# Patient Record
Sex: Female | Born: 1960 | Race: Black or African American | Hispanic: No | Smoking: Current every day smoker
Health system: Southern US, Community
[De-identification: ages and names within clinical notes are randomized; demographics above are authoritative.]

## PROBLEM LIST (undated history)

## (undated) DIAGNOSIS — M75100 Unspecified rotator cuff tear or rupture of unspecified shoulder, not specified as traumatic: Secondary | ICD-10-CM

## (undated) DIAGNOSIS — I10 Essential (primary) hypertension: Secondary | ICD-10-CM

## (undated) DIAGNOSIS — J309 Allergic rhinitis, unspecified: Secondary | ICD-10-CM

## (undated) DIAGNOSIS — I639 Cerebral infarction, unspecified: Secondary | ICD-10-CM

## (undated) DIAGNOSIS — E785 Hyperlipidemia, unspecified: Secondary | ICD-10-CM

## (undated) DIAGNOSIS — M545 Low back pain, unspecified: Secondary | ICD-10-CM

## (undated) DIAGNOSIS — M7660 Achilles tendinitis, unspecified leg: Secondary | ICD-10-CM

## (undated) DIAGNOSIS — M722 Plantar fascial fibromatosis: Secondary | ICD-10-CM

## (undated) DIAGNOSIS — M7710 Lateral epicondylitis, unspecified elbow: Secondary | ICD-10-CM

## (undated) DIAGNOSIS — J45909 Unspecified asthma, uncomplicated: Secondary | ICD-10-CM

## (undated) DIAGNOSIS — N200 Calculus of kidney: Secondary | ICD-10-CM

## (undated) DIAGNOSIS — M419 Scoliosis, unspecified: Secondary | ICD-10-CM

## (undated) DIAGNOSIS — Z87442 Personal history of urinary calculi: Secondary | ICD-10-CM

## (undated) DIAGNOSIS — F439 Reaction to severe stress, unspecified: Secondary | ICD-10-CM

## (undated) DIAGNOSIS — L309 Dermatitis, unspecified: Secondary | ICD-10-CM

## (undated) DIAGNOSIS — M5430 Sciatica, unspecified side: Secondary | ICD-10-CM

## (undated) DIAGNOSIS — N189 Chronic kidney disease, unspecified: Secondary | ICD-10-CM

## (undated) DIAGNOSIS — G459 Transient cerebral ischemic attack, unspecified: Secondary | ICD-10-CM

## (undated) DIAGNOSIS — IMO0002 Reserved for concepts with insufficient information to code with codable children: Secondary | ICD-10-CM

## (undated) DIAGNOSIS — K219 Gastro-esophageal reflux disease without esophagitis: Secondary | ICD-10-CM

## (undated) DIAGNOSIS — F32A Depression, unspecified: Secondary | ICD-10-CM

## (undated) DIAGNOSIS — F419 Anxiety disorder, unspecified: Secondary | ICD-10-CM

## (undated) DIAGNOSIS — I209 Angina pectoris, unspecified: Secondary | ICD-10-CM

## (undated) DIAGNOSIS — T7840XA Allergy, unspecified, initial encounter: Secondary | ICD-10-CM

## (undated) HISTORY — DX: Reserved for concepts with insufficient information to code with codable children: IMO0002

## (undated) HISTORY — DX: Low back pain: M54.5

## (undated) HISTORY — DX: Transient cerebral ischemic attack, unspecified: G45.9

## (undated) HISTORY — DX: Anxiety disorder, unspecified: F41.9

## (undated) HISTORY — DX: Essential (primary) hypertension: I10

## (undated) HISTORY — DX: Dermatitis, unspecified: L30.9

## (undated) HISTORY — DX: Gastro-esophageal reflux disease without esophagitis: K21.9

## (undated) HISTORY — DX: Sciatica, unspecified side: M54.30

## (undated) HISTORY — DX: Chronic kidney disease, unspecified: N18.9

## (undated) HISTORY — DX: Low back pain, unspecified: M54.50

## (undated) HISTORY — DX: Allergic rhinitis, unspecified: J30.9

## (undated) HISTORY — PX: TUBAL LIGATION: SHX77

## (undated) HISTORY — DX: Unspecified rotator cuff tear or rupture of unspecified shoulder, not specified as traumatic: M75.100

## (undated) HISTORY — DX: Depression, unspecified: F32.A

## (undated) HISTORY — DX: Calculus of kidney: N20.0

## (undated) HISTORY — DX: Unspecified asthma, uncomplicated: J45.909

## (undated) HISTORY — DX: Lateral epicondylitis, unspecified elbow: M77.10

## (undated) HISTORY — DX: Achilles tendinitis, unspecified leg: M76.60

## (undated) HISTORY — DX: Allergy, unspecified, initial encounter: T78.40XA

## (undated) HISTORY — DX: Hyperlipidemia, unspecified: E78.5

## (undated) HISTORY — DX: Scoliosis, unspecified: M41.9

## (undated) HISTORY — DX: Reaction to severe stress, unspecified: F43.9

## (undated) HISTORY — PX: KIDNEY SURGERY: SHX687

## (undated) HISTORY — DX: Plantar fascial fibromatosis: M72.2

---

## 2000-02-15 ENCOUNTER — Emergency Department (HOSPITAL_COMMUNITY): Admission: EM | Admit: 2000-02-15 | Discharge: 2000-02-15 | Payer: Self-pay | Admitting: Emergency Medicine

## 2001-12-08 ENCOUNTER — Emergency Department (HOSPITAL_COMMUNITY): Admission: EM | Admit: 2001-12-08 | Discharge: 2001-12-08 | Payer: Self-pay | Admitting: Emergency Medicine

## 2003-05-14 ENCOUNTER — Emergency Department (HOSPITAL_COMMUNITY): Admission: AD | Admit: 2003-05-14 | Discharge: 2003-05-14 | Payer: Self-pay | Admitting: Family Medicine

## 2003-11-08 ENCOUNTER — Emergency Department (HOSPITAL_COMMUNITY): Admission: EM | Admit: 2003-11-08 | Discharge: 2003-11-08 | Payer: Self-pay | Admitting: Emergency Medicine

## 2004-06-05 ENCOUNTER — Emergency Department (HOSPITAL_COMMUNITY): Admission: EM | Admit: 2004-06-05 | Discharge: 2004-06-05 | Payer: Self-pay | Admitting: Emergency Medicine

## 2005-03-05 ENCOUNTER — Emergency Department (HOSPITAL_COMMUNITY): Admission: EM | Admit: 2005-03-05 | Discharge: 2005-03-05 | Payer: Self-pay | Admitting: Emergency Medicine

## 2005-05-13 ENCOUNTER — Emergency Department (HOSPITAL_COMMUNITY): Admission: EM | Admit: 2005-05-13 | Discharge: 2005-05-13 | Payer: Self-pay | Admitting: Family Medicine

## 2006-03-29 ENCOUNTER — Emergency Department (HOSPITAL_COMMUNITY): Admission: EM | Admit: 2006-03-29 | Discharge: 2006-03-29 | Payer: Self-pay | Admitting: Family Medicine

## 2006-04-03 ENCOUNTER — Emergency Department (HOSPITAL_COMMUNITY): Admission: EM | Admit: 2006-04-03 | Discharge: 2006-04-03 | Payer: Self-pay | Admitting: Family Medicine

## 2006-04-27 ENCOUNTER — Encounter: Admission: RE | Admit: 2006-04-27 | Discharge: 2006-04-27 | Payer: Self-pay | Admitting: General Practice

## 2006-05-16 ENCOUNTER — Ambulatory Visit: Payer: Self-pay | Admitting: Gastroenterology

## 2006-06-07 ENCOUNTER — Encounter (INDEPENDENT_AMBULATORY_CARE_PROVIDER_SITE_OTHER): Payer: Self-pay | Admitting: Specialist

## 2006-06-07 ENCOUNTER — Ambulatory Visit: Payer: Self-pay | Admitting: Gastroenterology

## 2006-06-07 LAB — HM COLONOSCOPY

## 2006-07-11 ENCOUNTER — Ambulatory Visit: Payer: Self-pay | Admitting: Gastroenterology

## 2007-05-06 ENCOUNTER — Emergency Department (HOSPITAL_COMMUNITY): Admission: EM | Admit: 2007-05-06 | Discharge: 2007-05-06 | Payer: Self-pay | Admitting: Emergency Medicine

## 2007-06-15 ENCOUNTER — Ambulatory Visit (HOSPITAL_COMMUNITY): Admission: RE | Admit: 2007-06-15 | Discharge: 2007-06-15 | Payer: Self-pay | Admitting: Chiropractic Medicine

## 2007-06-28 ENCOUNTER — Encounter: Admission: RE | Admit: 2007-06-28 | Discharge: 2007-06-28 | Payer: Self-pay | Admitting: Internal Medicine

## 2007-06-28 ENCOUNTER — Ambulatory Visit (HOSPITAL_COMMUNITY): Admission: RE | Admit: 2007-06-28 | Discharge: 2007-06-28 | Payer: Self-pay | Admitting: Chiropractic Medicine

## 2007-07-12 ENCOUNTER — Emergency Department (HOSPITAL_COMMUNITY): Admission: EM | Admit: 2007-07-12 | Discharge: 2007-07-12 | Payer: Self-pay | Admitting: Emergency Medicine

## 2007-07-12 DIAGNOSIS — K219 Gastro-esophageal reflux disease without esophagitis: Secondary | ICD-10-CM | POA: Insufficient documentation

## 2007-07-12 DIAGNOSIS — J309 Allergic rhinitis, unspecified: Secondary | ICD-10-CM | POA: Insufficient documentation

## 2007-07-12 DIAGNOSIS — G8929 Other chronic pain: Secondary | ICD-10-CM | POA: Insufficient documentation

## 2007-07-12 DIAGNOSIS — M545 Low back pain: Secondary | ICD-10-CM

## 2007-07-12 DIAGNOSIS — I10 Essential (primary) hypertension: Secondary | ICD-10-CM | POA: Insufficient documentation

## 2007-07-12 DIAGNOSIS — Z87442 Personal history of urinary calculi: Secondary | ICD-10-CM | POA: Insufficient documentation

## 2007-07-17 ENCOUNTER — Emergency Department (HOSPITAL_COMMUNITY): Admission: EM | Admit: 2007-07-17 | Discharge: 2007-07-17 | Payer: Self-pay | Admitting: Emergency Medicine

## 2007-09-19 ENCOUNTER — Encounter (INDEPENDENT_AMBULATORY_CARE_PROVIDER_SITE_OTHER): Payer: Self-pay | Admitting: *Deleted

## 2007-09-19 ENCOUNTER — Ambulatory Visit: Payer: Self-pay | Admitting: Internal Medicine

## 2007-09-19 DIAGNOSIS — M79609 Pain in unspecified limb: Secondary | ICD-10-CM | POA: Insufficient documentation

## 2007-09-19 LAB — CONVERTED CEMR LAB
ALT: 17 units/L (ref 0–35)
AST: 20 units/L (ref 0–37)
Albumin: 3.9 g/dL (ref 3.5–5.2)
Alkaline Phosphatase: 100 units/L (ref 39–117)
BUN: 15 mg/dL (ref 6–23)
Basophils Absolute: 0 10*3/uL (ref 0.0–0.1)
Basophils Relative: 0.1 % (ref 0.0–3.0)
Bilirubin Urine: NEGATIVE
Bilirubin, Direct: 0.1 mg/dL (ref 0.0–0.3)
CO2: 26 meq/L (ref 19–32)
Calcium: 9.5 mg/dL (ref 8.4–10.5)
Chloride: 109 meq/L (ref 96–112)
Cholesterol: 160 mg/dL (ref 0–200)
Creatinine, Ser: 0.7 mg/dL (ref 0.4–1.2)
Eosinophils Absolute: 0.1 10*3/uL (ref 0.0–0.7)
Eosinophils Relative: 1.8 % (ref 0.0–5.0)
Folate: 14.9 ng/mL
GFR calc Af Amer: 115 mL/min
GFR calc non Af Amer: 95 mL/min
Glucose, Bld: 95 mg/dL (ref 70–99)
HCT: 40.2 % (ref 36.0–46.0)
HDL: 34.4 mg/dL — ABNORMAL LOW (ref >39.0)
Hemoglobin, Urine: NEGATIVE
Hemoglobin: 13.6 g/dL (ref 12.0–15.0)
Ketones, ur: NEGATIVE mg/dL
LDL Cholesterol: 88 mg/dL (ref 0–99)
Leukocytes, UA: NEGATIVE
Lymphocytes Relative: 41.2 % (ref 12.0–46.0)
MCHC: 33.8 g/dL (ref 30.0–36.0)
MCV: 87.7 fL (ref 78.0–100.0)
Monocytes Absolute: 0.3 10*3/uL (ref 0.1–1.0)
Monocytes Relative: 4.3 % (ref 3.0–12.0)
Neutro Abs: 3.1 10*3/uL (ref 1.4–7.7)
Neutrophils Relative %: 52.6 % (ref 43.0–77.0)
Nitrite: NEGATIVE
Platelets: 139 10*3/uL — ABNORMAL LOW (ref 150–400)
Potassium: 3.4 meq/L — ABNORMAL LOW (ref 3.5–5.1)
RBC: 4.58 M/uL (ref 3.87–5.11)
RDW: 13.7 % (ref 11.5–14.6)
Sodium: 141 meq/L (ref 135–145)
Specific Gravity, Urine: 1.015 (ref 1.000–1.03)
TSH: 0.37 microintl units/mL (ref 0.35–5.50)
Total Bilirubin: 0.5 mg/dL (ref 0.3–1.2)
Total CHOL/HDL Ratio: 4.7
Total Protein, Urine: NEGATIVE mg/dL
Total Protein: 6.9 g/dL (ref 6.0–8.3)
Triglycerides: 189 mg/dL — ABNORMAL HIGH (ref 0–149)
Urine Glucose: NEGATIVE mg/dL
Urobilinogen, UA: 0.2 (ref 0.0–1.0)
VLDL: 38 mg/dL (ref 0–40)
Vitamin B-12: 376 pg/mL (ref 211–911)
WBC: 6 10*3/uL (ref 4.5–10.5)
pH: 6 (ref 5.0–8.0)

## 2007-09-20 LAB — CONVERTED CEMR LAB: Vit D, 1,25-Dihydroxy: 29 — ABNORMAL LOW (ref 30–89)

## 2007-09-25 ENCOUNTER — Encounter: Payer: Self-pay | Admitting: Internal Medicine

## 2008-08-18 ENCOUNTER — Ambulatory Visit: Payer: Self-pay | Admitting: Internal Medicine

## 2008-08-18 DIAGNOSIS — N951 Menopausal and female climacteric states: Secondary | ICD-10-CM | POA: Insufficient documentation

## 2008-08-18 DIAGNOSIS — M722 Plantar fascial fibromatosis: Secondary | ICD-10-CM | POA: Insufficient documentation

## 2008-08-18 DIAGNOSIS — M7710 Lateral epicondylitis, unspecified elbow: Secondary | ICD-10-CM | POA: Insufficient documentation

## 2008-08-26 ENCOUNTER — Telehealth (INDEPENDENT_AMBULATORY_CARE_PROVIDER_SITE_OTHER): Payer: Self-pay | Admitting: *Deleted

## 2008-09-01 ENCOUNTER — Ambulatory Visit: Payer: Self-pay | Admitting: Internal Medicine

## 2008-09-01 DIAGNOSIS — M75100 Unspecified rotator cuff tear or rupture of unspecified shoulder, not specified as traumatic: Secondary | ICD-10-CM | POA: Insufficient documentation

## 2008-09-01 DIAGNOSIS — M7660 Achilles tendinitis, unspecified leg: Secondary | ICD-10-CM | POA: Insufficient documentation

## 2008-09-01 DIAGNOSIS — IMO0002 Reserved for concepts with insufficient information to code with codable children: Secondary | ICD-10-CM | POA: Insufficient documentation

## 2008-09-10 ENCOUNTER — Ambulatory Visit: Payer: Self-pay | Admitting: Internal Medicine

## 2008-09-10 DIAGNOSIS — N760 Acute vaginitis: Secondary | ICD-10-CM | POA: Insufficient documentation

## 2008-09-10 DIAGNOSIS — M25476 Effusion, unspecified foot: Secondary | ICD-10-CM

## 2008-09-10 DIAGNOSIS — M25529 Pain in unspecified elbow: Secondary | ICD-10-CM | POA: Insufficient documentation

## 2008-09-10 DIAGNOSIS — M25473 Effusion, unspecified ankle: Secondary | ICD-10-CM | POA: Insufficient documentation

## 2008-09-10 LAB — CONVERTED CEMR LAB
ALT: 19 units/L (ref 0–35)
AST: 17 units/L (ref 0–37)
Albumin: 3.9 g/dL (ref 3.5–5.2)
Alkaline Phosphatase: 95 units/L (ref 39–117)
BUN: 16 mg/dL (ref 6–23)
Basophils Absolute: 0.1 10*3/uL (ref 0.0–0.1)
Basophils Relative: 1.1 % (ref 0.0–3.0)
Bilirubin Urine: NEGATIVE
Bilirubin, Direct: 0.1 mg/dL (ref 0.0–0.3)
CO2: 29 meq/L (ref 19–32)
Calcium: 9.2 mg/dL (ref 8.4–10.5)
Chloride: 107 meq/L (ref 96–112)
Cholesterol: 167 mg/dL (ref 0–200)
Creatinine, Ser: 0.7 mg/dL (ref 0.4–1.2)
Eosinophils Absolute: 0.2 10*3/uL (ref 0.0–0.7)
Eosinophils Relative: 2.8 % (ref 0.0–5.0)
GFR calc non Af Amer: 114.52 mL/min (ref >60)
Glucose, Bld: 74 mg/dL (ref 70–99)
HCT: 44.3 % (ref 36.0–46.0)
HDL: 46.9 mg/dL (ref >39.00)
Hemoglobin, Urine: NEGATIVE
Hemoglobin: 14.7 g/dL (ref 12.0–15.0)
Ketones, ur: NEGATIVE mg/dL
LDL Cholesterol: 93 mg/dL (ref 0–99)
Leukocytes, UA: NEGATIVE
Lymphocytes Relative: 45.8 % (ref 12.0–46.0)
Lymphs Abs: 2.9 10*3/uL (ref 0.7–4.0)
MCHC: 33.1 g/dL (ref 30.0–36.0)
MCV: 89.9 fL (ref 78.0–100.0)
Monocytes Absolute: 0.7 10*3/uL (ref 0.1–1.0)
Monocytes Relative: 11.2 % (ref 3.0–12.0)
Neutro Abs: 2.5 10*3/uL (ref 1.4–7.7)
Neutrophils Relative %: 39.1 % — ABNORMAL LOW (ref 43.0–77.0)
Nitrite: NEGATIVE
Platelets: 112 10*3/uL — ABNORMAL LOW (ref 150.0–400.0)
Potassium: 4.1 meq/L (ref 3.5–5.1)
RBC: 4.92 M/uL (ref 3.87–5.11)
RDW: 14.9 % — ABNORMAL HIGH (ref 11.5–14.6)
Rheumatoid fact SerPl-aCnc: <20 intl units/mL (ref 0.0–20.0)
Sed Rate: 4 mm/hr (ref 0–22)
Sodium: 141 meq/L (ref 135–145)
Specific Gravity, Urine: >=1.03 (ref 1.000–1.030)
TSH: 1.02 microintl units/mL (ref 0.35–5.50)
Total Bilirubin: 0.9 mg/dL (ref 0.3–1.2)
Total CHOL/HDL Ratio: 4
Total Protein, Urine: NEGATIVE mg/dL
Total Protein: 7 g/dL (ref 6.0–8.3)
Triglycerides: 134 mg/dL (ref 0.0–149.0)
Urine Glucose: NEGATIVE mg/dL
Urobilinogen, UA: 0.2 (ref 0.0–1.0)
VLDL: 26.8 mg/dL (ref 0.0–40.0)
WBC: 6.4 10*3/uL (ref 4.5–10.5)
pH: 5 (ref 5.0–8.0)

## 2008-09-16 LAB — CONVERTED CEMR LAB
Anti Nuclear Antibody(ANA): NEGATIVE
Vit D, 25-Hydroxy: 28 ng/mL — ABNORMAL LOW (ref 30–89)

## 2008-10-15 ENCOUNTER — Telehealth: Payer: Self-pay | Admitting: Internal Medicine

## 2008-10-19 ENCOUNTER — Ambulatory Visit: Payer: Self-pay | Admitting: Internal Medicine

## 2008-10-19 DIAGNOSIS — N390 Urinary tract infection, site not specified: Secondary | ICD-10-CM | POA: Insufficient documentation

## 2008-10-19 DIAGNOSIS — M25579 Pain in unspecified ankle and joints of unspecified foot: Secondary | ICD-10-CM | POA: Insufficient documentation

## 2008-10-24 ENCOUNTER — Emergency Department (HOSPITAL_COMMUNITY): Admission: EM | Admit: 2008-10-24 | Discharge: 2008-10-24 | Payer: Self-pay | Admitting: Emergency Medicine

## 2008-10-29 ENCOUNTER — Telehealth: Payer: Self-pay | Admitting: Internal Medicine

## 2008-11-10 ENCOUNTER — Telehealth: Payer: Self-pay | Admitting: Internal Medicine

## 2008-11-11 ENCOUNTER — Ambulatory Visit: Payer: Self-pay | Admitting: Internal Medicine

## 2008-11-17 ENCOUNTER — Encounter: Payer: Self-pay | Admitting: Internal Medicine

## 2008-11-27 ENCOUNTER — Emergency Department (HOSPITAL_COMMUNITY): Admission: EM | Admit: 2008-11-27 | Discharge: 2008-11-27 | Payer: Self-pay | Admitting: Emergency Medicine

## 2009-02-07 ENCOUNTER — Emergency Department (HOSPITAL_COMMUNITY): Admission: EM | Admit: 2009-02-07 | Discharge: 2009-02-07 | Payer: Self-pay | Admitting: Emergency Medicine

## 2009-10-31 ENCOUNTER — Emergency Department (HOSPITAL_COMMUNITY): Admission: EM | Admit: 2009-10-31 | Discharge: 2009-10-31 | Payer: Self-pay | Admitting: Family Medicine

## 2009-11-01 ENCOUNTER — Emergency Department (HOSPITAL_COMMUNITY): Admission: EM | Admit: 2009-11-01 | Discharge: 2009-11-01 | Payer: Self-pay | Admitting: Emergency Medicine

## 2009-11-02 ENCOUNTER — Emergency Department (HOSPITAL_COMMUNITY): Admission: EM | Admit: 2009-11-02 | Discharge: 2009-11-02 | Payer: Self-pay | Admitting: Emergency Medicine

## 2009-11-03 ENCOUNTER — Encounter: Payer: Self-pay | Admitting: Internal Medicine

## 2009-11-08 ENCOUNTER — Ambulatory Visit: Payer: Self-pay | Admitting: Internal Medicine

## 2009-11-08 ENCOUNTER — Encounter (INDEPENDENT_AMBULATORY_CARE_PROVIDER_SITE_OTHER): Payer: Self-pay | Admitting: *Deleted

## 2009-11-08 DIAGNOSIS — N200 Calculus of kidney: Secondary | ICD-10-CM | POA: Insufficient documentation

## 2009-11-08 DIAGNOSIS — R350 Frequency of micturition: Secondary | ICD-10-CM | POA: Insufficient documentation

## 2009-11-09 ENCOUNTER — Encounter: Payer: Self-pay | Admitting: Internal Medicine

## 2009-11-09 LAB — CONVERTED CEMR LAB
Bilirubin Urine: NEGATIVE
Ketones, ur: NEGATIVE mg/dL
Nitrite: NEGATIVE
Specific Gravity, Urine: 1.01 (ref 1.000–1.030)
Total Protein, Urine: NEGATIVE mg/dL
Urine Glucose: NEGATIVE mg/dL
Urobilinogen, UA: 0.2 (ref 0.0–1.0)
pH: 6.5 (ref 5.0–8.0)

## 2009-11-10 ENCOUNTER — Encounter (INDEPENDENT_AMBULATORY_CARE_PROVIDER_SITE_OTHER): Payer: Self-pay | Admitting: *Deleted

## 2009-11-16 ENCOUNTER — Telehealth: Payer: Self-pay | Admitting: Gastroenterology

## 2009-11-17 ENCOUNTER — Encounter: Payer: Self-pay | Admitting: Internal Medicine

## 2009-11-19 ENCOUNTER — Ambulatory Visit (HOSPITAL_BASED_OUTPATIENT_CLINIC_OR_DEPARTMENT_OTHER): Admission: RE | Admit: 2009-11-19 | Discharge: 2009-11-19 | Payer: Self-pay | Admitting: Urology

## 2009-11-21 ENCOUNTER — Emergency Department (HOSPITAL_COMMUNITY): Admission: EM | Admit: 2009-11-21 | Discharge: 2009-11-21 | Payer: Self-pay | Admitting: Emergency Medicine

## 2009-11-22 ENCOUNTER — Telehealth: Payer: Self-pay | Admitting: Internal Medicine

## 2009-11-23 ENCOUNTER — Encounter: Payer: Self-pay | Admitting: Gastroenterology

## 2009-11-23 ENCOUNTER — Telehealth: Payer: Self-pay | Admitting: Gastroenterology

## 2009-11-27 ENCOUNTER — Emergency Department (HOSPITAL_COMMUNITY): Admission: EM | Admit: 2009-11-27 | Discharge: 2009-11-27 | Payer: Self-pay | Admitting: Emergency Medicine

## 2010-01-05 ENCOUNTER — Ambulatory Visit: Payer: Self-pay | Admitting: Internal Medicine

## 2010-01-05 DIAGNOSIS — R300 Dysuria: Secondary | ICD-10-CM | POA: Insufficient documentation

## 2010-01-05 LAB — CONVERTED CEMR LAB
Bilirubin Urine: NEGATIVE
Glucose, Urine, Semiquant: NEGATIVE
Ketones, urine, test strip: NEGATIVE
Nitrite: NEGATIVE
Protein, U semiquant: NEGATIVE
Specific Gravity, Urine: 1.015
Urobilinogen, UA: 0.2
WBC Urine, dipstick: NEGATIVE
pH: 5

## 2010-03-29 NOTE — Progress Notes (Signed)
Summary: Triage   Phone Note Call from Patient Call back at Home Phone [phone]   Caller: Patient Call For: Dr. Russella Dar Summary of Call: Low back pain, constipation, nauseated x1 mth Initial call taken by: Karna Christmas,  November 16, 2009 9:16 AM  Follow-up for Phone Call        Patient  taking oxycodone q 4-6 hours due to kidney stones, she has follow up with her Urologist in the next few weeks to decide if she will need surgery due to the size of the 2 stones.  She is having constipation and lower abdominal discomfort since being on oxycodone.  She was started on Lactulose by Dr Jonny Ruiz for constipation and it gives her minimal relief.  She is advised that she can try Miralax 1-3 times a day as needed. She will call back once she is off oxycodone if her constiptation persists for a follow up appointment with Dr Russella Dar. Follow-up by: Darcey Nora RN, CGRN,  November 16, 2009 9:30 AM  Additional Follow-up for Phone Call Additional follow up Details #1::        Agree with above. Additional Follow-up by: Meryl Dare MD Clementeen Graham,  November 16, 2009 8:15 PM

## 2010-03-29 NOTE — Assessment & Plan Note (Signed)
Summary: 2 wk f/u $50/cd   Vital Signs:  Patient profile:   50 year old female Height:      62 inches Weight:      124.13 pounds BMI:     22.79 O2 Sat:      98 % Temp:     98.9 degrees F oral Pulse rate:   87 / minute BP sitting:   150 / 106  (right arm) Cuff size:   regular  Vitals Entered By: Windell Norfolk (September 01, 2008 11:19 AM) CC: 2 week f/u on foot   CC:  2 week f/u on foot.  History of Present Illness: here unfortulately as the meloxicam helped somewhat, but just not enough as she has persistent pain to the right heel, right achilles insertion site at the heel, and also today to the RUE , both at the shoulder and the elbow; again denies overuse, fever , trauma or significant swelling today  Problems Prior to Update: 1)  Pain in Joint, Upper Arm  (ICD-719.42) 2)  Joint Effusion, Ankle  (ICD-719.07) 3)  Preventive Health Care  (ICD-V70.0) 4)  Vaginitis  (ICD-616.10) 5)  Achilles Tendinitis  (ICD-726.71) 6)  Subacromial Bursitis, Right  (ICD-726.19) 7)  Hot Flashes  (ICD-627.2) 8)  Plantar Fasciitis, Right  (ICD-728.71) 9)  Lateral Epicondylitis, Right  (ICD-726.32) 10)  Preventive Health Care  (ICD-V70.0) 11)  Foot Pain, Bilateral  (ICD-729.5) 12)  Arm Pain, Right  (ICD-729.5) 13)  Colonic Polyps, Hx of  (ICD-V12.72) 14)  Family History Diabetes 1st Degree Relative  (ICD-V18.0) 15)  Family History Breast Cancer 1st Degree Relative <50  (ICD-V16.3) 16)  Low Back Pain  (ICD-724.2) 17)  Nephrolithiasis, Hx of  (ICD-V13.01) 18)  Hypertension  (ICD-401.9) 19)  Gerd  (ICD-530.81) 20)  Allergic Rhinitis  (ICD-477.9)  Medications Prior to Update: 1)  Lisinopril 20 Mg  Tabs (Lisinopril) .... Take 1 Tablet By Mouth Once A Day 2)  Meloxicam 15 Mg Tabs (Meloxicam) .Marland Kitchen.. 1 By Mouth Once Daily As Needed Pain 3)  Cetirizine Hcl 10 Mg Tabs (Cetirizine Hcl) .Marland Kitchen.. 1 By Mouth Once Daily As Needed 4)  Sertraline Hcl 50 Mg Tabs (Sertraline Hcl) .Marland Kitchen.. 1 By Mouth Once Daily  Current  Medications (verified): 1)  Lisinopril 20 Mg  Tabs (Lisinopril) .... Take 1 Tablet By Mouth Once A Day 2)  Cetirizine Hcl 10 Mg Tabs (Cetirizine Hcl) .Marland Kitchen.. 1 By Mouth Once Daily As Needed 3)  Sertraline Hcl 50 Mg Tabs (Sertraline Hcl) .Marland Kitchen.. 1 By Mouth Once Daily 4)  Prednisone 10 Mg Tabs (Prednisone) .... 3po Qd For 3days, Then 2po Qd For 3days, Then 1po Qd For 3days, Then Stop 5)  Tramadol Hcl 50 Mg Tabs (Tramadol Hcl) .Marland Kitchen.. 1 - 2 By Mouth Four Times Per Day As Needed  Allergies (verified): 1)  ! 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   Head:  normocephalic and atraumatic.   Eyes:  vision grossly intact and pupils reactive to light.   Ears:  R ear normal and L ear normal.  Nose:  no external deformity and no nasal discharge.   Mouth:  no gingival abnormalities and pharynx pink and moist.   Neck:  supple and no masses.   Lungs:  normal respiratory effort and normal breath sounds.   Heart:  normal rate and regular rhythm.   Msk:  has specific mod tenderness to the right subacromail bursa area with impingement sign on the right, mild to mod tenderness to the right lateral epicondylar area without sweling/erythema and has FROM; also tender mild to the right achilles insertion site without significant soft tissue or other sweling to the area, and also tender to the right plantar heel again without erythem, sweling or ulcer Extremities:  no edema, no ulcers  Neurologic:  cranial nerves II-XII intact  and strength normal in all extremities., o/w not done in detail   Impression & Recommendations:  Problem # 1:  SUBACROMIAL BURSITIS, RIGHT (ICD-726.19)  overall moderate as are the rest of the problems, but mobic 15 mg not helping; will sotp this, tx wtih prednisone burst and taper, ultram as needed , and refer ortho - [facility]  Orders: Orthopedic Surgeon Referral (Ortho Surgeon)  Problem # 2:  LATERAL EPICONDYLITIS, RIGHT (ICD-726.32) treat as above, f/u any worsening signs or symptoms  Orders: Orthopedic Surgeon Referral (Ortho Surgeon)  Problem # 3:  PLANTAR FASCIITIS, RIGHT (ICD-728.71)  The following medications were removed from the medication list:    Meloxicam 15 Mg Tabs (Meloxicam) .Marland Kitchen... 1 by mouth once daily as needed pain  Orders: Orthopedic Surgeon Referral (Ortho Surgeon) treat as above, f/u any worsening signs or symptoms, consider podiatry but should be ok with ortho  Problem # 4:  ACHILLES TENDINITIS (ICD-726.71) treat as above, f/u any worsening signs or symptoms , again d'/w pt this usually occurs in overuse problems and should consider avoiaing standing or walking for long periods for the next 2 to 4 wks Orders: Orthopedic Surgeon Referral (Ortho Surgeon)  Complete Medication List: 1)  Lisinopril 20 Mg Tabs (Lisinopril) .... Take 1 tablet by mouth once a day 2)  Cetirizine Hcl 10 Mg Tabs (Cetirizine hcl) .Marland Kitchen.. 1 by mouth once daily as needed 3)  Sertraline Hcl 50 Mg Tabs (Sertraline hcl) .Marland Kitchen.. 1 by mouth once daily 4)  Prednisone 10 Mg Tabs (Prednisone) .... 3po qd for 3days, then 2po qd for 3days, then 1po qd for 3days, then stop 5)  Tramadol Hcl 50 Mg Tabs (Tramadol hcl) .Marland Kitchen.. 1 - 2 by mouth four times per day as needed  Patient Instructions: 1)  stop the meloxicam 2)  Please take all new medications as prescribed - the ultram and the prednisone 3)  You will be contacted about the referral(s) to: Orthopedic 4)  Continue all medications that you may have been  taking previously  5)  Please schedule a follow-up appointment as needed. Prescriptions: LISINOPRIL 20 MG  TABS (LISINOPRIL) Take 1 tablet by mouth once a day  #90 x 3   Entered by:   Windell Norfolk   Authorized by:   Corwin Levins MD   Signed by:   Windell Norfolk on 09/01/2008   Method used:   Electronically to        [facility] (retail)       [address]       [place], [place]  [address]       Ph: [phone]       Fax: [phone]   RxID:  [id number] TRAMADOL HCL 50 MG TABS (TRAMADOL HCL) 1 - 2 by mouth four times per day as needed  #60 x 2   Entered and Authorized by:   Corwin Levins MD   Signed by:   Corwin Levins MD on 09/01/2008   Method used:   Print then Give to Patient   RxID:   [id number] PREDNISONE 10 MG TABS (PREDNISONE) 3po qd for 3days, then 2po qd for 3days, then 1po qd for 3days, then stop  #18 x 0   Entered and Authorized by:   Corwin Levins MD   Signed by:   Corwin Levins MD on 09/01/2008   Method used:   Print then Give to Patient   RxID:   [id number]

## 2010-03-29 NOTE — Assessment & Plan Note (Signed)
Summary: POST ER VISIT/ KIDNEY STONES? /NWS   Vital Signs:  Patient profile:   50 year old female Height:      62 inches Weight:      120.25 pounds BMI:     22.07 O2 Sat:      97 % on Room air Temp:     98.3 degrees F oral Pulse rate:   78 / minute BP sitting:   130 / 92  (left arm) Cuff size:   regular  Vitals Entered By: Zella Ball Ewing CMA Duncan Dull) (November 08, 2009 3:23 PM)  O2 Flow:  Room air CC: Post ER/RE   CC:  Post ER/RE.  History of Present Illness: here with family, c/o ongoing right flank pain and nausea that persist, constant with fair to good control with current meds  after recent ER evaluation with finding of right renal stones x 2 - one 7 mm and one 6 mm apparentlly unable to pass;  has been seen in f/u per urology with f/u sched for october and med tx given for now;  recent imaging without hydronephrosis.  Pt simply unable to work with the ongoing pain and nausea and family finances depend on her getting back to work.  Also with ongoing constipation adn abd discomfort but not worsening, but  ongoing  despite stool softner, without blood, vomiting, fever or increasing abd pain.  C/o urinary frequency ongoing no change as well, I note urine cx neg sept 5 per hospital echart.  Pt denies CP, worsening sob, doe, wheezing, orthopnea, pnd, worsening LE edema, palps, dizziness or syncope  Pt denies new neuro symptoms such as headache, facial or extremity weakness No  wt loss, night sweats,  or other constitutional symptoms except has some loss of appetitie.  Problems Prior to Update: 1)  Frequency, Urinary  (ICD-788.41) 2)  Plantar Fasciitis, Bilateral  (ICD-728.71) 3)  Neoplasm, Malignant, Colon, Family Hx, Sibling  (ICD-V16.0) 4)  Uti  (ICD-599.0) 5)  Ankle Pain, Bilateral  (ICD-719.47) 6)  Pain in Joint, Upper Arm  (ICD-719.42) 7)  Joint Effusion, Ankle  (ICD-719.07) 8)  Preventive Health Care  (ICD-V70.0) 9)  Vaginitis  (ICD-616.10) 10)  Achilles Tendinitis   (ICD-726.71) 11)  Subacromial Bursitis, Right  (ICD-726.19) 12)  Hot Flashes  (ICD-627.2) 13)  Plantar Fasciitis, Right  (ICD-728.71) 14)  Lateral Epicondylitis, Right  (ICD-726.32) 15)  Preventive Health Care  (ICD-V70.0) 16)  Foot Pain, Bilateral  (ICD-729.5) 17)  Arm Pain, Right  (ICD-729.5) 18)  Colonic Polyps, Hx of  (ICD-V12.72) 19)  Family History Diabetes 1st Degree Relative  (ICD-V18.0) 20)  Family History Breast Cancer 1st Degree Relative <50  (ICD-V16.3) 21)  Low Back Pain  (ICD-724.2) 22)  Nephrolithiasis, Hx of  (ICD-V13.01) 23)  Hypertension  (ICD-401.9) 24)  Gerd  (ICD-530.81) 25)  Allergic Rhinitis  (ICD-477.9)  Medications Prior to Update: 1)  Lisinopril 20 Mg  Tabs (Lisinopril) .... Take 1 Tablet By Mouth Once A Day 2)  Cetirizine Hcl 10 Mg Tabs (Cetirizine Hcl) .Marland Kitchen.. 1 By Mouth Once Daily As Needed 3)  Sertraline Hcl 50 Mg Tabs (Sertraline Hcl) .Marland Kitchen.. 1 By Mouth Once Daily 4)  Tramadol Hcl 50 Mg Tabs (Tramadol Hcl) .Marland Kitchen.. 1 - 2 By Mouth Q 6 Hrs As Needed 5)  Nicotrol 10 Mg Inha (Nicotine) .... Use Asd  Current Medications (verified): 1)  Lisinopril 20 Mg  Tabs (Lisinopril) .... Take 1 Tablet By Mouth Once A Day 2)  Cetirizine Hcl 10 Mg Tabs (Cetirizine Hcl) .Marland KitchenMarland KitchenMarland Kitchen  1 By Mouth Once Daily As Needed 3)  Sertraline Hcl 50 Mg Tabs (Sertraline Hcl) .Marland Kitchen.. 1 By Mouth Once Daily 4)  Tramadol Hcl 50 Mg Tabs (Tramadol Hcl) .Marland Kitchen.. 1 - 2 By Mouth Q 6 Hrs As Needed 5)  Nicotrol 10 Mg Inha (Nicotine) .... Use Asd 6)  Oxycodone Hcl 5 Mg Caps (Oxycodone Hcl) .Marland Kitchen.. 1-2 By Mouth Q 6 Hrs As Needed Pain 7)  Promethazine Hcl 25 Mg Tabs (Promethazine Hcl) .Marland Kitchen.. 1po Q 6 Hrs As Needed Nausea 8)  Lactulose  Soln (Lactulose) .... 30 - 60 Cc By Mouth Two Times A Day As Needed Constipation  Allergies (verified): 1)  ! 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   Head:  normocephalic and atraumatic.   Eyes:  vision grossly intact, pupils equal, and pupils round.   Ears:  R ear normal and L ear normal.   Nose:  no external deformity and no nasal discharge.   Mouth:  no gingival abnormalities and pharynx pink and moist.   Neck:  supple and no masses.   Lungs:  normal respiratory effort and normal breath sounds.   Heart:  normal rate and regular rhythm.   Abdomen:  soft, non-tender, normal bowel sounds, no distention, no masses, and no guarding.   Msk:  no joint tenderness and no joint swelling.  , has ? right flank tender Extremities:  no edema, no erythema    Impression & Recommendations:  Problem # 1:  NEPHROLITHIASIS (ICD-592.0) d/w pt - pain most liekly related to non-passing stones -  Continue all previous medications as before this visit, needs to f/u with urology and i would call to request earlier appt than planned  Problem # 2:  FREQUENCY, URINARY (ICD-788.41) recent cx neg;  but posible to have ? uti - to check urine studies Orders: T-Culture, Urine ([id number]) TLB-Udip w/ Micro (81001-URINE)  Problem # 3:  ABDOMINAL PAIN, GENERALIZED (ICD-789.07) prob stone related, need to r/o UTI , but also for as needed lactulose for constipation,  Problem # 4:  HYPERTENSION (ICD-401.9)  Her updated medication list for this problem includes:    Lisinopril 20 Mg Tabs (Lisinopril) .Marland Kitchen... Take 1 tablet by mouth once a day  BP today: 130/92 Prior BP: 128/96 (11/11/2008)  Labs Reviewed: K+: 4.1 (09/10/2008) Creat: : 0.7 (09/10/2008)   Chol: 167 (09/10/2008)   HDL:  46.90 (09/10/2008)   LDL: 93 (09/10/2008)   TG: 134.0 (09/10/2008)  Complete Medication List: 1)  Lisinopril 20 Mg Tabs (Lisinopril) .... Take 1 tablet by mouth once a day 2)  Cetirizine Hcl 10 Mg Tabs (Cetirizine hcl) .Marland Kitchen.. 1 by mouth once daily as needed 3)  Sertraline Hcl 50 Mg Tabs (Sertraline hcl) .Marland Kitchen.. 1 by mouth once daily 4)  Tramadol Hcl 50 Mg Tabs (Tramadol hcl) .Marland Kitchen.. 1 - 2 by mouth q 6 hrs as needed 5)  Nicotrol 10 Mg Inha (Nicotine) .... Use asd 6)  Oxycodone Hcl 5 Mg Caps (Oxycodone hcl) .Marland Kitchen.. 1-2 by mouth q 6  hrs as needed pain 7)  Promethazine Hcl 25 Mg Tabs (Promethazine hcl) .Marland Kitchen.. 1po q 6 hrs as needed nausea 8)  Lactulose Soln (Lactulose) .... 30 - 60 cc by mouth two times a day as needed constipation 9)  Ciprofloxacin Hcl 500 Mg Tabs (Ciprofloxacin hcl) .Marland Kitchen.. 1 by mouth two times a day  Patient Instructions: 1)  Please take all new medications as prescribed 2)  Continue all previous medications as before this visit  3)  you are given the work note 4)  Please go to the Lab in the basement for your blood and/or urine tests today  5)  please call for followup with urology 6)  Please schedule a follow-up appointment as needed. Prescriptions: LACTULOSE  SOLN (LACTULOSE) 30 - 60 cc by mouth two times a day as needed constipation  #1 bottle x 1   Entered and Authorized by:   Corwin Levins MD   Signed by:   Corwin Levins MD on 11/08/2009   Method used:   Print then Give to Patient   RxID:   [id number] LISINOPRIL 20 MG  TABS (LISINOPRIL) Take 1 tablet by mouth once a day  #90 x 3   Entered and Authorized by:   Corwin Levins MD   Signed by:   Corwin Levins MD on 11/08/2009   Method used:   Print then Give to Patient   RxID:   [id number] PROMETHAZINE HCL 25 MG TABS (PROMETHAZINE HCL) 1po q 6 hrs as needed nausea  #40 x 1   Entered and Authorized by:   Corwin Levins MD   Signed by:   Corwin Levins MD on 11/08/2009   Method used:   Print then Give to Patient   RxID:    [id number] OXYCODONE HCL 5 MG CAPS (OXYCODONE HCL) 1-2 by mouth q 6 hrs as needed pain  #60 x 0   Entered and Authorized by:   Corwin Levins MD   Signed by:   Corwin Levins MD on 11/08/2009   Method used:   Print then Give to Patient   RxID:   [id number]
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
## 2010-03-29 NOTE — Progress Notes (Signed)
Summary: FYI  Phone Note Call from Patient   Caller: Patient Summary of Call: pt called stating she is syill have problems with her legs and ankles. I have advise pt to make an appt, which she will for Friday with Dr. Jonny Ruiz Initial call taken by: Margaret Pyle, CMA,  November 10, 2008 11:05 AM  Follow-up for Phone Call        noted Follow-up by: Corwin Levins MD,  November 10, 2008 12:32 PM

## 2010-03-29 NOTE — Procedures (Signed)
Summary: EGD   EGD  Procedure date:  06/07/2006  Findings:      Location: [facility]   Patient Name: [name] Walker, [name] Walker MRN:  Procedure Procedures: Panendoscopy (EGD) CPT: 43235.  Personnel: Endoscopist: Venita Lick. Russella Dar, MD, Clementeen Graham.  Referred By: Olene Craven, MD.  Exam Location: Exam performed in Outpatient Clinic. Outpatient  Patient Consent: Procedure, Alternatives, Risks and Benefits discussed, consent obtained, from patient. Consent was obtained by the RN.  Indications Symptoms: Reflux symptoms  History  Current Medications: Patient is not currently taking Coumadin.  Pre-Exam Physical: Performed Jun 07, 2006  Cardio-pulmonary exam, HEENT exam, Abdominal exam, Mental status exam WNL.  Comments: Pt. history reviewed/updated, physical exam performed prior to initiation of sedation?Yes Exam Exam Info: Maximum depth of insertion Duodenum, intended Duodenum. Vocal cords not visualized. Gastric retroflexion performed. ASA Classification: II. Tolerance: excellent.  Sedation Meds: Patient assessed and found to be appropriate for moderate (conscious) sedation. Residual sedation present from prior procedure today. Cetacaine Spray 2 sprays given aerosolized. Versed 2 mg. given IV.  Monitoring: BP and pulse monitoring done. Oximetry used. Supplemental O2 given  Findings Normal: Proximal Esophagus to Mid Esophagus.  ESOPHAGEAL INFLAMMATION: Severity is moderate, erosions present.  [place] Classification: Grade A. ICD9: Esophagitis, Reflux: 530.11.  Normal: Cardia to Duodenal 2nd Portion.   Assessment  Diagnoses: 530.11: Esophagitis, Reflux.   Events  Unplanned Intervention: No unplanned interventions were required.  Unplanned Events: There were no complications. Plans Medication(s): PPI: Aciphex 20 mg QAM,   Patient Education: Patient given standard instructions for: Reflux.  Disposition: After procedure patient sent to recovery.  After recovery patient sent home.  Scheduling: Office Visit, to [employer]. Russella Dar, MD, [facility], around Jul 21, 2006.  Primary Care Gloyd Happ, to Olene Craven, MD, as planned    cc: Kern Reap, MD   This report was created from the original endoscopy report, which was reviewed and signed by the above listed endoscopist.

## 2010-03-29 NOTE — Progress Notes (Signed)
Summary: bowel movements  Phone Note Call from Patient   Caller: Patient Reason for Call: Talk to Nurse Summary of Call: pt is still unable to have a bowel movement, having a lot of pressure Initial call taken by: Migdalia Dk,  November 22, 2009 10:15 AM  Follow-up for Phone Call        Pt already advised mIralax by GI MD. Please advise further Margaret Pyle, CMA  November 22, 2009 11:10 AM   Additional Follow-up for Phone Call Additional follow up Details #1::        ok to try OTC glycerin suppository OR fleets enema for more immediate relief Additional Follow-up by: Corwin Levins MD,  November 22, 2009 1:15 PM    Additional Follow-up for Phone Call Additional follow up Details #2::    left message on machine for pt to return my call Margaret Pyle, CMA  November 22, 2009 1:25 PM   Pt advised. Follow-up by: Margaret Pyle, CMA,  November 23, 2009 8:18 AM

## 2010-03-29 NOTE — Progress Notes (Signed)
  Phone Note Call from Patient Call back at Home Phone [phone]   Caller: Patient Call For: Dr Jonny Ruiz Summary of Call: Pt had steriod shot for ankles and arms at [facility] emergency room. FYI. Initial call taken by: Verdell Face,  October 29, 2008 10:53 AM  Follow-up for Phone Call        noted Follow-up by: Corwin Levins MD,  October 29, 2008 12:44 PM

## 2010-03-29 NOTE — Letter (Signed)
Summary: [facility]  [facility]   Imported By: Sherian Rein 11/08/2009 14:54:03  _____________________________________________________________________  External Attachment:    Type:   Image     Comment:   External Document

## 2010-03-29 NOTE — Letter (Signed)
Summary: Work letter/[employer]  Work letter/[employer]   Imported By: Lester [place] 11/24/2009 08:53:14  _____________________________________________________________________  External Attachment:    Type:   Image     Comment:   External Document

## 2010-03-29 NOTE — Progress Notes (Signed)
Summary: Still backed up.   Phone Note Call from Patient Call back at Home Phone [phone]   Call For: Dr Russella Dar Summary of Call: Is still backed up-her stomach hurts. Initial call taken by: Leanor Kail [facility],  November 23, 2009 8:34 AM  Follow-up for Phone Call        Patient continutes to c/o constipation.  She is taking lactulose and Miralax both two times a day.  She now has liquid stool and is concerned because she has no formed stool.  Advised to titrate as needed.  Concerned that she has large polyps obstructing her colon and she would like to schedule a REV to discuss.  She recently had surgery to remove several large kidney stones and is still on narcotics several times a day.  Reviewed with the patient most likely cause of her constipation is narcotic use , but she has been scheduled for an office visit 12/29/09 10:15 to discuss further.  Last colon was 08 with 2 small polyps.  Recall date is 05/2016 in [facility].   Follow-up by: Darcey Nora RN, CGRN,  November 23, 2009 9:01 AM

## 2010-03-29 NOTE — Letter (Signed)
Summary: Out of Work  [facility]  [address] [place], [place] [address]   Phone: [phone]  Fax: [phone]    November 11, 2008   Employee:  Nancyjo S Soileau    To Whom It May Concern:   For Medical reasons, please excuse the above named employee from work for the following dates:  Start:   sept 15, 2010  End:   sept 26, 2010   -- to return to work sept 27, 2010 without restriction  If you need additional information, please feel free to contact our office.         Sincerely,    Corwin Levins MD

## 2010-03-29 NOTE — Letter (Signed)
Summary: New Patient letter  [facility]  [address] [place], [place] [address]   Phone: [phone]  Fax: [phone]       11/23/2009 MRN: [id number]  [facility] [address] [place], [place]  [address]  Dear [name] Walker,  Welcome to the Gastroenterology Division at [facility].    You are scheduled to see Dr.  Russella Dar on 12/29/09 at 10:15 a.m.  on the 3rd floor at [facility], [address]  We ask that you try to arrive at our office 15 minutes prior to your appointment time to allow for check-in.  We would like you to complete the enclosed self-administered evaluation form prior to your visit and bring it with you on the day of your appointment.  We will review it with you.  Also, please bring a complete list of all your medications or, if you prefer, bring the medication bottles and we will list them.  Please bring your insurance card so that we may make a copy of it.  If your insurance requires a referral to see a specialist, please bring your referral form from your primary care physician.  Co-payments are due at the time of your visit and may be paid by cash, check or credit card.     Your office visit will consist of a consult with your physician (includes a physical exam), any laboratory testing he/she may order, scheduling of any necessary diagnostic testing (e.g. x-ray, ultrasound, CT-scan), and scheduling of a procedure (e.g. Endoscopy, Colonoscopy) if required.  Please allow enough time on your schedule to allow for any/all of these possibilities.    If you cannot keep your appointment, please call [phone] to cancel or reschedule prior to your appointment date.  This allows [place] the opportunity to schedule an appointment for another patient in need of care.  If you do not cancel or reschedule by 5 p.m. the business day prior to your appointment date, you will be charged a $50.00 late cancellation/no-show fee.    Thank you for choosing  [facility] for your medical needs.  We appreciate the opportunity to care for you.  Please visit [place] at our website  to learn more about our practice.                     Sincerely,                                                             The Gastroenterology Division

## 2010-03-29 NOTE — Assessment & Plan Note (Signed)
Summary: BLADDER INFECTION/NWS   Vital Signs:  Patient profile:   50 year old female Height:      62 inches Weight:      121.25 pounds BMI:     22.26 O2 Sat:      98 % on Room air Temp:     97.7 degrees F oral Pulse rate:   83 / minute BP sitting:   140 / 92  (left arm) Cuff size:   regular  Vitals Entered By: Zella Ball Ewing CMA Duncan Dull) (January 05, 2010 9:26 AM)  O2 Flow:  Room air CC: Bladder Infection/RE   CC:  Bladder Infection/RE.  History of Present Illness: here for acute c/o ? UTI;  has hx of recnet right ureteral stone per Dr Peterson/urology s/p ureteroscopy with stone crushing -  may stil pass some fragments per Dr Vonita Moss (per pt);   Today with dysuria for one wk;  frequency , urgency but no hematuia;  has ongong lower back pain , across the back, seemed to start after the stone retrieval;  and no LE pain/weak/numb, giat change or bowel change except for constipation  due to narcotic she took around the time of the right stone being painful in sept 2011.  Tramadol did well and di not cause constopation.  No fever, chiills, n/v, or flank pain. No No fever, wt loss, night sweats, loss of appetite or other constitutional symptoms  Due to see Dr Jarold Motto early dec.  Pt denies CP, worsening sob, doe, wheezing, orthopnea, pnd, worsening LE edema, palps, dizziness or syncope Denies worsening depressive symptoms, suicidal ideation, or panic.  Does have lower back pain recurrent without bowel change, or LE pain/weak/numb or gait change.    Problems Prior to Update: 1)  Dysuria  (ICD-788.1) 2)  Abdominal Pain, Generalized  (ICD-789.07) 3)  Nephrolithiasis  (ICD-592.0) 4)  Frequency, Urinary  (ICD-788.41) 5)  Plantar Fasciitis, Bilateral  (ICD-728.71) 6)  Neoplasm, Malignant, Colon, Family Hx, Sibling  (ICD-V16.0) 7)  Uti  (ICD-599.0) 8)  Ankle Pain, Bilateral  (ICD-719.47) 9)  Pain in Joint, Upper Arm  (ICD-719.42) 10)  Joint Effusion, Ankle  (ICD-719.07) 11)  Preventive Health  Care  (ICD-V70.0) 12)  Vaginitis  (ICD-616.10) 13)  Achilles Tendinitis  (ICD-726.71) 14)  Subacromial Bursitis, Right  (ICD-726.19) 15)  Hot Flashes  (ICD-627.2) 16)  Plantar Fasciitis, Right  (ICD-728.71) 17)  Lateral Epicondylitis, Right  (ICD-726.32) 18)  Preventive Health Care  (ICD-V70.0) 19)  Foot Pain, Bilateral  (ICD-729.5) 20)  Arm Pain, Right  (ICD-729.5) 21)  Colonic Polyps, Hx of  (ICD-V12.72) 22)  Family History Diabetes 1st Degree Relative  (ICD-V18.0) 23)  Family History Breast Cancer 1st Degree Relative <50  (ICD-V16.3) 24)  Low Back Pain  (ICD-724.2) 25)  Nephrolithiasis, Hx of  (ICD-V13.01) 26)  Hypertension  (ICD-401.9) 27)  Gerd  (ICD-530.81) 28)  Allergic Rhinitis  (ICD-477.9)  Medications Prior to Update: 1)  Lisinopril 20 Mg  Tabs (Lisinopril) .... Take 1 Tablet By Mouth Once A Day 2)  Cetirizine Hcl 10 Mg Tabs (Cetirizine Hcl) .Marland Kitchen.. 1 By Mouth Once Daily As Needed 3)  Sertraline Hcl 50 Mg Tabs (Sertraline Hcl) .Marland Kitchen.. 1 By Mouth Once Daily 4)  Tramadol Hcl 50 Mg Tabs (Tramadol Hcl) .Marland Kitchen.. 1 - 2 By Mouth Q 6 Hrs As Needed 5)  Nicotrol 10 Mg Inha (Nicotine) .... Use Asd 6)  Oxycodone Hcl 5 Mg Caps (Oxycodone Hcl) .Marland Kitchen.. 1-2 By Mouth Q 6 Hrs As Needed Pain 7)  Promethazine  Hcl 25 Mg Tabs (Promethazine Hcl) .Marland Kitchen.. 1po Q 6 Hrs As Needed Nausea 8)  Lactulose  Soln (Lactulose) .... 30 - 60 Cc By Mouth Two Times A Day As Needed Constipation 9)  Ciprofloxacin Hcl 500 Mg Tabs (Ciprofloxacin Hcl) .Marland Kitchen.. 1 By Mouth Two Times A Day  Current Medications (verified): 1)  Lisinopril 20 Mg  Tabs (Lisinopril) .... Take 1 Tablet By Mouth Once A Day 2)  Cetirizine Hcl 10 Mg Tabs (Cetirizine Hcl) .Marland Kitchen.. 1 By Mouth Once Daily As Needed 3)  Sertraline Hcl 50 Mg Tabs (Sertraline Hcl) .Marland Kitchen.. 1 By Mouth Once Daily 4)  Tramadol Hcl 50 Mg Tabs (Tramadol Hcl) .Marland Kitchen.. 1 - 2 By Mouth Q 6 Hrs As Needed 5)  Nicotrol 10 Mg Inha (Nicotine) .... Use Asd 6)  Promethazine Hcl 25 Mg Tabs (Promethazine Hcl) .Marland Kitchen..  1po Q 6 Hrs As Needed Nausea 7)  Lactulose  Soln (Lactulose) .... 30 - 60 Cc By Mouth Two Times A Day As Needed Constipation 8)  Levofloxacin 500 Mg Tabs (Levofloxacin) .Marland Kitchen.. 1 By Mouth Once Daily  Allergies (verified): 1)  ! 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   Head:  normocephalic and atraumatic.   Eyes:  vision grossly intact, pupils equal, and pupils round.   Ears:  R ear normal and L ear normal.   Nose:  no external deformity and no nasal discharge.   Mouth:  no gingival abnormalities and pharynx pink and moist.   Neck:  supple and no masses.   Lungs:  normal respiratory effort and normal breath sounds.   Heart:  normal rate and regular rhythm.   Abdomen:  soft and normal bowel sounds.  with low mid abd tender without guarding or rebound Msk:  no flank tender, no spine tender or paravertebral tender Extremities:  no edema, no erythema  Neurologic:  strength normal in all extremities, sensation intact to light touch, and gait normal.     Impression & Recommendations:  Problem # 1:  DYSURIA (ICD-788.1)  Her updated medication list for this problem includes:    Levofloxacin 500 Mg Tabs (Levofloxacin) .Marland Kitchen... 1 by mouth once daily prob UTI - for treat as above, f/u any worsening signs or symptoms , check urine cx, consider urology f/u  Orders: T-Culture, Urine ([id number])  Problem # 2:  LOW BACK PAIN  (ICD-724.2)  The following medications were removed from the medication list:    Oxycodone Hcl 5 Mg Caps (Oxycodone hcl) .Marland Kitchen... 1-2 by mouth q 6 hrs as needed pain Her updated medication list for this problem includes:    Tramadol Hcl 50 Mg Tabs (Tramadol hcl) .Marland Kitchen... 1 - 2 by mouth q 6 hrs as needed suspect underlying lumbar DDD/DJD - for tramodol refill as needed use  Problem # 3:  NEPHROLITHIASIS (ICD-592.0) doubt cause of Udip with blood, to cont to monitor for ongoing s/s  Problem # 4:  HYPERTENSION (ICD-401.9)  Her updated medication list for this problem includes:    Lisinopril  20 Mg Tabs (Lisinopril) .Marland Kitchen... Take 1 tablet by mouth once a day  BP today: 140/92 Prior BP: 130/92 (11/08/2009)  Labs Reviewed: K+: 4.1 (09/10/2008) Creat: : 0.7 (09/10/2008)   Chol: 167 (09/10/2008)   HDL: 46.90 (09/10/2008)   LDL: 93 (09/10/2008)   TG: 134.0 (09/10/2008) stable overall by hx and exam, ok to continue meds/tx as is   Complete Medication List: 1)  Lisinopril 20 Mg Tabs (Lisinopril) .... Take 1 tablet by mouth once a day 2)  Cetirizine Hcl 10 Mg Tabs (Cetirizine hcl) .Marland Kitchen.. 1 by mouth once daily as needed 3)  Sertraline Hcl 50 Mg Tabs (Sertraline hcl) .Marland Kitchen.. 1 by mouth once daily 4)  Tramadol Hcl 50 Mg Tabs (Tramadol hcl) .Marland Kitchen.. 1 - 2 by mouth q 6 hrs as needed 5)  Nicotrol 10 Mg Inha (Nicotine) .... Use asd 6)  Promethazine Hcl 25 Mg Tabs (Promethazine hcl) .Marland Kitchen.. 1po q 6 hrs as needed nausea 7)  Lactulose Soln (Lactulose) .... 30 - 60 cc by mouth two times a day as needed constipation 8)  Levofloxacin 500 Mg Tabs (Levofloxacin) .Marland Kitchen.. 1 by mouth once daily  Other Orders: UA Dipstick W/ Micro (manual) ([address])  Patient Instructions: 1)  Please take all new medications as prescribed - the antibiotic sent to your pharmacy 2)  Continue all previous medications as before this visit , including the tramadol (also sent to your pharmacy) 3)  Your urine will be sent for the urine culture 4)  Please  schedule a follow-up appointment in July 2012 with CPX labs , or sooner if needed Prescriptions: TRAMADOL HCL 50 MG TABS (TRAMADOL HCL) 1 - 2 by mouth q 6 hrs as needed  #120 x 2   Entered and Authorized by:   Corwin Levins MD   Signed by:   Corwin Levins MD on 01/05/2010   Method used:   Electronically to        [facility] (retail)       [address]       [place], [place]  [address]       Ph: [phone]       Fax: [phone]   RxID:   [id number] LEVOFLOXACIN 500 MG TABS (LEVOFLOXACIN) 1 by mouth once daily  #10 x 0   Entered and Authorized by:   Corwin Levins MD   Signed by:   Corwin Levins MD on 01/05/2010   Method used:   Electronically to        [facility] (retail)       [address]       [place], [place]  [address]       Ph: [phone]       Fax: [phone]   RxID:   [id number] LEVOFLOXACIN 500 MG TABS (LEVOFLOXACIN) 1 by mouth once daily  #10 x 0   Entered and Authorized by:   Corwin Levins MD   Signed by:   Corwin Levins MD on 01/05/2010   Method used:   Print then Give to Patient   RxID:   [id number]    Orders Added: 1)  UA Dipstick W/ Micro (manual) [[address]] 2)  T-Culture, Urine [[id number]] 3)  Est. Patient Level IV [[address]]    Laboratory Results   Urine Tests    Routine Urinalysis   Color: lt. yellow Appearance: Clear Glucose: negative   (  Normal Range: Negative) Bilirubin: negative   (Normal Range: Negative) Ketone: negative   (Normal Range: Negative) Spec. Gravity: 1.015   (Normal Range: 1.003-1.035) Blood: trace-lysed   (Normal Range: Negative) pH: 5.0   (Normal Range: 5.0-8.0) Protein: negative   (Normal Range: Negative) Urobilinogen: 0.2   (Normal Range: 0-1) Nitrite: negative   (Normal Range: Negative) Leukocyte Esterace: negative   (Normal Range: Negative)

## 2010-03-29 NOTE — Letter (Signed)
Summary: Out of Work  [facility]  [address] [place], [place] [address]   Phone: [phone]  Fax: [phone]    November 10, 2009   Employee:  [name] Walker    To Whom It May Concern:   For Medical reasons, please excuse the above named employee from work for the following dates:  Start:   11/08/2009  End:   11/15/2009  Return to work  November 15, 2009  If you need additional information, please feel free to contact our office.         Sincerely,    Dr. Oliver Barre

## 2010-03-29 NOTE — Progress Notes (Signed)
  Phone Note Call from Patient Call back at Home Phone [phone]   Caller: Patient Call For: Corwin Levins MD Summary of Call: per [facility] call states can Dr Jonny Ruiz increase her dosage of the Meloxicam 15 Mg Tabs (Meloxicam) .Marland Kitchen.. 1 by mouth once daily as needed pain...feels as if its not working , hand still hurt and her arm , feet still swelling ...[facility] [address] [place], [place]  [address] Ph: [phone] Fax: [phone] Initial call taken by: Shelbie Proctor,  August 26, 2008 2:09 PM  Follow-up for Phone Call        sorry , the highest dose of meloxicam is already the 15 mg per day; she can also take with it tylenol arthritis - 2 tabs ever 8 hrs as needed for more pain control Follow-up by: Corwin Levins MD,  August 27, 2008 12:25 PM  Additional Follow-up for Phone Call Additional follow up Details #1::        called pt  to inform pt   made aware Additional Follow-up by: Shelbie Proctor,  August 27, 2008 1:51 PM

## 2010-03-29 NOTE — Procedures (Signed)
Summary: Colonoscopy   Colonoscopy  Procedure date:  06/07/2006  Findings:      Location:  [facility].   Patient Name: [name] Walker, [name] Walker MRN:  Procedure Procedures: Colonoscopy CPT: [phone].    with biopsy. CPT: Q5068410.    with polypectomy. CPT: A3573898.  Personnel: Endoscopist: Venita Lick. Russella Dar, MD, Clementeen Graham.  Referred By: Olene Craven, MD.  Exam Location: Exam performed in Outpatient Clinic. Outpatient  Patient Consent: Procedure, Alternatives, Risks and Benefits discussed, consent obtained, from patient. Consent was obtained by the RN.  Indications Symptoms: Constipation Abdominal pain / bloating.  History  Current Medications: Patient is not currently taking Coumadin.  Pre-Exam Physical: Performed Jun 07, 2006. Cardio-pulmonary exam, Rectal exam, HEENT exam , Abdominal exam, Mental status exam WNL.  Comments: Pt. history reviewed/updated, physical exam performed prior to initiation of sedation?Yes Exam Exam: Extent of exam reached: Cecum, extent intended: Cecum.  The cecum was identified by appendiceal orifice and IC valve. Time to Cecum: 00:03: 41. Time for Withdrawl: 00:10:34. Colon retroflexion performed. ASA Classification: II. Tolerance: excellent.  Monitoring: Pulse and BP monitoring, Oximetry used. Supplemental O2 given.  Colon Prep Used MoviPrep for colon prep. Prep results: good.  Sedation Meds: Patient assessed and found to be appropriate for moderate (conscious) sedation. Fentanyl 50 mcg. given IV. Versed 8 mg. given IV.  Findings NORMAL EXAM: Splenic Flexure.  DIVERTICULOSIS: Transverse Colon. Not bleeding. ICD9: Diverticulosis: 562.10. Comments: a few scattered diverticulae.  - DIVERTICULOSIS: Sigmoid Colon. Not bleeding. ICD9: Diverticulosis: 562.10. Comments: mild.  NORMAL EXAM: Cecum to Hepatic Flexure.  MULTIPLE POLYPS: Descending Colon to Sigmoid Colon. minimum size 3 mm, maximum size 4 mm. Procedure:  snare without cautery,  removed, Polyp retrieved, 2 polyps Polyps sent to pathology. ICD9: Colon Polyps: 211.3.  POLYP: Rectum, Maximum size: 3 mm. sessile polyp. Procedure:  biopsy without cautery, removed, retrieved, Polyp sent to pathology. ICD9: Colon Polyps: 211.3.   Assessment  Diagnoses: 562.10: Diverticulosis.  211.3: Colon Polyps.   Events  Unplanned Interventions: No intervention was required.  Unplanned Events: There were no complications. Plans  Post Exam Instructions: Post sedation instructions given.  Medication Plan: Await pathology.  Patient Education: Patient given standard instructions for: Polyps. Diverticulosis. Constipation. Disposition: After procedure patient sent remain in endo suite for second procedure.  Scheduling/Referral: Colonoscopy, to [facility]. Russella Dar, MD, [facility], if polyp(s) adenomatous, around Jun 07, 2011.    cc: Coralie Carpen, MD   This report was created from the original endoscopy report, which was reviewed and signed by the above listed endoscopist.

## 2010-03-29 NOTE — Letter (Signed)
Summary: Out of Work  [facility]  [address] [place], [place] [address]   Phone: [phone]  Fax: [phone]    November 08, 2009   Employee:  Anyla S Plant    To Whom It May Concern:   For Medical reasons, please excuse the above named employee from work for the following dates:  Start:   11/08/2009  End:   11/11/2009  Return to work Friday November 12, 2009  If you need additional information, please feel free to contact our office.         Sincerely,    Dr. Oliver Barre

## 2010-03-29 NOTE — Assessment & Plan Note (Signed)
Summary: right arm pain/$50/cd   Vital Signs:  Patient profile:   50 year old female Height:      62 inches Weight:      122.25 pounds BMI:     22.44 O2 Sat:      99 % Temp:     98.6 degrees F oral Pulse rate:   84 / minute BP sitting:   114 / 62  (right arm) Cuff size:   regular  Vitals Entered By: Windell Norfolk (August 18, 2008 4:18 PM) CC: right arm pain and both ankles swollen x 2 weeks   CC:  right arm pain and both ankles swollen x 2 weeks.  History of Present Illness: here with acute onset per pt of pain to the right elbow area, mild to mod, no visible swelling, erythema , fever or trauma; denies overuse, but has tenderness near the elbow that is worse with supination/pronation, worse to pick up objects more than a few lbs, and not assoc with neck pain or any distal RUE symptoms, such as weakness, numbness, pain or loss of grip; she is right handed; also c/o slightly longer period of time of more moderate 5/10 severity pain to the right plantar heel - again denies sweling, erythema, ulcers, fever or trauma, but limps somewhat to walk now for the past wk; denies excessive standing or jumping but do "a lot walking", no LBP or problems with with LE weakness, numbness or pain  Problems Prior to Update: 1)  Preventive Health Care  (ICD-V70.0) 2)  Foot Pain, Bilateral  (ICD-729.5) 3)  Arm Pain, Right  (ICD-729.5) 4)  Colonic Polyps, Hx of  (ICD-V12.72) 5)  Family History Diabetes 1st Degree Relative  (ICD-V18.0) 6)  Family History Breast Cancer 1st Degree Relative <50  (ICD-V16.3) 7)  Low Back Pain  (ICD-724.2) 8)  Nephrolithiasis, Hx of  (ICD-V13.01) 9)  Hypertension  (ICD-401.9) 10)  Gerd  (ICD-530.81) 11)  Allergic Rhinitis  (ICD-477.9)  Medications Prior to Update: 1)  Lisinopril 20 Mg  Tabs (Lisinopril) .... Take 1 Tablet By Mouth Once A Day 2)  Tramadol Hcl 50 Mg  Tabs (Tramadol Hcl) .... Take 1 Tablet By Mouth Once A Day  Current Medications (verified): 1)  Lisinopril  20 Mg  Tabs (Lisinopril) .... Take 1 Tablet By Mouth Once A Day 2)  Meloxicam 15 Mg Tabs (Meloxicam) .Marland Kitchen.. 1 By Mouth Once Daily As Needed Pain 3)  Cetirizine Hcl 10 Mg Tabs (Cetirizine Hcl) .Marland Kitchen.. 1 By Mouth Once Daily As Needed 4)  Sertraline Hcl 50 Mg Tabs (Sertraline Hcl) .Marland Kitchen.. 1 By Mouth Once Daily  Allergies (verified): 1)  ! 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   Head:  normocephalic and atraumatic.   Eyes:  vision grossly intact, pupils equal, and pupils round.   Ears:  right tm mild erythema  only, left OK, canals unremarkable  Nose:  no external deformity and no nasal discharge.   Mouth:  no gingival abnormalities and pharynx pink and moist.   Neck:  supple and no masses.   Lungs:  normal respiratory effort and normal breath sounds.   Heart:  normal rate and regular rhythm.   Msk:  mild tender to right lat epicondylar area without sweling or erythema; also tender right plantar heel without erythema, sweling or ulcer Extremities:  no edema, no ulcers  Neurologic:  strength normal in all extremities and sensation intact to light touch.     Impression & Recommendations:  Problem # 1:  LATERAL EPICONDYLITIS, RIGHT (ICD-726.32) tx with nsaid as needed, to return or f/u if worsens,  declines films or refer to ortho today, also encouraged to use right forearm band  Problem # 2:  PLANTAR FASCIITIS, RIGHT (ICD-728.71)  Her updated medication list for this problem includes:    Meloxicam 15 Mg Tabs (Meloxicam) .Marland Kitchen... 1 by mouth once daily as needed pain treat as above, f/u any worsening signs or symptoms, to avoid walking uphill, or prolonged standing; for heel cushions or shoe inserts; declines podiatry as needed   Problem # 3:  HOT FLASHES (ICD-627.2) suspect related to menopause - to try trial of zoloft once daily   Problem # 4:  ALLERGIC RHINITIS (ICD-477.9)  Her updated medication list for this problem includes:    Cetirizine Hcl 10 Mg Tabs (Cetirizine hcl) .Marland Kitchen... 1 by mouth once daily as needed treat as above, f/u any worsening signs or symptoms   Complete Medication List: 1)  Lisinopril 20 Mg Tabs (Lisinopril) .... Take 1 tablet by mouth once a day 2)  Meloxicam 15 Mg Tabs (Meloxicam) .Marland Kitchen.. 1 by mouth once daily as needed pain 3)  Cetirizine Hcl 10 Mg Tabs (Cetirizine hcl) .Marland Kitchen.. 1 by mouth once daily as needed 4)  Sertraline Hcl 50 Mg Tabs (Sertraline hcl) .Marland Kitchen.. 1 by mouth once daily  Patient Instructions: 1)  Please take all new medications as prescribed 2)  start the sertraline (generic for zoloft) at HALF pill for 5 days, then whole pill per day after that 3)  Continue all medications that you may have been taking previously  4)  Please schedule a follow-up appointment in 3 months with CPX labs 5)  please call if you would like to be referred to orthopedic or podiatry Prescriptions: SERTRALINE HCL 50 MG TABS (SERTRALINE HCL) 1 by mouth once daily  #30 x 11   Entered and Authorized by:   Corwin Levins MD   Signed by:   Corwin Levins MD on 08/18/2008   Method used:   Print then Give to Patient   RxID:   [id number] CETIRIZINE HCL 10 MG TABS (CETIRIZINE HCL) 1 by mouth once daily as needed  #30 x 11   Entered and Authorized by:   Corwin Levins MD   Signed by:    Corwin Levins MD on 08/18/2008   Method used:   Print then Give to Patient   RxID:   [id number] MELOXICAM 15 MG TABS (MELOXICAM) 1 by mouth once daily as needed pain  #30 x 11   Entered and Authorized by:   Corwin Levins MD   Signed by:   Corwin Levins MD on 08/18/2008   Method used:   Print then Give to Patient   RxID:   [id number]

## 2010-03-29 NOTE — Progress Notes (Signed)
Summary: RX request  Phone Note Call from Patient Call back at Home Phone [phone]   Caller: Patient Call For: Corwin Levins MD Summary of Call: Patient came into the office requesting a prescription for fluid pills. Initial call taken by: Irma Newness,  October 15, 2008 1:37 PM  Follow-up for Phone Call        based on the last exam she had swelling of the ankle only - this is not treated with fluid pills;  I would not tx with that at this time Follow-up by: Corwin Levins MD,  October 15, 2008 2:54 PM  Additional Follow-up for Phone Call Additional follow up Details #1::        Notified pt gave md advise. pt made appt to see md 10/19/08 to f/u edema in ankle Additional Follow-up by: Orlan Leavens,  October 15, 2008 3:32 PM

## 2010-03-29 NOTE — Assessment & Plan Note (Signed)
Summary: f/u ortho problems, and wellness   Vital Signs:  Patient profile:   50 year old female Height:      63 inches Weight:      124.38 pounds BMI:     22.11 O2 Sat:      98 % Temp:     97.6 degrees F oral Pulse rate:   75 / minute BP sitting:   126 / 82  (right arm) Cuff size:   regular  Vitals Entered By: Windell Norfolk (September 10, 2008 10:13 AM) CC: ankle pain and right elbow pain/wellness   CC:  ankle pain and right elbow pain/wellness.  History of Present Illness: here for above, hoping for blood tests this visit to r/o lupus et al as she had a friend with lupus; has been unable to schedule with the orthopedic as she owed $20 and has not been able to pay that so far; today the shoulder pain is improved, but still the right elbow pain and tenderness persists though better, the plantar fasciits and achilles tendonitis on the right persists witout significant change, and new this week she has significant bilat ankle joint pain and swelling right > left , worse to stand and walk for even 15 minutes; again no fever, trauma, or hx of gout  Problems Prior to Update: 1)  Pain in Joint, Upper Arm  (ICD-719.42) 2)  Joint Effusion, Ankle  (ICD-719.07) 3)  Preventive Health Care  (ICD-V70.0) 4)  Vaginitis  (ICD-616.10) 5)  Achilles Tendinitis  (ICD-726.71) 6)  Subacromial Bursitis, Right  (ICD-726.19) 7)  Hot Flashes  (ICD-627.2) 8)  Plantar Fasciitis, Right  (ICD-728.71) 9)  Lateral Epicondylitis, Right  (ICD-726.32) 10)  Preventive Health Care  (ICD-V70.0) 11)  Foot Pain, Bilateral  (ICD-729.5) 12)  Arm Pain, Right  (ICD-729.5) 13)  Colonic Polyps, Hx of  (ICD-V12.72) 14)  Family History Diabetes 1st Degree Relative  (ICD-V18.0) 15)  Family History Breast Cancer 1st Degree Relative <50  (ICD-V16.3) 16)  Low Back Pain  (ICD-724.2) 17)  Nephrolithiasis, Hx of  (ICD-V13.01) 18)  Hypertension  (ICD-401.9) 19)  Gerd  (ICD-530.81) 20)  Allergic Rhinitis  (ICD-477.9)  Medications  Prior to Update: 1)  Lisinopril 20 Mg  Tabs (Lisinopril) .... Take 1 Tablet By Mouth Once A Day 2)  Cetirizine Hcl 10 Mg Tabs (Cetirizine Hcl) .Marland Kitchen.. 1 By Mouth Once Daily As Needed 3)  Sertraline Hcl 50 Mg Tabs (Sertraline Hcl) .Marland Kitchen.. 1 By Mouth Once Daily 4)  Prednisone 10 Mg Tabs (Prednisone) .... 3po Qd For 3days, Then 2po Qd For 3days, Then 1po Qd For 3days, Then Stop 5)  Tramadol Hcl 50 Mg Tabs (Tramadol Hcl) .Marland Kitchen.. 1 - 2 By Mouth Four Times Per Day As Needed  Current Medications (verified): 1)  Lisinopril 20 Mg  Tabs (Lisinopril) .... Take 1 Tablet By Mouth Once A Day 2)  Cetirizine Hcl 10 Mg Tabs (Cetirizine Hcl) .Marland Kitchen.. 1 By Mouth Once Daily As Needed 3)  Sertraline Hcl 50 Mg Tabs (Sertraline Hcl) .Marland Kitchen.. 1 By Mouth Once Daily 4)  Prednisone 10 Mg Tabs (Prednisone) .... 3po Qd For 3days, Then 2po Qd For 3days, Then 1po Qd For 3days, Then Stop 5)  Tramadol Hcl 50 Mg Tabs (Tramadol Hcl) .Marland Kitchen.. 1 - 2 By Mouth Four Times Per Day As Needed  Allergies (verified): 1)  ! 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         all otherwise negative   Physical Exam  General:  alert and well-developed.   Head:  normocephalic and atraumatic.   Eyes:  vision grossly intact, pupils equal, and pupils round.   Ears:  R ear normal and L ear normal.   Nose:  no external deformity and no nasal discharge.   Mouth:  no gingival abnormalities and pharynx pink and moist.   Neck:  supple and no masses.   Lungs:  normal respiratory effort and normal breath sounds.   Heart:  normal rate, regular rhythm, and no murmur.   Abdomen:  soft, non-tender, and normal bowel sounds.   Msk:  no joint tenderness and no joint swelling.  except for bilat ankle joint right > left trace to 1+ but with FROM and minor tender at best; o/w ortho exam as per last OV - no signifcant change Extremities:  no edema, no ulcers  Neurologic:  cranial nerves II-XII intact and strength normal in all extremities.     Impression & Recommendations:  Problem # 1:  Preventive Health Care (ICD-V70.0)  Overall doing well, up to date, counseled on routine health concerns for screening and prevention, immunizations up to date or declined, labs ordered  Orders: T-Vitamin D (25-Hydroxy) (54098-11914) TLB-BMP (Basic Metabolic Panel-BMET) (80048-METABOL) TLB-CBC Platelet - w/Differential (85025-CBCD) TLB-Hepatic/Liver Function Pnl (80076-HEPATIC) TLB-Lipid Panel (80061-LIPID) TLB-TSH (Thyroid Stimulating Hormone) (84443-TSH) TLB-Udip ONLY (81003-UDIP)  Problem # 2:  ACHILLES TENDINITIS (ICD-726.71) treat as above, f/u any worsening signs or symptoms  - for ultram, ortho eval as before  Problem # 3:  PLANTAR FASCIITIS, RIGHT (ICD-728.71) treat as above, f/u any worsening signs or symptoms - for ortho eval, ultram as neded as beefore  Problem # 4:  JOINT EFFUSION, ANKLE  (ICD-719.07)  bilat , right more than left - to also check uric acid, sed reate, RF, ana - suspect DJD however  Orders: T-ANA (78295-62130) TLB-Sedimentation Rate (ESR) (85652-ESR) TLB-Rheumatoid Factor (RA) (86578-IO)  Problem # 5:  PAIN IN JOINT, UPPER ARM (ICD-719.42) elbow and shoulder as before; ultram as needed and f/u ortho as before  Complete Medication List: 1)  Lisinopril 20 Mg Tabs (Lisinopril) .... Take 1 tablet by mouth once a day 2)  Cetirizine Hcl 10 Mg Tabs (Cetirizine hcl) .Marland Kitchen.. 1 by mouth once daily as needed 3)  Sertraline Hcl 50 Mg Tabs (Sertraline hcl) .Marland Kitchen.. 1 by mouth once daily 4)  Prednisone 10 Mg Tabs (Prednisone) .... 3po qd for 3days, then 2po qd for 3days, then 1po qd for 3days, then stop 5)  Tramadol Hcl 50 Mg Tabs (Tramadol hcl) .Marland Kitchen.. 1 - 2 by mouth four times per day as needed  Patient Instructions: 1)  stop the prednisone 2)  continue the tramadol 3)  please call the doctor who ordered to nerve test on the right arm to find out the results, and have them fax it here as well 4)  please see orthopedic as we discussed 5)  Please go to the Lab in the basement for your blood tests today 6)  Please schedule a follow-up appointment in 1 year or sooner if needed

## 2010-03-29 NOTE — Assessment & Plan Note (Signed)
Summary: f/u on swelling ankle/lmb   Vital Signs:  Patient profile:   50 year old female Height:      62 inches Weight:      123 pounds BMI:     22.58 O2 Sat:      96 % Temp:     99.0 degrees F oral Pulse rate:   86 / minute BP sitting:   132 / 86  (left arm) Cuff size:   regular  Vitals Entered By: Windell Norfolk (October 19, 2008 1:18 PM)  Preventive Care Screening  Colonoscopy:    Next Due:  05/2011  CC: F/U ON SWOLLEN ANKLE   CC:  F/U ON SWOLLEN ANKLE.  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  also menitons tramadol makes her sleepy  Problems Prior to Update: 1)  Neoplasm, Malignant, Colon, Family Hx, Sibling  (ICD-V16.0) 2)  Uti  (ICD-599.0) 3)  Ankle Pain, Bilateral  (ICD-719.47) 4)  Pain in Joint, Upper Arm  (ICD-719.42) 5)  Joint Effusion, Ankle  (ICD-719.07) 6)  Preventive Health Care  (ICD-V70.0) 7)  Vaginitis  (ICD-616.10) 8)  Achilles Tendinitis  (ICD-726.71) 9)  Subacromial Bursitis, Right  (ICD-726.19) 10)  Hot Flashes  (ICD-627.2) 11)  Plantar Fasciitis, Right  (ICD-728.71) 12)  Lateral Epicondylitis, Right  (ICD-726.32) 13)  Preventive Health Care  (ICD-V70.0) 14)  Foot Pain, Bilateral   (ICD-729.5) 15)  Arm Pain, Right  (ICD-729.5) 16)  Colonic Polyps, Hx of  (ICD-V12.72) 17)  Family History Diabetes 1st Degree Relative  (ICD-V18.0) 18)  Family History Breast Cancer 1st Degree Relative <50  (ICD-V16.3) 19)  Low Back Pain  (ICD-724.2) 20)  Nephrolithiasis, Hx of  (ICD-V13.01) 21)  Hypertension  (ICD-401.9) 22)  Gerd  (ICD-530.81) 23)  Allergic Rhinitis  (ICD-477.9)  Medications Prior to Update: 1)  Lisinopril 20 Mg  Tabs (Lisinopril) .... Take 1 Tablet By Mouth Once A Day 2)  Cetirizine Hcl 10 Mg Tabs (Cetirizine Hcl) .Marland Kitchen.. 1 By Mouth Once Daily As Needed 3)  Sertraline Hcl 50 Mg Tabs (Sertraline Hcl) .Marland Kitchen.. 1 By Mouth Once Daily 4)  Prednisone 10 Mg Tabs (Prednisone) .... 3po Qd For 3days, Then 2po Qd For 3days, Then 1po Qd For 3days, Then Stop 5)  Tramadol Hcl 50 Mg Tabs (Tramadol Hcl) .Marland Kitchen.. 1 - 2 By Mouth Four Times Per Day As Needed  Current Medications (verified): 1)  Lisinopril 20 Mg  Tabs (Lisinopril) .... Take 1 Tablet By Mouth Once A Day 2)  Cetirizine Hcl 10 Mg Tabs (Cetirizine Hcl) .Marland Kitchen.. 1 By Mouth Once Daily As Needed 3)  Sertraline Hcl 50 Mg Tabs (Sertraline Hcl) .Marland Kitchen.. 1 By Mouth Once Daily 4)  Hydrocodone-Acetaminophen 5-325 Mg Tabs (Hydrocodone-Acetaminophen) .Marland Kitchen.. 1 By Mouth Q 6 Hrs As Needed Pain 5)  Ciprofloxacin Hcl 500 Mg Tabs (Ciprofloxacin Hcl) .Marland Kitchen.. 1po Two Times A Day  Allergies: 1)  ! 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 Family History of Arthritis Family History  Breast cancer  Family History Diabetes  Family History Hypertension Family History of Prostate CA  Family History of Stroke  sister colon cancer  Review of Systems       all otherwise negative - 12 system review done   Physical Exam  General:  alert and well-developed.   Head:  normocephalic and atraumatic.   Eyes:  vision grossly intact, pupils equal, and pupils round.   Ears:  R ear normal and L ear normal.   Nose:  no external deformity and no nasal discharge.   Mouth:  no gingival abnormalities and pharynx pink and moist.   Neck:  supple and no masses.   Lungs:  normal respiratory effort and normal breath sounds.   Heart:  normal rate and regular rhythm.   Abdomen:  soft, non-tender, and normal bowel sounds. except for mild suprapubic tender Msk:  mild right flank tender, also marked tender noted at the ankle anteriorly along the dorsal tendons assoc with mild worsening enthosopicy type pain and swelling to the feet and ankles ; does not seem to have enkle effusions today Extremities:  no edema, no erythema except for the above Neurologic:  cranial nerves II-XII intact and strength normal in lower extremities.     Impression & Recommendations:  Problem # 1:  ANKLE PAIN, BILATERAL (ICD-719.47)  I think most c/w tenosynovitis right > left, worse today, just as she needs to start back to work wed aug 25; will tx with hydrocodone but advised her this is short term only; to check films to r/o significant  DJD; will need to see ortho or podiatry if pain persists or worsens which she has not done as above so far due to cost  Orders: T-Ankle Comp Left Min 3 Views (73610TC) T-Ankle Comp Right (73610TC)  Problem # 2:  UTI (ICD-599.0)  most likely, cant r/o right pyelo as well with her exam - check urine studies, tx with cipro   Her updated medication list for this problem includes:    Ciprofloxacin Hcl 500 Mg Tabs (Ciprofloxacin hcl) .Marland Kitchen... 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 Take 1 tablet by mouth once a day 2)  Cetirizine Hcl 10 Mg Tabs (Cetirizine hcl) .Marland Kitchen.. 1 by mouth once daily as needed 3)  Sertraline Hcl 50 Mg Tabs (Sertraline hcl) .Marland Kitchen.. 1 by mouth once daily 4)  Hydrocodone-acetaminophen 5-325 Mg Tabs (Hydrocodone-acetaminophen) .Marland Kitchen.. 1 by mouth q 6 hrs as needed pain 5)  Ciprofloxacin Hcl 500 Mg Tabs (Ciprofloxacin hcl) .Marland KitchenMarland KitchenMarland Kitchen  1po two times a day  Patient Instructions: 1)  Please take all new medications as prescribed 2)  Continue all previous medications as before this visit  3)  Please go to the Lab in the basement for your blood and/or urine tests today  4)  Please schedule a follow-up appointment as needed. Prescriptions: CIPROFLOXACIN HCL 500 MG TABS (CIPROFLOXACIN HCL) 1po two times a day  #20 x 0   Entered and Authorized by:   Corwin Levins MD   Signed by:   Corwin Levins MD on 10/19/2008   Method used:   Print then Give to Patient   RxID:   [id number] HYDROCODONE-ACETAMINOPHEN 5-325 MG TABS (HYDROCODONE-ACETAMINOPHEN) 1 by mouth q 6 hrs as needed pain  #50 x 1   Entered and Authorized by:   Corwin Levins MD   Signed by:   Corwin Levins MD on 10/19/2008   Method used:   Print then Give to Patient   RxID:   [id number]

## 2010-03-29 NOTE — Letter (Signed)
Summary: [facility]  [facility]   Imported By: Lennie Odor 11/22/2009 11:11:59  _____________________________________________________________________  External Attachment:    Type:   Image     Comment:   External Document

## 2010-03-29 NOTE — Assessment & Plan Note (Signed)
Summary: CAN'T WALK/ FOOT PAIN/NWS  ER FU/ #   Vital Signs:  Patient profile:   50 year old female Height:      63 inches Weight:      127 pounds BMI:     22.58 O2 Sat:      98 % Temp:     98.3 degrees F oral Pulse rate:   85 / minute BP sitting:   128 / 96  (right arm) Cuff size:   regular  Vitals Entered By: Windell Norfolk (November 11, 2008 1:48 PM) CC: BOTH FEET PAIN X 1-2 MONTHS   CC:  BOTH FEET PAIN X 1-2 MONTHS.  History of Present Illness: here unfortuately with persistent bilat heel pain , despite tx here last visit per emr, and being seen in the ER and given cortisone shot thee per pt and prednisoe burst and taper off;  she has also been complaint with meds, and did try the shoe inserts for the tennis shoe type shoe for work, but still with same pain to the heels as she stands at work all day;  cant hydrocodone at work as it makes her sleepy;  finally could not go to work today;  it is noted though that the tendonitis type pain to the anterior ankle and dorsal foot has some improved in that it is no longer tender to palpate, but still painful to walk;  no other new complaints.  Pt denies CP, sob, doe, wheezing, orthopnea, pnd, worsening LE edema, palps, dizziness or syncope   Pt denies new neuro symptoms such as headache, facial or extremity weakness   Problems Prior to Update: 1)  Plantar Fasciitis, Bilateral  (ICD-728.71) 2)  Neoplasm, Malignant, Colon, Family Hx, Sibling  (ICD-V16.0) 3)  Uti  (ICD-599.0) 4)  Ankle Pain, Bilateral  (ICD-719.47) 5)  Pain in Joint, Upper Arm  (ICD-719.42) 6)  Joint Effusion, Ankle  (ICD-719.07) 7)  Preventive Health Care  (ICD-V70.0) 8)  Vaginitis  (ICD-616.10) 9)  Achilles Tendinitis  (ICD-726.71) 10)  Subacromial Bursitis, Right  (ICD-726.19) 11)  Hot Flashes  (ICD-627.2) 12)  Plantar Fasciitis, Right  (ICD-728.71) 13)  Lateral Epicondylitis, Right  (ICD-726.32) 14)  Preventive Health Care  (ICD-V70.0) 15)  Foot Pain, Bilateral   (ICD-729.5) 16)  Arm Pain, Right  (ICD-729.5) 17)  Colonic Polyps, Hx of  (ICD-V12.72) 18)  Family History Diabetes 1st Degree Relative  (ICD-V18.0) 19)  Family History Breast Cancer 1st Degree Relative <50  (ICD-V16.3) 20)  Low Back Pain  (ICD-724.2) 21)  Nephrolithiasis, Hx of  (ICD-V13.01) 22)  Hypertension  (ICD-401.9) 23)  Gerd  (ICD-530.81) 24)  Allergic Rhinitis  (ICD-477.9)  Medications Prior to Update: 1)  Lisinopril 20 Mg  Tabs (Lisinopril) .... Take 1 Tablet By Mouth Once A Day 2)  Cetirizine Hcl 10 Mg Tabs (Cetirizine Hcl) .Marland Kitchen.. 1 By Mouth Once Daily As Needed 3)  Sertraline Hcl 50 Mg Tabs (Sertraline Hcl) .Marland Kitchen.. 1 By Mouth Once Daily 4)  Hydrocodone-Acetaminophen 5-325 Mg Tabs (Hydrocodone-Acetaminophen) .Marland Kitchen.. 1 By Mouth Q 6 Hrs As Needed Pain 5)  Ciprofloxacin Hcl 500 Mg Tabs (Ciprofloxacin Hcl) .Marland Kitchen.. 1po Two Times A Day  Current Medications (verified): 1)  Lisinopril 20 Mg  Tabs (Lisinopril) .... Take 1 Tablet By Mouth Once A Day 2)  Cetirizine Hcl 10 Mg Tabs (Cetirizine Hcl) .Marland Kitchen.. 1 By Mouth Once Daily As Needed 3)  Sertraline Hcl 50 Mg Tabs (Sertraline Hcl) .Marland Kitchen.. 1 By Mouth Once Daily 4)  Tramadol Hcl 50 Mg Tabs (Tramadol  Hcl) .... 1 - 2 By Mouth Q 6 Hrs As Needed 5)  Nicotrol 10 Mg Inha (Nicotine) .... Use Asd  Allergies (verified): 1)  ! 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   Head:  normocephalic and atraumatic.   Eyes:  vision grossly intact, pupils equal, and pupils round.   Ears:  R ear normal and L ear normal.   Nose:  no external deformity and no nasal discharge.   Mouth:  no gingival abnormalities and pharynx pink and moist.   Neck:  supple and no masses.   Lungs:  normal respiratory effort and normal breath sounds.   Heart:  normal rate and regular rhythm.   Msk:  bialt heels marked tender without erythema, swelling or ulceration Extremities:  no edema, no erythema  Neurologic:  cranial nerves II-XII intact and strength normal in all extremities.     Impression & Recommendations:  Problem # 1:  PLANTAR FASCIITIS, BILATERAL (ICD-728.71)  persistent despite recent tx with heel injection and prednisone; change hydrocodone to tramadol as it makes her sleepy, refer podiatry  - may even need casting, gave work note as well  Orders: Podiatry Referral (Podiatry)  Problem # 2:  ALLERGIC RHINITIS (ICD-477.9)  Her updated medication list for this problem includes:    Cetirizine Hcl 10 Mg Tabs (Cetirizine hcl) .Marland Kitchen... 1 by mouth once daily as needed also to try the mucinex OTC  Problem # 3:  ANKLE PAIN, BILATERAL (ICD-719.47) tendonitis to ankle and dorsal foot improved - pt reassured, ok to follow  Problem # 4:  HYPERTENSION (ICD-401.9)  Her updated medication list for this problem includes:    Lisinopril 20 Mg Tabs (Lisinopril) .Marland Kitchen... Take 1 tablet by mouth once a day stable overall by hx and exam, ok to continue meds/tx as is   Complete Medication List: 1)  Lisinopril 20 Mg Tabs (Lisinopril) .... Take 1 tablet by mouth once a day 2)  Cetirizine Hcl 10 Mg Tabs (Cetirizine hcl) .Marland Kitchen.. 1 by mouth once daily as needed 3)  Sertraline Hcl 50 Mg Tabs (Sertraline hcl) .Marland Kitchen.. 1 by mouth once daily 4)  Tramadol Hcl 50 Mg Tabs (Tramadol hcl) .Marland Kitchen.. 1 - 2 by mouth q 6 hrs as needed 5)  Nicotrol 10 Mg Inha (Nicotine) .... Use asd  Patient  Instructions: 1)  stop the hydrocodone  2)  take the tramadol you have at home 3)  re-start the cetirizine (generic for zyrtec) for the allergies 4)  You can also use Mucinex OTC or it's generic for congestion  5)  you are given the prescription for quitting smoking 6)  you are given the note for off work 7)  Please schedule a follow-up appointment as needed. Prescriptions: NICOTROL 10 MG INHA (NICOTINE) use asd  #1 box x 2   Entered and Authorized by:   Corwin Levins MD   Signed by:   Corwin Levins MD on 11/11/2008  Method used:   Print then Give to Patient   RxID:   [id number] TRAMADOL HCL 50 MG TABS (TRAMADOL HCL) 1 - 2 by mouth q 6 hrs as needed  #120 x 1   Entered and Authorized by:   Corwin Levins MD   Signed by:   Corwin Levins MD on 11/11/2008   Method used:   Print then Give to Patient   RxID:   [id number] CETIRIZINE HCL 10 MG TABS (CETIRIZINE HCL) 1 by mouth once daily as needed  #30 x 11   Entered and Authorized by:   Corwin Levins MD   Signed by:   Corwin Levins MD on 11/11/2008   Method used:   Print then Give to Patient   RxID:   [id number]

## 2010-05-11 LAB — DIFFERENTIAL
Basophils Absolute: 0.1 10*3/uL (ref 0.0–0.1)
Basophils Relative: 2 % — ABNORMAL HIGH (ref 0–1)
Eosinophils Absolute: 0.1 10*3/uL (ref 0.0–0.7)
Eosinophils Relative: 2 % (ref 0–5)
Lymphocytes Relative: 44 % (ref 12–46)
Lymphs Abs: 2.5 10*3/uL (ref 0.7–4.0)
Monocytes Absolute: 0.5 10*3/uL (ref 0.1–1.0)
Monocytes Relative: 9 % (ref 3–12)
Neutro Abs: 2.4 10*3/uL (ref 1.7–7.7)
Neutrophils Relative %: 43 % (ref 43–77)

## 2010-05-11 LAB — CBC
HCT: 37.3 % (ref 36.0–46.0)
Hemoglobin: 12.4 g/dL (ref 12.0–15.0)
MCH: 29.4 pg (ref 26.0–34.0)
MCHC: 33.3 g/dL (ref 30.0–36.0)
MCV: 88.3 fL (ref 78.0–100.0)
Platelets: 143 10*3/uL — ABNORMAL LOW (ref 150–400)
RBC: 4.22 MIL/uL (ref 3.87–5.11)
RDW: 14.8 % (ref 11.5–15.5)
WBC: 5.6 10*3/uL (ref 4.0–10.5)

## 2010-05-11 LAB — POCT I-STAT, CHEM 8
BUN: 13 mg/dL (ref 6–23)
Calcium, Ion: 1.22 mmol/L (ref 1.12–1.32)
Chloride: 112 mEq/L (ref 96–112)
Creatinine, Ser: 0.5 mg/dL (ref 0.4–1.2)
Glucose, Bld: 113 mg/dL — ABNORMAL HIGH (ref 70–99)
HCT: 41 % (ref 36.0–46.0)
Hemoglobin: 13.9 g/dL (ref 12.0–15.0)
Potassium: 3.9 mEq/L (ref 3.5–5.1)
Sodium: 145 mEq/L (ref 135–145)
TCO2: 24 mmol/L (ref 0–100)

## 2010-05-11 LAB — URINALYSIS, ROUTINE W REFLEX MICROSCOPIC
Bilirubin Urine: NEGATIVE
Glucose, UA: NEGATIVE mg/dL
Ketones, ur: NEGATIVE mg/dL
Leukocytes, UA: NEGATIVE
Nitrite: NEGATIVE
Protein, ur: NEGATIVE mg/dL
Specific Gravity, Urine: 1.015 (ref 1.005–1.030)
Urobilinogen, UA: 0.2 mg/dL (ref 0.0–1.0)
pH: 6 (ref 5.0–8.0)

## 2010-05-11 LAB — URINE CULTURE
Colony Count: NO GROWTH
Culture  Setup Time: 201110012019
Culture: NO GROWTH

## 2010-05-11 LAB — PROTIME-INR
INR: 0.98 (ref 0.00–1.49)
Prothrombin Time: 13.2 seconds (ref 11.6–15.2)

## 2010-05-11 LAB — URINE MICROSCOPIC-ADD ON

## 2010-05-12 LAB — BASIC METABOLIC PANEL
BUN: 10 mg/dL (ref 6–23)
CO2: 22 mEq/L (ref 19–32)
Calcium: 9.6 mg/dL (ref 8.4–10.5)
Chloride: 107 mEq/L (ref 96–112)
Creatinine, Ser: 0.74 mg/dL (ref 0.4–1.2)
GFR calc Af Amer: >60 mL/min (ref >60)
GFR calc non Af Amer: >60 mL/min (ref >60)
Glucose, Bld: 111 mg/dL — ABNORMAL HIGH (ref 70–99)
Potassium: 3.4 mEq/L — ABNORMAL LOW (ref 3.5–5.1)
Sodium: 139 mEq/L (ref 135–145)

## 2010-05-12 LAB — POCT URINALYSIS DIPSTICK
Glucose, UA: NEGATIVE mg/dL
Nitrite: POSITIVE — AB
Protein, ur: 100 mg/dL — AB
Specific Gravity, Urine: 1.025 (ref 1.005–1.030)
Urobilinogen, UA: 1 mg/dL (ref 0.0–1.0)
pH: 5.5 (ref 5.0–8.0)

## 2010-05-12 LAB — POCT I-STAT, CHEM 8
BUN: 14 mg/dL (ref 6–23)
Calcium, Ion: 1.25 mmol/L (ref 1.12–1.32)
Chloride: 111 mEq/L (ref 96–112)
Creatinine, Ser: 0.6 mg/dL (ref 0.4–1.2)
Glucose, Bld: 89 mg/dL (ref 70–99)
HCT: 41 % (ref 36.0–46.0)
Hemoglobin: 13.9 g/dL (ref 12.0–15.0)
Potassium: 3.5 mEq/L (ref 3.5–5.1)
Sodium: 144 mEq/L (ref 135–145)
TCO2: 26 mmol/L (ref 0–100)

## 2010-05-12 LAB — CBC
HCT: 42.5 % (ref 36.0–46.0)
Hemoglobin: 14.3 g/dL (ref 12.0–15.0)
MCH: 29.8 pg (ref 26.0–34.0)
MCHC: 33.7 g/dL (ref 30.0–36.0)
MCV: 88.2 fL (ref 78.0–100.0)
Platelets: 116 10*3/uL — ABNORMAL LOW (ref 150–400)
RBC: 4.81 MIL/uL (ref 3.87–5.11)
RDW: 14.7 % (ref 11.5–15.5)
WBC: 9.1 10*3/uL (ref 4.0–10.5)

## 2010-05-12 LAB — URINALYSIS, ROUTINE W REFLEX MICROSCOPIC
Bilirubin Urine: NEGATIVE
Glucose, UA: NEGATIVE mg/dL
Glucose, UA: NEGATIVE mg/dL
Ketones, ur: 15 mg/dL — AB
Ketones, ur: NEGATIVE mg/dL
Nitrite: POSITIVE — AB
Nitrite: NEGATIVE
Protein, ur: >300 mg/dL — AB
Protein, ur: NEGATIVE mg/dL
Specific Gravity, Urine: 1.023 (ref 1.005–1.030)
Specific Gravity, Urine: 1.006 (ref 1.005–1.030)
Urobilinogen, UA: 1 mg/dL (ref 0.0–1.0)
Urobilinogen, UA: 0.2 mg/dL (ref 0.0–1.0)
pH: 6 (ref 5.0–8.0)
pH: 6.5 (ref 5.0–8.0)

## 2010-05-12 LAB — DIFFERENTIAL
Basophils Absolute: 0 10*3/uL (ref 0.0–0.1)
Basophils Relative: 0 % (ref 0–1)
Eosinophils Absolute: 0 10*3/uL (ref 0.0–0.7)
Eosinophils Relative: 0 % (ref 0–5)
Lymphocytes Relative: 11 % — ABNORMAL LOW (ref 12–46)
Lymphs Abs: 1 10*3/uL (ref 0.7–4.0)
Monocytes Absolute: 0.4 10*3/uL (ref 0.1–1.0)
Monocytes Relative: 4 % (ref 3–12)
Neutro Abs: 7.7 10*3/uL (ref 1.7–7.7)
Neutrophils Relative %: 85 % — ABNORMAL HIGH (ref 43–77)

## 2010-05-12 LAB — URINE CULTURE
Colony Count: >=100000
Colony Count: NO GROWTH
Culture  Setup Time: 201109260016
Culture  Setup Time: 201109051057
Culture  Setup Time: 201109050000
Culture: NO GROWTH

## 2010-05-12 LAB — POCT I-STAT 4, (NA,K, GLUC, HGB,HCT)
Glucose, Bld: 82 mg/dL (ref 70–99)
HCT: 44 % (ref 36.0–46.0)
Hemoglobin: 15 g/dL (ref 12.0–15.0)
Potassium: 3.6 mEq/L (ref 3.5–5.1)
Sodium: 145 mEq/L (ref 135–145)

## 2010-05-12 LAB — URINE MICROSCOPIC-ADD ON

## 2010-05-31 LAB — TROPONIN I: Troponin I: 0.02 ng/mL (ref 0.00–0.06)

## 2010-05-31 LAB — D-DIMER, QUANTITATIVE (NOT AT ARMC): D-Dimer, Quant: 0.41 ug/mL-FEU (ref 0.00–0.48)

## 2010-06-04 LAB — GLUCOSE, CAPILLARY: Glucose-Capillary: 89 mg/dL (ref 70–99)

## 2010-06-04 LAB — URINALYSIS, ROUTINE W REFLEX MICROSCOPIC
Bilirubin Urine: NEGATIVE
Glucose, UA: NEGATIVE mg/dL
Hgb urine dipstick: NEGATIVE
Ketones, ur: NEGATIVE mg/dL
Nitrite: NEGATIVE
Protein, ur: NEGATIVE mg/dL
Specific Gravity, Urine: 1.024 (ref 1.005–1.030)
Urobilinogen, UA: 0.2 mg/dL (ref 0.0–1.0)
pH: 5 (ref 5.0–8.0)

## 2010-07-12 NOTE — Assessment & Plan Note (Signed)
[facility]                         GASTROENTEROLOGY OFFICE NOTE   NAME:[name], [name]                     MRN:          [id number]  DATE:07/11/2006                            DOB:          1961-01-02    [name] [name] has had very good control of her reflux symptoms on  Aciphex.  Erosive esophagitis was noted on upper endoscopy.  Constipation has improved slightly on a high-fiber diet, but she still  has persistent problems with constipation and abdominal bloating.  The  colon polyp removed was hyperplastic.   CURRENT MEDICATIONS:  Listed on the chart, updated and reviewed.   MEDICATION ALLERGIES:  None known.   PHYSICAL EXAMINATION:  No acute distress.  Weight 128.2 pounds, blood pressure 96/60, pulse is 72 and regular.  She is not reexamined.   ASSESSMENT AND PLAN:  1. Gastroesophageal reflux disease with erosive esophagitis.  Maintain      standard antireflux measures and Aciphex 20 mg p.o. q.a.m.  Return      office visit in 1 year.  2. Constipation and abdominal bloating.  Continue a high-fiber diet      with adequate water intake.  Begin daily fiber supplements and add      a stool softener if this is not adequate.  In addition, she may use      milk of magnesia or MiraLax on a regular basis as needed.     Venita Lick. Russella Dar, MD, [facility]  Electronically Signed    MTS/MedQ  DD: 07/11/2006  DT: 07/11/2006  Job #: [id number]   cc:   Olene Craven, M.D.

## 2010-07-15 NOTE — Assessment & Plan Note (Signed)
[facility]                         GASTROENTEROLOGY OFFICE NOTE   NAME:[name] Walker, [name] Walker                     MRN:          [id number]  DATE:05/16/2006                            DOB:          [birth year]    REFERRING PHYSICIAN:  Olene Craven, M.D.   REASON FOR REFERRAL:  Left lower quadrant pain, constipation and an  abnormal CT scan of the pancreas.   HISTORY OF PRESENT ILLNESS:  [name] Walker is a 50 year old female,  referred through the courtesy of Dr. Barbee Shropshire.  She relates significant  problems with heartburn, indigestion and intermittent vomiting, related  to meals and spicy foods.  These problems have been present for about a  year.  She has had problems with left lower quadrant and left mid  abdominal pain that is recurrent and sharp.  It does not appear to  change with bowel function or meals.  She has had ongoing fairly severe  lower back pain that radiates to both hips.  She was started on  hydrocodone for management of her pain and, since starting hydrocodone,  she has noted problems with constipation.  She notes no change in stool  caliber, melena or hematochezia.  She underwent a CT scan of the abdomen  and pelvis at [facility] on March 19, 2006, which showed right  nephrolithiasis.  The pancreatic head region appeared to be generous,  however, there was limitation in the exam, due to overlying unopacified  duodenum.  Constipation was also noted.  An abdominal ultrasound was  performed on April 27, 2006, at [facility], which revealed  small right renal calculi, which did not appear to be obstructing, and  the pancreas head as well as the rest of the pancreas appeared to be  normal by ultrasound.   There is no family history of colon cancer, colon polyps or inflammatory  bowel disease.   PAST MEDICAL HISTORY:  Hypertension, low back pain, nephrolithiasis,  allergies, status post bilateral tubal ligation.   CURRENT MEDICATIONS:  Listed on the chart, have been reviewed.   SOCIAL HISTORY:  As per the handwritten form.   REVIEW OF SYSTEMS:  As per the handwritten form.   PHYSICAL EXAM:  No acute distress.  Height 5 feet 2 inches, weight 123.4  pounds.  Blood pressure is 86/58, pulse 76 and regular.  HEENT EXAM:  Anicteric sclerae.  Oropharynx clear.  CHEST:  Clear to auscultation bilaterally.  CARDIAC:  Regular rate and rhythm without murmurs appreciated.  ABDOMEN:  Soft, nondistended with mild left lower quadrant tenderness to  deep palpation, no rebound or guarding, no palpable organomegaly, masses  or hernias.  Normoactive bowel sounds.  BACK EXAM:  Reveals no spinal, paraspinal or CVA tenderness.  RECTAL EXAM:  Deferred to time of colonoscopy.  EXTREMITIES:  Without clubbing, cyanosis or edema.  NEUROLOGIC:  Alert and oriented times three.  Grossly nonfocal.   ASSESSMENT AND PLAN:  1. Back pain, associated with left lower quadrant pain.  Need to      exclude musculoskeletal etiologies, referred pain from her back or      left hip and  gynecologic disorders.  She is to return to Dr.      Barbee Shropshire for further evaluation. Need to exclude colorectal      disorders leading to LLQ pain, but I feel these are less likely.      Risks, benefits, and alternatives to colonoscopy and possible      biopsy and possible polypectomy discussed with the patient and she      consents to proceed.  This will be scheduled electively.  2. Constipation.  This appears to be due to pain medications.  She was      advised to use Milk of Magnesia or MiraLax twice a day as needed.      As above, further evaluation with colonoscopy to rule out      colorectal disorders. Minimize or discontinue Hydrocodone if      possible.  3. Abnormal pancreas on CT scan.  This is likely related to      unopacified duodenum, as the abdominal ultrasound of the pancreas      appeared normal.  No further evaluation needed at this  time.  4. GERD.  Begin standard antireflux measures and Prilosec 20 mg OTC      one p.o. q.a.m.  If these symptoms do not come under adequate      control, consider upper endoscopy for further evaluation.     [name] Walker. Russella Dar, MD, [facility]  Electronically Signed    MTS/MedQ  DD: 05/16/2006  DT: 05/16/2006  Job #: [id number]

## 2010-08-23 ENCOUNTER — Encounter: Payer: Self-pay | Admitting: Internal Medicine

## 2010-08-23 ENCOUNTER — Other Ambulatory Visit: Payer: Self-pay | Admitting: Internal Medicine

## 2010-08-23 ENCOUNTER — Other Ambulatory Visit: Payer: Self-pay

## 2010-08-23 ENCOUNTER — Other Ambulatory Visit (INDEPENDENT_AMBULATORY_CARE_PROVIDER_SITE_OTHER): Payer: BC Managed Care – PPO

## 2010-08-23 ENCOUNTER — Ambulatory Visit (INDEPENDENT_AMBULATORY_CARE_PROVIDER_SITE_OTHER): Payer: BC Managed Care – PPO | Admitting: Internal Medicine

## 2010-08-23 VITALS — BP 130/88 | HR 75 | Temp 97.3°F | Ht 62.0 in | Wt 140.0 lb

## 2010-08-23 DIAGNOSIS — Z0000 Encounter for general adult medical examination without abnormal findings: Secondary | ICD-10-CM

## 2010-08-23 DIAGNOSIS — M79609 Pain in unspecified limb: Secondary | ICD-10-CM

## 2010-08-23 DIAGNOSIS — M722 Plantar fascial fibromatosis: Secondary | ICD-10-CM

## 2010-08-23 DIAGNOSIS — M545 Low back pain, unspecified: Secondary | ICD-10-CM

## 2010-08-23 DIAGNOSIS — J309 Allergic rhinitis, unspecified: Secondary | ICD-10-CM

## 2010-08-23 DIAGNOSIS — M79604 Pain in right leg: Secondary | ICD-10-CM

## 2010-08-23 DIAGNOSIS — M79605 Pain in left leg: Secondary | ICD-10-CM | POA: Insufficient documentation

## 2010-08-23 DIAGNOSIS — Z0001 Encounter for general adult medical examination with abnormal findings: Secondary | ICD-10-CM | POA: Insufficient documentation

## 2010-08-23 LAB — BASIC METABOLIC PANEL
BUN: 16 mg/dL (ref 6–23)
CO2: 27 mEq/L (ref 19–32)
Calcium: 9.5 mg/dL (ref 8.4–10.5)
Chloride: 109 mEq/L (ref 96–112)
Creatinine, Ser: 0.5 mg/dL (ref 0.4–1.2)
GFR: 163.73 mL/min (ref >60.00)
Glucose, Bld: 81 mg/dL (ref 70–99)
Potassium: 4.2 mEq/L (ref 3.5–5.1)
Sodium: 143 mEq/L (ref 135–145)

## 2010-08-23 LAB — TSH: TSH: 0.56 u[IU]/mL (ref 0.35–5.50)

## 2010-08-23 LAB — CBC WITH DIFFERENTIAL/PLATELET
Basophils Absolute: 0.1 10*3/uL (ref 0.0–0.1)
Basophils Relative: 1.7 % (ref 0.0–3.0)
Eosinophils Absolute: 0.1 10*3/uL (ref 0.0–0.7)
Eosinophils Relative: 2 % (ref 0.0–5.0)
HCT: 43 % (ref 36.0–46.0)
Hemoglobin: 14.5 g/dL (ref 12.0–15.0)
Lymphocytes Relative: 45.9 % (ref 12.0–46.0)
Lymphs Abs: 3 10*3/uL (ref 0.7–4.0)
MCHC: 33.7 g/dL (ref 30.0–36.0)
MCV: 88.7 fl (ref 78.0–100.0)
Monocytes Absolute: 0.7 10*3/uL (ref 0.1–1.0)
Monocytes Relative: 11.3 % (ref 3.0–12.0)
Neutro Abs: 2.6 10*3/uL (ref 1.4–7.7)
Neutrophils Relative %: 39.1 % — ABNORMAL LOW (ref 43.0–77.0)
Platelets: 131 10*3/uL — ABNORMAL LOW (ref 150.0–400.0)
RBC: 4.85 Mil/uL (ref 3.87–5.11)
RDW: 14.7 % — ABNORMAL HIGH (ref 11.5–14.6)
WBC: 6.6 10*3/uL (ref 4.5–10.5)

## 2010-08-23 LAB — URINALYSIS, ROUTINE W REFLEX MICROSCOPIC
Bilirubin Urine: NEGATIVE
Ketones, ur: NEGATIVE
Leukocytes, UA: NEGATIVE
Nitrite: NEGATIVE
Specific Gravity, Urine: 1.025 (ref 1.000–1.030)
Total Protein, Urine: NEGATIVE
Urine Glucose: NEGATIVE
Urobilinogen, UA: 0.2 (ref 0.0–1.0)
pH: 5.5 (ref 5.0–8.0)

## 2010-08-23 LAB — HEPATIC FUNCTION PANEL
ALT: 27 U/L (ref 0–35)
AST: 23 U/L (ref 0–37)
Albumin: 4.4 g/dL (ref 3.5–5.2)
Alkaline Phosphatase: 111 U/L (ref 39–117)
Bilirubin, Direct: 0 mg/dL (ref 0.0–0.3)
Total Bilirubin: 0.3 mg/dL (ref 0.3–1.2)
Total Protein: 7.2 g/dL (ref 6.0–8.3)

## 2010-08-23 LAB — LIPID PANEL
Cholesterol: 169 mg/dL (ref 0–200)
HDL: 40.4 mg/dL (ref >39.00)
Total CHOL/HDL Ratio: 4
Triglycerides: 335 mg/dL — ABNORMAL HIGH (ref 0.0–149.0)
VLDL: 67 mg/dL — ABNORMAL HIGH (ref 0.0–40.0)

## 2010-08-23 MED ORDER — NAPROXEN 500 MG PO TABS: 500.0000 mg | ORAL_TABLET | Freq: Two times a day (BID) | ORAL | Status: DC

## 2010-08-23 MED ORDER — CYCLOBENZAPRINE HCL 5 MG PO TABS: 5.0000 mg | ORAL_TABLET | Freq: Three times a day (TID) | ORAL | Status: AC | PRN

## 2010-08-23 MED ORDER — RISPERIDONE 0.25 MG PO TABS: ORAL_TABLET | ORAL | Status: DC

## 2010-08-23 MED ORDER — CETIRIZINE HCL 10 MG PO TABS: 10.0000 mg | ORAL_TABLET | Freq: Every day | ORAL | Status: DC

## 2010-08-23 NOTE — Assessment & Plan Note (Signed)
C/w msk strain - for flexeril prn

## 2010-08-23 NOTE — Progress Notes (Signed)
Subjective:    Patient ID: [name] Walker, female    DOB: [birth year], 50 y.o.   MRN: [id number]  HPI Here for wellness and f/u;  Overall doing ok;  Pt denies CP, worsening SOB, DOE, wheezing, orthopnea, PND, worsening LE edema, palpitations, dizziness or syncope.  Pt denies neurological change such as new Headache, facial or extremity weakness.  Pt denies polydipsia, polyuria, or low sugar symptoms. Pt states overall good compliance with treatment and medications, good tolerability, and trying to follow lower cholesterol diet.  Pt denies worsening depressive symptoms, suicidal ideation or panic. No fever, wt loss, night sweats, loss of appetite, or other constitutional symptoms.  Pt states good ability with ADL's, low fall risk, home safety reviewed and adequate, no significant changes in hearing or vision, and occasionally active with exercise. Does have right foot plantar fasciitis chronic for over 1 yr, despite seeing and tx per podiatry.  Pt continues to have recurring mild right LBP without change in severity, bowel or bladder change, fever, wt loss,  worsening LE pain/numbness/weakness, gait change or falls, for several months.  Does have several wks ongoing nasal allergy symptoms with clear congestion, itch and sneeze, without fever, pain, ST, cough or wheezing. Also with bilat LE pain and cramps to bilat legs/calves during night, occas during the day, worse with walking during the day. Past Medical History  Diagnosis Date  . Abdominal pain, generalized 11/08/2009  . ACHILLES TENDINITIS 09/01/2008  . ALLERGIC RHINITIS 07/12/2007  . ANKLE PAIN, BILATERAL 10/19/2008  . COLONIC POLYPS, HX OF 09/19/2007  . GERD 07/12/2007  . HOT FLASHES 08/18/2008  . HYPERTENSION 07/12/2007  . JOINT EFFUSION, ANKLE 09/10/2008  . LATERAL EPICONDYLITIS, RIGHT 08/18/2008  . LOW BACK PAIN 07/12/2007  . NEPHROLITHIASIS, HX OF 07/12/2007  . Pain in joint, upper arm 09/10/2008  . Pain in Soft Tissues of Limb 09/19/2007  .  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 SUBACROMIAL BURSITIS, RIGHT 09/01/2008  . VAGINITIS 09/10/2008   Past Surgical History  Procedure Date  . Tubal ligation     reports that she has been smoking.  She does not have any smokeless tobacco history on file. She reports that she does not drink alcohol. Her drug history not on file. family history includes Arthritis in her other; Cancer in her other and sister; Diabetes in her other; Hypertension in her other; and Stroke in her other. Allergies  Allergen Reactions  . Tramadol     REACTION: sleepy and hyper   No current outpatient prescriptions on file prior to visit.   Review of Systems Review of Systems  Constitutional: Negative for diaphoresis, activity change, appetite change and unexpected weight change.  HENT: Negative for hearing loss, ear pain, facial swelling, mouth sores and neck stiffness.   Eyes: Negative for pain, redness and visual disturbance.  Respiratory: Negative for shortness of breath and wheezing.   Cardiovascular: Negative for chest pain and palpitations.  Gastrointestinal: Negative for diarrhea, blood in stool, abdominal distention and rectal pain.  Genitourinary: Negative for hematuria, flank pain and decreased urine volume.  Musculoskeletal: Negative for myalgias and joint swelling.  Skin: Negative for color change and wound.  Neurological: Negative for syncope and numbness.  Hematological: Negative for adenopathy.  Psychiatric/Behavioral: Negative for hallucinations, self-injury, decreased concentration and agitation.       Objective:   Physical Exam BP 130/88  Pulse 75  Temp(Src) 97.3 F (36.3 C) (Oral)  Ht 5\' 2"  (1.575 m)  Wt 140 lb (63.504 kg)  BMI 25.61  kg/m2  SpO2 98%  LMP 08/22/2010 Physical Exam  VS noted Constitutional: Pt is oriented to person, place, and time. Appears well-developed and well-nourished.  HENT:  Head: Normocephalic and atraumatic.  Right Ear: External ear normal.  Left  Ear: External ear normal.  Nose: Nose normal.  Mouth/Throat: Oropharynx is clear and moist.  Eyes: Conjunctivae and EOM are normal. Pupils are equal, round, and reactive to light.  Neck: Normal range of motion. Neck supple. No JVD present. No tracheal deviation present.  Cardiovascular: Normal rate, regular rhythm, normal heart sounds and intact distal pulses.   Pulmonary/Chest: Effort normal and breath sounds normal.  Abdominal: Soft. Bowel sounds are normal. There is no tenderness.  Musculoskeletal: Normal range of motion. Exhibits no edema. Has mild right lumbar paravertebral tender; tender right heel and instep Lymphadenopathy:  Has no cervical adenopathy.  Neurological: Pt is alert and oriented to person, place, and time. Pt has normal reflexes. No cranial nerve deficit. Motor/dtr's intact to LE's Skin: Skin is warm and dry. No rash noted. dorsalis pedis 1+ bilat Psychiatric:  . Behavior is normal. 1+ nervous        Assessment & Plan:

## 2010-08-23 NOTE — Assessment & Plan Note (Signed)
Chronic persistent - for ortho referral

## 2010-08-23 NOTE — Assessment & Plan Note (Addendum)
Cant r/o claudication in former smoker - for LE art dopplers, to check labs as well

## 2010-08-23 NOTE — Assessment & Plan Note (Signed)
For zyrtec refill,  to f/u any worsening symptoms or concerns  

## 2010-08-23 NOTE — Assessment & Plan Note (Addendum)
Overall doing well, age appropriate education and counseling updated, referrals for preventative services and immunizations addressed, dietary and smoking counseling addressed, most recent labs and ECG reviewed.  I have personally reviewed and have noted: 1) the patient's medical and social history 2) The pt's use of alcohol, tobacco, and illicit drugs 3) The patient's current medications and supplements 4) Functional ability including ADL's, fall risk, home safety risk, hearing and visual impairment 5) Diet and physical activities 6) Evidence for depression or mood disorder 7) The patient's height, weight, and BMI have been recorded in the chart Declines tetanus I have made referrals, and provided counseling and education based on review of the above

## 2010-08-23 NOTE — Patient Instructions (Addendum)
Take all new medications as prescribed - the anti-inflammatory, muscle relaxer, and zyrtec as needed Continue all other medications as before, except stop the tramadol You will be contacted regarding the referral for: Leg circulation test (arterial dopplers), and orthopedic (Dr Lestine Box) Please return in 1 year for your yearly visit, or sooner if needed, with Lab testing done 3-5 days before

## 2010-08-24 LAB — LDL CHOLESTEROL, DIRECT: Direct LDL: 86 mg/dL

## 2010-09-05 ENCOUNTER — Other Ambulatory Visit: Payer: Self-pay | Admitting: Internal Medicine

## 2010-09-05 ENCOUNTER — Ambulatory Visit (INDEPENDENT_AMBULATORY_CARE_PROVIDER_SITE_OTHER): Payer: BC Managed Care – PPO | Admitting: Cardiology

## 2010-09-05 DIAGNOSIS — I739 Peripheral vascular disease, unspecified: Secondary | ICD-10-CM

## 2010-09-05 DIAGNOSIS — M79604 Pain in right leg: Secondary | ICD-10-CM

## 2010-09-08 ENCOUNTER — Encounter: Payer: Self-pay | Admitting: Internal Medicine

## 2010-09-08 NOTE — Progress Notes (Signed)
Quick Note:  Voice message left on PhoneTree system - lab is negative, normal or otherwise stable, pt to continue same tx ______ 

## 2010-10-04 ENCOUNTER — Telehealth: Payer: Self-pay | Admitting: *Deleted

## 2010-10-04 NOTE — Telephone Encounter (Signed)
Patient requesting results of her recent tests. C/o continued achy legs.

## 2010-10-05 NOTE — Telephone Encounter (Signed)
Called patient; left message to call back.

## 2010-10-05 NOTE — Telephone Encounter (Signed)
Informed patient of results of Doppler on 09/05/2010. Informed also Dr. Jonny Ruiz Left message on the phone tree. Patient's legs are still hurting. Informed to schedule followup with Dr. Jonny Ruiz, patient will call back to do so.

## 2010-11-23 LAB — CBC
HCT: 43.1
Hemoglobin: 14.3
MCHC: 33.1
MCV: 86.8
Platelets: 131 — ABNORMAL LOW
RBC: 4.96
RDW: 14.3
WBC: 8.6

## 2010-11-23 LAB — DIFFERENTIAL
Basophils Absolute: 0.1
Basophils Relative: 1
Eosinophils Absolute: 0.1
Eosinophils Relative: 2
Lymphocytes Relative: 26
Lymphs Abs: 2.3
Monocytes Absolute: 0.5
Monocytes Relative: 6
Neutro Abs: 5.7
Neutrophils Relative %: 65

## 2010-11-23 LAB — URIC ACID: Uric Acid, Serum: 2.7

## 2011-02-11 ENCOUNTER — Other Ambulatory Visit: Payer: Self-pay | Admitting: Internal Medicine

## 2011-07-07 ENCOUNTER — Ambulatory Visit (INDEPENDENT_AMBULATORY_CARE_PROVIDER_SITE_OTHER): Payer: BC Managed Care – PPO | Admitting: Internal Medicine

## 2011-07-07 ENCOUNTER — Encounter: Payer: Self-pay | Admitting: Internal Medicine

## 2011-07-07 VITALS — BP 124/110 | HR 73 | Temp 97.7°F | Ht 62.0 in | Wt 143.0 lb

## 2011-07-07 DIAGNOSIS — J309 Allergic rhinitis, unspecified: Secondary | ICD-10-CM

## 2011-07-07 DIAGNOSIS — J069 Acute upper respiratory infection, unspecified: Secondary | ICD-10-CM

## 2011-07-07 MED ORDER — CETIRIZINE HCL 10 MG PO TABS: 10.0000 mg | ORAL_TABLET | Freq: Every day | ORAL | Status: DC

## 2011-07-07 MED ORDER — PROMETHAZINE-PHENYLEPHRINE 6.25-5 MG/5ML PO SYRP: 5.0000 mL | ORAL_SOLUTION | ORAL | Status: DC | PRN

## 2011-07-07 MED ORDER — FLUTICASONE PROPIONATE 50 MCG/ACT NA SUSP: 2.0000 | Freq: Every day | NASAL | Status: DC

## 2011-07-07 NOTE — Patient Instructions (Signed)
It was good to see you today. If you develop worsening symptoms or fever, call and we can reconsider antibiotics, but it does not appear necessary to use antibiotics at this time. Promethazine VC syrup for cough and congestion, Zyrtec everyday and Flonase nose spray every day - Your prescription(s) have been submitted to your pharmacy. Please take as directed and contact our office if you believe you are having problem(s) with the medication(s). Alternate between ibuprofen and tylenol for aches, pain and fever symptoms as discussed

## 2011-07-07 NOTE — Progress Notes (Signed)
  Subjective:    HPI  complains of head cold symptoms  Onset 2 days ago, wax/wane symptoms  associated with rhinorrhea, sneezing, congestion, sore throat, mild headache and myalgias mild sinus pressure and mild-mod chest congestion Not using any OTC meds Precipitated by season change and sick contacts  Past Medical History  Diagnosis Date  . ACHILLES TENDINITIS   . ALLERGIC RHINITIS   . GERD   . HYPERTENSION   . LATERAL EPICONDYLITIS, RIGHT   . LOW BACK PAIN   . Plantar fascial fibromatosis   . SUBACROMIAL BURSITIS, RIGHT     Review of Systems Constitutional: No fever, night sweats, no unexpected weight change Pulmonary: No pleurisy or hemoptysis Cardiovascular: No chest pain or palpitations     Objective:   Physical Exam BP 124/110  Pulse 73  Temp(Src) 97.7 F (36.5 C) (Temporal)  Ht 5\' 2"  (1.575 m)  Wt 143 lb (64.864 kg)  BMI 26.15 kg/m2  SpO2 99% GEN: nontoxic appearing but audible head/chest congestion HENT: NCAT, mild sinus tenderness bilaterally, nares with clear discharge, oropharynx mild erythema, no exudate Eyes: Vision grossly intact, no conjunctivitis Lungs: Clear to auscultation without rhonchi or wheeze, no increased work of breathing Cardiovascular: Regular rate and rhythm, no bilateral edema      Assessment & Plan:  Viral URI  allergic rhinitis  Cough, postnasal drip related to above   Explained lack of efficacy for antibiotics in viral disease Will reconsider empiric antibiotics if symptom duration greater than 7 days - pt to call Zyrtec, prometh VC and flonase - erx done Symptomatic care with Tylenol or Advil, hydration and rest -  salt gargle advised as needed

## 2012-03-01 ENCOUNTER — Other Ambulatory Visit (INDEPENDENT_AMBULATORY_CARE_PROVIDER_SITE_OTHER): Payer: BC Managed Care – PPO

## 2012-03-01 ENCOUNTER — Ambulatory Visit (INDEPENDENT_AMBULATORY_CARE_PROVIDER_SITE_OTHER): Payer: BC Managed Care – PPO | Admitting: Internal Medicine

## 2012-03-01 ENCOUNTER — Encounter: Payer: Self-pay | Admitting: Internal Medicine

## 2012-03-01 VITALS — BP 140/100 | HR 87 | Temp 97.8°F | Ht 62.0 in | Wt 148.5 lb

## 2012-03-01 DIAGNOSIS — I10 Essential (primary) hypertension: Secondary | ICD-10-CM

## 2012-03-01 DIAGNOSIS — R350 Frequency of micturition: Secondary | ICD-10-CM

## 2012-03-01 DIAGNOSIS — M545 Low back pain, unspecified: Secondary | ICD-10-CM

## 2012-03-01 DIAGNOSIS — Z0000 Encounter for general adult medical examination without abnormal findings: Secondary | ICD-10-CM

## 2012-03-01 DIAGNOSIS — N3281 Overactive bladder: Secondary | ICD-10-CM | POA: Insufficient documentation

## 2012-03-01 DIAGNOSIS — J019 Acute sinusitis, unspecified: Secondary | ICD-10-CM

## 2012-03-01 LAB — URINALYSIS, ROUTINE W REFLEX MICROSCOPIC
Bilirubin Urine: NEGATIVE
Ketones, ur: NEGATIVE
Leukocytes, UA: NEGATIVE
Nitrite: NEGATIVE
Specific Gravity, Urine: 1.025 (ref 1.000–1.030)
Total Protein, Urine: NEGATIVE
Urine Glucose: NEGATIVE
Urobilinogen, UA: 0.2 (ref 0.0–1.0)
pH: 6 (ref 5.0–8.0)

## 2012-03-01 MED ORDER — PANTOPRAZOLE SODIUM 40 MG PO TBEC: 40.0000 mg | DELAYED_RELEASE_TABLET | Freq: Every day | ORAL | Status: DC

## 2012-03-01 MED ORDER — LEVOFLOXACIN 250 MG PO TABS: 250.0000 mg | ORAL_TABLET | Freq: Every day | ORAL | Status: DC

## 2012-03-01 MED ORDER — HYDROCODONE-HOMATROPINE 5-1.5 MG/5ML PO SYRP: 5.0000 mL | ORAL_SOLUTION | Freq: Four times a day (QID) | ORAL | Status: DC | PRN

## 2012-03-01 MED ORDER — IBUPROFEN 800 MG PO TABS: 800.0000 mg | ORAL_TABLET | Freq: Three times a day (TID) | ORAL | Status: DC | PRN

## 2012-03-01 NOTE — Assessment & Plan Note (Addendum)
ECG reviewed as per emr, mild elev today but stable overall by hx and exam, most recent data reviewed with pt, and pt to continue medical treatment as before - declines med increase today BP Readings from Last 3 Encounters:  03/01/12 140/100  07/07/11 124/110  08/23/10 130/88

## 2012-03-01 NOTE — Patient Instructions (Addendum)
Take all new medications as prescribed - the antibiotic, and cough medicine, and ibuprofen as needed for pain, and the reflux medicine We will send the urine tests to be done today Continue all other medications as before Please have the pharmacy call with any other refills you may need. Please continue your efforts at being more active, low cholesterol diet, and weight control. Please remember to sign up for My Chart at your earliest convenience, as this will be important to you in the future with finding out test results. Please return in 3 mo with Lab testing done 3-5 days before

## 2012-03-02 ENCOUNTER — Encounter: Payer: Self-pay | Admitting: Internal Medicine

## 2012-03-02 DIAGNOSIS — J019 Acute sinusitis, unspecified: Secondary | ICD-10-CM | POA: Insufficient documentation

## 2012-03-02 NOTE — Assessment & Plan Note (Signed)
Exam benign, suspect underlying lumbar degen dz, for ibuprofen prn,  to f/u any worsening symptoms or concerns

## 2012-03-02 NOTE — Assessment & Plan Note (Signed)
Mild to mod, for antibx course,  to f/u any worsening symptoms or concerns 

## 2012-03-02 NOTE — Progress Notes (Signed)
Subjective:    Patient ID: [name] Walker, female    DOB: Jul 23, 1960, 52 y.o.   MRN: [id number]  HPI   Here with 3 days acute onset fever, facial pain, pressure, general weakness and malaise, and greenish d/c, with slight ST, but little to no cough and Pt denies chest pain, increased sob or doe, wheezing, orthopnea, PND, increased LE swelling, palpitations, dizziness or syncope.  Also - Pt continues to have recurring right > left LBP without change in severity, bowel or bladder change, fever, wt loss,  worsening LE pain/numbness/weakness, gait change or falls, but pain worse in last 3-4 days.  Also with mild urinary frequency of unclear significacne, without dysuria, fever, abd pain, n/v, chills, hematuria.  Has had mild worsening reflux, without dysphagia, abd pain, n/v, bowel change or blood. Past Medical History  Diagnosis Date  . ACHILLES TENDINITIS   . ALLERGIC RHINITIS   . GERD   . HYPERTENSION   . LATERAL EPICONDYLITIS, RIGHT   . LOW BACK PAIN   . Plantar fascial fibromatosis   . SUBACROMIAL BURSITIS, RIGHT    Past Surgical History  Procedure Date  . Tubal ligation     reports that she has been smoking.  She does not have any smokeless tobacco history on file. She reports that she does not drink alcohol. Her drug history not on file. family history includes Arthritis in her other; Cancer in her other and sister; Diabetes in her other; Hypertension in her other; and Stroke in her other. Allergies  Allergen Reactions  . Tramadol     REACTION: sleepy and hyper   Current Outpatient Prescriptions on File Prior to Visit  Medication Sig Dispense Refill  . cetirizine (ZYRTEC) 10 MG tablet Take 1 tablet (10 mg total) by mouth daily.  30 tablet  3  . fluticasone (FLONASE) 50 MCG/ACT nasal spray Place 2 sprays into the nose daily.  16 g  6  . lisinopril (PRINIVIL,ZESTRIL) 20 MG tablet TAKE ONE TABLET BY MOUTH EVERY DAY  90 tablet  3  . promethazine (PHENERGAN) 25 MG tablet Take 25 mg by  mouth every 6 (six) hours as needed. For nausea        . promethazine-phenylephrine (PROMETHAZINE-PHENYLEPHRINE) 6.25-5 MG/5ML SYRP Take 5 mLs by mouth every 4 (four) hours as needed (cough and congestion).  180 mL  0  . risperiDONE (RISPERDAL) 0.25 MG tablet 1 tablet by mouth every morning and 2 tablets by mouth every evening  90 tablet  5  . sertraline (ZOLOFT) 50 MG tablet Take 50 mg by mouth daily.        . naproxen (NAPROSYN) 500 MG tablet Take 1 tablet (500 mg total) by mouth 2 (two) times daily with a meal.  60 tablet  2  . pantoprazole (PROTONIX) 40 MG tablet Take 1 tablet (40 mg total) by mouth daily.  90 tablet  3   Review of Systems  Constitutional: Negative for diaphoresis and unexpected weight change.  HENT: Negative for tinnitus.   Eyes: Negative for photophobia and visual disturbance.  Respiratory: Negative for choking and stridor.   Gastrointestinal: Negative for vomiting and blood in stool.  Genitourinary: Negative for hematuria and decreased urine volume.  Musculoskeletal: Negative for gait problem.  Skin: Negative for color change and wound.  Neurological: Negative for tremors and numbness.  Psychiatric/Behavioral: Negative for decreased concentration. The patient is not hyperactive.       Objective:   Physical Exam BP 140/100  Pulse 87  Temp 97.8  F (36.6 C) (Oral)  Ht 5\' 2"  (1.575 m)  Wt 148 lb 8 oz (67.359 kg)  BMI 27.16 kg/m2  SpO2 93% Physical Exam  VS noted, mild ill Constitutional: Pt appears well-developed and well-nourished.  HENT: Head: Normocephalic.  Right Ear: External ear normal.  Left Ear: External ear normal.  Bilat tm's mild erythema.  Sinus tender.  Pharynx mild erythema Eyes: Conjunctivae and EOM are normal. Pupils are equal, round, and reactive to light.  Neck: Normal range of motion. Neck supple.  Cardiovascular: Normal rate and regular rhythm.   Pulmonary/Chest: Effort normal and breath sounds normal.  Abd:  Soft, NT, non-distended, +  BS Neurological: Pt is alert. Not confused , spine nontender, motor/gait intact Skin: Skin is warm. No erythema.  Psychiatric: Pt behavior is normal. Thought content normal.     Assessment & Plan:

## 2012-03-02 NOTE — Assessment & Plan Note (Signed)
Unclear etiology - for UA,  to f/u any worsening symptoms or concerns

## 2012-03-04 ENCOUNTER — Encounter: Payer: Self-pay | Admitting: Internal Medicine

## 2012-03-22 ENCOUNTER — Other Ambulatory Visit: Payer: Self-pay | Admitting: Internal Medicine

## 2012-06-29 ENCOUNTER — Ambulatory Visit (INDEPENDENT_AMBULATORY_CARE_PROVIDER_SITE_OTHER): Payer: BC Managed Care – PPO | Admitting: Family Medicine

## 2012-06-29 ENCOUNTER — Encounter: Payer: Self-pay | Admitting: Family Medicine

## 2012-06-29 VITALS — BP 140/98 | Temp 98.3°F | Wt 145.0 lb

## 2012-06-29 DIAGNOSIS — M79609 Pain in unspecified limb: Secondary | ICD-10-CM

## 2012-06-29 DIAGNOSIS — M79604 Pain in right leg: Secondary | ICD-10-CM | POA: Insufficient documentation

## 2012-06-29 DIAGNOSIS — M79672 Pain in left foot: Secondary | ICD-10-CM

## 2012-06-29 NOTE — Progress Notes (Signed)
  Subjective:    Patient ID: [name] Walker, female    DOB: 1960-10-11, 52 y.o.   MRN: [id number]  HPI Shekinah is a 52 year old female smoker one pack a day who has a history of underlying allergic rhinitis, hypertension, reflux esophagitis, depression, who comes in today for evaluation of a two-week history of bilateral leg pain. She states 2 weeks ago her legs begin to ache. No history of trauma. She's never had a problem like this before.   Review of Systems    review of systems otherwise negative except for smoking Objective:   Physical Exam Well-developed well-nourished female no acute distress examination lower extremities the legs appears normal pulses are 1+ and symmetrical. There is no palpable tenderness. In today she says when I palpate her calves it feels better!!!!!!!!!!!!!!       Assessment & Plan:  Bilateral lower extremity pain x2 weeks etiology unknown plan refer back to primary care physician for complete workup

## 2012-06-29 NOTE — Patient Instructions (Signed)
Take Motrin 600 mg twice daily with food  Stop smoking  Make an appointment to see your doctor next week for further evaluation of the bilateral leg pain

## 2012-06-30 ENCOUNTER — Other Ambulatory Visit: Payer: Self-pay | Admitting: Internal Medicine

## 2012-07-02 ENCOUNTER — Other Ambulatory Visit (INDEPENDENT_AMBULATORY_CARE_PROVIDER_SITE_OTHER): Payer: BC Managed Care – PPO

## 2012-07-02 ENCOUNTER — Ambulatory Visit (INDEPENDENT_AMBULATORY_CARE_PROVIDER_SITE_OTHER): Payer: BC Managed Care – PPO | Admitting: Internal Medicine

## 2012-07-02 ENCOUNTER — Encounter: Payer: Self-pay | Admitting: Internal Medicine

## 2012-07-02 VITALS — BP 122/88 | HR 74 | Temp 98.0°F | Ht 62.0 in | Wt 145.0 lb

## 2012-07-02 DIAGNOSIS — Z800 Family history of malignant neoplasm of digestive organs: Secondary | ICD-10-CM

## 2012-07-02 DIAGNOSIS — Z0000 Encounter for general adult medical examination without abnormal findings: Secondary | ICD-10-CM

## 2012-07-02 DIAGNOSIS — M79609 Pain in unspecified limb: Secondary | ICD-10-CM

## 2012-07-02 DIAGNOSIS — M79604 Pain in right leg: Secondary | ICD-10-CM

## 2012-07-02 LAB — LIPID PANEL
Cholesterol: 152 mg/dL (ref 0–200)
HDL: 31.9 mg/dL — ABNORMAL LOW (ref >39.00)
LDL Cholesterol: 85 mg/dL (ref 0–99)
Total CHOL/HDL Ratio: 5
Triglycerides: 176 mg/dL — ABNORMAL HIGH (ref 0.0–149.0)
VLDL: 35.2 mg/dL (ref 0.0–40.0)

## 2012-07-02 LAB — URINALYSIS, ROUTINE W REFLEX MICROSCOPIC
Bilirubin Urine: NEGATIVE
Ketones, ur: NEGATIVE
Leukocytes, UA: NEGATIVE
Nitrite: NEGATIVE
Specific Gravity, Urine: >=1.03 (ref 1.000–1.030)
Urine Glucose: NEGATIVE
Urobilinogen, UA: 1 (ref 0.0–1.0)
pH: 6 (ref 5.0–8.0)

## 2012-07-02 LAB — HEPATIC FUNCTION PANEL
ALT: 19 U/L (ref 0–35)
AST: 20 U/L (ref 0–37)
Albumin: 4 g/dL (ref 3.5–5.2)
Alkaline Phosphatase: 99 U/L (ref 39–117)
Bilirubin, Direct: 0.1 mg/dL (ref 0.0–0.3)
Total Bilirubin: 0.7 mg/dL (ref 0.3–1.2)
Total Protein: 7.2 g/dL (ref 6.0–8.3)

## 2012-07-02 LAB — CBC WITH DIFFERENTIAL/PLATELET
Basophils Absolute: 0 10*3/uL (ref 0.0–0.1)
Basophils Relative: 0.4 % (ref 0.0–3.0)
Eosinophils Absolute: 0.1 10*3/uL (ref 0.0–0.7)
Eosinophils Relative: 1.9 % (ref 0.0–5.0)
HCT: 45.6 % (ref 36.0–46.0)
Hemoglobin: 15.2 g/dL — ABNORMAL HIGH (ref 12.0–15.0)
Lymphocytes Relative: 51.4 % — ABNORMAL HIGH (ref 12.0–46.0)
Lymphs Abs: 3 10*3/uL (ref 0.7–4.0)
MCHC: 33.3 g/dL (ref 30.0–36.0)
MCV: 85.6 fl (ref 78.0–100.0)
Monocytes Absolute: 0.6 10*3/uL (ref 0.1–1.0)
Monocytes Relative: 10.5 % (ref 3.0–12.0)
Neutro Abs: 2.1 10*3/uL (ref 1.4–7.7)
Neutrophils Relative %: 35.8 % — ABNORMAL LOW (ref 43.0–77.0)
Platelets: 137 10*3/uL — ABNORMAL LOW (ref 150.0–400.0)
RBC: 5.32 Mil/uL — ABNORMAL HIGH (ref 3.87–5.11)
RDW: 14.4 % (ref 11.5–14.6)
WBC: 5.9 10*3/uL (ref 4.5–10.5)

## 2012-07-02 LAB — BASIC METABOLIC PANEL
BUN: 12 mg/dL (ref 6–23)
CO2: 27 mEq/L (ref 19–32)
Calcium: 9.2 mg/dL (ref 8.4–10.5)
Chloride: 109 mEq/L (ref 96–112)
Creatinine, Ser: 0.7 mg/dL (ref 0.4–1.2)
GFR: 110.95 mL/min (ref >60.00)
Glucose, Bld: 73 mg/dL (ref 70–99)
Potassium: 3.5 mEq/L (ref 3.5–5.1)
Sodium: 140 mEq/L (ref 135–145)

## 2012-07-02 LAB — TSH: TSH: 0.23 u[IU]/mL — ABNORMAL LOW (ref 0.35–5.50)

## 2012-07-02 MED ORDER — CETIRIZINE HCL 10 MG PO TABS: 10.0000 mg | ORAL_TABLET | Freq: Every day | ORAL | Status: DC

## 2012-07-02 MED ORDER — FLUTICASONE PROPIONATE 50 MCG/ACT NA SUSP: 2.0000 | Freq: Every day | NASAL | Status: DC

## 2012-07-02 MED ORDER — PANTOPRAZOLE SODIUM 40 MG PO TBEC: 40.0000 mg | DELAYED_RELEASE_TABLET | Freq: Every day | ORAL | Status: DC

## 2012-07-02 NOTE — Assessment & Plan Note (Signed)

## 2012-07-02 NOTE — Patient Instructions (Addendum)
OK to take the ibuprofen 600 mg as you have at home Please stop smoking You will be contacted regarding the referral for: Leg Circulation test (lower extremity arterial dopplers - this does not hurt) You will be contacted regarding the referral for: colonoscopy due to your Family History Please go to the LAB in the Basement (turn left off the elevator) for the tests to be done today You will be contacted by phone if any changes need to be made immediately.  Otherwise, you will receive a letter about your results with an explanation, but please check with MyChart first. Thank you for enrolling in MyChart. Please follow the instructions below to securely access your online medical record. MyChart allows you to send messages to your doctor, view your test results, renew your prescriptions, schedule appointments, and more. To Log into My Chart online, please go by [employer] or [employer] to [email or web address], or download the MyChart App from the [employer] of [employer] .  Your Username is: gina (pass [email or web address]) Please return in 6 months, or sooner if needed

## 2012-07-02 NOTE — Assessment & Plan Note (Signed)
Due for f/u colonscopy given FH - will help to arrange

## 2012-07-02 NOTE — Progress Notes (Signed)
Subjective:    Patient ID: [name] Walker, female    DOB: 1960-12-07, 52 y.o.   MRN: [id number]  HPI  Here for wellness and f/u;  Overall doing ok;  Pt denies CP, worsening SOB, DOE, wheezing, orthopnea, PND, worsening LE edema, palpitations, dizziness or syncope.  Pt denies neurological change such as new headache, facial or extremity weakness.  Pt denies polydipsia, polyuria, or low sugar symptoms. Pt states overall good compliance with treatment and medications, good tolerability, and has been trying to follow lower cholesterol diet.  Pt denies worsening depressive symptoms, suicidal ideation or panic. No fever, night sweats, wt loss, loss of appetite, or other constitutional symptoms.  Pt states good ability with ADL's, has low fall risk, home safety reviewed and adequate, no other significant changes in hearing or vision, and only occasionally active with exercise.   Here with 2 wks LE pain, hx of smoker, worse with ambulation, tender below knees as well without rash or swelling  Also menitons father and sister both with hx of colon ca Past Medical History  Diagnosis Date  . ACHILLES TENDINITIS   . ALLERGIC RHINITIS   . GERD   . HYPERTENSION   . LATERAL EPICONDYLITIS, RIGHT   . LOW BACK PAIN   . Plantar fascial fibromatosis   . SUBACROMIAL BURSITIS, RIGHT    Past Surgical History  Procedure Laterality Date  . Tubal ligation      reports that she has been smoking Cigarettes.  She has been smoking about 1.00 pack per day. She does not have any smokeless tobacco history on file. She reports that she does not drink alcohol. Her drug history is not on file. family history includes Arthritis in her other; Cancer in her other and sister; Diabetes in her other; Hypertension in her other; and Stroke in her other. Allergies  Allergen Reactions  . Tramadol     REACTION: sleepy and hyper   Current Outpatient Prescriptions on File Prior to Visit  Medication Sig Dispense Refill  . lisinopril  (PRINIVIL,ZESTRIL) 20 MG tablet TAKE ONE TABLET BY MOUTH EVERY DAY  90 tablet  2   No current facility-administered medications on file prior to visit.   Review of Systems Constitutional: Negative for diaphoresis, activity change, appetite change or unexpected weight change.  HENT: Negative for hearing loss, ear pain, facial swelling, mouth sores and neck stiffness.   Eyes: Negative for pain, redness and visual disturbance.  Respiratory: Negative for shortness of breath and wheezing.   Cardiovascular: Negative for chest pain and palpitations.  Gastrointestinal: Negative for diarrhea, blood in stool, abdominal distention or other pain Genitourinary: Negative for hematuria, flank pain or change in urine volume.  Musculoskeletal: Negative for myalgias and joint swelling.  Skin: Negative for color change and wound.  Neurological: Negative for syncope and numbness. other than noted Hematological: Negative for adenopathy.  Psychiatric/Behavioral: Negative for hallucinations, self-injury, decreased concentration and agitation.      Objective:   Physical Exam BP 122/88  Pulse 74  Temp(Src) 98 F (36.7 C) (Oral)  Ht 5\' 2"  (1.575 m)  Wt 145 lb (65.772 kg)  BMI 26.51 kg/m2  SpO2 99% VS noted,  Constitutional: Pt is oriented to person, place, and time. Appears well-developed and well-nourished.  Head: Normocephalic and atraumatic.  Right Ear: External ear normal.  Left Ear: External ear normal.  Nose: Nose normal.  Mouth/Throat: Oropharynx is clear and moist.  Eyes: Conjunctivae and EOM are normal. Pupils are equal, round, and reactive to light.  Neck: Normal range of motion. Neck supple. No JVD present. No tracheal deviation present.  Cardiovascular: Normal rate, regular rhythm, normal heart sounds and intact distal pulses.   Pulmonary/Chest: Effort normal and breath sounds normal.  Abdominal: Soft. Bowel sounds are normal. There is no tenderness. No HSM  Musculoskeletal: Normal range  of motion. Exhibits no edema.  Lymphadenopathy:  Has no cervical adenopathy.  Neurological: Pt is alert and oriented to person, place, and time. Pt has normal reflexes. No cranial nerve deficit.  Skin: Skin is warm and dry. No rash noted. Dorsalis pedis trace to 1+ bilat Psychiatric:  Has  normal mood and affect. Behavior is normal.     Assessment & Plan:

## 2012-07-02 NOTE — Assessment & Plan Note (Addendum)
?   msk vs vascular - for LE arterial dopplers urged to quit smoking, ibuprofen prn

## 2012-07-04 ENCOUNTER — Encounter (INDEPENDENT_AMBULATORY_CARE_PROVIDER_SITE_OTHER): Payer: BC Managed Care – PPO

## 2012-07-04 ENCOUNTER — Ambulatory Visit: Payer: BC Managed Care – PPO | Admitting: Internal Medicine

## 2012-07-04 DIAGNOSIS — M79604 Pain in right leg: Secondary | ICD-10-CM

## 2012-07-04 DIAGNOSIS — I70219 Atherosclerosis of native arteries of extremities with intermittent claudication, unspecified extremity: Secondary | ICD-10-CM

## 2012-07-08 ENCOUNTER — Encounter: Payer: Self-pay | Admitting: Internal Medicine

## 2012-07-08 ENCOUNTER — Telehealth: Payer: Self-pay

## 2012-07-08 DIAGNOSIS — R7989 Other specified abnormal findings of blood chemistry: Secondary | ICD-10-CM

## 2012-07-08 NOTE — Telephone Encounter (Signed)
Ok for labs to pt, all essentially normal except thyroid slightly off, meaning there might be mild overactive thyroid, but this could a lab error as well  OK to repeat TSH and add free T4 which would clear this up -- done per emr  OK to send lab report to pt

## 2012-07-08 NOTE — Telephone Encounter (Signed)
The patient would like a explanation of her recent lab results.  She was unable to get on mychart and would rather have results mailed to her in a letter

## 2012-07-09 ENCOUNTER — Other Ambulatory Visit (INDEPENDENT_AMBULATORY_CARE_PROVIDER_SITE_OTHER): Payer: BC Managed Care – PPO

## 2012-07-09 ENCOUNTER — Encounter (HOSPITAL_COMMUNITY): Payer: Self-pay | Admitting: Emergency Medicine

## 2012-07-09 ENCOUNTER — Emergency Department (HOSPITAL_COMMUNITY)
Admission: EM | Admit: 2012-07-09 | Discharge: 2012-07-10 | Disposition: A | Discharge status: Home / Self Care | Payer: BC Managed Care – PPO | Attending: Emergency Medicine | Admitting: Emergency Medicine

## 2012-07-09 DIAGNOSIS — R7989 Other specified abnormal findings of blood chemistry: Secondary | ICD-10-CM

## 2012-07-09 DIAGNOSIS — R1110 Vomiting, unspecified: Secondary | ICD-10-CM | POA: Insufficient documentation

## 2012-07-09 DIAGNOSIS — F410 Panic disorder [episodic paroxysmal anxiety] without agoraphobia: Secondary | ICD-10-CM | POA: Insufficient documentation

## 2012-07-09 DIAGNOSIS — I10 Essential (primary) hypertension: Secondary | ICD-10-CM | POA: Insufficient documentation

## 2012-07-09 DIAGNOSIS — K219 Gastro-esophageal reflux disease without esophagitis: Secondary | ICD-10-CM | POA: Insufficient documentation

## 2012-07-09 DIAGNOSIS — R0602 Shortness of breath: Secondary | ICD-10-CM | POA: Insufficient documentation

## 2012-07-09 DIAGNOSIS — R450 Nervousness: Secondary | ICD-10-CM | POA: Insufficient documentation

## 2012-07-09 DIAGNOSIS — Z79899 Other long term (current) drug therapy: Secondary | ICD-10-CM | POA: Insufficient documentation

## 2012-07-09 DIAGNOSIS — Z8739 Personal history of other diseases of the musculoskeletal system and connective tissue: Secondary | ICD-10-CM | POA: Insufficient documentation

## 2012-07-09 DIAGNOSIS — R6889 Other general symptoms and signs: Secondary | ICD-10-CM

## 2012-07-09 DIAGNOSIS — F17200 Nicotine dependence, unspecified, uncomplicated: Secondary | ICD-10-CM | POA: Insufficient documentation

## 2012-07-09 DIAGNOSIS — R079 Chest pain, unspecified: Secondary | ICD-10-CM | POA: Insufficient documentation

## 2012-07-09 LAB — TSH: TSH: 0.41 u[IU]/mL (ref 0.35–5.50)

## 2012-07-09 LAB — T4, FREE: Free T4: 0.89 ng/dL (ref 0.60–1.60)

## 2012-07-09 MED ORDER — ALPRAZOLAM 0.25 MG PO TABS: 0.2500 mg | ORAL_TABLET | Freq: Three times a day (TID) | ORAL | Status: DC | PRN

## 2012-07-09 NOTE — ED Provider Notes (Signed)
History  This chart was scribed for non-physician practitioner Junius Finner, PA-C working with Loren Racer, MD, by Candelaria Stagers, ED Scribe. This patient was seen in room TR09C/TR09C and the patient's care was started at 11:28 PM   CSN: [id number]  Arrival date & time 07/09/12  2031   First MD Initiated Contact with Patient 07/09/12 2251      Chief Complaint  Patient presents with  . Panic Attack     The history is provided by the patient. No language interpreter was used.   HPI Comments: [name] Walker is a 52 y.o. female who presents to the Emergency Department complaining of sudden onset of a panic attack that occurred about three hours ago lasting about an hour which included intermittent anxiety, chest pain, vomiting x1, and trouble breathing.  Pt attributes the anxiety to her father recently passing.  Pt reports sx have now subsided.  Denies chest pain, SOB, fever, cough, or abdominal pain at this time.  Denies cardiac hx or hx of asthma.  Past Medical History  Diagnosis Date  . ACHILLES TENDINITIS   . ALLERGIC RHINITIS   . GERD   . HYPERTENSION   . LATERAL EPICONDYLITIS, RIGHT   . LOW BACK PAIN   . Plantar fascial fibromatosis   . SUBACROMIAL BURSITIS, RIGHT     Past Surgical History  Procedure Laterality Date  . Tubal ligation      Family History  Problem Relation Age of Onset  . Cancer Sister     colon cancer  . Arthritis Other   . Diabetes Other   . Hypertension Other   . Cancer Other     breast cancer and prostate cancer  . Stroke Other     History  Substance Use Topics  . Smoking status: Current Every Day Smoker -- 1.00 packs/day    Types: Cigarettes  . Smokeless tobacco: Not on file  . Alcohol Use: No    OB History   Grav Para Term Preterm Abortions TAB SAB Ect Mult Living                  Review of Systems  Respiratory: Positive for shortness of breath.   Cardiovascular: Positive for chest pain.  Gastrointestinal: Positive for  vomiting.  Psychiatric/Behavioral: The patient is nervous/anxious.   All other systems reviewed and are negative.    Allergies  Other and Tramadol  Home Medications   Current Outpatient Rx  Name  Route  Sig  Dispense  Refill  . ibuprofen (ADVIL,MOTRIN) 600 MG tablet   Oral   Take 600 mg by mouth every 6 (six) hours as needed for pain.         Marland Kitchen lisinopril (PRINIVIL,ZESTRIL) 20 MG tablet      TAKE ONE TABLET BY MOUTH EVERY DAY   90 tablet   2   . pantoprazole (PROTONIX) 40 MG tablet   Oral   Take 1 tablet (40 mg total) by mouth daily.   90 tablet   3   . ALPRAZolam (XANAX) 0.25 MG tablet   Oral   Take 1 tablet (0.25 mg total) by mouth 3 (three) times daily as needed for sleep or anxiety (Take up to 3 times daily as needed for panic attacks).   10 tablet   0     BP 166/108  Pulse 89  Temp(Src) 98.6 F (37 C) (Oral)  Resp 16  SpO2 98%  LMP 08/22/2010  Physical Exam  Nursing note and vitals reviewed. Constitutional:  She is oriented to person, place, and time. She appears well-developed and well-nourished. No distress.  Pt sitting up in exam bed. NAD.   HENT:  Head: Normocephalic and atraumatic.  Eyes: Conjunctivae and EOM are normal.  Neck: Normal range of motion. Neck supple. No tracheal deviation present.  Cardiovascular: Normal rate and regular rhythm.   Pulmonary/Chest: Effort normal and breath sounds normal. No respiratory distress. She has no wheezes. She has no rales. She exhibits no tenderness.  Abdominal: Soft. Bowel sounds are normal. She exhibits no distension. There is no tenderness.  Musculoskeletal: Normal range of motion.  Neurological: She is alert and oriented to person, place, and time.  Skin: Skin is warm and dry.  Psychiatric: She has a normal mood and affect. Her behavior is normal.    ED Course  Procedures   DIAGNOSTIC STUDIES: Oxygen Saturation is 98% on room air, normal by my interpretation.    COORDINATION OF CARE:  11:30 PM  Discussed course of care with pt which includes xanax and follow up with PCP.  Pt understands and agrees.    Labs Reviewed - No data to display No results found.   1. Panic attack       MDM  Pt presented after panic attack that started earlier this evening believed to be triggered by death of pt's father.  Pt states symptoms have resolved completely.  Denies previous hx of panic attacks. Denies cardiac hx.  PE: nl.  Not concerned for ACS at this time.  Rx: Xanax 10 prn for panic attacks.  Advised pt importance of f/u with PCP Dr. Jonny Ruiz for continued management of anxiety attacks and management of HTN. Pt informed BP was elevated during ED visit.    I personally performed the services described in this documentation, which was scribed in my presence. The recorded information has been reviewed and is accurate.         Junius Finner, PA-C 07/09/12 2345

## 2012-07-09 NOTE — ED Notes (Signed)
Per family, pt had anxiety and then had chest pain radiating to neck with SOB lasting apprx 5 sec. Pt. Denies complaints currently. States dad passed away and has been under a lot of stress.

## 2012-07-09 NOTE — ED Notes (Signed)
PT. PRESENTS WITH DAUGHTER REPORTS DEATH OF PT.'S FATHER THIS EVENING , PRESENTS SHAKING WITH ANXIETY ATTACK " NERVOUS" , TEARFUL AT TRIAGE .

## 2012-07-09 NOTE — Telephone Encounter (Signed)
Patient informed and mailed labs to home

## 2012-07-11 ENCOUNTER — Telehealth: Payer: Self-pay | Admitting: Internal Medicine

## 2012-07-11 NOTE — ED Provider Notes (Signed)
Medical screening examination/treatment/procedure(s) were performed by non-physician practitioner and as supervising physician I was immediately available for consultation/collaboration.   Adarian Bur, MD 07/11/12 0456 

## 2012-07-11 NOTE — Telephone Encounter (Signed)
Patient Information:  Caller Name: Earnest  Phone: [phone]  Patient: [name] [name]  Gender: Female  DOB: [birth year]  Age: 52 Years  PCP: Oliver Barre (Adults only)  Pregnant: No  Office Follow Up:  Does the office need to follow up with this patient?: No  Instructions For The Office: N/A  RN Note:  Erasmo Score is Sat and pt will call back for appt. On Monday 5/19.  Symptoms  Reason For Call & Symptoms: Requesting lab results. Relayed results and notes in EMR. Left leg/foot pain that shoots up from foot to hip with ambulation.  Pt explains this does occur wih both LE but today 5/15 the LLE is affected.   Mild foot /ankle swelling with tingling sensation.  Symptoms present 3 + weeks.  Pt seen in ED Aug 02, 2022 for a panic attack.  Pt's father passed away 08-02-2022 and given Xanax to help pt through the weekend.   Reviewed Health History In EMR: Yes  Reviewed Medications In EMR: Yes  Reviewed Allergies In EMR: Yes  Reviewed Surgeries / Procedures: Yes  Date of Onset of Symptoms: 06/20/2012  Treatments Tried: Ibuprofen 600mg   Treatments Tried Worked: No OB / GYN:  LMP: Unknown  Guideline(s) Used:  Leg Pain  Foot Pain  Disposition Per Guideline:   See Today  Reason For Disposition Reached:   Moderate pain (e.g., interferes with normal activities, limping) and present > 3 days Foot Swollen  Advice Given:  Pain Medicines:  Ibuprofen (e.g., Motrin, Advil):  Take 400 mg (two 200 mg pills) by mouth every 6 hours.  Another choice is to take 600 mg (three 200 mg pills) by mouth every 8 hours.  The most you should take each day is 1,200 mg (six 200 mg pills), unless your doctor has told you to take more.  Call Back If:  Swelling, redness, or fever occur  Severe pain not relieved by pain medication  Pain lasts over 7 days  You become worse.  Patient Refused Recommendation:  Patient Will Follow Up With Office Later  RN note

## 2012-07-16 ENCOUNTER — Other Ambulatory Visit: Payer: Self-pay

## 2012-07-16 MED ORDER — ALPRAZOLAM 0.25 MG PO TABS: 0.2500 mg | ORAL_TABLET | Freq: Two times a day (BID) | ORAL | Status: DC | PRN

## 2012-07-16 NOTE — Telephone Encounter (Signed)
Done hardcopy to robin  

## 2012-07-16 NOTE — Telephone Encounter (Signed)
Faxed hardcopy to [facility] 

## 2012-07-17 ENCOUNTER — Encounter: Payer: Self-pay | Admitting: Internal Medicine

## 2012-07-17 ENCOUNTER — Ambulatory Visit (INDEPENDENT_AMBULATORY_CARE_PROVIDER_SITE_OTHER): Payer: BC Managed Care – PPO | Admitting: Internal Medicine

## 2012-07-17 VITALS — BP 148/98 | HR 84 | Temp 97.9°F | Ht 60.0 in | Wt 143.2 lb

## 2012-07-17 DIAGNOSIS — F4321 Adjustment disorder with depressed mood: Secondary | ICD-10-CM | POA: Insufficient documentation

## 2012-07-17 DIAGNOSIS — I10 Essential (primary) hypertension: Secondary | ICD-10-CM

## 2012-07-17 DIAGNOSIS — F17200 Nicotine dependence, unspecified, uncomplicated: Secondary | ICD-10-CM | POA: Insufficient documentation

## 2012-07-17 DIAGNOSIS — IMO0002 Reserved for concepts with insufficient information to code with codable children: Secondary | ICD-10-CM

## 2012-07-17 DIAGNOSIS — M5416 Radiculopathy, lumbar region: Secondary | ICD-10-CM | POA: Insufficient documentation

## 2012-07-17 MED ORDER — PREDNISONE 10 MG PO TABS: ORAL_TABLET | ORAL | Status: DC

## 2012-07-17 NOTE — Assessment & Plan Note (Signed)
Mild without neuro change, but no more than 1 mo, for MRI LS spine, predpack, considere ortho referral,  to f/u any worsening symptoms or concerns, gave work note

## 2012-07-17 NOTE — Patient Instructions (Signed)
Please take all new medication as prescribed - the prednisone You will be contacted regarding the referral for: MRI for the lower back You are given the work note today Please continue all other medications as before, including the xanax as needed Please call if you change your mind about trying Lexapro for low mood Please continue to monitor you Blood Pressure at home, as your goal is < 140/90 I think you can try the E-cigs, and possibly later we could consider the chantix when you are feeling better

## 2012-07-17 NOTE — Assessment & Plan Note (Signed)
Counseled to quit, plans to try e-cigs, I would hold off for now on chantix given her grief reaction, to consider in the future

## 2012-07-17 NOTE — Assessment & Plan Note (Signed)
Ok for xanax prn, delcines lexapro or counseling

## 2012-07-17 NOTE — Assessment & Plan Note (Signed)
elev today but overall stable by history and exam, recent data reviewed with pt, and pt to continue medical treatment as before,  to f/u any worsening symptoms or concerns, pt declines change in med today  BP Readings from Last 3 Encounters:  07/17/12 148/98  07/10/12 155/99  07/02/12 122/88

## 2012-07-17 NOTE — Progress Notes (Signed)
Subjective:    Patient ID: [name] Walker, female    DOB: 1960/08/26, 52 y.o.   MRN: [id number]  HPI  Here to f/u, father died 8 days ago, she was in the room, and had funeral a few days later, had cancer and was not unexpected but she had dizziness, almost fainted and had to be taken to ER per EMS, tx and released, no other specific illness, pt states had panic attack and still nervous since, almost had another at the grocery yesterday when saw the father day cards. She states  And grieving, ? Depression.  Gets nervous driving. Pt continues to have recurring 1 mo left LBP without change in severity,no bowel or bladder change, fever, wt loss, but has worsening LE pain/numbness/weakness, but no gait change or falls. Left handed. Ibuprofen not helping. Past Medical History  Diagnosis Date  . ACHILLES TENDINITIS   . ALLERGIC RHINITIS   . GERD   . HYPERTENSION   . LATERAL EPICONDYLITIS, RIGHT   . LOW BACK PAIN   . Plantar fascial fibromatosis   . SUBACROMIAL BURSITIS, RIGHT    Past Surgical History  Procedure Laterality Date  . Tubal ligation      reports that she has been smoking Cigarettes.  She has been smoking about 1.00 pack per day. She does not have any smokeless tobacco history on file. She reports that she does not drink alcohol. Her drug history is not on file. family history includes Arthritis in her other; Cancer in her other and sister; Diabetes in her other; Hypertension in her other; and Stroke in her other. Allergies  Allergen Reactions  . Other Palpitations    ALL MUSCLE RELAXERS-PER PATIENT  . Tramadol Other (See Comments)    Hyperactivity, can't sleep   Current Outpatient Prescriptions on File Prior to Visit  Medication Sig Dispense Refill  . ALPRAZolam (XANAX) 0.25 MG tablet Take 1 tablet (0.25 mg total) by mouth 2 (two) times daily as needed for anxiety (Take up to 3 times daily as needed for panic attacks).  60 tablet  2  . ibuprofen (ADVIL,MOTRIN) 600 MG tablet  Take 600 mg by mouth every 6 (six) hours as needed for pain.      Marland Kitchen lisinopril (PRINIVIL,ZESTRIL) 20 MG tablet TAKE ONE TABLET BY MOUTH EVERY DAY  90 tablet  2  . pantoprazole (PROTONIX) 40 MG tablet Take 1 tablet (40 mg total) by mouth daily.  90 tablet  3   No current facility-administered medications on file prior to visit.   Review of Systems  Constitutional: Negative for unexpected weight change, or unusual diaphoresis  HENT: Negative for tinnitus.   Eyes: Negative for photophobia and visual disturbance.  Respiratory: Negative for choking and stridor.   Gastrointestinal: Negative for vomiting and blood in stool.  Genitourinary: Negative for hematuria and decreased urine volume.  Musculoskeletal: Negative for acute joint swelling Skin: Negative for color change and wound.  Neurological: Negative for tremors and numbness other than noted  Psychiatric/Behavioral: Negative for decreased concentration or  hyperactivity.       Objective:   Physical Exam BP 148/98  Pulse 84  Temp(Src) 97.9 F (36.6 C) (Oral)  Ht 5' (1.524 m)  Wt 143 lb 4 oz (64.978 kg)  BMI 27.98 kg/m2  SpO2 98%  LMP 08/22/2010 N/A VS noted,  Constitutional: Pt appears well-developed and well-nourished.  HENT: Head: NCAT.  Right Ear: External ear normal.  Left Ear: External ear normal.  Eyes: Conjunctivae and EOM are normal. Pupils  are equal, round, and reactive to light.  Neck: Normal range of motion. Neck supple.  Cardiovascular: Normal rate and regular rhythm.   Pulmonary/Chest: Effort normal and breath sounds normal.  Abd:  Soft, NT, non-distended, + BS Neurological: Pt is alert. Not confused , motor/gait/sens/dtr intact Skin: Skin is warm. No erythema. no rash Psychiatric: Pt behavior is normal. Thought content normal.1-2+ nervous , mild depressed affect    Assessment & Plan:

## 2012-07-25 ENCOUNTER — Ambulatory Visit
Admission: RE | Admit: 2012-07-25 | Discharge: 2012-07-25 | Disposition: A | Discharge status: Home / Self Care | Payer: BC Managed Care – PPO | Source: Ambulatory Visit | Attending: Internal Medicine | Admitting: Internal Medicine

## 2012-07-25 DIAGNOSIS — M5416 Radiculopathy, lumbar region: Secondary | ICD-10-CM

## 2012-07-26 ENCOUNTER — Other Ambulatory Visit: Payer: Self-pay | Admitting: Internal Medicine

## 2012-07-26 DIAGNOSIS — M5416 Radiculopathy, lumbar region: Secondary | ICD-10-CM

## 2012-08-23 ENCOUNTER — Other Ambulatory Visit: Payer: Self-pay | Admitting: Internal Medicine

## 2012-08-26 NOTE — Telephone Encounter (Signed)
Done erx 

## 2012-09-03 ENCOUNTER — Other Ambulatory Visit: Payer: Self-pay | Admitting: Internal Medicine

## 2012-09-09 ENCOUNTER — Telehealth: Payer: Self-pay | Admitting: Internal Medicine

## 2012-09-09 MED ORDER — TRAMADOL HCL 50 MG PO TABS: 50.0000 mg | ORAL_TABLET | Freq: Four times a day (QID) | ORAL | Status: DC | PRN

## 2012-09-09 NOTE — Telephone Encounter (Signed)
Pt still has pain in lower back.  The Cortisone shot didn't work.  Ibuprofen isn't helping much.  Is there another pain medicine she can try?

## 2012-09-09 NOTE — Telephone Encounter (Signed)
Ok to try tramadol prn - done erx 

## 2012-09-10 NOTE — Telephone Encounter (Signed)
Patient informed. 

## 2012-10-07 ENCOUNTER — Other Ambulatory Visit: Payer: Self-pay | Admitting: Internal Medicine

## 2012-10-31 ENCOUNTER — Other Ambulatory Visit: Payer: Self-pay | Admitting: Internal Medicine

## 2013-01-02 ENCOUNTER — Other Ambulatory Visit: Payer: Self-pay

## 2013-02-16 ENCOUNTER — Other Ambulatory Visit: Payer: Self-pay | Admitting: Internal Medicine

## 2013-02-27 HISTORY — PX: BACK SURGERY: SHX140

## 2013-04-11 ENCOUNTER — Ambulatory Visit (INDEPENDENT_AMBULATORY_CARE_PROVIDER_SITE_OTHER): Payer: BC Managed Care – PPO | Admitting: Internal Medicine

## 2013-04-11 ENCOUNTER — Encounter: Payer: Self-pay | Admitting: Internal Medicine

## 2013-04-11 VITALS — BP 120/80 | HR 75 | Temp 97.7°F | Ht 62.0 in | Wt 159.0 lb

## 2013-04-11 DIAGNOSIS — F4321 Adjustment disorder with depressed mood: Secondary | ICD-10-CM

## 2013-04-11 DIAGNOSIS — I10 Essential (primary) hypertension: Secondary | ICD-10-CM

## 2013-04-11 DIAGNOSIS — J209 Acute bronchitis, unspecified: Secondary | ICD-10-CM

## 2013-04-11 MED ORDER — PANTOPRAZOLE SODIUM 40 MG PO TBEC: 40.0000 mg | DELAYED_RELEASE_TABLET | Freq: Every day | ORAL | Status: DC

## 2013-04-11 MED ORDER — HYDROCODONE-HOMATROPINE 5-1.5 MG/5ML PO SYRP: 5.0000 mL | ORAL_SOLUTION | Freq: Four times a day (QID) | ORAL | Status: DC | PRN

## 2013-04-11 MED ORDER — AZITHROMYCIN 250 MG PO TABS: ORAL_TABLET | ORAL | Status: DC

## 2013-04-11 NOTE — Assessment & Plan Note (Signed)
Improved, declines need for further tx at this time

## 2013-04-11 NOTE — Assessment & Plan Note (Signed)
Mild to mod, for antibx course,  to f/u any worsening symptoms or concerns 

## 2013-04-11 NOTE — Progress Notes (Signed)
Pre-visit discussion using our clinic review tool. No additional management support is needed unless otherwise documented below in the visit note.  

## 2013-04-11 NOTE — Assessment & Plan Note (Signed)
stable overall by history and exam, recent data reviewed with pt, and pt to continue medical treatment as before,  to f/u any worsening symptoms or concerns BP Readings from Last 3 Encounters:  04/11/13 120/80  07/17/12 148/98  07/10/12 155/99

## 2013-04-11 NOTE — Progress Notes (Signed)
Subjective:    Patient ID: [name] Walker, female    DOB: 22-Oct-1960, 53 y.o.   MRN: [id number]  HPI  Here with acute onset mild to mod 2-3 days ST, HA, general weakness and malaise, with prod cough greenish sputum, but Pt denies chest pain, increased sob or doe, wheezing, orthopnea, PND, increased LE swelling, palpitations, dizziness or syncope.. Also with cough keeping her up at night.  Pt denies new neurological symptoms such as new headache, or facial or extremity weakness or numbness   Pt denies polydipsia, polyuria. Denies worsening depressive symptoms, suicidal ideation, or panic; has ongoing anxiety, not increased recently.  Past Medical History  Diagnosis Date  . ACHILLES TENDINITIS   . ALLERGIC RHINITIS   . GERD   . HYPERTENSION   . LATERAL EPICONDYLITIS, RIGHT   . LOW BACK PAIN   . Plantar fascial fibromatosis   . SUBACROMIAL BURSITIS, RIGHT    Past Surgical History  Procedure Laterality Date  . Tubal ligation      reports that she has been smoking Cigarettes.  She has been smoking about 1.00 pack per day. She does not have any smokeless tobacco history on file. She reports that she does not drink alcohol. Her drug history is not on file. family history includes Arthritis in her other; Cancer in her other and sister; Diabetes in her other; Hypertension in her other; Stroke in her other. Allergies  Allergen Reactions  . Other Palpitations    ALL MUSCLE RELAXERS-PER PATIENT  . Tramadol Other (See Comments)    Hyperactivity, can't sleep   Current Outpatient Prescriptions on File Prior to Visit  Medication Sig Dispense Refill  . ALPRAZolam (XANAX) 0.25 MG tablet Take 1 tablet (0.25 mg total) by mouth 2 (two) times daily as needed for anxiety (Take up to 3 times daily as needed for panic attacks).  60 tablet  2  . ibuprofen (ADVIL,MOTRIN) 600 MG tablet Take 600 mg by mouth every 6 (six) hours as needed for pain.      Marland Kitchen ibuprofen (ADVIL,MOTRIN) 800 MG tablet TAKE ONE TABLET  BY MOUTH EVERY 8 HOURS AS NEEDED FOR PAIN  60 tablet  6  . lactulose (CHRONULAC) 10 GM/15ML solution TAKE 30-60 CC BY MOUTH TWICE DAILY AS NEEDED FOR CONSTIPATION  500 mL  1  . lisinopril (PRINIVIL,ZESTRIL) 20 MG tablet TAKE ONE TABLET BY MOUTH EVERY DAY  90 tablet  0  . traMADol (ULTRAM) 50 MG tablet Take 1 tablet (50 mg total) by mouth every 6 (six) hours as needed for pain.  60 tablet  1   No current facility-administered medications on file prior to visit.   Review of Systems  Constitutional: Negative for unexpected weight change, or unusual diaphoresis  HENT: Negative for tinnitus.   Eyes: Negative for photophobia and visual disturbance.  Respiratory: Negative for choking and stridor.   Gastrointestinal: Negative for vomiting and blood in stool.  Genitourinary: Negative for hematuria and decreased urine volume.  Musculoskeletal: Negative for acute joint swelling Skin: Negative for color change and wound.  Neurological: Negative for tremors and numbness other than noted  Psychiatric/Behavioral: Negative for decreased concentration or  hyperactivity.       Objective:   Physical Exam BP 120/80  Pulse 75  Temp(Src) 97.7 F (36.5 C) (Oral)  Ht 5\' 2"  (1.575 m)  Wt 159 lb (72.122 kg)  BMI 29.07 kg/m2  SpO2 97%  LMP 08/22/2010 VS noted, mild ill Constitutional: Pt appears well-developed and well-nourished.  HENT: Head:  NCAT.  Right Ear: External ear normal.  Left Ear: External ear normal.  Eyes: Conjunctivae and EOM are normal. Pupils are equal, round, and reactive to light.  Bilat tm's with mild erythema.  Max sinus areas mild tender.  Pharynx with mild erythema, no exudate Neck: Normal range of motion. Neck supple.  Cardiovascular: Normal rate and regular rhythm.   Pulmonary/Chest: Effort normal and breath sounds normal.  Neurological: Pt is alert. Not confused  Skin: Skin is warm. No erythema.  Psychiatric: Pt behavior is normal. Thought content normal. not depressed  affect     Assessment & Plan:

## 2013-04-11 NOTE — Patient Instructions (Addendum)
Please take all new medication as prescribed - the antibiotic, and the cough medicine  Please continue all other medications as before, and refills have been done if requested.  Please have the pharmacy call with any other refills you may need.  Please return about May 2015, or sooner if needed (the office will call)

## 2013-05-26 ENCOUNTER — Other Ambulatory Visit: Payer: Self-pay | Admitting: Internal Medicine

## 2013-06-27 ENCOUNTER — Other Ambulatory Visit: Payer: Self-pay | Admitting: Internal Medicine

## 2013-07-25 ENCOUNTER — Other Ambulatory Visit: Payer: Self-pay | Admitting: Internal Medicine

## 2013-08-26 ENCOUNTER — Other Ambulatory Visit: Payer: Self-pay | Admitting: Internal Medicine

## 2013-10-01 ENCOUNTER — Other Ambulatory Visit (INDEPENDENT_AMBULATORY_CARE_PROVIDER_SITE_OTHER): Payer: BC Managed Care – PPO

## 2013-10-01 ENCOUNTER — Ambulatory Visit (INDEPENDENT_AMBULATORY_CARE_PROVIDER_SITE_OTHER): Payer: BC Managed Care – PPO | Admitting: Internal Medicine

## 2013-10-01 ENCOUNTER — Encounter: Payer: Self-pay | Admitting: Internal Medicine

## 2013-10-01 VITALS — BP 133/94 | HR 84 | Temp 98.5°F | Ht 62.0 in | Wt 158.5 lb

## 2013-10-01 DIAGNOSIS — R232 Flushing: Secondary | ICD-10-CM

## 2013-10-01 DIAGNOSIS — I10 Essential (primary) hypertension: Secondary | ICD-10-CM

## 2013-10-01 DIAGNOSIS — N951 Menopausal and female climacteric states: Secondary | ICD-10-CM

## 2013-10-01 DIAGNOSIS — M545 Low back pain, unspecified: Secondary | ICD-10-CM

## 2013-10-01 DIAGNOSIS — Z0000 Encounter for general adult medical examination without abnormal findings: Secondary | ICD-10-CM

## 2013-10-01 LAB — LIPID PANEL
Cholesterol: 171 mg/dL (ref 0–200)
HDL: 31.4 mg/dL — ABNORMAL LOW (ref >39.00)
NonHDL: 139.6
Total CHOL/HDL Ratio: 5
Triglycerides: 418 mg/dL — ABNORMAL HIGH (ref 0.0–149.0)
VLDL: 83.6 mg/dL — ABNORMAL HIGH (ref 0.0–40.0)

## 2013-10-01 LAB — CBC WITH DIFFERENTIAL/PLATELET
Basophils Absolute: 0 10*3/uL (ref 0.0–0.1)
Basophils Relative: 0.5 % (ref 0.0–3.0)
Eosinophils Absolute: 0.1 10*3/uL (ref 0.0–0.7)
Eosinophils Relative: 2 % (ref 0.0–5.0)
HCT: 43.2 % (ref 36.0–46.0)
Hemoglobin: 14.2 g/dL (ref 12.0–15.0)
Lymphocytes Relative: 43 % (ref 12.0–46.0)
Lymphs Abs: 2.6 10*3/uL (ref 0.7–4.0)
MCHC: 32.8 g/dL (ref 30.0–36.0)
MCV: 86.9 fl (ref 78.0–100.0)
Monocytes Absolute: 0.5 10*3/uL (ref 0.1–1.0)
Monocytes Relative: 8.6 % (ref 3.0–12.0)
Neutro Abs: 2.7 10*3/uL (ref 1.4–7.7)
Neutrophils Relative %: 45.9 % (ref 43.0–77.0)
Platelets: 129 10*3/uL — ABNORMAL LOW (ref 150.0–400.0)
RBC: 4.97 Mil/uL (ref 3.87–5.11)
RDW: 14.7 % (ref 11.5–15.5)
WBC: 5.9 10*3/uL (ref 4.0–10.5)

## 2013-10-01 LAB — URINALYSIS, ROUTINE W REFLEX MICROSCOPIC
Hgb urine dipstick: NEGATIVE
Leukocytes, UA: NEGATIVE
Nitrite: NEGATIVE
Specific Gravity, Urine: 1.025 (ref 1.000–1.030)
Total Protein, Urine: NEGATIVE
Urine Glucose: NEGATIVE
Urobilinogen, UA: 1 (ref 0.0–1.0)
pH: 5.5 (ref 5.0–8.0)

## 2013-10-01 LAB — BASIC METABOLIC PANEL
BUN: 13 mg/dL (ref 6–23)
CO2: 24 mEq/L (ref 19–32)
Calcium: 9.1 mg/dL (ref 8.4–10.5)
Chloride: 109 mEq/L (ref 96–112)
Creatinine, Ser: 0.7 mg/dL (ref 0.4–1.2)
GFR: 106.94 mL/min (ref >60.00)
Glucose, Bld: 84 mg/dL (ref 70–99)
Potassium: 3.2 mEq/L — ABNORMAL LOW (ref 3.5–5.1)
Sodium: 140 mEq/L (ref 135–145)

## 2013-10-01 LAB — HEPATIC FUNCTION PANEL
ALT: 24 U/L (ref 0–35)
AST: 26 U/L (ref 0–37)
Albumin: 4 g/dL (ref 3.5–5.2)
Alkaline Phosphatase: 123 U/L — ABNORMAL HIGH (ref 39–117)
Bilirubin, Direct: 0 mg/dL (ref 0.0–0.3)
Total Bilirubin: 0.4 mg/dL (ref 0.2–1.2)
Total Protein: 7 g/dL (ref 6.0–8.3)

## 2013-10-01 LAB — TSH: TSH: 0.38 u[IU]/mL (ref 0.35–4.50)

## 2013-10-01 LAB — FOLLICLE STIMULATING HORMONE: FSH: 60.6 m[IU]/mL

## 2013-10-01 LAB — LDL CHOLESTEROL, DIRECT: Direct LDL: 104.3 mg/dL

## 2013-10-01 MED ORDER — MELOXICAM 15 MG PO TABS: 15.0000 mg | ORAL_TABLET | Freq: Every day | ORAL | Status: DC

## 2013-10-01 MED ORDER — SERTRALINE HCL 50 MG PO TABS: 50.0000 mg | ORAL_TABLET | Freq: Every day | ORAL | Status: DC

## 2013-10-01 MED ORDER — IBUPROFEN 800 MG PO TABS: 800.0000 mg | ORAL_TABLET | Freq: Three times a day (TID) | ORAL | Status: DC | PRN

## 2013-10-01 NOTE — Assessment & Plan Note (Signed)
Borderline elev today, for f/u BP at home and next visit

## 2013-10-01 NOTE — Assessment & Plan Note (Signed)

## 2013-10-01 NOTE — Assessment & Plan Note (Signed)
For zoloft 50 qd, check fsh

## 2013-10-01 NOTE — Patient Instructions (Addendum)
Please take all new medication as prescribed - the zoloft 50 mg for hot flashes, and the generic Mobic for lower back pain  Please continue to monitor your Blood Pressure on a regular basis, and the goal is to be less than 140/90  Please continue all other medications as before, and refills have been done if requested.  Please have the pharmacy call with any other refills you may need.  Please continue your efforts at being more active, low cholesterol diet, and weight control.  You are otherwise up to date with prevention measures today.  Please keep your appointments with your specialists as you may have planned  You will be contacted regarding the referral for: mammogram  Please go to the LAB in the Basement (turn left off the elevator) for the tests to be done today  You will be contacted by phone if any changes need to be made immediately.  Otherwise, you will receive a letter about your results with an explanation, but please check with MyChart first.  Please remember to sign up for MyChart if you have not done so, as this will be important to you in the future with finding out test results, communicating by private email, and scheduling acute appointments online when needed.  Please return in 6 months, or sooner if needed

## 2013-10-01 NOTE — Assessment & Plan Note (Signed)
Ok to d/c motrin, for mobic 15 qd prn

## 2013-10-01 NOTE — Progress Notes (Signed)
Pre visit review using our clinic review tool, if applicable. No additional management support is needed unless otherwise documented below in the visit note. 

## 2013-10-01 NOTE — Progress Notes (Signed)
Subjective:    Patient ID: [name] Walker, female    DOB: [birth year], 53 y.o.   MRN: [id number]  HPI  Here for wellness and f/u;  Overall doing ok;  Pt denies CP, worsening SOB, DOE, wheezing, orthopnea, PND, worsening LE edema, palpitations, dizziness or syncope.  Pt denies neurological change such as new headache, facial or extremity weakness.  Pt denies polydipsia, polyuria, or low sugar symptoms. Pt states overall good compliance with treatment and medications, good tolerability, and has been trying to follow lower cholesterol diet.  Pt denies worsening depressive symptoms, suicidal ideation or panic. No fever, night sweats, wt loss, loss of appetite, or other constitutional symptoms.  Pt states good ability with ADL's, has low fall risk, home safety reviewed and adequate, no other significant changes in hearing or vision, and only occasionally active with exercise.  Pt continues to have recurring LBP without change in severity, bowel or bladder change, fever, wt loss,  worsening LE pain/numbness/weakness, gait change or falls, s/p lumbar surgury 2014, still takes occas ibuprofen.  Wondering about menopause with increased hot flashes.  BP at home < 140/90 usually though hasnt taken recently Past Medical History  Diagnosis Date  . ACHILLES TENDINITIS   . ALLERGIC RHINITIS   . GERD   . HYPERTENSION   . LATERAL EPICONDYLITIS, RIGHT   . LOW BACK PAIN   . Plantar fascial fibromatosis   . SUBACROMIAL BURSITIS, RIGHT    Past Surgical History  Procedure Laterality Date  . Tubal ligation      reports that she has been smoking Cigarettes.  She has been smoking about 1.00 pack per day. She does not have any smokeless tobacco history on file. She reports that she does not drink alcohol. Her drug history is not on file. family history includes Arthritis in her other; Cancer in her other and sister; Diabetes in her other; Hypertension in her other; Stroke in her other. Allergies  Allergen  Reactions  . Other Palpitations    ALL MUSCLE RELAXERS-PER PATIENT  . Tramadol Other (See Comments)    Hyperactivity, can't sleep   Current Outpatient Prescriptions on File Prior to Visit  Medication Sig Dispense Refill  . ALPRAZolam (XANAX) 0.25 MG tablet Take 1 tablet (0.25 mg total) by mouth 2 (two) times daily as needed for anxiety (Take up to 3 times daily as needed for panic attacks).  60 tablet  2  . lactulose (CHRONULAC) 10 GM/15ML solution TAKE 30-60 CC BY MOUTH TWICE DAILY AS NEEDED FOR CONSTIPATION  500 mL  1  . lisinopril (PRINIVIL,ZESTRIL) 20 MG tablet TAKE ONE TABLET BY MOUTH ONCE DAILY  90 tablet  0  . pantoprazole (PROTONIX) 40 MG tablet Take 1 tablet (40 mg total) by mouth daily.  90 tablet  3  . traMADol (ULTRAM) 50 MG tablet Take 1 tablet (50 mg total) by mouth every 6 (six) hours as needed for pain.  60 tablet  1   No current facility-administered medications on file prior to visit.    Review of Systems Constitutional: Negative for increased diaphoresis, other activity, appetite or other siginficant weight change  HENT: Negative for worsening hearing loss, ear pain, facial swelling, mouth sores and neck stiffness.   Eyes: Negative for other worsening pain, redness or visual disturbance.  Respiratory: Negative for shortness of breath and wheezing.   Cardiovascular: Negative for chest pain and palpitations.  Gastrointestinal: Negative for diarrhea, blood in stool, abdominal distention or other pain Genitourinary: Negative for hematuria, flank pain  or change in urine volume.  Musculoskeletal: Negative for myalgias or other joint complaints.  Skin: Negative for color change and wound.  Neurological: Negative for syncope and numbness. other than noted Hematological: Negative for adenopathy. or other swelling Psychiatric/Behavioral: Negative for hallucinations, self-injury, decreased concentration or other worsening agitation.      Objective:   Physical Exam BP 133/94   Pulse 84  Temp(Src) 98.5 F (36.9 C) (Oral)  Ht 5\' 2"  (1.575 m)  Wt 158 lb 8 oz (71.895 kg)  BMI 28.98 kg/m2  SpO2 98%  LMP 08/22/2010 VS noted,  Constitutional: Pt is oriented to person, place, and time. Appears well-developed and well-nourished.  Head: Normocephalic and atraumatic.  Right Ear: External ear normal.  Left Ear: External ear normal.  Nose: Nose normal.  Mouth/Throat: Oropharynx is clear and moist.  Eyes: Conjunctivae and EOM are normal. Pupils are equal, round, and reactive to light.  Neck: Normal range of motion. Neck supple. No JVD present. No tracheal deviation present.  Cardiovascular: Normal rate, regular rhythm, normal heart sounds and intact distal pulses.   Pulmonary/Chest: Effort normal and breath sounds without rales or wheezing  Abdominal: Soft. Bowel sounds are normal. NT. No HSM  Musculoskeletal: Normal range of motion. Exhibits no edema.  Lymphadenopathy:  Has no cervical adenopathy.  Neurological: Pt is alert and oriented to person, place, and time. Pt has normal reflexes. No cranial nerve deficit. Motor grossly intact Skin: Skin is warm and dry. No rash noted.  Psychiatric:  Has normal mood and affect. Behavior is normal.     Assessment & Plan:   BP Readings from Last 3 Encounters:  10/01/13 133/94  04/11/13 120/80  07/17/12 148/98

## 2013-10-06 ENCOUNTER — Other Ambulatory Visit: Payer: Self-pay | Admitting: Internal Medicine

## 2013-10-06 DIAGNOSIS — Z1231 Encounter for screening mammogram for malignant neoplasm of breast: Secondary | ICD-10-CM

## 2013-10-07 ENCOUNTER — Ambulatory Visit
Admission: RE | Admit: 2013-10-07 | Discharge: 2013-10-07 | Disposition: A | Discharge status: Home / Self Care | Payer: BC Managed Care – PPO | Source: Ambulatory Visit | Attending: Internal Medicine | Admitting: Internal Medicine

## 2013-10-07 DIAGNOSIS — Z1231 Encounter for screening mammogram for malignant neoplasm of breast: Secondary | ICD-10-CM

## 2013-10-15 ENCOUNTER — Emergency Department (HOSPITAL_COMMUNITY)
Admission: EM | Admit: 2013-10-15 | Discharge: 2013-10-15 | Disposition: A | Discharge status: Home / Self Care | Payer: BC Managed Care – PPO | Attending: Emergency Medicine | Admitting: Emergency Medicine

## 2013-10-15 ENCOUNTER — Encounter (HOSPITAL_COMMUNITY): Payer: Self-pay | Admitting: Emergency Medicine

## 2013-10-15 ENCOUNTER — Telehealth: Payer: Self-pay | Admitting: Internal Medicine

## 2013-10-15 DIAGNOSIS — F17200 Nicotine dependence, unspecified, uncomplicated: Secondary | ICD-10-CM | POA: Diagnosis not present

## 2013-10-15 DIAGNOSIS — Z79899 Other long term (current) drug therapy: Secondary | ICD-10-CM | POA: Insufficient documentation

## 2013-10-15 DIAGNOSIS — T63461A Toxic effect of venom of wasps, accidental (unintentional), initial encounter: Secondary | ICD-10-CM | POA: Diagnosis not present

## 2013-10-15 DIAGNOSIS — I10 Essential (primary) hypertension: Secondary | ICD-10-CM | POA: Diagnosis not present

## 2013-10-15 DIAGNOSIS — N61 Mastitis without abscess: Secondary | ICD-10-CM

## 2013-10-15 DIAGNOSIS — R51 Headache: Secondary | ICD-10-CM | POA: Insufficient documentation

## 2013-10-15 DIAGNOSIS — I159 Secondary hypertension, unspecified: Secondary | ICD-10-CM

## 2013-10-15 DIAGNOSIS — T6391XA Toxic effect of contact with unspecified venomous animal, accidental (unintentional), initial encounter: Secondary | ICD-10-CM | POA: Diagnosis not present

## 2013-10-15 DIAGNOSIS — I158 Other secondary hypertension: Secondary | ICD-10-CM | POA: Insufficient documentation

## 2013-10-15 DIAGNOSIS — Y9289 Other specified places as the place of occurrence of the external cause: Secondary | ICD-10-CM | POA: Insufficient documentation

## 2013-10-15 DIAGNOSIS — K219 Gastro-esophageal reflux disease without esophagitis: Secondary | ICD-10-CM | POA: Insufficient documentation

## 2013-10-15 DIAGNOSIS — Z8739 Personal history of other diseases of the musculoskeletal system and connective tissue: Secondary | ICD-10-CM | POA: Diagnosis not present

## 2013-10-15 DIAGNOSIS — Y9389 Activity, other specified: Secondary | ICD-10-CM | POA: Insufficient documentation

## 2013-10-15 DIAGNOSIS — T63441A Toxic effect of venom of bees, accidental (unintentional), initial encounter: Secondary | ICD-10-CM

## 2013-10-15 MED ORDER — CEPHALEXIN 250 MG PO CAPS: 250.0000 mg | ORAL_CAPSULE | Freq: Four times a day (QID) | ORAL | Status: DC

## 2013-10-15 MED ORDER — DIPHENHYDRAMINE HCL 25 MG PO TABS: 25.0000 mg | ORAL_TABLET | Freq: Four times a day (QID) | ORAL | Status: DC | PRN

## 2013-10-15 MED ORDER — LISINOPRIL 20 MG PO TABS
20.0000 mg | ORAL_TABLET | Freq: Once | ORAL | Status: AC
  Administered 2013-10-15: 20 mg via ORAL
  Filled 2013-10-15: qty 1

## 2013-10-15 NOTE — Telephone Encounter (Signed)
Left voicemail on patients phone

## 2013-10-15 NOTE — Telephone Encounter (Signed)
Patient Information:  Caller Name: [name] Walker  Phone: [phone]  Patient: [name] Walker, [name] Walker  Gender: Female  DOB: [birth year]  Age: 53 Years  PCP: Cathlean Cower (Adults only)  Pregnant: No  Office Follow Up:  Does the office need to follow up with this patient?: Yes  Instructions For The Office: Right breast pain & swelling from bee sting  RN Note:  Patient is calling regarding bee sting to right breast just below the nipple.  Red & Swollen the size of a penny and painful. Denies drainage.   Patient is currently at home and 15 minutes away from office.  Symptoms  Reason For Call & Symptoms: bee sting right breast  Reviewed Health History In EMR: Yes  Reviewed Medications In EMR: Yes  Reviewed Allergies In EMR: Yes  Reviewed Surgeries / Procedures: Yes  Date of Onset of Symptoms: 10/11/2013 OB / GYN:  LMP: Unknown  Guideline(s) Used:  Bee Sting  Disposition Per Guideline:   See Today in Office  Reason For Disposition Reached:   Red or very tender (to touch) area, and started over 24 hours after the sting  Advice Given:  Call Back If:  You become worse.  Patient Will Follow Care Advice:  YES

## 2013-10-15 NOTE — Discharge Instructions (Signed)
Make sure to take your blood pressure medications daily. Take ibuprofen for pain. Take benadryl for itching. Keflex until all gone for possible infection. Follow up with primary care doctor for recheck in 2 days, return if worsening.   Bee, Wasp, or Hornet Sting Your caregiver has diagnosed you as having an insect sting. An insect sting appears as a red lump in the skin that sometimes has a tiny hole in the center, or it may have a stinger in the center of the wound. The most common stings are from wasps, hornets and bees. Individuals have different reactions to insect stings.  A normal reaction may cause pain, swelling, and redness around the sting site.  A localized allergic reaction may cause swelling and redness that extends beyond the sting site.  A large local reaction may continue to develop over the next 12 to 36 hours.  On occasion, the reactions can be severe (anaphylactic reaction). An anaphylactic reaction may cause wheezing; difficulty breathing; chest pain; fainting; raised, itchy, red patches on the skin; a sick feeling to your stomach (nausea); vomiting; cramping; or diarrhea. If you have had an anaphylactic reaction to an insect sting in the past, you are more likely to have one again. HOME CARE INSTRUCTIONS   With bee stings, a small sac of poison is left in the wound. Brushing across this with something such as a credit card, or anything similar, will help remove this and decrease the amount of the reaction. This same procedure will not help a wasp sting as they do not leave behind a stinger and poison sac.  Apply a cold compress for 10 to 20 minutes every hour for 1 to 2 days, depending on severity, to reduce swelling and itching.  To lessen pain, a paste made of water and baking soda may be rubbed on the bite or sting and left on for 5 minutes.  To relieve itching and swelling, you may use take medication or apply medicated creams or lotions as directed.  Only take  over-the-counter or prescription medicines for pain, discomfort, or fever as directed by your caregiver.  Wash the sting site daily with soap and water. Apply antibiotic ointment on the sting site as directed.  If you suffered a severe reaction:  If you did not require hospitalization, an adult will need to stay with you for 24 hours in case the symptoms return.  You may need to wear a medical bracelet or necklace stating the allergy.  You and your family need to learn when and how to use an anaphylaxis kit or epinephrine injection.  If you have had a severe reaction before, always carry your anaphylaxis kit with you. SEEK MEDICAL CARE IF:   None of the above helps within 2 to 3 days.  The area becomes red, warm, tender, and swollen beyond the area of the bite or sting.  You have an oral temperature above 102 F (38.9 C). SEEK IMMEDIATE MEDICAL CARE IF:  You have symptoms of an allergic reaction which are:  Wheezing.  Difficulty breathing.  Chest pain.  Lightheadedness or fainting.  Itchy, raised, red patches on the skin.  Nausea, vomiting, cramping or diarrhea. ANY OF THESE SYMPTOMS MAY REPRESENT A SERIOUS PROBLEM THAT IS AN EMERGENCY. Do not wait to see if the symptoms will go away. Get medical help right away. Call your local emergency services (911 in [place] DO NOT drive yourself to the hospital. MAKE SURE YOU:   Understand these instructions.  Will watch your  condition.  Will get help right away if you are not doing well or get worse. Document Released: 02/13/2005 Document Revised: 05/08/2011 Document Reviewed: 07/31/2009 [facility] Patient Information 2015 [place], [place]. This information is not intended to replace advice given to you by your health care provider. Make sure you discuss any questions you have with your health care provider.   Cellulitis Cellulitis is an infection of the skin and the tissue beneath it. The infected area is usually red and tender.  Cellulitis occurs most often in the arms and lower legs.  CAUSES  Cellulitis is caused by bacteria that enter the skin through cracks or cuts in the skin. The most common types of bacteria that cause cellulitis are staphylococci and streptococci. SIGNS AND SYMPTOMS   Redness and warmth.  Swelling.  Tenderness or pain.  Fever. DIAGNOSIS  Your health care provider can usually determine what is wrong based on a physical exam. Blood tests may also be done. TREATMENT  Treatment usually involves taking an antibiotic medicine. HOME CARE INSTRUCTIONS   Take your antibiotic medicine as directed by your health care provider. Finish the antibiotic even if you start to feel better.  Keep the infected arm or leg elevated to reduce swelling.  Apply a warm cloth to the affected area up to 4 times per day to relieve pain.  Take medicines only as directed by your health care provider.  Keep all follow-up visits as directed by your health care provider. SEEK MEDICAL CARE IF:   You notice red streaks coming from the infected area.  Your red area gets larger or turns dark in color.  Your bone or joint underneath the infected area becomes painful after the skin has healed.  Your infection returns in the same area or another area.  You notice a swollen bump in the infected area.  You develop new symptoms.  You have a fever. SEEK IMMEDIATE MEDICAL CARE IF:   You feel very sleepy.  You develop vomiting or diarrhea.  You have a general ill feeling (malaise) with muscle aches and pains. MAKE SURE YOU:   Understand these instructions.  Will watch your condition.  Will get help right away if you are not doing well or get worse. Document Released: 11/23/2004 Document Revised: 06/30/2013 Document Reviewed: 05/01/2011 [facility] Patient Information 2015 [place], [place]. This information is not intended to replace advice given to you by your health care provider. Make sure you discuss any  questions you have with your health care provider.

## 2013-10-15 NOTE — ED Provider Notes (Signed)
CSN: [id number]     Arrival date & time 10/15/13  1215 History  This chart was scribed for non-physician practitioner, Jeannett Senior, PA-C working with Carmin Muskrat, MD by Frederich Balding, ED scribe. This patient was seen in room WTR7/WTR7 and the patient's care was started at 12:41 PM.   Chief Complaint  Patient presents with  . Insect Bite   The history is provided by the patient. No language interpreter was used.   HPI Comments: [name] Walker is a 53 y.o. female who presents to the Emergency Department complaining of an insect bite to her right breast that occurred 4 days ago. She states something flew into her shirt and stung her. Pt is unsure if it was a bee but she is pretty sure it was. Reports continued pain, redness and itching around the area. She has used hydrocortisone cream with no relief. Pt has not taken her blood pressure medication today. She reports headache.   Past Medical History  Diagnosis Date  . ACHILLES TENDINITIS   . ALLERGIC RHINITIS   . GERD   . HYPERTENSION   . LATERAL EPICONDYLITIS, RIGHT   . LOW BACK PAIN   . Plantar fascial fibromatosis   . SUBACROMIAL BURSITIS, RIGHT    Past Surgical History  Procedure Laterality Date  . Tubal ligation     Family History  Problem Relation Age of Onset  . Cancer Sister     colon cancer  . Arthritis Other   . Diabetes Other   . Hypertension Other   . Cancer Other     breast cancer and prostate cancer  . Stroke Other    History  Substance Use Topics  . Smoking status: Current Every Day Smoker -- 1.00 packs/day    Types: Cigarettes  . Smokeless tobacco: Not on file  . Alcohol Use: No   OB History   Grav Para Term Preterm Abortions TAB SAB Ect Mult Living                 Review of Systems  Skin: Positive for color change and wound.  Neurological: Positive for headaches.  All other systems reviewed and are negative.  Allergies  Other and Tramadol  Home Medications   Prior to Admission  medications   Medication Sig Start Date End Date Taking? Authorizing Provider  ALPRAZolam (XANAX) 0.25 MG tablet Take 1 tablet (0.25 mg total) by mouth 2 (two) times daily as needed for anxiety (Take up to 3 times daily as needed for panic attacks). 07/16/12   Biagio Borg, MD  lactulose (CHRONULAC) 10 GM/15ML solution TAKE 30-60 CC BY MOUTH TWICE DAILY AS NEEDED FOR CONSTIPATION 08/23/12   Biagio Borg, MD  lisinopril (PRINIVIL,ZESTRIL) 20 MG tablet TAKE ONE TABLET BY MOUTH ONCE DAILY    Biagio Borg, MD  meloxicam (MOBIC) 15 MG tablet Take 1 tablet (15 mg total) by mouth daily. 10/01/13   Biagio Borg, MD  pantoprazole (PROTONIX) 40 MG tablet Take 1 tablet (40 mg total) by mouth daily. 04/11/13   Biagio Borg, MD  sertraline (ZOLOFT) 50 MG tablet Take 1 tablet (50 mg total) by mouth daily. 10/01/13   Biagio Borg, MD  traMADol (ULTRAM) 50 MG tablet Take 1 tablet (50 mg total) by mouth every 6 (six) hours as needed for pain. 09/09/12   Biagio Borg, MD   BP 184/111  Pulse 77  Temp(Src) 98.8 F (37.1 C) (Oral)  Resp 16  SpO2 100%  LMP  08/22/2010  Physical Exam  Nursing note and vitals reviewed. Constitutional: She is oriented to person, place, and time. She appears well-developed and well-nourished. No distress.  HENT:  Head: Normocephalic and atraumatic.  Eyes: Conjunctivae and EOM are normal.  Neck: Neck supple. No tracheal deviation present.  Cardiovascular: Normal rate.   Pulmonary/Chest: Effort normal. No respiratory distress.  Musculoskeletal: Normal range of motion.  Neurological: She is alert and oriented to person, place, and time.  Skin: Skin is warm and dry.  Small scabbing pustule to the medial right breast with mild surrounding erythema, warmth to the touch, tenderness to palpation. No drainage. No induration or fluctuance  Psychiatric: She has a normal mood and affect. Her behavior is normal.    ED Course  Procedures (including critical care time)  DIAGNOSTIC  STUDIES: Oxygen Saturation is 100% on RA, normal by my interpretation.    COORDINATION OF CARE: 12:43 PM-Discussed treatment plan which includes an antibiotic, benadryl and continuing hydrocortisone cream with pt at bedside and pt agreed to plan.   Labs Review Labs Reviewed - No data to display  Imaging Review No results found.   EKG Interpretation None      MDM   Final diagnoses:  Cellulitis of breast  Bee sting, accidental or unintentional, initial encounter  Secondary hypertension, unspecified    Pt with possible cellulitis from a insect sting, 4 days ago. Will start on keflex. Benadryl. Follow up with PCP. Pt is afebrile. She is hypertensive, did not take her meds this morning. Asymptomatic for htn. Dose of lisinopril given prior to discharge.   Filed Vitals:   10/15/13 1231 10/15/13 1305  BP: 184/111 182/109  Pulse: 77   Temp: 98.8 F (37.1 C)   TempSrc: Oral   Resp: 16   SpO2: 100%      I personally performed the services described in this documentation, which was scribed in my presence. The recorded information has been reviewed and is accurate.  Renold Genta, PA-C 10/15/13 2035

## 2013-10-15 NOTE — ED Notes (Signed)
Pt reports she has not had BP meds today. BP 184/111.

## 2013-10-15 NOTE — Telephone Encounter (Signed)
Kayenta for OV if pt desires, but could hold off if no worsening redness, pain, fever, swelling; can also try benadryl cream OTC

## 2013-10-15 NOTE — ED Notes (Signed)
Pt stung on Saturday on right breast by a bee. Pt says that "it is not getting better and it still hurts." Pt denies sob or throat swelling.

## 2013-10-18 NOTE — ED Provider Notes (Signed)
Medical screening examination/treatment/procedure(s) were performed by non-physician practitioner and as supervising physician I was immediately available for consultation/collaboration.   EKG Interpretation None        Houston Siren III, MD 10/18/13 2038

## 2013-11-29 ENCOUNTER — Other Ambulatory Visit: Payer: Self-pay | Admitting: Internal Medicine

## 2014-01-21 ENCOUNTER — Ambulatory Visit (INDEPENDENT_AMBULATORY_CARE_PROVIDER_SITE_OTHER): Payer: BC Managed Care – PPO | Admitting: Internal Medicine

## 2014-01-21 ENCOUNTER — Encounter: Payer: Self-pay | Admitting: Internal Medicine

## 2014-01-21 VITALS — BP 132/80 | HR 95 | Temp 98.4°F | Ht 62.0 in | Wt 153.2 lb

## 2014-01-21 DIAGNOSIS — N6314 Unspecified lump in the right breast, lower inner quadrant: Secondary | ICD-10-CM

## 2014-01-21 DIAGNOSIS — N63 Unspecified lump in breast: Secondary | ICD-10-CM

## 2014-01-21 DIAGNOSIS — I10 Essential (primary) hypertension: Secondary | ICD-10-CM

## 2014-01-21 NOTE — Progress Notes (Signed)
Subjective:    Patient ID: [name] Walker, female    DOB: 02/19/1961, 53 y.o.   MRN: [id number]  HPI  Here with 1 wk onset localized right breast tenderness without fever, skin change, red/swelling, drainage, trauma, or nipple d/c.  No prior hx. No recent mammogram.  Not ill feeling or toxic.  No hx of malignancy or prevous breast bx. Past Medical History  Diagnosis Date  . ACHILLES TENDINITIS   . ALLERGIC RHINITIS   . GERD   . HYPERTENSION   . LATERAL EPICONDYLITIS, RIGHT   . LOW BACK PAIN   . Plantar fascial fibromatosis   . SUBACROMIAL BURSITIS, RIGHT    Past Surgical History  Procedure Laterality Date  . Tubal ligation      reports that she has been smoking Cigarettes.  She has been smoking about 1.00 pack per day. She does not have any smokeless tobacco history on file. She reports that she does not drink alcohol. Her drug history is not on file. family history includes Arthritis in her other; Cancer in her other and sister; Diabetes in her other; Hypertension in her other; Stroke in her other. Allergies  Allergen Reactions  . Other Palpitations    ALL MUSCLE RELAXERS-PER PATIENT  . Tramadol Other (See Comments)    Hyperactivity, can't sleep   Current Outpatient Prescriptions on File Prior to Visit  Medication Sig Dispense Refill  . ibuprofen (ADVIL,MOTRIN) 800 MG tablet Take 800 mg by mouth every 8 (eight) hours as needed for mild pain or moderate pain.    Marland Kitchen lisinopril (PRINIVIL,ZESTRIL) 20 MG tablet Take 20 mg by mouth daily.    Marland Kitchen lisinopril (PRINIVIL,ZESTRIL) 20 MG tablet TAKE ONE TABLET BY MOUTH ONCE DAILY 90 tablet 1  . pantoprazole (PROTONIX) 40 MG tablet Take 1 tablet (40 mg total) by mouth daily. 90 tablet 3  . traMADol (ULTRAM) 50 MG tablet Take 1 tablet (50 mg total) by mouth every 6 (six) hours as needed for pain. 60 tablet 1  . diphenhydrAMINE (BENADRYL) 25 MG tablet Take 1 tablet (25 mg total) by mouth every 6 (six) hours as needed. (Patient not taking:  Reported on 01/21/2014) 30 tablet 0   No current facility-administered medications on file prior to visit.   Review of Systems  Constitutional: Negative for unusual diaphoresis or other sweats  HENT: Negative for ringing in ear Eyes: Negative for double vision or worsening visual disturbance.  Respiratory: Negative for choking and stridor.   Gastrointestinal: Negative for vomiting or other signifcant bowel change Genitourinary: Negative for hematuria or decreased urine volume.  Musculoskeletal: Negative for other MSK pain or swelling Skin: Negative for color change and worsening wound.  Neurological: Negative for tremors and numbness other than noted  Psychiatric/Behavioral: Negative for decreased concentration or agitation other than above       Objective:   Physical Exam BP 132/80 mmHg  Pulse 95  Temp(Src) 98.4 F (36.9 C) (Oral)  Ht 5\' 2"  (1.575 m)  Wt 153 lb 4 oz (69.514 kg)  BMI 28.02 kg/m2  SpO2 95%  LMP 08/22/2010 VS noted, not ill appearing Constitutional: Pt appears well-developed, well-nourished.  HENT: Head: NCAT.  Right Ear: External ear normal.  Left Ear: External ear normal.  Eyes: . Pupils are equal, round, and reactive to light. Conjunctivae and EOM are normal Neck: Normal range of motion. Neck supple.  Cardiovascular: Normal rate and regular rhythm.   Pulmonary/Chest: Effort normal and breath sounds normal.  Bilat breasts without abnormal except for  4oclock position small tender subq lump estimated pea sized, no overlying skin change, no nipple d/c Neurological: Pt is alert. Not confused , motor grossly intact Skin: Skin is warm. No rash Psychiatric: Pt behavior is normal. No agitation.     Assessment & Plan:

## 2014-01-21 NOTE — Assessment & Plan Note (Signed)
stable overall by history and exam, recent data reviewed with pt, and pt to continue medical treatment as before,  to f/u any worsening symptoms or concerns BP Readings from Last 3 Encounters:  01/21/14 132/80  10/15/13 182/109  10/01/13 133/94

## 2014-01-21 NOTE — Assessment & Plan Note (Signed)
Suspect prob painful cystic lesion, cant r/o malignancy completely, for diag mammogram/ultrasound,  to f/u any worsening symptoms or concerns

## 2014-01-21 NOTE — Patient Instructions (Signed)
Please continue all other medications as before, and refills have been done if requested.  Please have the pharmacy call with any other refills you may need.  Please keep your appointments with your specialists as you may have planned  You will be contacted regarding the referral for: Diagnostic Mammogram

## 2014-01-21 NOTE — Progress Notes (Signed)
Pre visit review using our clinic review tool, if applicable. No additional management support is needed unless otherwise documented below in the visit note. 

## 2014-02-18 ENCOUNTER — Ambulatory Visit
Admission: RE | Admit: 2014-02-18 | Discharge: 2014-02-18 | Disposition: A | Discharge status: Home / Self Care | Payer: BC Managed Care – PPO | Source: Ambulatory Visit | Attending: Internal Medicine | Admitting: Internal Medicine

## 2014-02-18 DIAGNOSIS — N6314 Unspecified lump in the right breast, lower inner quadrant: Secondary | ICD-10-CM

## 2014-02-25 ENCOUNTER — Telehealth: Payer: Self-pay | Admitting: Internal Medicine

## 2014-02-25 NOTE — Telephone Encounter (Signed)
Pt request result for the breast ultrasound that she did last week. Please advise.

## 2014-02-25 NOTE — Telephone Encounter (Signed)
Chart indicates dec 23 mamogram and u/s results were d/w pt per Dr Glennon Mac, and written information given as well  Mammogram and u/s were negative for suspicious findings, and to f/u at one year

## 2014-02-26 NOTE — Telephone Encounter (Signed)
Patient informed of results.  

## 2014-03-01 ENCOUNTER — Emergency Department (HOSPITAL_COMMUNITY): Payer: BC Managed Care – PPO

## 2014-03-01 ENCOUNTER — Emergency Department (HOSPITAL_COMMUNITY)
Admission: EM | Admit: 2014-03-01 | Discharge: 2014-03-01 | Disposition: A | Discharge status: Home / Self Care | Payer: BC Managed Care – PPO | Attending: Emergency Medicine | Admitting: Emergency Medicine

## 2014-03-01 ENCOUNTER — Encounter (HOSPITAL_COMMUNITY): Payer: Self-pay | Admitting: Emergency Medicine

## 2014-03-01 DIAGNOSIS — Z8709 Personal history of other diseases of the respiratory system: Secondary | ICD-10-CM | POA: Insufficient documentation

## 2014-03-01 DIAGNOSIS — R002 Palpitations: Secondary | ICD-10-CM | POA: Insufficient documentation

## 2014-03-01 DIAGNOSIS — Z8739 Personal history of other diseases of the musculoskeletal system and connective tissue: Secondary | ICD-10-CM | POA: Insufficient documentation

## 2014-03-01 DIAGNOSIS — K219 Gastro-esophageal reflux disease without esophagitis: Secondary | ICD-10-CM | POA: Insufficient documentation

## 2014-03-01 DIAGNOSIS — I10 Essential (primary) hypertension: Secondary | ICD-10-CM | POA: Diagnosis not present

## 2014-03-01 DIAGNOSIS — R079 Chest pain, unspecified: Secondary | ICD-10-CM | POA: Diagnosis not present

## 2014-03-01 DIAGNOSIS — Z720 Tobacco use: Secondary | ICD-10-CM | POA: Diagnosis not present

## 2014-03-01 DIAGNOSIS — Z79899 Other long term (current) drug therapy: Secondary | ICD-10-CM | POA: Diagnosis not present

## 2014-03-01 LAB — I-STAT TROPONIN, ED: Troponin i, poc: 0 ng/mL (ref 0.00–0.08)

## 2014-03-01 LAB — CBC
HCT: 44.5 % (ref 36.0–46.0)
Hemoglobin: 14.9 g/dL (ref 12.0–15.0)
MCH: 28.8 pg (ref 26.0–34.0)
MCHC: 33.5 g/dL (ref 30.0–36.0)
MCV: 85.9 fL (ref 78.0–100.0)
Platelets: 133 10*3/uL — ABNORMAL LOW (ref 150–400)
RBC: 5.18 MIL/uL — ABNORMAL HIGH (ref 3.87–5.11)
RDW: 14.3 % (ref 11.5–15.5)
WBC: 5.4 10*3/uL (ref 4.0–10.5)

## 2014-03-01 LAB — BASIC METABOLIC PANEL
Anion gap: 4 — ABNORMAL LOW (ref 5–15)
BUN: 14 mg/dL (ref 6–23)
CO2: 26 mmol/L (ref 19–32)
Calcium: 9.4 mg/dL (ref 8.4–10.5)
Chloride: 111 mEq/L (ref 96–112)
Creatinine, Ser: 0.5 mg/dL (ref 0.50–1.10)
GFR calc Af Amer: >90 mL/min (ref >90)
GFR calc non Af Amer: >90 mL/min (ref >90)
Glucose, Bld: 95 mg/dL (ref 70–99)
Potassium: 3.4 mmol/L — ABNORMAL LOW (ref 3.5–5.1)
Sodium: 141 mmol/L (ref 135–145)

## 2014-03-01 MED ORDER — LISINOPRIL 40 MG PO TABS: 40.0000 mg | ORAL_TABLET | Freq: Every day | ORAL | Status: DC

## 2014-03-01 MED ORDER — GI COCKTAIL ~~LOC~~
30.0000 mL | Freq: Once | ORAL | Status: AC
  Administered 2014-03-01: 30 mL via ORAL
  Filled 2014-03-01: qty 30

## 2014-03-01 MED ORDER — KETOROLAC TROMETHAMINE 15 MG/ML IJ SOLN
15.0000 mg | Freq: Once | INTRAMUSCULAR | Status: AC
  Administered 2014-03-01: 15 mg via INTRAVENOUS
  Filled 2014-03-01: qty 1

## 2014-03-01 MED ORDER — HYDROCHLOROTHIAZIDE 25 MG PO TABS: 25.0000 mg | ORAL_TABLET | Freq: Every day | ORAL | Status: DC

## 2014-03-01 MED ORDER — PANTOPRAZOLE SODIUM 40 MG PO TBEC: 40.0000 mg | DELAYED_RELEASE_TABLET | Freq: Every day | ORAL | Status: DC

## 2014-03-01 MED ORDER — MORPHINE SULFATE 4 MG/ML IJ SOLN
4.0000 mg | Freq: Once | INTRAMUSCULAR | Status: AC
  Administered 2014-03-01: 4 mg via INTRAVENOUS
  Filled 2014-03-01: qty 1

## 2014-03-01 MED ORDER — ONDANSETRON HCL 4 MG/2ML IJ SOLN
4.0000 mg | Freq: Once | INTRAMUSCULAR | Status: AC
  Administered 2014-03-01: 4 mg via INTRAVENOUS
  Filled 2014-03-01: qty 2

## 2014-03-01 NOTE — ED Notes (Signed)
Pt reports understanding of discharge instructions and denies further questions at this time. Pt escorted via wheelchair to discharge with all papers and Rx.

## 2014-03-01 NOTE — ED Provider Notes (Signed)
CSN: [id number]     Arrival date & time 03/01/14  1130 History   First MD Initiated Contact with Patient 03/01/14 1315     Chief Complaint  Patient presents with  . Palpitations     (Consider location/radiation/quality/duration/timing/severity/associated sxs/prior Treatment) HPI   53yF with CP and palpitations. Burning sensation substernally to base of throat. Constant since last night. No appreciable exacerbating or relieving factors. No SOB. No diaphoresis. No n/v. Occasional palpitations. Skipping beats/fluttering. Single beat up to several seconds at times. Denies dizziness, lightheadedness. No fever.   Past Medical History  Diagnosis Date  . ACHILLES TENDINITIS   . ALLERGIC RHINITIS   . GERD   . HYPERTENSION   . LATERAL EPICONDYLITIS, RIGHT   . LOW BACK PAIN   . Plantar fascial fibromatosis   . SUBACROMIAL BURSITIS, RIGHT    Past Surgical History  Procedure Laterality Date  . Tubal ligation    . Kidney surgery     Family History  Problem Relation Age of Onset  . Cancer Sister     colon cancer  . Arthritis Other   . Diabetes Other   . Hypertension Other   . Cancer Other     breast cancer and prostate cancer  . Stroke Other    History  Substance Use Topics  . Smoking status: Current Every Day Smoker -- 1.00 packs/day    Types: Cigarettes  . Smokeless tobacco: Not on file  . Alcohol Use: No   OB History    No data available     Review of Systems All systems reviewed and negative, other than as noted in HPI.    Allergies  Other  Home Medications   Prior to Admission medications   Medication Sig Start Date End Date Taking? Authorizing Provider  ibuprofen (ADVIL,MOTRIN) 800 MG tablet Take 800 mg by mouth every 8 (eight) hours as needed for mild pain or moderate pain.   Yes Historical Provider, MD  lisinopril (PRINIVIL,ZESTRIL) 20 MG tablet Take 20 mg by mouth daily.   Yes Historical Provider, MD  sodium chloride (OCEAN) 0.65 % SOLN nasal spray Place 1-2  sprays into both nostrils daily as needed for congestion.   Yes Historical Provider, MD  diphenhydrAMINE (BENADRYL) 25 MG tablet Take 1 tablet (25 mg total) by mouth every 6 (six) hours as needed. Patient not taking: Reported on 01/21/2014 10/15/13   Tatyana A Kirichenko, PA-C  lisinopril (PRINIVIL,ZESTRIL) 20 MG tablet TAKE ONE TABLET BY MOUTH ONCE DAILY Patient not taking: Reported on 03/01/2014 12/01/13   Biagio Borg, MD  pantoprazole (PROTONIX) 40 MG tablet Take 1 tablet (40 mg total) by mouth daily. Patient not taking: Reported on 03/01/2014 04/11/13   Biagio Borg, MD  traMADol (ULTRAM) 50 MG tablet Take 1 tablet (50 mg total) by mouth every 6 (six) hours as needed for pain. Patient not taking: Reported on 03/01/2014 09/09/12   Biagio Borg, MD   BP 227/111 mmHg  Pulse 79  Temp(Src) 98.3 F (36.8 C) (Oral)  Resp 18  Wt 150 lb (68.04 kg)  LMP 08/22/2010 Physical Exam  Constitutional: She appears well-developed and well-nourished. No distress.  HENT:  Head: Normocephalic and atraumatic.  Eyes: Conjunctivae are normal. Right eye exhibits no discharge. Left eye exhibits no discharge.  Neck: Neck supple.  Cardiovascular: Normal rate, regular rhythm and normal heart sounds.  Exam reveals no gallop and no friction rub.   No murmur heard. Pulmonary/Chest: Effort normal and breath sounds normal. No respiratory distress.  She exhibits no tenderness.  Abdominal: Soft. She exhibits no distension. There is no tenderness.  Musculoskeletal: She exhibits no edema or tenderness.  Lower extremities symmetric as compared to each other. No calf tenderness. Negative Homan's. No palpable cords.   Neurological: She is alert.  Skin: Skin is warm and dry.  Psychiatric: She has a normal mood and affect. Her behavior is normal. Thought content normal.  Nursing note and vitals reviewed.   ED Course  Procedures (including critical care time) Labs Review Labs Reviewed  BASIC METABOLIC PANEL - Abnormal; Notable  for the following:    Potassium 3.4 (*)    Anion gap 4 (*)    All other components within normal limits  CBC - Abnormal; Notable for the following:    RBC 5.18 (*)    Platelets 133 (*)    All other components within normal limits  Randolm Idol, ED    Imaging Review Dg Chest Port 1 View  03/01/2014   CLINICAL DATA:  Palpitations.  EXAM: PORTABLE CHEST - 1 VIEW  COMPARISON:  02/07/2009  FINDINGS: The patient has a right-sided aortic arch. 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  Pulmonary vascularity is normal and the lungs are clear. Thoracic scoliosis has increased. No effusions.  IMPRESSION: No acute abnormalities. Increasing thoracic scoliosis. Right aortic arch.   Electronically Signed   By: Rozetta Nunnery M.D.   On: 03/01/2014 12:09     EKG Interpretation   Date/Time:  Sunday March 01 2014 11:44:19 EST Ventricular Rate:  78 PR Interval:  122 QRS Duration: 77 QT Interval:  317 QTC Calculation: 361 R Axis:   44 Text Interpretation:  Sinus rhythm Non-specific ST-t changes since last  tracing no significant change Confirmed by Sunset Joshi  MD, Daneen Volcy ([id number]) on  03/01/2014 2:58:23 PM      MDM   Final diagnoses:  Chest pain, unspecified chest pain type  Palpitations    53yF with atypical CP and palpitations. Reassurring w/u. Description of CP seems most consistent with GI etiology. Doubt ACS, PE, Infectious, Dissection, etc.     Virgel Manifold, MD 03/05/14 1451

## 2014-03-01 NOTE — ED Notes (Signed)
Pt from home c/o heartburn and fluttering that started last pm. She Zantac with minimal relief. Denies shortness of breath

## 2014-03-01 NOTE — ED Notes (Signed)
MD Mad aware of  hypertension

## 2014-03-01 NOTE — Discharge Instructions (Signed)
Palpitations  A palpitation is the feeling that your heartbeat is irregular. It may feel like your heart is fluttering or skipping a beat. It may also feel like your heart is beating faster than normal. This is usually not a serious problem. In some cases, you may need more medical tests.  HOME CARE  · Avoid:  ¨ Caffeine in coffee, tea, soft drinks, diet pills, and energy drinks.  ¨ Chocolate.  ¨ Alcohol.  · Stop smoking if you smoke.  · Reduce your stress and anxiety. Try:  ¨ A method that measures bodily functions so you can learn to control them (biofeedback).  ¨ Yoga.  ¨ Meditation.  ¨ Physical activity such as swimming, jogging, or walking.  · Get plenty of rest and sleep.  GET HELP IF:  · Your fast or irregular heartbeat continues after 24 hours.  · Your palpitations occur more often.  GET HELP RIGHT AWAY IF:   · You have chest pain.  · You feel short of breath.  · You have a very bad headache.  · You feel dizzy or pass out (faint).  MAKE SURE YOU:   · Understand these instructions.  · Will watch your condition.  · Will get help right away if you are not doing well or get worse.  Document Released: 11/23/2007 Document Revised: 06/30/2013 Document Reviewed: 04/14/2011  [employer]® Patient Information ©2015 [employer]. This information is not intended to replace advice given to you by your health care provider. Make sure you discuss any questions you have with your health care provider.

## 2014-05-17 ENCOUNTER — Other Ambulatory Visit: Payer: Self-pay | Admitting: Internal Medicine

## 2014-05-18 ENCOUNTER — Other Ambulatory Visit: Payer: Self-pay | Admitting: *Deleted

## 2014-05-18 MED ORDER — PANTOPRAZOLE SODIUM 40 MG PO TBEC: 40.0000 mg | DELAYED_RELEASE_TABLET | Freq: Every day | ORAL | Status: DC

## 2014-05-26 ENCOUNTER — Other Ambulatory Visit: Payer: Self-pay | Admitting: *Deleted

## 2014-05-26 MED ORDER — LISINOPRIL 40 MG PO TABS: 40.0000 mg | ORAL_TABLET | Freq: Every day | ORAL | Status: DC

## 2014-10-02 ENCOUNTER — Other Ambulatory Visit: Payer: Self-pay | Admitting: Internal Medicine

## 2014-10-07 ENCOUNTER — Other Ambulatory Visit: Payer: Self-pay | Admitting: Internal Medicine

## 2014-11-30 ENCOUNTER — Other Ambulatory Visit: Payer: Self-pay

## 2014-11-30 DIAGNOSIS — Z1231 Encounter for screening mammogram for malignant neoplasm of breast: Secondary | ICD-10-CM

## 2014-12-04 ENCOUNTER — Ambulatory Visit
Admission: RE | Admit: 2014-12-04 | Discharge: 2014-12-04 | Disposition: A | Discharge status: Home / Self Care | Payer: BC Managed Care – PPO | Source: Ambulatory Visit

## 2014-12-04 DIAGNOSIS — Z1231 Encounter for screening mammogram for malignant neoplasm of breast: Secondary | ICD-10-CM

## 2014-12-05 ENCOUNTER — Encounter: Payer: Self-pay | Admitting: Family Medicine

## 2014-12-05 ENCOUNTER — Ambulatory Visit (INDEPENDENT_AMBULATORY_CARE_PROVIDER_SITE_OTHER): Payer: BC Managed Care – PPO | Admitting: Family Medicine

## 2014-12-05 VITALS — BP 170/120 | HR 90 | Temp 98.2°F | Ht 62.0 in | Wt 143.0 lb

## 2014-12-05 DIAGNOSIS — I10 Essential (primary) hypertension: Secondary | ICD-10-CM | POA: Diagnosis not present

## 2014-12-05 DIAGNOSIS — J209 Acute bronchitis, unspecified: Secondary | ICD-10-CM

## 2014-12-05 MED ORDER — AMLODIPINE BESYLATE 5 MG PO TABS: 5.0000 mg | ORAL_TABLET | Freq: Every day | ORAL | Status: DC

## 2014-12-05 MED ORDER — HYDROCODONE-HOMATROPINE 5-1.5 MG/5ML PO SYRP: 5.0000 mL | ORAL_SOLUTION | Freq: Three times a day (TID) | ORAL | Status: DC | PRN

## 2014-12-05 MED ORDER — AZITHROMYCIN 250 MG PO TABS: ORAL_TABLET | ORAL | Status: DC

## 2014-12-05 MED ORDER — ALBUTEROL SULFATE HFA 108 (90 BASE) MCG/ACT IN AERS: 2.0000 | INHALATION_SPRAY | Freq: Four times a day (QID) | RESPIRATORY_TRACT | Status: DC | PRN

## 2014-12-05 NOTE — Assessment & Plan Note (Signed)
Pt may have all rxn to lisinopril and bp is high off of it  Px amlodipine 5 mg daily -rev poss side eff/will update Urged to quit smoking  F/u with PCP in next 2 weeks

## 2014-12-05 NOTE — Assessment & Plan Note (Signed)
In smoker  Cover with zpak Albuterol prn - pt knows how to use inhaler Hycodan with caution for cough Rest/fluids  Disc symptomatic care - see instructions on AVS  Update if not starting to improve in a week or if worsening    Urged to quit smoking

## 2014-12-05 NOTE — Progress Notes (Signed)
Pre visit review using our clinic review tool, if applicable. No additional management support is needed unless otherwise documented below in the visit note. 

## 2014-12-05 NOTE — Progress Notes (Signed)
Subjective:    Patient ID: [name] Walker, female    DOB: Aug 08, 1960, 54 y.o.   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 Breast lump on right side at 4 o'clock position 01/21/2014  . Hot flashes 10/01/2013  . Grief reaction 07/17/2012  . Family history of colon cancer 07/02/2012  . Preventative health care 08/23/2010  . Plantar fasciitis 08/23/2010  . ANKLE PAIN, BILATERAL 10/19/2008  . VAGINITIS 09/10/2008  . JOINT EFFUSION, ANKLE 09/10/2008  . SUBACROMIAL BURSITIS, RIGHT 09/01/2008  . ACHILLES TENDINITIS 09/01/2008  . LATERAL EPICONDYLITIS, RIGHT 08/18/2008  . Essential hypertension 07/12/2007  . ALLERGIC RHINITIS 07/12/2007  . GERD 07/12/2007  . LOW BACK PAIN 07/12/2007  . NEPHROLITHIASIS, HX OF 07/12/2007   Past Medical History  Diagnosis Date  . ACHILLES TENDINITIS   . ALLERGIC RHINITIS   . GERD   . HYPERTENSION   . LATERAL EPICONDYLITIS, RIGHT   . LOW BACK PAIN   . Plantar fascial fibromatosis   . SUBACROMIAL BURSITIS, RIGHT    Past Surgical History  Procedure Laterality Date  . Tubal ligation    . Kidney surgery     Social History  Substance Use Topics  . Smoking status: Current Every Day Smoker -- 1.00 packs/day    Types: Cigarettes  . Smokeless tobacco: None  . Alcohol Use: No   Family History  Problem Relation Age of Onset  . Cancer Sister     colon cancer  . Arthritis Other   . Diabetes Other   . Hypertension Other   . Cancer Other     breast cancer and prostate cancer  . Stroke Other     Allergies  Allergen Reactions  . Other Palpitations    ALL MUSCLE RELAXERS-PER PATIENT   Current Outpatient Prescriptions on File Prior to Visit  Medication Sig Dispense Refill  . ibuprofen (ADVIL,MOTRIN) 800 MG tablet Take 800 mg by mouth every 8 (eight) hours as needed for mild pain or moderate pain.    Marland Kitchen lisinopril (PRINIVIL,ZESTRIL) 40 MG tablet Take 1 tablet (40 mg total) by mouth daily. 90 tablet 2  . sodium chloride (OCEAN) 0.65 % SOLN nasal spray Place 1-2 sprays into both nostrils daily as needed for congestion.    . diphenhydrAMINE (BENADRYL) 25 MG tablet Take 1 tablet (25 mg total) by mouth every 6 (six) hours as needed. (Patient not taking: Reported on 01/21/2014) 30 tablet 0  . hydrochlorothiazide (HYDRODIURIL) 25 MG tablet Take 1 tablet (25 mg total) by mouth daily. (Patient not taking: Reported on 12/05/2014) 30 tablet 0  . pantoprazole (PROTONIX) 40 MG tablet Take 1 tablet (40 mg total) by mouth daily. (Patient not taking: Reported on 12/05/2014) 90 tablet 2   No current facility-administered medications on file prior to visit.    Review of Systems    Review of Systems  Constitutional: Negative for fever, appetite change, and unexpected  weight change. pos for fatigue  Eyes: Negative for pain and visual disturbance.  ENT pos for cong and rhinorrhea/neg for sinus pain  Respiratory: Negative for  shortness of breath.   Cardiovascular: Negative for cp or palpitations   neg for pedal edema  Gastrointestinal: Negative for nausea, diarrhea and constipation.  Genitourinary: Negative for urgency and frequency.  Skin: Negative for pallor or rash   Neurological: Negative for weakness, light-headedness, numbness and headaches.  Hematological: Negative for adenopathy. Does not bruise/bleed easily.  Psychiatric/Behavioral: Negative for dysphoric mood. The patient is not nervous/anxious.      Objective:   Physical Exam  Constitutional: She appears well-developed and  well-nourished. No distress.  Well appearing  HENT:  Head: Normocephalic and atraumatic.  Right Ear: External ear normal.  Left Ear: External ear normal.  Mouth/Throat: Oropharynx is clear and moist.  Nares are injected and congested  No sinus tenderness Clear rhinorrhea and post nasal drip   Eyes: Conjunctivae and EOM are normal. Pupils are equal, round, and reactive to light. Right eye exhibits no discharge. Left eye exhibits no discharge.  Neck: Normal range of motion. Neck supple. No JVD present. Carotid bruit is not present.  Cardiovascular: Normal rate and normal heart sounds.   Pulmonary/Chest: Effort normal and breath sounds normal. No respiratory distress. She has no wheezes. She has no rales. She exhibits no tenderness.  bs are mildly distant Scant wheeze on forced exp   No rales   Musculoskeletal: She exhibits no edema.  Lymphadenopathy:    She has no cervical adenopathy.  Neurological: She is alert.  Skin: Skin is warm and dry. No rash noted.  Psychiatric: She has a normal mood and affect.          Assessment & Plan:   Problem List Items Addressed This Visit      Cardiovascular and Mediastinum   Essential hypertension - Primary    Pt may have all rxn to lisinopril and bp is high off of it  Px amlodipine 5 mg daily -rev poss side eff/will update Urged to quit smoking  F/u with PCP in next 2 weeks       Relevant Medications   amLODipine (NORVASC) 5 MG tablet     Respiratory   Acute bronchitis    In smoker  Cover with zpak Albuterol prn - pt knows how to use inhaler Hycodan with caution for cough Rest/fluids  Disc symptomatic care - see instructions on AVS  Update if not starting to improve in a week or if worsening    Urged to quit smoking

## 2014-12-05 NOTE — Patient Instructions (Signed)
Take care of yourself  Work on quitting smoking  Take the zpak for bronchitis as directed Use inhaler when needed for wheezing  Try hycodan for cough when not working or driving Drink lots of fluids Stop lisinopril  Start amlodipine 5 mg once daily for blood pressure - update [place] if any side effects or problems  Follow up with Dr Jenny Reichmann in the next few weeks   Update if not starting to improve in a week or if worsening

## 2014-12-08 ENCOUNTER — Other Ambulatory Visit: Payer: Self-pay | Admitting: Internal Medicine

## 2014-12-08 DIAGNOSIS — N63 Unspecified lump in unspecified breast: Secondary | ICD-10-CM

## 2014-12-08 DIAGNOSIS — N644 Mastodynia: Secondary | ICD-10-CM

## 2014-12-16 ENCOUNTER — Ambulatory Visit
Admission: RE | Admit: 2014-12-16 | Discharge: 2014-12-16 | Disposition: A | Discharge status: Home / Self Care | Payer: BC Managed Care – PPO | Source: Ambulatory Visit | Attending: Internal Medicine | Admitting: Internal Medicine

## 2014-12-16 DIAGNOSIS — N63 Unspecified lump in unspecified breast: Secondary | ICD-10-CM

## 2014-12-16 DIAGNOSIS — N644 Mastodynia: Secondary | ICD-10-CM

## 2014-12-25 ENCOUNTER — Telehealth: Payer: Self-pay | Admitting: Internal Medicine

## 2014-12-25 MED ORDER — ALPRAZOLAM 0.25 MG PO TABS
0.2500 mg | ORAL_TABLET | Freq: Two times a day (BID) | ORAL | Status: DC | PRN
Start: 2014-12-25 — End: 2015-10-01

## 2014-12-25 NOTE — Telephone Encounter (Signed)
Patient's brother has passed away and she is wondering if you can prescribe anything for her anxiety. She states you were able to do this when her father passed.  Please advise

## 2014-12-25 NOTE — Telephone Encounter (Signed)
Done hardcopy to [name] Walker  

## 2014-12-25 NOTE — Telephone Encounter (Signed)
Pt advised, Rx faxed to pharmacy 

## 2014-12-29 ENCOUNTER — Other Ambulatory Visit: Payer: Self-pay | Admitting: Family Medicine

## 2014-12-30 ENCOUNTER — Other Ambulatory Visit: Payer: Self-pay

## 2014-12-30 MED ORDER — AMLODIPINE BESYLATE 5 MG PO TABS: 5.0000 mg | ORAL_TABLET | Freq: Every day | ORAL | Status: DC

## 2014-12-31 ENCOUNTER — Other Ambulatory Visit: Payer: Self-pay | Admitting: Internal Medicine

## 2015-02-10 ENCOUNTER — Other Ambulatory Visit: Payer: Self-pay | Admitting: Internal Medicine

## 2015-02-18 ENCOUNTER — Ambulatory Visit: Payer: BC Managed Care – PPO | Admitting: Internal Medicine

## 2015-02-18 ENCOUNTER — Encounter (HOSPITAL_COMMUNITY): Payer: Self-pay

## 2015-02-18 ENCOUNTER — Emergency Department (HOSPITAL_COMMUNITY)
Admission: EM | Admit: 2015-02-18 | Discharge: 2015-02-18 | Disposition: A | Discharge status: Home / Self Care | Payer: BC Managed Care – PPO | Attending: Emergency Medicine | Admitting: Emergency Medicine

## 2015-02-18 ENCOUNTER — Emergency Department (HOSPITAL_COMMUNITY): Payer: BC Managed Care – PPO

## 2015-02-18 DIAGNOSIS — M545 Low back pain, unspecified: Secondary | ICD-10-CM

## 2015-02-18 DIAGNOSIS — I10 Essential (primary) hypertension: Secondary | ICD-10-CM | POA: Diagnosis not present

## 2015-02-18 DIAGNOSIS — R109 Unspecified abdominal pain: Secondary | ICD-10-CM

## 2015-02-18 DIAGNOSIS — Z79899 Other long term (current) drug therapy: Secondary | ICD-10-CM | POA: Diagnosis not present

## 2015-02-18 DIAGNOSIS — F17210 Nicotine dependence, cigarettes, uncomplicated: Secondary | ICD-10-CM | POA: Diagnosis not present

## 2015-02-18 DIAGNOSIS — K219 Gastro-esophageal reflux disease without esophagitis: Secondary | ICD-10-CM | POA: Diagnosis not present

## 2015-02-18 DIAGNOSIS — R350 Frequency of micturition: Secondary | ICD-10-CM | POA: Diagnosis present

## 2015-02-18 DIAGNOSIS — R10A0 Flank pain, unspecified side: Secondary | ICD-10-CM

## 2015-02-18 LAB — URINE MICROSCOPIC-ADD ON

## 2015-02-18 LAB — URINALYSIS, ROUTINE W REFLEX MICROSCOPIC
Bilirubin Urine: NEGATIVE
Glucose, UA: NEGATIVE mg/dL
Hgb urine dipstick: NEGATIVE
Ketones, ur: NEGATIVE mg/dL
Nitrite: NEGATIVE
Protein, ur: NEGATIVE mg/dL
Specific Gravity, Urine: 1.018 (ref 1.005–1.030)
pH: 6 (ref 5.0–8.0)

## 2015-02-18 MED ORDER — HYDROCODONE-ACETAMINOPHEN 5-325 MG PO TABS: 1.0000 | ORAL_TABLET | Freq: Four times a day (QID) | ORAL | Status: DC | PRN

## 2015-02-18 MED ORDER — HYDROCODONE-ACETAMINOPHEN 5-325 MG PO TABS
1.0000 | ORAL_TABLET | Freq: Once | ORAL | Status: AC
  Administered 2015-02-18: 1 via ORAL
  Filled 2015-02-18: qty 1

## 2015-02-18 MED ORDER — CYCLOBENZAPRINE HCL 10 MG PO TABS: 10.0000 mg | ORAL_TABLET | Freq: Two times a day (BID) | ORAL | Status: DC | PRN

## 2015-02-18 MED ORDER — NAPROXEN 500 MG PO TABS: 500.0000 mg | ORAL_TABLET | Freq: Two times a day (BID) | ORAL | Status: DC

## 2015-02-18 NOTE — ED Notes (Signed)
Pt verbalized understanding of d/c instructions, prescriptions, and follow-up care. No further questions/concerns, VSS, assisted to lobby in wheelchair.  

## 2015-02-18 NOTE — ED Notes (Signed)
Pt returned from CT °

## 2015-02-18 NOTE — Discharge Instructions (Signed)
Workup negative for urinary tract infection also no evidence of any ureteral stones. Most likely this is musculoskeletal back problem may be some early sciatica occurring intermittently on the right side. Take medications as directed. If symptoms persist for a week follow-up with your back doctor.

## 2015-02-18 NOTE — ED Notes (Addendum)
Pt c/o lower right back pain x 1 week. Pt reports increased urinary frequency as well. Pt hx back surgery and kidney stones and states this pain is similar to both.

## 2015-02-18 NOTE — ED Provider Notes (Signed)
CSN: [id number]     Arrival date & time 02/18/15  [id number] History   First MD Initiated Contact with Patient 02/18/15 1003     Chief Complaint  Patient presents with  . Urinary Frequency  . Back Pain     (Consider location/radiation/quality/duration/timing/severity/associated sxs/prior Treatment) Patient is a 54 y.o. female presenting with frequency and back pain. The history is provided by the patient.  Urinary Frequency Pertinent negatives include no chest pain, no abdominal pain, no headaches and no shortness of breath.  Back Pain Associated symptoms: dysuria   Associated symptoms: no abdominal pain, no chest pain, no fever, no headaches, no numbness and no weakness    patient with one-week complaint of right lower back pain that goes to the right flank and also radiates over to the left lower back. Associated with some intermittent pain shooting down the back of the right leg but none of that now. No numbness or weakness to the right foot. No incontinence. Patient feels that she may have a urinary tract infection or a kidney stone as this with the pain feels like. Patient's also had the back surgery in the past for herniated disc and is followed by neurosurgery. Patient states that the pain is 5 out of 10. Sharp ache in nature.  Past Medical History  Diagnosis Date  . ACHILLES TENDINITIS   . ALLERGIC RHINITIS   . GERD   . HYPERTENSION   . LATERAL EPICONDYLITIS, RIGHT   . LOW BACK PAIN   . Plantar fascial fibromatosis   . SUBACROMIAL BURSITIS, RIGHT    Past Surgical History  Procedure Laterality Date  . Tubal ligation    . Kidney surgery     Family History  Problem Relation Age of Onset  . Cancer Sister     colon cancer  . Arthritis Other   . Diabetes Other   . Hypertension Other   . Cancer Other     breast cancer and prostate cancer  . Stroke Other    Social History  Substance Use Topics  . Smoking status: Current Every Day Smoker -- 1.00 packs/day    Types:  Cigarettes  . Smokeless tobacco: None  . Alcohol Use: No   OB History    No data available     Review of Systems  Constitutional: Negative for fever.  HENT: Negative for congestion.   Eyes: Negative for redness.  Respiratory: Negative for shortness of breath.   Cardiovascular: Negative for chest pain.  Gastrointestinal: Negative for abdominal pain.  Genitourinary: Positive for dysuria, frequency and hematuria.  Musculoskeletal: Positive for back pain.  Skin: Negative for rash.  Neurological: Negative for weakness, numbness and headaches.  Hematological: Does not bruise/bleed easily.  Psychiatric/Behavioral: Negative for behavioral problems.      Allergies  Other and Lisinopril  Home Medications   Prior to Admission medications   Medication Sig Start Date End Date Taking? Authorizing Provider  albuterol (PROVENTIL HFA;VENTOLIN HFA) 108 (90 BASE) MCG/ACT inhaler Inhale 2 puffs into the lungs every 6 (six) hours as needed for wheezing or shortness of breath. 12/05/14  Yes Abner Greenspan, MD  ALPRAZolam Duanne Moron) 0.25 MG tablet Take 1 tablet (0.25 mg total) by mouth 2 (two) times daily as needed for anxiety. 12/25/14  Yes Biagio Borg, MD  amLODipine (NORVASC) 5 MG tablet Take 1 tablet (5 mg total) by mouth daily. 12/30/14  Yes Biagio Borg, MD  diphenhydrAMINE (BENADRYL) 25 MG tablet Take 1 tablet (25 mg total) by mouth  every 6 (six) hours as needed. 10/15/13  Yes Tatyana Kirichenko, PA-C  ibuprofen (ADVIL,MOTRIN) 800 MG tablet TAKE ONE TABLET BY MOUTH EVERY 8 HOURS AS NEEDED FOR PAIN 02/11/15  Yes Biagio Borg, MD  pantoprazole (PROTONIX) 40 MG tablet Take 1 tablet (40 mg total) by mouth daily. 05/18/14  Yes Biagio Borg, MD  sodium chloride (OCEAN) 0.65 % SOLN nasal spray Place 1-2 sprays into both nostrils daily as needed for congestion.   Yes Historical Provider, MD  cyclobenzaprine (FLEXERIL) 10 MG tablet Take 1 tablet (10 mg total) by mouth 2 (two) times daily as needed for muscle  spasms. 02/18/15   Fredia Sorrow, MD  hydrochlorothiazide (HYDRODIURIL) 25 MG tablet Take 1 tablet (25 mg total) by mouth daily. Patient not taking: Reported on 12/05/2014 03/01/14   Virgel Manifold, MD  HYDROcodone-acetaminophen (NORCO/VICODIN) 5-325 MG tablet Take 1-2 tablets by mouth every 6 (six) hours as needed for moderate pain. 02/18/15   Fredia Sorrow, MD  naproxen (NAPROSYN) 500 MG tablet Take 1 tablet (500 mg total) by mouth 2 (two) times daily. 02/18/15   Fredia Sorrow, MD   BP 144/105 mmHg  Pulse 70  Temp(Src) 98.1 F (36.7 C) (Oral)  Resp 18  SpO2 100%  LMP 08/22/2010 Physical Exam  Constitutional: She is oriented to person, place, and time. She appears well-developed and well-nourished. No distress.  HENT:  Head: Normocephalic and atraumatic.  Mouth/Throat: Oropharynx is clear and moist.  Eyes: Conjunctivae and EOM are normal. Pupils are equal, round, and reactive to light.  Neck: Normal range of motion. Neck supple.  Cardiovascular: Normal rate, regular rhythm and normal heart sounds.   No murmur heard. Pulmonary/Chest: Effort normal and breath sounds normal. No respiratory distress.  Abdominal: Soft. Bowel sounds are normal. There is no tenderness.  Musculoskeletal: Normal range of motion. She exhibits no edema or tenderness.  Neurological: She is alert and oriented to person, place, and time. No cranial nerve deficit. She exhibits normal muscle tone. Coordination normal.  Skin: Skin is warm. No rash noted.  Nursing note and vitals reviewed.   ED Course  Procedures (including critical care time) Labs Review Labs Reviewed  URINALYSIS, ROUTINE W REFLEX MICROSCOPIC (NOT AT [facility]) - Abnormal; Notable for the following:    Leukocytes, UA TRACE (*)    All other components within normal limits  URINE MICROSCOPIC-ADD ON - Abnormal; Notable for the following:    Squamous Epithelial / LPF 0-5 (*)    Bacteria, UA RARE (*)    All other components within normal limits     Imaging Review Ct Renal Stone Study  02/18/2015  CLINICAL DATA:  Flank pain EXAM: CT ABDOMEN AND PELVIS WITHOUT CONTRAST TECHNIQUE: Multidetector CT imaging of the abdomen and pelvis was performed following the standard protocol without IV contrast. COMPARISON:  11/27/2009 FINDINGS: The lung bases are unremarkable. Single emphysematous bulla is noted right base laterally. Sagittal images of the spine shows significant disc space flattening with vacuum disc phenomenon and mild anterior spurring at L5-S1 level. Unenhanced liver shows no biliary ductal dilatation. No calcified gallstones are noted within gallbladder. Atherosclerotic calcifications are noted abdominal aorta and iliac arteries. Atherosclerotic calcification at the origin of left renal artery. Unenhanced pancreas spleen and adrenal glands are unremarkable. Unenhanced kidneys shows no nephrolithiasis. There is cortical scarring in midpole of the right kidney. At least 2 nonobstructive calculi are noted in upper pole of the right kidney the largest measures 3.2 mm. Nonobstructive calculus midpole of the right kidney measures  3.6 mm. Nonobstructive calcified calculus in lower pole of the left kidney measures 2.9 mm. No hydronephrosis or hydroureter. Bilateral no calcified ureteral calculi. No aortic aneurysm. No small bowel obstruction.  No ascites or free air.  No adenopathy. There is no pericecal inflammation. The terminal ileum is unremarkable. Normal appendix is partially visualized in coronal image 60. Moderate stool noted in right colon and transverse colon. There is a redundant proximal transverse colon looping in right lower abdomen. Colonic diverticula are noted in descending colon. Few diverticula are noted proximal sigmoid colon. There is no evidence of acute diverticulitis. No distal colonic obstruction. Unenhanced uterus and ovaries are unremarkable. The urinary bladder is under distended. Bilateral distal ureter is unremarkable. Few  colonic diverticula are noted right colon and proximal transverse colon. IMPRESSION: 1. There is bilateral nonobstructive nephrolithiasis. No hydronephrosis or hydroureter. No calcified ureteral calculi are noted. 2. No pericecal inflammation.  Normal appendix. 3. Moderate stool noted in right colon and transverse colon. Redundant transverse colon. Colonic diverticulosis without evidence of acute diverticulitis. 4. No small bowel obstruction. 5. Degenerative changes lumbar spine at L5-S1 level. Electronically Signed   By: Lahoma Crocker M.D.   On: 02/18/2015 12:22   I have personally reviewed and evaluated these images and lab results as part of my medical decision-making.   EKG Interpretation None      MDM   Final diagnoses:  Flank pain  Right-sided low back pain without sciatica    Patient with one-week history of right-sided low back pain with some right flank pain. The back pain does radiate over to the left. Patient's had some intermittent pain shooting down the back of the right leg but none now. No numbness no weakness. Patient was concerned she may have a urinary tract infection or a kidney stone because this reminds her of that. Workup for both of those was negative. Feel it is mostly muscle skeletal back pain. Patient will be treated symptomatically. Patient's had the disc surgery in the past and does have a neurosurgeon to follow-up with.    Fredia Sorrow, MD 02/18/15 [phone]

## 2015-05-29 ENCOUNTER — Other Ambulatory Visit: Payer: Self-pay | Admitting: Internal Medicine

## 2015-06-30 ENCOUNTER — Other Ambulatory Visit: Payer: Self-pay | Admitting: Internal Medicine

## 2015-08-09 ENCOUNTER — Other Ambulatory Visit: Payer: Self-pay | Admitting: Internal Medicine

## 2015-09-24 ENCOUNTER — Telehealth: Payer: Self-pay | Admitting: Internal Medicine

## 2015-09-24 NOTE — Telephone Encounter (Signed)
Patient is out of amlodipine.  She has appt for CPE on 8/4.  Can we send refill of this med to [facility] on [address]

## 2015-09-27 MED ORDER — AMLODIPINE BESYLATE 5 MG PO TABS: 5.0000 mg | ORAL_TABLET | Freq: Every day | ORAL | 1 refills | Status: DC

## 2015-09-27 NOTE — Telephone Encounter (Signed)
Medication refill sent to pharmacy  

## 2015-10-01 ENCOUNTER — Other Ambulatory Visit (INDEPENDENT_AMBULATORY_CARE_PROVIDER_SITE_OTHER): Payer: BC Managed Care – PPO

## 2015-10-01 ENCOUNTER — Other Ambulatory Visit: Payer: Self-pay | Admitting: Internal Medicine

## 2015-10-01 ENCOUNTER — Encounter: Payer: Self-pay | Admitting: Internal Medicine

## 2015-10-01 ENCOUNTER — Ambulatory Visit (INDEPENDENT_AMBULATORY_CARE_PROVIDER_SITE_OTHER): Payer: BC Managed Care – PPO | Admitting: Internal Medicine

## 2015-10-01 VITALS — BP 146/88 | HR 84 | Temp 98.3°F | Ht 61.0 in | Wt 147.0 lb

## 2015-10-01 DIAGNOSIS — R7989 Other specified abnormal findings of blood chemistry: Secondary | ICD-10-CM

## 2015-10-01 DIAGNOSIS — Z1159 Encounter for screening for other viral diseases: Secondary | ICD-10-CM

## 2015-10-01 DIAGNOSIS — I10 Essential (primary) hypertension: Secondary | ICD-10-CM | POA: Diagnosis not present

## 2015-10-01 DIAGNOSIS — M545 Low back pain: Secondary | ICD-10-CM

## 2015-10-01 DIAGNOSIS — L309 Dermatitis, unspecified: Secondary | ICD-10-CM | POA: Diagnosis not present

## 2015-10-01 DIAGNOSIS — Z0001 Encounter for general adult medical examination with abnormal findings: Secondary | ICD-10-CM | POA: Diagnosis not present

## 2015-10-01 DIAGNOSIS — R609 Edema, unspecified: Secondary | ICD-10-CM | POA: Diagnosis not present

## 2015-10-01 DIAGNOSIS — R6889 Other general symptoms and signs: Secondary | ICD-10-CM | POA: Diagnosis not present

## 2015-10-01 DIAGNOSIS — M419 Scoliosis, unspecified: Secondary | ICD-10-CM

## 2015-10-01 DIAGNOSIS — J4520 Mild intermittent asthma, uncomplicated: Secondary | ICD-10-CM

## 2015-10-01 DIAGNOSIS — R600 Localized edema: Secondary | ICD-10-CM | POA: Insufficient documentation

## 2015-10-01 DIAGNOSIS — G8929 Other chronic pain: Secondary | ICD-10-CM

## 2015-10-01 DIAGNOSIS — J45909 Unspecified asthma, uncomplicated: Secondary | ICD-10-CM | POA: Insufficient documentation

## 2015-10-01 HISTORY — DX: Scoliosis, unspecified: M41.9

## 2015-10-01 LAB — CBC WITH DIFFERENTIAL/PLATELET
Basophils Absolute: 0 10*3/uL (ref 0.0–0.1)
Basophils Relative: 0.5 % (ref 0.0–3.0)
Eosinophils Absolute: 0.1 10*3/uL (ref 0.0–0.7)
Eosinophils Relative: 1.8 % (ref 0.0–5.0)
HCT: 42.7 % (ref 36.0–46.0)
Hemoglobin: 14.2 g/dL (ref 12.0–15.0)
Lymphocytes Relative: 44.3 % (ref 12.0–46.0)
Lymphs Abs: 3.5 10*3/uL (ref 0.7–4.0)
MCHC: 33.2 g/dL (ref 30.0–36.0)
MCV: 84.8 fl (ref 78.0–100.0)
Monocytes Absolute: 0.7 10*3/uL (ref 0.1–1.0)
Monocytes Relative: 8.5 % (ref 3.0–12.0)
Neutro Abs: 3.5 10*3/uL (ref 1.4–7.7)
Neutrophils Relative %: 44.9 % (ref 43.0–77.0)
Platelets: 163 10*3/uL (ref 150.0–400.0)
RBC: 5.04 Mil/uL (ref 3.87–5.11)
RDW: 14.6 % (ref 11.5–15.5)
WBC: 7.8 10*3/uL (ref 4.0–10.5)

## 2015-10-01 LAB — BASIC METABOLIC PANEL
BUN: 10 mg/dL (ref 6–23)
CO2: 27 mEq/L (ref 19–32)
Calcium: 9.6 mg/dL (ref 8.4–10.5)
Chloride: 106 mEq/L (ref 96–112)
Creatinine, Ser: 0.63 mg/dL (ref 0.40–1.20)
GFR: 125.83 mL/min (ref >60.00)
Glucose, Bld: 82 mg/dL (ref 70–99)
Potassium: 2.9 mEq/L — ABNORMAL LOW (ref 3.5–5.1)
Sodium: 142 mEq/L (ref 135–145)

## 2015-10-01 LAB — URINALYSIS, ROUTINE W REFLEX MICROSCOPIC
Bilirubin Urine: NEGATIVE
Ketones, ur: NEGATIVE
Leukocytes, UA: NEGATIVE
Nitrite: NEGATIVE
Specific Gravity, Urine: 1.01 (ref 1.000–1.030)
Total Protein, Urine: NEGATIVE
Urine Glucose: NEGATIVE
Urobilinogen, UA: 0.2 (ref 0.0–1.0)
pH: 6.5 (ref 5.0–8.0)

## 2015-10-01 LAB — LIPID PANEL
Cholesterol: 187 mg/dL (ref 0–200)
HDL: 39 mg/dL — ABNORMAL LOW (ref >39.00)
NonHDL: 148.49
Total CHOL/HDL Ratio: 5
Triglycerides: 370 mg/dL — ABNORMAL HIGH (ref 0.0–149.0)
VLDL: 74 mg/dL — ABNORMAL HIGH (ref 0.0–40.0)

## 2015-10-01 LAB — HEPATIC FUNCTION PANEL
ALT: 21 U/L (ref 0–35)
AST: 19 U/L (ref 0–37)
Albumin: 4.4 g/dL (ref 3.5–5.2)
Alkaline Phosphatase: 131 U/L — ABNORMAL HIGH (ref 39–117)
Bilirubin, Direct: 0 mg/dL (ref 0.0–0.3)
Total Bilirubin: 0.4 mg/dL (ref 0.2–1.2)
Total Protein: 7.5 g/dL (ref 6.0–8.3)

## 2015-10-01 LAB — TSH: TSH: 0.58 u[IU]/mL (ref 0.35–4.50)

## 2015-10-01 LAB — LDL CHOLESTEROL, DIRECT: Direct LDL: 97 mg/dL

## 2015-10-01 MED ORDER — ALBUTEROL SULFATE HFA 108 (90 BASE) MCG/ACT IN AERS: 2.0000 | INHALATION_SPRAY | Freq: Four times a day (QID) | RESPIRATORY_TRACT | 11 refills | Status: DC | PRN

## 2015-10-01 MED ORDER — AMLODIPINE BESYLATE 5 MG PO TABS: 5.0000 mg | ORAL_TABLET | Freq: Every day | ORAL | 3 refills | Status: DC

## 2015-10-01 MED ORDER — HYDROCHLOROTHIAZIDE 25 MG PO TABS: 25.0000 mg | ORAL_TABLET | Freq: Every day | ORAL | 3 refills | Status: DC

## 2015-10-01 MED ORDER — POTASSIUM CHLORIDE ER 10 MEQ PO TBCR: 10.0000 meq | EXTENDED_RELEASE_TABLET | Freq: Every day | ORAL | 3 refills | Status: DC

## 2015-10-01 MED ORDER — DESONIDE 0.05 % EX CREA
TOPICAL_CREAM | Freq: Two times a day (BID) | CUTANEOUS | 2 refills | Status: DC
Start: 2015-10-01 — End: 2017-11-06

## 2015-10-01 MED ORDER — PANTOPRAZOLE SODIUM 40 MG PO TBEC: 40.0000 mg | DELAYED_RELEASE_TABLET | Freq: Every day | ORAL | 3 refills | Status: DC

## 2015-10-01 MED ORDER — ALPRAZOLAM 0.25 MG PO TABS: 0.2500 mg | ORAL_TABLET | Freq: Two times a day (BID) | ORAL | 1 refills | Status: DC | PRN

## 2015-10-01 MED ORDER — NAPROXEN 500 MG PO TABS: 500.0000 mg | ORAL_TABLET | Freq: Two times a day (BID) | ORAL | 5 refills | Status: DC

## 2015-10-01 MED ORDER — CYCLOBENZAPRINE HCL 10 MG PO TABS: 10.0000 mg | ORAL_TABLET | Freq: Three times a day (TID) | ORAL | 1 refills | Status: DC | PRN

## 2015-10-01 MED ORDER — HYDROCODONE-ACETAMINOPHEN 5-325 MG PO TABS: ORAL_TABLET | ORAL | 0 refills | Status: DC

## 2015-10-01 NOTE — Progress Notes (Signed)
Pre visit review using our clinic review tool, if applicable. No additional management support is needed unless otherwise documented below in the visit note. 

## 2015-10-01 NOTE — Patient Instructions (Signed)
Please take all new medication as prescribed - the steroid cream  Please continue all other medications as before, and refills have been done if requested, including the xanax, vicodin, fluid pill, inhaler, and blood pressure pills  Please have the pharmacy call with any other refills you may need.  Please continue your efforts at being more active, low cholesterol diet, and weight control.  You are otherwise up to date with prevention measures today.  Please keep your appointments with your specialists as you may have planned  Please go to the LAB in the Basement (turn left off the elevator) for the tests to be done today  You will be contacted by phone if any changes need to be made immediately.  Otherwise, you will receive a letter about your results with an explanation, but please check with MyChart first.  Please remember to sign up for MyChart if you have not done so, as this will be important to you in the future with finding out test results, communicating by private email, and scheduling acute appointments online when needed.  Please return in 6 months, or sooner if needed

## 2015-10-01 NOTE — Progress Notes (Signed)
Subjective:    Patient ID: [name] Walker, female    DOB: 1960-04-13, 55 y.o.   MRN: [id number]  HPI  Here for wellness and f/u;  Overall doing ok;   Pt denies neurological change such as new headache, facial or extremity weakness.  Pt denies polydipsia, polyuria, or low sugar symptoms. Pt states overall good compliance with treatment and medications, good tolerability, and has been trying to follow appropriate diet.  Pt denies worsening depressive symptoms, suicidal ideation or panic. No fever, night sweats, wt loss, loss of appetite, or other constitutional symptoms.  Pt states good ability with ADL's, has low fall risk, home safety reviewed and adequate, no other significant changes in hearing or vision, and only occasionally active with exercise.  Pt continues to have recurring LBP without change in severity, bowel or bladder change, fever, wt loss,  worsening LE pain/numbness/weakness, gait change or falls, s/p MRi 2014 with subsequent surgury, still with significant pain, still hurts at 30 min standing, hard to even do housework.  Pt denies chest pain, increased sob or doe, wheezing, orthopnea, PND, increased LE swelling, palpitations, dizziness or syncope, except for mild pedal edema after out of hct recently.  Has ongoing eczema with flare recently to mult areas including the right axilla with marked itching just cant stop smoking.  Does have several wks ongoing nasal allergy symptoms with clearish congestion, itch and sneezing, without fever, pain, ST, cough, swelling or wheezing, but needs inhaler refill as well. Past Medical History:  Diagnosis Date  . ACHILLES TENDINITIS   . ALLERGIC RHINITIS   . GERD   . HYPERTENSION   . LATERAL EPICONDYLITIS, RIGHT   . LOW BACK PAIN   . Plantar fascial fibromatosis   . SUBACROMIAL BURSITIS, RIGHT   . Thoracic scoliosis 10/01/2015   Past Surgical History:  Procedure Laterality Date  . KIDNEY SURGERY    . TUBAL LIGATION      reports that she has  been smoking Cigarettes.  She has been smoking about 1.00 pack per day. She does not have any smokeless tobacco history on file. She reports that she does not drink alcohol. Her drug history is not on file. family history includes Arthritis in her other; Cancer in her other and sister; Diabetes in her other; Hypertension in her other; Stroke in her other. Allergies  Allergen Reactions  . Other Palpitations    ALL MUSCLE RELAXERS-PER PATIENT  . Lisinopril Other (See Comments)    ? Possible tongue swelling    Current Outpatient Prescriptions on File Prior to Visit  Medication Sig Dispense Refill  . sodium chloride (OCEAN) 0.65 % SOLN nasal spray Place 1-2 sprays into both nostrils daily as needed for congestion.     No current facility-administered medications on file prior to visit.    Review of Systems Constitutional: Negative for increased diaphoresis, or other activity, appetite or siginficant weight change other than noted HENT: Negative for worsening hearing loss, ear pain, facial swelling, mouth sores and neck stiffness.   Eyes: Negative for other worsening pain, redness or visual disturbance.  Respiratory: Negative for choking or stridor Cardiovascular: Negative for other chest pain and palpitations.  Gastrointestinal: Negative for worsening diarrhea, blood in stool, or abdominal distention Genitourinary: Negative for hematuria, flank pain or change in urine volume.  Musculoskeletal: Negative for myalgias or other joint complaints.  Skin: Negative for other color change and wound or drainage.  Neurological: Negative for syncope and numbness. other than noted Hematological: Negative for adenopathy. or  other swelling Psychiatric/Behavioral: Negative for hallucinations, SI, self-injury, decreased concentration or other worsening agitation.      Objective:   Physical Exam BP (!) 146/88 (BP Location: Right Arm, Patient Position: Sitting, Cuff Size: Normal)   Pulse 84   Temp 98.3 F  (36.8 C) (Oral)   Ht 5\' 1"  (1.549 m)   Wt 147 lb (66.7 kg)   LMP 08/22/2010   SpO2 96%   BMI 27.78 kg/m  VS noted,  Constitutional: Pt is oriented to person, place, and time. Appears well-developed and well-nourished, in no significant distress Head: Normocephalic and atraumatic  Eyes: Conjunctivae and EOM are normal. Pupils are equal, round, and reactive to light Right Ear: External ear normal.  Left Ear: External ear normal Nose: Nose normal.  Mouth/Throat: Oropharynx is clear and moist  Bilat tm's with mild erythema.  Max sinus areas non tender.  Pharynx with mild erythema, no exudate Neck: Normal range of motion. Neck supple. No JVD present. No tracheal deviation present or significant neck LA or mass Cardiovascular: Normal rate, regular rhythm, normal heart sounds and intact distal pulses.   Pulmonary/Chest: Effort normal and breath sounds without rales or wheezing  Abdominal: Soft. Bowel sounds are normal. NT. No HSM  Musculoskeletal: Normal range of motion. Exhibits with trace bilat pedal edema Lymphadenopathy: Has no cervical adenopathy.  Neurological: Pt is alert and oriented to person, place, and time. Pt has normal reflexes. No cranial nerve deficit. Motor grossly intact Skin: Skin is warm and dry. + eczemaous rash to right axilla noted, no new ulcers Psychiatric:  Has normal mood and affect. Behavior is normal.  Spine: tender midline lowest lumbar, without swelling, or rash Lab Results  Component Value Date   WBC 5.4 03/01/2014   HGB 14.9 03/01/2014   HCT 44.5 03/01/2014   PLT 133 (L) 03/01/2014   GLUCOSE 95 03/01/2014   CHOL 171 10/01/2013   TRIG (H) 10/01/2013    418.0 Triglyceride is over 400; calculations on Lipids are invalid.   HDL 31.40 (L) 10/01/2013   LDLDIRECT 104.3 10/01/2013   LDLCALC 85 07/02/2012   ALT 24 10/01/2013   AST 26 10/01/2013   NA 141 03/01/2014   K 3.4 (L) 03/01/2014   CL 111 03/01/2014   CREATININE 0.50 03/01/2014   BUN 14 03/01/2014    CO2 26 03/01/2014   TSH 0.38 10/01/2013   INR 0.98 11/27/2009  Most recent CT stone protocol IMPRESSION: 1. There is bilateral nonobstructive nephrolithiasis. No hydronephrosis or hydroureter. No calcified ureteral calculi are noted. 2. No pericecal inflammation.  Normal appendix. 3. Moderate stool noted in right colon and transverse colon. Redundant transverse colon. Colonic diverticulosis without evidence of acute diverticulitis. 4. No small bowel obstruction. 5. Degenerative changes lumbar spine at L5-S1 level.   Electronically Signed   By: Lahoma Crocker M.D.   On: 02/18/2015 12:22  Most recent MRi Jul 25, 2012 IMPRESSION: Left paracentral disc protrusion   L5-S1 with impingement of the left S1 nerve root.  In addition, there is narrowing of the left foramen at L5-S1.   Original Report Authenticated By: Carl Best, M.D.    Assessment & Plan:

## 2015-10-02 LAB — HEPATITIS C ANTIBODY: HCV Ab: NEGATIVE

## 2015-10-02 NOTE — Assessment & Plan Note (Addendum)
Mild uncontrolled, to add/restart hct asd,  to f/u any worsening symptoms or concerns  In addition to the time spent performing CPE, I spent an additional 25 minutes face to face,in which greater than 50% of this time was spent in counseling and coordination of care for patient's acute illness as documented.

## 2015-10-02 NOTE — Assessment & Plan Note (Signed)
Mild to mod, for desonide asd,  to f/u any worsening symptoms or concerns

## 2015-10-02 NOTE — Assessment & Plan Note (Signed)
Stable, for inhaler refill,  to f/u any worsening symptoms or concerns 

## 2015-10-02 NOTE — Assessment & Plan Note (Signed)
For naproxyn bid prn, also vicodin 1 qd prn only for breakthrough pain

## 2015-10-02 NOTE — Assessment & Plan Note (Signed)

## 2015-10-02 NOTE — Assessment & Plan Note (Signed)
To start HCT as above,  to f/u any worsening symptoms or concerns

## 2015-10-08 ENCOUNTER — Emergency Department (HOSPITAL_COMMUNITY)
Admission: EM | Admit: 2015-10-08 | Discharge: 2015-10-08 | Disposition: A | Discharge status: Home / Self Care | Payer: BC Managed Care – PPO | Attending: Emergency Medicine | Admitting: Emergency Medicine

## 2015-10-08 ENCOUNTER — Encounter (HOSPITAL_COMMUNITY): Payer: Self-pay | Admitting: Emergency Medicine

## 2015-10-08 ENCOUNTER — Telehealth: Payer: Self-pay | Admitting: Internal Medicine

## 2015-10-08 DIAGNOSIS — I10 Essential (primary) hypertension: Secondary | ICD-10-CM | POA: Insufficient documentation

## 2015-10-08 DIAGNOSIS — M7989 Other specified soft tissue disorders: Secondary | ICD-10-CM | POA: Insufficient documentation

## 2015-10-08 DIAGNOSIS — R0600 Dyspnea, unspecified: Secondary | ICD-10-CM | POA: Insufficient documentation

## 2015-10-08 DIAGNOSIS — F17210 Nicotine dependence, cigarettes, uncomplicated: Secondary | ICD-10-CM | POA: Insufficient documentation

## 2015-10-08 DIAGNOSIS — Z7951 Long term (current) use of inhaled steroids: Secondary | ICD-10-CM | POA: Diagnosis not present

## 2015-10-08 DIAGNOSIS — Z79899 Other long term (current) drug therapy: Secondary | ICD-10-CM | POA: Diagnosis not present

## 2015-10-08 DIAGNOSIS — R42 Dizziness and giddiness: Secondary | ICD-10-CM | POA: Insufficient documentation

## 2015-10-08 DIAGNOSIS — Z791 Long term (current) use of non-steroidal anti-inflammatories (NSAID): Secondary | ICD-10-CM | POA: Insufficient documentation

## 2015-10-08 DIAGNOSIS — J45909 Unspecified asthma, uncomplicated: Secondary | ICD-10-CM | POA: Diagnosis not present

## 2015-10-08 DIAGNOSIS — R197 Diarrhea, unspecified: Secondary | ICD-10-CM | POA: Insufficient documentation

## 2015-10-08 DIAGNOSIS — M549 Dorsalgia, unspecified: Secondary | ICD-10-CM | POA: Diagnosis not present

## 2015-10-08 DIAGNOSIS — Z79891 Long term (current) use of opiate analgesic: Secondary | ICD-10-CM | POA: Insufficient documentation

## 2015-10-08 DIAGNOSIS — T783XXA Angioneurotic edema, initial encounter: Secondary | ICD-10-CM | POA: Diagnosis not present

## 2015-10-08 DIAGNOSIS — R600 Localized edema: Secondary | ICD-10-CM | POA: Diagnosis present

## 2015-10-08 LAB — BASIC METABOLIC PANEL
Anion gap: 7 (ref 5–15)
BUN: 10 mg/dL (ref 6–20)
CO2: 24 mmol/L (ref 22–32)
Calcium: 9.2 mg/dL (ref 8.9–10.3)
Chloride: 110 mmol/L (ref 101–111)
Creatinine, Ser: 0.6 mg/dL (ref 0.44–1.00)
GFR calc Af Amer: >60 mL/min (ref >60)
GFR calc non Af Amer: >60 mL/min (ref >60)
Glucose, Bld: 112 mg/dL — ABNORMAL HIGH (ref 65–99)
Potassium: 3.7 mmol/L (ref 3.5–5.1)
Sodium: 141 mmol/L (ref 135–145)

## 2015-10-08 LAB — URINALYSIS, ROUTINE W REFLEX MICROSCOPIC
Bilirubin Urine: NEGATIVE
Glucose, UA: NEGATIVE mg/dL
Hgb urine dipstick: NEGATIVE
Ketones, ur: NEGATIVE mg/dL
Leukocytes, UA: NEGATIVE
Nitrite: NEGATIVE
Protein, ur: NEGATIVE mg/dL
Specific Gravity, Urine: 1.003 — ABNORMAL LOW (ref 1.005–1.030)
pH: 6.5 (ref 5.0–8.0)

## 2015-10-08 LAB — CBC
HCT: 41.8 % (ref 36.0–46.0)
Hemoglobin: 14 g/dL (ref 12.0–15.0)
MCH: 28.5 pg (ref 26.0–34.0)
MCHC: 33.5 g/dL (ref 30.0–36.0)
MCV: 85.1 fL (ref 78.0–100.0)
Platelets: 145 10*3/uL — ABNORMAL LOW (ref 150–400)
RBC: 4.91 MIL/uL (ref 3.87–5.11)
RDW: 14.4 % (ref 11.5–15.5)
WBC: 6 10*3/uL (ref 4.0–10.5)

## 2015-10-08 LAB — CBG MONITORING, ED: Glucose-Capillary: 130 mg/dL — ABNORMAL HIGH (ref 65–99)

## 2015-10-08 LAB — BRAIN NATRIURETIC PEPTIDE: B Natriuretic Peptide: 20.1 pg/mL (ref 0.0–100.0)

## 2015-10-08 LAB — MAGNESIUM: Magnesium: 2 mg/dL (ref 1.7–2.4)

## 2015-10-08 LAB — I-STAT TROPONIN, ED: Troponin i, poc: 0 ng/mL (ref 0.00–0.08)

## 2015-10-08 MED ORDER — IBUPROFEN 200 MG PO TABS
600.0000 mg | ORAL_TABLET | Freq: Once | ORAL | Status: AC
  Administered 2015-10-08: 600 mg via ORAL
  Filled 2015-10-08: qty 3

## 2015-10-08 MED ORDER — DIPHENHYDRAMINE HCL 50 MG/ML IJ SOLN
25.0000 mg | Freq: Once | INTRAMUSCULAR | Status: AC
  Administered 2015-10-08: 25 mg via INTRAVENOUS
  Filled 2015-10-08: qty 1

## 2015-10-08 MED ORDER — FAMOTIDINE IN NACL 20-0.9 MG/50ML-% IV SOLN
20.0000 mg | Freq: Once | INTRAVENOUS | Status: AC
  Administered 2015-10-08: 20 mg via INTRAVENOUS
  Filled 2015-10-08: qty 50

## 2015-10-08 MED ORDER — PREDNISONE 20 MG PO TABS
ORAL_TABLET | ORAL | 0 refills | Status: DC
Start: 2015-10-08 — End: 2015-11-11

## 2015-10-08 MED ORDER — METHYLPREDNISOLONE SODIUM SUCC 125 MG IJ SOLR
125.0000 mg | Freq: Once | INTRAMUSCULAR | Status: AC
  Administered 2015-10-08: 125 mg via INTRAVENOUS
  Filled 2015-10-08: qty 2

## 2015-10-08 NOTE — Telephone Encounter (Signed)
Patient Name: [name] Walker DOB: 22-Apr-1960 Initial Comment Caller states tongue has been swelling from meds for muscle spasms in back; having difficulty swallowing; dry mouth; Nurse Assessment Nurse: Vallery Sa, RN, Cathy Date/Time (Eastern Time): 10/08/2015 2:29:22 PM Confirm and document reason for call. If symptomatic, describe symptoms. You must click the next button to save text entered. ---Caller states she developed swelling of her tongue and dryness of mouth about two days ago. No severe breathing or swallowing difficulty. No wheezing. No fever. Alert and responsive. Has the patient traveled out of the country within the last 30 days? ---No Does the patient have any new or worsening symptoms? ---Yes Will a triage be completed? ---Yes Related visit to physician within the last 2 weeks? ---Yes Does the PT have any chronic conditions? (i.e. diabetes, asthma, etc.) ---Yes List chronic conditions. ---Started taking unknown medication about 6 days ago for back spasms, Back surgery about 2 years ago, Anxiety, high Blood Pressure, Asthma (no wheezing or breathing difficulty) Is this a behavioral health or substance abuse call? ---No Guidelines Guideline Title Affirmed Question Affirmed Notes Tongue Swelling All other adults with swollen tongue (Exception: tongue swelling is a recurrent problem AND NO swelling at present) Final Disposition User Go to ED Now Vallery Sa, RN, [employer] will call the office back to follow up on Rx for Inbuprofen 800mg . Referrals [facility] - ED Disagree/Comply: Comply

## 2015-10-08 NOTE — ED Triage Notes (Signed)
Pt complaint of tongue swelling for two days post start of K on Monday for K of 2.9. No airway obstruction noted. Pt ambulatory and speaking full sentences.

## 2015-10-08 NOTE — ED Notes (Signed)
Patient given water per verbal from EDP, [place].

## 2015-10-08 NOTE — ED Notes (Signed)
Jeneen Rinks MD aware of pt status and complaint.

## 2015-10-08 NOTE — ED Notes (Signed)
MD at bedside. 

## 2015-10-08 NOTE — ED Provider Notes (Signed)
[facility] DEPT Provider Note   CSN: [id number] Arrival date & time: 10/08/15  1547  First Provider Contact:  First MD Initiated Contact with Patient 10/08/15 1628        History   Chief Complaint Chief Complaint  Patient presents with  . Oral Swelling  . Other    Hypokalemia    HPI [name] Walker is a 55 y.o. female.  HPI Patient presents with tongue swelling that started 2 days ago. She states is worse on the left side than the right. There is been some improvement since yesterday. States she took Benadryl earlier. She's had some difficulty breathing but this is improved. Denies any other facial swelling, rash.No wheezing, nausea or vomiting. Patient has had some loose stools. She also complains of bilateral lower sugary swelling gets worse over the last few days. Was seen by primary physician on Monday and diagnosed with hypokalemia. She's been taking oral potassium replacement. She has chronic low back pain which is unchanged. No new weakness or numbness. Patient does describe some lightheadedness especially with standing. No syncope or recent head injury. Past Medical History:  Diagnosis Date  . ACHILLES TENDINITIS   . ALLERGIC RHINITIS   . GERD   . HYPERTENSION   . LATERAL EPICONDYLITIS, RIGHT   . LOW BACK PAIN   . Plantar fascial fibromatosis   . SUBACROMIAL BURSITIS, RIGHT   . Thoracic scoliosis 10/01/2015    Patient Active Problem List   Diagnosis Date Noted  . Thoracic scoliosis 10/01/2015  . Peripheral edema 10/01/2015  . Eczema 10/01/2015  . Asthma 10/01/2015  . Breast lump on right side at 4 o'clock position 01/21/2014  . Hot flashes 10/01/2013  . Grief reaction 07/17/2012  . Family history of colon cancer 07/02/2012  . Encounter for well adult exam with abnormal findings 08/23/2010  . Plantar fasciitis 08/23/2010  . ANKLE PAIN, BILATERAL 10/19/2008  . VAGINITIS 09/10/2008  . JOINT EFFUSION, ANKLE 09/10/2008  . SUBACROMIAL BURSITIS, RIGHT  09/01/2008  . ACHILLES TENDINITIS 09/01/2008  . LATERAL EPICONDYLITIS, RIGHT 08/18/2008  . Essential hypertension 07/12/2007  . ALLERGIC RHINITIS 07/12/2007  . GERD 07/12/2007  . Chronic low back pain 07/12/2007  . NEPHROLITHIASIS, HX OF 07/12/2007    Past Surgical History:  Procedure Laterality Date  . KIDNEY SURGERY    . TUBAL LIGATION      OB History    No data available       Home Medications    Prior to Admission medications   Medication Sig Start Date End Date Taking? Authorizing Provider  albuterol (PROVENTIL HFA;VENTOLIN HFA) 108 (90 Base) MCG/ACT inhaler Inhale 2 puffs into the lungs every 6 (six) hours as needed for wheezing or shortness of breath. 10/01/15  Yes Biagio Borg, MD  ALPRAZolam Duanne Moron) 0.25 MG tablet Take 1 tablet (0.25 mg total) by mouth 2 (two) times daily as needed for anxiety. 10/01/15  Yes Biagio Borg, MD  amLODipine (NORVASC) 5 MG tablet Take 1 tablet (5 mg total) by mouth daily. 10/01/15  Yes Biagio Borg, MD  cyclobenzaprine (FLEXERIL) 10 MG tablet Take 1 tablet (10 mg total) by mouth 3 (three) times daily as needed for muscle spasms. 10/01/15  Yes Biagio Borg, MD  desonide (DESOWEN) 0.05 % cream Apply topically 2 (two) times daily. 10/01/15  Yes Biagio Borg, MD  HYDROcodone-acetaminophen (NORCO/VICODIN) 5-325 MG tablet 1 tab by mouth daily as needed Patient taking differently: Take 1 tablet by mouth every 4 (four) hours as needed  for moderate pain. 1 tab by mouth daily as needed 10/01/15  Yes Biagio Borg, MD  ibuprofen (ADVIL,MOTRIN) 800 MG tablet Take 800 mg by mouth every 8 (eight) hours as needed for moderate pain.   Yes Historical Provider, MD  naproxen (NAPROSYN) 500 MG tablet Take 1 tablet (500 mg total) by mouth 2 (two) times daily. 10/01/15  Yes Biagio Borg, MD  pantoprazole (PROTONIX) 40 MG tablet Take 1 tablet (40 mg total) by mouth daily. 10/01/15  Yes Biagio Borg, MD  potassium chloride (KLOR-CON 10) 10 MEQ tablet Take 1 tablet (10 mEq total) by  mouth daily. 10/01/15  Yes Biagio Borg, MD  sodium chloride (OCEAN) 0.65 % SOLN nasal spray Place 1-2 sprays into both nostrils daily as needed for congestion.   Yes Historical Provider, MD  hydrochlorothiazide (HYDRODIURIL) 25 MG tablet Take 1 tablet (25 mg total) by mouth daily. Patient not taking: Reported on 10/08/2015 10/01/15   Biagio Borg, MD  predniSONE (DELTASONE) 20 MG tablet 3 tabs po day one, then 2 po daily x 4 days 10/08/15   Julianne Rice, MD    Family History Family History  Problem Relation Age of Onset  . Cancer Sister     colon cancer  . Arthritis Other   . Diabetes Other   . Hypertension Other   . Cancer Other     breast cancer and prostate cancer  . Stroke Other     Social History Social History  Substance Use Topics  . Smoking status: Current Every Day Smoker    Packs/day: 1.00    Types: Cigarettes  . Smokeless tobacco: Not on file  . Alcohol use No     Allergies   Other and Lisinopril   Review of Systems Review of Systems  Constitutional: Negative for chills and fever.  HENT: Positive for facial swelling.   Respiratory: Negative for chest tightness and shortness of breath.   Cardiovascular: Positive for leg swelling. Negative for chest pain and palpitations.  Gastrointestinal: Positive for diarrhea. Negative for abdominal pain, nausea and vomiting.  Genitourinary: Negative for dysuria, flank pain and frequency.  Musculoskeletal: Positive for back pain. Negative for joint swelling, myalgias and neck pain.  Skin: Negative for rash and wound.  Neurological: Positive for dizziness and light-headedness. Negative for syncope, weakness, numbness and headaches.  All other systems reviewed and are negative.    Physical Exam Updated Vital Signs BP 97/75 (BP Location: Left Arm)   Pulse 87   Temp 98.5 F (36.9 C) (Oral)   Resp 19   Ht 5' (1.524 m)   Wt 146 lb (66.2 kg)   LMP 08/22/2010   SpO2 100%   BMI 28.51 kg/m   Physical Exam    Constitutional: She is oriented to person, place, and time. She appears well-developed and well-nourished.  HENT:  Head: Normocephalic and atraumatic.  Mouth/Throat: Oropharynx is clear and moist.  Tongue swelling left greater than right. No swelling of the oral floor  Eyes: EOM are normal. Pupils are equal, round, and reactive to light.  Neck: Normal range of motion. Neck supple.  Cardiovascular: Normal rate and regular rhythm.   Speaks in clear voice. No respiratory distress.  Pulmonary/Chest: Effort normal and breath sounds normal. No stridor. No respiratory distress. She has no wheezes. She has no rales. She exhibits no tenderness.  Abdominal: Soft. Bowel sounds are normal. There is no tenderness. There is no rebound and no guarding.  Musculoskeletal: Normal range of motion. She  exhibits edema. She exhibits no tenderness.  1+ bilateral pitting edema to lower extremities  Neurological: She is alert and oriented to person, place, and time.  Moves all extremities without deficit. Sensation is fully intact.  Skin: Skin is warm and dry. No rash noted. No erythema.  Psychiatric: She has a normal mood and affect. Her behavior is normal.  Nursing note and vitals reviewed.    ED Treatments / Results  Labs (all labs ordered are listed, but only abnormal results are displayed) Labs Reviewed  BASIC METABOLIC PANEL - Abnormal; Notable for the following:       Result Value   Glucose, Bld 112 (*)    All other components within normal limits  CBC - Abnormal; Notable for the following:    Platelets 145 (*)    All other components within normal limits  URINALYSIS, ROUTINE W REFLEX MICROSCOPIC (NOT AT [facility]) - Abnormal; Notable for the following:    Specific Gravity, Urine 1.003 (*)    All other components within normal limits  CBG MONITORING, ED - Abnormal; Notable for the following:    Glucose-Capillary 130 (*)    All other components within normal limits  BRAIN NATRIURETIC PEPTIDE   MAGNESIUM  I-STAT TROPOININ, ED    EKG  EKG Interpretation  Date/Time:  Friday October 08 2015 16:04:06 EDT Ventricular Rate:  98 PR Interval:    QRS Duration: 63 QT Interval:  456 QTC Calculation: 583 R Axis:   28 Text Interpretation:  Sinus rhythm RSR' in V1 or V2, probably normal variant Borderline repolarization abnormality Prolonged QT interval Confirmed by Lita Mains  MD, Vallory Oetken ([id number]) on 10/08/2015 6:08:47 PM       Radiology No results found.  Procedures Procedures (including critical care time)  Medications Ordered in ED Medications  methylPREDNISolone sodium succinate (SOLU-MEDROL) 125 mg/2 mL injection 125 mg (125 mg Intravenous Given 10/08/15 1709)  famotidine (PEPCID) IVPB 20 mg premix (0 mg Intravenous Stopped 10/08/15 1932)  diphenhydrAMINE (BENADRYL) injection 25 mg (25 mg Intravenous Given 10/08/15 1706)     Initial Impression / Assessment and Plan / ED Course  I have reviewed the triage vital signs and the nursing notes.  Pertinent labs & imaging results that were available during my care of the patient were reviewed by me and considered in my medical decision making (see chart for details).  Clinical Course  Comment By Time  Patient states tongue swelling has improved slightly. We'll continue to monitor. Julianne Rice, MD 08/11 1925   Swelling to taste improved. Patient is now been observed for 5 hours in the emergency department. We'll discharge home with short course of steroids. Advised to follow-up with her primary physician. Return precautions been given.   Final Clinical Impressions(s) / ED Diagnoses   Final diagnoses:  Angioedema, initial encounter    New Prescriptions New Prescriptions   PREDNISONE (DELTASONE) 20 MG TABLET    3 tabs po day one, then 2 po daily x 4 days     Julianne Rice, MD 10/08/15 2108

## 2015-10-12 ENCOUNTER — Ambulatory Visit (INDEPENDENT_AMBULATORY_CARE_PROVIDER_SITE_OTHER): Payer: BC Managed Care – PPO | Admitting: Family

## 2015-10-12 ENCOUNTER — Encounter: Payer: Self-pay | Admitting: Family

## 2015-10-12 ENCOUNTER — Other Ambulatory Visit (INDEPENDENT_AMBULATORY_CARE_PROVIDER_SITE_OTHER): Payer: BC Managed Care – PPO

## 2015-10-12 DIAGNOSIS — K1379 Other lesions of oral mucosa: Secondary | ICD-10-CM | POA: Diagnosis not present

## 2015-10-12 DIAGNOSIS — R600 Localized edema: Secondary | ICD-10-CM

## 2015-10-12 DIAGNOSIS — T783XXA Angioneurotic edema, initial encounter: Secondary | ICD-10-CM

## 2015-10-12 DIAGNOSIS — E876 Hypokalemia: Secondary | ICD-10-CM

## 2015-10-12 DIAGNOSIS — I10 Essential (primary) hypertension: Secondary | ICD-10-CM | POA: Diagnosis not present

## 2015-10-12 LAB — BASIC METABOLIC PANEL
BUN: 14 mg/dL (ref 6–23)
CO2: 30 mEq/L (ref 19–32)
Calcium: 10 mg/dL (ref 8.4–10.5)
Chloride: 106 mEq/L (ref 96–112)
Creatinine, Ser: 0.66 mg/dL (ref 0.40–1.20)
GFR: 119.24 mL/min (ref >60.00)
Glucose, Bld: 151 mg/dL — ABNORMAL HIGH (ref 70–99)
Potassium: 3.9 mEq/L (ref 3.5–5.1)
Sodium: 142 mEq/L (ref 135–145)

## 2015-10-12 LAB — HEMOGLOBIN A1C: Hgb A1c MFr Bld: 5.6 % (ref 4.6–6.5)

## 2015-10-12 NOTE — Assessment & Plan Note (Signed)
Bilateral lower extremity edema appears mild with no pitting. Question possible origin of amlodipine although patient has been on it for approximately one year. Recommend conservative treatment with continued elevation, decrease sodium in diet, and compression socks as necessary. Consider discontinuation of amlodipine if symptoms continue. Continue current dosage of hydrochlorothiazide for blood pressure management and swelling.

## 2015-10-12 NOTE — Progress Notes (Signed)
Subjective:    Patient ID: [name] Walker, female    DOB: 03-May-1960, 55 y.o.   MRN: [id number]  Chief Complaint  Patient presents with  . Hospitalization Follow-up    needs sugar and potassium checked per hospital, still having some ankle swelling and she feels weak,     HPI:  [name] Walker is a 55 y.o. female who  has a past medical history of ACHILLES TENDINITIS; ALLERGIC RHINITIS; GERD; HYPERTENSION; LATERAL EPICONDYLITIS, RIGHT; LOW BACK PAIN; Plantar fascial fibromatosis; SUBACROMIAL BURSITIS, RIGHT; and Thoracic scoliosis (10/01/2015). and presents today for a follow up office visit.  This is a new problem.  Recently evaluated in the emergency department for tongue swelling that started approximately 2 days prior to arrival. She had taken Benadryl with some mild improvements. She also was noted to have bilateral lower extremity swelling had worsened over 2 days. She was diagnosed with hypokalemia in the primary care office prior to the ED. Physical exam with mild tongue swelling with the left side greater than the right with no respiratory distress and speaking in a clear voice. She was noted to have 1+ pitting edema to her lower extremities. Treated with methylprednisolone, famotidine, and diphenhydramine. Her symptoms were generally improved after 5 hours of observation in the ED. She was discharged in a short course of oral steroids with instructions to follow-up with primary care.  Since leaving the emergency department she continues to have some mild lower extremity edema and feelings of weakness. Symptoms of angioedema have appeared to be resolved. She notes small white lesions located in the lower right side of her mouth that started within the past few days. 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 Lower extremity edema continues to be worse at the end of the day and improved in the mornings secondary to elevation. No shortness of breath  or chest pain.   Allergies  Allergen Reactions  . Other Palpitations    ALL MUSCLE RELAXERS-PER PATIENT  . Lisinopril Other (See Comments)    ? Possible tongue swelling      Current Outpatient Prescriptions on File Prior to Visit  Medication Sig Dispense Refill  . albuterol (PROVENTIL HFA;VENTOLIN HFA) 108 (90 Base) MCG/ACT inhaler Inhale 2 puffs into the lungs every 6 (six) hours as needed for wheezing or shortness of breath. 1 Inhaler 11  . ALPRAZolam (XANAX) 0.25 MG tablet Take 1 tablet (0.25 mg total) by mouth 2 (two) times daily as needed for anxiety. 40 tablet 1  . amLODipine (NORVASC) 5 MG tablet Take 1 tablet (5 mg total) by mouth daily. 90 tablet 3  . cyclobenzaprine (FLEXERIL) 10 MG tablet Take 1 tablet (10 mg total) by mouth 3 (three) times daily as needed for muscle spasms. 30 tablet 1  . desonide (DESOWEN) 0.05 % cream Apply topically 2 (two) times daily. 30 g 2  . hydrochlorothiazide (HYDRODIURIL) 25 MG tablet Take 1 tablet (25 mg total) by mouth daily. 90 tablet 3  . HYDROcodone-acetaminophen (NORCO/VICODIN) 5-325 MG tablet 1 tab by mouth daily as needed (Patient taking differently: Take 1 tablet by mouth every 4 (four) hours as needed for moderate pain. 1 tab by mouth daily as needed) 30 tablet 0  . ibuprofen (ADVIL,MOTRIN) 800 MG tablet Take 800 mg by mouth every 8 (eight) hours as needed for moderate pain.    . naproxen (NAPROSYN) 500 MG tablet Take 1 tablet (500 mg total) by mouth 2 (two) times daily. 60 tablet  5  . pantoprazole (PROTONIX) 40 MG tablet Take 1 tablet (40 mg total) by mouth daily. 90 tablet 3  . potassium chloride (KLOR-CON 10) 10 MEQ tablet Take 1 tablet (10 mEq total) by mouth daily. 90 tablet 3  . predniSONE (DELTASONE) 20 MG tablet 3 tabs po day one, then 2 po daily x 4 days 11 tablet 0  . sodium chloride (OCEAN) 0.65 % SOLN nasal spray Place 1-2 sprays into both nostrils daily as needed for congestion.     No current facility-administered medications  on file prior to visit.      Past Surgical History:  Procedure Laterality Date  . KIDNEY SURGERY    . TUBAL LIGATION      Past Medical History:  Diagnosis Date  . ACHILLES TENDINITIS   . ALLERGIC RHINITIS   . GERD   . HYPERTENSION   . LATERAL EPICONDYLITIS, RIGHT   . LOW BACK PAIN   . Plantar fascial fibromatosis   . SUBACROMIAL BURSITIS, RIGHT   . Thoracic scoliosis 10/01/2015     Review of Systems  Constitutional: Negative for chills and fever.  HENT: Positive for mouth sores.   Respiratory: Negative for chest tightness and shortness of breath.   Cardiovascular: Positive for leg swelling. Negative for chest pain and palpitations.  Neurological: Positive for weakness. Negative for numbness.      Objective:    BP 118/88 (BP Location: Right Arm, Patient Position: Sitting, Cuff Size: Normal)   Pulse (!) 102   Temp 98.2 F (36.8 C) (Oral)   Ht 5' (1.524 m)   Wt 147 lb (66.7 kg)   LMP 08/22/2010   SpO2 96%   BMI 28.71 kg/m  Nursing note and vital signs reviewed.  Physical Exam  Constitutional: She is oriented to person, place, and time. She appears well-developed and well-nourished. No distress.  HENT:  Two small white lesions located in the lower right aspect of her cheek that appear like small stones. No evidence of thrush.   Cardiovascular: Normal rate, regular rhythm, normal heart sounds and intact distal pulses.   Mild lower extremity edema of 1+ and non-pitting.   Pulmonary/Chest: Effort normal and breath sounds normal. No respiratory distress. She has no wheezes. She has no rales. She exhibits no tenderness.  Neurological: She is alert and oriented to person, place, and time.  Skin: Skin is warm and dry.  Psychiatric: She has a normal mood and affect. Her behavior is normal. Judgment and thought content normal.       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 Continue current dosage of hydrochlorothiazide and amlodipine and recommend discussing with primary care provider and he requests to change medications.      Relevant Orders   Hemoglobin A1c (Completed)     Other   Angioedema    Symptoms of angioedema appears resolved with no further evidence of swelling of her lips or tongue. Unknown source of angioedema with concern for possible cyclobenzaprine. 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 No further treatment is necessary at this time and will continue to monitor.  Relevant Orders   Basic Metabolic Panel (BMET) (Completed)   Bilateral lower extremity edema    Bilateral lower extremity edema appears mild with no pitting. Question possible origin of amlodipine although patient has been on it for approximately one year. Recommend conservative treatment with continued elevation, decrease sodium in diet, and compression socks as necessary. Consider discontinuation of amlodipine if symptoms continue. Continue current dosage of hydrochlorothiazide for blood pressure management and swelling.       Relevant Orders   Hemoglobin A1c (Completed)   Mouth sores    2 small mouth sores noted on the lower right cheek. Treat conservatively with continued usage of over-the-counter mouthwash and consider hydrogen peroxide swishes. Follow-up if symptoms worsen or do not improve for possible Magic mouthwash solution.      Hypokalemia    Noted to have hypokalemia during most recent blood work which appears resolved in the hospital through her emergency room visit. Obtain basic metabolic panel to check potassium and kidney function.  Continue current dosage of potassium pending basic metabolic profile results. Cannot rule out hypokalemia secondary to hydrochlorothiazide. Continue to monitor.       Other Visit Diagnoses   None.      I am having Ms. Ante maintain her sodium chloride, albuterol, ALPRAZolam, amLODipine, cyclobenzaprine, HYDROcodone-acetaminophen, pantoprazole, desonide, naproxen, hydrochlorothiazide, potassium chloride, ibuprofen, and predniSONE.   Follow-up: Return in about 1 month (around 11/12/2015), or if symptoms worsen or fail to improve.  Mauricio Po, FNP

## 2015-10-12 NOTE — Assessment & Plan Note (Signed)
Symptoms of angioedema appears resolved with no further evidence of swelling of her lips or tongue. Unknown source of angioedema with concern for possible cyclobenzaprine. 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 No further treatment is necessary at this time and will continue to monitor.

## 2015-10-12 NOTE — Patient Instructions (Addendum)
Thank you for choosing [facility].  Summary/Instructions:  Hydrogen peroxide swishes as needed for mouth.  Continue to take your medication as prescribed.  We will check your kidney function or potassium.   Your prescription(s) have been submitted to your pharmacy or been printed and provided for you. Please take as directed and contact our office if you believe you are having problem(s) with the medication(s) or have any questions.  Please stop by the lab on the lower level of the building for your blood work. Your results will be released to [facility] (or called to you) after review, usually within 72 hours after test completion. If any changes need to be made, you will be notified at that same time.  1. The lab is open from 7:30am to 5:30 pm Monday-Friday  2. No appointment is necessary  3. Fasting (if needed) is 6-8 hours after food and drink; black coffee and water  are okay   If your symptoms worsen or fail to improve, please contact our office for further instruction, or in case of emergency go directly to the emergency room at the closest medical facility.

## 2015-10-12 NOTE — Assessment & Plan Note (Signed)
Noted to have hypokalemia during most recent blood work which appears resolved in the hospital through her emergency room visit. Obtain basic metabolic panel to check potassium and kidney function. Continue current dosage of potassium pending basic metabolic profile results. Cannot rule out hypokalemia secondary to hydrochlorothiazide. Continue to monitor.

## 2015-10-12 NOTE — Assessment & Plan Note (Signed)
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 Continue current dosage of hydrochlorothiazide and amlodipine and recommend discussing with primary care provider and he requests to change medications.

## 2015-10-12 NOTE — Assessment & Plan Note (Signed)
2 small mouth sores noted on the lower right cheek. Treat conservatively with continued usage of over-the-counter mouthwash and consider hydrogen peroxide swishes. Follow-up if symptoms worsen or do not improve for possible Magic mouthwash solution.

## 2015-10-13 ENCOUNTER — Telehealth: Payer: Self-pay | Admitting: Internal Medicine

## 2015-10-13 ENCOUNTER — Emergency Department (HOSPITAL_COMMUNITY): Payer: BC Managed Care – PPO

## 2015-10-13 ENCOUNTER — Emergency Department (HOSPITAL_COMMUNITY)
Admission: EM | Admit: 2015-10-13 | Discharge: 2015-10-13 | Disposition: A | Discharge status: Home / Self Care | Payer: BC Managed Care – PPO | Attending: Emergency Medicine | Admitting: Emergency Medicine

## 2015-10-13 ENCOUNTER — Encounter (HOSPITAL_COMMUNITY): Payer: Self-pay | Admitting: Emergency Medicine

## 2015-10-13 DIAGNOSIS — Z791 Long term (current) use of non-steroidal anti-inflammatories (NSAID): Secondary | ICD-10-CM | POA: Insufficient documentation

## 2015-10-13 DIAGNOSIS — J45909 Unspecified asthma, uncomplicated: Secondary | ICD-10-CM | POA: Insufficient documentation

## 2015-10-13 DIAGNOSIS — R319 Hematuria, unspecified: Secondary | ICD-10-CM | POA: Diagnosis not present

## 2015-10-13 DIAGNOSIS — F17210 Nicotine dependence, cigarettes, uncomplicated: Secondary | ICD-10-CM | POA: Diagnosis not present

## 2015-10-13 DIAGNOSIS — N200 Calculus of kidney: Secondary | ICD-10-CM | POA: Insufficient documentation

## 2015-10-13 DIAGNOSIS — N390 Urinary tract infection, site not specified: Secondary | ICD-10-CM | POA: Diagnosis not present

## 2015-10-13 DIAGNOSIS — I10 Essential (primary) hypertension: Secondary | ICD-10-CM | POA: Diagnosis not present

## 2015-10-13 DIAGNOSIS — R109 Unspecified abdominal pain: Secondary | ICD-10-CM

## 2015-10-13 DIAGNOSIS — Z79899 Other long term (current) drug therapy: Secondary | ICD-10-CM | POA: Insufficient documentation

## 2015-10-13 LAB — URINALYSIS, ROUTINE W REFLEX MICROSCOPIC
Bilirubin Urine: NEGATIVE
Glucose, UA: NEGATIVE mg/dL
Ketones, ur: NEGATIVE mg/dL
Leukocytes, UA: NEGATIVE
Nitrite: NEGATIVE
Protein, ur: NEGATIVE mg/dL
Specific Gravity, Urine: 1.015 (ref 1.005–1.030)
pH: 7.5 (ref 5.0–8.0)

## 2015-10-13 LAB — BASIC METABOLIC PANEL
Anion gap: 5 (ref 5–15)
BUN: 14 mg/dL (ref 6–20)
CO2: 28 mmol/L (ref 22–32)
Calcium: 9.4 mg/dL (ref 8.9–10.3)
Chloride: 106 mmol/L (ref 101–111)
Creatinine, Ser: 0.59 mg/dL (ref 0.44–1.00)
GFR calc Af Amer: >60 mL/min (ref >60)
GFR calc non Af Amer: >60 mL/min (ref >60)
Glucose, Bld: 159 mg/dL — ABNORMAL HIGH (ref 65–99)
Potassium: 3.3 mmol/L — ABNORMAL LOW (ref 3.5–5.1)
Sodium: 139 mmol/L (ref 135–145)

## 2015-10-13 LAB — URINE MICROSCOPIC-ADD ON

## 2015-10-13 LAB — CBC WITH DIFFERENTIAL/PLATELET
Basophils Absolute: 0 10*3/uL (ref 0.0–0.1)
Basophils Relative: 0 %
Eosinophils Absolute: 0.1 10*3/uL (ref 0.0–0.7)
Eosinophils Relative: 1 %
HCT: 42.4 % (ref 36.0–46.0)
Hemoglobin: 14.1 g/dL (ref 12.0–15.0)
Lymphocytes Relative: 31 %
Lymphs Abs: 2.7 10*3/uL (ref 0.7–4.0)
MCH: 28.1 pg (ref 26.0–34.0)
MCHC: 33.3 g/dL (ref 30.0–36.0)
MCV: 84.6 fL (ref 78.0–100.0)
Monocytes Absolute: 0.7 10*3/uL (ref 0.1–1.0)
Monocytes Relative: 8 %
Neutro Abs: 5.3 10*3/uL (ref 1.7–7.7)
Neutrophils Relative %: 60 %
Platelets: 162 10*3/uL (ref 150–400)
RBC: 5.01 MIL/uL (ref 3.87–5.11)
RDW: 14.2 % (ref 11.5–15.5)
WBC: 8.9 10*3/uL (ref 4.0–10.5)

## 2015-10-13 MED ORDER — KETOROLAC TROMETHAMINE 30 MG/ML IJ SOLN
30.0000 mg | Freq: Once | INTRAMUSCULAR | Status: AC
  Administered 2015-10-13: 30 mg via INTRAVENOUS
  Filled 2015-10-13: qty 1

## 2015-10-13 MED ORDER — OXYCODONE-ACETAMINOPHEN 5-325 MG PO TABS: 1.0000 | ORAL_TABLET | ORAL | 0 refills | Status: DC | PRN

## 2015-10-13 MED ORDER — CEPHALEXIN 500 MG PO CAPS: 500.0000 mg | ORAL_CAPSULE | Freq: Four times a day (QID) | ORAL | 0 refills | Status: DC

## 2015-10-13 MED ORDER — SODIUM CHLORIDE 0.9 % IV SOLN
INTRAVENOUS | Status: DC
  Administered 2015-10-13: 15:00:00 via INTRAVENOUS

## 2015-10-13 MED ORDER — FENTANYL CITRATE (PF) 100 MCG/2ML IJ SOLN
100.0000 ug | INTRAMUSCULAR | Status: DC | PRN
  Administered 2015-10-13: 100 ug via INTRAVENOUS
  Filled 2015-10-13: qty 2

## 2015-10-13 MED ORDER — ONDANSETRON HCL 4 MG/2ML IJ SOLN
4.0000 mg | Freq: Once | INTRAMUSCULAR | Status: AC
  Administered 2015-10-13: 4 mg via INTRAVENOUS
  Filled 2015-10-13: qty 2

## 2015-10-13 NOTE — ED Provider Notes (Signed)
[facility] DEPT Provider Note   CSN: [id number] Arrival date & time: 10/13/15  1311     History   Chief Complaint Chief Complaint  Patient presents with  . Nephrolithiasis    HPI [name] Walker is a 55 y.o. female.  She presents for evaluation of left flank pain, which started suddenly today. She saw her doctor yesterday for a problem of tongue swelling, including dry mouth. No trauma. She has noticed some dysuria and has some lower abdominal pain today. She denies hematuria or urinary frequency. Is been no fever, chills, cough, chest pain, weakness or dizziness. History of urolithiasis, not recent. There are no other known modifying factors.  HPI  Past Medical History:  Diagnosis Date  . ACHILLES TENDINITIS   . ALLERGIC RHINITIS   . GERD   . HYPERTENSION   . LATERAL EPICONDYLITIS, RIGHT   . LOW BACK PAIN   . Plantar fascial fibromatosis   . SUBACROMIAL BURSITIS, RIGHT   . Thoracic scoliosis 10/01/2015    Patient Active Problem List   Diagnosis Date Noted  . Angioedema 10/12/2015  . Bilateral lower extremity edema 10/12/2015  . Mouth sores 10/12/2015  . Hypokalemia 10/12/2015  . Thoracic scoliosis 10/01/2015  . Peripheral edema 10/01/2015  . Eczema 10/01/2015  . Asthma 10/01/2015  . Breast lump on right side at 4 o'clock position 01/21/2014  . Hot flashes 10/01/2013  . Grief reaction 07/17/2012  . Family history of colon cancer 07/02/2012  . Encounter for well adult exam with abnormal findings 08/23/2010  . Plantar fasciitis 08/23/2010  . ANKLE PAIN, BILATERAL 10/19/2008  . VAGINITIS 09/10/2008  . JOINT EFFUSION, ANKLE 09/10/2008  . SUBACROMIAL BURSITIS, RIGHT 09/01/2008  . ACHILLES TENDINITIS 09/01/2008  . LATERAL EPICONDYLITIS, RIGHT 08/18/2008  . Essential hypertension 07/12/2007  . ALLERGIC RHINITIS 07/12/2007  . GERD 07/12/2007  . Chronic low back pain 07/12/2007  . NEPHROLITHIASIS, HX OF 07/12/2007    Past Surgical History:  Procedure  Laterality Date  . KIDNEY SURGERY    . TUBAL LIGATION      OB History    No data available       Home Medications    Prior to Admission medications   Medication Sig Start Date End Date Taking? Authorizing Provider  albuterol (PROVENTIL HFA;VENTOLIN HFA) 108 (90 Base) MCG/ACT inhaler Inhale 2 puffs into the lungs every 6 (six) hours as needed for wheezing or shortness of breath. 10/01/15  Yes Biagio Borg, MD  ALPRAZolam Duanne Moron) 0.25 MG tablet Take 1 tablet (0.25 mg total) by mouth 2 (two) times daily as needed for anxiety. 10/01/15  Yes Biagio Borg, MD  amLODipine (NORVASC) 5 MG tablet Take 1 tablet (5 mg total) by mouth daily. 10/01/15  Yes Biagio Borg, MD  cyclobenzaprine (FLEXERIL) 10 MG tablet Take 1 tablet (10 mg total) by mouth 3 (three) times daily as needed for muscle spasms. 10/01/15  Yes Biagio Borg, MD  desonide (DESOWEN) 0.05 % cream Apply topically 2 (two) times daily. 10/01/15  Yes Biagio Borg, MD  hydrochlorothiazide (HYDRODIURIL) 25 MG tablet Take 1 tablet (25 mg total) by mouth daily. 10/01/15  Yes Biagio Borg, MD  HYDROcodone-acetaminophen (NORCO/VICODIN) 5-325 MG tablet 1 tab by mouth daily as needed Patient taking differently: Take 1 tablet by mouth every 4 (four) hours as needed for moderate pain. 1 tab by mouth daily as needed 10/01/15  Yes Biagio Borg, MD  ibuprofen (ADVIL,MOTRIN) 800 MG tablet Take 800 mg by mouth every  8 (eight) hours as needed for moderate pain.   Yes Historical Provider, MD  pantoprazole (PROTONIX) 40 MG tablet Take 1 tablet (40 mg total) by mouth daily. 10/01/15  Yes Biagio Borg, MD  potassium chloride (KLOR-CON 10) 10 MEQ tablet Take 1 tablet (10 mEq total) by mouth daily. 10/01/15  Yes Biagio Borg, MD  predniSONE (DELTASONE) 20 MG tablet 3 tabs po day one, then 2 po daily x 4 days 10/08/15  Yes Julianne Rice, MD  sodium chloride (OCEAN) 0.65 % SOLN nasal spray Place 1-2 sprays into both nostrils daily as needed for congestion.   Yes Historical  Provider, MD  cephALEXin (KEFLEX) 500 MG capsule Take 1 capsule (500 mg total) by mouth 4 (four) times daily. 10/13/15   Daleen Bo, MD  oxyCODONE-acetaminophen (PERCOCET) 5-325 MG tablet Take 1 tablet by mouth every 4 (four) hours as needed for severe pain. 10/13/15   Daleen Bo, MD    Family History Family History  Problem Relation Age of Onset  . Cancer Sister     colon cancer  . Arthritis Other   . Diabetes Other   . Hypertension Other   . Cancer Other     breast cancer and prostate cancer  . Stroke Other     Social History Social History  Substance Use Topics  . Smoking status: Current Every Day Smoker    Packs/day: 1.00    Types: Cigarettes  . Smokeless tobacco: Not on file  . Alcohol use No     Allergies   Other and Lisinopril   Review of Systems Review of Systems  All other systems reviewed and are negative.    Physical Exam Updated Vital Signs BP 134/97 (BP Location: Left Arm)   Pulse 82   Resp 16   LMP 08/22/2010   SpO2 100%   Physical Exam  Constitutional: She is oriented to person, place, and time. She appears well-developed and well-nourished.  HENT:  Head: Normocephalic and atraumatic.  Eyes: Conjunctivae and EOM are normal. Pupils are equal, round, and reactive to light.  Neck: Normal range of motion and phonation normal. Neck supple.  Cardiovascular: Normal rate and regular rhythm.   Pulmonary/Chest: Effort normal and breath sounds normal. She exhibits no tenderness.  Abdominal: Soft. She exhibits no distension and no mass. There is tenderness (Mild lower mid abdominal tenderness.). There is no rebound and no guarding.  Musculoskeletal: Normal range of motion.  Tender left lumbar, with somewhat diminished movement. Lower back secondary to pain.  Neurological: She is alert and oriented to person, place, and time. She exhibits normal muscle tone.  Skin: Skin is warm and dry.  Psychiatric: She has a normal mood and affect. Her behavior is  normal. Judgment and thought content normal.  Nursing note and vitals reviewed.    ED Treatments / Results  Labs (all labs ordered are listed, but only abnormal results are displayed) Labs Reviewed  URINALYSIS, ROUTINE W REFLEX MICROSCOPIC (NOT AT [facility]) - Abnormal; Notable for the following:       Result Value   APPearance TURBID (*)    Hgb urine dipstick MODERATE (*)    All other components within normal limits  BASIC METABOLIC PANEL - Abnormal; Notable for the following:    Potassium 3.3 (*)    Glucose, Bld 159 (*)    All other components within normal limits  URINE MICROSCOPIC-ADD ON - Abnormal; Notable for the following:    Squamous Epithelial / LPF 0-5 (*)  Bacteria, UA MANY (*)    All other components within normal limits  URINE CULTURE  CBC WITH DIFFERENTIAL/PLATELET    EKG  EKG Interpretation None       Radiology Ct Renal Stone Study  Result Date: 10/13/2015 CLINICAL DATA:  Left-sided flank pain for several hours, initial encounter EXAM: CT ABDOMEN AND PELVIS WITHOUT CONTRAST TECHNIQUE: Multidetector CT imaging of the abdomen and pelvis was performed following the standard protocol without IV contrast. COMPARISON:  04/20/2014 FINDINGS: Lower chest:  Mild emphysematous changes are again identified. Hepatobiliary: No mass visualized on this un-enhanced exam. Pancreas: No mass or inflammatory process identified on this un-enhanced exam. Spleen: Within normal limits in size. Adrenals/Urinary Tract: Adrenal glands are within normal limits bilaterally. Left kidney demonstrates a nonobstructive stone in the lower pole measuring 5 mm. The left ureter is within normal limits. The right kidney demonstrates scattered stones similar to that seen on the prior exam measuring approximately 4 mm in greatest dimension. The right ureter is within normal limits. The bladder is well distended. Stomach/Bowel: No evidence of obstruction, inflammatory process, or abnormal fluid collections.  The appendix is within normal limits. Minimal diverticular change is seen. Vascular/Lymphatic: No pathologically enlarged lymph nodes. No evidence of abdominal aortic aneurysm. Reproductive: No mass or other significant abnormality. Other: No free fluid is noted. Musculoskeletal:  No acute bony abnormality is noted. IMPRESSION: Nonobstructing bilateral renal stones. The lower pole left renal stone has grown in size from the prior exam Electronically Signed   By: Inez Catalina M.D.   On: 10/13/2015 15:24    Procedures Procedures (including critical care time)  Medications Ordered in ED Medications  fentaNYL (SUBLIMAZE) injection 100 mcg (100 mcg Intravenous Given 10/13/15 1438)  0.9 %  sodium chloride infusion ( Intravenous New Bag/Given 10/13/15 1438)  ondansetron (ZOFRAN) injection 4 mg (4 mg Intravenous Given 10/13/15 1438)  ketorolac (TORADOL) 30 MG/ML injection 30 mg (30 mg Intravenous Given 10/13/15 1620)     Initial Impression / Assessment and Plan / ED Course  I have reviewed the triage vital signs and the nursing notes.  Pertinent labs & imaging results that were available during my care of the patient were reviewed by me and considered in my medical decision making (see chart for details).  Clinical Course    Medications  fentaNYL (SUBLIMAZE) injection 100 mcg (100 mcg Intravenous Given 10/13/15 1438)  0.9 %  sodium chloride infusion ( Intravenous New Bag/Given 10/13/15 1438)  ondansetron (ZOFRAN) injection 4 mg (4 mg Intravenous Given 10/13/15 1438)  ketorolac (TORADOL) 30 MG/ML injection 30 mg (30 mg Intravenous Given 10/13/15 1620)    Patient Vitals for the past 24 hrs:  BP Pulse Resp SpO2  10/13/15 1620 134/97 82 16 100 %  10/13/15 1438 147/96 80 16 100 %  10/13/15 1331 (!) 145/115 - - -    4:14 PM Reevaluation with update and discussion. After initial assessment and treatment, an updated evaluation reveals She would like another dose of pain medicine prior to leaving.  Findings discussed with patient and family member, all questions answered. Ranya Fiddler L    Final Clinical Impressions(s) / ED Diagnoses   Final diagnoses:  Nephrolithiasis  Left flank pain  Urinary tract infection with hematuria, site unspecified    Nonspecific flank pain. Left kidney stones, without ureteral stones. Nonspecific blood in urine. Possible urinary tract infection, culture ordered. No evidence for acute allergic reaction, sepsis, metabolic instability or impending vascular collapse.  Nursing Notes Reviewed/ Care Coordinated Applicable Imaging Reviewed  Interpretation of Laboratory Data incorporated into ED treatment  The patient appears reasonably screened and/or stabilized for discharge and I doubt any other medical condition or other [facility] requiring further screening, evaluation, or treatment in the ED at this time prior to discharge.  Plan: Home Medications- continue; Home Treatments- rest; return here if the recommended treatment, does not improve the symptoms; Recommended follow up- PCP and Urology 1 week   New Prescriptions New Prescriptions   CEPHALEXIN (KEFLEX) 500 MG CAPSULE    Take 1 capsule (500 mg total) by mouth 4 (four) times daily.   OXYCODONE-ACETAMINOPHEN (PERCOCET) 5-325 MG TABLET    Take 1 tablet by mouth every 4 (four) hours as needed for severe pain.     Daleen Bo, MD 10/13/15 [phone]

## 2015-10-13 NOTE — ED Notes (Signed)
Pt ambulatory to bathroom independently w/ normal gait.

## 2015-10-13 NOTE — ED Triage Notes (Signed)
Pt c/o severe L side flank pain since this morning. Pt st she has been diagnosed with a kidney stone. Pt denies urinary symptoms. A&Ox4. Pt appears to be in pain, groaning and leaning onto her R side.

## 2015-10-13 NOTE — Discharge Instructions (Signed)
Use heat on the sore area 3 or 4 times a day.  Take ibuprofen 400 mg 3 times a day if needed for pain. Use the stronger pain medicine, if needed.  Start the antibiotic for urinary tract infection, as soon as possible.

## 2015-10-13 NOTE — ED Notes (Signed)
Patient transported to CT 

## 2015-10-13 NOTE — Telephone Encounter (Signed)
Patient Name: KILAH LAURO  DOB: 1960/06/29    Initial Comment Caller states her mother is feeling sick- abd pain- potassium low and dizzy   Nurse Assessment  Nurse: Mallie Mussel, RN, Alveta Heimlich Date/Time Eilene Ghazi Time): 10/13/2015 12:41:51 PM  Confirm and document reason for call. If symptomatic, describe symptoms. You must click the next button to save text entered. ---Caller states that her mother has left sided abdominal pain which goes around toward the back. This pain began today. She has been weak since last week. Was seen last week and she had angioedema. She did a follow up yesterday at [facility] and was told she to stop the BP med to see if that was the cause. She rates her mother's pain as severe based on her behavior. Denies pain/burning with urination. Denies fever.  Has the patient traveled out of the country within the last 30 days? ---No  Does the patient have any new or worsening symptoms? ---Yes  Will a triage be completed? ---Yes  Related visit to physician within the last 2 weeks? ---No  Does the PT have any chronic conditions? (i.e. diabetes, asthma, etc.) ---Yes  List chronic conditions. ---Asthma, HTN,  Is this a behavioral health or substance abuse call? ---No     Guidelines    Guideline Title Affirmed Question Affirmed Notes  Flank Pain [1] SEVERE pain (e.g., excruciating, scale 8-10) AND [2] present > 1 hour    Final Disposition User   Go to ED Now Mallie Mussel, RN, Alveta Heimlich    Comments  Previous record was made in NIGHT- acuity changed by CN to a 2   Referrals  [facility] - ED   Disagree/Comply: Comply

## 2015-10-14 LAB — URINE CULTURE: Culture: NO GROWTH

## 2015-11-02 ENCOUNTER — Telehealth: Payer: Self-pay | Admitting: Internal Medicine

## 2015-11-02 NOTE — Telephone Encounter (Signed)
Pt called in said that she seen urology for her kidney stone.  She feel like she has a uti or an infection now.  She wants to know if she needs to be seen or if there can be anything called in for her?

## 2015-11-02 NOTE — Telephone Encounter (Signed)
Called patient left message for call back.  

## 2015-11-02 NOTE — Telephone Encounter (Signed)
Please make rov as sometimes discomfort alone may not mean an infection

## 2015-11-05 ENCOUNTER — Telehealth: Payer: Self-pay | Admitting: *Deleted

## 2015-11-05 MED ORDER — IBUPROFEN 800 MG PO TABS: 800.0000 mg | ORAL_TABLET | Freq: Three times a day (TID) | ORAL | 0 refills | Status: DC | PRN

## 2015-11-05 NOTE — Telephone Encounter (Signed)
Rec'd call pt is wanting refill on her ibuprofen 800 mg. She is having some LBP due to the kidney stones that she is having. Verified pharmacy inform will send to [place]...Johny Chess

## 2015-11-11 ENCOUNTER — Ambulatory Visit (INDEPENDENT_AMBULATORY_CARE_PROVIDER_SITE_OTHER): Payer: BC Managed Care – PPO | Admitting: Internal Medicine

## 2015-11-11 ENCOUNTER — Other Ambulatory Visit: Payer: BC Managed Care – PPO

## 2015-11-11 ENCOUNTER — Encounter: Payer: Self-pay | Admitting: Internal Medicine

## 2015-11-11 VITALS — BP 118/76 | HR 100 | Temp 98.5°F | Resp 20 | Wt 155.0 lb

## 2015-11-11 DIAGNOSIS — R350 Frequency of micturition: Secondary | ICD-10-CM

## 2015-11-11 DIAGNOSIS — I10 Essential (primary) hypertension: Secondary | ICD-10-CM | POA: Diagnosis not present

## 2015-11-11 DIAGNOSIS — IMO0001 Reserved for inherently not codable concepts without codable children: Secondary | ICD-10-CM

## 2015-11-11 LAB — POCT URINALYSIS DIPSTICK
Bilirubin, UA: NEGATIVE
Blood, UA: NEGATIVE
Glucose, UA: NEGATIVE
Ketones, UA: NEGATIVE
Leukocytes, UA: NEGATIVE
Nitrite, UA: NEGATIVE
Protein, UA: NEGATIVE
Spec Grav, UA: 1.025
Urobilinogen, UA: NEGATIVE
pH, UA: 6

## 2015-11-11 MED ORDER — FESOTERODINE FUMARATE ER 4 MG PO TB24: 4.0000 mg | ORAL_TABLET | Freq: Every day | ORAL | 3 refills | Status: DC

## 2015-11-11 NOTE — Progress Notes (Signed)
Subjective:    Patient ID: [name] Walker, female    DOB: 14-Nov-1960, 55 y.o.   MRN: [id number]  HPI  Here to f/u , c/o recent uti, and with hx of kidney stones tx per urology, now with transient now improved Some dysuria last 5 days ago, but more concerned with urinary freq every 15 min for 2 mo. Denies urinary symptoms such as dysuria, urgency, flank pain, hematuria or n/v, fever, chills. Pt denies chest pain, increased sob or doe, wheezing, orthopnea, PND, increased LE swelling, palpitations, dizziness or syncope.  Pt denies new neurological symptoms such as new headache, or facial or extremity weakness or numbness   Past Medical History:  Diagnosis Date  . ACHILLES TENDINITIS   . ALLERGIC RHINITIS   . GERD   . HYPERTENSION   . LATERAL EPICONDYLITIS, RIGHT   . LOW BACK PAIN   . Plantar fascial fibromatosis   . SUBACROMIAL BURSITIS, RIGHT   . Thoracic scoliosis 10/01/2015   Past Surgical History:  Procedure Laterality Date  . KIDNEY SURGERY    . TUBAL LIGATION      reports that she has been smoking Cigarettes.  She has been smoking about 1.00 pack per day. She has never used smokeless tobacco. She reports that she does not drink alcohol. Her drug history is not on file. family history includes Arthritis in her other; Cancer in her other and sister; Diabetes in her other; Hypertension in her other; Stroke in her other. Allergies  Allergen Reactions  . Other Palpitations    ALL MUSCLE RELAXERS-PER PATIENT  . Lisinopril Other (See Comments)    ? Possible tongue swelling    Current Outpatient Prescriptions on File Prior to Visit  Medication Sig Dispense Refill  . albuterol (PROVENTIL HFA;VENTOLIN HFA) 108 (90 Base) MCG/ACT inhaler Inhale 2 puffs into the lungs every 6 (six) hours as needed for wheezing or shortness of breath. 1 Inhaler 11  . ALPRAZolam (XANAX) 0.25 MG tablet Take 1 tablet (0.25 mg total) by mouth 2 (two) times daily as needed for anxiety. 40 tablet 1  . amLODipine  (NORVASC) 5 MG tablet Take 1 tablet (5 mg total) by mouth daily. 90 tablet 3  . cyclobenzaprine (FLEXERIL) 10 MG tablet Take 1 tablet (10 mg total) by mouth 3 (three) times daily as needed for muscle spasms. 30 tablet 1  . desonide (DESOWEN) 0.05 % cream Apply topically 2 (two) times daily. 30 g 2  . hydrochlorothiazide (HYDRODIURIL) 25 MG tablet Take 1 tablet (25 mg total) by mouth daily. 90 tablet 3  . HYDROcodone-acetaminophen (NORCO/VICODIN) 5-325 MG tablet 1 tab by mouth daily as needed (Patient taking differently: Take 1 tablet by mouth every 4 (four) hours as needed for moderate pain. 1 tab by mouth daily as needed) 30 tablet 0  . ibuprofen (ADVIL,MOTRIN) 800 MG tablet Take 1 tablet (800 mg total) by mouth every 8 (eight) hours as needed for moderate pain. 30 tablet 0  . oxyCODONE-acetaminophen (PERCOCET) 5-325 MG tablet Take 1 tablet by mouth every 4 (four) hours as needed for severe pain. 20 tablet 0  . pantoprazole (PROTONIX) 40 MG tablet Take 1 tablet (40 mg total) by mouth daily. 90 tablet 3  . potassium chloride (KLOR-CON 10) 10 MEQ tablet Take 1 tablet (10 mEq total) by mouth daily. 90 tablet 3  . sodium chloride (OCEAN) 0.65 % SOLN nasal spray Place 1-2 sprays into both nostrils daily as needed for congestion.     No current facility-administered medications  on file prior to visit.    Review of Systems  All otherwise neg per pt     Objective:   Physical Exam BP 118/76   Pulse 100   Temp 98.5 F (36.9 C) (Oral)   Resp 20   Wt 155 lb (70.3 kg)   LMP 08/22/2010   SpO2 97%   BMI 30.27 kg/m  VS noted,  Constitutional: Pt appears in no apparent distress HENT: Head: NCAT.  Right Ear: External ear normal.  Left Ear: External ear normal.  Eyes: . Pupils are equal, round, and reactive to light. Conjunctivae and EOM are normal Neck: Normal range of motion. Neck supple.  Cardiovascular: Normal rate and regular rhythm.   Pulmonary/Chest: Effort normal and breath sounds without  rales or wheezing.  Abd:  Soft, NT, ND, + BS, no flank tender Neurological: Pt is alert. Not confused , motor grossly intact Skin: Skin is warm. No rash, no LE edema Psychiatric: Pt behavior is normal. No agitation.   POCT urinalysis dipstick  Order: [id number]  Status:  Final result Visible to patient:  No (Not Released) Dx:  Frequency    Ref Range & Units 16:53 4wk ago 34mo ago   Color, UA  yellow      Clarity, UA  cloudy      Glucose, UA  negative  NEGATIVE  NEGATIVE    Bilirubin, UA  negative      Ketones, UA  negative      Spec Grav, UA  1.025      Blood, UA  negative      pH, UA  6.0      Protein, UA  negative      Urobilinogen, UA  negative      Nitrite, UA  negative      Leukocytes, UA Negative Negative              Assessment & Plan:

## 2015-11-11 NOTE — Progress Notes (Signed)
Pre visit review using our clinic review tool, if applicable. No additional management support is needed unless otherwise documented below in the visit note. 

## 2015-11-11 NOTE — Patient Instructions (Signed)
Your A1c was Ok today  Please take all new medication as prescribed - the [name] Walker  Please have the pharmacy call with the name of a similar medication if the [name] Walker is not covered by your insurance  Please continue all other medications as before, and refills have been done if requested.  Please have the pharmacy call with any other refills you may need.  Please keep your appointments with your specialists as you may have planned

## 2015-11-13 ENCOUNTER — Encounter (HOSPITAL_COMMUNITY): Payer: Self-pay | Admitting: Emergency Medicine

## 2015-11-13 ENCOUNTER — Ambulatory Visit (HOSPITAL_COMMUNITY)
Admission: EM | Admit: 2015-11-13 | Discharge: 2015-11-13 | Disposition: A | Discharge status: Home / Self Care | Payer: BC Managed Care – PPO | Attending: Family Medicine | Admitting: Family Medicine

## 2015-11-13 DIAGNOSIS — N3281 Overactive bladder: Secondary | ICD-10-CM

## 2015-11-13 LAB — POCT URINALYSIS DIP (DEVICE)
Bilirubin Urine: NEGATIVE
Glucose, UA: NEGATIVE mg/dL
Ketones, ur: NEGATIVE mg/dL
Leukocytes, UA: NEGATIVE
Nitrite: NEGATIVE
Protein, ur: NEGATIVE mg/dL
Specific Gravity, Urine: <=1.005 (ref 1.005–1.030)
Urobilinogen, UA: 0.2 mg/dL (ref 0.0–1.0)
pH: 6.5 (ref 5.0–8.0)

## 2015-11-13 LAB — POCT I-STAT, CHEM 8
BUN: 12 mg/dL (ref 6–20)
Calcium, Ion: 1.25 mmol/L (ref 1.15–1.40)
Chloride: 104 mmol/L (ref 101–111)
Creatinine, Ser: 0.6 mg/dL (ref 0.44–1.00)
Glucose, Bld: 116 mg/dL — ABNORMAL HIGH (ref 65–99)
HCT: 45 % (ref 36.0–46.0)
Hemoglobin: 15.3 g/dL — ABNORMAL HIGH (ref 12.0–15.0)
Potassium: 3.6 mmol/L (ref 3.5–5.1)
Sodium: 143 mmol/L (ref 135–145)
TCO2: 28 mmol/L (ref 0–100)

## 2015-11-13 NOTE — ED Provider Notes (Signed)
CSN: [id number]     Arrival date & time 11/13/15  1237 History   None    Chief Complaint  Patient presents with  . Weakness  . Urinary Frequency   (Consider location/radiation/quality/duration/timing/severity/associated sxs/prior Treatment) 55 y.o. female presents with weakness, dizziness, and excessive thirst  X 1 month. Patient states that she was seen by her PCP and diagnosed with "prediabetes" patient states she as called in a prescription that her insurance would not approve and is requesting a generic prescription. Per patient and family prescription is unknown. Chart shows patient was seen on 9.14.17 for dysuria and increase frequency. No notation regarding medicine change/ refill.   Patient attempting to call [facility] to find out name of prescription.  Patient and family requesting a new prescription for aver active bladder.      Past Medical History:  Diagnosis Date  . ACHILLES TENDINITIS   . ALLERGIC RHINITIS   . GERD   . HYPERTENSION   . LATERAL EPICONDYLITIS, RIGHT   . LOW BACK PAIN   . Plantar fascial fibromatosis   . SUBACROMIAL BURSITIS, RIGHT   . Thoracic scoliosis 10/01/2015   Past Surgical History:  Procedure Laterality Date  . KIDNEY SURGERY    . TUBAL LIGATION     Family History  Problem Relation Age of Onset  . Cancer Sister     colon cancer  . Arthritis Other   . Diabetes Other   . Hypertension Other   . Cancer Other     breast cancer and prostate cancer  . Stroke Other    Social History  Substance Use Topics  . Smoking status: Current Every Day Smoker    Packs/day: 1.00    Types: Cigarettes  . Smokeless tobacco: Never Used  . Alcohol use No   OB History    No data available     Review of Systems  Allergies  Other and Lisinopril  Home Medications   Prior to Admission medications   Medication Sig Start Date End Date Taking? Authorizing Provider  ALPRAZolam (XANAX) 0.25 MG tablet Take 1 tablet (0.25 mg total) by mouth 2 (two) times  daily as needed for anxiety. 10/01/15  Yes Biagio Borg, MD  amLODipine (NORVASC) 5 MG tablet Take 1 tablet (5 mg total) by mouth daily. 10/01/15  Yes Biagio Borg, MD  albuterol (PROVENTIL HFA;VENTOLIN HFA) 108 (90 Base) MCG/ACT inhaler Inhale 2 puffs into the lungs every 6 (six) hours as needed for wheezing or shortness of breath. 10/01/15   Biagio Borg, MD  cyclobenzaprine (FLEXERIL) 10 MG tablet Take 1 tablet (10 mg total) by mouth 3 (three) times daily as needed for muscle spasms. 10/01/15   Biagio Borg, MD  desonide (DESOWEN) 0.05 % cream Apply topically 2 (two) times daily. 10/01/15   Biagio Borg, MD  fesoterodine (TOVIAZ) 4 MG TB24 tablet Take 1 tablet (4 mg total) by mouth daily. 11/11/15   Biagio Borg, MD  hydrochlorothiazide (HYDRODIURIL) 25 MG tablet Take 1 tablet (25 mg total) by mouth daily. 10/01/15   Biagio Borg, MD  HYDROcodone-acetaminophen (NORCO/VICODIN) 5-325 MG tablet 1 tab by mouth daily as needed Patient taking differently: Take 1 tablet by mouth every 4 (four) hours as needed for moderate pain. 1 tab by mouth daily as needed 10/01/15   Biagio Borg, MD  ibuprofen (ADVIL,MOTRIN) 800 MG tablet Take 1 tablet (800 mg total) by mouth every 8 (eight) hours as needed for moderate pain. 11/05/15   Jeneen Rinks  Quin Hoop, MD  oxyCODONE-acetaminophen (PERCOCET) 5-325 MG tablet Take 1 tablet by mouth every 4 (four) hours as needed for severe pain. 10/13/15   Daleen Bo, MD  pantoprazole (PROTONIX) 40 MG tablet Take 1 tablet (40 mg total) by mouth daily. 10/01/15   Biagio Borg, MD  potassium chloride (KLOR-CON 10) 10 MEQ tablet Take 1 tablet (10 mEq total) by mouth daily. 10/01/15   Biagio Borg, MD  sodium chloride (OCEAN) 0.65 % SOLN nasal spray Place 1-2 sprays into both nostrils daily as needed for congestion.    Historical Provider, MD   Meds Ordered and Administered this Visit  Medications - No data to display  BP 137/90 (BP Location: Left Arm)   Pulse 78   Temp 98.6 F (37 C)   Resp 16   LMP  08/22/2010   SpO2 100%  No data found.   Physical Exam  Urgent Care Course   Clinical Course    Procedures (including critical care time)  Labs Review Labs Reviewed  POCT URINALYSIS DIP (DEVICE) - Abnormal; Notable for the following:       Result Value   Hgb urine dipstick SMALL (*)    All other components within normal limits  POCT I-STAT, CHEM 8 - Abnormal; Notable for the following:    Glucose, Bld 116 (*)    Hemoglobin 15.3 (*)    All other components within normal limits    Imaging Review No results found.   Visual Acuity Review  Right Eye Distance:   Left Eye Distance:   Bilateral Distance:    Right Eye Near:   Left Eye Near:    Bilateral Near:       I state reviewed   MDM   1. Overactive bladder        Jacqualine Mau, NP 11/13/15 1535

## 2015-11-13 NOTE — ED Notes (Signed)
Pt d/c by Jennifer O, NP  

## 2015-11-13 NOTE — Discharge Instructions (Signed)
Call PCP for new overactive bladder medicine and to start on  prediabetic medicine

## 2015-11-13 NOTE — Assessment & Plan Note (Signed)
likely c/w OAB, Udip neg, afeb, exam benign, a1c normal, doubt stone related or recurrent uti, for toviaz asd, consider urology referral

## 2015-11-13 NOTE — Assessment & Plan Note (Signed)
stable overall by history and exam, recent data reviewed with pt, and pt to continue medical treatment as before,  to f/u any worsening symptoms or concerns BP Readings from Last 3 Encounters:  11/13/15 137/90  11/11/15 118/76  10/13/15 134/97

## 2015-11-13 NOTE — ED Triage Notes (Signed)
Pt reports weakness onset 1 month ++ associated w/dizziness, dry mouth, urinary freq and HA  Also reports she was seen at [facility] ED last month for kidney stones and pre-diabetes  A&O x4... NAD... Daughter at bedside.

## 2015-11-15 LAB — CULTURE, URINE COMPREHENSIVE

## 2015-11-16 ENCOUNTER — Other Ambulatory Visit: Payer: Self-pay | Admitting: Internal Medicine

## 2015-11-16 MED ORDER — CIPROFLOXACIN HCL 500 MG PO TABS: 500.0000 mg | ORAL_TABLET | Freq: Two times a day (BID) | ORAL | 0 refills | Status: DC

## 2015-11-17 ENCOUNTER — Encounter: Payer: Self-pay | Admitting: Internal Medicine

## 2015-11-17 ENCOUNTER — Ambulatory Visit (INDEPENDENT_AMBULATORY_CARE_PROVIDER_SITE_OTHER): Payer: BC Managed Care – PPO | Admitting: Internal Medicine

## 2015-11-17 ENCOUNTER — Other Ambulatory Visit (INDEPENDENT_AMBULATORY_CARE_PROVIDER_SITE_OTHER): Payer: BC Managed Care – PPO

## 2015-11-17 VITALS — BP 130/76 | HR 93 | Temp 98.0°F | Resp 20 | Wt 150.0 lb

## 2015-11-17 DIAGNOSIS — R739 Hyperglycemia, unspecified: Secondary | ICD-10-CM | POA: Diagnosis not present

## 2015-11-17 DIAGNOSIS — N3000 Acute cystitis without hematuria: Secondary | ICD-10-CM | POA: Diagnosis not present

## 2015-11-17 DIAGNOSIS — N3281 Overactive bladder: Secondary | ICD-10-CM

## 2015-11-17 DIAGNOSIS — R600 Localized edema: Secondary | ICD-10-CM | POA: Diagnosis not present

## 2015-11-17 DIAGNOSIS — Z0001 Encounter for general adult medical examination with abnormal findings: Secondary | ICD-10-CM

## 2015-11-17 LAB — HEMOGLOBIN A1C: Hgb A1c MFr Bld: 5.5 % (ref 4.6–6.5)

## 2015-11-17 MED ORDER — TOLTERODINE TARTRATE ER 4 MG PO CP24: 4.0000 mg | ORAL_CAPSULE | Freq: Every day | ORAL | 3 refills | Status: DC

## 2015-11-17 NOTE — Progress Notes (Signed)
Pre visit review using our clinic review tool, if applicable. No additional management support is needed unless otherwise documented below in the visit note. 

## 2015-11-17 NOTE — Patient Instructions (Addendum)
Please take all new medication as prescribed - the antibitoic, and the detrol LA for bladder  Please continue all other medications as before, and refills have been done if requested.  Please have the pharmacy call with any other refills you may need.  Please continue your efforts at being more active, low cholesterol diet, and weight control.  Please keep your appointments with your specialists as you may have planned  Please go to the LAB in the Basement (turn left off the elevator) for the tests to be done today  You will be contacted by phone if any changes need to be made immediately.  Otherwise, you will receive a letter about your results with an explanation, but please check with MyChart first.  Please remember to sign up for MyChart if you have not done so, as this will be important to you in the future with finding out test results, communicating by private email, and scheduling acute appointments online when needed.  Please return in 6 months, or sooner if needed, with Lab testing done 3-5 days before

## 2015-11-18 LAB — BASIC METABOLIC PANEL
BUN: 15 mg/dL (ref 6–23)
CO2: 23 mEq/L (ref 19–32)
Calcium: 9.3 mg/dL (ref 8.4–10.5)
Chloride: 111 mEq/L (ref 96–112)
Creatinine, Ser: 0.68 mg/dL (ref 0.40–1.20)
GFR: 115.16 mL/min (ref >60.00)
Glucose, Bld: 81 mg/dL (ref 70–99)
Potassium: 4.1 mEq/L (ref 3.5–5.1)
Sodium: 142 mEq/L (ref 135–145)

## 2015-11-20 NOTE — Assessment & Plan Note (Signed)
Improved,  to f/u any worsening symptoms or concerns 

## 2015-11-20 NOTE — Assessment & Plan Note (Signed)
Mild, for change toviaz to detrol asd,  to f/u any worsening symptoms or concerns

## 2015-11-20 NOTE — Progress Notes (Signed)
Subjective:    Patient ID: [name] Walker, female    DOB: August 28, 1960, 55 y.o.   MRN: [id number]  HPI  Here to f/u recent UTI and urinary freq otherwise for several months; has some freq and mild dysuria today, and denies urinary symptoms such as urgency, flank pain, hematuria or n/v, fever, chills. Pt denies chest pain, increased sob or doe, wheezing, orthopnea, PND, increased LE swelling, palpitations, dizziness or syncope.  Pt denies new neurological symptoms such as new headache, or facial or extremity weakness or numbness   Pt denies polydipsia, polyuria,.  Pt states overall good compliance with meds,  toviaz not covered by insurance Past Medical History:  Diagnosis Date  . ACHILLES TENDINITIS   . ALLERGIC RHINITIS   . GERD   . HYPERTENSION   . LATERAL EPICONDYLITIS, RIGHT   . LOW BACK PAIN   . Plantar fascial fibromatosis   . SUBACROMIAL BURSITIS, RIGHT   . Thoracic scoliosis 10/01/2015   Past Surgical History:  Procedure Laterality Date  . KIDNEY SURGERY    . TUBAL LIGATION      reports that she has been smoking Cigarettes.  She has been smoking about 1.00 pack per day. She has never used smokeless tobacco. She reports that she does not drink alcohol. Her drug history is not on file. family history includes Arthritis in her other; Cancer in her other and sister; Diabetes in her other; Hypertension in her other; Stroke in her other. Allergies  Allergen Reactions  . Other Palpitations    ALL MUSCLE RELAXERS-PER PATIENT  . Lisinopril Other (See Comments)    ? Possible tongue swelling    Current Outpatient Prescriptions on File Prior to Visit  Medication Sig Dispense Refill  . albuterol (PROVENTIL HFA;VENTOLIN HFA) 108 (90 Base) MCG/ACT inhaler Inhale 2 puffs into the lungs every 6 (six) hours as needed for wheezing or shortness of breath. 1 Inhaler 11  . ALPRAZolam (XANAX) 0.25 MG tablet Take 1 tablet (0.25 mg total) by mouth 2 (two) times daily as needed for anxiety. 40 tablet  1  . amLODipine (NORVASC) 5 MG tablet Take 1 tablet (5 mg total) by mouth daily. 90 tablet 3  . cyclobenzaprine (FLEXERIL) 10 MG tablet Take 1 tablet (10 mg total) by mouth 3 (three) times daily as needed for muscle spasms. 30 tablet 1  . desonide (DESOWEN) 0.05 % cream Apply topically 2 (two) times daily. 30 g 2  . fesoterodine (TOVIAZ) 4 MG TB24 tablet Take 1 tablet (4 mg total) by mouth daily. 90 tablet 3  . hydrochlorothiazide (HYDRODIURIL) 25 MG tablet Take 1 tablet (25 mg total) by mouth daily. 90 tablet 3  . HYDROcodone-acetaminophen (NORCO/VICODIN) 5-325 MG tablet 1 tab by mouth daily as needed (Patient taking differently: Take 1 tablet by mouth every 4 (four) hours as needed for moderate pain. 1 tab by mouth daily as needed) 30 tablet 0  . ibuprofen (ADVIL,MOTRIN) 800 MG tablet Take 1 tablet (800 mg total) by mouth every 8 (eight) hours as needed for moderate pain. 30 tablet 0  . oxyCODONE-acetaminophen (PERCOCET) 5-325 MG tablet Take 1 tablet by mouth every 4 (four) hours as needed for severe pain. 20 tablet 0  . pantoprazole (PROTONIX) 40 MG tablet Take 1 tablet (40 mg total) by mouth daily. 90 tablet 3  . potassium chloride (KLOR-CON 10) 10 MEQ tablet Take 1 tablet (10 mEq total) by mouth daily. 90 tablet 3  . sodium chloride (OCEAN) 0.65 % SOLN nasal spray Place  1-2 sprays into both nostrils daily as needed for congestion.     No current facility-administered medications on file prior to visit.     Review of Systems  Constitutional: Negative for unusual diaphoresis or night sweats HENT: Negative for ear swelling or discharge Eyes: Negative for worsening visual haziness  Respiratory: Negative for choking and stridor.   Gastrointestinal: Negative for distension or worsening eructation Genitourinary: Negative for retention or change in urine volume.  Musculoskeletal: Negative for other MSK pain or swelling Skin: Negative for color change and worsening wound Neurological: Negative  for tremors and numbness other than noted  Psychiatric/Behavioral: Negative for decreased concentration or agitation other than above       Objective:   Physical Exam BP 130/76   Pulse 93   Temp 98 F (36.7 C) (Oral)   Resp 20   Wt 150 lb (68 kg)   LMP 08/22/2010   SpO2 90%   BMI 29.29 kg/m  VS noted,  Constitutional: Pt appears in no apparent distress HENT: Head: NCAT.  Right Ear: External ear normal.  Left Ear: External ear normal.  Eyes: . Pupils are equal, round, and reactive to light. Conjunctivae and EOM are normal Neck: Normal range of motion. Neck supple.  Cardiovascular: Normal rate and regular rhythm.   Pulmonary/Chest: Effort normal and breath sounds without rales or wheezing.  Abd:  Soft, ND, + BS with mild low mid abd tender, no flank tender Neurological: Pt is alert. Not confused , motor grossly intact Skin: Skin is warm. No rash, no LE edema Psychiatric: Pt behavior is normal. No agitation.      Assessment & Plan:

## 2015-11-20 NOTE — Assessment & Plan Note (Signed)
stable overall by history and exam, recent data reviewed with pt, and pt to continue medical treatment as before,  to f/u any worsening symptoms or concerns Lab Results  Component Value Date   HGBA1C 5.5 11/17/2015

## 2015-11-20 NOTE — Assessment & Plan Note (Signed)
Mild to mod, for antibx course,  to f/u any worsening symptoms or concerns 

## 2015-12-10 ENCOUNTER — Other Ambulatory Visit: Payer: Self-pay | Admitting: Internal Medicine

## 2015-12-27 ENCOUNTER — Other Ambulatory Visit: Payer: Self-pay | Admitting: Internal Medicine

## 2016-01-08 ENCOUNTER — Emergency Department (HOSPITAL_COMMUNITY)
Admission: EM | Admit: 2016-01-08 | Discharge: 2016-01-08 | Disposition: A | Discharge status: Home / Self Care | Payer: BC Managed Care – PPO | Attending: Emergency Medicine | Admitting: Emergency Medicine

## 2016-01-08 ENCOUNTER — Encounter (HOSPITAL_COMMUNITY): Payer: Self-pay | Admitting: Emergency Medicine

## 2016-01-08 DIAGNOSIS — J45909 Unspecified asthma, uncomplicated: Secondary | ICD-10-CM | POA: Diagnosis not present

## 2016-01-08 DIAGNOSIS — J029 Acute pharyngitis, unspecified: Secondary | ICD-10-CM

## 2016-01-08 DIAGNOSIS — M436 Torticollis: Secondary | ICD-10-CM

## 2016-01-08 DIAGNOSIS — F17210 Nicotine dependence, cigarettes, uncomplicated: Secondary | ICD-10-CM | POA: Insufficient documentation

## 2016-01-08 DIAGNOSIS — I10 Essential (primary) hypertension: Secondary | ICD-10-CM | POA: Diagnosis not present

## 2016-01-08 DIAGNOSIS — Z79899 Other long term (current) drug therapy: Secondary | ICD-10-CM | POA: Diagnosis not present

## 2016-01-08 LAB — RAPID STREP SCREEN (MED CTR MEBANE ONLY): Streptococcus, Group A Screen (Direct): NEGATIVE

## 2016-01-08 MED ORDER — HYDROCODONE-ACETAMINOPHEN 5-325 MG PO TABS
1.0000 | ORAL_TABLET | Freq: Once | ORAL | Status: AC
  Administered 2016-01-08: 1 via ORAL
  Filled 2016-01-08: qty 1

## 2016-01-08 MED ORDER — METHOCARBAMOL 500 MG PO TABS: 500.0000 mg | ORAL_TABLET | Freq: Three times a day (TID) | ORAL | 0 refills | Status: DC | PRN

## 2016-01-08 MED ORDER — METHOCARBAMOL 500 MG PO TABS
1000.0000 mg | ORAL_TABLET | Freq: Once | ORAL | Status: AC
  Administered 2016-01-08: 1000 mg via ORAL
  Filled 2016-01-08: qty 2

## 2016-01-08 MED ORDER — NAPROXEN 500 MG PO TABS: 500.0000 mg | ORAL_TABLET | Freq: Two times a day (BID) | ORAL | 0 refills | Status: DC

## 2016-01-08 NOTE — Discharge Instructions (Signed)
Warm salt water gargles as needed.

## 2016-01-08 NOTE — ED Provider Notes (Signed)
[facility] DEPT Provider Note   CSN: [id number] Arrival date & time: 01/08/16  0830     History   Chief Complaint Chief Complaint  Patient presents with  . Sore Throat    HPI [name] Walker is a 55 y.o. female. She presents with a sore throat. She is here with her daughter who is a similar complaint. Patient's grandson had croup earlier this week. Patient denied fever. She complains of sore throat pain with swallowing. No shortness of breath. No sinus symptoms. Also states that she has some pain in the posterior aspect of her left neck and hurts to bend or twist her neck. She waking that way this morning. Sore throat for 2 days.  HPI  Past Medical History:  Diagnosis Date  . ACHILLES TENDINITIS   . ALLERGIC RHINITIS   . GERD   . HYPERTENSION   . LATERAL EPICONDYLITIS, RIGHT   . LOW BACK PAIN   . Plantar fascial fibromatosis   . SUBACROMIAL BURSITIS, RIGHT   . Thoracic scoliosis 10/01/2015    Patient Active Problem List   Diagnosis Date Noted  . Hyperglycemia 11/17/2015  . Angioedema 10/12/2015  . Bilateral lower extremity edema 10/12/2015  . Mouth sores 10/12/2015  . Hypokalemia 10/12/2015  . Thoracic scoliosis 10/01/2015  . Peripheral edema 10/01/2015  . Eczema 10/01/2015  . Asthma 10/01/2015  . Breast lump on right side at 4 o'clock position 01/21/2014  . Hot flashes 10/01/2013  . Grief reaction 07/17/2012  . Family history of colon cancer 07/02/2012  . OAB (overactive bladder) 03/01/2012  . Encounter for well adult exam with abnormal findings 08/23/2010  . Plantar fasciitis 08/23/2010  . UTI (urinary tract infection) 10/19/2008  . ANKLE PAIN, BILATERAL 10/19/2008  . VAGINITIS 09/10/2008  . JOINT EFFUSION, ANKLE 09/10/2008  . SUBACROMIAL BURSITIS, RIGHT 09/01/2008  . ACHILLES TENDINITIS 09/01/2008  . LATERAL EPICONDYLITIS, RIGHT 08/18/2008  . Essential hypertension 07/12/2007  . ALLERGIC RHINITIS 07/12/2007  . GERD 07/12/2007  . Chronic low back  pain 07/12/2007  . NEPHROLITHIASIS, HX OF 07/12/2007    Past Surgical History:  Procedure Laterality Date  . KIDNEY SURGERY    . TUBAL LIGATION      OB History    No data available       Home Medications    Prior to Admission medications   Medication Sig Start Date End Date Taking? Authorizing Provider  albuterol (PROVENTIL HFA;VENTOLIN HFA) 108 (90 Base) MCG/ACT inhaler Inhale 2 puffs into the lungs every 6 (six) hours as needed for wheezing or shortness of breath. 10/01/15   Biagio Borg, MD  ALPRAZolam Duanne Moron) 0.25 MG tablet Take 1 tablet (0.25 mg total) by mouth 2 (two) times daily as needed for anxiety. 10/01/15   Biagio Borg, MD  amLODipine (NORVASC) 5 MG tablet Take 1 tablet (5 mg total) by mouth daily. 10/01/15   Biagio Borg, MD  cyclobenzaprine (FLEXERIL) 10 MG tablet Take 1 tablet (10 mg total) by mouth 3 (three) times daily as needed for muscle spasms. 10/01/15   Biagio Borg, MD  desonide (DESOWEN) 0.05 % cream Apply topically 2 (two) times daily. 10/01/15   Biagio Borg, MD  fesoterodine (TOVIAZ) 4 MG TB24 tablet Take 1 tablet (4 mg total) by mouth daily. 11/11/15   Biagio Borg, MD  hydrochlorothiazide (HYDRODIURIL) 25 MG tablet Take 1 tablet (25 mg total) by mouth daily. 10/01/15   Biagio Borg, MD  HYDROcodone-acetaminophen (NORCO/VICODIN) 5-325 MG tablet 1 tab by  mouth daily as needed Patient taking differently: Take 1 tablet by mouth every 4 (four) hours as needed for moderate pain. 1 tab by mouth daily as needed 10/01/15   Biagio Borg, MD  ibuprofen (ADVIL,MOTRIN) 800 MG tablet TAKE ONE TABLET BY MOUTH EVERY 8 HOURS AS NEEDED FOR MODERATE PAIN 12/28/15   Biagio Borg, MD  methocarbamol (ROBAXIN) 500 MG tablet Take 1 tablet (500 mg total) by mouth 3 (three) times daily between meals as needed. 01/08/16   Tanna Furry, MD  naproxen (NAPROSYN) 500 MG tablet Take 1 tablet (500 mg total) by mouth 2 (two) times daily. 01/08/16   Tanna Furry, MD  oxyCODONE-acetaminophen (PERCOCET) 5-325  MG tablet Take 1 tablet by mouth every 4 (four) hours as needed for severe pain. 10/13/15   Daleen Bo, MD  pantoprazole (PROTONIX) 40 MG tablet Take 1 tablet (40 mg total) by mouth daily. 10/01/15   Biagio Borg, MD  potassium chloride (KLOR-CON 10) 10 MEQ tablet Take 1 tablet (10 mEq total) by mouth daily. 10/01/15   Biagio Borg, MD  sodium chloride (OCEAN) 0.65 % SOLN nasal spray Place 1-2 sprays into both nostrils daily as needed for congestion.    Historical Provider, MD  tolterodine (DETROL LA) 4 MG 24 hr capsule Take 1 capsule (4 mg total) by mouth daily. 11/17/15   Biagio Borg, MD    Family History Family History  Problem Relation Age of Onset  . Cancer Sister     colon cancer  . Arthritis Other   . Diabetes Other   . Hypertension Other   . Cancer Other     breast cancer and prostate cancer  . Stroke Other     Social History Social History  Substance Use Topics  . Smoking status: Current Every Day Smoker    Packs/day: 1.00    Types: Cigarettes  . Smokeless tobacco: Never Used  . Alcohol use No     Allergies   Other and Lisinopril   Review of Systems Review of Systems  Constitutional: Negative for appetite change, chills, diaphoresis, fatigue and fever.  HENT: Positive for sore throat. Negative for mouth sores and trouble swallowing.   Eyes: Negative for visual disturbance.  Respiratory: Negative for cough, chest tightness, shortness of breath and wheezing.   Cardiovascular: Negative for chest pain.  Gastrointestinal: Negative for abdominal distention, abdominal pain, diarrhea, nausea and vomiting.  Endocrine: Negative for polydipsia, polyphagia and polyuria.  Genitourinary: Negative for dysuria, frequency and hematuria.  Musculoskeletal: Positive for neck pain. Negative for gait problem.  Skin: Negative for color change, pallor and rash.  Neurological: Negative for dizziness, syncope, light-headedness and headaches.  Hematological: Does not bruise/bleed easily.    Psychiatric/Behavioral: Negative for behavioral problems and confusion.     Physical Exam Updated Vital Signs BP 142/92 (BP Location: Left Arm)   Pulse 79   Temp 98.1 F (36.7 C) (Oral)   Resp 16   LMP 08/22/2010   SpO2 100%   Physical Exam  Constitutional: She is oriented to person, place, and time. She appears well-developed and well-nourished. No distress.  HENT:  Head: Normocephalic.  Mouth/Throat: Oropharynx is clear and moist. No oropharyngeal exudate.  Eyes: Conjunctivae are normal. Pupils are equal, round, and reactive to light. No scleral icterus.  Neck: Normal range of motion. Neck supple. No thyromegaly present.    Tenderness and pain with range of motion to the left paraspinal muscular 2. No meningismus.  Cardiovascular: Normal rate and regular rhythm.  Exam reveals no gallop and no friction rub.   No murmur heard. Pulmonary/Chest: Effort normal and breath sounds normal. No respiratory distress. She has no wheezes. She has no rales.  Abdominal: Soft. Bowel sounds are normal. She exhibits no distension. There is no tenderness. There is no rebound.  Musculoskeletal: Normal range of motion.  Neurological: She is alert and oriented to person, place, and time.  Skin: Skin is warm and dry. No rash noted.  Psychiatric: She has a normal mood and affect. Her behavior is normal.     ED Treatments / Results  Labs (all labs ordered are listed, but only abnormal results are displayed) Labs Reviewed  RAPID STREP SCREEN (NOT AT [facility])  CULTURE, GROUP A STREP [facility])    EKG  EKG Interpretation None       Radiology No results found.  Procedures Procedures (including critical care time)  Medications Ordered in ED Medications  methocarbamol (ROBAXIN) tablet 1,000 mg (1,000 mg Oral Given 01/08/16 0916)  HYDROcodone-acetaminophen (NORCO/VICODIN) 5-325 MG per tablet 1 tablet (1 tablet Oral Given 01/08/16 0916)     Initial Impression / Assessment and Plan / ED  Course  I have reviewed the triage vital signs and the nursing notes.  Pertinent labs & imaging results that were available during my care of the patient were reviewed by me and considered in my medical decision making (see chart for details).  Clinical Course     Given Robaxin here. No strep throat. Color gargles as needed Tylenol as needed. Likely viral pharyngitis  Final Clinical Impressions(s) / ED Diagnoses   Final diagnoses:  Viral pharyngitis  Torticollis    New Prescriptions New Prescriptions   METHOCARBAMOL (ROBAXIN) 500 MG TABLET    Take 1 tablet (500 mg total) by mouth 3 (three) times daily between meals as needed.   NAPROXEN (NAPROSYN) 500 MG TABLET    Take 1 tablet (500 mg total) by mouth 2 (two) times daily.     Tanna Furry, MD 01/08/16 1052

## 2016-01-08 NOTE — ED Triage Notes (Signed)
Per patient, states sore throat since Wednesday-thinks it might be strep

## 2016-01-09 ENCOUNTER — Encounter (HOSPITAL_COMMUNITY): Payer: Self-pay | Admitting: Oncology

## 2016-01-09 ENCOUNTER — Emergency Department (HOSPITAL_COMMUNITY)
Admission: EM | Admit: 2016-01-09 | Discharge: 2016-01-09 | Disposition: A | Discharge status: Home / Self Care | Payer: BC Managed Care – PPO | Attending: Emergency Medicine | Admitting: Emergency Medicine

## 2016-01-09 DIAGNOSIS — J45909 Unspecified asthma, uncomplicated: Secondary | ICD-10-CM | POA: Diagnosis not present

## 2016-01-09 DIAGNOSIS — Z79899 Other long term (current) drug therapy: Secondary | ICD-10-CM | POA: Insufficient documentation

## 2016-01-09 DIAGNOSIS — F17210 Nicotine dependence, cigarettes, uncomplicated: Secondary | ICD-10-CM | POA: Insufficient documentation

## 2016-01-09 DIAGNOSIS — M542 Cervicalgia: Secondary | ICD-10-CM

## 2016-01-09 DIAGNOSIS — I10 Essential (primary) hypertension: Secondary | ICD-10-CM | POA: Insufficient documentation

## 2016-01-09 MED ORDER — KETOROLAC TROMETHAMINE 60 MG/2ML IM SOLN
60.0000 mg | Freq: Once | INTRAMUSCULAR | Status: AC
  Administered 2016-01-09: 60 mg via INTRAMUSCULAR
  Filled 2016-01-09: qty 2

## 2016-01-09 MED ORDER — METHOCARBAMOL 500 MG PO TABS
1000.0000 mg | ORAL_TABLET | Freq: Once | ORAL | Status: AC
  Administered 2016-01-09: 1000 mg via ORAL
  Filled 2016-01-09: qty 2

## 2016-01-09 NOTE — ED Provider Notes (Signed)
[facility] DEPT Provider Note   CSN: [id number] Arrival date & time: 01/09/16  [id number]     History   Chief Complaint Chief Complaint  Patient presents with  . Neck Pain    HPI [name] Walker is a 55 y.o. female.  HPI   [name] Walker is a 55 y.o. female, with a history of Chronic neck and back pain and HTN, presenting to the ED with acute on chronic neck pain for the past 5 days. Pt was seen yesterday in the ED, mainly for a sore throat, but also complaining of neck pain. Sore throat has resolved. Patient was given a robaxin and states this hasn't helped the neck pain. Pt has also tried ibuprofen with some relief. Pain is moderate, sharp/tight, radiating into the back of the head. Denies fever/chills, difficulty breathing/swallowing, N/V, neck stiffness, or any other complaints.     Past Medical History:  Diagnosis Date  . ACHILLES TENDINITIS   . ALLERGIC RHINITIS   . GERD   . HYPERTENSION   . LATERAL EPICONDYLITIS, RIGHT   . LOW BACK PAIN   . Plantar fascial fibromatosis   . SUBACROMIAL BURSITIS, RIGHT   . Thoracic scoliosis 10/01/2015    Patient Active Problem List   Diagnosis Date Noted  . Hyperglycemia 11/17/2015  . Angioedema 10/12/2015  . Bilateral lower extremity edema 10/12/2015  . Mouth sores 10/12/2015  . Hypokalemia 10/12/2015  . Thoracic scoliosis 10/01/2015  . Peripheral edema 10/01/2015  . Eczema 10/01/2015  . Asthma 10/01/2015  . Breast lump on right side at 4 o'clock position 01/21/2014  . Hot flashes 10/01/2013  . Grief reaction 07/17/2012  . Family history of colon cancer 07/02/2012  . OAB (overactive bladder) 03/01/2012  . Encounter for well adult exam with abnormal findings 08/23/2010  . Plantar fasciitis 08/23/2010  . UTI (urinary tract infection) 10/19/2008  . ANKLE PAIN, BILATERAL 10/19/2008  . VAGINITIS 09/10/2008  . JOINT EFFUSION, ANKLE 09/10/2008  . SUBACROMIAL BURSITIS, RIGHT 09/01/2008  . ACHILLES TENDINITIS 09/01/2008  .  LATERAL EPICONDYLITIS, RIGHT 08/18/2008  . Essential hypertension 07/12/2007  . ALLERGIC RHINITIS 07/12/2007  . GERD 07/12/2007  . Chronic low back pain 07/12/2007  . NEPHROLITHIASIS, HX OF 07/12/2007    Past Surgical History:  Procedure Laterality Date  . KIDNEY SURGERY    . TUBAL LIGATION      OB History    No data available       Home Medications    Prior to Admission medications   Medication Sig Start Date End Date Taking? Authorizing Provider  albuterol (PROVENTIL HFA;VENTOLIN HFA) 108 (90 Base) MCG/ACT inhaler Inhale 2 puffs into the lungs every 6 (six) hours as needed for wheezing or shortness of breath. 10/01/15   Biagio Borg, MD  ALPRAZolam Duanne Moron) 0.25 MG tablet Take 1 tablet (0.25 mg total) by mouth 2 (two) times daily as needed for anxiety. 10/01/15   Biagio Borg, MD  amLODipine (NORVASC) 5 MG tablet Take 1 tablet (5 mg total) by mouth daily. 10/01/15   Biagio Borg, MD  cyclobenzaprine (FLEXERIL) 10 MG tablet Take 1 tablet (10 mg total) by mouth 3 (three) times daily as needed for muscle spasms. 10/01/15   Biagio Borg, MD  desonide (DESOWEN) 0.05 % cream Apply topically 2 (two) times daily. 10/01/15   Biagio Borg, MD  fesoterodine (TOVIAZ) 4 MG TB24 tablet Take 1 tablet (4 mg total) by mouth daily. 11/11/15   Biagio Borg, MD  hydrochlorothiazide (HYDRODIURIL)  25 MG tablet Take 1 tablet (25 mg total) by mouth daily. 10/01/15   Biagio Borg, MD  HYDROcodone-acetaminophen (NORCO/VICODIN) 5-325 MG tablet 1 tab by mouth daily as needed Patient taking differently: Take 1 tablet by mouth every 4 (four) hours as needed for moderate pain. 1 tab by mouth daily as needed 10/01/15   Biagio Borg, MD  ibuprofen (ADVIL,MOTRIN) 800 MG tablet TAKE ONE TABLET BY MOUTH EVERY 8 HOURS AS NEEDED FOR MODERATE PAIN 12/28/15   Biagio Borg, MD  methocarbamol (ROBAXIN) 500 MG tablet Take 1 tablet (500 mg total) by mouth 3 (three) times daily between meals as needed. 01/08/16   Tanna Furry, MD  naproxen  (NAPROSYN) 500 MG tablet Take 1 tablet (500 mg total) by mouth 2 (two) times daily. 01/08/16   Tanna Furry, MD  oxyCODONE-acetaminophen (PERCOCET) 5-325 MG tablet Take 1 tablet by mouth every 4 (four) hours as needed for severe pain. 10/13/15   Daleen Bo, MD  pantoprazole (PROTONIX) 40 MG tablet Take 1 tablet (40 mg total) by mouth daily. 10/01/15   Biagio Borg, MD  potassium chloride (KLOR-CON 10) 10 MEQ tablet Take 1 tablet (10 mEq total) by mouth daily. 10/01/15   Biagio Borg, MD  sodium chloride (OCEAN) 0.65 % SOLN nasal spray Place 1-2 sprays into both nostrils daily as needed for congestion.    Historical Provider, MD  tolterodine (DETROL LA) 4 MG 24 hr capsule Take 1 capsule (4 mg total) by mouth daily. 11/17/15   Biagio Borg, MD    Family History Family History  Problem Relation Age of Onset  . Cancer Sister     colon cancer  . Arthritis Other   . Diabetes Other   . Hypertension Other   . Cancer Other     breast cancer and prostate cancer  . Stroke Other     Social History Social History  Substance Use Topics  . Smoking status: Current Every Day Smoker    Packs/day: 1.00    Types: Cigarettes  . Smokeless tobacco: Never Used  . Alcohol use No     Allergies   Other and Lisinopril   Review of Systems Review of Systems  Constitutional: Negative for chills and fever.  Respiratory: Negative for shortness of breath.   Gastrointestinal: Negative for nausea and vomiting.  Musculoskeletal: Positive for neck pain. Negative for neck stiffness.  Neurological: Negative for dizziness, weakness, light-headedness, numbness and headaches.  All other systems reviewed and are negative.    Physical Exam Updated Vital Signs BP 152/100 (BP Location: Right Arm)   Pulse 80   Temp 98 F (36.7 C) (Oral)   Resp 18   Ht 5\' 2"  (1.575 m)   Wt 72.6 kg   LMP 08/22/2010   SpO2 99%   BMI 29.26 kg/m   Physical Exam  Constitutional: She appears well-developed and well-nourished. No  distress.  HENT:  Head: Normocephalic and atraumatic.  Mouth/Throat: Oropharynx is clear and moist.  Eyes: Conjunctivae and EOM are normal. Pupils are equal, round, and reactive to light.  Neck: Normal range of motion. Neck supple.  Cardiovascular: Normal rate, regular rhythm, normal heart sounds and intact distal pulses.   Pulmonary/Chest: Effort normal and breath sounds normal. No respiratory distress.  Abdominal: Soft. There is no tenderness. There is no guarding.  Musculoskeletal: She exhibits no edema.  Tenderness to the bilateral cervical musculature flanking the spine, extending into the bilateral occipital region. Normal motor function intact in all extremities  and spine. No midline spinal tenderness.   Lymphadenopathy:    She has no cervical adenopathy.  Neurological: She is alert.  No sensory deficits. Strength 5/5 in all extremities. No gait disturbance. Coordination intact. Cranial nerves III-XII grossly intact. No facial droop.   Skin: Skin is warm and dry. Capillary refill takes less than 2 seconds. She is not diaphoretic.  Psychiatric: She has a normal mood and affect. Her behavior is normal.  Nursing note and vitals reviewed.    ED Treatments / Results  Labs (all labs ordered are listed, but only abnormal results are displayed) Labs Reviewed - No data to display  EKG  EKG Interpretation None       Radiology No results found.  Procedures Procedures (including critical care time)  Medications Ordered in ED Medications  ketorolac (TORADOL) injection 60 mg (60 mg Intramuscular Given 01/09/16 0811)  methocarbamol (ROBAXIN) tablet 1,000 mg (1,000 mg Oral Given 01/09/16 0811)     Initial Impression / Assessment and Plan / ED Course  I have reviewed the triage vital signs and the nursing notes.  Pertinent labs & imaging results that were available during my care of the patient were reviewed by me and considered in my medical decision making (see chart for  details).  Clinical Course     Patient presents with acute on chronic neck pain beginning a few days ago. Patient was treated for this yesterday. Believe she has not given the therapy time to work. Patient has no neuro or functional deficits. No signs of meningismus or infection. PCP follow-up. The patient was given instructions for home care as well as return precautions. Patient voices understanding of these instructions, accepts the plan, and is comfortable with discharge.   Vitals:   01/09/16 0702 01/09/16 0854  BP: 152/100 (!) 170/106  Pulse: 80 74  Resp: 18 16  Temp: 98 F (36.7 C)   TempSrc: Oral   SpO2: 99% 98%  Weight: 72.6 kg   Height: 5\' 2"  (1.575 m)       Final Clinical Impressions(s) / ED Diagnoses   Final diagnoses:  Neck pain    New Prescriptions Discharge Medication List as of 01/09/2016  8:41 AM       Lorayne Bender, PA-C 01/09/16 0914    Tanna Furry, MD 01/26/16 1040

## 2016-01-09 NOTE — Discharge Instructions (Signed)
Take it easy, but do not lay around too much as this may make the stiffness worse. Take 500 mg of naproxen every 12 hours or 800 mg of ibuprofen every 8 hours for the next 3 days. Take these medications with food to avoid upset stomach. Robaxin is a muscle relaxer and may help loosen stiff muscles. Do not take the Robaxin while driving or performing other dangerous activities. Be sure to perform the attached exercises starting with three times a week and working up to performing them daily. This is an essential part of preventing long term problems. Follow up with a primary care provider for any future management of these complaints. Please note that you are not being diagnosed with a cervical sprain, the attached documentation has only been included for the sake of the exercises.

## 2016-01-09 NOTE — ED Triage Notes (Signed)
Pt presents d/t neck pain that radiates into her head.  Pt denies injury.  States she was seen here for the same and given muscle relaxer that she has been taking w/o relief.  Rates pain 10/10.

## 2016-01-10 LAB — CULTURE, GROUP A STREP (THRC)

## 2016-01-11 ENCOUNTER — Ambulatory Visit (INDEPENDENT_AMBULATORY_CARE_PROVIDER_SITE_OTHER): Payer: BC Managed Care – PPO | Admitting: Family

## 2016-01-11 ENCOUNTER — Encounter: Payer: Self-pay | Admitting: Family

## 2016-01-11 DIAGNOSIS — M5030 Other cervical disc degeneration, unspecified cervical region: Secondary | ICD-10-CM | POA: Diagnosis not present

## 2016-01-11 MED ORDER — METHOCARBAMOL 750 MG PO TABS: 750.0000 mg | ORAL_TABLET | Freq: Three times a day (TID) | ORAL | 0 refills | Status: DC

## 2016-01-11 MED ORDER — ACETAMINOPHEN-CODEINE #3 300-30 MG PO TABS: 1.0000 | ORAL_TABLET | Freq: Three times a day (TID) | ORAL | 0 refills | Status: DC | PRN

## 2016-01-11 NOTE — Assessment & Plan Note (Signed)
Symptoms and exam consistent with acute on chronic muscle spasms associated with degenerative disc disease exacerbation. Increase Robaxin. Start Tylenol 3 as needed for pain. Initiate home exercise therapy with ice/moist heat. Continue over-the-counter medications as needed for symptom relief and supportive care. Follow-up if symptoms worsen or do not improve for additional imaging and therapy if indicated.

## 2016-01-11 NOTE — Patient Instructions (Addendum)
Thank you for choosing [facility].  SUMMARY AND INSTRUCTIONS:  Ice/moist heat 20 minutes every 2 hours as needed and following activity.  Stretches and exercises multiple times throughout the day.  Continue ibuprofen 800 mg 3 times daily as needed.  Tylenol 3 as needed for pain uncontrolled by ibuprofen.  If your symptoms worsen or do not improve please let us know and we'll schedule an MRI and physical therapy.   Medication:  Your prescription(s) have been submitted to your pharmacy or been printed and provided for you. Please take as directed and contact our office if you believe you are having problem(s) with the medication(s) or have any questions.   Follow up:  If your symptoms worsen or fail to improve, please contact our office for further instruction, or in case of emergency go directly to the emergency room at the closest medical facility.     Cervical Strain and Sprain Rehab Ask your health care provider which exercises are safe for you. Do exercises exactly as told by your health care provider and adjust them as directed. It is normal to feel mild stretching, pulling, tightness, or discomfort as you do these exercises, but you should stop right away if you feel sudden pain or your pain gets worse.Do not begin these exercises until told by your health care provider. Stretching and range of motion exercises These exercises warm up your muscles and joints and improve the movement and flexibility of your neck. These exercises also help to relieve pain, numbness, and tingling. Exercise A: Cervical side bend 1. Using good posture, sit on a stable chair or stand up. 2. Without moving your shoulders, slowly tilt your left / right ear to your shoulder until you feel a stretch in your neck muscles. You should be looking straight ahead. 3. Hold for __________ seconds. 4. Repeat with the other side of your neck. Repeat __________ times. Complete this exercise __________  times a day. Exercise B: Cervical rotation 1. Using good posture, sit on a stable chair or stand up. 2. Slowly turn your head to the side as if you are looking over your left / right shoulder.  Keep your eyes level with the ground.  Stop when you feel a stretch along the side and the back of your neck. 3. Hold for __________ seconds. 4. Repeat this by turning to your other side. Repeat __________ times. Complete this exercise __________ times a day. Exercise C: Thoracic extension and pectoral stretch 1. Roll a towel or a small blanket so it is about 4 inches (10 cm) in diameter. 2. Lie down on your back on a firm surface. 3. Put the towel lengthwise, under your spine in the middle of your back. It should not be not under your shoulder blades. The towel should line up with your spine from your middle back to your lower back. 4. Put your hands behind your head and let your elbows fall out to your sides. 5. Hold for __________ seconds. Repeat __________ times. Complete this exercise __________ times a day. Strengthening exercises These exercises build strength and endurance in your neck. Endurance is the ability to use your muscles for a long time, even after your muscles get tired. Exercise D: Upper cervical flexion, isometric 1. Lie on your back with a thin pillow behind your head and a small rolled-up towel under your neck. 2. Gently tuck your chin toward your chest and nod your head down to look toward your feet. Do not lift your head off  the pillow. 3. Hold for __________ seconds. 4. Release the tension slowly. Relax your neck muscles completely before you repeat this exercise. Repeat __________ times. Complete this exercise __________ times a day. Exercise E: Cervical extension, isometric 1. Stand about 6 inches (15 cm) away from a wall, with your back facing the wall. 2. Place a soft object, about 6-8 inches (15-20 cm) in diameter, between the back of your head and the wall. A soft  object could be a small pillow, a ball, or a folded towel. 3. Gently tilt your head back and press into the soft object. Keep your jaw and forehead relaxed. 4. Hold for __________ seconds. 5. Release the tension slowly. Relax your neck muscles completely before you repeat this exercise. Repeat __________ times. Complete this exercise __________ times a day. Posture and body mechanics   Body mechanics refers to the movements and positions of your body while you do your daily activities. Posture is part of body mechanics. Good posture and healthy body mechanics can help to relieve stress in your body's tissues and joints. Good posture means that your spine is in its natural S-curve position (your spine is neutral), your shoulders are pulled back slightly, and your head is not tipped forward. The following are general guidelines for applying improved posture and body mechanics to your everyday activities. Standing  When standing, keep your spine neutral and keep your feet about hip-width apart. Keep a slight bend in your knees. Your ears, shoulders, and hips should line up.  When you do a task in which you stand in one place for a long time, place one foot up on a stable object that is 2-4 inches (5-10 cm) high, such as a footstool. This helps keep your spine neutral. Sitting  When sitting, keep your spine neutral and your keep feet flat on the floor. Use a footrest, if necessary, and keep your thighs parallel to the floor. Avoid rounding your shoulders, and avoid tilting your head forward.  When working at a desk or a computer, keep your desk at a height where your hands are slightly lower than your elbows. Slide your chair under your desk so you are close enough to maintain good posture.  When working at a computer, place your monitor at a height where you are looking straight ahead and you do not have to tilt your head forward or downward to look at the screen. Resting When lying down and  resting, avoid positions that are most painful for you. Try to support your neck in a neutral position. You can use a contour pillow or a small rolled-up towel. Your pillow should support your neck but not push on it. This information is not intended to replace advice given to you by your health care provider. Make sure you discuss any questions you have with your health care provider. Document Released: 02/13/2005 Document Revised: 10/21/2015 Document Reviewed: 01/20/2015 Elsevier Interactive Patient Education  2017 [employer].

## 2016-01-11 NOTE — Progress Notes (Signed)
Subjective:    Patient ID: [name] Walker, female    DOB: 1960/04/10, 55 y.o.   MRN: [id number]  Chief Complaint  Patient presents with  . Hospitalization Follow-up    was told in ER on sunday that she has muscle spasms in her neck, still hurts so bad she can not go to work, the pain in shooting into the back of her head and causing ear pain    HPI:  [name] Walker is a 55 y.o. female who  has a past medical history of ACHILLES TENDINITIS; ALLERGIC RHINITIS; GERD; HYPERTENSION; LATERAL EPICONDYLITIS, RIGHT; LOW BACK PAIN; Plantar fascial fibromatosis; SUBACROMIAL BURSITIS, RIGHT; and Thoracic scoliosis (10/01/2015). and presents today for an acute office follow up.   Radiographs in 2009 showed degenerative disc disease most notably at C4-C5 and C5-6. Seen in the ED 2 days ago with the chief compliant of neck pain and diagnosed with acute on chronic neck pain and treated with a Toradol injection and started on Robaxin. Reports taking the Robaxin as prescribed and denies adverse side effects. Notes the medications have not helped very much. The toradol did provide some relief but was short lived. Has also tried ice and a heating pad. Continues to experience the associated symptom of pain located located in middle and left side of her neck and described as sharp. No radiculopathy or weakness. Denies any new trauma or injury. There is motion restriction include lateral bending. 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   Allergies  Allergen Reactions  . Other Palpitations    ALL MUSCLE RELAXERS-PER PATIENT  . Lisinopril Other (See Comments)    ? Possible tongue swelling       Outpatient Medications Prior to Visit  Medication Sig Dispense Refill  . albuterol (PROVENTIL HFA;VENTOLIN HFA) 108 (90 Base) MCG/ACT inhaler Inhale 2 puffs into the lungs every 6 (six) hours as needed for wheezing or shortness of breath. 1 Inhaler 11  . ALPRAZolam  (XANAX) 0.25 MG tablet Take 1 tablet (0.25 mg total) by mouth 2 (two) times daily as needed for anxiety. 40 tablet 1  . amLODipine (NORVASC) 5 MG tablet Take 1 tablet (5 mg total) by mouth daily. 90 tablet 3  . desonide (DESOWEN) 0.05 % cream Apply topically 2 (two) times daily. 30 g 2  . fesoterodine (TOVIAZ) 4 MG TB24 tablet Take 1 tablet (4 mg total) by mouth daily. 90 tablet 3  . hydrochlorothiazide (HYDRODIURIL) 25 MG tablet Take 1 tablet (25 mg total) by mouth daily. 90 tablet 3  . ibuprofen (ADVIL,MOTRIN) 800 MG tablet TAKE ONE TABLET BY MOUTH EVERY 8 HOURS AS NEEDED FOR MODERATE PAIN 30 tablet 0  . pantoprazole (PROTONIX) 40 MG tablet Take 1 tablet (40 mg total) by mouth daily. 90 tablet 3  . potassium chloride (KLOR-CON 10) 10 MEQ tablet Take 1 tablet (10 mEq total) by mouth daily. 90 tablet 3  . sodium chloride (OCEAN) 0.65 % SOLN nasal spray Place 1-2 sprays into both nostrils daily as needed for congestion.    Marland Kitchen tolterodine (DETROL LA) 4 MG 24 hr capsule Take 1 capsule (4 mg total) by mouth daily. 90 capsule 3  . cyclobenzaprine (FLEXERIL) 10 MG tablet Take 1 tablet (10 mg total) by mouth 3 (three) times daily as needed for muscle spasms. 30 tablet 1  . methocarbamol (ROBAXIN) 500 MG tablet Take 1 tablet (500 mg total) by mouth 3 (three) times  daily between meals as needed. 20 tablet 0  . naproxen (NAPROSYN) 500 MG tablet Take 1 tablet (500 mg total) by mouth 2 (two) times daily. 30 tablet 0  . HYDROcodone-acetaminophen (NORCO/VICODIN) 5-325 MG tablet 1 tab by mouth daily as needed (Patient taking differently: Take 1 tablet by mouth every 4 (four) hours as needed for moderate pain. 1 tab by mouth daily as needed) 30 tablet 0  . oxyCODONE-acetaminophen (PERCOCET) 5-325 MG tablet Take 1 tablet by mouth every 4 (four) hours as needed for severe pain. 20 tablet 0   No facility-administered medications prior to visit.     Review of Systems  Constitutional: Negative for chills and fever.    Musculoskeletal: Positive for neck pain and neck stiffness.  Neurological: Negative for weakness and numbness.      Objective:    BP (!) 142/94 (BP Location: Left Arm, Patient Position: Sitting, Cuff Size: Normal)   Pulse 84   Temp 97.7 F (36.5 C) (Oral)   Resp 16   Ht 5\' 2"  (1.575 m)   Wt 152 lb (68.9 kg)   LMP 08/22/2010   SpO2 94%   BMI 27.80 kg/m  Nursing note and vital signs reviewed.  Physical Exam  Constitutional: She is oriented to person, place, and time. She appears well-developed and well-nourished. No distress.  Neck:  No obvious deformity, discoloration, or edema. There is tenderness and muscle spasm of the bilateral upper trapezius muscle groups. Range of motion is limited in lateral bending and rotation. All other motions are normal. Distal pulses and sensation are intact and appropriate. Negative cervical compression and negative Spurling's.  Cardiovascular: Normal rate, regular rhythm, normal heart sounds and intact distal pulses.   Pulmonary/Chest: Effort normal and breath sounds normal.  Neurological: She is alert and oriented to person, place, and time.  Skin: Skin is warm and dry.  Psychiatric: She has a normal mood and affect. Her behavior is normal. Judgment and thought content normal.       Assessment & Plan:   Problem List Items Addressed This Visit      Musculoskeletal and Integument   Degenerative disc disease, cervical    Symptoms and exam consistent with acute on chronic muscle spasms associated with degenerative disc disease exacerbation. Increase Robaxin. Start Tylenol 3 as needed for pain. Initiate home exercise therapy with ice/moist heat. Continue over-the-counter medications as needed for symptom relief and supportive care. Follow-up if symptoms worsen or do not improve for additional imaging and therapy if indicated.       Relevant Medications   methocarbamol (ROBAXIN) 750 MG tablet   acetaminophen-codeine (TYLENOL #3) 300-30 MG tablet        I have discontinued [name]'s cyclobenzaprine, HYDROcodone-acetaminophen, oxyCODONE-acetaminophen, methocarbamol, and naproxen. I am also having her start on methocarbamol and acetaminophen-codeine. Additionally, I am having her maintain her sodium chloride, albuterol, ALPRAZolam, amLODipine, pantoprazole, desonide, hydrochlorothiazide, potassium chloride, fesoterodine, tolterodine, and ibuprofen.   Meds ordered this encounter  Medications  . methocarbamol (ROBAXIN) 750 MG tablet    Sig: Take 1 tablet (750 mg total) by mouth 3 (three) times daily.    Dispense:  30 tablet    Refill:  0    Order Specific Question:   Supervising Provider    Answer:   Pricilla Holm A [id number]  . acetaminophen-codeine (TYLENOL #3) 300-30 MG tablet    Sig: Take 1 tablet by mouth every 8 (eight) hours as needed for moderate pain.    Dispense:  60 tablet  Refill:  0    Order Specific Question:   Supervising Provider    Answer:   Pricilla Holm A [id number]     Follow-up: Return if symptoms worsen or fail to improve.  Mauricio Po, FNP

## 2016-01-14 ENCOUNTER — Telehealth: Payer: Self-pay | Admitting: Internal Medicine

## 2016-01-14 DIAGNOSIS — M5030 Other cervical disc degeneration, unspecified cervical region: Secondary | ICD-10-CM

## 2016-01-14 NOTE — Telephone Encounter (Signed)
Patient states that she would like to proceed with MRI as discussed at her previous visit,.

## 2016-01-16 NOTE — Telephone Encounter (Signed)
MRI order placed and she be hearing back soon.

## 2016-01-21 ENCOUNTER — Telehealth: Payer: Self-pay | Admitting: Internal Medicine

## 2016-01-21 ENCOUNTER — Other Ambulatory Visit: Payer: Self-pay | Admitting: Family

## 2016-01-21 DIAGNOSIS — M5030 Other cervical disc degeneration, unspecified cervical region: Secondary | ICD-10-CM

## 2016-01-21 MED ORDER — ACETAMINOPHEN-CODEINE #3 300-30 MG PO TABS: 1.0000 | ORAL_TABLET | Freq: Three times a day (TID) | ORAL | 0 refills | Status: DC | PRN

## 2016-01-21 MED ORDER — METHOCARBAMOL 750 MG PO TABS: 750.0000 mg | ORAL_TABLET | Freq: Three times a day (TID) | ORAL | 0 refills | Status: DC

## 2016-01-21 NOTE — Telephone Encounter (Signed)
Pt called about needing a refill for methocarbamol (ROBAXIN) 750 MG tablet and acetaminophen-codeine (TYLENOL #3) 300-30 MG tablet. I did advise to pt that Dr Jenny Reichmann might not refill those rx due to another doctor witting them. Please advise? Thank you!

## 2016-01-21 NOTE — Telephone Encounter (Signed)
Left detailed message for pt. rx faxed to [facility] on [address]

## 2016-01-21 NOTE — Telephone Encounter (Signed)
Done hardcopy to stafanie - tylenol #3  Robaxin done erx  Please let pt know due to the nature of this medication, she should have OV for further refills

## 2016-01-26 ENCOUNTER — Other Ambulatory Visit: Payer: Self-pay | Admitting: Internal Medicine

## 2016-01-26 ENCOUNTER — Telehealth: Payer: Self-pay | Admitting: Internal Medicine

## 2016-01-26 NOTE — Telephone Encounter (Signed)
Pt's MRI Cervical Spine was not pre authorized. The reason they gave was   "Advanced imaging may be appropriate if cervical spine x-ray has been performed and does not explain the cause of the patients symptoms."  Please advise

## 2016-01-26 NOTE — Telephone Encounter (Signed)
I assume so because I did answer that she has already been diagnosed with dengenerative disk disease

## 2016-01-26 NOTE — Telephone Encounter (Signed)
As I did not order this, I will forward to [facility], thanks

## 2016-01-26 NOTE — Telephone Encounter (Signed)
She has had an x-ray in 2009 showing dengenerative disk disease. Do I need to repeat again?

## 2016-01-27 ENCOUNTER — Ambulatory Visit (INDEPENDENT_AMBULATORY_CARE_PROVIDER_SITE_OTHER): Payer: BC Managed Care – PPO | Admitting: Internal Medicine

## 2016-01-27 ENCOUNTER — Encounter: Payer: Self-pay | Admitting: Internal Medicine

## 2016-01-27 VITALS — BP 140/80 | HR 91 | Resp 20 | Wt 152.0 lb

## 2016-01-27 DIAGNOSIS — J069 Acute upper respiratory infection, unspecified: Secondary | ICD-10-CM

## 2016-01-27 DIAGNOSIS — I10 Essential (primary) hypertension: Secondary | ICD-10-CM | POA: Diagnosis not present

## 2016-01-27 DIAGNOSIS — M5412 Radiculopathy, cervical region: Secondary | ICD-10-CM | POA: Diagnosis not present

## 2016-01-27 MED ORDER — LEVOFLOXACIN 250 MG PO TABS
250.0000 mg | ORAL_TABLET | Freq: Every day | ORAL | 0 refills | Status: AC
Start: 2016-01-27 — End: 2016-02-06

## 2016-01-27 NOTE — Patient Instructions (Signed)
.  Please take all new medication as prescribed - the antibiotic  Please continue all other medications as before, and refills have been done if requested.  Please have the pharmacy call with any other refills you may need.  Please keep your appointments with your specialists as you may have planned  You will be contacted regarding the referral for: MRI for the neck  You are given the work note as well  We may need to consider a Neurourgury referral, depending on the MRI results

## 2016-01-27 NOTE — Progress Notes (Signed)
Subjective:    Patient ID: [name] Walker, female    DOB: Jan 18, 1961, 55 y.o.   MRN: [id number]  HPI    Here with 2-3 days acute onset fever, facial pain, pressure, headache, general weakness and malaise, and greenish d/c, with mild ST and cough, but pt denies chest pain, wheezing, increased sob or doe, orthopnea, PND, increased LE swelling, palpitations, dizziness or syncope. Also with 1 mo severe Pain left neck from shoulder up to back of head, shooting type pain,   Worse to lie down, better to sit up, assoc with LUE pain and mild weakness and tingling. Pt denies bowel or bladder change, fever, wt loss,  worsening or LE pain/numbness/weakness, gait change or falls.  No other new hisotry changes.   Pt denies polydipsia, polyuria, Past Medical History:  Diagnosis Date  . ACHILLES TENDINITIS   . ALLERGIC RHINITIS   . GERD   . HYPERTENSION   . LATERAL EPICONDYLITIS, RIGHT   . LOW BACK PAIN   . Plantar fascial fibromatosis   . SUBACROMIAL BURSITIS, RIGHT   . Thoracic scoliosis 10/01/2015   Past Surgical History:  Procedure Laterality Date  . KIDNEY SURGERY    . TUBAL LIGATION      reports that she has been smoking Cigarettes.  She has been smoking about 1.00 pack per day. She has never used smokeless tobacco. She reports that she does not drink alcohol. Her drug history is not on file. family history includes Arthritis in her other; Cancer in her other and sister; Diabetes in her other; Hypertension in her other; Stroke in her other. Allergies  Allergen Reactions  . Other Palpitations    ALL MUSCLE RELAXERS-PER PATIENT  . Lisinopril Other (See Comments)    ? Possible tongue swelling    Current Outpatient Prescriptions on File Prior to Visit  Medication Sig Dispense Refill  . acetaminophen-codeine (TYLENOL #3) 300-30 MG tablet TAKE ONE TABLET BY MOUTH EVERY 8 HOURS AS NEEDED FOR  MODERATE  PAIN 60 tablet 0  . albuterol (PROVENTIL HFA;VENTOLIN HFA) 108 (90 Base) MCG/ACT inhaler Inhale  2 puffs into the lungs every 6 (six) hours as needed for wheezing or shortness of breath. 1 Inhaler 11  . ALPRAZolam (XANAX) 0.25 MG tablet Take 1 tablet (0.25 mg total) by mouth 2 (two) times daily as needed for anxiety. 40 tablet 1  . amLODipine (NORVASC) 5 MG tablet Take 1 tablet (5 mg total) by mouth daily. 90 tablet 3  . desonide (DESOWEN) 0.05 % cream Apply topically 2 (two) times daily. 30 g 2  . fesoterodine (TOVIAZ) 4 MG TB24 tablet Take 1 tablet (4 mg total) by mouth daily. 90 tablet 3  . hydrochlorothiazide (HYDRODIURIL) 25 MG tablet Take 1 tablet (25 mg total) by mouth daily. 90 tablet 3  . ibuprofen (ADVIL,MOTRIN) 800 MG tablet TAKE ONE TABLET BY MOUTH EVERY 8 HOURS AS NEEDED FOR MODERATE PAIN 30 tablet 0  . methocarbamol (ROBAXIN) 750 MG tablet TAKE ONE TABLET BY MOUTH THREE TIMES DAILY 30 tablet 0  . pantoprazole (PROTONIX) 40 MG tablet Take 1 tablet (40 mg total) by mouth daily. 90 tablet 3  . potassium chloride (KLOR-CON 10) 10 MEQ tablet Take 1 tablet (10 mEq total) by mouth daily. 90 tablet 3  . sodium chloride (OCEAN) 0.65 % SOLN nasal spray Place 1-2 sprays into both nostrils daily as needed for congestion.    Marland Kitchen tolterodine (DETROL LA) 4 MG 24 hr capsule Take 1 capsule (4 mg total) by  mouth daily. 90 capsule 3   No current facility-administered medications on file prior to visit.    Review of Systems  Constitutional: Negative for unusual diaphoresis or night sweats HENT: Negative for ear swelling or discharge Eyes: Negative for worsening visual haziness  Respiratory: Negative for choking and stridor.   Gastrointestinal: Negative for distension or worsening eructation Genitourinary: Negative for retention or change in urine volume.  Musculoskeletal: Negative for other MSK pain or swelling Skin: Negative for color change and worsening wound Neurological: Negative for tremors and numbness other than noted  Psychiatric/Behavioral: Negative for decreased concentration or  agitation other than above   All other system neg per pt    Objective:   Physical Exam BP 140/80   Pulse 91   Resp 20   Wt 152 lb (68.9 kg)   LMP 08/22/2010   SpO2 97%   BMI 27.80 kg/m  VS noted, mild ill Constitutional: Pt appears in no apparent distress HENT: Head: NCAT.  Right Ear: External ear normal.  Left Ear: External ear normal.  Eyes: . Pupils are equal, round, and reactive to light. Conjunctivae and EOM are normal Bilat tm's with mild erythema.  Max sinus areas mild tender.  Pharynx with mild erythema, no exudate Neck: Normal range of motion. Neck supple.  Cardiovascular: Normal rate and regular rhythm.   Pulmonary/Chest: Effort normal and breath sounds without rales or wheezing.  Neurological: Pt is alert. Not confused , motor with 4+/5 LUE weakness o/w motor intact, dtr symettric, gait not tested Skin: Skin is warm. No rash, no LE edema Psychiatric: Pt behavior is normal. No agitation.  No other new exam findings    Assessment & Plan:

## 2016-01-27 NOTE — Progress Notes (Signed)
Pre visit review using our clinic review tool, if applicable. No additional management support is needed unless otherwise documented below in the visit note. 

## 2016-01-28 ENCOUNTER — Telehealth: Payer: Self-pay | Admitting: Internal Medicine

## 2016-01-28 NOTE — Telephone Encounter (Signed)
Never mind. Pt seen Dr. Jenny Reichmann yesterday and he put a different order in and it was approved

## 2016-01-28 NOTE — Telephone Encounter (Signed)
This has been done.

## 2016-01-28 NOTE — Telephone Encounter (Signed)
Patient states that Dr. Jenny Reichmann wrote her a letter to be out of work through the 7th.  Patient states her employer is asking for him to add to that letter that she will be able to return to work with no restrictions.  Please call when ready.  Patient will pick up.

## 2016-01-28 NOTE — Progress Notes (Signed)
Letter done

## 2016-01-29 DIAGNOSIS — M5412 Radiculopathy, cervical region: Secondary | ICD-10-CM | POA: Insufficient documentation

## 2016-01-29 DIAGNOSIS — U071 COVID-19: Secondary | ICD-10-CM | POA: Insufficient documentation

## 2016-01-29 DIAGNOSIS — J069 Acute upper respiratory infection, unspecified: Secondary | ICD-10-CM | POA: Insufficient documentation

## 2016-01-29 NOTE — Assessment & Plan Note (Signed)
New onset, with neuro change, for MRI c spine, off work at least one wk fo now, consider NS pending results, declines referral until results

## 2016-01-29 NOTE — Assessment & Plan Note (Signed)
stable overall by history and exam, recent data reviewed with pt, and pt to continue medical treatment as before,  to f/u any worsening symptoms or concerns BP Readings from Last 3 Encounters:  01/27/16 140/80  01/11/16 (!) 142/94  01/09/16 (!) 170/106

## 2016-02-01 ENCOUNTER — Telehealth: Payer: Self-pay | Admitting: Internal Medicine

## 2016-02-01 NOTE — Telephone Encounter (Signed)
Patient states that she can not get scheduled for an MRI until next week.  Patient is requesting Dr. Jenny Reichmann to write her out of work through this time being able to return on the 18th with no restrictions.  Patient states she is taking her last antibiotic today.  States neck is still in pain.  Please follow up in regard.

## 2016-02-01 NOTE — Telephone Encounter (Signed)
Ok for note  Unfortunately, further antibx are not likely to help neck if the first did not help.  If no fever, ok to use tylenol prn, but if fever, I would consider an OV to re-assess

## 2016-02-02 NOTE — Progress Notes (Signed)
Letter done

## 2016-02-04 ENCOUNTER — Telehealth: Payer: Self-pay

## 2016-02-04 NOTE — Telephone Encounter (Signed)
Pt states that Muscle relaxer is causing her tongue to swell "again". She says she took 25 mg Benadryl and swelling improved. No SOB, wheezing or other swelling.  Pt advised to D/C medication, please advise on alternative. Thanks!  Paperwork signed, faxed, original placed in cabinet for pt pick up. Pt advised of same

## 2016-02-04 NOTE — Telephone Encounter (Signed)
FMLA paperwork received and complete for 01/28/2016 - 02/14/2016. Placed on MD's desk for signature

## 2016-02-04 NOTE — Telephone Encounter (Signed)
I placed robaxin on the allergy list  OK to cont benadryl as above, follow up any worsening s/s

## 2016-02-04 NOTE — Telephone Encounter (Signed)
Pt advised in detail and will follow up if sxs worsen or do not improve

## 2016-02-04 NOTE — Addendum Note (Signed)
Addended by: Biagio Borg on: 02/04/2016 01:01 PM   Modules accepted: Orders

## 2016-02-07 ENCOUNTER — Emergency Department (HOSPITAL_COMMUNITY): Payer: BC Managed Care – PPO

## 2016-02-07 ENCOUNTER — Telehealth: Payer: Self-pay | Admitting: Internal Medicine

## 2016-02-07 ENCOUNTER — Emergency Department (HOSPITAL_COMMUNITY)
Admission: EM | Admit: 2016-02-07 | Discharge: 2016-02-07 | Disposition: A | Discharge status: Home / Self Care | Payer: BC Managed Care – PPO | Attending: Emergency Medicine | Admitting: Emergency Medicine

## 2016-02-07 ENCOUNTER — Encounter (HOSPITAL_COMMUNITY): Payer: Self-pay | Admitting: Emergency Medicine

## 2016-02-07 DIAGNOSIS — R0789 Other chest pain: Secondary | ICD-10-CM | POA: Diagnosis not present

## 2016-02-07 DIAGNOSIS — F17210 Nicotine dependence, cigarettes, uncomplicated: Secondary | ICD-10-CM | POA: Insufficient documentation

## 2016-02-07 DIAGNOSIS — R079 Chest pain, unspecified: Secondary | ICD-10-CM | POA: Diagnosis present

## 2016-02-07 DIAGNOSIS — I10 Essential (primary) hypertension: Secondary | ICD-10-CM | POA: Diagnosis not present

## 2016-02-07 DIAGNOSIS — J45909 Unspecified asthma, uncomplicated: Secondary | ICD-10-CM | POA: Insufficient documentation

## 2016-02-07 DIAGNOSIS — M62838 Other muscle spasm: Secondary | ICD-10-CM | POA: Diagnosis not present

## 2016-02-07 LAB — BASIC METABOLIC PANEL
Anion gap: 8 (ref 5–15)
BUN: 14 mg/dL (ref 6–20)
CO2: 24 mmol/L (ref 22–32)
Calcium: 9.5 mg/dL (ref 8.9–10.3)
Chloride: 110 mmol/L (ref 101–111)
Creatinine, Ser: 0.74 mg/dL (ref 0.44–1.00)
GFR calc Af Amer: >60 mL/min (ref >60)
GFR calc non Af Amer: >60 mL/min (ref >60)
Glucose, Bld: 128 mg/dL — ABNORMAL HIGH (ref 65–99)
Potassium: 3.8 mmol/L (ref 3.5–5.1)
Sodium: 142 mmol/L (ref 135–145)

## 2016-02-07 LAB — I-STAT TROPONIN, ED
Troponin i, poc: 0 ng/mL (ref 0.00–0.08)
Troponin i, poc: 0 ng/mL (ref 0.00–0.08)

## 2016-02-07 LAB — D-DIMER, QUANTITATIVE (NOT AT ARMC): D-Dimer, Quant: 0.32 ug/mL-FEU (ref 0.00–0.50)

## 2016-02-07 LAB — CBC
HCT: 43.4 % (ref 36.0–46.0)
Hemoglobin: 14.2 g/dL (ref 12.0–15.0)
MCH: 28.3 pg (ref 26.0–34.0)
MCHC: 32.7 g/dL (ref 30.0–36.0)
MCV: 86.5 fL (ref 78.0–100.0)
Platelets: 145 10*3/uL — ABNORMAL LOW (ref 150–400)
RBC: 5.02 MIL/uL (ref 3.87–5.11)
RDW: 14 % (ref 11.5–15.5)
WBC: 6.1 10*3/uL (ref 4.0–10.5)

## 2016-02-07 MED ORDER — DIAZEPAM 5 MG PO TABS: 5.0000 mg | ORAL_TABLET | Freq: Three times a day (TID) | ORAL | 0 refills | Status: DC | PRN

## 2016-02-07 MED ORDER — IBUPROFEN 600 MG PO TABS: 600.0000 mg | ORAL_TABLET | Freq: Four times a day (QID) | ORAL | 0 refills | Status: DC | PRN

## 2016-02-07 MED ORDER — HYDROMORPHONE HCL 1 MG/ML IJ SOLN
0.5000 mg | Freq: Once | INTRAMUSCULAR | Status: AC
  Administered 2016-02-07: 0.5 mg via INTRAVENOUS
  Filled 2016-02-07: qty 1

## 2016-02-07 MED ORDER — HYDROCODONE-ACETAMINOPHEN 5-325 MG PO TABS: 1.0000 | ORAL_TABLET | Freq: Four times a day (QID) | ORAL | 0 refills | Status: DC | PRN

## 2016-02-07 MED ORDER — KETOROLAC TROMETHAMINE 30 MG/ML IJ SOLN
30.0000 mg | Freq: Once | INTRAMUSCULAR | Status: AC
  Administered 2016-02-07: 30 mg via INTRAVENOUS
  Filled 2016-02-07: qty 1

## 2016-02-07 MED ORDER — DIAZEPAM 5 MG PO TABS
5.0000 mg | ORAL_TABLET | Freq: Once | ORAL | Status: AC
  Administered 2016-02-07: 5 mg via ORAL
  Filled 2016-02-07: qty 1

## 2016-02-07 MED ORDER — IPRATROPIUM-ALBUTEROL 0.5-2.5 (3) MG/3ML IN SOLN
3.0000 mL | Freq: Once | RESPIRATORY_TRACT | Status: AC
  Administered 2016-02-07: 3 mL via RESPIRATORY_TRACT
  Filled 2016-02-07: qty 3

## 2016-02-07 NOTE — ED Triage Notes (Signed)
Pt reports right chest "heaviness" no radiation, also reports shortness of breath , sts took breathing treatnent at home with some relief. Hx acid reflux. Pt sts feels something stuck in throat. Hx HTN. Alert and oriented x4.

## 2016-02-07 NOTE — ED Notes (Signed)
PT GOING TO XR. DELAY IN BLOOD DRAW.

## 2016-02-07 NOTE — Discharge Instructions (Signed)
Take norco for severe pain. Valium for spasms. Follow up with a family doctor as needed. Proceed with MRI as scheduled.

## 2016-02-07 NOTE — Telephone Encounter (Signed)
[place] [facility] - Client [place] [facility]  Patient Name: [name] Walker  DOB: [birth year]    Initial Comment Caller has been having a little bit of chest pain.    Nurse Assessment  Nurse: Wayne Sever, RN, Tillie Rung Date/Time Eilene Ghazi Time): 02/07/2016 12:23:28 PM  Confirm and document reason for call. If symptomatic, describe symptoms. ---Caller states she had pain in her left arm last week. She states her chest feels slightly heavy this morning. She states she used her inhaler and then she felt better.  Does the patient have any new or worsening symptoms? ---Yes  Will a triage be completed? ---Yes  Related visit to physician within the last 2 weeks? ---No  Does the PT have any chronic conditions? (i.e. diabetes, asthma, etc.) ---Yes  List chronic conditions. ---Allergies, Neck Issues  Is this a behavioral health or substance abuse call? ---No     Guidelines    Guideline Title Affirmed Question Affirmed Notes  Chest Pain [1] Chest pain lasts > 5 minutes AND [2] age > 37    Final Disposition User   Call EMS 911 Now Wayne Sever, RN, Tillie Rung    Referrals  [facility] - ED   Disagree/Comply: Comply

## 2016-02-07 NOTE — ED Provider Notes (Signed)
[facility] DEPT Provider Note   CSN: [id number] Arrival date & time: 02/07/16  1257     History   Chief Complaint Chief Complaint  Patient presents with  . Chest Pain  . Shortness of Breath    HPI SIBLE GATZ is a 55 y.o. female.  HPI ELODIE MCWILLIAM is a 55 y.o. female with hx of HTN, neck pain, presents to ED complaining of right sided ear pain, neck pain, chest pain. Patient states she had left-sided symptoms for several months, has seen her doctor, currently being treated with Tylenol No. 3, and has MRI of her neck scheduled in 2 days. Patient states starting today she started having severe pain on the right side. States pain radiates all the way from the right side anterior chest into the right neck and right ear. She reports some throat, denies any nasal congestion. States 2 weeks ago was treated for throat and ear infection with antibiotics which she finished with no improvement of her symptoms are that time. She is having no relief of pain with Tylenol No. 3 at home. Her husband states that he is tired of seeing his wife and so much pain and wants her admitted for pain management and further workup.  Past Medical History:  Diagnosis Date  . ACHILLES TENDINITIS   . ALLERGIC RHINITIS   . GERD   . HYPERTENSION   . LATERAL EPICONDYLITIS, RIGHT   . LOW BACK PAIN   . Plantar fascial fibromatosis   . SUBACROMIAL BURSITIS, RIGHT   . Thoracic scoliosis 10/01/2015    Patient Active Problem List   Diagnosis Date Noted  . Cervical radiculopathy, acute 01/29/2016  . Acute upper respiratory infection 01/29/2016  . Degenerative disc disease, cervical 01/11/2016  . Hyperglycemia 11/17/2015  . Angioedema 10/12/2015  . Bilateral lower extremity edema 10/12/2015  . Mouth sores 10/12/2015  . Hypokalemia 10/12/2015  . Thoracic scoliosis 10/01/2015  . Peripheral edema 10/01/2015  . Eczema 10/01/2015  . Asthma 10/01/2015  . Breast lump on right side at 4 o'clock position  01/21/2014  . Hot flashes 10/01/2013  . Grief reaction 07/17/2012  . Family history of colon cancer 07/02/2012  . OAB (overactive bladder) 03/01/2012  . Encounter for well adult exam with abnormal findings 08/23/2010  . Plantar fasciitis 08/23/2010  . UTI (urinary tract infection) 10/19/2008  . ANKLE PAIN, BILATERAL 10/19/2008  . VAGINITIS 09/10/2008  . JOINT EFFUSION, ANKLE 09/10/2008  . SUBACROMIAL BURSITIS, RIGHT 09/01/2008  . ACHILLES TENDINITIS 09/01/2008  . LATERAL EPICONDYLITIS, RIGHT 08/18/2008  . Essential hypertension 07/12/2007  . ALLERGIC RHINITIS 07/12/2007  . GERD 07/12/2007  . Chronic low back pain 07/12/2007  . NEPHROLITHIASIS, HX OF 07/12/2007    Past Surgical History:  Procedure Laterality Date  . KIDNEY SURGERY    . TUBAL LIGATION      OB History    No data available       Home Medications    Prior to Admission medications   Medication Sig Start Date End Date Taking? Authorizing Provider  acetaminophen-codeine (TYLENOL #3) 300-30 MG tablet TAKE ONE TABLET BY MOUTH EVERY 8 HOURS AS NEEDED FOR  MODERATE  PAIN 01/25/16  Yes Golden Circle, FNP  albuterol (PROVENTIL HFA;VENTOLIN HFA) 108 (90 Base) MCG/ACT inhaler Inhale 2 puffs into the lungs every 6 (six) hours as needed for wheezing or shortness of breath. 10/01/15  Yes Biagio Borg, MD  ALPRAZolam Duanne Moron) 0.25 MG tablet Take 1 tablet (0.25 mg total) by mouth 2 (two)  times daily as needed for anxiety. 10/01/15  Yes Biagio Borg, MD  amLODipine (NORVASC) 5 MG tablet Take 1 tablet (5 mg total) by mouth daily. 10/01/15  Yes Biagio Borg, MD  pantoprazole (PROTONIX) 40 MG tablet Take 1 tablet (40 mg total) by mouth daily. 10/01/15  Yes Biagio Borg, MD  desonide (DESOWEN) 0.05 % cream Apply topically 2 (two) times daily. Patient not taking: Reported on 02/07/2016 10/01/15   Biagio Borg, MD  fesoterodine (TOVIAZ) 4 MG TB24 tablet Take 1 tablet (4 mg total) by mouth daily. Patient not taking: Reported on 02/07/2016  11/11/15   Biagio Borg, MD  hydrochlorothiazide (HYDRODIURIL) 25 MG tablet Take 1 tablet (25 mg total) by mouth daily. Patient not taking: Reported on 02/07/2016 10/01/15   Biagio Borg, MD  ibuprofen (ADVIL,MOTRIN) 800 MG tablet TAKE ONE TABLET BY MOUTH EVERY 8 HOURS AS NEEDED FOR MODERATE PAIN Patient not taking: Reported on 02/07/2016 01/27/16   Biagio Borg, MD  potassium chloride (KLOR-CON 10) 10 MEQ tablet Take 1 tablet (10 mEq total) by mouth daily. Patient not taking: Reported on 02/07/2016 10/01/15   Biagio Borg, MD  sodium chloride (OCEAN) 0.65 % SOLN nasal spray Place 1-2 sprays into both nostrils daily as needed for congestion.    Historical Provider, MD  tolterodine (DETROL LA) 4 MG 24 hr capsule Take 1 capsule (4 mg total) by mouth daily. Patient not taking: Reported on 02/07/2016 11/17/15   Biagio Borg, MD    Family History Family History  Problem Relation Age of Onset  . Cancer Sister     colon cancer  . Arthritis Other   . Diabetes Other   . Hypertension Other   . Cancer Other     breast cancer and prostate cancer  . Stroke Other     Social History Social History  Substance Use Topics  . Smoking status: Current Every Day Smoker    Packs/day: 1.00    Types: Cigarettes  . Smokeless tobacco: Never Used  . Alcohol use No     Allergies   Other; Lisinopril; and Robaxin [methocarbamol]   Review of Systems Review of Systems  Constitutional: Negative for chills and fever.  HENT: Positive for ear pain and sore throat. Negative for congestion, facial swelling and sinus pain.   Respiratory: Negative for cough, chest tightness and shortness of breath.   Cardiovascular: Positive for chest pain. Negative for palpitations and leg swelling.  Gastrointestinal: Negative for abdominal pain, diarrhea, nausea and vomiting.  Genitourinary: Negative for dysuria, flank pain, pelvic pain, vaginal bleeding, vaginal discharge and vaginal pain.  Musculoskeletal: Positive for  arthralgias, back pain, myalgias and neck pain. Negative for neck stiffness.  Skin: Negative for rash.  Neurological: Positive for headaches. Negative for dizziness and weakness.  All other systems reviewed and are negative.    Physical Exam Updated Vital Signs BP (!) 155/118   Pulse 89   Temp 98.1 F (36.7 C) (Oral)   Resp 12   LMP 08/22/2010   SpO2 96%   Physical Exam  Constitutional: She is oriented to person, place, and time. She appears well-developed and well-nourished. No distress.  HENT:  Head: Normocephalic.  Right Ear: External ear normal.  Left Ear: External ear normal.  Nose: Nose normal.  Mouth/Throat: Oropharynx is clear and moist.  Ear canals and TMs are normal bilaterally  Eyes: Conjunctivae are normal.  Neck: Neck supple.  Bilaterally tender trapezius from the base of the neck  to the shoulders, tenderness over bilateral sternocleidomastoid muscle. Bilateral tenderness over parascapular muscles. No bruits or pulsatile masses.  Cardiovascular: Normal rate, regular rhythm and normal heart sounds.   Pulmonary/Chest: Effort normal and breath sounds normal. No respiratory distress. She has no wheezes. She has no rales. She exhibits tenderness.  Right upper chest wall tenderness  Abdominal: Soft. Bowel sounds are normal. She exhibits no distension. There is no tenderness. There is no rebound.  Musculoskeletal: She exhibits no edema.  Neurological: She is alert and oriented to person, place, and time. No cranial nerve deficit. Coordination normal.  Distal radial and dorsal pedal pulses are intact and equal bilaterally  Skin: Skin is warm and dry.  Psychiatric: She has a normal mood and affect. Her behavior is normal.  Nursing note and vitals reviewed.    ED Treatments / Results  Labs (all labs ordered are listed, but only abnormal results are displayed) Labs Reviewed  BASIC METABOLIC PANEL - Abnormal; Notable for the following:       Result Value   Glucose,  Bld 128 (*)    All other components within normal limits  CBC - Abnormal; Notable for the following:    Platelets 145 (*)    All other components within normal limits  D-DIMER, QUANTITATIVE (NOT AT [facility])  Randolm Idol, ED    EKG  EKG Interpretation  Date/Time:  Monday February 07 2016 13:08:27 EST Ventricular Rate:  86 PR Interval:    QRS Duration: 68 QT Interval:  331 QTC Calculation: 396 R Axis:   53 Text Interpretation:  Sinus rhythm RSR' in V1 or V2, probably normal variant Nonspecific repol abnormality, diffuse leads nonspecific ST/T abnormalitiessimilar to Aug 2017 Confirmed by Regenia Skeeter MD, [place] [phone]) on 02/07/2016 4:04:41 PM       Radiology Dg Chest 2 View  Result Date: 02/07/2016 CLINICAL DATA:  Left-sided chest heaviness and shortness of breath began today. Patient has history of hypertension, current smoker. EXAM: CHEST  2 VIEW COMPARISON:  Chest x-ray of March 01, 2014 FINDINGS: The lungs are adequately inflated and clear. The heart and pulmonary vascularity are normal. There is marked reverse S-shaped thoracic scoliosis which is stable. There is no pleural effusion or pneumothorax. The gas pattern in the upper abdomen is normal. IMPRESSION: No acute cardiopulmonary disease. Significant reverse S-shaped thoracic scoliosis. Electronically Signed   By: David  Martinique M.D.   On: 02/07/2016 13:21    Procedures Procedures (including critical care time)  Medications Ordered in ED Medications  diazepam (VALIUM) tablet 5 mg (5 mg Oral Given 02/07/16 1808)  HYDROmorphone (DILAUDID) injection 0.5 mg (0.5 mg Intravenous Given 02/07/16 1809)  ketorolac (TORADOL) 30 MG/ML injection 30 mg (30 mg Intravenous Given 02/07/16 1809)  ipratropium-albuterol (DUONEB) 0.5-2.5 (3) MG/3ML nebulizer solution 3 mL (3 mLs Nebulization Given 02/07/16 1746)     Initial Impression / Assessment and Plan / ED Course  I have reviewed the triage vital signs and the nursing  notes.  Pertinent labs & imaging results that were available during my care of the patient were reviewed by me and considered in my medical decision making (see chart for details).  Clinical Course    Patient in emergency department complaining of pain to the right chest radiating to the right neck and right ear. Ear exam, throat unremarkable. She is afebrile. Normal vital signs. Chest x-ray negative other than scoliosis. Question muscular spasms versus radicular pain. Will treat pain in the ED and get a d-dimer given pleuritic symptoms.  Patient  feels better after Valium, Dilaudid, and appears to be more comfortable. Her labs including d-dimer negative. Discussed with Dr. Karsten Ro who has seen patient as well, will get a delta troponin.  Delta troponin is negative, patient continues to feel much better. She is comfortable going home. We'll give her stronger pain medication and muscle relaxant until she is able to follow-up. She has MRI scheduled in 2 days. Stable for discharge home with return precautions.  Vitals:   02/07/16 1747 02/07/16 1755 02/07/16 1950 02/07/16 2044  BP:  (!) 155/118 135/93 (!) 138/103  Pulse:  89 91 96  Resp:  12 18 23   Temp:      TempSrc:      SpO2: 93% 96% 95% 97%     Final Clinical Impressions(s) / ED Diagnoses   Final diagnoses:  Muscle spasms of neck  Chest wall pain    New Prescriptions Discharge Medication List as of 02/07/2016  8:31 PM    START taking these medications   Details  diazepam (VALIUM) 5 MG tablet Take 1 tablet (5 mg total) by mouth every 8 (eight) hours as needed for muscle spasms., Starting Mon 02/07/2016, Print    HYDROcodone-acetaminophen (NORCO) 5-325 MG tablet Take 1 tablet by mouth every 6 (six) hours as needed for moderate pain., Starting Mon 02/07/2016, Print         [employer], PA-C 02/08/16 [id number]    Sherwood Gambler, MD 02/08/16 [phone]

## 2016-02-09 ENCOUNTER — Ambulatory Visit
Admission: RE | Admit: 2016-02-09 | Discharge: 2016-02-09 | Disposition: A | Discharge status: Home / Self Care | Payer: BC Managed Care – PPO | Source: Ambulatory Visit | Attending: Internal Medicine | Admitting: Internal Medicine

## 2016-02-09 DIAGNOSIS — M5412 Radiculopathy, cervical region: Secondary | ICD-10-CM

## 2016-02-10 ENCOUNTER — Other Ambulatory Visit: Payer: Self-pay | Admitting: Internal Medicine

## 2016-02-10 DIAGNOSIS — M5412 Radiculopathy, cervical region: Secondary | ICD-10-CM

## 2016-02-11 ENCOUNTER — Telehealth: Payer: Self-pay

## 2016-02-11 NOTE — Telephone Encounter (Signed)
Please advise patient is requesting information on what she needs to do about work

## 2016-02-11 NOTE — Telephone Encounter (Signed)
When is neurosurgury appt?  Does she have one yet?  Otherwise the answer is no different from the first 2 answers

## 2016-02-11 NOTE — Telephone Encounter (Signed)
Same answer , ok for note as she requested below

## 2016-02-11 NOTE — Telephone Encounter (Signed)
Patient brought in piece of FMLA that was not filled out. I will fill out paperwork and call patient once it is ready to be picked up. Patient stated that she wanted another note to be out of work longer. Spoke to Dr. Jenny Reichmann he advised that the note that was written for patient to be out until the 18th was sufficient at this time and if neurosurgery felt that she needed to be out longer they could write her a note. Advised patient of Dr. Judi Cong answer and she stated that she would make a return OV to discuss work note.

## 2016-02-15 ENCOUNTER — Encounter: Payer: Self-pay | Admitting: Internal Medicine

## 2016-02-15 ENCOUNTER — Ambulatory Visit (INDEPENDENT_AMBULATORY_CARE_PROVIDER_SITE_OTHER): Payer: BC Managed Care – PPO | Admitting: Internal Medicine

## 2016-02-15 VITALS — BP 138/80 | HR 88 | Resp 20 | Wt 148.0 lb

## 2016-02-15 DIAGNOSIS — M5412 Radiculopathy, cervical region: Secondary | ICD-10-CM

## 2016-02-15 DIAGNOSIS — R739 Hyperglycemia, unspecified: Secondary | ICD-10-CM | POA: Diagnosis not present

## 2016-02-15 DIAGNOSIS — I10 Essential (primary) hypertension: Secondary | ICD-10-CM

## 2016-02-15 MED ORDER — DIAZEPAM 5 MG PO TABS: ORAL_TABLET | ORAL | 1 refills | Status: DC

## 2016-02-15 MED ORDER — HYDROCODONE-ACETAMINOPHEN 5-325 MG PO TABS: 1.0000 | ORAL_TABLET | Freq: Four times a day (QID) | ORAL | 0 refills | Status: DC | PRN

## 2016-02-15 NOTE — Progress Notes (Signed)
Subjective:    Patient ID: [name] Walker, female    DOB: 1960/11/02, 55 y.o.   MRN: [id number]  HPI  Here to f/u with persistent neck pain without significant improvement, No fever, ST, cough, HA but with post neck pain right > left with some radiation towards the right shoulder and right upper chest, but Pt continues to have recurring pain without bowel or bladder change, fever, wt loss,  worsening extremity pain/numbness/weakness, gait change or falls.Hydrocodone works well, but asks for muscle relaxer as well  Was  seen at ED Feb 08, 2016: "Patient with right sided neck/chest, ear pain. No obvious cause. Agree with keeping MRI as already scheduled as outpatient but no acute neuro symptoms."  Subsequently: MRI CERVICAL SPINE WITHOUT CONTRAST 02/09/2016 IMPRESSION: 1. At C2-3 there is a broad left paracentral disc protrusion abutting the left ventral paracentral cervical spinal cord. 2. At C4-5 there is a broad based disc bulge. Bilateral uncovertebral degenerative changes. Moderate bilateral foraminal stenosis, right worse than left. 3. At C5-6 there is a broad-based disc bulge. Bilateral uncovertebral degenerative changes. Mild left foraminal narrowing. Moderate right foraminal narrowing.  Last worked Nov 18.   Pt denies polydipsia, polyuria    Pt trying to quit smoking with accessing the help quit line as well Past Medical History:  Diagnosis Date  . ACHILLES TENDINITIS   . ALLERGIC RHINITIS   . GERD   . HYPERTENSION   . LATERAL EPICONDYLITIS, RIGHT   . LOW BACK PAIN   . Plantar fascial fibromatosis   . SUBACROMIAL BURSITIS, RIGHT   . Thoracic scoliosis 10/01/2015   Past Surgical History:  Procedure Laterality Date  . KIDNEY SURGERY    . TUBAL LIGATION      reports that she has been smoking Cigarettes.  She has been smoking about 1.00 pack per day. She has never used smokeless tobacco. She reports that she does not drink alcohol. Her drug history is not on file. family  history includes Arthritis in her other; Cancer in her other and sister; Diabetes in her other; Hypertension in her other; Stroke in her other. Allergies  Allergen Reactions  . Other Palpitations    ALL MUSCLE RELAXERS-PER PATIENT  . Lisinopril Other (See Comments)    ? Possible tongue swelling   . Robaxin [Methocarbamol] Other (See Comments)    Tongue swelling   Current Outpatient Prescriptions on File Prior to Visit  Medication Sig Dispense Refill  . acetaminophen-codeine (TYLENOL #3) 300-30 MG tablet TAKE ONE TABLET BY MOUTH EVERY 8 HOURS AS NEEDED FOR  MODERATE  PAIN 60 tablet 0  . albuterol (PROVENTIL HFA;VENTOLIN HFA) 108 (90 Base) MCG/ACT inhaler Inhale 2 puffs into the lungs every 6 (six) hours as needed for wheezing or shortness of breath. 1 Inhaler 11  . amLODipine (NORVASC) 5 MG tablet Take 1 tablet (5 mg total) by mouth daily. 90 tablet 3  . desonide (DESOWEN) 0.05 % cream Apply topically 2 (two) times daily. 30 g 2  . fesoterodine (TOVIAZ) 4 MG TB24 tablet Take 1 tablet (4 mg total) by mouth daily. 90 tablet 3  . hydrochlorothiazide (HYDRODIURIL) 25 MG tablet Take 1 tablet (25 mg total) by mouth daily. 90 tablet 3  . ibuprofen (ADVIL,MOTRIN) 600 MG tablet Take 1 tablet (600 mg total) by mouth every 6 (six) hours as needed. 30 tablet 0  . pantoprazole (PROTONIX) 40 MG tablet Take 1 tablet (40 mg total) by mouth daily. 90 tablet 3  . potassium chloride (KLOR-CON 10)  10 MEQ tablet Take 1 tablet (10 mEq total) by mouth daily. 90 tablet 3  . sodium chloride (OCEAN) 0.65 % SOLN nasal spray Place 1-2 sprays into both nostrils daily as needed for congestion.    Marland Kitchen tolterodine (DETROL LA) 4 MG 24 hr capsule Take 1 capsule (4 mg total) by mouth daily. 90 capsule 3   No current facility-administered medications on file prior to visit.     Review of Systems  Constitutional: Negative for unusual diaphoresis or night sweats HENT: Negative for ear swelling or discharge Eyes: Negative for  worsening visual haziness  Respiratory: Negative for choking and stridor.   Gastrointestinal: Negative for distension or worsening eructation Genitourinary: Negative for retention or change in urine volume.  Musculoskeletal: Negative for other MSK pain or swelling Skin: Negative for color change and worsening wound Neurological: Negative for tremors and numbness other than noted  Psychiatric/Behavioral: Negative for decreased concentration or agitation other than above   All other system neg per pt    Objective:   Physical Exam BP 138/80   Pulse 88   Resp 20   Wt 148 lb (67.1 kg)   LMP 08/22/2010   SpO2 96%   BMI 27.07 kg/m  VS noted,  Constitutional: Pt appears in no apparent distress HENT: Head: NCAT.  Right Ear: External ear normal.  Left Ear: External ear normal.  Eyes: . Pupils are equal, round, and reactive to light. Conjunctivae and EOM are normal Neck: Normal range of motion. Neck supple. spine nontender Cardiovascular: Normal rate and regular rhythm.   Pulmonary/Chest: Effort normal and breath sounds without rales or wheezing.  Neurological: Pt is alert. Not confused , motor 5/5 intact, cn 2-12 intact Skin: Skin is warm. No rash, no LE edema Psychiatric: Pt behavior is normal. No agitation.  No other new exam findings    Assessment & Plan:

## 2016-02-15 NOTE — Progress Notes (Signed)
Letter done

## 2016-02-15 NOTE — Patient Instructions (Signed)
Please continue all other medications as before, and refills have been done if requested  - the hydrocodone and diazepam  Please have the pharmacy call with any other refills you may need.  Please continue your efforts at being more active, low cholesterol diet, and weight control..  Please keep your appointments with your specialists as you may have planned  You will be contacted regarding the referral for: Neurosurgury  You are given the work note today

## 2016-02-15 NOTE — Progress Notes (Signed)
Pre visit review using our clinic review tool, if applicable. No additional management support is needed unless otherwise documented below in the visit note. 

## 2016-02-20 NOTE — Assessment & Plan Note (Signed)
stable overall by history and exam, recent data reviewed with pt, and pt to continue medical treatment as before,  to f/u any worsening symptoms or concerns' Lab Results  Component Value Date   HGBA1C 5.5 11/17/2015

## 2016-02-20 NOTE — Assessment & Plan Note (Signed)
With abnormal MRI, cont pain control, f/u surgury as planned,  to f/u any worsening symptoms or concerns

## 2016-02-20 NOTE — Assessment & Plan Note (Signed)
stable overall by history and exam, recent data reviewed with pt, and pt to continue medical treatment as before,  to f/u any worsening symptoms or concerns BP Readings from Last 3 Encounters:  02/15/16 138/80  02/07/16 (!) 138/103  01/27/16 140/80

## 2016-02-24 ENCOUNTER — Telehealth: Payer: Self-pay | Admitting: Internal Medicine

## 2016-02-24 NOTE — Telephone Encounter (Signed)
Pt states she is having trouble with her bowel movements. She has been trying stool softeners but still hasn't gone in 3 days.  She states you gave her a prescription a couple years ago that was liquid and tasted like vanilla.   Maybe lactulose ([place]) 10 ???  She doesn't remember the name.  Her pharmacy is [facility] on [address].

## 2016-02-25 MED ORDER — LACTULOSE 10 GM/15ML PO SOLN: 30.0000 g | Freq: Two times a day (BID) | ORAL | 3 refills | Status: DC | PRN

## 2016-02-25 NOTE — Telephone Encounter (Signed)
[place] for lactulose - done erx

## 2016-03-01 ENCOUNTER — Telehealth: Payer: Self-pay | Admitting: Internal Medicine

## 2016-03-01 NOTE — Progress Notes (Signed)
Letter done

## 2016-03-01 NOTE — Telephone Encounter (Signed)
Yes, that would be ok  Ok for corinne for new note please

## 2016-03-01 NOTE — Telephone Encounter (Signed)
Letter was done. Faxed to patient work Librarian, academic.

## 2016-03-01 NOTE — Telephone Encounter (Signed)
Pt called in and wanted to know if Dr Jenny Reichmann could write a note for her to be out til after the 9th.  She does not see the surgeon til the 9th.  Work is concerned for her to come back until she see the Psychologist, sport and exercise

## 2016-03-06 ENCOUNTER — Other Ambulatory Visit: Payer: Self-pay | Admitting: Family

## 2016-03-06 DIAGNOSIS — M5030 Other cervical disc degeneration, unspecified cervical region: Secondary | ICD-10-CM

## 2016-03-06 NOTE — Telephone Encounter (Signed)
Routing to greg, please advise, thanks 

## 2016-03-06 NOTE — Telephone Encounter (Signed)
Faxed

## 2016-03-22 ENCOUNTER — Other Ambulatory Visit: Payer: Self-pay | Admitting: Family

## 2016-03-22 DIAGNOSIS — M5030 Other cervical disc degeneration, unspecified cervical region: Secondary | ICD-10-CM

## 2016-03-22 NOTE — Telephone Encounter (Signed)
faxed

## 2016-03-22 NOTE — Telephone Encounter (Signed)
Done hardcopy to Corinne  

## 2016-03-27 ENCOUNTER — Other Ambulatory Visit: Payer: Self-pay | Admitting: Neurosurgery

## 2016-04-04 ENCOUNTER — Encounter (HOSPITAL_COMMUNITY): Payer: Self-pay

## 2016-04-04 ENCOUNTER — Encounter (HOSPITAL_COMMUNITY)
Admission: RE | Admit: 2016-04-04 | Discharge: 2016-04-04 | Disposition: A | Discharge status: Home / Self Care | Payer: BC Managed Care – PPO | Source: Ambulatory Visit | Attending: Neurosurgery | Admitting: Neurosurgery

## 2016-04-04 DIAGNOSIS — M4184 Other forms of scoliosis, thoracic region: Secondary | ICD-10-CM | POA: Diagnosis not present

## 2016-04-04 DIAGNOSIS — I209 Angina pectoris, unspecified: Secondary | ICD-10-CM

## 2016-04-04 DIAGNOSIS — M2578 Osteophyte, vertebrae: Secondary | ICD-10-CM | POA: Diagnosis not present

## 2016-04-04 DIAGNOSIS — M5021 Other cervical disc displacement,  high cervical region: Secondary | ICD-10-CM | POA: Diagnosis not present

## 2016-04-04 DIAGNOSIS — Z888 Allergy status to other drugs, medicaments and biological substances status: Secondary | ICD-10-CM | POA: Diagnosis not present

## 2016-04-04 DIAGNOSIS — K219 Gastro-esophageal reflux disease without esophagitis: Secondary | ICD-10-CM | POA: Diagnosis not present

## 2016-04-04 DIAGNOSIS — I10 Essential (primary) hypertension: Secondary | ICD-10-CM | POA: Diagnosis not present

## 2016-04-04 DIAGNOSIS — F17210 Nicotine dependence, cigarettes, uncomplicated: Secondary | ICD-10-CM | POA: Diagnosis not present

## 2016-04-04 DIAGNOSIS — I739 Peripheral vascular disease, unspecified: Secondary | ICD-10-CM | POA: Diagnosis not present

## 2016-04-04 HISTORY — DX: Angina pectoris, unspecified: I20.9

## 2016-04-04 LAB — CBC
HCT: 42.2 % (ref 36.0–46.0)
Hemoglobin: 14.1 g/dL (ref 12.0–15.0)
MCH: 28.3 pg (ref 26.0–34.0)
MCHC: 33.4 g/dL (ref 30.0–36.0)
MCV: 84.6 fL (ref 78.0–100.0)
Platelets: 131 10*3/uL — ABNORMAL LOW (ref 150–400)
RBC: 4.99 MIL/uL (ref 3.87–5.11)
RDW: 14.6 % (ref 11.5–15.5)
WBC: 6 10*3/uL (ref 4.0–10.5)

## 2016-04-04 LAB — BASIC METABOLIC PANEL
Anion gap: 6 (ref 5–15)
BUN: 11 mg/dL (ref 6–20)
CO2: 24 mmol/L (ref 22–32)
Calcium: 9.6 mg/dL (ref 8.9–10.3)
Chloride: 112 mmol/L — ABNORMAL HIGH (ref 101–111)
Creatinine, Ser: 0.61 mg/dL (ref 0.44–1.00)
GFR calc Af Amer: >60 mL/min (ref >60)
GFR calc non Af Amer: >60 mL/min (ref >60)
Glucose, Bld: 94 mg/dL (ref 65–99)
Potassium: 3.8 mmol/L (ref 3.5–5.1)
Sodium: 142 mmol/L (ref 135–145)

## 2016-04-04 LAB — SURGICAL PCR SCREEN
MRSA, PCR: NEGATIVE
Staphylococcus aureus: NEGATIVE

## 2016-04-04 LAB — TYPE AND SCREEN
ABO/RH(D): O POS
Antibody Screen: NEGATIVE

## 2016-04-04 LAB — ABO/RH: ABO/RH(D): O POS

## 2016-04-04 MED ORDER — CHLORHEXIDINE GLUCONATE CLOTH 2 % EX PADS: 6.0000 | MEDICATED_PAD | Freq: Once | CUTANEOUS | Status: DC

## 2016-04-04 NOTE — Progress Notes (Signed)
Anesthesia PAT Evaluation: Patient is a 56 year old female scheduled for C2-3 ACDF on 04/07/2016 by Dr. Christella Noa.  History includes smoking (4 cig/day), hypertension, GERD, thoracic scoliosis, back surgery, nephrolithiasis s/p surgery 11/19/09. She denied known history of CAD/MI/CHF/arrhythmia, or DM. She has a family history of "irregular heart beat" in her mother, but denied known CAD/MI/PCI/CABG history.   PCP is Dr. Cathlean Cower, last visit 02/15/16 for follow-up from 02/07/16 ED visit for neck pain with radiation towards bilateral shoulders and upper chest. D-dimer and troponin were negative. Pain improved after Valium and Dialudid and felt possible from muscle spasm or radicular pain. She was discharged with a muscle relaxant with plans for c-spine MRI out-patient (as her PCP had already ordered for associated symptoms).  C-spine MRI on 02/09/16 showed C2-3 disc protrusion abutting the left ventral paracentral cervical spinal cord, and she was referred for surgical intervention. Neck and chest symptoms have overall improved since taking scheduled muscle relaxant. She is also having to take Tylenol #3 PRN for aching in both hands and legs. She is also having weakness in her arms and difficulty grasping objects at this point. Her last (and only) episode of chest pain since 02/07/16 ED visit was yesterday morning. She had just woken up. Pain was most pronounced in her neck but radiated to both shoulders and then to her chest (bilaterally). She described pain as throbbing or spasm-like. She took Zanaflex and symptoms resolved within 30 minutes. She denied any exertional chest pains, although activity is somewhat limited. She was able to walk down the hospital hallways without CV symptoms. She denied SOB, edema, syncope, nausea, diaphoresis.   Meds include Tylenol No. 3, albuterol, amlodipine, Norco, Protonix, Zanaflex.  BP 128/69   Pulse 73   Temp 36.9 C   Resp 20   Ht 5\' 2"  (1.575 m)   Wt 146 lb 8 oz  (66.5 kg)   LMP 08/22/2010   SpO2 98%   BMI 26.80 kg/m   Exam shows a pleasant black female in NAD. Heart RRR, no murmur noted. Lungs clear. No carotid bruits noted. No pretibial edema noted.   EKG 02/07/16: SR, RSR prime in V1 or V2, probably normal variant, non-specific repolarization, diffuse leads. Inferolateral ST/T wave abnormality slightly more pronounced when compared to 10-09-2015 tracing (done during ED visit for nephrolithiasis).  CXR 02/07/16: IMPRESSION: No acute cardiopulmonary disease. Significant reverse S-shaped thoracic scoliosis.  MRI c-spine 02/09/16: IMPRESSION: 1. At C2-3 there is a broad left paracentral disc protrusion abutting the left ventral paracentral cervical spinal cord. 2. At C4-5 there is a broad based disc bulge. Bilateral uncovertebral degenerative changes. Moderate bilateral foraminal stenosis, right worse than left. 3. At C5-6 there is a broad-based disc bulge. Bilateral uncovertebral degenerative changes. Mild left foraminal narrowing. Moderate right foraminal narrowing.  Preoperative labs noted.    Patient's chest episodes have been associated with radiating from her neck and bilateral shoulders. She describes radiculopathy symptoms in both arms. She was evaluated in the ED for one episode and was evaluated by her PCP shortly after that and was referred to Neurosurgery for her C2-3 disc protrusion. Zanaflex is helping. Patient without exertional symptoms. Patient without CP at PAT. Discussed with anesthesiologist Dr. Tobias Alexander. If no significant changes then it is anticipated that she can proceed as planned. Discussed with patient that   change/progressivesymptoms would require further medical evaluation.   George Hugh [facility] Short Stay Center/Anesthesiology Phone [phone] 04/04/2016 2:24 PM

## 2016-04-04 NOTE — Progress Notes (Signed)
Medical Md: Cathlean Cower, MD  Cardiologist: patient denies  EKG: 01/2016  ECHO: pt denies  Stress test: pt denies   Cardiac Cath: pt denies  Chest x-ray: 01/2016

## 2016-04-04 NOTE — Pre-Procedure Instructions (Signed)
[name] Walker  04/04/2016      [facility] [address] [place] [address] Phone: [phone] Fax: [phone]  [place] [place] - [id number] [place] [address] Phone: [phone] Fax: [phone]    Your procedure is scheduled on Fri, Feb 9 @ 1:40 PM  Report to [facility] at 11:40 AM  Call this number if you have problems the morning of surgery:  [phone]   Remember:  Do not eat food or drink liquids after midnight.  Take these medicines the morning of surgery with A SIP OF WATER Pain Pill(if needed),Albuterol<Bring Your Inhaler With You>,Amlodipine(Norvasc),Pantoprazole(Protonix), and Ocean Nasal Spray(if needed)             No Goody's,BC's,Aleve,Advil,Motrin,Ibuprofen,Aspirin,Fish Oil,or any Herbal Medications.    Do not wear jewelry, make-up or nail polish.  Do not wear lotions, powders, perfumes, or deoderant.  Do not shave 48 hours prior to surgery.   Do not bring valuables to the hospital.  [facility] is not responsible for any belongings or valuables.  Contacts, dentures or bridgework may not be worn into surgery.  Leave your suitcase in the car.  After surgery it may be brought to your room.  For patients admitted to the hospital, discharge time will be determined by your treatment team.  Patients discharged the day of surgery will not be allowed to drive home.    [facility]  Before surgery, you can play an important role.  Because skin is not sterile, your skin needs to be as free of germs as possible.  You can reduce the number of germs on you skin by washing with CHG (chlorahexidine gluconate) soap before surgery.  CHG is an antiseptic cleaner which kills germs and bonds with the skin to continue killing germs even after washing.  Please DO NOT use  if you have an allergy to CHG or antibacterial soaps.  If your skin becomes reddened/irritated stop using the CHG and inform your nurse when you arrive at Short Stay.  Do not shave (including legs and underarms) for at least 48 hours prior to the first CHG shower.  You may shave your face.  Please follow these instructions carefully:   1.  Shower with CHG Soap the night before surgery and the                                morning of Surgery.  2.  If you choose to wash your hair, wash your hair first as usual with your       normal shampoo.  3.  After you shampoo, rinse your hair and body thoroughly to remove the                      Shampoo.  4.  Use CHG as you would any other liquid soap.  You can apply chg directly       to the skin and wash gently with scrungie or a clean washcloth.  5.  Apply the CHG Soap to your body ONLY FROM THE NECK DOWN.        Do not use on open wounds or open sores.  Avoid contact with your eyes,       ears, mouth and genitals (  private parts).  Wash genitals (private parts)       with your normal soap.  6.  Wash thoroughly, paying special attention to the area where your surgery        will be performed.  7.  Thoroughly rinse your body with warm water from the neck down.  8.  DO NOT shower/wash with your normal soap after using and rinsing off       the CHG Soap.  9.  Pat yourself dry with a clean towel.            10.  Wear clean pajamas.            11.  Place clean sheets on your bed the night of your first shower and do not        sleep with pets.  Day of Surgery  Do not apply any lotions/deoderants the morning of surgery.  Please wear clean clothes to the hospital/surgery center.   Please read over the following fact sheets that you were given. Pain Booklet, Coughing and Deep Breathing, MRSA Information and Surgical Site Infection Prevention

## 2016-04-07 ENCOUNTER — Ambulatory Visit (HOSPITAL_COMMUNITY): Payer: BC Managed Care – PPO

## 2016-04-07 ENCOUNTER — Encounter: Payer: Self-pay | Admitting: Gastroenterology

## 2016-04-07 ENCOUNTER — Encounter (HOSPITAL_COMMUNITY): Payer: Self-pay | Admitting: *Deleted

## 2016-04-07 ENCOUNTER — Encounter (HOSPITAL_COMMUNITY)
Admission: RE | Disposition: A | Discharge status: Home / Self Care | Payer: Self-pay | Source: Ambulatory Visit | Attending: Neurosurgery

## 2016-04-07 ENCOUNTER — Ambulatory Visit (HOSPITAL_COMMUNITY): Payer: BC Managed Care – PPO | Admitting: Vascular Surgery

## 2016-04-07 ENCOUNTER — Ambulatory Visit (HOSPITAL_COMMUNITY)
Admission: RE | Admit: 2016-04-07 | Discharge: 2016-04-08 | Disposition: A | Discharge status: Home / Self Care | Payer: BC Managed Care – PPO | Source: Ambulatory Visit | Attending: Neurosurgery | Admitting: Neurosurgery

## 2016-04-07 DIAGNOSIS — M5021 Other cervical disc displacement,  high cervical region: Secondary | ICD-10-CM | POA: Insufficient documentation

## 2016-04-07 DIAGNOSIS — K219 Gastro-esophageal reflux disease without esophagitis: Secondary | ICD-10-CM | POA: Insufficient documentation

## 2016-04-07 DIAGNOSIS — Z888 Allergy status to other drugs, medicaments and biological substances status: Secondary | ICD-10-CM | POA: Insufficient documentation

## 2016-04-07 DIAGNOSIS — I10 Essential (primary) hypertension: Secondary | ICD-10-CM | POA: Insufficient documentation

## 2016-04-07 DIAGNOSIS — I739 Peripheral vascular disease, unspecified: Secondary | ICD-10-CM | POA: Insufficient documentation

## 2016-04-07 DIAGNOSIS — M4184 Other forms of scoliosis, thoracic region: Secondary | ICD-10-CM | POA: Insufficient documentation

## 2016-04-07 DIAGNOSIS — M5020 Other cervical disc displacement, unspecified cervical region: Secondary | ICD-10-CM | POA: Diagnosis present

## 2016-04-07 DIAGNOSIS — M5030 Other cervical disc degeneration, unspecified cervical region: Secondary | ICD-10-CM

## 2016-04-07 DIAGNOSIS — F17210 Nicotine dependence, cigarettes, uncomplicated: Secondary | ICD-10-CM | POA: Insufficient documentation

## 2016-04-07 DIAGNOSIS — Z419 Encounter for procedure for purposes other than remedying health state, unspecified: Secondary | ICD-10-CM

## 2016-04-07 DIAGNOSIS — M2578 Osteophyte, vertebrae: Secondary | ICD-10-CM | POA: Insufficient documentation

## 2016-04-07 HISTORY — PX: ANTERIOR CERVICAL DECOMP/DISCECTOMY FUSION: SHX1161

## 2016-04-07 SURGERY — ANTERIOR CERVICAL DECOMPRESSION/DISCECTOMY FUSION 1 LEVEL
Anesthesia: General

## 2016-04-07 MED ORDER — FENTANYL CITRATE (PF) 100 MCG/2ML IJ SOLN
INTRAMUSCULAR | Status: AC
  Filled 2016-04-07: qty 4

## 2016-04-07 MED ORDER — AMLODIPINE BESYLATE 5 MG PO TABS
5.0000 mg | ORAL_TABLET | Freq: Every day | ORAL | Status: DC
  Filled 2016-04-07: qty 1

## 2016-04-07 MED ORDER — LIDOCAINE HCL (CARDIAC) 20 MG/ML IV SOLN
INTRAVENOUS | Status: DC | PRN
  Administered 2016-04-07: 60 mg via INTRAVENOUS

## 2016-04-07 MED ORDER — THROMBIN 5000 UNITS EX SOLR
CUTANEOUS | Status: AC
  Filled 2016-04-07: qty 10000

## 2016-04-07 MED ORDER — MENTHOL 3 MG MT LOZG
1.0000 | LOZENGE | OROMUCOSAL | Status: DC | PRN
  Filled 2016-04-07 (×2): qty 9

## 2016-04-07 MED ORDER — SALINE SPRAY 0.65 % NA SOLN
2.0000 | Freq: Every day | NASAL | Status: DC | PRN
  Filled 2016-04-07: qty 44

## 2016-04-07 MED ORDER — LIDOCAINE 2% (20 MG/ML) 5 ML SYRINGE
INTRAMUSCULAR | Status: AC
  Filled 2016-04-07: qty 5

## 2016-04-07 MED ORDER — MIDAZOLAM HCL 2 MG/2ML IJ SOLN
INTRAMUSCULAR | Status: AC
  Filled 2016-04-07: qty 2

## 2016-04-07 MED ORDER — ROCURONIUM BROMIDE 50 MG/5ML IV SOSY
PREFILLED_SYRINGE | INTRAVENOUS | Status: AC
  Filled 2016-04-07: qty 5

## 2016-04-07 MED ORDER — OXYCODONE-ACETAMINOPHEN 5-325 MG PO TABS
1.0000 | ORAL_TABLET | ORAL | Status: DC | PRN
  Administered 2016-04-07 – 2016-04-08 (×2): 2 via ORAL
  Filled 2016-04-07 (×2): qty 2

## 2016-04-07 MED ORDER — HYDROMORPHONE HCL 1 MG/ML IJ SOLN
0.2500 mg | INTRAMUSCULAR | Status: DC | PRN
  Administered 2016-04-07 (×3): 0.5 mg via INTRAVENOUS

## 2016-04-07 MED ORDER — MIDAZOLAM HCL 5 MG/5ML IJ SOLN
INTRAMUSCULAR | Status: DC | PRN
  Administered 2016-04-07: 2 mg via INTRAVENOUS

## 2016-04-07 MED ORDER — OXYCODONE HCL 5 MG PO TABS
ORAL_TABLET | ORAL | Status: AC
  Filled 2016-04-07: qty 1

## 2016-04-07 MED ORDER — OXYCODONE HCL 5 MG/5ML PO SOLN: 5.0000 mg | Freq: Once | ORAL | Status: AC | PRN

## 2016-04-07 MED ORDER — ONDANSETRON HCL 4 MG/2ML IJ SOLN
INTRAMUSCULAR | Status: AC
  Filled 2016-04-07: qty 2

## 2016-04-07 MED ORDER — ONDANSETRON HCL 4 MG/2ML IJ SOLN: 4.0000 mg | INTRAMUSCULAR | Status: DC | PRN

## 2016-04-07 MED ORDER — HEMOSTATIC AGENTS (NO CHARGE) OPTIME
TOPICAL | Status: DC | PRN
  Administered 2016-04-07: 1 via TOPICAL

## 2016-04-07 MED ORDER — FESOTERODINE FUMARATE ER 4 MG PO TB24: 4.0000 mg | ORAL_TABLET | Freq: Every day | ORAL | Status: DC

## 2016-04-07 MED ORDER — FESOTERODINE FUMARATE ER 8 MG PO TB24
8.0000 mg | ORAL_TABLET | Freq: Every day | ORAL | Status: DC
  Filled 2016-04-07: qty 1

## 2016-04-07 MED ORDER — SODIUM CHLORIDE 0.9% FLUSH: 3.0000 mL | INTRAVENOUS | Status: DC | PRN

## 2016-04-07 MED ORDER — HYDROMORPHONE HCL 1 MG/ML IJ SOLN
INTRAMUSCULAR | Status: AC
  Filled 2016-04-07: qty 0.5

## 2016-04-07 MED ORDER — EPHEDRINE SULFATE 50 MG/ML IJ SOLN
INTRAMUSCULAR | Status: DC | PRN
  Administered 2016-04-07 (×2): 10 mg via INTRAVENOUS

## 2016-04-07 MED ORDER — TIZANIDINE HCL 4 MG PO TABS
4.0000 mg | ORAL_TABLET | Freq: Three times a day (TID) | ORAL | Status: DC
  Administered 2016-04-07 (×2): 4 mg via ORAL
  Filled 2016-04-07 (×2): qty 1

## 2016-04-07 MED ORDER — HYDROCHLOROTHIAZIDE 25 MG PO TABS: 25.0000 mg | ORAL_TABLET | Freq: Every day | ORAL | Status: DC

## 2016-04-07 MED ORDER — FENTANYL CITRATE (PF) 100 MCG/2ML IJ SOLN
INTRAMUSCULAR | Status: DC | PRN
  Administered 2016-04-07 (×4): 50 ug via INTRAVENOUS

## 2016-04-07 MED ORDER — HYDROCODONE-ACETAMINOPHEN 5-325 MG PO TABS
1.0000 | ORAL_TABLET | Freq: Four times a day (QID) | ORAL | Status: DC | PRN
  Filled 2016-04-07: qty 1

## 2016-04-07 MED ORDER — PHENOL 1.4 % MT LIQD
1.0000 | OROMUCOSAL | Status: DC | PRN
  Filled 2016-04-07: qty 177

## 2016-04-07 MED ORDER — LACTULOSE 10 GM/15ML PO SOLN
30.0000 g | Freq: Two times a day (BID) | ORAL | Status: DC | PRN
  Filled 2016-04-07: qty 45

## 2016-04-07 MED ORDER — LIDOCAINE-EPINEPHRINE 0.5 %-1:200000 IJ SOLN
INTRAMUSCULAR | Status: DC | PRN
  Administered 2016-04-07: 3 mL

## 2016-04-07 MED ORDER — OXYCODONE HCL 5 MG PO TABS
5.0000 mg | ORAL_TABLET | Freq: Once | ORAL | Status: AC | PRN
  Administered 2016-04-07: 5 mg via ORAL

## 2016-04-07 MED ORDER — THROMBIN 5000 UNITS EX SOLR
CUTANEOUS | Status: DC | PRN
  Administered 2016-04-07 (×2): 5000 [IU] via TOPICAL

## 2016-04-07 MED ORDER — ACETAMINOPHEN 325 MG PO TABS: 650.0000 mg | ORAL_TABLET | ORAL | Status: DC | PRN

## 2016-04-07 MED ORDER — ACETAMINOPHEN 650 MG RE SUPP: 650.0000 mg | RECTAL | Status: DC | PRN

## 2016-04-07 MED ORDER — DEXAMETHASONE SODIUM PHOSPHATE 10 MG/ML IJ SOLN
INTRAMUSCULAR | Status: AC
  Filled 2016-04-07: qty 1

## 2016-04-07 MED ORDER — CEFAZOLIN SODIUM-DEXTROSE 2-4 GM/100ML-% IV SOLN
2.0000 g | INTRAVENOUS | Status: AC
  Administered 2016-04-07: 2 g via INTRAVENOUS
  Filled 2016-04-07: qty 100

## 2016-04-07 MED ORDER — ZOLPIDEM TARTRATE 5 MG PO TABS: 5.0000 mg | ORAL_TABLET | Freq: Every evening | ORAL | Status: DC | PRN

## 2016-04-07 MED ORDER — PROPOFOL 10 MG/ML IV BOLUS
INTRAVENOUS | Status: AC
  Filled 2016-04-07: qty 20

## 2016-04-07 MED ORDER — ROCURONIUM BROMIDE 100 MG/10ML IV SOLN
INTRAVENOUS | Status: DC | PRN
  Administered 2016-04-07: 50 mg via INTRAVENOUS

## 2016-04-07 MED ORDER — DEXAMETHASONE SODIUM PHOSPHATE 10 MG/ML IJ SOLN
INTRAMUSCULAR | Status: DC | PRN
  Administered 2016-04-07: 10 mg via INTRAVENOUS

## 2016-04-07 MED ORDER — PROMETHAZINE HCL 25 MG/ML IJ SOLN: 6.2500 mg | INTRAMUSCULAR | Status: DC | PRN

## 2016-04-07 MED ORDER — PHENYLEPHRINE HCL 10 MG/ML IJ SOLN
INTRAMUSCULAR | Status: DC | PRN
  Administered 2016-04-07 (×2): 120 ug via INTRAVENOUS
  Administered 2016-04-07: 80 ug via INTRAVENOUS

## 2016-04-07 MED ORDER — POTASSIUM CHLORIDE IN NACL 20-0.9 MEQ/L-% IV SOLN: INTRAVENOUS | Status: DC

## 2016-04-07 MED ORDER — POTASSIUM CHLORIDE ER 10 MEQ PO TBCR
10.0000 meq | EXTENDED_RELEASE_TABLET | Freq: Every day | ORAL | Status: DC
  Administered 2016-04-07: 10 meq via ORAL
  Filled 2016-04-07 (×3): qty 1

## 2016-04-07 MED ORDER — LIDOCAINE-EPINEPHRINE (PF) 2 %-1:200000 IJ SOLN
INTRAMUSCULAR | Status: AC
  Filled 2016-04-07: qty 20

## 2016-04-07 MED ORDER — ONDANSETRON HCL 4 MG/2ML IJ SOLN
INTRAMUSCULAR | Status: DC | PRN
  Administered 2016-04-07: 4 mg via INTRAVENOUS

## 2016-04-07 MED ORDER — PANTOPRAZOLE SODIUM 40 MG PO TBEC
40.0000 mg | DELAYED_RELEASE_TABLET | Freq: Two times a day (BID) | ORAL | Status: DC
  Administered 2016-04-08: 40 mg via ORAL
  Filled 2016-04-07 (×2): qty 1

## 2016-04-07 MED ORDER — PHENYLEPHRINE 40 MCG/ML (10ML) SYRINGE FOR IV PUSH (FOR BLOOD PRESSURE SUPPORT)
PREFILLED_SYRINGE | INTRAVENOUS | Status: AC
  Filled 2016-04-07: qty 10

## 2016-04-07 MED ORDER — ACETAMINOPHEN-CODEINE #3 300-30 MG PO TABS: 1.0000 | ORAL_TABLET | Freq: Four times a day (QID) | ORAL | 0 refills | Status: DC | PRN

## 2016-04-07 MED ORDER — SODIUM CHLORIDE 0.9 % IV SOLN: 250.0000 mL | INTRAVENOUS | Status: DC

## 2016-04-07 MED ORDER — LACTATED RINGERS IV SOLN
INTRAVENOUS | Status: DC
  Administered 2016-04-07 (×2): via INTRAVENOUS

## 2016-04-07 MED ORDER — EPHEDRINE 5 MG/ML INJ
INTRAVENOUS | Status: AC
  Filled 2016-04-07: qty 10

## 2016-04-07 MED ORDER — ALBUTEROL SULFATE (2.5 MG/3ML) 0.083% IN NEBU: 3.0000 mL | INHALATION_SOLUTION | Freq: Four times a day (QID) | RESPIRATORY_TRACT | Status: DC | PRN

## 2016-04-07 MED ORDER — SODIUM CHLORIDE 0.9% FLUSH
3.0000 mL | Freq: Two times a day (BID) | INTRAVENOUS | Status: DC
  Administered 2016-04-07: 3 mL via INTRAVENOUS

## 2016-04-07 MED ORDER — 0.9 % SODIUM CHLORIDE (POUR BTL) OPTIME
TOPICAL | Status: DC | PRN
  Administered 2016-04-07: 1000 mL

## 2016-04-07 MED ORDER — PHENYLEPHRINE HCL 10 MG/ML IJ SOLN
INTRAVENOUS | Status: DC | PRN
  Administered 2016-04-07: 30 ug/min via INTRAVENOUS

## 2016-04-07 MED ORDER — MORPHINE SULFATE (PF) 4 MG/ML IV SOLN
1.0000 mg | INTRAVENOUS | Status: DC | PRN
  Administered 2016-04-07 (×2): 4 mg via INTRAVENOUS
  Filled 2016-04-07 (×3): qty 1

## 2016-04-07 MED ORDER — PROPOFOL 10 MG/ML IV BOLUS
INTRAVENOUS | Status: DC | PRN
  Administered 2016-04-07: 140 mg via INTRAVENOUS

## 2016-04-07 MED ORDER — SUGAMMADEX SODIUM 200 MG/2ML IV SOLN
INTRAVENOUS | Status: DC | PRN
  Administered 2016-04-07: 140 mg via INTRAVENOUS

## 2016-04-07 MED ORDER — SUGAMMADEX SODIUM 200 MG/2ML IV SOLN
INTRAVENOUS | Status: AC
  Filled 2016-04-07: qty 2

## 2016-04-07 SURGICAL SUPPLY — 71 items
ADH SKN CLS APL DERMABOND .7 (GAUZE/BANDAGES/DRESSINGS) ×1
BLADE CLIPPER SURG (BLADE) IMPLANT
BNDG GAUZE ELAST 4 BULKY (GAUZE/BANDAGES/DRESSINGS) IMPLANT
BUR DRUM 4.0 (BURR) ×1 IMPLANT
BUR MATCHSTICK NEURO 3.0 LAGG (BURR) ×2 IMPLANT
CANISTER SUCT 3000ML PPV (MISCELLANEOUS) ×2 IMPLANT
CARTRIDGE OIL MAESTRO DRILL (MISCELLANEOUS) ×1 IMPLANT
DECANTER SPIKE VIAL GLASS SM (MISCELLANEOUS) ×2 IMPLANT
DERMABOND ADVANCED (GAUZE/BANDAGES/DRESSINGS) ×1
DERMABOND ADVANCED .7 DNX12 (GAUZE/BANDAGES/DRESSINGS) ×1 IMPLANT
DIFFUSER DRILL AIR PNEUMATIC (MISCELLANEOUS) ×2 IMPLANT
DRAPE HALF SHEET 40X57 (DRAPES) ×1 IMPLANT
DRAPE LAPAROTOMY 100X72 PEDS (DRAPES) ×2 IMPLANT
DRAPE MICROSCOPE LEICA (MISCELLANEOUS) ×2 IMPLANT
DRAPE POUCH INSTRU U-SHP 10X18 (DRAPES) ×2 IMPLANT
DURAPREP 6ML APPLICATOR 50/CS (WOUND CARE) ×2 IMPLANT
ELECT COATED BLADE 2.86 ST (ELECTRODE) ×2 IMPLANT
ELECT REM PT RETURN 9FT ADLT (ELECTROSURGICAL) ×2
ELECTRODE REM PT RTRN 9FT ADLT (ELECTROSURGICAL) ×1 IMPLANT
GAUZE SPONGE 4X4 16PLY XRAY LF (GAUZE/BANDAGES/DRESSINGS) IMPLANT
GLOVE BIO SURGEON STRL SZ 6.5 (GLOVE) IMPLANT
GLOVE BIO SURGEON STRL SZ7 (GLOVE) IMPLANT
GLOVE BIO SURGEON STRL SZ7.5 (GLOVE) IMPLANT
GLOVE BIO SURGEON STRL SZ8 (GLOVE) IMPLANT
GLOVE BIO SURGEON STRL SZ8.5 (GLOVE) IMPLANT
GLOVE BIOGEL M 8.0 STRL (GLOVE) IMPLANT
GLOVE ECLIPSE 6.5 STRL STRAW (GLOVE) ×2 IMPLANT
GLOVE ECLIPSE 7.0 STRL STRAW (GLOVE) IMPLANT
GLOVE ECLIPSE 7.5 STRL STRAW (GLOVE) IMPLANT
GLOVE ECLIPSE 8.0 STRL XLNG CF (GLOVE) IMPLANT
GLOVE ECLIPSE 8.5 STRL (GLOVE) IMPLANT
GLOVE EXAM NITRILE LRG STRL (GLOVE) IMPLANT
GLOVE EXAM NITRILE XL STR (GLOVE) IMPLANT
GLOVE EXAM NITRILE XS STR PU (GLOVE) IMPLANT
GLOVE INDICATOR 6.5 STRL GRN (GLOVE) IMPLANT
GLOVE INDICATOR 7.0 STRL GRN (GLOVE) IMPLANT
GLOVE INDICATOR 7.5 STRL GRN (GLOVE) ×1 IMPLANT
GLOVE INDICATOR 8.0 STRL GRN (GLOVE) IMPLANT
GLOVE INDICATOR 8.5 STRL (GLOVE) IMPLANT
GLOVE OPTIFIT SS 8.0 STRL (GLOVE) IMPLANT
GLOVE SS N UNI LF 7.5 STRL (GLOVE) ×1 IMPLANT
GLOVE SURG SS PI 6.5 STRL IVOR (GLOVE) ×2 IMPLANT
GOWN STRL REUS W/ TWL LRG LVL3 (GOWN DISPOSABLE) ×2 IMPLANT
GOWN STRL REUS W/ TWL XL LVL3 (GOWN DISPOSABLE) IMPLANT
GOWN STRL REUS W/TWL 2XL LVL3 (GOWN DISPOSABLE) IMPLANT
GOWN STRL REUS W/TWL LRG LVL3 (GOWN DISPOSABLE) ×4
GOWN STRL REUS W/TWL XL LVL3 (GOWN DISPOSABLE)
KIT BASIN OR (CUSTOM PROCEDURE TRAY) ×2 IMPLANT
KIT ROOM TURNOVER OR (KITS) ×2 IMPLANT
NDL HYPO 25X1 1.5 SAFETY (NEEDLE) ×1 IMPLANT
NDL SPNL 22GX3.5 QUINCKE BK (NEEDLE) ×1 IMPLANT
NEEDLE HYPO 25X1 1.5 SAFETY (NEEDLE) ×2 IMPLANT
NEEDLE SPNL 22GX3.5 QUINCKE BK (NEEDLE) ×2 IMPLANT
NS IRRIG 1000ML POUR BTL (IV SOLUTION) ×2 IMPLANT
OIL CARTRIDGE MAESTRO DRILL (MISCELLANEOUS) ×2
PACK LAMINECTOMY NEURO (CUSTOM PROCEDURE TRAY) ×2 IMPLANT
PAD ARMBOARD 7.5X6 YLW CONV (MISCELLANEOUS) ×6 IMPLANT
PIN DISTRACTION 14MM (PIN) ×4 IMPLANT
PLATE 20MM (Plate) ×1 IMPLANT
RUBBERBAND STERILE (MISCELLANEOUS) ×4 IMPLANT
SCREW 4.0X13 (Screw) IMPLANT
SCREW 4.0X13MM (Screw) ×4 IMPLANT
SPACER PARALLELL CC-ACF 5MM (Bone Implant) ×1 IMPLANT
SPONGE INTESTINAL PEANUT (DISPOSABLE) ×2 IMPLANT
SPONGE SURGIFOAM ABS GEL SZ50 (HEMOSTASIS) ×2 IMPLANT
SUT VIC AB 0 CT1 27 (SUTURE) ×2
SUT VIC AB 0 CT1 27XBRD ANTBC (SUTURE) ×1 IMPLANT
SUT VIC AB 3-0 SH 8-18 (SUTURE) ×2 IMPLANT
TOWEL OR 17X24 6PK STRL BLUE (TOWEL DISPOSABLE) ×2 IMPLANT
TOWEL OR 17X26 10 PK STRL BLUE (TOWEL DISPOSABLE) ×2 IMPLANT
WATER STERILE IRR 1000ML POUR (IV SOLUTION) ×2 IMPLANT

## 2016-04-07 NOTE — Anesthesia Procedure Notes (Signed)
Procedure Name: Intubation Date/Time: 04/07/2016 12:37 PM Performed by: Lance Coon Pre-anesthesia Checklist: Patient identified, Emergency Drugs available, Suction available, Patient being monitored and Timeout performed Patient Re-evaluated:Patient Re-evaluated prior to inductionOxygen Delivery Method: Circle system utilized Preoxygenation: Pre-oxygenation with 100% oxygen Intubation Type: IV induction Ventilation: Mask ventilation without difficulty Laryngoscope Size: Miller and 3 Grade View: Grade II Tube type: Oral Tube size: 7.5 mm Number of attempts: 1 Airway Equipment and Method: Bougie stylet Placement Confirmation: ETT inserted through vocal cords under direct vision,  positive ETCO2 and breath sounds checked- equal and bilateral Secured at: 21 cm Tube secured with: Tape Dental Injury: Teeth and Oropharynx as per pre-operative assessment

## 2016-04-07 NOTE — Progress Notes (Signed)
Orthopedic Tech Progress Note Patient Details:  [name] Walker [birth year] [id number]  Ortho Devices Type of Ortho Device: Soft collar Ortho Device/Splint Location: neck Ortho Device/Splint Interventions: Ordered, Application   Braulio Bosch 04/07/2016, 7:29 PM

## 2016-04-07 NOTE — Transfer of Care (Signed)
Immediate Anesthesia Transfer of Care Note  Patient: [name] Walker  Procedure(s) Performed: Procedure(s): Cervical two-three Anterior cervical decompression/discectomy/fusion (N/A)  Patient Location: PACU  Anesthesia Type:General  Level of Consciousness: awake and patient cooperative  Airway & Oxygen Therapy: Patient Spontanous Breathing  Post-op Assessment: Report given to RN and Post -op Vital signs reviewed and stable  Post vital signs: Reviewed and stable  Last Vitals:  Vitals:   04/07/16 0939 04/07/16 1445  BP: 140/78 (P) 127/86  Pulse: (!) 59   Resp: 20 (P) 20  Temp: 37.1 C (P) 36.1 C    Last Pain:  Vitals:   04/07/16 0939  TempSrc: Oral  PainSc:          Complications: No apparent anesthesia complications

## 2016-04-07 NOTE — Op Note (Signed)
04/07/2016  2:47 PM  PATIENT:  [name] Walker  56 y.o. female with neck and shoulder pain, along with radiating pain into the posterior aspect of her head. 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 Galella was taken to the operating room, intubated, and placed under general anesthesia without difficulty. She was positioned supine with her head in slight extension on a horseshoe headrest. The neck was prepped and draped in a sterile manner. I infiltrated 4 cc's 1/2%lidocaine/1:200,000 strength epinephrine into the planned incision starting from the midline to the medial border of the left sternocleidomastoid muscle. I opened the incision with a 10 blade and dissected sharply through soft tissue to the platysma. I dissected in the plane superior to the platysma both rostrally and caudally. I then opened the platysma in a horizontal fashion with Metzenbaum scissors, and dissected in the inferior plane rostrally and caudally. With both blunt and sharp technique I created an avascular corridor to the cervical spine. I placed a spinal needle(s) in the disc space at 2/3 . I then reflected the longus colli from C2 to C3 and placed self retaining retractors. I opened the disc space(s) at 2/3 with a 15 blade. I removed disc with curettes,  Kerrison punches, and the drill. Using the drill I removed osteophytes and prepared for the decompression.  I decompressed the spinal canal and the C3 root(s) with the drill, Kerrison punches, and the curettes. I used the microscope to aid in microdissection. I removed the posterior longitudinal ligament to fully expose and decompress the thecal sac. I exposed the roots laterally taking down the 2/3 uncovertebral joints. With the decompression complete we moved on to the arthrodesis. We used the drill to level the surfaces of C2, and 3. I removed soft tissue to prepare the disc space and the bony surfaces. I measured the space and placed a 15mm structural allograft into the disc space.  We then placed the anterior instrumentation. I placed 2 screws in each vertebral body through the plate. I locked the screws into place. Intraoperative xray showed the graft, plate, and screws to be in good position. I irrigated the wound, achieved hemostasis, and closed the wound in layers. I approximated the platysma, and the subcuticular plane with vicryl sutures. I used Dermabond for a sterile dressing.   PLAN OF CARE: Admit for overnight observation  PATIENT DISPOSITION:  PACU - hemodynamically stable.   Delay start of Pharmacological VTE agent (>24hrs) due to surgical blood loss or risk of bleeding:  yes

## 2016-04-07 NOTE — Discharge Summary (Signed)
Physician Discharge Summary  Patient ID: JOALY LOSITO MRN: [id number] DOB/AGE: Aug 23, 1960 56 y.o.  Admit date: 04/07/2016 Discharge date: 04/07/2016  Admission Diagnoses:HNP C2/3  Discharge Diagnoses: same Active Problems:   HNP (herniated nucleus pulposus), cervical   Discharged Condition: good  Hospital Course: Mrs. [name] Walker was admitted and taken to the operating room for an uncomplicated ACDF at [id number] for left neck, shoulder, and head pain. Post op her voice is strong, she is tolerating a regular diet, and ambulating well. Her wound is clean,dry, and without signs of infection.  Treatments: surgery: Anterior Cervical decompression C2/3 Arthrodesis C2/3 with 68mm structural allograft Anterior instrumentation(Nuvasive Helix r) C2/3     Discharge Exam: Blood pressure 107/78, pulse 73, temperature 97.4 F (36.3 C), resp. rate (!) 21, height 5\' 2"  (1.575 m), weight 66.2 kg (146 lb), last menstrual period 08/22/2010, SpO2 91 %. General appearance: alert, cooperative, appears stated age and mild distress Neurologic: Alert and oriented X 3, normal strength and tone. Normal symmetric reflexes. 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 Possible tongue swelling    Other Palpitations   ALL MUSCLE RELAXERS-PER PATIENT   Robaxin [methocarbamol] Other (See Comments)   Tongue swelling      Medication List    STOP taking these medications   diazepam 5 MG tablet Commonly known as:  VALIUM     TAKE these medications   acetaminophen-codeine 300-30 MG tablet Commonly known as:  TYLENOL #3 Take 1 tablet by mouth every 6 (six) hours as needed. for pain What changed:  See the new instructions.   albuterol 108 (90 Base) MCG/ACT inhaler Commonly known as:  PROVENTIL  HFA;VENTOLIN HFA Inhale 2 puffs into the lungs every 6 (six) hours as needed for wheezing or shortness of breath.   amLODipine 5 MG tablet Commonly known as:  NORVASC Take 1 tablet (5 mg total) by mouth daily.   desonide 0.05 % cream Commonly known as:  DESOWEN Apply topically 2 (two) times daily. What changed:  how much to take   fesoterodine 4 MG Tb24 tablet Commonly known as:  TOVIAZ Take 1 tablet (4 mg total) by mouth daily.   hydrochlorothiazide 25 MG tablet Commonly known as:  HYDRODIURIL Take 1 tablet (25 mg total) by mouth daily.   HYDROcodone-acetaminophen 5-325 MG tablet Commonly known as:  NORCO Take 1 tablet by mouth every 6 (six) hours as needed for moderate pain.   ibuprofen 600 MG tablet Commonly known as:  ADVIL,MOTRIN Take 1 tablet (600 mg total) by mouth every 6 (six) hours as needed.   lactulose 10 GM/15ML solution Commonly known as:  CHRONULAC Take 45 mLs (30 g total) by mouth 2 (two) times daily as needed for mild constipation.   pantoprazole 40 MG tablet Commonly known as:  PROTONIX Take 1 tablet (40 mg total) by mouth daily. What changed:  when to take this   potassium chloride 10 MEQ tablet Commonly known as:  KLOR-CON 10 Take 1 tablet (10 mEq total) by mouth daily.   sodium chloride 0.65 % Soln nasal spray Commonly known as:  OCEAN Place 2 sprays into both nostrils daily as needed for congestion.   tiZANidine 4 MG capsule Commonly known as:  ZANAFLEX Take 4 mg by mouth 3 (three) times daily.  tolterodine 4 MG 24 hr capsule Commonly known as:  DETROL LA Take 1 capsule (4 mg total) by mouth daily.      Follow-up Information    Tamina Cyphers L, MD Follow up in 3 week(s).   Specialty:  Neurosurgery Why:  please call the office to make an appointment Contact information: [address] [place] [address] [phone]           Signed: Winfield Cunas 04/07/2016, 4:32 PM

## 2016-04-07 NOTE — H&P (Signed)
LMP 08/22/2010  [name] Walker comes in today for evaluation of pain that she has in the back of her neck and behind the ear, mainly on the left side. She speaks about some numbness in the chest. She speaks about spasms in her shoulders. This has been going on for about the last two weeks. She did go to the emergency room as a result of this pain which has been quite severe. She was given Valium and Norco, and said that that helped, but quite honestly, not in a sufficient fashion. She thought she was having a myocardial infarction because of the pain into the left shoulder. An MRI was performed showing a herniated disc at C2-3, and she was sent to me for further evaluation. She says the pain is worse in the neck than it is in the upper extremity. Her neck feels weak. She has had on and off symptoms since the 1990s. She is a patient of mine whom I took to the operating room in 2014 for a lumbar disc herniation.  She does describe weakness in her arms, numbness and tingling in her neck. She has had some bowel and bladder difficulties. She has had loss of consciousness. She is anorexic. REVIEW OF SYSTEMS: Her review of systems is positive for infections, hearing loss, sinus problems, sore throat, shortness of breath, chest pain, swelling in feet, leg pain, nausea, abdominal pain, leg pain at rest, back pain, arm pain, arm weakness, neck pain, kidney stones, anxiety, food allergies, inhalant allergies. FAMILY HISTORY: Mother and father are both deceased at 85 and 35 years of age. Cancer, diabetes, and hypertension is present in the family history. ALLERGIES: WHEN SHE TOOK CYCLOBENZAPRINE SHE HAD SWELLING IN HER MOUTH AND HER TONGUE. She currently is taking Diazepam, Norco, and Amlodipine. PHYSICAL EXAMINATION: Vital signs, height 5 feet, weight 148 pounds, blood pressure is 122/85, pulse 81, respiratory rate is 12, pain is 10/10, temperature is 97.5 degrees Fahrenheit.  On exam she has some mild weakness in the  left triceps, but otherwise normal strength in the upper extremities at 5/5, and when I rechecked the triceps and she gave a better effort, it is normal strength. Pupils are equal, round, and reactive to light. Full extraocular movements, full visual fields. Hearing is intact to voice. Uvula elevates in the midline. Shoulder shrug is normal. Tongue protrudes in the midline. Reflex is brisk, 3+ in the upper extremities, 2+ in the lower extremities, I did not get a cross abductor, nor a suprapatellar jerk. No reflex at the left ankle. Gait is normal. Romberg test is normal. Speech is clear and is fluent. IMAGING STUDIES: MRI shows a fairly large herniated disc eccentric to the left at C2-3. I think this is the reason for most of her pain, why it comes behind the ear, and why she has this feeling of pain in the neck which is so severe versus the upper extremities. The spondylitic change which is severe and present at C4-5 and C5-6 has been present for the last eight years. She does not have an acute disc rupture there. Spinal cord signal was normal. IMPRESSION/PLAN: I believe that [name] Walker has an HNP at C2-3, and I believe that this is the reason for the pain that she has in the left side of her neck and back of her head. She would like to try physical therapy, so we will proceed with that. We both hope that this will cause some long term improvement, but I did inform her to  call me after two weeks if she sees none. I will see her in the office again in one month unless she makes a phone call earlier.

## 2016-04-07 NOTE — Anesthesia Preprocedure Evaluation (Signed)
Anesthesia Evaluation  Patient identified by MRN, date of birth, ID band Patient awake    Reviewed: Allergy & Precautions, NPO status , Patient's Chart, lab work & pertinent test results  Airway Mallampati: II  TM Distance: >3 FB Neck ROM: Full    Dental  (+) Chipped, Poor Dentition, Dental Advisory Given   Pulmonary Current Smoker,    Pulmonary exam normal breath sounds clear to auscultation       Cardiovascular hypertension, + Peripheral Vascular Disease  Normal cardiovascular exam Rhythm:Regular Rate:Normal     Neuro/Psych negative neurological ROS  negative psych ROS   GI/Hepatic negative GI ROS, Neg liver ROS,   Endo/Other  negative endocrine ROS  Renal/GU negative Renal ROS  negative genitourinary   Musculoskeletal negative musculoskeletal ROS (+)   Abdominal   Peds negative pediatric ROS (+)  Hematology negative hematology ROS (+)   Anesthesia Other Findings   Reproductive/Obstetrics negative OB ROS                             Anesthesia Physical Anesthesia Plan  ASA: II  Anesthesia Plan: General   Post-op Pain Management:    Induction: Intravenous  Airway Management Planned: Oral ETT  Additional Equipment:   Intra-op Plan:   Post-operative Plan: Extubation in OR  Informed Consent: I have reviewed the patients History and Physical, chart, labs and discussed the procedure including the risks, benefits and alternatives for the proposed anesthesia with the patient or authorized representative who has indicated his/her understanding and acceptance.   Dental advisory given  Plan Discussed with: CRNA and Surgeon  Anesthesia Plan Comments:         Anesthesia Quick Evaluation

## 2016-04-07 NOTE — Discharge Instructions (Signed)

## 2016-04-07 NOTE — Anesthesia Postprocedure Evaluation (Addendum)
Anesthesia Post Note  Patient: [name] Walker  Procedure(s) Performed: Procedure(s) (LRB): Cervical two-three Anterior cervical decompression/discectomy/fusion (N/A)  Patient location during evaluation: PACU Anesthesia Type: General Level of consciousness: awake and alert Pain management: pain level controlled Vital Signs Assessment: post-procedure vital signs reviewed and stable Respiratory status: spontaneous breathing, nonlabored ventilation, respiratory function stable and patient connected to nasal cannula oxygen Cardiovascular status: blood pressure returned to baseline and stable Postop Assessment: no signs of nausea or vomiting Anesthetic complications: no       Last Vitals:  Vitals:   04/07/16 1515 04/07/16 1530  BP: 113/79 108/86  Pulse: 85 88  Resp: 18 (!) 21  Temp:      Last Pain:  Vitals:   04/07/16 1528  TempSrc:   PainSc: 7                  Hien Perreira S

## 2016-04-08 DIAGNOSIS — M5021 Other cervical disc displacement,  high cervical region: Secondary | ICD-10-CM | POA: Diagnosis not present

## 2016-04-08 MED ORDER — MORPHINE SULFATE (PF) 4 MG/ML IV SOLN
4.0000 mg | Freq: Once | INTRAVENOUS | Status: AC
  Administered 2016-04-08: 4 mg via INTRAMUSCULAR

## 2016-04-08 NOTE — Progress Notes (Signed)
Pt and daughter given D/C instructions with Rx, verbal understanding was provided. Pt's incision is clean and dry with no sign of infection. Pt's IV was removed prior to D/C. Pt D/C'd home via wheelchair @ 1005 per MD order. Pt is stable at D/C and has no other needs at this time. Holli Humbles, RN

## 2016-04-11 ENCOUNTER — Encounter (HOSPITAL_COMMUNITY): Payer: Self-pay | Admitting: Neurosurgery

## 2016-04-17 ENCOUNTER — Telehealth: Payer: Self-pay | Admitting: Emergency Medicine

## 2016-04-17 NOTE — Telephone Encounter (Signed)
Clarification needed on Amlodipine, Pt state she is to take 2 tablets daily. RX is for 1 tab per day. Please advise.

## 2016-04-17 NOTE — Telephone Encounter (Signed)
Unfortunately, on review of chart I see she has only been on One per day (amlodipine 5 mg) since 2016; should be ok to continue same prescription as she has, and perhaps she is confused with another prescription

## 2016-04-18 NOTE — Telephone Encounter (Signed)
Spoke with Pharmacy and Pt to inform of MDs response.

## 2016-04-28 ENCOUNTER — Ambulatory Visit (INDEPENDENT_AMBULATORY_CARE_PROVIDER_SITE_OTHER): Payer: BC Managed Care – PPO | Admitting: Internal Medicine

## 2016-04-28 ENCOUNTER — Other Ambulatory Visit (INDEPENDENT_AMBULATORY_CARE_PROVIDER_SITE_OTHER): Payer: BC Managed Care – PPO

## 2016-04-28 VITALS — BP 132/72 | HR 92 | Temp 98.1°F | Ht 60.0 in | Wt 148.0 lb

## 2016-04-28 DIAGNOSIS — Z87891 Personal history of nicotine dependence: Secondary | ICD-10-CM

## 2016-04-28 DIAGNOSIS — Z0001 Encounter for general adult medical examination with abnormal findings: Secondary | ICD-10-CM

## 2016-04-28 DIAGNOSIS — J069 Acute upper respiratory infection, unspecified: Secondary | ICD-10-CM

## 2016-04-28 DIAGNOSIS — M79641 Pain in right hand: Secondary | ICD-10-CM | POA: Diagnosis not present

## 2016-04-28 DIAGNOSIS — I10 Essential (primary) hypertension: Secondary | ICD-10-CM | POA: Diagnosis not present

## 2016-04-28 DIAGNOSIS — R7989 Other specified abnormal findings of blood chemistry: Secondary | ICD-10-CM

## 2016-04-28 DIAGNOSIS — M5030 Other cervical disc degeneration, unspecified cervical region: Secondary | ICD-10-CM

## 2016-04-28 DIAGNOSIS — R739 Hyperglycemia, unspecified: Secondary | ICD-10-CM

## 2016-04-28 DIAGNOSIS — M79642 Pain in left hand: Secondary | ICD-10-CM

## 2016-04-28 LAB — CBC WITH DIFFERENTIAL/PLATELET
Basophils Absolute: 0 10*3/uL (ref 0.0–0.1)
Basophils Relative: 0.9 % (ref 0.0–3.0)
Eosinophils Absolute: 0.1 10*3/uL (ref 0.0–0.7)
Eosinophils Relative: 1.9 % (ref 0.0–5.0)
HCT: 41.7 % (ref 36.0–46.0)
Hemoglobin: 13.6 g/dL (ref 12.0–15.0)
Lymphocytes Relative: 53.8 % — ABNORMAL HIGH (ref 12.0–46.0)
Lymphs Abs: 2.8 10*3/uL (ref 0.7–4.0)
MCHC: 32.6 g/dL (ref 30.0–36.0)
MCV: 87.2 fl (ref 78.0–100.0)
Monocytes Absolute: 0.6 10*3/uL (ref 0.1–1.0)
Monocytes Relative: 12.2 % — ABNORMAL HIGH (ref 3.0–12.0)
Neutro Abs: 1.6 10*3/uL (ref 1.4–7.7)
Neutrophils Relative %: 31.2 % — ABNORMAL LOW (ref 43.0–77.0)
Platelets: 143 10*3/uL — ABNORMAL LOW (ref 150.0–400.0)
RBC: 4.78 Mil/uL (ref 3.87–5.11)
RDW: 14.8 % (ref 11.5–15.5)
WBC: 5.2 10*3/uL (ref 4.0–10.5)

## 2016-04-28 LAB — BASIC METABOLIC PANEL
BUN: 14 mg/dL (ref 6–23)
CO2: 27 mEq/L (ref 19–32)
Calcium: 9.4 mg/dL (ref 8.4–10.5)
Chloride: 108 mEq/L (ref 96–112)
Creatinine, Ser: 0.57 mg/dL (ref 0.40–1.20)
GFR: 140.94 mL/min (ref >60.00)
Glucose, Bld: 83 mg/dL (ref 70–99)
Potassium: 3.6 mEq/L (ref 3.5–5.1)
Sodium: 142 mEq/L (ref 135–145)

## 2016-04-28 LAB — SEDIMENTATION RATE: Sed Rate: 11 mm/hr (ref 0–30)

## 2016-04-28 LAB — C-REACTIVE PROTEIN: CRP: 0.2 mg/dL — ABNORMAL LOW (ref 0.5–20.0)

## 2016-04-28 LAB — HEPATIC FUNCTION PANEL
ALT: 24 U/L (ref 0–35)
AST: 16 U/L (ref 0–37)
Albumin: 4.4 g/dL (ref 3.5–5.2)
Alkaline Phosphatase: 122 U/L — ABNORMAL HIGH (ref 39–117)
Bilirubin, Direct: 0.1 mg/dL (ref 0.0–0.3)
Total Bilirubin: 0.4 mg/dL (ref 0.2–1.2)
Total Protein: 7.2 g/dL (ref 6.0–8.3)

## 2016-04-28 LAB — LIPID PANEL
Cholesterol: 193 mg/dL (ref 0–200)
HDL: 39.2 mg/dL (ref >39.00)
Total CHOL/HDL Ratio: 5
Triglycerides: 437 mg/dL — ABNORMAL HIGH (ref 0.0–149.0)

## 2016-04-28 LAB — LDL CHOLESTEROL, DIRECT: Direct LDL: 89 mg/dL

## 2016-04-28 LAB — TSH: TSH: 0.98 u[IU]/mL (ref 0.35–4.50)

## 2016-04-28 LAB — HEMOGLOBIN A1C: Hgb A1c MFr Bld: 5.3 % (ref 4.6–6.5)

## 2016-04-28 MED ORDER — AZITHROMYCIN 250 MG PO TABS: ORAL_TABLET | ORAL | 1 refills | Status: DC

## 2016-04-28 MED ORDER — ACETAMINOPHEN-CODEINE #3 300-30 MG PO TABS: 1.0000 | ORAL_TABLET | Freq: Four times a day (QID) | ORAL | 0 refills | Status: DC | PRN

## 2016-04-28 MED ORDER — DICLOFENAC SODIUM 1 % TD GEL: 4.0000 g | Freq: Four times a day (QID) | TRANSDERMAL | 11 refills | Status: DC | PRN

## 2016-04-28 NOTE — Progress Notes (Signed)
Subjective:    Patient ID: [name] Walker, female    DOB: 06/24/1960, 56 y.o.   MRN: [id number]  HPI   Here with 2-3 days acute onset fever, facial pain, pressure, headache, general weakness and malaise, and greenish d/c, with mild ST and cough, but pt denies chest pain, wheezing, increased sob or doe, orthopnea, PND, increased LE swelling, palpitations, dizziness or syncope.  Pt denies new neurological symptoms such as new headache, or facial or extremity weakness or numbness   Pt denies polydipsia, polyuria, Also here to f/u with c/o bilat hand pain, plain films c/w arthritis per pt per dr Christella Noa. Asks for rheum lab eval, and refill tylenol #3 limited rx, no hx of diversion or abuse.  She is quite proud that she quit smoking cold [place] after told her neck would not heal well with this.  Neck overall much improved, tolerated the tylenol #3. Past Medical History:  Diagnosis Date  . ACHILLES TENDINITIS   . ALLERGIC RHINITIS   . Anginal pain ([place]) 04/04/2016   Pt currently feels like she is having muscle spasms and aching in her chest  . GERD   . HYPERTENSION   . LATERAL EPICONDYLITIS, RIGHT   . LOW BACK PAIN   . Plantar fascial fibromatosis   . SUBACROMIAL BURSITIS, RIGHT   . Thoracic scoliosis 10/01/2015   Past Surgical History:  Procedure Laterality Date  . ANTERIOR CERVICAL DECOMP/DISCECTOMY FUSION N/A 04/07/2016   Procedure: Cervical two-three Anterior cervical decompression/discectomy/fusion;  Surgeon: Ashok Pall, MD;  Location: [facility];  Service: Neurosurgery;  Laterality: N/A;  . BACK SURGERY  2015   lower back  . KIDNEY SURGERY    . TUBAL LIGATION      reports that she has been smoking Cigarettes.  She has a 10.00 pack-year smoking history. She has never used smokeless tobacco. She reports that she does not drink alcohol or use drugs. family history includes Arthritis in her other; Cancer in her other and sister; Diabetes in her other; Hypertension in her other; Stroke in her  other. Allergies  Allergen Reactions  . Flexeril [Cyclobenzaprine] Swelling    Tongue swells  . Lisinopril Other (See Comments)    ? Possible tongue swelling   . Other Palpitations    ALL MUSCLE RELAXERS-PER PATIENT  . Robaxin [Methocarbamol] Other (See Comments)    Tongue swelling   Current Outpatient Prescriptions on File Prior to Visit  Medication Sig Dispense Refill  . albuterol (PROVENTIL HFA;VENTOLIN HFA) 108 (90 Base) MCG/ACT inhaler Inhale 2 puffs into the lungs every 6 (six) hours as needed for wheezing or shortness of breath. 1 Inhaler 11  . amLODipine (NORVASC) 5 MG tablet Take 1 tablet (5 mg total) by mouth daily. 90 tablet 3  . desonide (DESOWEN) 0.05 % cream Apply topically 2 (two) times daily. (Patient taking differently: Apply 1 application topically 2 (two) times daily. ) 30 g 2  . fesoterodine (TOVIAZ) 4 MG TB24 tablet Take 1 tablet (4 mg total) by mouth daily. 90 tablet 3  . hydrochlorothiazide (HYDRODIURIL) 25 MG tablet Take 1 tablet (25 mg total) by mouth daily. 90 tablet 3  . HYDROcodone-acetaminophen (NORCO) 5-325 MG tablet Take 1 tablet by mouth every 6 (six) hours as needed for moderate pain. 60 tablet 0  . ibuprofen (ADVIL,MOTRIN) 600 MG tablet Take 1 tablet (600 mg total) by mouth every 6 (six) hours as needed. 30 tablet 0  . lactulose (CHRONULAC) 10 GM/15ML solution Take 45 mLs (30 g total) by  mouth 2 (two) times daily as needed for mild constipation. 240 mL 3  . pantoprazole (PROTONIX) 40 MG tablet Take 1 tablet (40 mg total) by mouth daily. (Patient taking differently: Take 40 mg by mouth 2 (two) times daily before a meal. ) 90 tablet 3  . potassium chloride (KLOR-CON 10) 10 MEQ tablet Take 1 tablet (10 mEq total) by mouth daily. 90 tablet 3  . sodium chloride (OCEAN) 0.65 % SOLN nasal spray Place 2 sprays into both nostrils daily as needed for congestion.     Marland Kitchen tiZANidine (ZANAFLEX) 4 MG capsule Take 4 mg by mouth 3 (three) times daily.    Marland Kitchen tolterodine  (DETROL LA) 4 MG 24 hr capsule Take 1 capsule (4 mg total) by mouth daily. 90 capsule 3   No current facility-administered medications on file prior to visit.    Review of Systems  Constitutional: Negative for unusual diaphoresis or night sweats HENT: Negative for ear swelling or discharge Eyes: Negative for worsening visual haziness  Respiratory: Negative for choking and stridor.   Gastrointestinal: Negative for distension or worsening eructation Genitourinary: Negative for retention or change in urine volume.  Musculoskeletal: Negative for other MSK pain or swelling Skin: Negative for color change and worsening wound Neurological: Negative for tremors and numbness other than noted  Psychiatric/Behavioral: Negative for decreased concentration or agitation other than above   No other new exam findings    Objective:   Physical Exam BP 132/72   Pulse 92   Temp 98.1 F (36.7 C)   Ht 5' (1.524 m)   Wt 148 lb (67.1 kg)   LMP 08/22/2010   SpO2 98%   BMI 28.90 kg/m  VS noted, mild ill, wearing c-collar Constitutional: Pt appears in no apparent distress HENT: Head: NCAT.  Right Ear: External ear normal.  Left Ear: External ear normal.  Bilat tm's with mild erythema.  Max sinus areas mild tender.  Pharynx with mild erythema, no exudate Eyes: . Pupils are equal, round, and reactive to light. Conjunctivae and EOM are normal Neck: Normal range of motion. Neck supple.  Cardiovascular: Normal rate and regular rhythm.   Pulmonary/Chest: Effort normal and breath sounds without rales or wheezing.  Joint exam without specific effusions, does have prob OA changes in the hands Neurological: Pt is alert. Not confused , motor grossly intact Skin: Skin is warm. No rash, no LE edema Psychiatric: Pt behavior is normal. No agitation.  No other new exam findings    Assessment & Plan:

## 2016-04-28 NOTE — Patient Instructions (Addendum)
Great job with quitting smoking!  Please take all new medication as prescribed - the antibiotic, and the voltaren gel for pain  Please continue all other medications as before, and refills have been done if requested - the tylenol #3 (but this would have to be the last I can do)  Please have the pharmacy call with any other refills you may need.  Please keep your appointments with your specialists as you may have planned  Please go to the LAB in the Basement (turn left off the elevator) for the tests to be done today  You will be contacted by phone if any changes need to be made immediately.  Otherwise, you will receive a letter about your results with an explanation, but please check with MyChart first.  Please remember to sign up for MyChart if you have not done so, as this will be important to you in the future with finding out test results, communicating by private email, and scheduling acute appointments online when needed.  Please return in 6 months, or sooner if needed, with Lab testing done 3-5 days before

## 2016-04-29 DIAGNOSIS — M79642 Pain in left hand: Secondary | ICD-10-CM

## 2016-04-29 DIAGNOSIS — Z720 Tobacco use: Secondary | ICD-10-CM | POA: Insufficient documentation

## 2016-04-29 DIAGNOSIS — M79641 Pain in right hand: Secondary | ICD-10-CM | POA: Insufficient documentation

## 2016-04-29 DIAGNOSIS — F17200 Nicotine dependence, unspecified, uncomplicated: Secondary | ICD-10-CM | POA: Insufficient documentation

## 2016-04-29 DIAGNOSIS — Z87891 Personal history of nicotine dependence: Secondary | ICD-10-CM | POA: Insufficient documentation

## 2016-04-29 NOTE — Assessment & Plan Note (Signed)
Ok for refill tylenol #3, but pt advised this would not be able to be done further

## 2016-04-29 NOTE — Assessment & Plan Note (Signed)
stable overall by history and exam, recent data reviewed with pt, and pt to continue medical treatment as before,  to f/u any worsening symptoms or concerns BP Readings from Last 3 Encounters:  04/28/16 132/72  04/08/16 111/75  04/04/16 128/69

## 2016-04-29 NOTE — Assessment & Plan Note (Signed)
Mild to mod, for antibx course,  to f/u any worsening symptoms or concerns 

## 2016-04-29 NOTE — Assessment & Plan Note (Signed)
Most likely c/w DJD, for volt gel, ok for rheum labs but less likely to be RA

## 2016-04-29 NOTE — Assessment & Plan Note (Signed)
Encouraged pt to continue to stay quit

## 2016-04-29 NOTE — Assessment & Plan Note (Signed)
stable overall by history and exam, recent data reviewed with pt, and pt to continue medical treatment as before,  to f/u any worsening symptoms or concerns Lab Results  Component Value Date   HGBA1C 5.3 04/28/2016

## 2016-05-01 LAB — RHEUMATOID FACTOR: Rhuematoid fact SerPl-aCnc: <14 IU/mL (ref <14)

## 2016-05-01 LAB — ANA: Anti Nuclear Antibody(ANA): NEGATIVE

## 2016-05-04 ENCOUNTER — Other Ambulatory Visit: Payer: Self-pay | Admitting: Internal Medicine

## 2016-05-05 ENCOUNTER — Telehealth: Payer: Self-pay | Admitting: Internal Medicine

## 2016-05-05 NOTE — Telephone Encounter (Signed)
Routing to dr Jenny Reichmann, please advise, I will call patient back

## 2016-05-05 NOTE — Telephone Encounter (Signed)
Blood tests were neg for Rheumatoid, so most likely the arthritis would be related to osteoarthritis .  It could also be gout, but I dont think so at this time. thanks

## 2016-05-05 NOTE — Telephone Encounter (Signed)
Patient called in stating that Dr. Jenny Reichmann was suppose to get back with her in regard to what kind of arthritis she had in her hand.  Please follow up in regard.

## 2016-05-08 NOTE — Telephone Encounter (Signed)
Routing to dr Jenny Reichmann, please advise the treatment for osteoarthritis you would recommend patient doing---thanks

## 2016-05-09 ENCOUNTER — Telehealth: Payer: Self-pay | Admitting: *Deleted

## 2016-05-09 NOTE — Telephone Encounter (Signed)
Rec'd fax pt need PA on Diclofenac 1%. Completely on PA on cover-my-meds (Key: [id number]). Waiting on approval.../lmb

## 2016-05-09 NOTE — Telephone Encounter (Signed)
We discussed at last visit, can address again at next. thanks

## 2016-05-10 NOTE — Telephone Encounter (Signed)
Rec'd PA back med has been approved for dates 05/09/16 through 05/10/2019. Notified [facility] spoke w/tech gave approval status...[name] Walker

## 2016-05-15 ENCOUNTER — Other Ambulatory Visit: Payer: Self-pay | Admitting: Internal Medicine

## 2016-05-28 HISTORY — PX: LITHOTRIPSY: SUR834

## 2016-06-12 ENCOUNTER — Other Ambulatory Visit: Payer: Self-pay | Admitting: Internal Medicine

## 2016-06-13 NOTE — Telephone Encounter (Signed)
Done erx 

## 2016-06-20 ENCOUNTER — Ambulatory Visit (INDEPENDENT_AMBULATORY_CARE_PROVIDER_SITE_OTHER): Payer: BC Managed Care – PPO | Admitting: Nurse Practitioner

## 2016-06-20 ENCOUNTER — Encounter: Payer: Self-pay | Admitting: Nurse Practitioner

## 2016-06-20 VITALS — BP 132/86 | HR 67 | Temp 97.7°F | Ht 60.0 in | Wt 144.0 lb

## 2016-06-20 DIAGNOSIS — N3001 Acute cystitis with hematuria: Secondary | ICD-10-CM

## 2016-06-20 LAB — POCT URINALYSIS DIPSTICK
Bilirubin, UA: NEGATIVE
Glucose, UA: NEGATIVE
Ketones, UA: NEGATIVE
Leukocytes, UA: NEGATIVE
Nitrite, UA: NEGATIVE
Protein, UA: 15
Spec Grav, UA: 1.02 (ref 1.010–1.025)
Urobilinogen, UA: 0.2 E.U./dL
pH, UA: 6 (ref 5.0–8.0)

## 2016-06-20 MED ORDER — CIPROFLOXACIN HCL 500 MG PO TABS: 500.0000 mg | ORAL_TABLET | Freq: Two times a day (BID) | ORAL | 0 refills | Status: DC

## 2016-06-20 MED ORDER — PHENAZOPYRIDINE HCL 100 MG PO TABS: 100.0000 mg | ORAL_TABLET | Freq: Three times a day (TID) | ORAL | 0 refills | Status: DC | PRN

## 2016-06-20 NOTE — Progress Notes (Signed)
Pre visit review using our clinic review tool, if applicable. No additional management support is needed unless otherwise documented below in the visit note. 

## 2016-06-20 NOTE — Progress Notes (Signed)
Subjective:  Patient ID: Caroleen Hamman, female    DOB: 02/04/1961  Age: 56 y.o. MRN: [id number]  CC: Hematuria (blood in urine 1 day,no pain,frequent urinate. allergy?)   Hematuria  This is a new problem. The current episode started yesterday. The problem is unchanged. She describes the hematuria as gross hematuria. The hematuria occurs throughout @his @ entire urinary stream.  She reports no clotting in her urine stream. The pain is mild. She describes her urine color as tea colored. Irritative symptoms include frequency and urgency. Irritative symptoms do not include nocturia. Obstructive symptoms do not include dribbling, incomplete emptying, an intermittent stream, a slower stream, straining or a weak stream. Associated symptoms include dysuria and flank pain. Pertinent negatives include no abdominal pain, chills, facial swelling, fever, genital pain, hesitancy, inability to urinate, nausea or vomiting. She is not sexually active. Her past medical history is significant for hypertension, kidney stones and tobacco use. There is no history of BPH, GU trauma, recent infection, sickle cell disease or STDs.    Outpatient Medications Prior to Visit  Medication Sig Dispense Refill  . acetaminophen-codeine (TYLENOL #3) 300-30 MG tablet Take 1 tablet by mouth every 6 (six) hours as needed. for pain 30 tablet 0  . albuterol (PROVENTIL HFA;VENTOLIN HFA) 108 (90 Base) MCG/ACT inhaler Inhale 2 puffs into the lungs every 6 (six) hours as needed for wheezing or shortness of breath. 1 Inhaler 11  . amLODipine (NORVASC) 5 MG tablet Take 1 tablet (5 mg total) by mouth daily. 90 tablet 3  . desonide (DESOWEN) 0.05 % cream Apply topically 2 (two) times daily. (Patient taking differently: Apply 1 application topically 2 (two) times daily. ) 30 g 2  . diclofenac sodium (VOLTAREN) 1 % GEL Apply 4 g topically 4 (four) times daily as needed. 400 g 11  . fesoterodine (TOVIAZ) 4 MG TB24 tablet Take 1 tablet (4 mg  total) by mouth daily. 90 tablet 3  . hydrochlorothiazide (HYDRODIURIL) 25 MG tablet Take 1 tablet (25 mg total) by mouth daily. 90 tablet 3  . ibuprofen (ADVIL,MOTRIN) 600 MG tablet Take 1 tablet (600 mg total) by mouth every 6 (six) hours as needed. 30 tablet 0  . ibuprofen (ADVIL,MOTRIN) 800 MG tablet TAKE 1 TABLET BY MOUTH EVERY 8 HOURS AS NEEDED FOR MODERATE PAIN 30 tablet 0  . lactulose (CHRONULAC) 10 GM/15ML solution Take 45 mLs (30 g total) by mouth 2 (two) times daily as needed for mild constipation. 240 mL 3  . pantoprazole (PROTONIX) 40 MG tablet Take 1 tablet (40 mg total) by mouth daily. (Patient taking differently: Take 40 mg by mouth 2 (two) times daily before a meal. ) 90 tablet 3  . potassium chloride (KLOR-CON 10) 10 MEQ tablet Take 1 tablet (10 mEq total) by mouth daily. 90 tablet 3  . sodium chloride (OCEAN) 0.65 % SOLN nasal spray Place 2 sprays into both nostrils daily as needed for congestion.     Marland Kitchen tiZANidine (ZANAFLEX) 4 MG capsule Take 4 mg by mouth 3 (three) times daily.    Marland Kitchen tolterodine (DETROL LA) 4 MG 24 hr capsule Take 1 capsule (4 mg total) by mouth daily. 90 capsule 3  . azithromycin (ZITHROMAX Z-PAK) 250 MG tablet 2 tab by mouth day 1, then 1 per day (Patient not taking: Reported on 06/20/2016) 6 tablet 1  . HYDROcodone-acetaminophen (NORCO) 5-325 MG tablet Take 1 tablet by mouth every 6 (six) hours as needed for moderate pain. (Patient not taking: Reported on 06/20/2016)  60 tablet 0   No facility-administered medications prior to visit.     ROS See HPI  Objective:  BP 132/86   Pulse 67   Temp 97.7 F (36.5 C)   Ht 5' (1.524 m)   Wt 144 lb (65.3 kg)   LMP 08/22/2010   SpO2 99%   BMI 28.12 kg/m   BP Readings from Last 3 Encounters:  06/20/16 132/86  04/28/16 132/72  04/08/16 111/75    Wt Readings from Last 3 Encounters:  06/20/16 144 lb (65.3 kg)  04/28/16 148 lb (67.1 kg)  04/07/16 146 lb (66.2 kg)    Physical Exam  Constitutional: She is  oriented to person, place, and time.  Cardiovascular: Normal rate.   Pulmonary/Chest: Effort normal.  Abdominal: Soft. Bowel sounds are normal. She exhibits no distension. There is tenderness.  Left suprapubic tenderness  Musculoskeletal: She exhibits no edema.  Neurological: She is alert and oriented to person, place, and time.  Skin: Skin is warm and dry.  Vitals reviewed.   Lab Results  Component Value Date   WBC 5.2 04/28/2016   HGB 13.6 04/28/2016   HCT 41.7 04/28/2016   PLT 143.0 (L) 04/28/2016   GLUCOSE 83 04/28/2016   CHOL 193 04/28/2016   TRIG (H) 04/28/2016    437.0 Triglyceride is over 400; calculations on Lipids are invalid.   HDL 39.20 04/28/2016   LDLDIRECT 89.0 04/28/2016   LDLCALC 85 07/02/2012   ALT 24 04/28/2016   AST 16 04/28/2016   NA 142 04/28/2016   K 3.6 04/28/2016   CL 108 04/28/2016   CREATININE 0.57 04/28/2016   BUN 14 04/28/2016   CO2 27 04/28/2016   TSH 0.98 04/28/2016   INR 0.98 11/27/2009   HGBA1C 5.3 04/28/2016    No results found.  Assessment & Plan:   Talecia was seen today for hematuria.  Diagnoses and all orders for this visit:  Acute cystitis with hematuria -     POCT urinalysis dipstick -     ciprofloxacin (CIPRO) 500 MG tablet; Take 1 tablet (500 mg total) by mouth 2 (two) times daily. -     phenazopyridine (PYRIDIUM) 100 MG tablet; Take 1 tablet (100 mg total) by mouth 3 (three) times daily as needed for pain.   I am having Ms. Trimm start on ciprofloxacin and phenazopyridine. I am also having her maintain her sodium chloride, albuterol, amLODipine, pantoprazole, desonide, hydrochlorothiazide, potassium chloride, fesoterodine, tolterodine, ibuprofen, HYDROcodone-acetaminophen, lactulose, tiZANidine, acetaminophen-codeine, azithromycin, diclofenac sodium, and ibuprofen.  Meds ordered this encounter  Medications  . ciprofloxacin (CIPRO) 500 MG tablet    Sig: Take 1 tablet (500 mg total) by mouth 2 (two) times daily.     Dispense:  14 tablet    Refill:  0    Order Specific Question:   Supervising Provider    Answer:   Cassandria Anger [[id number]]  . phenazopyridine (PYRIDIUM) 100 MG tablet    Sig: Take 1 tablet (100 mg total) by mouth 3 (three) times daily as needed for pain.    Dispense:  10 tablet    Refill:  0    Order Specific Question:   Supervising Provider    Answer:   Cassandria Anger [[id number]]    Follow-up: Return if symptoms worsen or fail to improve.  Wilfred Lacy, NP

## 2016-06-20 NOTE — Patient Instructions (Signed)
Encourage adequate oral hydration.  Call office if no improvement in 3days.  Separate zanaflex and cipro by at least 4-6hrs to avoid adverse effects from drug interaction.   Acute Urinary Retention, Female Urinary retention means you are unable to pee completely or at all (empty your bladder). Follow these instructions at home:  Drink enough fluids to keep your pee (urine) clear or pale yellow.  If you are sent home with a tube that drains the bladder (catheter), there will be a drainage bag attached to it. There are two types of bags. One is big that you can wear at night without having to empty it. One is smaller and needs to be emptied more often.  Keep the drainage bag emptied.  Keep the drainage bag lower than the tube.  Only take medicine as told by your doctor. Contact a doctor if:  You have a low-grade fever.  You have spasms or you are leaking pee when you have spasms. Get help right away if:  You have chills or a fever.  Your catheter stops draining pee.  Your catheter falls out.  You have increased bleeding that does not stop after you have rested and increased the amount of fluids you had been drinking. This information is not intended to replace advice given to you by your health care provider. Make sure you discuss any questions you have with your health care provider. Document Released: 08/02/2007 Document Revised: 07/22/2015 Document Reviewed: 07/25/2012 Elsevier Interactive Patient Education  2017 [employer].

## 2016-07-12 ENCOUNTER — Telehealth: Payer: Self-pay | Admitting: *Deleted

## 2016-07-12 DIAGNOSIS — R319 Hematuria, unspecified: Secondary | ICD-10-CM

## 2016-07-12 NOTE — Telephone Encounter (Signed)
Unless there is worsening pain or fever, we should hold on further antibiotic pending urine studies  Please obtain urine studies, and if negative and still having blood in urine, you would need to be referred to urology

## 2016-07-12 NOTE — Telephone Encounter (Signed)
Pt left msg on triage stating she is still having some blood in her urine, and would like to get another antibiotic...[name] Walker

## 2016-07-13 ENCOUNTER — Other Ambulatory Visit: Payer: Self-pay | Admitting: Internal Medicine

## 2016-07-13 ENCOUNTER — Other Ambulatory Visit (INDEPENDENT_AMBULATORY_CARE_PROVIDER_SITE_OTHER): Payer: BC Managed Care – PPO

## 2016-07-13 DIAGNOSIS — R319 Hematuria, unspecified: Secondary | ICD-10-CM | POA: Diagnosis not present

## 2016-07-13 LAB — URINALYSIS, ROUTINE W REFLEX MICROSCOPIC
Ketones, ur: NEGATIVE
Nitrite: POSITIVE — AB
Specific Gravity, Urine: 1.02 (ref 1.000–1.030)
Total Protein, Urine: 30 — AB
Urine Glucose: NEGATIVE
Urobilinogen, UA: 0.2 (ref 0.0–1.0)
pH: 6 (ref 5.0–8.0)

## 2016-07-13 MED ORDER — CEPHALEXIN 500 MG PO CAPS: 500.0000 mg | ORAL_CAPSULE | Freq: Three times a day (TID) | ORAL | 0 refills | Status: AC

## 2016-07-13 NOTE — Telephone Encounter (Signed)
Called pt no answer LMOM w/MD response../lmb 

## 2016-07-15 ENCOUNTER — Other Ambulatory Visit: Payer: Self-pay | Admitting: Internal Medicine

## 2016-07-15 LAB — URINE CULTURE

## 2016-07-15 MED ORDER — NITROFURANTOIN MONOHYD MACRO 100 MG PO CAPS: 100.0000 mg | ORAL_CAPSULE | Freq: Two times a day (BID) | ORAL | 0 refills | Status: DC

## 2016-07-31 NOTE — Addendum Note (Signed)
Addendum  created 07/31/16 1059 by Delcia Spitzley, MD   Sign clinical note    

## 2016-08-15 ENCOUNTER — Other Ambulatory Visit: Payer: Self-pay | Admitting: Internal Medicine

## 2016-09-18 ENCOUNTER — Other Ambulatory Visit: Payer: Self-pay | Admitting: Internal Medicine

## 2016-09-19 ENCOUNTER — Ambulatory Visit (INDEPENDENT_AMBULATORY_CARE_PROVIDER_SITE_OTHER): Payer: BC Managed Care – PPO | Admitting: Nurse Practitioner

## 2016-09-19 ENCOUNTER — Encounter: Payer: Self-pay | Admitting: Nurse Practitioner

## 2016-09-19 ENCOUNTER — Other Ambulatory Visit: Payer: BC Managed Care – PPO

## 2016-09-19 ENCOUNTER — Ambulatory Visit (INDEPENDENT_AMBULATORY_CARE_PROVIDER_SITE_OTHER)
Admission: RE | Admit: 2016-09-19 | Discharge: 2016-09-19 | Disposition: A | Discharge status: Home / Self Care | Payer: BC Managed Care – PPO | Source: Ambulatory Visit | Attending: Nurse Practitioner | Admitting: Nurse Practitioner

## 2016-09-19 VITALS — BP 126/84 | HR 64 | Temp 97.8°F | Ht 60.0 in | Wt 147.0 lb

## 2016-09-19 DIAGNOSIS — N390 Urinary tract infection, site not specified: Secondary | ICD-10-CM

## 2016-09-19 DIAGNOSIS — R310 Gross hematuria: Secondary | ICD-10-CM

## 2016-09-19 DIAGNOSIS — N200 Calculus of kidney: Secondary | ICD-10-CM

## 2016-09-19 LAB — POCT URINALYSIS DIPSTICK
Bilirubin, UA: NEGATIVE
Glucose, UA: NEGATIVE
Ketones, UA: NEGATIVE
Nitrite, UA: POSITIVE
Protein, UA: NEGATIVE
Spec Grav, UA: 1.025 (ref 1.010–1.025)
Urobilinogen, UA: 0.2 E.U./dL
pH, UA: 6 (ref 5.0–8.0)

## 2016-09-19 MED ORDER — PHENAZOPYRIDINE HCL 100 MG PO TABS: 100.0000 mg | ORAL_TABLET | Freq: Three times a day (TID) | ORAL | 0 refills | Status: DC | PRN

## 2016-09-19 MED ORDER — NITROFURANTOIN MONOHYD MACRO 100 MG PO CAPS: 100.0000 mg | ORAL_CAPSULE | Freq: Two times a day (BID) | ORAL | 0 refills | Status: AC

## 2016-09-19 MED ORDER — SACCHAROMYCES BOULARDII 250 MG PO CAPS: 250.0000 mg | ORAL_CAPSULE | Freq: Two times a day (BID) | ORAL | 0 refills | Status: DC

## 2016-09-19 NOTE — Patient Instructions (Addendum)
Go to basement for x-ray.  Increase water intake to 8-10 8oz glasses of water.  Maintain proper perineal hygiene (cleaning from front to back).  You will be contacted to schedule appt with urology

## 2016-09-19 NOTE — Progress Notes (Signed)
Subjective:  Patient ID: [name] Walker, female    DOB: 04/21/1960  Age: 56 y.o. MRN: [id number]  CC: Pain (left side pain going toward the back,dark urine--going on for 3 days. )   Urinary Tract Infection   This is a recurrent problem. The current episode started 1 to 4 weeks ago. The problem occurs every urination. The problem has been waxing and waning. The quality of the pain is described as aching and burning. The pain is moderate. There has been no fever. She is sexually active. There is no history of pyelonephritis. Associated symptoms include flank pain, frequency, hematuria and urgency. Pertinent negatives include no chills, discharge, hesitancy, nausea, possible pregnancy, sweats or vomiting. She has tried nothing for the symptoms. Her past medical history is significant for kidney stones, recurrent UTIs and urinary stasis.    Outpatient Medications Prior to Visit  Medication Sig Dispense Refill  . albuterol (PROVENTIL HFA;VENTOLIN HFA) 108 (90 Base) MCG/ACT inhaler Inhale 2 puffs into the lungs every 6 (six) hours as needed for wheezing or shortness of breath. 1 Inhaler 11  . amLODipine (NORVASC) 5 MG tablet Take 1 tablet (5 mg total) by mouth daily. 90 tablet 3  . desonide (DESOWEN) 0.05 % cream Apply topically 2 (two) times daily. (Patient taking differently: Apply 1 application topically 2 (two) times daily. ) 30 g 2  . diclofenac sodium (VOLTAREN) 1 % GEL Apply 4 g topically 4 (four) times daily as needed. 400 g 11  . fesoterodine (TOVIAZ) 4 MG TB24 tablet Take 1 tablet (4 mg total) by mouth daily. 90 tablet 3  . hydrochlorothiazide (HYDRODIURIL) 25 MG tablet Take 1 tablet (25 mg total) by mouth daily. 90 tablet 3  . HYDROcodone-acetaminophen (NORCO) 5-325 MG tablet Take 1 tablet by mouth every 6 (six) hours as needed for moderate pain. 60 tablet 0  . ibuprofen (ADVIL,MOTRIN) 600 MG tablet Take 1 tablet (600 mg total) by mouth every 6 (six) hours as needed. 30 tablet 0  .  ibuprofen (ADVIL,MOTRIN) 800 MG tablet TAKE 1 TABLET BY MOUTH EVERY 8 HOURS AS NEEDED FOR MODERATE PAIN 30 tablet 0  . lactulose (CHRONULAC) 10 GM/15ML solution Take 45 mLs (30 g total) by mouth 2 (two) times daily as needed for mild constipation. 240 mL 3  . pantoprazole (PROTONIX) 40 MG tablet Take 1 tablet (40 mg total) by mouth daily. (Patient taking differently: Take 40 mg by mouth 2 (two) times daily before a meal. ) 90 tablet 3  . potassium chloride (KLOR-CON 10) 10 MEQ tablet Take 1 tablet (10 mEq total) by mouth daily. 90 tablet 3  . sodium chloride (OCEAN) 0.65 % SOLN nasal spray Place 2 sprays into both nostrils daily as needed for congestion.     Marland Kitchen tiZANidine (ZANAFLEX) 4 MG capsule Take 4 mg by mouth 3 (three) times daily.    Marland Kitchen tolterodine (DETROL LA) 4 MG 24 hr capsule Take 1 capsule (4 mg total) by mouth daily. 90 capsule 3  . azithromycin (ZITHROMAX Z-PAK) 250 MG tablet 2 tab by mouth day 1, then 1 per day 6 tablet 1  . ciprofloxacin (CIPRO) 500 MG tablet Take 1 tablet (500 mg total) by mouth 2 (two) times daily. 14 tablet 0  . nitrofurantoin, macrocrystal-monohydrate, (MACROBID) 100 MG capsule Take 1 capsule (100 mg total) by mouth 2 (two) times daily. 20 capsule 0  . phenazopyridine (PYRIDIUM) 100 MG tablet Take 1 tablet (100 mg total) by mouth 3 (three) times daily as needed  for pain. 10 tablet 0  . acetaminophen-codeine (TYLENOL #3) 300-30 MG tablet Take 1 tablet by mouth every 6 (six) hours as needed. for pain (Patient not taking: Reported on 09/19/2016) 30 tablet 0   No facility-administered medications prior to visit.     ROS See HPI  Objective:  BP 126/84   Pulse 64   Temp 97.8 F (36.6 C)   Ht 5' (1.524 m)   Wt 147 lb (66.7 kg)   LMP 08/22/2010   SpO2 99%   BMI 28.71 kg/m   BP Readings from Last 3 Encounters:  09/19/16 126/84  06/20/16 132/86  04/28/16 132/72    Wt Readings from Last 3 Encounters:  09/19/16 147 lb (66.7 kg)  06/20/16 144 lb (65.3 kg)    04/28/16 148 lb (67.1 kg)    Physical Exam  Constitutional: She is oriented to person, place, and time. No distress.  Cardiovascular: Normal rate.   Pulmonary/Chest: Effort normal.  Abdominal: Soft. She exhibits no distension. There is tenderness.  Left CVA tenderness. Suprapubic ABD pain  Neurological: She is alert and oriented to person, place, and time.  Skin: Skin is warm and dry.  Vitals reviewed.   Lab Results  Component Value Date   WBC 5.2 04/28/2016   HGB 13.6 04/28/2016   HCT 41.7 04/28/2016   PLT 143.0 (L) 04/28/2016   GLUCOSE 83 04/28/2016   CHOL 193 04/28/2016   TRIG (H) 04/28/2016    437.0 Triglyceride is over 400; calculations on Lipids are invalid.   HDL 39.20 04/28/2016   LDLDIRECT 89.0 04/28/2016   LDLCALC 85 07/02/2012   ALT 24 04/28/2016   AST 16 04/28/2016   NA 142 04/28/2016   K 3.6 04/28/2016   CL 108 04/28/2016   CREATININE 0.57 04/28/2016   BUN 14 04/28/2016   CO2 27 04/28/2016   TSH 0.98 04/28/2016   INR 0.98 11/27/2009   HGBA1C 5.3 04/28/2016    No results found.  Assessment & Plan:   Hawa was seen today for pain.  Diagnoses and all orders for this visit:  Recurrent UTI -     nitrofurantoin, macrocrystal-monohydrate, (MACROBID) 100 MG capsule; Take 1 capsule (100 mg total) by mouth 2 (two) times daily. -     phenazopyridine (PYRIDIUM) 100 MG tablet; Take 1 tablet (100 mg total) by mouth 3 (three) times daily as needed for pain (with food). -     Cancel: Ambulatory referral to Urology -     Urine Culture -     saccharomyces boulardii (FLORASTOR) 250 MG capsule; Take 1 capsule (250 mg total) by mouth 2 (two) times daily. -     DG Abd 1 View; Future -     Ambulatory referral to Urology  Gross hematuria -     POCT urinalysis dipstick -     Cancel: Ambulatory referral to Urology -     DG Abd 1 View; Future -     Ambulatory referral to Urology  Left nephrolithiasis -     Ambulatory referral to Urology   I have discontinued  Ms. Staebell's acetaminophen-codeine, azithromycin, ciprofloxacin, phenazopyridine, and nitrofurantoin (macrocrystal-monohydrate). I am also having her start on nitrofurantoin (macrocrystal-monohydrate), phenazopyridine, and saccharomyces boulardii. Additionally, I am having her maintain her sodium chloride, albuterol, amLODipine, pantoprazole, desonide, hydrochlorothiazide, potassium chloride, fesoterodine, tolterodine, ibuprofen, HYDROcodone-acetaminophen, lactulose, tiZANidine, diclofenac sodium, ibuprofen, and cyclobenzaprine.  Meds ordered this encounter  Medications  . cyclobenzaprine (FLEXERIL) 10 MG tablet  . nitrofurantoin, macrocrystal-monohydrate, (MACROBID) 100 MG capsule  Sig: Take 1 capsule (100 mg total) by mouth 2 (two) times daily.    Dispense:  20 capsule    Refill:  0    Order Specific Question:   Supervising Provider    Answer:   Cassandria Anger [[id number]]  . phenazopyridine (PYRIDIUM) 100 MG tablet    Sig: Take 1 tablet (100 mg total) by mouth 3 (three) times daily as needed for pain (with food).    Dispense:  10 tablet    Refill:  0    Order Specific Question:   Supervising Provider    Answer:   Cassandria Anger [[id number]]  . saccharomyces boulardii (FLORASTOR) 250 MG capsule    Sig: Take 1 capsule (250 mg total) by mouth 2 (two) times daily.    Dispense:  14 capsule    Refill:  0    Order Specific Question:   Supervising Provider    Answer:   Cassandria Anger [[id number]]    Follow-up: Return if symptoms worsen or fail to improve.  Wilfred Lacy, NP

## 2016-09-21 ENCOUNTER — Other Ambulatory Visit: Payer: Self-pay | Admitting: Urology

## 2016-09-21 LAB — URINE CULTURE: Organism ID, Bacteria: NO GROWTH

## 2016-09-27 ENCOUNTER — Encounter (HOSPITAL_COMMUNITY): Payer: Self-pay | Admitting: *Deleted

## 2016-09-27 NOTE — H&P (Signed)
Office Visit Report     09/21/2016   --------------------------------------------------------------------------------   [name] Walker  MRN: [id number]  PRIMARY CARE:  Cathlean Cower, MD  DOB: 1961-01-16, 56 year old Female  REFERRING:    SSN: **[id number]  PROVIDER:  Carolan Clines, M.D.    TREATING:  Jimmey Ralph    LOCATION:  [facility] [phone]   --------------------------------------------------------------------------------   CC: I have ureteral stone.  HPI: [name] Walker is a 56 year-old female established patient who is here for ureteral stone.  The problem is on the left side. She first stated noticing pain on approximately 09/16/2016. This is not her first kidney stone. She is currently having groin pain. She denies having flank pain, back pain, nausea, vomiting, fever, and chills. Pain is occuring on the left side. She has not caught a stone in her urine strainer since her symptoms began.   She has had ureteral stent and ureteroscopy for treatment of her stones in the past.     CC: I have kidney stones.  HPI: This is not her first kidney stone. She has had 4 stones prior to getting this one. She is currently having groin pain. She denies having flank pain, back pain, nausea, vomiting, fever, and chills. She has not caught a stone in her urine strainer since her symptoms began.     ALLERGIES: Lisinopril TABS - Swelling, Hives OxyCODONE HCl TABS - Nausea    MEDICATIONS: Alprazolam 0.25 mg tablet  Amlodipine Besylate 10 mg tablet  Ciprofloxacin     GU PSH: Cysto Uretero Lithotripsy - 2011 Cystoscopy Insert Stent - 2011      PSH Notes: Kidney Surgery - 1985   NON-GU PSH: No Non-GU PSH    GU PMH: Renal calculus - 10/20/2015 Hydronephrosis Unspec, Hydronephrosis, right - 2014 Ureteral calculus, Calculus of distal right ureter - 2014, Calculus of right ureter, - 2014      PMH Notes:  1898-02-27 00:00:00 - Note: Normal Routine History And  Physical Adult  2006-04-27 13:51:33 - Note: Arthritis   NON-GU PMH: Personal history of other diseases of the digestive system, History of esophageal reflux - 2014    FAMILY HISTORY: Breast Cancer - Sister Carcinoma Of The Stomach - Father Diabetes - Mother, Sister Family Health Status Number - Runs In Family nephrolithiasis - Runs in Family Prostate Cancer - Father Urologic Disorder - Father   SOCIAL HISTORY: Marital Status: Unknown Preferred Language: English; Ethnicity: Not Hispanic Or Latino; Race: Black or African American Current Smoking Status: Patient does not smoke anymore. Has not smoked since 09/28/2014. Smoked for 20 years. Smoked 1/2 pack per day.  Has never drank.  Does not drink caffeine. Patient's occupation Contractor Asst.     Notes: 2 sons, 1 daughter   REVIEW OF SYSTEMS:    GU Review Female:   Patient reports frequent urination. Patient denies hard to postpone urination, burning /pain with urination, get up at night to urinate, leakage of urine, stream starts and stops, trouble starting your stream, have to strain to urinate, and being pregnant.  Gastrointestinal (Upper):   Patient denies nausea, vomiting, and indigestion/ heartburn.  Gastrointestinal (Lower):   Patient reports diarrhea. Patient denies constipation.  Constitutional:   Patient denies fever, night sweats, weight loss, and fatigue.  Skin:   Patient denies skin rash/ lesion and itching.  Eyes:   Patient reports blurred vision. Patient denies double vision.  Ears/ Nose/ Throat:   Patient denies sore throat and sinus problems.  Hematologic/Lymphatic:   Patient denies swollen glands and easy bruising.  Cardiovascular:   Patient reports leg swelling. Patient denies chest pains.  Respiratory:   Patient denies cough and shortness of breath.  Endocrine:   Patient denies excessive thirst.  Musculoskeletal:   Patient reports back pain. Patient denies joint pain.  Neurological:   Patient denies  headaches and dizziness.  Psychologic:   Patient denies anxiety and depression.   VITAL SIGNS:      09/21/2016 10:46 AM  Weight 147 lb / 66.68 kg  Height 70 in / 177.8 cm  BP 126/80 mmHg  Pulse 64 /min  Temperature 97.8 F / 36.5 C  BMI 21.1 kg/m   MULTI-SYSTEM PHYSICAL EXAMINATION:    Constitutional: Appears older than their stated age. Poor dentition. Well-nourished. No physical deformities.   Respiratory: No labored breathing, no use of accessory muscles.   Cardiovascular: Normal temperature, normal extremity pulses, no swelling, no varicosities.   Skin: No paleness, no jaundice, no cyanosis. No lesion, no ulcer, no rash.   Neurologic / Psychiatric: Oriented to time, oriented to place, oriented to person. No depression, no anxiety, no agitation.   Gastrointestinal: No mass, no tenderness, no rigidity, non obese abdomen.      PAST DATA REVIEWED:  Source Of History:  Patient  Lab Test Review:   Stone Analysis  Records Review:   Previous Doctor Records, Previous Patient Records  Urine Test Review:   Urinalysis  X-Ray Review: C.T. Abdomen/Pelvis: Reviewed Report.    Notes:                     2011 stone analysis calcium oxalate   PROCEDURES:         KUB - 74018  A single view of the abdomen is obtained.      No progression of left mid ureteral stone. Still noted at L3.          Urinalysis w/Scope - 81001 Dipstick Dipstick Cont'd Micro  Specimen: Voided Bilirubin: Neg WBC/hpf: 6 - 10/hpf  Color: Orange Ketones: Neg RBC/hpf: 10 - 20/hpf  Appearance: Clear Blood: Trace Bacteria: Few (10-25/hpf)  Specific Gravity: 1.025 Protein: 1+ Cystals: NS (Not Seen)  pH: 6.0 Urobilinogen: 1.0 Casts: NS (Not Seen)  Glucose: Neg Nitrites: Positive Trichomonas: Not Present    Leukocyte Esterase: Trace Mucous: Not Present      Epithelial Cells: 0 - 5/hpf      Yeast: NS (Not Seen)      Sperm: Not Present    ASSESSMENT:      ICD-10 Details  1 GU:   Renal and ureteral calculus - N20.2  Left, Worsening, Chronic - No progression of left mid ureteral stone. Will start pt on Tamsulosin 0.4 mg 1 po daily. Recommend proceeding with elective ESWL. Given strainer. Instructed if stone captured bring to office. Tramadol 50 mg 1 po Q6 hrs prn. Culture urine. Currently on Cipro BID.    PLAN:            Medications New Meds: Tamsulosin Hcl 0.4 mg capsule, ext release 24 hr 1 capsule PO Daily   #30  0 Refill(s)  Tramadol Hcl 50 mg tablet 1 tablet PO Q 6 H PRN   #20  0 Refill(s)    Stop Meds: Hydrochlorothiazide  Discontinue: 09/21/2016  - Reason: The medication cycle was completed.            Orders Labs Urine Culture  X-Rays: KUB  Schedule         Document Letter(s):  Created for Patient: Clinical Summary         Notes:   I went over the procedure of extracorporeal shockwave lithotripsy with the patient in quite detail. She understands the risk of poor fragmentation resulting in further ureteral intervention. She also understands that possibility of needing additional procedures to clear stones. Finally, we discussed the risks of hematoma. The patient is willing to proceed.    ** Signed by Jimmey Ralph on 09/21/16 at 11:33 AM (EDT)**     The information contained in this medical record document is considered private and confidential patient information. This information can only be used for the medical diagnosis and/or medical services that are being provided by the patient's selected caregivers. This information can only be distributed outside of the patient's care if the patient agrees and signs waivers of authorization for this information to be sent to an outside source or route.  Addendum: I reviewed the chart, labs, KUBs and CT. Her urine CX was negative. She is likely passing the stone that was seen in the [facility].

## 2016-09-28 ENCOUNTER — Ambulatory Visit (HOSPITAL_COMMUNITY): Payer: BC Managed Care – PPO

## 2016-09-28 ENCOUNTER — Encounter (HOSPITAL_COMMUNITY)
Admission: RE | Disposition: A | Discharge status: Home / Self Care | Payer: Self-pay | Source: Ambulatory Visit | Attending: Urology

## 2016-09-28 ENCOUNTER — Ambulatory Visit (HOSPITAL_COMMUNITY)
Admission: RE | Admit: 2016-09-28 | Discharge: 2016-09-28 | Disposition: A | Discharge status: Home / Self Care | Payer: BC Managed Care – PPO | Source: Ambulatory Visit | Attending: Urology | Admitting: Urology

## 2016-09-28 ENCOUNTER — Encounter (HOSPITAL_COMMUNITY): Payer: Self-pay | Admitting: *Deleted

## 2016-09-28 DIAGNOSIS — N201 Calculus of ureter: Secondary | ICD-10-CM

## 2016-09-28 DIAGNOSIS — N202 Calculus of kidney with calculus of ureter: Secondary | ICD-10-CM | POA: Diagnosis present

## 2016-09-28 DIAGNOSIS — Z87891 Personal history of nicotine dependence: Secondary | ICD-10-CM | POA: Diagnosis not present

## 2016-09-28 HISTORY — PX: EXTRACORPOREAL SHOCK WAVE LITHOTRIPSY: SHX1557

## 2016-09-28 HISTORY — DX: Personal history of urinary calculi: Z87.442

## 2016-09-28 SURGERY — LITHOTRIPSY, ESWL
Anesthesia: LOCAL | Laterality: Left

## 2016-09-28 MED ORDER — DIPHENHYDRAMINE HCL 25 MG PO CAPS
25.0000 mg | ORAL_CAPSULE | ORAL | Status: AC
  Administered 2016-09-28: 25 mg via ORAL
  Filled 2016-09-28: qty 1

## 2016-09-28 MED ORDER — HYDROMORPHONE HCL-NACL 0.5-0.9 MG/ML-% IV SOSY
0.5000 mg | PREFILLED_SYRINGE | Freq: Once | INTRAVENOUS | Status: DC
  Administered 2016-09-28: 0.5 mg via INTRAVENOUS

## 2016-09-28 MED ORDER — IBUPROFEN 800 MG PO TABS: 800.0000 mg | ORAL_TABLET | Freq: Three times a day (TID) | ORAL | 0 refills | Status: DC | PRN

## 2016-09-28 MED ORDER — SODIUM CHLORIDE 0.9 % IV SOLN: 0.5000 mg/h | INTRAVENOUS | Status: DC

## 2016-09-28 MED ORDER — ONDANSETRON HCL 4 MG/2ML IJ SOLN
4.0000 mg | Freq: Once | INTRAMUSCULAR | Status: AC
  Administered 2016-09-28: 4 mg via INTRAVENOUS
  Filled 2016-09-28: qty 2

## 2016-09-28 MED ORDER — HYDROMORPHONE HCL 1 MG/ML IJ SOLN
0.5000 mg | Freq: Once | INTRAMUSCULAR | Status: DC
  Filled 2016-09-28: qty 1
  Filled 2016-09-28: qty 0.5

## 2016-09-28 MED ORDER — HYDROMORPHONE HCL 1 MG/ML IJ SOLN
0.5000 mg | Freq: Once | INTRAMUSCULAR | Status: DC
  Filled 2016-09-28 (×2): qty 1

## 2016-09-28 MED ORDER — HYDROCODONE-ACETAMINOPHEN 5-325 MG PO TABS: 1.0000 | ORAL_TABLET | ORAL | 0 refills | Status: DC | PRN

## 2016-09-28 MED ORDER — ONDANSETRON HCL 4 MG PO TABS: 4.0000 mg | ORAL_TABLET | Freq: Three times a day (TID) | ORAL | 0 refills | Status: DC | PRN

## 2016-09-28 MED ORDER — SODIUM CHLORIDE 0.9 % IV SOLN
INTRAVENOUS | Status: DC
  Administered 2016-09-28 (×2): via INTRAVENOUS

## 2016-09-28 MED ORDER — CIPROFLOXACIN HCL 500 MG PO TABS
500.0000 mg | ORAL_TABLET | ORAL | Status: AC
  Administered 2016-09-28: 500 mg via ORAL
  Filled 2016-09-28: qty 1

## 2016-09-28 MED ORDER — DIAZEPAM 5 MG PO TABS
10.0000 mg | ORAL_TABLET | ORAL | Status: AC
  Administered 2016-09-28: 10 mg via ORAL
  Filled 2016-09-28: qty 2

## 2016-09-28 MED ORDER — LACTATED RINGERS IV SOLN
INTRAVENOUS | Status: DC
  Administered 2016-09-28: 12:00:00 via INTRAVENOUS

## 2016-09-28 MED ORDER — TAMSULOSIN HCL 0.4 MG PO CAPS
0.4000 mg | ORAL_CAPSULE | Freq: Every day | ORAL | 1 refills | Status: DC
Start: 2016-09-28 — End: 2017-11-06

## 2016-09-28 MED ORDER — HYDROMORPHONE HCL 1 MG/ML IJ SOLN
0.5000 mg | Freq: Once | INTRAMUSCULAR | Status: AC
  Administered 2016-09-28: 0.5 mg via INTRAVENOUS

## 2016-09-28 MED ORDER — HYDROCODONE-ACETAMINOPHEN 5-325 MG PO TABS
2.0000 | ORAL_TABLET | Freq: Once | ORAL | Status: AC
  Administered 2016-09-28: 2 via ORAL
  Filled 2016-09-28: qty 2

## 2016-09-28 NOTE — Progress Notes (Signed)
Pt appears stable; discussed with pt importance of taking BP medication as prescribed when returns home. Pt verbalized understanding. Re interated with pt importance of taking pain on next scheduled time; drink plenty of fluids-1 glass atleast hourly; and contact MD office if increase in pain level, changes in urination, fever or overall status changes. Pt and her family verbalized understanding.

## 2016-09-28 NOTE — Op Note (Signed)
Left proximal ureteral stone: 7-8 mm  Left ESWL   Findings; Pt tolerated well. Stone fragmented well.

## 2016-09-28 NOTE — Progress Notes (Signed)
Pt having difficulty with pain tolerance; paged Dr Junious Silk to make aware of situation.

## 2016-09-28 NOTE — Progress Notes (Signed)
Spoke with Dr Junious Silk in regards to pts pain level. Orders noted.

## 2016-09-28 NOTE — Interval H&P Note (Signed)
History and Physical Interval Note:  09/28/2016 10:23 AM  [name] Walker  has presented today for surgery, with the diagnosis of LEFT MID URETERAL STONE  The various methods of treatment have been discussed with the patient and family. After consideration of risks, benefits and other options for treatment, the patient has consented to  Procedure(s): LEFT EXTRACORPOREAL SHOCK WAVE LITHOTRIPSY (ESWL) (Left) as a surgical intervention .  The patient's history has been reviewed, patient examined, no change in status, stable for surgery. Pt was seen and examined with her daughter in 3-floor pre-op room prior to her KUB. She hasn't passed a stone. Mild dysuria. No fever. Urine cx was negative. No stone passage. I discussed with the patient and her daughter the nature, potential benefits, risks and alternatives to left eswl, including side effects of the proposed treatment, the likelihood of the patient achieving the goals of the procedure, and any potential problems that might occur during the procedure or recuperation. We also discussed there's a small possibility she does not have a ureteral stone as she's had no CT. However, it appears a prior LLP stone has passed. Patient elects to proceed with left ESWL. I have reviewed the patient's chart, imaging and labs.  Questions were answered to the patient's and daughter's satisfaction.     Bentlie Catanzaro

## 2016-09-28 NOTE — Discharge Instructions (Signed)
Moderate Conscious Sedation, Adult, Care After °These instructions provide you with information about caring for yourself after your procedure. Your health care provider may also give you more specific instructions. Your treatment has been planned according to current medical practices, but problems sometimes occur. Call your health care provider if you have any problems or questions after your procedure. °What can I expect after the procedure? °After your procedure, it is common: °· To feel sleepy for several hours. °· To feel clumsy and have poor balance for several hours. °· To have poor judgment for several hours. °· To vomit if you eat too soon. ° °Follow these instructions at home: °For at least 24 hours after the procedure: ° °· Do not: °? Participate in activities where you could fall or become injured. °? Drive. °? Use heavy machinery. °? Drink alcohol. °? Take sleeping pills or medicines that cause drowsiness. °? Make important decisions or sign legal documents. °? Take care of children on your own. °· Rest. °Eating and drinking °· Follow the diet recommended by your health care provider. °· If you vomit: °? Drink water, juice, or soup when you can drink without vomiting. °? Make sure you have little or no nausea before eating solid foods. °General instructions °· Have a responsible adult stay with you until you are awake and alert. °· Take over-the-counter and prescription medicines only as told by your health care provider. °· If you smoke, do not smoke without supervision. °· Keep all follow-up visits as told by your health care provider. This is important. °Contact a health care provider if: °· You keep feeling nauseous or you keep vomiting. °· You feel light-headed. °· You develop a rash. °· You have a fever. °Get help right away if: °· You have trouble breathing. °This information is not intended to replace advice given to you by your health care provider. Make sure you discuss any questions you have  with your health care provider. °Document Released: 12/04/2012 Document Revised: 07/19/2015 Document Reviewed: 06/05/2015 °Elsevier Interactive Patient Education © 2018 [employer] °Lithotripsy, Care After °This sheet gives you information about how to care for yourself after your procedure. Your health care provider may also give you more specific instructions. If you have problems or questions, contact your health care provider. °What can I expect after the procedure? °After the procedure, it is common to have: °· Some blood in your urine. This should only last for a few days. °· Soreness in your back, sides, or upper abdomen for a few days. °· Blotches or bruises on your back where the pressure wave entered the skin. °· Pain, discomfort, or nausea when pieces (fragments) of the kidney stone move through the tube that carries urine from the kidney to the bladder (ureter). Stone fragments may pass soon after the procedure, but they may continue to pass for up to 4-8 weeks. °? If you have severe pain or nausea, contact your health care provider. This may be caused by a large stone that was not broken up, and this may mean that you need more treatment. °· Some pain or discomfort during urination. °· Some pain or discomfort in the lower abdomen or (in men) at the base of the penis. ° °Follow these instructions at home: °Medicines °· Take over-the-counter and prescription medicines only as told by your health care provider. °· If you were prescribed an antibiotic medicine, take it as told by your health care provider. Do not stop taking the antibiotic even if you   start to feel better.  Do not drive for 24 hours if you were given a medicine to help you relax (sedative).  Do not drive or use heavy machinery while taking prescription pain medicine. Eating and drinking  Drink enough water and fluids to keep your urine clear or pale yellow. This helps any remaining pieces of the stone to pass. It can also help  prevent new stones from forming.  Eat plenty of fresh fruits and vegetables.  Follow instructions from your health care provider about eating and drinking restrictions. You may be instructed: ? To reduce how much salt (sodium) you eat or drink. Check ingredients and nutrition facts on packaged foods and beverages. ? To reduce how much meat you eat.  Eat the recommended amount of calcium for your age and gender. Ask your health care provider how much calcium you should have. General instructions  Get plenty of rest.  Most people can resume normal activities 1-2 days after the procedure. Ask your health care provider what activities are safe for you.  If directed, strain all urine through the strainer that was provided by your health care provider. ? Keep all fragments for your health care provider to see. Any stones that are found may be sent to a medical lab for examination. The stone may be as small as a grain of salt.  Keep all follow-up visits as told by your health care provider. This is important. Contact a health care provider if:  You have pain that is severe or does not get better with medicine.  You have nausea that is severe or does not go away.  You have blood in your urine longer than your health care provider told you to expect.  You have more blood in your urine.  You have pain during urination that does not go away.  You urinate more frequently than usual and this does not go away.  You develop a rash or any other possible signs of an allergic reaction. Get help right away if:  You have severe pain in your back, sides, or upper abdomen.  You have severe pain while urinating.  Your urine is very dark red.  You have blood in your stool (feces).  You cannot pass any urine at all.  You feel a strong urge to urinate after emptying your bladder.  You have a fever or chills.  You develop shortness of breath, difficulty breathing, or chest pain.  You have  severe nausea that leads to persistent vomiting.  You faint. Summary  After this procedure, it is common to have some pain, discomfort, or nausea when pieces (fragments) of the kidney stone move through the tube that carries urine from the kidney to the bladder (ureter). If this pain or nausea is severe, however, you should contact your health care provider.  Most people can resume normal activities 1-2 days after the procedure. Ask your health care provider what activities are safe for you.  Drink enough water and fluids to keep your urine clear or pale yellow. This helps any remaining pieces of the stone to pass, and it can help prevent new stones from forming.  If directed, strain your urine and keep all fragments for your health care provider to see. Fragments or stones may be as small as a grain of salt.  Get help right away if you have severe pain in your back, sides, or upper abdomen or have severe pain while urinating. This information is not intended to replace advice  01/05/2016 Document Reviewed: 01/05/2016 °Elsevier Interactive Patient Education © 2017 Elsevier Inc. ° °

## 2016-09-28 NOTE — Progress Notes (Signed)
Spoke with Dr Junious Silk in regards to pts pain situation. Noted that if pt appears stable ok to discharge pt. Discussed pts VS's and that pt has not taken amlodipine in several days.

## 2016-10-17 ENCOUNTER — Other Ambulatory Visit: Payer: Self-pay | Admitting: Internal Medicine

## 2016-11-01 ENCOUNTER — Encounter: Payer: BC Managed Care – PPO | Admitting: Internal Medicine

## 2016-12-27 ENCOUNTER — Other Ambulatory Visit: Payer: Self-pay | Admitting: Internal Medicine

## 2017-03-11 ENCOUNTER — Other Ambulatory Visit: Payer: Self-pay | Admitting: Internal Medicine

## 2017-06-10 ENCOUNTER — Encounter (HOSPITAL_COMMUNITY): Payer: Self-pay | Admitting: Emergency Medicine

## 2017-06-10 ENCOUNTER — Emergency Department (HOSPITAL_COMMUNITY)
Admission: EM | Admit: 2017-06-10 | Discharge: 2017-06-10 | Disposition: A | Discharge status: Home / Self Care | Payer: BC Managed Care – PPO | Attending: Physician Assistant | Admitting: Physician Assistant

## 2017-06-10 DIAGNOSIS — J069 Acute upper respiratory infection, unspecified: Secondary | ICD-10-CM

## 2017-06-10 DIAGNOSIS — Z87891 Personal history of nicotine dependence: Secondary | ICD-10-CM | POA: Diagnosis not present

## 2017-06-10 DIAGNOSIS — S39012A Strain of muscle, fascia and tendon of lower back, initial encounter: Secondary | ICD-10-CM

## 2017-06-10 DIAGNOSIS — J45909 Unspecified asthma, uncomplicated: Secondary | ICD-10-CM | POA: Diagnosis not present

## 2017-06-10 DIAGNOSIS — Y998 Other external cause status: Secondary | ICD-10-CM | POA: Insufficient documentation

## 2017-06-10 DIAGNOSIS — Y929 Unspecified place or not applicable: Secondary | ICD-10-CM | POA: Insufficient documentation

## 2017-06-10 DIAGNOSIS — X58XXXA Exposure to other specified factors, initial encounter: Secondary | ICD-10-CM | POA: Diagnosis not present

## 2017-06-10 DIAGNOSIS — I10 Essential (primary) hypertension: Secondary | ICD-10-CM | POA: Diagnosis not present

## 2017-06-10 DIAGNOSIS — Y9389 Activity, other specified: Secondary | ICD-10-CM | POA: Diagnosis not present

## 2017-06-10 DIAGNOSIS — Z79899 Other long term (current) drug therapy: Secondary | ICD-10-CM | POA: Diagnosis not present

## 2017-06-10 DIAGNOSIS — S3992XA Unspecified injury of lower back, initial encounter: Secondary | ICD-10-CM | POA: Diagnosis present

## 2017-06-10 LAB — URINALYSIS, ROUTINE W REFLEX MICROSCOPIC
Bacteria, UA: NONE SEEN
Bilirubin Urine: NEGATIVE
Glucose, UA: NEGATIVE mg/dL
Hgb urine dipstick: NEGATIVE
Ketones, ur: NEGATIVE mg/dL
Nitrite: NEGATIVE
Protein, ur: NEGATIVE mg/dL
Specific Gravity, Urine: 1.019 (ref 1.005–1.030)
Squamous Epithelial / LPF: NONE SEEN
pH: 5 (ref 5.0–8.0)

## 2017-06-10 MED ORDER — FLUTICASONE PROPIONATE 50 MCG/ACT NA SUSP: 1.0000 | Freq: Every day | NASAL | 2 refills | Status: DC

## 2017-06-10 MED ORDER — NAPROXEN 500 MG PO TABS: 500.0000 mg | ORAL_TABLET | Freq: Two times a day (BID) | ORAL | 0 refills | Status: DC

## 2017-06-10 MED ORDER — BENZONATATE 100 MG PO CAPS: 100.0000 mg | ORAL_CAPSULE | Freq: Three times a day (TID) | ORAL | 0 refills | Status: DC

## 2017-06-10 MED ORDER — LIDOCAINE 5 % EX PTCH: 1.0000 | MEDICATED_PATCH | CUTANEOUS | 0 refills | Status: DC

## 2017-06-10 NOTE — ED Triage Notes (Signed)
Pt c/o urinary frequency and lower back pains for couple days. Denies any blood or pain when urinating.

## 2017-06-10 NOTE — ED Provider Notes (Signed)
[facility] DEPT Provider Note   CSN: [id number] Arrival date & time: 06/10/17  1046     History   Chief Complaint Chief Complaint  Patient presents with  . Urinary Frequency  . Back Pain    HPI [name] Walker is a 57 y.o. female with a past medical history of hypertension, low back pain, nephrolithiasis, who presents to ED for evaluation of 3-day history of lower back pain, urinary frequency, dysuria.  She does state that sometimes the back pain radiates down her left leg.  Denies any injuries or falls, hematuria, fevers, changes in bowel movements, nausea, vomiting. Her next complaint is nasal congestion and bilateral ear pain for the past week.  She has not tried any medications prior to arrival to help with symptoms.  Denies any shortness of breath or chest pain, hemoptysis.  HPI  Past Medical History:  Diagnosis Date  . ACHILLES TENDINITIS   . ALLERGIC RHINITIS   . Anginal pain ([place]) 04/04/2016   Pt currently feels like she is having muscle spasms and aching in her chest  . GERD   . History of kidney stones   . HYPERTENSION   . LATERAL EPICONDYLITIS, RIGHT   . LOW BACK PAIN   . Plantar fascial fibromatosis   . SUBACROMIAL BURSITIS, RIGHT   . Thoracic scoliosis 10/01/2015    Patient Active Problem List   Diagnosis Date Noted  . Former smoker 04/29/2016  . Bilateral hand pain 04/29/2016  . HNP (herniated nucleus pulposus), cervical 04/07/2016  . Cervical radiculopathy, acute 01/29/2016  . Acute upper respiratory infection 01/29/2016  . Degenerative disc disease, cervical 01/11/2016  . Hyperglycemia 11/17/2015  . Angioedema 10/12/2015  . Bilateral lower extremity edema 10/12/2015  . Mouth sores 10/12/2015  . Hypokalemia 10/12/2015  . Thoracic scoliosis 10/01/2015  . Peripheral edema 10/01/2015  . Eczema 10/01/2015  . Asthma 10/01/2015  . Breast lump on right side at 4 o'clock position 01/21/2014  . Hot flashes 10/01/2013  .  Grief reaction 07/17/2012  . Family history of colon cancer 07/02/2012  . OAB (overactive bladder) 03/01/2012  . Encounter for well adult exam with abnormal findings 08/23/2010  . Plantar fasciitis 08/23/2010  . UTI (urinary tract infection) 10/19/2008  . ANKLE PAIN, BILATERAL 10/19/2008  . VAGINITIS 09/10/2008  . JOINT EFFUSION, ANKLE 09/10/2008  . SUBACROMIAL BURSITIS, RIGHT 09/01/2008  . ACHILLES TENDINITIS 09/01/2008  . LATERAL EPICONDYLITIS, RIGHT 08/18/2008  . Essential hypertension 07/12/2007  . ALLERGIC RHINITIS 07/12/2007  . GERD 07/12/2007  . Chronic low back pain 07/12/2007  . NEPHROLITHIASIS, HX OF 07/12/2007    Past Surgical History:  Procedure Laterality Date  . ANTERIOR CERVICAL DECOMP/DISCECTOMY FUSION N/A 04/07/2016   Procedure: Cervical two-three Anterior cervical decompression/discectomy/fusion;  Surgeon: Ashok Pall, MD;  Location: [facility];  Service: Neurosurgery;  Laterality: N/A;  . BACK SURGERY  2015   lower back  . EXTRACORPOREAL SHOCK WAVE LITHOTRIPSY Left 09/28/2016   Procedure: LEFT EXTRACORPOREAL SHOCK WAVE LITHOTRIPSY (ESWL);  Surgeon: Festus Aloe, MD;  Location: [facility];  Service: Urology;  Laterality: Left;  . KIDNEY SURGERY    . LITHOTRIPSY  05/2016  . TUBAL LIGATION       OB History   None      Home Medications    Prior to Admission medications   Medication Sig Start Date End Date Taking? Authorizing Provider  albuterol (PROVENTIL HFA;VENTOLIN HFA) 108 (90 Base) MCG/ACT inhaler Inhale 2 puffs into the lungs every 6 (six) hours as needed  for wheezing or shortness of breath. 10/01/15   Biagio Borg, MD  amLODipine (NORVASC) 5 MG tablet TAKE 1 TABLET BY MOUTH ONCE DAILY 03/12/17   Biagio Borg, MD  benzonatate (TESSALON) 100 MG capsule Take 1 capsule (100 mg total) by mouth every 8 (eight) hours. 06/10/17   Whit Bruni, PA-C  desonide (DESOWEN) 0.05 % cream Apply topically 2 (two) times daily. Patient taking differently: Apply 1 application  topically 2 (two) times daily.  10/01/15   Biagio Borg, MD  diclofenac sodium (VOLTAREN) 1 % GEL Apply 4 g topically 4 (four) times daily as needed. 04/28/16   Biagio Borg, MD  fesoterodine (TOVIAZ) 4 MG TB24 tablet Take 1 tablet (4 mg total) by mouth daily. 11/11/15   Biagio Borg, MD  fluticasone (FLONASE) 50 MCG/ACT nasal spray Place 1 spray into both nostrils daily. 06/10/17   Annalicia Renfrew, PA-C  hydrochlorothiazide (HYDRODIURIL) 25 MG tablet Take 1 tablet (25 mg total) by mouth daily. 10/01/15   Biagio Borg, MD  HYDROcodone-acetaminophen (NORCO) 5-325 MG tablet Take 1 tablet by mouth every 4 (four) hours as needed for moderate pain. 09/28/16   Festus Aloe, MD  ibuprofen (ADVIL,MOTRIN) 800 MG tablet Take 1 tablet (800 mg total) by mouth every 8 (eight) hours as needed. 09/30/16   Festus Aloe, MD  ibuprofen (ADVIL,MOTRIN) 800 MG tablet TAKE 1 TABLET BY MOUTH EVERY 8 HOURS AS NEEDED FOR MODERATE PAIN 12/28/16   Biagio Borg, MD  lactulose (CHRONULAC) 10 GM/15ML solution Take 45 mLs (30 g total) by mouth 2 (two) times daily as needed for mild constipation. 02/25/16   Biagio Borg, MD  lidocaine (LIDODERM) 5 % Place 1 patch onto the skin daily. Remove & Discard patch within 12 hours or as directed by MD 06/10/17   Delia Heady, PA-C  naproxen (NAPROSYN) 500 MG tablet Take 1 tablet (500 mg total) by mouth 2 (two) times daily. 06/10/17   Ethne Jeon, PA-C  ondansetron (ZOFRAN) 4 MG tablet Take 1 tablet (4 mg total) by mouth every 8 (eight) hours as needed for nausea or vomiting. 09/28/16   Festus Aloe, MD  pantoprazole (PROTONIX) 40 MG tablet Take 1 tablet (40 mg total) by mouth daily. Patient taking differently: Take 40 mg by mouth 2 (two) times daily before a meal.  10/01/15   Biagio Borg, MD  phenazopyridine (PYRIDIUM) 100 MG tablet Take 1 tablet (100 mg total) by mouth 3 (three) times daily as needed for pain (with food). 09/19/16   Nche, Charlene Brooke, NP  potassium chloride (KLOR-CON 10)  10 MEQ tablet Take 1 tablet (10 mEq total) by mouth daily. 10/01/15   Biagio Borg, MD  saccharomyces boulardii (FLORASTOR) 250 MG capsule Take 1 capsule (250 mg total) by mouth 2 (two) times daily. 09/19/16   Nche, Charlene Brooke, NP  sodium chloride (OCEAN) 0.65 % SOLN nasal spray Place 2 sprays into both nostrils daily as needed for congestion.     [provider]  tamsulosin (FLOMAX) 0.4 MG CAPS capsule Take 1 capsule (0.4 mg total) by mouth daily after supper. 09/28/16   Festus Aloe, MD  tiZANidine (ZANAFLEX) 4 MG capsule Take 4 mg by mouth 3 (three) times daily. 03/07/16   [provider]  tolterodine (DETROL LA) 4 MG 24 hr capsule Take 1 capsule (4 mg total) by mouth daily. 11/17/15   Biagio Borg, MD    Family History Family History  Problem Relation Age of Onset  .  Cancer Sister        colon cancer  . Arthritis Other   . Diabetes Other   . Hypertension Other   . Cancer Other        breast cancer and prostate cancer  . Stroke Other     Social History Social History   Tobacco Use  . Smoking status: Former Smoker    Packs/day: 0.50    Years: 20.00    Pack years: 10.00    Types: Cigarettes    Last attempt to quit: 02/28/2016    Years since quitting: 1.2  . Smokeless tobacco: Never Used  Substance Use Topics  . Alcohol use: No  . Drug use: No     Allergies   Flexeril [cyclobenzaprine]; Lisinopril; Other; and Robaxin [methocarbamol]   Review of Systems Review of Systems  Constitutional: Negative for appetite change, chills and fever.  HENT: Positive for congestion and ear pain. Negative for rhinorrhea, sneezing and sore throat.   Eyes: Negative for photophobia and visual disturbance.  Respiratory: Negative for cough, chest tightness, shortness of breath and wheezing.   Cardiovascular: Negative for chest pain and palpitations.  Gastrointestinal: Negative for abdominal pain, blood in stool, constipation, diarrhea, nausea and vomiting.  Genitourinary:  Positive for dysuria and frequency. Negative for hematuria and urgency.  Musculoskeletal: Positive for back pain. Negative for myalgias.  Skin: Negative for rash.  Neurological: Negative for dizziness, weakness and light-headedness.     Physical Exam Updated Vital Signs BP (!) 149/98   Pulse 64   Temp 97.9 F (36.6 C) (Oral)   Resp 16   Ht 5' (1.524 m)   Wt 63.5 kg (140 lb)   LMP 08/22/2010   SpO2 100%   BMI 27.34 kg/m   Physical Exam  Constitutional: She appears well-developed and well-nourished. No distress.  Nontoxic appearing and in no acute distress.  HENT:  Head: Normocephalic and atraumatic.  Right Ear: A middle ear effusion is present.  Left Ear: A middle ear effusion is present.  Nose: Nose normal.  Mouth/Throat: Uvula is midline and oropharynx is clear and moist. No tonsillar exudate.  Eyes: Conjunctivae and EOM are normal. Left eye exhibits no discharge. No scleral icterus.  Neck: Normal range of motion. Neck supple.  Cardiovascular: Normal rate, regular rhythm, normal heart sounds and intact distal pulses. Exam reveals no gallop and no friction rub.  No murmur heard. Pulmonary/Chest: Effort normal and breath sounds normal. No respiratory distress.  Abdominal: Soft. Bowel sounds are normal. She exhibits no distension. There is no tenderness. There is no guarding.  Musculoskeletal: Normal range of motion. She exhibits tenderness. She exhibits no edema.       Back:  No midline spinal tenderness present in lumbar, thoracic or cervical spine. No step-off palpated. No visible bruising, edema or temperature change noted. No objective signs of numbness present. No saddle anesthesia. 2+ DP pulses bilaterally. Sensation intact to light touch. Strength 5/5 in bilateral lower extremities.  Neurological: She is alert. She exhibits normal muscle tone. Coordination normal.  Skin: Skin is warm and dry. No rash noted.  Psychiatric: She has a normal mood and affect.  Nursing note  and vitals reviewed.    ED Treatments / Results  Labs (all labs ordered are listed, but only abnormal results are displayed) Labs Reviewed  URINALYSIS, ROUTINE W REFLEX MICROSCOPIC - Abnormal; Notable for the following components:      Result Value   Leukocytes, UA TRACE (*)    All other components within  normal limits  URINE CULTURE    EKG None  Radiology No results found.  Procedures Procedures (including critical care time)  Medications Ordered in ED Medications - No data to display   Initial Impression / Assessment and Plan / ED Course  I have reviewed the triage vital signs and the nursing notes.  Pertinent labs & imaging results that were available during my care of the patient were reviewed by me and considered in my medical decision making (see chart for details).     Patient presents to ED for evaluation of multiple complaints.  First, she states that she has had urinary frequency, dysuria and lower back pain for the past couple of days.  States that sometimes the back pain will radiate down her left leg.  Denies any injuries or falls, numbness in legs, loss of bowel or bladder function, history of IV drug use or fevers.  She has no tenderness to palpation of the bilateral paraspinal musculature with no midline tenderness to palpation noted.  She has no deficits on her neurological examination of the lower extremities.  Urinalysis with no evidence of UTI.  Will be sent for culture.  We will treat empirically for back strain as patient has a history of back pain.  Will treat with muscle relaxer and lidocaine patch.  Low suspicion for cauda equina, dissection or other surgical/infectious cause of symptoms. Her next complaint is nasal congestion, bilateral ear pain and cough.  No evidence of AOM, otitis externa on examination.  She denies any chest pain or shortness of breath.  Will treat with antitussives, Flonase or what appears to be viral URI.  Advised to return for any  severe worsening symptoms.  Portions of this note were generated with [employer]. Dictation errors may occur despite best attempts at proofreading.   Final Clinical Impressions(s) / ED Diagnoses   Final diagnoses:  Strain of lumbar region, initial encounter  Viral URI    ED Discharge Orders        Ordered    naproxen (NAPROSYN) 500 MG tablet  2 times daily     06/10/17 1317    lidocaine (LIDODERM) 5 %  Every 24 hours     06/10/17 1317    benzonatate (TESSALON) 100 MG capsule  Every 8 hours     06/10/17 1317    fluticasone (FLONASE) 50 MCG/ACT nasal spray  Daily     06/10/17 1317       Delia Heady, PA-C 06/10/17 1326    [place], Fredia Sorrow, MD 06/11/17 1558

## 2017-06-12 LAB — URINE CULTURE: Culture: NO GROWTH

## 2017-07-30 ENCOUNTER — Other Ambulatory Visit: Payer: Self-pay | Admitting: Internal Medicine

## 2017-09-06 ENCOUNTER — Encounter: Payer: Self-pay | Admitting: Internal Medicine

## 2017-09-06 ENCOUNTER — Ambulatory Visit: Payer: BC Managed Care – PPO | Admitting: Internal Medicine

## 2017-09-06 ENCOUNTER — Other Ambulatory Visit (INDEPENDENT_AMBULATORY_CARE_PROVIDER_SITE_OTHER): Payer: BC Managed Care – PPO

## 2017-09-06 VITALS — BP 160/98 | HR 73 | Ht 60.0 in | Wt 147.0 lb

## 2017-09-06 DIAGNOSIS — J309 Allergic rhinitis, unspecified: Secondary | ICD-10-CM

## 2017-09-06 DIAGNOSIS — Z800 Family history of malignant neoplasm of digestive organs: Secondary | ICD-10-CM | POA: Diagnosis not present

## 2017-09-06 DIAGNOSIS — Z114 Encounter for screening for human immunodeficiency virus [HIV]: Secondary | ICD-10-CM | POA: Diagnosis not present

## 2017-09-06 DIAGNOSIS — R739 Hyperglycemia, unspecified: Secondary | ICD-10-CM | POA: Diagnosis not present

## 2017-09-06 DIAGNOSIS — Z0000 Encounter for general adult medical examination without abnormal findings: Secondary | ICD-10-CM

## 2017-09-06 DIAGNOSIS — I10 Essential (primary) hypertension: Secondary | ICD-10-CM | POA: Diagnosis not present

## 2017-09-06 LAB — CBC WITH DIFFERENTIAL/PLATELET
Basophils Absolute: 0.1 10*3/uL (ref 0.0–0.1)
Basophils Relative: 0.9 % (ref 0.0–3.0)
Eosinophils Absolute: 0.1 10*3/uL (ref 0.0–0.7)
Eosinophils Relative: 1.7 % (ref 0.0–5.0)
HCT: 46.4 % — ABNORMAL HIGH (ref 36.0–46.0)
Hemoglobin: 15.2 g/dL — ABNORMAL HIGH (ref 12.0–15.0)
Lymphocytes Relative: 36.2 % (ref 12.0–46.0)
Lymphs Abs: 2.2 10*3/uL (ref 0.7–4.0)
MCHC: 32.8 g/dL (ref 30.0–36.0)
MCV: 87.8 fl (ref 78.0–100.0)
Monocytes Absolute: 0.7 10*3/uL (ref 0.1–1.0)
Monocytes Relative: 11.1 % (ref 3.0–12.0)
Neutro Abs: 3 10*3/uL (ref 1.4–7.7)
Neutrophils Relative %: 50.1 % (ref 43.0–77.0)
Platelets: 135 10*3/uL — ABNORMAL LOW (ref 150.0–400.0)
RBC: 5.29 Mil/uL — ABNORMAL HIGH (ref 3.87–5.11)
RDW: 14.8 % (ref 11.5–15.5)
WBC: 6 10*3/uL (ref 4.0–10.5)

## 2017-09-06 LAB — URINALYSIS, ROUTINE W REFLEX MICROSCOPIC
Bilirubin Urine: NEGATIVE
Hgb urine dipstick: NEGATIVE
Leukocytes, UA: NEGATIVE
Nitrite: NEGATIVE
Specific Gravity, Urine: 1.02 (ref 1.000–1.030)
Total Protein, Urine: NEGATIVE
Urine Glucose: NEGATIVE
Urobilinogen, UA: 0.2 (ref 0.0–1.0)
pH: 6 (ref 5.0–8.0)

## 2017-09-06 LAB — LIPID PANEL
Cholesterol: 189 mg/dL (ref 0–200)
HDL: 41.4 mg/dL (ref >39.00)
NonHDL: 147.63
Total CHOL/HDL Ratio: 5
Triglycerides: 316 mg/dL — ABNORMAL HIGH (ref 0.0–149.0)
VLDL: 63.2 mg/dL — ABNORMAL HIGH (ref 0.0–40.0)

## 2017-09-06 LAB — HEPATIC FUNCTION PANEL
ALT: 16 U/L (ref 0–35)
AST: 15 U/L (ref 0–37)
Albumin: 4.3 g/dL (ref 3.5–5.2)
Alkaline Phosphatase: 129 U/L — ABNORMAL HIGH (ref 39–117)
Bilirubin, Direct: 0.1 mg/dL (ref 0.0–0.3)
Total Bilirubin: 0.6 mg/dL (ref 0.2–1.2)
Total Protein: 7.4 g/dL (ref 6.0–8.3)

## 2017-09-06 LAB — BASIC METABOLIC PANEL
BUN: 11 mg/dL (ref 6–23)
CO2: 29 mEq/L (ref 19–32)
Calcium: 9.7 mg/dL (ref 8.4–10.5)
Chloride: 108 mEq/L (ref 96–112)
Creatinine, Ser: 0.81 mg/dL (ref 0.40–1.20)
GFR: 93.5 mL/min (ref >60.00)
Glucose, Bld: 82 mg/dL (ref 70–99)
Potassium: 3.4 mEq/L — ABNORMAL LOW (ref 3.5–5.1)
Sodium: 145 mEq/L (ref 135–145)

## 2017-09-06 LAB — LDL CHOLESTEROL, DIRECT: Direct LDL: 117 mg/dL

## 2017-09-06 LAB — TSH: TSH: 0.42 u[IU]/mL (ref 0.35–4.50)

## 2017-09-06 LAB — HEMOGLOBIN A1C: Hgb A1c MFr Bld: 5.3 % (ref 4.6–6.5)

## 2017-09-06 MED ORDER — AMLODIPINE BESYLATE 5 MG PO TABS: 5.0000 mg | ORAL_TABLET | Freq: Every day | ORAL | 3 refills | Status: DC

## 2017-09-06 MED ORDER — CETIRIZINE HCL 10 MG PO TABS: 10.0000 mg | ORAL_TABLET | Freq: Every day | ORAL | 11 refills | Status: DC

## 2017-09-06 MED ORDER — TRIAMCINOLONE ACETONIDE 55 MCG/ACT NA AERO: 2.0000 | INHALATION_SPRAY | Freq: Every day | NASAL | 12 refills | Status: DC

## 2017-09-06 MED ORDER — IBUPROFEN 800 MG PO TABS: ORAL_TABLET | ORAL | 5 refills | Status: DC

## 2017-09-06 MED ORDER — HYDROCHLOROTHIAZIDE 25 MG PO TABS: 25.0000 mg | ORAL_TABLET | Freq: Every day | ORAL | 3 refills | Status: DC

## 2017-09-06 NOTE — Assessment & Plan Note (Signed)
Due for colonoscopy 

## 2017-09-06 NOTE — Patient Instructions (Addendum)
You will be contacted regarding the referral for: colonoscopy and GYN for pap smear and mammogram  Please take all new medication as prescribed - the zyrtec and nasacort for allergies  Please continue all other medications as before, including restarting your blood pressure medications  Please continue to monitor your Blood Pressure at home, with the goal being to be less thjan 130/80  Please have the pharmacy call with any other refills you may need.  Please continue your efforts at being more active, low cholesterol diet, and weight control.  You are otherwise up to date with prevention measures today.  Please keep your appointments with your specialists as you may have planned  Please go to the LAB in the Basement (turn left off the elevator) for the tests to be done today  You will be contacted by phone if any changes need to be made immediately.  Otherwise, you will receive a letter about your results with an explanation, but please check with MyChart first.  Please remember to sign up for MyChart if you have not done so, as this will be important to you in the future with finding out test results, communicating by private email, and scheduling acute appointments online when needed.  Please return in 6 months, or sooner if needed

## 2017-09-06 NOTE — Assessment & Plan Note (Signed)
For f/u a1c 

## 2017-09-06 NOTE — Assessment & Plan Note (Signed)
[place] for zyrtec and nasacort asd,  to f/u any worsening symptoms or concerns

## 2017-09-06 NOTE — Assessment & Plan Note (Signed)
Uncontrolled, to restart med, cont monitor bp at home and next visit

## 2017-09-06 NOTE — Progress Notes (Signed)
Subjective:    Patient ID: [name] Walker, female    DOB: 1960-10-16, 57 y.o.   MRN: [id number]  HPI  Here for wellness and f/u;  Overall doing ok;  Pt denies Chest pain, worsening SOB, DOE, wheezing, orthopnea, PND, worsening LE edema, palpitations, dizziness or syncope.  Pt denies neurological change such as new headache, facial or extremity weakness.  Pt denies polydipsia, polyuria, or low sugar symptoms. Pt states overall good compliance with treatment and medications, good tolerability, and has been trying to follow appropriate diet.  Pt denies worsening depressive symptoms, suicidal ideation or panic. No fever, night sweats, wt loss, loss of appetite, or other constitutional symptoms.  Pt states good ability with ADL's, has low fall risk, home safety reviewed and adequate, no other significant changes in hearing or vision, and only occasionally active with exercise. Out of amlod and hct for 1 wk, with some incresaed leg swelling. BP < 140/90 at home when taking meds.  Does have several wks ongoing nasal allergy symptoms with clearish congestion, itch and sneezing, without fever, pain, ST, cough, swelling or wheezing. Past Medical History:  Diagnosis Date  . ACHILLES TENDINITIS   . ALLERGIC RHINITIS   . Anginal pain ([place]) 04/04/2016   Pt currently feels like she is having muscle spasms and aching in her chest  . GERD   . History of kidney stones   . HYPERTENSION   . LATERAL EPICONDYLITIS, RIGHT   . LOW BACK PAIN   . Plantar fascial fibromatosis   . SUBACROMIAL BURSITIS, RIGHT   . Thoracic scoliosis 10/01/2015   Past Surgical History:  Procedure Laterality Date  . ANTERIOR CERVICAL DECOMP/DISCECTOMY FUSION N/A 04/07/2016   Procedure: Cervical two-three Anterior cervical decompression/discectomy/fusion;  Surgeon: Ashok Pall, MD;  Location: [facility];  Service: Neurosurgery;  Laterality: N/A;  . BACK SURGERY  2015   lower back  . EXTRACORPOREAL SHOCK WAVE LITHOTRIPSY Left 09/28/2016   Procedure: LEFT EXTRACORPOREAL SHOCK WAVE LITHOTRIPSY (ESWL);  Surgeon: Festus Aloe, MD;  Location: [facility];  Service: Urology;  Laterality: Left;  . KIDNEY SURGERY    . LITHOTRIPSY  05/2016  . TUBAL LIGATION      reports that she quit smoking about 18 months ago. Her smoking use included cigarettes. She has a 10.00 pack-year smoking history. She has never used smokeless tobacco. She reports that she does not drink alcohol or use drugs. family history includes Arthritis in her other; Cancer in her other and sister; Diabetes in her other; Hypertension in her other; Stroke in her other. Allergies  Allergen Reactions  . Flexeril [Cyclobenzaprine] Swelling    Tongue swells  . Lisinopril Other (See Comments)    ? Possible tongue swelling   . Other Palpitations    ALL MUSCLE RELAXERS-PER PATIENT  . Robaxin [Methocarbamol] Other (See Comments)    Tongue swelling   Current Outpatient Medications on File Prior to Visit  Medication Sig Dispense Refill  . albuterol (PROVENTIL HFA;VENTOLIN HFA) 108 (90 Base) MCG/ACT inhaler Inhale 2 puffs into the lungs every 6 (six) hours as needed for wheezing or shortness of breath. 1 Inhaler 11  . desonide (DESOWEN) 0.05 % cream Apply topically 2 (two) times daily. (Patient taking differently: Apply 1 application topically 2 (two) times daily. ) 30 g 2  . diclofenac sodium (VOLTAREN) 1 % GEL Apply 4 g topically 4 (four) times daily as needed. 400 g 11  . fesoterodine (TOVIAZ) 4 MG TB24 tablet Take 1 tablet (4 mg total) by mouth  daily. 90 tablet 3  . fluticasone (FLONASE) 50 MCG/ACT nasal spray Place 1 spray into both nostrils daily. 16 g 2  . HYDROcodone-acetaminophen (NORCO) 5-325 MG tablet Take 1 tablet by mouth every 4 (four) hours as needed for moderate pain. 12 tablet 0  . lactulose (CHRONULAC) 10 GM/15ML solution Take 45 mLs (30 g total) by mouth 2 (two) times daily as needed for mild constipation. 240 mL 3  . lidocaine (LIDODERM) 5 % Place 1 patch onto  the skin daily. Remove & Discard patch within 12 hours or as directed by MD 30 patch 0  . naproxen (NAPROSYN) 500 MG tablet Take 1 tablet (500 mg total) by mouth 2 (two) times daily. 30 tablet 0  . ondansetron (ZOFRAN) 4 MG tablet Take 1 tablet (4 mg total) by mouth every 8 (eight) hours as needed for nausea or vomiting. 12 tablet 0  . pantoprazole (PROTONIX) 40 MG tablet Take 1 tablet (40 mg total) by mouth daily. (Patient taking differently: Take 40 mg by mouth 2 (two) times daily before a meal. ) 90 tablet 3  . phenazopyridine (PYRIDIUM) 100 MG tablet Take 1 tablet (100 mg total) by mouth 3 (three) times daily as needed for pain (with food). 10 tablet 0  . potassium chloride (KLOR-CON 10) 10 MEQ tablet Take 1 tablet (10 mEq total) by mouth daily. 90 tablet 3  . saccharomyces boulardii (FLORASTOR) 250 MG capsule Take 1 capsule (250 mg total) by mouth 2 (two) times daily. 14 capsule 0  . sodium chloride (OCEAN) 0.65 % SOLN nasal spray Place 2 sprays into both nostrils daily as needed for congestion.     . tamsulosin (FLOMAX) 0.4 MG CAPS capsule Take 1 capsule (0.4 mg total) by mouth daily after supper. 14 capsule 1  . tiZANidine (ZANAFLEX) 4 MG capsule Take 4 mg by mouth 3 (three) times daily.    Marland Kitchen tolterodine (DETROL LA) 4 MG 24 hr capsule Take 1 capsule (4 mg total) by mouth daily. 90 capsule 3   No current facility-administered medications on file prior to visit.    Review of Systems Constitutional: Negative for other unusual diaphoresis, sweats, appetite or weight changes HENT: Negative for other worsening hearing loss, ear pain, facial swelling, mouth sores or neck stiffness.   Eyes: Negative for other worsening pain, redness or other visual disturbance.  Respiratory: Negative for other stridor or swelling Cardiovascular: Negative for other palpitations or other chest pain  Gastrointestinal: Negative for worsening diarrhea or loose stools, blood in stool, distention or other  pain Genitourinary: Negative for hematuria, flank pain or other change in urine volume.  Musculoskeletal: Negative for myalgias or other joint swelling.  Skin: Negative for other color change, or other wound or worsening drainage.  Neurological: Negative for other syncope or numbness. Hematological: Negative for other adenopathy or swelling Psychiatric/Behavioral: Negative for hallucinations, other worsening agitation, SI, self-injury, or new decreased concentration All other system neg per pt    Objective:   Physical Exam BP (!) 160/98 (BP Location: Left Arm, Patient Position: Sitting, Cuff Size: Normal)   Pulse 73   Ht 5' (1.524 m)   Wt 147 lb (66.7 kg)   LMP 08/22/2010   SpO2 98%   BMI 28.71 kg/m  VS noted, not ill appearing Constitutional: Pt is oriented to person, place, and time. Appears well-developed and well-nourished, in no significant distress and comfortable Head: Normocephalic and atraumatic  Eyes: Conjunctivae and EOM are normal. Pupils are equal, round, and reactive to light Right  Ear: External ear normal without discharge Left Ear: External ear normal without discharge Nose: Nose without discharge or deformity Bilat tm's with mild erythema.  Max sinus areas non tender.  Pharynx with mild erythema, no exudate Mouth/Throat: Oropharynx is without other ulcerations and moist  Neck: Normal range of motion. Neck supple. No JVD present. No tracheal deviation present or significant neck LA or mass Cardiovascular: Normal rate, regular rhythm, normal heart sounds and intact distal pulses.   Pulmonary/Chest: WOB normal and breath sounds without rales or wheezing  Abdominal: Soft. Bowel sounds are normal. NT. No HSM  Musculoskeletal: Normal range of motion. Exhibits trace bilat leg edema Lymphadenopathy: Has no other cervical adenopathy.  Neurological: Pt is alert and oriented to person, place, and time. Pt has normal reflexes. No cranial nerve deficit. Motor grossly intact,  Gait intact Skin: Skin is warm and dry. No rash noted or new ulcerations Psychiatric:  Has normal mood and affect. Behavior is normal without agitation No other exam findings Lab Results  Component Value Date   WBC 5.2 04/28/2016   HGB 13.6 04/28/2016   HCT 41.7 04/28/2016   PLT 143.0 (L) 04/28/2016   GLUCOSE 83 04/28/2016   CHOL 193 04/28/2016   TRIG (H) 04/28/2016    437.0 Triglyceride is over 400; calculations on Lipids are invalid.   HDL 39.20 04/28/2016   LDLDIRECT 89.0 04/28/2016   LDLCALC 85 07/02/2012   ALT 24 04/28/2016   AST 16 04/28/2016   NA 142 04/28/2016   K 3.6 04/28/2016   CL 108 04/28/2016   CREATININE 0.57 04/28/2016   BUN 14 04/28/2016   CO2 27 04/28/2016   TSH 0.98 04/28/2016   INR 0.98 11/27/2009   HGBA1C 5.3 04/28/2016       Assessment & Plan:

## 2017-09-06 NOTE — Assessment & Plan Note (Signed)

## 2017-09-07 LAB — HIV ANTIBODY (ROUTINE TESTING W REFLEX): HIV 1&2 Ab, 4th Generation: NONREACTIVE

## 2017-09-24 ENCOUNTER — Encounter: Payer: Self-pay | Admitting: Gastroenterology

## 2017-10-02 ENCOUNTER — Encounter: Payer: Self-pay | Admitting: Gynecology

## 2017-10-02 ENCOUNTER — Ambulatory Visit: Payer: BC Managed Care – PPO | Admitting: Gynecology

## 2017-10-02 VITALS — BP 122/80 | Ht 62.0 in | Wt 151.0 lb

## 2017-10-02 DIAGNOSIS — N952 Postmenopausal atrophic vaginitis: Secondary | ICD-10-CM | POA: Diagnosis not present

## 2017-10-02 DIAGNOSIS — Z01419 Encounter for gynecological examination (general) (routine) without abnormal findings: Secondary | ICD-10-CM | POA: Diagnosis not present

## 2017-10-02 DIAGNOSIS — Z1151 Encounter for screening for human papillomavirus (HPV): Secondary | ICD-10-CM | POA: Diagnosis not present

## 2017-10-02 NOTE — Patient Instructions (Signed)
Call to Schedule your mammogram  [facility]. [address] Phone: [phone]     Mammogram A mammogram is an X-ray test to find changes in a woman's breast. You should get a mammogram if:  You are 58 years of age or older  You have risk factors.   Your doctor recommends that you have one.  BEFORE THE TEST  Do not schedule the test the week before your period, especially if your breasts are sore during this time.  On the day of your mammogram:  Wash your breasts and armpits well. After washing, do not put on any deodorant or talcum powder on until after your test.   Eat and drink as you usually do.   Take your medicines as usual.   If you are diabetic and take insulin, make sure you:   Eat before coming for your test.   Take your insulin as usual.   If you cannot keep your appointment, call before the appointment to cancel. Schedule another appointment.  TEST  You will need to undress from the waist up. You will put on a hospital gown.   Your breast will be put on the mammogram machine, and it will press firmly on your breast with a piece of plastic called a compression paddle. This will make your breast flatter so that the machine can X-ray all parts of your breast.   Both breasts will be X-rayed. Each breast will be X-rayed from above and from the side. An X-ray might need to be taken again if the picture is not good enough.   The mammogram will last about 15 to 30 minutes.  AFTER THE TEST Finding out the results of your test Ask when your test results will be ready. Make sure you get your test results.  Document Released: 05/12/2008 Document Revised: 02/02/2011 Document Reviewed: 05/12/2008 [facility] Patient Information 2012 [place].

## 2017-10-02 NOTE — Progress Notes (Signed)
    [name] Walker [birth year] [id number]        57 y.o.  G3P3 new patient for annual gynecologic exam.  Doing well without gynecologic complaints.  Past medical history,surgical history, problem list, medications, allergies, family history and social history were all reviewed and documented as reviewed in the EPIC chart.  ROS:  Performed with pertinent positives and negatives included in the history, assessment and plan.   Additional significant findings : None   Exam: Caryn Bee assistant Vitals:   10/02/17 1413  BP: 122/80  Weight: 151 lb (68.5 kg)  Height: 5\' 2"  (1.575 m)   Body mass index is 27.62 kg/m.  General appearance:  Normal affect, orientation and appearance. Skin: Grossly normal HEENT: Without gross lesions.  No cervical or supraclavicular adenopathy. Thyroid normal.  Lungs:  Clear without wheezing, rales or rhonchi Cardiac: RR, without RMG Abdominal:  Soft, nontender, without masses, guarding, rebound, organomegaly or hernia Breasts:  Examined lying and sitting without masses, retractions, discharge or axillary adenopathy. Pelvic:  Ext, BUS, Vagina: Normal with mild atrophic changes  Cervix: Normal with mild atrophic changes.  Pap smear/HPV  Uterus: Axial to anteverted, normal size, shape and contour, midline and mobile nontender   Adnexa: Without masses or tenderness    Anus and perineum: Normal   Rectovaginal: Normal sphincter tone without palpated masses or tenderness.    Assessment/Plan:  57 y.o. G3P3 female for annual gynecologic exam.   1. Postmenopausal/atrophic genital changes.  No significant hot flushes, night sweats, vaginal dryness or any vaginal bleeding. 2. Mammography 2016.  Reminded patient she is overdue and she agrees to call and schedule.  Breast exam normal today. 3. Colonoscopy 2008.  Patient has scheduled this month. 4. Pap smear a number of years ago.  Pap smear/HPV today.  No history of significant abnormal Pap smears. 5. DEXA  never.  Will plan further into the menopause.  Increased calcium vitamin D reviewed. 6. Health maintenance.  No routine lab work done as patient does this elsewhere.  Follow-up 1 year, sooner as needed.   Anastasio Auerbach MD, 2:42 PM 10/02/2017

## 2017-10-02 NOTE — Addendum Note (Signed)
Addended by: Nelva Nay on: 10/02/2017 03:50 PM   Modules accepted: Orders

## 2017-10-03 LAB — PAP IG AND HPV HIGH-RISK: HPV DNA High Risk: NOT DETECTED

## 2017-10-04 ENCOUNTER — Other Ambulatory Visit: Payer: Self-pay | Admitting: Gynecology

## 2017-10-04 MED ORDER — METRONIDAZOLE 500 MG PO TABS: 500.0000 mg | ORAL_TABLET | Freq: Two times a day (BID) | ORAL | 0 refills | Status: DC

## 2017-10-28 ENCOUNTER — Other Ambulatory Visit: Payer: Self-pay | Admitting: Internal Medicine

## 2017-11-06 ENCOUNTER — Ambulatory Visit (AMBULATORY_SURGERY_CENTER): Payer: Self-pay

## 2017-11-06 VITALS — Ht 62.0 in | Wt 153.0 lb

## 2017-11-06 DIAGNOSIS — Z1211 Encounter for screening for malignant neoplasm of colon: Secondary | ICD-10-CM

## 2017-11-06 MED ORDER — NA SULFATE-K SULFATE-MG SULF 17.5-3.13-1.6 GM/177ML PO SOLN
1.0000 | Freq: Once | ORAL | 0 refills | Status: AC
Start: 2017-11-06 — End: 2017-11-06

## 2017-11-06 NOTE — Progress Notes (Signed)
Denies allergies to eggs or soy products. Denies complication of anesthesia or sedation. Denies use of weight loss medication. Denies use of O2.   Emmi instructions declined.  

## 2017-11-07 ENCOUNTER — Encounter: Payer: Self-pay | Admitting: Gastroenterology

## 2017-11-20 ENCOUNTER — Ambulatory Visit (AMBULATORY_SURGERY_CENTER): Payer: BC Managed Care – PPO | Admitting: Gastroenterology

## 2017-11-20 ENCOUNTER — Encounter: Payer: Self-pay | Admitting: Gastroenterology

## 2017-11-20 VITALS — BP 140/92 | HR 77 | Temp 98.4°F | Resp 20 | Ht 62.0 in | Wt 153.0 lb

## 2017-11-20 DIAGNOSIS — D124 Benign neoplasm of descending colon: Secondary | ICD-10-CM

## 2017-11-20 DIAGNOSIS — K635 Polyp of colon: Secondary | ICD-10-CM | POA: Diagnosis not present

## 2017-11-20 DIAGNOSIS — D123 Benign neoplasm of transverse colon: Secondary | ICD-10-CM

## 2017-11-20 DIAGNOSIS — D125 Benign neoplasm of sigmoid colon: Secondary | ICD-10-CM | POA: Diagnosis not present

## 2017-11-20 DIAGNOSIS — Z1211 Encounter for screening for malignant neoplasm of colon: Secondary | ICD-10-CM | POA: Diagnosis not present

## 2017-11-20 DIAGNOSIS — K219 Gastro-esophageal reflux disease without esophagitis: Secondary | ICD-10-CM

## 2017-11-20 DIAGNOSIS — D122 Benign neoplasm of ascending colon: Secondary | ICD-10-CM

## 2017-11-20 MED ORDER — SODIUM CHLORIDE 0.9 % IV SOLN: 500.0000 mL | Freq: Once | INTRAVENOUS | Status: DC

## 2017-11-20 MED ORDER — PANTOPRAZOLE SODIUM 40 MG PO TBEC: 40.0000 mg | DELAYED_RELEASE_TABLET | Freq: Two times a day (BID) | ORAL | 11 refills | Status: DC

## 2017-11-20 NOTE — Progress Notes (Signed)
Pt's states no medical or surgical changes since previsit or office visit. 

## 2017-11-20 NOTE — Progress Notes (Signed)
Called to room to assist during endoscopic procedure.  Patient ID and intended procedure confirmed with present staff. Received instructions for my participation in the procedure from the performing physician.  

## 2017-11-20 NOTE — Patient Instructions (Signed)
NEW PRESCRIPTION SENT TO [facility] ON [address]. THIS MEDICATION IS TO BE TAKEN 2 X DAILY 30 MINS PRIOR TO MORNING AND EVENING MEAL. HANDOUTS GIVEN FOR GERD AND POLYPS.    YOU HAD AN ENDOSCOPIC PROCEDURE TODAY AT [facility]:   Refer to the procedure report that was given to you for any specific questions about what was found during the examination.  If the procedure report does not answer your questions, please call your gastroenterologist to clarify.  If you requested that your care partner not be given the details of your procedure findings, then the procedure report has been included in a sealed envelope for you to review at your convenience later.  YOU SHOULD EXPECT: Some feelings of bloating in the abdomen. Passage of more gas than usual.  Walking can help get rid of the air that was put into your GI tract during the procedure and reduce the bloating. If you had a lower endoscopy (such as a colonoscopy or flexible sigmoidoscopy) you may notice spotting of blood in your stool or on the toilet paper. If you underwent a bowel prep for your procedure, you may not have a normal bowel movement for a few days.  Please Note:  You might notice some irritation and congestion in your nose or some drainage.  This is from the oxygen used during your procedure.  There is no need for concern and it should clear up in a day or so.  SYMPTOMS TO REPORT IMMEDIATELY:   Following lower endoscopy (colonoscopy or flexible sigmoidoscopy):  Excessive amounts of blood in the stool  Significant tenderness or worsening of abdominal pains  Swelling of the abdomen that is new, acute  Fever of 100F or higher   For urgent or emergent issues, a gastroenterologist can be reached at any hour by calling [phone].   DIET:  We do recommend a small meal at first, but then you may proceed to your regular diet.  Drink plenty of fluids but you should avoid alcoholic beverages for 24 hours.  ACTIVITY:  You  should plan to take it easy for the rest of today and you should NOT DRIVE or use heavy machinery until tomorrow (because of the sedation medicines used during the test).    FOLLOW UP: Our staff will call the number listed on your records the next business day following your procedure to check on you and address any questions or concerns that you may have regarding the information given to you following your procedure. If we do not reach you, we will leave a message.  However, if you are feeling well and you are not experiencing any problems, there is no need to return our call.  We will assume that you have returned to your regular daily activities without incident.  If any biopsies were taken you will be contacted by phone or by letter within the next 1-3 weeks.  Please call us at [phone] if you have not heard about the biopsies in 3 weeks.    SIGNATURES/CONFIDENTIALITY: You and/or your care partner have signed paperwork which will be entered into your electronic medical record.  These signatures attest to the fact that that the information above on your After Visit Summary has been reviewed and is understood.  Full responsibility of the confidentiality of this discharge information lies with you and/or your care-partner.

## 2017-11-20 NOTE — Op Note (Signed)
[facility] Patient Name: [name] Walker Procedure Date: 11/20/2017 3:45 PM MRN: [id number] Endoscopist: Ladene Artist , MD Age: 57 Referring MD:  Date of Birth: 07-Nov-1960 Gender: Female Account #: [id number] Procedure:                Colonoscopy Indications:              Screening for colorectal malignant neoplasm Medicines:                Monitored Anesthesia Care Procedure:                Pre-Anesthesia Assessment:                           - Prior to the procedure, a History and Physical                            was performed, and patient medications and                            allergies were reviewed. The patient's tolerance of                            previous anesthesia was also reviewed. The risks                            and benefits of the procedure and the sedation                            options and risks were discussed with the patient.                            All questions were answered, and informed consent                            was obtained. Prior Anticoagulants: The patient has                            taken no previous anticoagulant or antiplatelet                            agents. ASA Grade Assessment: II - A patient with                            mild systemic disease. After reviewing the risks                            and benefits, the patient was deemed in                            satisfactory condition to undergo the procedure.                           After obtaining informed consent, the colonoscope  was passed under direct vision. Throughout the                            procedure, the patient's blood pressure, pulse, and                            oxygen saturations were monitored continuously. The                            Colonoscope was introduced through the anus and                            advanced to the the cecum, identified by                            appendiceal orifice and  ileocecal valve. The                            ileocecal valve, appendiceal orifice, and rectum                            were photographed. The quality of the bowel                            preparation was good. The colonoscopy was performed                            without difficulty. The patient tolerated the                            procedure well. Scope In: 3:57:37 PM Scope Out: 4:16:16 PM Scope Withdrawal Time: 0 hours 16 minutes 24 seconds  Total Procedure Duration: 0 hours 18 minutes 39 seconds  Findings:                 The perianal and digital rectal examinations were                            normal.                           Six sessile polyps were found in the sigmoid colon                            (4), descending colon (1) and transverse colon (1).                            The polyps were 7 to 8 mm in size. These polyps                            were removed with a cold snare. Resection and                            retrieval were complete.  Two sessile polyps were found in the descending                            colon and ascending colon. The polyps were 4 to 5                            mm in size. These polyps were removed with a cold                            biopsy forceps. Resection and retrieval were                            complete.                           Multiple medium-mouthed diverticula were found in                            the sigmoid colon, descending colon and transverse                            colon.                           The exam was otherwise without abnormality on                            direct and retroflexion views. Complications:            No immediate complications. Estimated blood loss:                            None. Estimated Blood Loss:     Estimated blood loss: none. Impression:               - Six 7 to 8 mm polyps in the sigmoid colon, in the                            descending  colon and in the transverse colon,                            removed with a cold snare. Resected and retrieved.                           - Two 4 to 5 mm polyps in the descending colon and                            in the ascending colon, removed with a cold biopsy                            forceps. Resected and retrieved.                           - Diverticulosis in the sigmoid colon, in the  descending colon and in the transverse colon.                           - The examination was otherwise normal on direct                            and retroflexion views. Recommendation:           - Repeat colonoscopy date to be determined after                            pending pathology results are reviewed for                            surveillance.                           - Patient has a contact number available for                            emergencies. The signs and symptoms of potential                            delayed complications were discussed with the                            patient. Return to normal activities tomorrow.                            Written discharge instructions were provided to the                            patient.                           - High fiber diet.                           - Continue present medications.                           - Await pathology results.                           - Protonix (pantoprazole) 40 mg PO BID, 1 year of                            refills.                           - Antireflux measures                           - Return to GI office in 6 weeks. Ladene Artist, MD 11/20/2017 4:22:01 PM This report has been signed electronically.

## 2017-11-20 NOTE — Progress Notes (Signed)
To PACU, VSS. Report to [email or web address] 

## 2017-11-21 ENCOUNTER — Telehealth: Payer: Self-pay

## 2017-11-21 NOTE — Telephone Encounter (Signed)
  Follow up Call-  Call Remonia Otte number 11/20/2017  Post procedure Call Andreka Stucki phone  # [phone]  Permission to leave phone message Yes  Some recent data might be hidden     Patient questions:  Do you have a fever, pain , or abdominal swelling? No. Pain Score  0 *  Have you tolerated food without any problems? Yes.    Have you been able to return to your normal activities? Yes.    Do you have any questions about your discharge instructions: Diet   No. Medications  No. Follow up visit  No.  Do you have questions or concerns about your Care? No.  Actions: * If pain score is 4 or above: No action needed, pain <4.

## 2017-12-07 ENCOUNTER — Encounter: Payer: Self-pay | Admitting: Gastroenterology

## 2017-12-20 ENCOUNTER — Ambulatory Visit (HOSPITAL_COMMUNITY)
Admission: EM | Admit: 2017-12-20 | Discharge: 2017-12-20 | Disposition: A | Discharge status: Home / Self Care | Payer: BC Managed Care – PPO | Attending: Family Medicine | Admitting: Family Medicine

## 2017-12-20 ENCOUNTER — Encounter (HOSPITAL_COMMUNITY): Payer: Self-pay | Admitting: Emergency Medicine

## 2017-12-20 DIAGNOSIS — R110 Nausea: Secondary | ICD-10-CM | POA: Diagnosis not present

## 2017-12-20 DIAGNOSIS — K219 Gastro-esophageal reflux disease without esophagitis: Secondary | ICD-10-CM | POA: Diagnosis not present

## 2017-12-20 DIAGNOSIS — Z87891 Personal history of nicotine dependence: Secondary | ICD-10-CM | POA: Insufficient documentation

## 2017-12-20 DIAGNOSIS — N189 Chronic kidney disease, unspecified: Secondary | ICD-10-CM | POA: Diagnosis not present

## 2017-12-20 DIAGNOSIS — Z87442 Personal history of urinary calculi: Secondary | ICD-10-CM | POA: Insufficient documentation

## 2017-12-20 DIAGNOSIS — R319 Hematuria, unspecified: Secondary | ICD-10-CM | POA: Diagnosis present

## 2017-12-20 DIAGNOSIS — Z791 Long term (current) use of non-steroidal anti-inflammatories (NSAID): Secondary | ICD-10-CM | POA: Insufficient documentation

## 2017-12-20 DIAGNOSIS — Z79899 Other long term (current) drug therapy: Secondary | ICD-10-CM | POA: Insufficient documentation

## 2017-12-20 DIAGNOSIS — I129 Hypertensive chronic kidney disease with stage 1 through stage 4 chronic kidney disease, or unspecified chronic kidney disease: Secondary | ICD-10-CM | POA: Insufficient documentation

## 2017-12-20 DIAGNOSIS — M545 Low back pain, unspecified: Secondary | ICD-10-CM

## 2017-12-20 DIAGNOSIS — N3281 Overactive bladder: Secondary | ICD-10-CM | POA: Insufficient documentation

## 2017-12-20 DIAGNOSIS — R310 Gross hematuria: Secondary | ICD-10-CM | POA: Insufficient documentation

## 2017-12-20 DIAGNOSIS — F419 Anxiety disorder, unspecified: Secondary | ICD-10-CM | POA: Diagnosis not present

## 2017-12-20 DIAGNOSIS — Z888 Allergy status to other drugs, medicaments and biological substances status: Secondary | ICD-10-CM | POA: Insufficient documentation

## 2017-12-20 LAB — URINALYSIS, ROUTINE W REFLEX MICROSCOPIC

## 2017-12-20 LAB — URINALYSIS, MICROSCOPIC (REFLEX): RBC / HPF: >50 RBC/hpf (ref 0–5)

## 2017-12-20 MED ORDER — TAMSULOSIN HCL 0.4 MG PO CAPS: 0.4000 mg | ORAL_CAPSULE | Freq: Every day | ORAL | 0 refills | Status: DC

## 2017-12-20 MED ORDER — NITROFURANTOIN MONOHYD MACRO 100 MG PO CAPS: 100.0000 mg | ORAL_CAPSULE | Freq: Two times a day (BID) | ORAL | 0 refills | Status: DC

## 2017-12-20 NOTE — ED Provider Notes (Signed)
[place]    CSN: [id number] Arrival date & time: 12/20/17  1624     History   Chief Complaint Chief Complaint  Patient presents with  . Back Pain    HPI [name] Walker is a 57 y.o. female history of kidney stones, CKD, anxiety, asthma, presenting today for evaluation of low back pain and hematuria.  Patient states that earlier today she started to develop right-sided lower back pain and a sharp pain in her right lower side.  When she went to the restroom at work she noticed she had a significant amount of blood in her urine prompting her visit today.  She is concerned about UTI.  She has noticed increase in frequency, but denies dysuria.  She has a history of both UTIs and kidney stones.  Her last kidney stone was approximately 1 year ago and states that she required lithotripsy as it was the size of a golf ball.  States that she typically gets a kidney stone every 2 years.  The pain has come and gone.  Denies fever, vomiting.  Has had some mild nausea.  Denies suprapubic pressure.  HPI  Past Medical History:  Diagnosis Date  . ACHILLES TENDINITIS   . ALLERGIC RHINITIS   . Allergy   . Anginal pain ([place]) 04/04/2016   Pt currently feels like she is having muscle spasms and aching in her chest  . Anxiety   . Asthma   . Chronic kidney disease   . GERD   . History of kidney stones   . HYPERTENSION   . LATERAL EPICONDYLITIS, RIGHT   . LOW BACK PAIN   . Plantar fascial fibromatosis   . SUBACROMIAL BURSITIS, RIGHT   . Thoracic scoliosis 10/01/2015    Patient Active Problem List   Diagnosis Date Noted  . Former smoker 04/29/2016  . Bilateral hand pain 04/29/2016  . HNP (herniated nucleus pulposus), cervical 04/07/2016  . Cervical radiculopathy, acute 01/29/2016  . Acute upper respiratory infection 01/29/2016  . Degenerative disc disease, cervical 01/11/2016  . Hyperglycemia 11/17/2015  . Angioedema 10/12/2015  . Bilateral lower extremity edema 10/12/2015  .  Mouth sores 10/12/2015  . Hypokalemia 10/12/2015  . Thoracic scoliosis 10/01/2015  . Peripheral edema 10/01/2015  . Eczema 10/01/2015  . Asthma 10/01/2015  . Breast lump on right side at 4 o'clock position 01/21/2014  . Hot flashes 10/01/2013  . Grief reaction 07/17/2012  . Family history of colon cancer 07/02/2012  . OAB (overactive bladder) 03/01/2012  . Preventative health care 08/23/2010  . Plantar fasciitis 08/23/2010  . UTI (urinary tract infection) 10/19/2008  . ANKLE PAIN, BILATERAL 10/19/2008  . VAGINITIS 09/10/2008  . JOINT EFFUSION, ANKLE 09/10/2008  . SUBACROMIAL BURSITIS, RIGHT 09/01/2008  . ACHILLES TENDINITIS 09/01/2008  . LATERAL EPICONDYLITIS, RIGHT 08/18/2008  . Essential hypertension 07/12/2007  . Allergic rhinitis 07/12/2007  . GERD 07/12/2007  . Chronic low back pain 07/12/2007  . NEPHROLITHIASIS, HX OF 07/12/2007    Past Surgical History:  Procedure Laterality Date  . ANTERIOR CERVICAL DECOMP/DISCECTOMY FUSION N/A 04/07/2016   Procedure: Cervical two-three Anterior cervical decompression/discectomy/fusion;  Surgeon: Ashok Pall, MD;  Location: [facility];  Service: Neurosurgery;  Laterality: N/A;  . BACK SURGERY  2015   lower back  . EXTRACORPOREAL SHOCK WAVE LITHOTRIPSY Left 09/28/2016   Procedure: LEFT EXTRACORPOREAL SHOCK WAVE LITHOTRIPSY (ESWL);  Surgeon: Festus Aloe, MD;  Location: [facility];  Service: Urology;  Laterality: Left;  . KIDNEY SURGERY    . LITHOTRIPSY  05/2016  . TUBAL LIGATION      OB History    Gravida  3   Para  3   Term      Preterm      AB      Living  3     SAB      TAB      Ectopic      Multiple      Live Births               Home Medications    Prior to Admission medications   Medication Sig Start Date End Date Taking? Authorizing Provider  albuterol (PROVENTIL HFA;VENTOLIN HFA) 108 (90 Base) MCG/ACT inhaler Inhale 2 puffs into the lungs every 6 (six) hours as needed for wheezing or shortness of  breath. 10/01/15   Biagio Borg, MD  amLODipine (NORVASC) 5 MG tablet Take 1 tablet (5 mg total) by mouth daily. 09/06/17   Biagio Borg, MD  cetirizine (ZYRTEC) 10 MG tablet Take 1 tablet (10 mg total) by mouth daily. 09/06/17 09/06/18  Biagio Borg, MD  fluticasone (FLONASE) 50 MCG/ACT nasal spray Place 1 spray into both nostrils daily. 06/10/17   Khatri, Hina, PA-C  hydrochlorothiazide (HYDRODIURIL) 25 MG tablet Take 1 tablet (25 mg total) by mouth daily. 09/06/17   Biagio Borg, MD  ibuprofen (ADVIL,MOTRIN) 800 MG tablet TAKE 1 TABLET BY MOUTH EVERY 8 HOURS AS NEEDED FOR MODERATE PAIN Patient not taking: Reported on 11/20/2017 09/06/17   Biagio Borg, MD  nitrofurantoin, macrocrystal-monohydrate, (MACROBID) 100 MG capsule Take 1 capsule (100 mg total) by mouth 2 (two) times daily for 5 days. 12/20/17 12/25/17  Echo Propp C, PA-C  pantoprazole (PROTONIX) 40 MG tablet Take 1 tablet (40 mg total) by mouth 2 (two) times daily. 11/20/17   Ladene Artist, MD  sodium chloride (OCEAN) 0.65 % SOLN nasal spray Place 2 sprays into both nostrils daily as needed for congestion.     [provider]  tamsulosin (FLOMAX) 0.4 MG CAPS capsule Take 1 capsule (0.4 mg total) by mouth daily. 12/20/17   Fredi Geiler C, PA-C  triamcinolone (NASACORT) 55 MCG/ACT AERO nasal inhaler Place 2 sprays into the nose daily. Patient not taking: Reported on 11/20/2017 09/06/17   Biagio Borg, MD    Family History Family History  Problem Relation Age of Onset  . Cancer Sister        colon cancer  . Colon cancer Sister   . Diabetes Mother   . Cancer Father        Prostate  . Colon cancer Father   . Breast cancer Maternal Aunt        50's  . Esophageal cancer Neg Hx   . Stomach cancer Neg Hx     Social History Social History   Tobacco Use  . Smoking status: Former Smoker    Packs/day: 0.50    Years: 20.00    Pack years: 10.00    Types: Cigarettes    Last attempt to quit: 02/28/2016    Years since  quitting: 1.8  . Smokeless tobacco: Never Used  Substance Use Topics  . Alcohol use: No  . Drug use: No     Allergies   Flexeril [cyclobenzaprine]; Lisinopril; Other; and Robaxin [methocarbamol]   Review of Systems Review of Systems  Constitutional: Negative for fever.  Respiratory: Negative for shortness of breath.   Cardiovascular: Negative for chest pain.  Gastrointestinal: Negative for abdominal pain,  diarrhea, nausea and vomiting.  Genitourinary: Positive for flank pain, frequency and hematuria. Negative for dysuria, genital sores, menstrual problem, vaginal bleeding, vaginal discharge and vaginal pain.  Musculoskeletal: Positive for back pain.  Skin: Negative for rash.  Neurological: Negative for dizziness, light-headedness and headaches.     Physical Exam Triage Vital Signs ED Triage Vitals  Enc Vitals Group     BP 12/20/17 1637 116/87     Pulse Rate 12/20/17 1637 81     Resp 12/20/17 1637 18     Temp 12/20/17 1637 98.2 F (36.8 C)     Temp Source 12/20/17 1637 Oral     SpO2 12/20/17 1637 100 %     Weight --      Height --      Head Circumference --      Peak Flow --      Pain Score 12/20/17 1639 10     Pain Loc --      Pain Edu? --      Excl. in [place]? --    No data found.  Updated Vital Signs BP 116/87 (BP Location: Left Arm)   Pulse 81   Temp 98.2 F (36.8 C) (Oral)   Resp 18   LMP 08/22/2010   SpO2 100%   Visual Acuity Right Eye Distance:   Left Eye Distance:   Bilateral Distance:    Right Eye Near:   Left Eye Near:    Bilateral Near:     Physical Exam  Constitutional: She appears well-developed and well-nourished. No distress.  No acute distress, sitting comfortably on exam table  HENT:  Head: Normocephalic and atraumatic.  Eyes: Conjunctivae are normal.  Neck: Neck supple.  Cardiovascular: Normal rate and regular rhythm.  No murmur heard. Pulmonary/Chest: Effort normal and breath sounds normal. No respiratory distress.  Abdominal:  Soft. There is no tenderness.  Old well-healed scar through central abdomen midline extending above the umbilicus, nontender to palpation throughout all 4 quadrants and epigastrium of abdomen, nontender to suprapubic area No CVA tenderness  Musculoskeletal: She exhibits no edema.  Nontender to palpation of cervical, thoracic and lumbar spine midline, nontender to palpation of lateral and paraspinal lumbar and thoracic musculature  Neurological: She is alert.  Skin: Skin is warm and dry.  Psychiatric: She has a normal mood and affect.  Nursing note and vitals reviewed.    UC Treatments / Results  Labs (all labs ordered are listed, but only abnormal results are displayed) Labs Reviewed  URINE CULTURE  URINALYSIS, ROUTINE W REFLEX MICROSCOPIC   Point-of-care UA unable to run due to color of urine, urine is extremely dark with significant amount of blood, manual UA does appear alteration in leuks and nitrites, unclear if this is a true positive versus false positive from color alteration.   EKG None  Radiology No results found.  Procedures Procedures (including critical care time)  Medications Ordered in UC Medications - No data to display  Initial Impression / Assessment and Plan / UC Course  I have reviewed the triage vital signs and the nursing notes.  Pertinent labs & imaging results that were available during my care of the patient were reviewed by me and considered in my medical decision making (see chart for details).    Will send urine for urine microscopy as well as culture given unable to perform point-of-care UA in clinic.  Patient has history of both UTI and kidney stones.  Will begin patient on Macrobid twice daily for 5 days  as well as Flomax.  Will call patient with results of urine microscopy/culture to further delineate symptoms.  Advised if culture comes back negative will have patient follow-up with urology or in emergency room if symptoms worsening or  persisting.  Tylenol and anti-inflammatories for pain.Discussed strict return precautions. Patient verbalized understanding and is agreeable with plan.   Final Clinical Impressions(s) / UC Diagnoses   Final diagnoses:  Acute right-sided low back pain without sciatica  Gross hematuria     Discharge Instructions     Your urine was too dark for [place] to check for infection today Given your symptoms I am going to go ahead and treat you for both kidney stone and UTI Please begin Macrobid twice daily for the next 5 days Please begin taking Flomax daily as well as taking anti-inflammatories to help with back pain, please use Tylenol, [id number] mg every 6 hours, ibuprofen 600-800 every 8 hours with food  We are sending her urine off to check for infection, we will call you to inform you of the results If symptoms still persisting despite treatment for UTI, please follow-up with urology or emergency room if symptoms not improving, not passing a stone, developing fever   ED Prescriptions    Medication Sig Dispense Auth. Provider   nitrofurantoin, macrocrystal-monohydrate, (MACROBID) 100 MG capsule Take 1 capsule (100 mg total) by mouth 2 (two) times daily for 5 days. 10 capsule Wileen Duncanson C, PA-C   tamsulosin (FLOMAX) 0.4 MG CAPS capsule Take 1 capsule (0.4 mg total) by mouth daily. 10 capsule Chesnee Floren C, PA-C     Controlled Substance Prescriptions [place] Controlled Substance Registry consulted? Not Applicable   Janith Lima, [place] 12/20/17 1718

## 2017-12-20 NOTE — ED Triage Notes (Signed)
Pt concerned for UTI, c/o back pain.

## 2017-12-20 NOTE — Discharge Instructions (Signed)
Your urine was too dark for [place] to check for infection today Given your symptoms I am going to go ahead and treat you for both kidney stone and UTI Please begin Macrobid twice daily for the next 5 days Please begin taking Flomax daily as well as taking anti-inflammatories to help with back pain, please use Tylenol, [phone] mg every 6 hours, ibuprofen 600-800 every 8 hours with food  We are sending her urine off to check for infection, we will call you to inform you of the results If symptoms still persisting despite treatment for UTI, please follow-up with urology or emergency room if symptoms not improving, not passing a stone, developing fever

## 2017-12-21 LAB — URINE CULTURE

## 2017-12-24 ENCOUNTER — Other Ambulatory Visit: Payer: BC Managed Care – PPO

## 2017-12-24 ENCOUNTER — Ambulatory Visit: Payer: BC Managed Care – PPO | Admitting: Family

## 2017-12-24 ENCOUNTER — Encounter: Payer: Self-pay | Admitting: Family

## 2017-12-24 VITALS — BP 108/80 | HR 80 | Temp 97.7°F | Ht 62.0 in | Wt 153.1 lb

## 2017-12-24 DIAGNOSIS — R300 Dysuria: Secondary | ICD-10-CM

## 2017-12-24 DIAGNOSIS — R319 Hematuria, unspecified: Secondary | ICD-10-CM

## 2017-12-24 LAB — POC URINALSYSI DIPSTICK (AUTOMATED)
Bilirubin, UA: NEGATIVE
Glucose, UA: NEGATIVE
Ketones, UA: NEGATIVE
Leukocytes, UA: NEGATIVE
Nitrite, UA: NEGATIVE
Protein, UA: NEGATIVE
Spec Grav, UA: <=1.005 — AB (ref 1.010–1.025)
Urobilinogen, UA: 0.2 E.U./dL
pH, UA: 6.5 (ref 5.0–8.0)

## 2017-12-24 MED ORDER — CIPROFLOXACIN HCL 250 MG PO TABS: 250.0000 mg | ORAL_TABLET | Freq: Two times a day (BID) | ORAL | 0 refills | Status: AC

## 2017-12-24 NOTE — Progress Notes (Signed)
[name] Walker is a 57 y.o. female with the following history as recorded in Epic[employer]:  Patient Active Problem List   Diagnosis Date Noted  . Former smoker 04/29/2016  . Bilateral hand pain 04/29/2016  . HNP (herniated nucleus pulposus), cervical 04/07/2016  . Cervical radiculopathy, acute 01/29/2016  . Acute upper respiratory infection 01/29/2016  . Degenerative disc disease, cervical 01/11/2016  . Hyperglycemia 11/17/2015  . Angioedema 10/12/2015  . Bilateral lower extremity edema 10/12/2015  . Mouth sores 10/12/2015  . Hypokalemia 10/12/2015  . Thoracic scoliosis 10/01/2015  . Peripheral edema 10/01/2015  . Eczema 10/01/2015  . Asthma 10/01/2015  . Breast lump on right side at 4 o'clock position 01/21/2014  . Hot flashes 10/01/2013  . Grief reaction 07/17/2012  . Family history of colon cancer 07/02/2012  . OAB (overactive bladder) 03/01/2012  . Preventative health care 08/23/2010  . Plantar fasciitis 08/23/2010  . UTI (urinary tract infection) 10/19/2008  . ANKLE PAIN, BILATERAL 10/19/2008  . VAGINITIS 09/10/2008  . JOINT EFFUSION, ANKLE 09/10/2008  . SUBACROMIAL BURSITIS, RIGHT 09/01/2008  . ACHILLES TENDINITIS 09/01/2008  . LATERAL EPICONDYLITIS, RIGHT 08/18/2008  . Essential hypertension 07/12/2007  . Allergic rhinitis 07/12/2007  . GERD 07/12/2007  . Chronic low back pain 07/12/2007  . NEPHROLITHIASIS, HX OF 07/12/2007    Current Outpatient Medications  Medication Sig Dispense Refill  . albuterol (PROVENTIL HFA;VENTOLIN HFA) 108 (90 Base) MCG/ACT inhaler Inhale 2 puffs into the lungs every 6 (six) hours as needed for wheezing or shortness of breath. 1 Inhaler 11  . amLODipine (NORVASC) 5 MG tablet Take 1 tablet (5 mg total) by mouth daily. 90 tablet 3  . cetirizine (ZYRTEC) 10 MG tablet Take 1 tablet (10 mg total) by mouth daily. 30 tablet 11  . fluticasone (FLONASE) 50 MCG/ACT nasal spray Place 1 spray into both nostrils daily. 16 g 2  . hydrochlorothiazide  (HYDRODIURIL) 25 MG tablet Take 1 tablet (25 mg total) by mouth daily. 90 tablet 3  . pantoprazole (PROTONIX) 40 MG tablet Take 1 tablet (40 mg total) by mouth 2 (two) times daily. 60 tablet 11  . sodium chloride (OCEAN) 0.65 % SOLN nasal spray Place 2 sprays into both nostrils daily as needed for congestion.     . ciprofloxacin (CIPRO) 250 MG tablet Take 1 tablet (250 mg total) by mouth 2 (two) times daily for 5 days. 10 tablet 0   No current facility-administered medications for this visit.     Allergies: Flexeril [cyclobenzaprine]; Lisinopril; Other; and Robaxin [methocarbamol]  Past Medical History:  Diagnosis Date  . ACHILLES TENDINITIS   . ALLERGIC RHINITIS   . Allergy   . Anginal pain ([place]) 04/04/2016   Pt currently feels like she is having muscle spasms and aching in her chest  . Anxiety   . Asthma   . Chronic kidney disease   . GERD   . History of kidney stones   . HYPERTENSION   . LATERAL EPICONDYLITIS, RIGHT   . LOW BACK PAIN   . Plantar fascial fibromatosis   . SUBACROMIAL BURSITIS, RIGHT   . Thoracic scoliosis 10/01/2015    Past Surgical History:  Procedure Laterality Date  . ANTERIOR CERVICAL DECOMP/DISCECTOMY FUSION N/A 04/07/2016   Procedure: Cervical two-three Anterior cervical decompression/discectomy/fusion;  Surgeon: Ashok Pall, MD;  Location: [facility];  Service: Neurosurgery;  Laterality: N/A;  . BACK SURGERY  2015   lower back  . EXTRACORPOREAL SHOCK WAVE LITHOTRIPSY Left 09/28/2016   Procedure: LEFT EXTRACORPOREAL SHOCK WAVE LITHOTRIPSY (  ESWL);  Surgeon: Festus Aloe, MD;  Location[facility];  Service: Urology;  Laterality: Left;  . KIDNEY SURGERY    . LITHOTRIPSY  05/2016  . TUBAL LIGATION      Family History  Problem Relation Age of Onset  . Cancer Sister        colon cancer  . Colon cancer Sister   . Diabetes Mother   . Cancer Father        Prostate  . Colon cancer Father   . Breast cancer Maternal Aunt        50's  . Esophageal cancer Neg Hx    . Stomach cancer Neg Hx     Social History   Tobacco Use  . Smoking status: Former Smoker    Packs/day: 0.50    Years: 20.00    Pack years: 10.00    Types: Cigarettes    Last attempt to quit: 02/28/2016    Years since quitting: 1.8  . Smokeless tobacco: Never Used  Substance Use Topics  . Alcohol use: No    Subjective:  Patient went to the ER on 10/24 with concerns for UTI/ blood in the urine; notes that did not have any type of imaging done at the ER; had to stop the antibiotic with nausea/ feeling sick to her stomach; no fever, no vomiting; has not seen blood in her urine yesterday; has tolerated Macrobid in the past with no difficulty; new medication added this time was Flomax- does not think she has taken before.     Objective:  Vitals:   12/24/17 1119  BP: 108/80  Pulse: 80  Temp: 97.7 F (36.5 C)  TempSrc: Oral  SpO2: 99%  Weight: 153 lb 1.3 oz (69.4 kg)  Height: 5\' 2"  (1.575 m)    General: Well developed, well nourished, in no acute distress  Skin : Warm and dry.  Head: Normocephalic and atraumatic  Lungs: Respirations unlabored; clear to auscultation bilaterally without wheeze, rales, rhonchi  CVS exam: normal rate and regular rhythm.  Abdomen: Soft; nontender; nondistended; normoactive bowel sounds; no masses or hepatosplenomegaly  Neurologic: Alert and oriented; speech intact; face symmetrical; moves all extremities well; CNII-XII intact without focal deficit  Assessment:  1. Dysuria   2. Hematuria, unspecified type     Plan:  ? Persisting UTI; hold Flomax; update U/A and urine culture; Rx for Cipro 250 mg bid x 5 days; may need to consider renal stone CT; follow-up to be determined.   No follow-ups on file.  Orders Placed This Encounter  Procedures  . Urine Culture    Standing Status:   Future    Standing Expiration Date:   12/24/2018  . POCT Urinalysis Dipstick (Automated)    Requested Prescriptions   Signed Prescriptions Disp Refills  .  ciprofloxacin (CIPRO) 250 MG tablet 10 tablet 0    Sig: Take 1 tablet (250 mg total) by mouth 2 (two) times daily for 5 days.

## 2017-12-25 LAB — URINE CULTURE
MICRO NUMBER:: 91293332
Result:: NO GROWTH
SPECIMEN QUALITY:: ADEQUATE

## 2017-12-26 ENCOUNTER — Other Ambulatory Visit: Payer: Self-pay | Admitting: Family

## 2017-12-26 DIAGNOSIS — R319 Hematuria, unspecified: Secondary | ICD-10-CM

## 2018-03-27 ENCOUNTER — Ambulatory Visit: Payer: Self-pay | Admitting: *Deleted

## 2018-03-27 NOTE — Telephone Encounter (Signed)
Pt c/o sharp pain on the right lower abd. It comes and goes. Started on Friday.  No bloating, nauseated yesterday. Denies fever.  Pain is throbbing at times. Has been taking ibuprofen for the pain. Advised to try Extra Strength Tylenol, until seen in the office.  On Friday she said she noticed a tiny drop of blood in her stool and has not had any more since. No constipation or diarrhea. Appointment scheduled per pt request for after 4 pm. Advised to go to ED for increase in pain, nausea, vomiting, fever. Pt voiced understanding. Routing to flow at [facility].  Reason for Disposition . [1] MODERATE pain (e.g., interferes with normal activities) AND [2] pain comes and goes (cramps) AND [3] present > 24 hours  (Exception: pain with Vomiting or Diarrhea - see that Guideline)  Answer Assessment - Initial Assessment Questions 1. LOCATION: "Where does it hurt?"      Right sided pain 2. RADIATION: "Does the pain shoot anywhere else?" (e.g., chest, back)     no 3. ONSET: "When did the pain begin?" (e.g., minutes, hours or days ago)      Friday 4. SUDDEN: "Gradual or sudden onset?"     gradual 5. PATTERN "Does the pain come and go, or is it constant?"    - If constant: "Is it getting better, staying the same, or worsening?"      (Note: Constant means the pain never goes away completely; most serious pain is constant and it progresses)     - If intermittent: "How long does it last?" "Do you have pain now?"     (Note: Intermittent means the pain goes away completely between bouts)     constant 6. SEVERITY: "How bad is the pain?"  (e.g., Scale 1-10; mild, moderate, or severe)   - MILD (1-3): doesn't interfere with normal activities, abdomen soft and not tender to touch    - MODERATE (4-7): interferes with normal activities or awakens from sleep, tender to touch    - SEVERE (8-10): excruciating pain, doubled over, unable to do any normal activities      Pain # 8 7. RECURRENT SYMPTOM: "Have  you ever had this type of abdominal pain before?" If so, ask: "When was the last time?" and "What happened that time?"      no 8. CAUSE: "What do you think is causing the abdominal pain?"     no 9. RELIEVING/AGGRAVATING FACTORS: "What makes it better or worse?" (e.g., movement, antacids, bowel movement)     Movement makes it worst 10. OTHER SYMPTOMS: "Has there been any vomiting, diarrhea, constipation, or urine problems?"       Nauseated yesterday 11. PREGNANCY: "Is there any chance you are pregnant?" "When was your last menstrual period?"       No periods  Protocols used: ABDOMINAL PAIN - [facility]

## 2018-03-27 NOTE — Telephone Encounter (Signed)
Noted  

## 2018-03-28 ENCOUNTER — Other Ambulatory Visit (INDEPENDENT_AMBULATORY_CARE_PROVIDER_SITE_OTHER): Payer: BC Managed Care – PPO

## 2018-03-28 ENCOUNTER — Ambulatory Visit: Payer: BC Managed Care – PPO | Admitting: Family Medicine

## 2018-03-28 ENCOUNTER — Encounter: Payer: Self-pay | Admitting: Family Medicine

## 2018-03-28 VITALS — BP 142/90 | HR 99 | Temp 97.7°F | Ht 62.0 in | Wt 158.6 lb

## 2018-03-28 DIAGNOSIS — R1031 Right lower quadrant pain: Secondary | ICD-10-CM | POA: Diagnosis not present

## 2018-03-28 DIAGNOSIS — R350 Frequency of micturition: Secondary | ICD-10-CM | POA: Diagnosis not present

## 2018-03-28 DIAGNOSIS — Z87442 Personal history of urinary calculi: Secondary | ICD-10-CM

## 2018-03-28 LAB — POC URINALSYSI DIPSTICK (AUTOMATED)
Bilirubin, UA: NEGATIVE
Blood, UA: POSITIVE
Glucose, UA: NEGATIVE
Ketones, UA: NEGATIVE
Leukocytes, UA: NEGATIVE
Nitrite, UA: NEGATIVE
Protein, UA: NEGATIVE
Spec Grav, UA: 1.015 (ref 1.010–1.025)
Urobilinogen, UA: 0.2 E.U./dL
pH, UA: 7 (ref 5.0–8.0)

## 2018-03-28 LAB — CBC WITH DIFFERENTIAL/PLATELET
Basophils Absolute: 0.1 10*3/uL (ref 0.0–0.1)
Basophils Relative: 1.5 % (ref 0.0–3.0)
Eosinophils Absolute: 0.1 10*3/uL (ref 0.0–0.7)
Eosinophils Relative: 2.1 % (ref 0.0–5.0)
HCT: 43.2 % (ref 36.0–46.0)
Hemoglobin: 14.3 g/dL (ref 12.0–15.0)
Lymphocytes Relative: 45.5 % (ref 12.0–46.0)
Lymphs Abs: 2.5 10*3/uL (ref 0.7–4.0)
MCHC: 33 g/dL (ref 30.0–36.0)
MCV: 87.4 fl (ref 78.0–100.0)
Monocytes Absolute: 0.5 10*3/uL (ref 0.1–1.0)
Monocytes Relative: 9.6 % (ref 3.0–12.0)
Neutro Abs: 2.2 10*3/uL (ref 1.4–7.7)
Neutrophils Relative %: 41.3 % — ABNORMAL LOW (ref 43.0–77.0)
Platelets: 152 10*3/uL (ref 150.0–400.0)
RBC: 4.94 Mil/uL (ref 3.87–5.11)
RDW: 14.6 % (ref 11.5–15.5)
WBC: 5.4 10*3/uL (ref 4.0–10.5)

## 2018-03-28 LAB — COMPREHENSIVE METABOLIC PANEL
ALT: 27 U/L (ref 0–35)
AST: 19 U/L (ref 0–37)
Albumin: 4.2 g/dL (ref 3.5–5.2)
Alkaline Phosphatase: 130 U/L — ABNORMAL HIGH (ref 39–117)
BUN: 12 mg/dL (ref 6–23)
CO2: 28 mEq/L (ref 19–32)
Calcium: 9.6 mg/dL (ref 8.4–10.5)
Chloride: 108 mEq/L (ref 96–112)
Creatinine, Ser: 0.62 mg/dL (ref 0.40–1.20)
GFR: 119.53 mL/min (ref >60.00)
Glucose, Bld: 100 mg/dL — ABNORMAL HIGH (ref 70–99)
Potassium: 3.3 mEq/L — ABNORMAL LOW (ref 3.5–5.1)
Sodium: 142 mEq/L (ref 135–145)
Total Bilirubin: 0.3 mg/dL (ref 0.2–1.2)
Total Protein: 6.9 g/dL (ref 6.0–8.3)

## 2018-03-28 NOTE — Progress Notes (Signed)
Subjective:    Patient ID: [name] Walker, female    DOB: [birth year], 59 y.o.   MRN: [id number]  HPI  [name] Walker is a 58 year old female who presents today with right sided abdominal pain that has been present x 6 days. Abdominal Pain: Intermittent Location: Lower right side and hurts in the morning  Onset/Timing: 6 days ago and occurs intermittently Duration: Pain can last up to an hour but does not alwasy and then improves Severity/Quality: Pain can range between 6 and 8 when it occurs. Worse/Better by: Increased walking may aggravate pain, acetaminophen provides benefit Associated Symptoms: Drop of blood in urine 6 days ago but no blood in urine since that time. Urinary frequency is also present and she states that this has been present x 1 month. Associated mild nausea without vomiting occurred but is not present today Denies pain with urination, flank pain, fever, chills, and sweats Treatment with Ibuprofen and Acetaminophen have provided   History of kidney stones and lithotripsy present. She is followed by urology and stated that last kidney stone was in 01/2018.   Gyn care is UTD and last Pap was normal. Denies vaginal discharge today  Loss of appetite: No Vomiting: No Diarrhea: No Rectal bleeding: No Fever: No Weight loss: No Bloating: No Dysuria: No Blood in urine: Drop of blood that is no longer present Pregnancy: No, menopausal  Review of Systems  Constitutional: Negative for appetite change and chills.  Respiratory: Negative for cough, shortness of breath and wheezing.   Cardiovascular: Negative for chest pain and palpitations.  Gastrointestinal: Positive for nausea. Negative for vomiting.  Genitourinary: Positive for frequency and hematuria. Negative for vaginal bleeding, vaginal discharge and vaginal pain.  Skin: Negative for rash.  Neurological: Negative for dizziness, weakness, light-headedness and headaches.   Past Medical History:  Diagnosis Date  .  ACHILLES TENDINITIS   . ALLERGIC RHINITIS   . Allergy   . Anginal pain ([place]) 04/04/2016   Pt currently feels like she is having muscle spasms and aching in her chest  . Anxiety   . Asthma   . Chronic kidney disease   . GERD   . History of kidney stones   . HYPERTENSION   . LATERAL EPICONDYLITIS, RIGHT   . LOW BACK PAIN   . Plantar fascial fibromatosis   . SUBACROMIAL BURSITIS, RIGHT   . Thoracic scoliosis 10/01/2015     Social History   Socioeconomic History  . Marital status: Legally Separated    Spouse name: Not on file  . Number of children: 3  . Years of education: Not on file  . Highest education level: Not on file  Occupational History  . Occupation: Ship broker  school bus driver  Social Needs  . Financial resource strain: Not on file  . Food insecurity:    Worry: Not on file    Inability: Not on file  . Transportation needs:    Medical: Not on file    Non-medical: Not on file  Tobacco Use  . Smoking status: Former Smoker    Packs/day: 0.50    Years: 20.00    Pack years: 10.00    Types: Cigarettes    Last attempt to quit: 02/28/2016    Years since quitting: 2.0  . Smokeless tobacco: Never Used  Substance and Sexual Activity  . Alcohol use: No  . Drug use: No  . Sexual activity: Not Currently    Birth control/protection: Post-menopausal, Surgical    Comment: 1st  intercourse 58 yo-Fewer than 5 partners-BTL  Lifestyle  . Physical activity:    Days per week: Not on file    Minutes per session: Not on file  . Stress: Not on file  Relationships  . Social connections:    Talks on phone: Not on file    Gets together: Not on file    Attends religious service: Not on file    Active member of club or organization: Not on file    Attends meetings of clubs or organizations: Not on file    Relationship status: Not on file  . Intimate partner violence:    Fear of current or ex partner: Not on file    Emotionally abused: Not on file    Physically abused: Not on  file    Forced sexual activity: Not on file  Other Topics Concern  . Not on file  Social History Narrative  . Not on file    Past Surgical History:  Procedure Laterality Date  . ANTERIOR CERVICAL DECOMP/DISCECTOMY FUSION N/A 04/07/2016   Procedure: Cervical two-three Anterior cervical decompression/discectomy/fusion;  Surgeon: Ashok Pall, MD;  Location: [facility];  Service: Neurosurgery;  Laterality: N/A;  . BACK SURGERY  2015   lower back  . EXTRACORPOREAL SHOCK WAVE LITHOTRIPSY Left 09/28/2016   Procedure: LEFT EXTRACORPOREAL SHOCK WAVE LITHOTRIPSY (ESWL);  Surgeon: Festus Aloe, MD;  Location: [facility];  Service: Urology;  Laterality: Left;  . KIDNEY SURGERY    . LITHOTRIPSY  05/2016  . TUBAL LIGATION      Family History  Problem Relation Age of Onset  . Cancer Sister        colon cancer  . Colon cancer Sister   . Diabetes Mother   . Cancer Father        Prostate  . Colon cancer Father   . Breast cancer Maternal Aunt        50's  . Esophageal cancer Neg Hx   . Stomach cancer Neg Hx     Allergies  Allergen Reactions  . Flexeril [Cyclobenzaprine] Swelling    Tongue swells  . Lisinopril Other (See Comments)    ? Possible tongue swelling   . Other Palpitations    ALL MUSCLE RELAXERS-PER PATIENT  . Robaxin [Methocarbamol] Other (See Comments)    Tongue swelling    Current Outpatient Medications on File Prior to Visit  Medication Sig Dispense Refill  . albuterol (PROVENTIL HFA;VENTOLIN HFA) 108 (90 Base) MCG/ACT inhaler Inhale 2 puffs into the lungs every 6 (six) hours as needed for wheezing or shortness of breath. 1 Inhaler 11  . amLODipine (NORVASC) 5 MG tablet Take 1 tablet (5 mg total) by mouth daily. 90 tablet 3  . cetirizine (ZYRTEC) 10 MG tablet Take 1 tablet (10 mg total) by mouth daily. 30 tablet 11  . fluticasone (FLONASE) 50 MCG/ACT nasal spray Place 1 spray into both nostrils daily. 16 g 2  . hydrochlorothiazide (HYDRODIURIL) 25 MG tablet Take 1 tablet (25  mg total) by mouth daily. 90 tablet 3  . ibuprofen (ADVIL,MOTRIN) 800 MG tablet TAKE 1 TABLET BY MOUTH EVERY 8 HOURS AS NEEDED FOR MODERATE PAIN    . pantoprazole (PROTONIX) 40 MG tablet Take 1 tablet (40 mg total) by mouth 2 (two) times daily. 60 tablet 11  . sodium chloride (OCEAN) 0.65 % SOLN nasal spray Place 2 sprays into both nostrils daily as needed for congestion.     Marland Kitchen HYDROcodone-acetaminophen (NORCO/VICODIN) 5-325 MG tablet  No current facility-administered medications on file prior to visit.     BP (!) 142/90 (BP Location: Left Arm, Patient Position: Sitting, Cuff Size: Normal)   Pulse (!) 103   Temp 97.7 F (36.5 C) (Oral)   Ht 5\' 2"  (1.575 m)   Wt 158 lb 9.6 oz (71.9 kg)   LMP 08/22/2010   SpO2 98%   BMI 29.01 kg/m   Retake of HR:  99      Objective:   Physical Exam Constitutional:      Appearance: She is well-developed.  HENT:     Mouth/Throat:     Mouth: Mucous membranes are moist.     Pharynx: Oropharynx is clear.  Eyes:     General: No scleral icterus.    Pupils: Pupils are equal, round, and reactive to light.  Cardiovascular:     Rate and Rhythm: Normal rate and regular rhythm.     Heart sounds: Normal heart sounds.  Pulmonary:     Effort: Pulmonary effort is normal.     Breath sounds: Normal breath sounds.  Abdominal:     General: Bowel sounds are normal. There is no distension.     Palpations: Abdomen is soft.     Tenderness: There is no abdominal tenderness. There is no right CVA tenderness, left CVA tenderness, guarding or rebound. Negative signs include Murphy's sign and McBurney's sign.  Skin:    General: Skin is warm and dry.  Neurological:     Mental Status: She is alert.  Psychiatric:        Mood and Affect: Mood normal.        Behavior: Behavior normal.       Assessment & Plan:  1. Right lower quadrant abdominal pain Pain has improved and exam is reassuring today. No periumbilical pain or pain that started as periumbilical and is  now in right lower quadrant. Acetaminophen has provided benefit. Etiology unclear and she has experienced this previously. Low suspicion for acute abdominal findings as no tenderness appreciated today. Will check lab work and further treatment will be determined. Advised remaining hydrated and she will continue acetaminophen if needed for discomfort. Close return precautions advised. Further advised her to seek care immediately if pain worsens or she develops new symptoms.  - CBC with Differential/Platelet; Future - Comprehensive metabolic panel; Future  2. Urinary frequency UA negative for Leukocytes and nitrites today. + Blood in urine which has been noted previously. Advised her to follow up with urology to further evaluate due to her history of kidney stones and prior blood noted in her urine. We also discussed if  symptoms such as flank pain, blood in urine, and pain develop may likely need to have renal CT. She will call and schedule an appointment. - Urine Culture; Future - POCT Urinalysis Dipstick (Automated)  3. History of kidney stones Follow up with urology. She voiced understanding and agreed with plan.  Further recommendations will be determined after review of lab work.  Delano Metz, FNP-C

## 2018-03-28 NOTE — Patient Instructions (Signed)
Please go to the lab before you leave today.  You can continue with acetaminophen (Tylenol) for discomfort.   If symptoms worsen, please seek evaluation.  Follow up with your urology provider.   Urinary Frequency, Adult Urinary frequency means urinating more often than usual. You may urinate every 1-2 hours even though you drink a normal amount of fluid and do not have a bladder infection or condition. Although you urinate more often than normal, the total amount of urine produced in a day is normal. With urinary frequency, you may have an urgent need to urinate often. The stress and anxiety of needing to find a bathroom quickly can make this urge worse. This condition may go away on its own or you may need treatment at home. Home treatment may include bladder training, exercises, taking medicines, or making changes to your diet. Follow these instructions at home: Bladder health   Keep a bladder diary if told by your health care provider. Keep track of: ? What you eat and drink. ? How often you urinate. ? How much you urinate.  Follow a bladder training program if told by your health care provider. This may include: ? Learning to delay going to the bathroom. ? Double urinating (voiding). This helps if you are not completely emptying your bladder. ? Scheduled voiding.  Do Kegel exercises as told by your health care provider. Kegel exercises strengthen the muscles that help control urination, which may help the condition. Eating and drinking  If told by your health care provider, make diet changes, such as: ? Avoiding caffeine. ? Drinking fewer fluids, especially alcohol. ? Not drinking in the evening. ? Avoiding foods or drinks that may irritate the bladder. These include coffee, tea, soda, artificial sweeteners, citrus, tomato-based foods, and chocolate. ? Eating foods that help prevent or ease constipation. Constipation can make this condition worse. Your health care provider may  recommend that you:  Drink enough fluid to keep your urine pale yellow.  Take over-the-counter or prescription medicines.  Eat foods that are high in fiber, such as beans, whole grains, and fresh fruits and vegetables.  Limit foods that are high in fat and processed sugars, such as fried or sweet foods. General instructions  Take over-the-counter and prescription medicines only as told by your health care provider.  Keep all follow-up visits as told by your health care provider. This is important. Contact a health care provider if:  You start urinating more often.  You feel pain or irritation when you urinate.  You notice blood in your urine.  Your urine looks cloudy.  You develop a fever.  You begin vomiting. Get help right away if:  You are unable to urinate. Summary  Urinary frequency means urinating more often than usual. With urinary frequency, you may urinate every 1-2 hours even though you drink a normal amount of fluid and do not have a bladder infection or other bladder condition.  Your health care provider may recommend that you keep a bladder diary, follow a bladder training program, or make dietary changes.  If told by your health care provider, do Kegel exercises to strengthen the muscles that help control urination.  Take over-the-counter and prescription medicines only as told by your health care provider.  Contact a health care provider if your symptoms do not improve or get worse. This information is not intended to replace advice given to you by your health care provider. Make sure you discuss any questions you have with your health care  provider. Document Released: 12/10/2008 Document Revised: 08/23/2017 Document Reviewed: 08/23/2017 Elsevier Interactive Patient Education  2019 [employer].  Abdominal Pain, Adult  Many things can cause belly (abdominal) pain. Most times, belly pain is not dangerous. Many cases of belly pain can be watched and  treated at home. Sometimes belly pain is serious, though. Your doctor will try to find the cause of your belly pain. Follow these instructions at home:  Take over-the-counter and prescription medicines only as told by your doctor. Do not take medicines that help you poop (laxatives) unless told to by your doctor.  Drink enough fluid to keep your pee (urine) clear or pale yellow.  Watch your belly pain for any changes.  Keep all follow-up visits as told by your doctor. This is important. Contact a doctor if:  Your belly pain changes or gets worse.  You are not hungry, or you lose weight without trying.  You are having trouble pooping (constipated) or have watery poop (diarrhea) for more than 2-3 days.  You have pain when you pee or poop.  Your belly pain wakes you up at night.  Your pain gets worse with meals, after eating, or with certain foods.  You are throwing up and cannot keep anything down.  You have a fever. Get help right away if:  Your pain does not go away as soon as your doctor says it should.  You cannot stop throwing up.  Your pain is only in areas of your belly, such as the right side or the left lower part of the belly.  You have bloody or black poop, or poop that looks like tar.  You have very bad pain, cramping, or bloating in your belly.  You have signs of not having enough fluid or water in your body (dehydration), such as: ? Dark pee, very little pee, or no pee. ? Cracked lips. ? Dry mouth. ? Sunken eyes. ? Sleepiness. ? Weakness. This information is not intended to replace advice given to you by your health care provider. Make sure you discuss any questions you have with your health care provider. Document Released: 08/02/2007 Document Revised: 09/03/2015 Document Reviewed: 07/28/2015 Elsevier Interactive Patient Education  2019 [employer].

## 2018-03-29 LAB — URINE CULTURE
MICRO NUMBER:: 128088
Result:: NO GROWTH
SPECIMEN QUALITY:: ADEQUATE

## 2018-03-31 ENCOUNTER — Other Ambulatory Visit: Payer: Self-pay | Admitting: Family Medicine

## 2018-04-02 ENCOUNTER — Telehealth: Payer: Self-pay | Admitting: Internal Medicine

## 2018-04-02 NOTE — Telephone Encounter (Signed)
Pt returned call for lab results. Message given to her with verbal understanding. She would like to try the potassium supplement. [facility] [address].  She saw a urologist yesterday and has another appointment scheduled for 2 weeks. She will call back and schedule an appointment to discuss her medical conditions.  She stated that urology confirmed that she has a kidney stone.

## 2018-04-02 NOTE — Telephone Encounter (Signed)
To forward to provider involved

## 2018-04-03 ENCOUNTER — Ambulatory Visit: Payer: BC Managed Care – PPO | Admitting: Internal Medicine

## 2018-04-03 ENCOUNTER — Other Ambulatory Visit (INDEPENDENT_AMBULATORY_CARE_PROVIDER_SITE_OTHER): Payer: BC Managed Care – PPO

## 2018-04-03 ENCOUNTER — Other Ambulatory Visit: Payer: Self-pay | Admitting: Family Medicine

## 2018-04-03 ENCOUNTER — Encounter: Payer: Self-pay | Admitting: Internal Medicine

## 2018-04-03 VITALS — BP 126/84 | HR 89 | Temp 98.2°F | Ht 62.0 in | Wt 159.0 lb

## 2018-04-03 DIAGNOSIS — N200 Calculus of kidney: Secondary | ICD-10-CM | POA: Diagnosis not present

## 2018-04-03 DIAGNOSIS — I10 Essential (primary) hypertension: Secondary | ICD-10-CM

## 2018-04-03 DIAGNOSIS — J45909 Unspecified asthma, uncomplicated: Secondary | ICD-10-CM

## 2018-04-03 DIAGNOSIS — E876 Hypokalemia: Secondary | ICD-10-CM

## 2018-04-03 DIAGNOSIS — R232 Flushing: Secondary | ICD-10-CM

## 2018-04-03 DIAGNOSIS — Z0000 Encounter for general adult medical examination without abnormal findings: Secondary | ICD-10-CM

## 2018-04-03 LAB — BASIC METABOLIC PANEL
BUN: 13 mg/dL (ref 6–23)
CO2: 23 mEq/L (ref 19–32)
Calcium: 9.6 mg/dL (ref 8.4–10.5)
Chloride: 109 mEq/L (ref 96–112)
Creatinine, Ser: 0.61 mg/dL (ref 0.40–1.20)
GFR: 121.78 mL/min (ref >60.00)
Glucose, Bld: 81 mg/dL (ref 70–99)
Potassium: 3.7 mEq/L (ref 3.5–5.1)
Sodium: 141 mEq/L (ref 135–145)

## 2018-04-03 MED ORDER — POTASSIUM CHLORIDE ER 10 MEQ PO TBCR: 10.0000 meq | EXTENDED_RELEASE_TABLET | Freq: Every day | ORAL | 0 refills | Status: DC

## 2018-04-03 NOTE — Assessment & Plan Note (Signed)
stable overall by history and exam, recent data reviewed with pt, and pt to continue medical treatment as before,  to f/u any worsening symptoms or concerns, f/u urology as planned 

## 2018-04-03 NOTE — Progress Notes (Signed)
Subjective:    Patient ID: [name] Walker, female    DOB: Mar 04, 1960, 58 y.o.   MRN: [id number]  HPI  Here after recent eval and tx here then urology with right renal stone 4 mm by CT;  Saw urology with retained renal stone 2 days ago, not yet passed.  Has some intermittent dysuria and right side pain, but Denies urinary symptoms such as frequency, urgency, flank pain, hematuria or n/v, fever, chills, and urine culture negative.  Did have mild low K with labs, due for f/u.  Has urology f/u at 2 wks.  Pt denies chest pain, increased sob or doe, wheezing, orthopnea, PND, increased LE swelling, palpitations, dizziness or syncope.  No other new complaints Past Medical History:  Diagnosis Date  . ACHILLES TENDINITIS   . ALLERGIC RHINITIS   . Allergy   . Anginal pain ([place]) 04/04/2016   Pt currently feels like she is having muscle spasms and aching in her chest  . Anxiety   . Asthma   . Chronic kidney disease   . GERD   . History of kidney stones   . HYPERTENSION   . LATERAL EPICONDYLITIS, RIGHT   . LOW BACK PAIN   . Plantar fascial fibromatosis   . SUBACROMIAL BURSITIS, RIGHT   . Thoracic scoliosis 10/01/2015   Past Surgical History:  Procedure Laterality Date  . ANTERIOR CERVICAL DECOMP/DISCECTOMY FUSION N/A 04/07/2016   Procedure: Cervical two-three Anterior cervical decompression/discectomy/fusion;  Surgeon: Ashok Pall, MD;  Location: [facility];  Service: Neurosurgery;  Laterality: N/A;  . BACK SURGERY  2015   lower back  . EXTRACORPOREAL SHOCK WAVE LITHOTRIPSY Left 09/28/2016   Procedure: LEFT EXTRACORPOREAL SHOCK WAVE LITHOTRIPSY (ESWL);  Surgeon: Festus Aloe, MD;  Location: [facility];  Service: Urology;  Laterality: Left;  . KIDNEY SURGERY    . LITHOTRIPSY  05/2016  . TUBAL LIGATION      reports that she quit smoking about 2 years ago. Her smoking use included cigarettes. She has a 10.00 pack-year smoking history. She has never used smokeless tobacco. She reports that she does not  drink alcohol or use drugs. family history includes Breast cancer in her maternal aunt; Cancer in her father and sister; Colon cancer in her father and sister; Diabetes in her mother. Allergies  Allergen Reactions  . Flexeril [Cyclobenzaprine] Swelling    Tongue swells  . Lisinopril Other (See Comments)    ? Possible tongue swelling   . Other Palpitations    ALL MUSCLE RELAXERS-PER PATIENT  . Robaxin [Methocarbamol] Other (See Comments)    Tongue swelling   Current Outpatient Medications on File Prior to Visit  Medication Sig Dispense Refill  . albuterol (PROVENTIL HFA;VENTOLIN HFA) 108 (90 Base) MCG/ACT inhaler Inhale 2 puffs into the lungs every 6 (six) hours as needed for wheezing or shortness of breath. 1 Inhaler 11  . amLODipine (NORVASC) 5 MG tablet Take 1 tablet (5 mg total) by mouth daily. 90 tablet 3  . cetirizine (ZYRTEC) 10 MG tablet Take 1 tablet (10 mg total) by mouth daily. 30 tablet 11  . fluticasone (FLONASE) 50 MCG/ACT nasal spray Place 1 spray into both nostrils daily. 16 g 2  . hydrochlorothiazide (HYDRODIURIL) 25 MG tablet Take 1 tablet (25 mg total) by mouth daily. 90 tablet 3  . HYDROcodone-acetaminophen (NORCO/VICODIN) 5-325 MG tablet     . ibuprofen (ADVIL,MOTRIN) 800 MG tablet TAKE 1 TABLET BY MOUTH EVERY 8 HOURS AS NEEDED FOR MODERATE PAIN    . pantoprazole (  PROTONIX) 40 MG tablet Take 1 tablet (40 mg total) by mouth 2 (two) times daily. 60 tablet 11  . sodium chloride (OCEAN) 0.65 % SOLN nasal spray Place 2 sprays into both nostrils daily as needed for congestion.      No current facility-administered medications on file prior to visit.    Review of Systems  Constitutional: Negative for other unusual diaphoresis or sweats HENT: Negative for ear discharge or swelling Eyes: Negative for other worsening visual disturbances Respiratory: Negative for stridor or other swelling  Gastrointestinal: Negative for worsening distension or other blood Genitourinary:  Negative for retention or other urinary change Musculoskeletal: Negative for other MSK pain or swelling Skin: Negative for color change or other new lesions Neurological: Negative for worsening tremors and other numbness  Psychiatric/Behavioral: Negative for worsening agitation or other fatigue All other system neg per pt    Objective:   Physical Exam BP 126/84   Pulse 89   Temp 98.2 F (36.8 C) (Oral)   Ht 5\' 2"  (1.575 m)   Wt 159 lb (72.1 kg)   LMP 08/22/2010   SpO2 98%   BMI 29.08 kg/m  VS noted,  Constitutional: Pt appears in NAD HENT: Head: NCAT.  Right Ear: External ear normal.  Left Ear: External ear normal.  Eyes: . Pupils are equal, round, and reactive to light. Conjunctivae and EOM are normal Nose: without d/c or deformity Neck: Neck supple. Gross normal ROM Cardiovascular: Normal rate and regular rhythm.   Pulmonary/Chest: Effort normal and breath sounds without rales or wheezing.  Abd:  Soft, NT, ND, + BS, no organomegaly Neurological: Pt is alert. At baseline orientation, motor grossly intact Skin: Skin is warm. No rashes, other new lesions, no LE edema Psychiatric: Pt behavior is normal without agitation  No other exam findings Lab Results  Component Value Date   WBC 5.4 03/28/2018   HGB 14.3 03/28/2018   HCT 43.2 03/28/2018   PLT 152.0 03/28/2018   GLUCOSE 81 04/03/2018   CHOL 189 09/06/2017   TRIG 316.0 (H) 09/06/2017   HDL 41.40 09/06/2017   LDLDIRECT 117.0 09/06/2017   LDLCALC 85 07/02/2012   ALT 27 03/28/2018   AST 19 03/28/2018   NA 141 04/03/2018   K 3.7 04/03/2018   CL 109 04/03/2018   CREATININE 0.61 04/03/2018   BUN 13 04/03/2018   CO2 23 04/03/2018   TSH 0.42 09/06/2017   INR 0.98 11/27/2009   HGBA1C 5.3 09/06/2017       Assessment & Plan:

## 2018-04-03 NOTE — Patient Instructions (Addendum)
Please continue all other medications as before, and refills have been done if requested.  Please have the pharmacy call with any other refills you may need.  Please continue your efforts at being more active, low cholesterol diet, and weight control  Please keep your appointments with your specialists as you may have planned  Please go to the LAB in the Basement (turn left off the elevator) for the tests to be done today  You will be contacted by phone if any changes need to be made immediately.  Otherwise, you will receive a letter about your results with an explanation, but please check with MyChart first.  Please remember to sign up for MyChart if you have not done so, as this will be important to you in the future with finding out test results, communicating by private email, and scheduling acute appointments online when needed.  Please return in 6 months, or sooner if needed, with Lab testing done 3-5 days before  You are given the work note today

## 2018-04-03 NOTE — Assessment & Plan Note (Signed)
stable overall by history and exam, recent data reviewed with pt, and pt to continue medical treatment as before,  to f/u any worsening symptoms or concerns  

## 2018-04-03 NOTE — Progress Notes (Signed)
Advised patient regarding food intake that can increase potassium level. She will schedule a follow up with her PCP for chronic medical conditions. She has scheduled follow up with urology.

## 2018-04-06 ENCOUNTER — Encounter (HOSPITAL_COMMUNITY): Payer: Self-pay

## 2018-04-06 ENCOUNTER — Emergency Department (HOSPITAL_COMMUNITY): Payer: BC Managed Care – PPO

## 2018-04-06 ENCOUNTER — Other Ambulatory Visit: Payer: Self-pay

## 2018-04-06 ENCOUNTER — Emergency Department (HOSPITAL_COMMUNITY)
Admission: EM | Admit: 2018-04-06 | Discharge: 2018-04-06 | Disposition: A | Discharge status: Home / Self Care | Payer: BC Managed Care – PPO | Attending: Emergency Medicine | Admitting: Emergency Medicine

## 2018-04-06 DIAGNOSIS — I129 Hypertensive chronic kidney disease with stage 1 through stage 4 chronic kidney disease, or unspecified chronic kidney disease: Secondary | ICD-10-CM | POA: Diagnosis not present

## 2018-04-06 DIAGNOSIS — N189 Chronic kidney disease, unspecified: Secondary | ICD-10-CM | POA: Diagnosis not present

## 2018-04-06 DIAGNOSIS — J45909 Unspecified asthma, uncomplicated: Secondary | ICD-10-CM | POA: Diagnosis not present

## 2018-04-06 DIAGNOSIS — Z87891 Personal history of nicotine dependence: Secondary | ICD-10-CM | POA: Diagnosis not present

## 2018-04-06 DIAGNOSIS — R1031 Right lower quadrant pain: Secondary | ICD-10-CM | POA: Diagnosis not present

## 2018-04-06 DIAGNOSIS — F419 Anxiety disorder, unspecified: Secondary | ICD-10-CM | POA: Insufficient documentation

## 2018-04-06 DIAGNOSIS — Z79899 Other long term (current) drug therapy: Secondary | ICD-10-CM | POA: Insufficient documentation

## 2018-04-06 DIAGNOSIS — N201 Calculus of ureter: Secondary | ICD-10-CM

## 2018-04-06 LAB — URINALYSIS, ROUTINE W REFLEX MICROSCOPIC
Bilirubin Urine: NEGATIVE
Glucose, UA: NEGATIVE mg/dL
Ketones, ur: NEGATIVE mg/dL
Nitrite: NEGATIVE
Protein, ur: NEGATIVE mg/dL
Specific Gravity, Urine: 1.01 (ref 1.005–1.030)
pH: 5 (ref 5.0–8.0)

## 2018-04-06 LAB — COMPREHENSIVE METABOLIC PANEL
ALT: 37 U/L (ref 0–44)
AST: 32 U/L (ref 15–41)
Albumin: 4.4 g/dL (ref 3.5–5.0)
Alkaline Phosphatase: 104 U/L (ref 38–126)
Anion gap: 8 (ref 5–15)
BUN: 13 mg/dL (ref 6–20)
CO2: 23 mmol/L (ref 22–32)
Calcium: 9.7 mg/dL (ref 8.9–10.3)
Chloride: 110 mmol/L (ref 98–111)
Creatinine, Ser: 0.65 mg/dL (ref 0.44–1.00)
GFR calc Af Amer: >60 mL/min (ref >60)
GFR calc non Af Amer: >60 mL/min (ref >60)
Glucose, Bld: 80 mg/dL (ref 70–99)
Potassium: 4.1 mmol/L (ref 3.5–5.1)
Sodium: 141 mmol/L (ref 135–145)
Total Bilirubin: 0.7 mg/dL (ref 0.3–1.2)
Total Protein: 7.5 g/dL (ref 6.5–8.1)

## 2018-04-06 LAB — CBC
HCT: 45.3 % (ref 36.0–46.0)
Hemoglobin: 14.4 g/dL (ref 12.0–15.0)
MCH: 29 pg (ref 26.0–34.0)
MCHC: 31.8 g/dL (ref 30.0–36.0)
MCV: 91.3 fL (ref 80.0–100.0)
Platelets: 171 10*3/uL (ref 150–400)
RBC: 4.96 MIL/uL (ref 3.87–5.11)
RDW: 14.8 % (ref 11.5–15.5)
WBC: 6.1 10*3/uL (ref 4.0–10.5)
nRBC: 0 % (ref 0.0–0.2)

## 2018-04-06 LAB — LIPASE, BLOOD: Lipase: 34 U/L (ref 11–51)

## 2018-04-06 MED ORDER — HYDROCODONE-ACETAMINOPHEN 5-325 MG PO TABS: 1.0000 | ORAL_TABLET | Freq: Four times a day (QID) | ORAL | 0 refills | Status: DC | PRN

## 2018-04-06 MED ORDER — MORPHINE SULFATE (PF) 4 MG/ML IV SOLN: 4.0000 mg | Freq: Once | INTRAVENOUS | Status: DC

## 2018-04-06 MED ORDER — MORPHINE SULFATE (PF) 2 MG/ML IV SOLN
2.0000 mg | Freq: Once | INTRAVENOUS | Status: AC
  Administered 2018-04-06: 2 mg via INTRAVENOUS
  Filled 2018-04-06: qty 1

## 2018-04-06 MED ORDER — MORPHINE SULFATE (PF) 4 MG/ML IV SOLN
4.0000 mg | Freq: Once | INTRAVENOUS | Status: AC
  Administered 2018-04-06: 4 mg via INTRAVENOUS
  Filled 2018-04-06: qty 1

## 2018-04-06 MED ORDER — IOPAMIDOL (ISOVUE-300) INJECTION 61%
INTRAVENOUS | Status: AC
  Filled 2018-04-06: qty 100

## 2018-04-06 MED ORDER — SODIUM CHLORIDE 0.9 % IV SOLN
1.0000 g | Freq: Once | INTRAVENOUS | Status: AC
  Administered 2018-04-06: 1 g via INTRAVENOUS
  Filled 2018-04-06: qty 10

## 2018-04-06 MED ORDER — ONDANSETRON HCL 4 MG/2ML IJ SOLN
4.0000 mg | Freq: Once | INTRAMUSCULAR | Status: AC
  Administered 2018-04-06: 4 mg via INTRAVENOUS
  Filled 2018-04-06: qty 2

## 2018-04-06 MED ORDER — SODIUM CHLORIDE (PF) 0.9 % IJ SOLN
INTRAMUSCULAR | Status: AC
  Filled 2018-04-06: qty 50

## 2018-04-06 MED ORDER — ONDANSETRON 4 MG PO TBDP: 4.0000 mg | ORAL_TABLET | Freq: Three times a day (TID) | ORAL | 0 refills | Status: DC | PRN

## 2018-04-06 MED ORDER — IOPAMIDOL (ISOVUE-300) INJECTION 61%
100.0000 mL | Freq: Once | INTRAVENOUS | Status: AC | PRN
  Administered 2018-04-06: 100 mL via INTRAVENOUS

## 2018-04-06 MED ORDER — SODIUM CHLORIDE 0.9% FLUSH: 3.0000 mL | Freq: Once | INTRAVENOUS | Status: DC

## 2018-04-06 MED ORDER — SODIUM CHLORIDE 0.9 % IV BOLUS
1000.0000 mL | Freq: Once | INTRAVENOUS | Status: AC
  Administered 2018-04-06: 1000 mL via INTRAVENOUS

## 2018-04-06 NOTE — ED Notes (Addendum)
Very difficult to assess pt, states she is hurting as she rubs her abd into her back on rt side which travels to her thigh. On left side abd hurts and goes into her hip. She has told Janett Billow NT she feels she has appendicitis. She does not c/o of pain as I press on these area then suddenly decides it does hurt on the rt lower quad. More discomfort with pressure than release. States she had a normal BM today.

## 2018-04-06 NOTE — Discharge Instructions (Signed)
Hold all Ibuprofen/Aleve or Advil until after the surgery on Monday. You will be schedule to have your stone on the right side treated on Monday morning.  It will be at 7:30am and therefore you'll have to be at the hospital by 5:30am. Do not eat anything after midnight or you will not be able to have your surgery.

## 2018-04-06 NOTE — ED Notes (Signed)
Spoke to lab. Lab confirmed they will add urine culture. MD aware.

## 2018-04-06 NOTE — ED Notes (Signed)
Pt ambulated to the restroom with no assist. Steady gait. No issues observed.

## 2018-04-06 NOTE — ED Provider Notes (Signed)
[facility] DEPT Provider Note   CSN: [id number] Arrival date & time: 04/06/18  1158     History   Chief Complaint Chief Complaint  Patient presents with  . Abdominal Pain  . Leg Pain    HPI [name] Walker is a 58 y.o. female.  HPI Patient reports she had abdominal pain for approximately 8 days.  This is been right lower quadrant.  She reports that she has had some nausea but no vomiting or diarrhea.  She does report burning and urgency with urination.  Patient reports she was seen by her urologist and diagnosed with kidney stone by ultrasound done in the office.  Patient reports he was put on tamsulosin and Percocet and told to drink a lot of fluids.  She reports she has been doing that but her symptoms are worsening.  She reports it now hurts when she moves or walks.  She continues to have urinary urgency and burning.  Patient denies sexual activity.  She reports last sexual activity about 2 years ago.  No concern for possible exposure. Past Medical History:  Diagnosis Date  . ACHILLES TENDINITIS   . ALLERGIC RHINITIS   . Allergy   . Anginal pain ([place]) 04/04/2016   Pt currently feels like she is having muscle spasms and aching in her chest  . Anxiety   . Asthma   . Chronic kidney disease   . GERD   . History of kidney stones   . HYPERTENSION   . LATERAL EPICONDYLITIS, RIGHT   . LOW BACK PAIN   . Plantar fascial fibromatosis   . SUBACROMIAL BURSITIS, RIGHT   . Thoracic scoliosis 10/01/2015    Patient Active Problem List   Diagnosis Date Noted  . Right renal stone 04/03/2018  . Former smoker 04/29/2016  . Bilateral hand pain 04/29/2016  . HNP (herniated nucleus pulposus), cervical 04/07/2016  . Cervical radiculopathy, acute 01/29/2016  . Acute upper respiratory infection 01/29/2016  . Degenerative disc disease, cervical 01/11/2016  . Hyperglycemia 11/17/2015  . Angioedema 10/12/2015  . Bilateral lower extremity edema 10/12/2015  .  Mouth sores 10/12/2015  . Hypokalemia 10/12/2015  . Thoracic scoliosis 10/01/2015  . Peripheral edema 10/01/2015  . Eczema 10/01/2015  . Asthma 10/01/2015  . Breast lump on right side at 4 o'clock position 01/21/2014  . Hot flashes 10/01/2013  . Grief reaction 07/17/2012  . Family history of colon cancer 07/02/2012  . OAB (overactive bladder) 03/01/2012  . Preventative health care 08/23/2010  . Plantar fasciitis 08/23/2010  . UTI (urinary tract infection) 10/19/2008  . ANKLE PAIN, BILATERAL 10/19/2008  . VAGINITIS 09/10/2008  . JOINT EFFUSION, ANKLE 09/10/2008  . SUBACROMIAL BURSITIS, RIGHT 09/01/2008  . ACHILLES TENDINITIS 09/01/2008  . LATERAL EPICONDYLITIS, RIGHT 08/18/2008  . Essential hypertension 07/12/2007  . Allergic rhinitis 07/12/2007  . GERD 07/12/2007  . Chronic low back pain 07/12/2007  . NEPHROLITHIASIS, HX OF 07/12/2007    Past Surgical History:  Procedure Laterality Date  . ANTERIOR CERVICAL DECOMP/DISCECTOMY FUSION N/A 04/07/2016   Procedure: Cervical two-three Anterior cervical decompression/discectomy/fusion;  Surgeon: Ashok Pall, MD;  Location: [facility];  Service: Neurosurgery;  Laterality: N/A;  . BACK SURGERY  2015   lower back  . EXTRACORPOREAL SHOCK WAVE LITHOTRIPSY Left 09/28/2016   Procedure: LEFT EXTRACORPOREAL SHOCK WAVE LITHOTRIPSY (ESWL);  Surgeon: Festus Aloe, MD;  Location: [facility];  Service: Urology;  Laterality: Left;  . KIDNEY SURGERY    . LITHOTRIPSY  05/2016  . TUBAL LIGATION  OB History    Gravida  3   Para  3   Term      Preterm      AB      Living  3     SAB      TAB      Ectopic      Multiple      Live Births               Home Medications    Prior to Admission medications   Medication Sig Start Date End Date Taking? Authorizing Provider  albuterol (PROVENTIL HFA;VENTOLIN HFA) 108 (90 Base) MCG/ACT inhaler Inhale 2 puffs into the lungs every 6 (six) hours as needed for wheezing or shortness of  breath. 10/01/15  Yes Biagio Borg, MD  amLODipine (NORVASC) 5 MG tablet Take 1 tablet (5 mg total) by mouth daily. 09/06/17  Yes Biagio Borg, MD  hydrochlorothiazide (HYDRODIURIL) 25 MG tablet Take 1 tablet (25 mg total) by mouth daily. 09/06/17  Yes Biagio Borg, MD  tamsulosin (FLOMAX) 0.4 MG CAPS capsule Take 0.4 mg by mouth daily. 04/01/18  Yes [provider]  cetirizine (ZYRTEC) 10 MG tablet Take 1 tablet (10 mg total) by mouth daily. Patient not taking: Reported on 04/06/2018 09/06/17 09/06/18  Biagio Borg, MD  fluticasone [facility]) 50 MCG/ACT nasal spray Place 1 spray into both nostrils daily. 06/10/17   Delia Heady, PA-C  HYDROcodone-acetaminophen (NORCO/VICODIN) 5-325 MG tablet  02/26/18   [provider]  ibuprofen (ADVIL,MOTRIN) 800 MG tablet TAKE 1 TABLET BY MOUTH EVERY 8 HOURS AS NEEDED FOR MODERATE PAIN 03/14/18   [provider]  pantoprazole (PROTONIX) 40 MG tablet Take 1 tablet (40 mg total) by mouth 2 (two) times daily. 11/20/17   Ladene Artist, MD  potassium chloride (K-DUR) 10 MEQ tablet Take 1 tablet (10 mEq total) by mouth daily. 04/03/18   Delano Metz, FNP  sodium chloride (OCEAN) 0.65 % SOLN nasal spray Place 2 sprays into both nostrils daily as needed for congestion.     [provider]    Family History Family History  Problem Relation Age of Onset  . Cancer Sister        colon cancer  . Colon cancer Sister   . Diabetes Mother   . Cancer Father        Prostate  . Colon cancer Father   . Breast cancer Maternal Aunt        50's  . Esophageal cancer Neg Hx   . Stomach cancer Neg Hx     Social History Social History   Tobacco Use  . Smoking status: Former Smoker    Packs/day: 0.50    Years: 20.00    Pack years: 10.00    Types: Cigarettes    Last attempt to quit: 02/28/2016    Years since quitting: 2.1  . Smokeless tobacco: Never Used  Substance Use Topics  . Alcohol use: No  . Drug use: No     Allergies     Flexeril [cyclobenzaprine]; Lisinopril; Other; and Robaxin [methocarbamol]   Review of Systems Review of Systems 10 Systems reviewed and are negative for acute change except as noted in the HPI.   Physical Exam Updated Vital Signs BP (!) 153/110 (BP Location: Right Arm)   Pulse 82   Temp 98 F (36.7 C) (Oral)   Resp 18   LMP 08/22/2010   SpO2 100%   Physical Exam Constitutional:  Comments: Patient is alert and clinically well in appearance.  Nontoxic no respiratory distress.  HENT:     Head: Normocephalic and atraumatic.  Eyes:     Extraocular Movements: Extraocular movements intact.  Cardiovascular:     Rate and Rhythm: Normal rate and regular rhythm.  Pulmonary:     Effort: Pulmonary effort is normal.     Breath sounds: Normal breath sounds.  Abdominal:     Comments: Abdomen soft.  Moderate to severe pain to palpation in the right lower quadrant.  No guarding.  Positive right CVA tenderness.  Musculoskeletal: Normal range of motion.        General: No swelling or tenderness.     Right lower leg: No edema.     Left lower leg: No edema.     Comments: Patient is ambulatory to the bathroom and back.  Gait is slightly antalgic due to the lower abdominal pain but no difficulty with gait or stability.  Skin:    General: Skin is warm and dry.  Neurological:     General: No focal deficit present.     Mental Status: She is oriented to person, place, and time.     Coordination: Coordination normal.  Psychiatric:        Mood and Affect: Mood normal.      ED Treatments / Results  Labs (all labs ordered are listed, but only abnormal results are displayed) Labs Reviewed  URINALYSIS, ROUTINE W REFLEX MICROSCOPIC - Abnormal; Notable for the following components:      Result Value   Color, Urine STRAW (*)    Hgb urine dipstick MODERATE (*)    Leukocytes, UA TRACE (*)    Bacteria, UA RARE (*)    All other components within normal limits  URINE CULTURE  LIPASE, BLOOD   COMPREHENSIVE METABOLIC PANEL  CBC    EKG None  Radiology No results found.  Procedures Procedures (including critical care time)  Medications Ordered in ED Medications  sodium chloride flush (NS) 0.9 % injection 3 mL (3 mLs Intravenous Not Given 04/06/18 1417)  morphine 4 MG/ML injection 4 mg (has no administration in time range)  sodium chloride 0.9 % bolus 1,000 mL (has no administration in time range)  cefTRIAXone (ROCEPHIN) 1 g in sodium chloride 0.9 % 100 mL IVPB (has no administration in time range)  iopamidol (ISOVUE-300) 61 % injection (has no administration in time range)  sodium chloride (PF) 0.9 % injection (has no administration in time range)  iopamidol (ISOVUE-300) 61 % injection 100 mL (100 mLs Intravenous Contrast Given 04/06/18 1623)     Initial Impression / Assessment and Plan / ED Course  I have reviewed the triage vital signs and the nursing notes.  Pertinent labs & imaging results that were available during my care of the patient were reviewed by me and considered in my medical decision making (see chart for details).    Patient has significant right lower quadrant pain is been present for approximately a week.  It has been worsening.  Patient has been started on tamsulosin and Percocet without relief.  She continues to have dysuria and urgency.  Urinalysis does have white cells and red blood cells present.  At this time with clinical symptoms that are worsening, will initiate antibiotics.  Patient given an IV dose of Rocephin.  Urine culture obtained.  Will proceed with CT scan for further clarification of kidney stone versus appendicitis versus other possible etiology of abdominal pain.  Dr. Rogene Houston to review  results for final disposition.  Final Clinical Impressions(s) / ED Diagnoses   Final diagnoses:  Right lower quadrant abdominal pain    ED Discharge Orders    None       Charlesetta Shanks, MD 04/06/18 [phone]

## 2018-04-06 NOTE — H&P (View-Only) (Signed)
I have been asked to see the patient by Dr. Charlesetta Shanks, for evaluation and management of right proximal ureteral stone.  History of present illness: 58 year old female with a history of kidney stones who has been having right-sided abdominal pain for the past week or week and a half.  She was seen in our clinic 5 days ago after being seen in the emergency department and her symptoms had improved.  Her pain was tolerable.  A KUB demonstrated a distal ureteral stone.  She was continued on medical expulsion therapy.  However, over the past 24 hours her pain is worsened and she had a significant nausea and vomiting.  She then presented to the emergency department where she had a CT scan which demonstrated a 6 x 10 mm proximal ureteral stone.  Fortunately, her urine analysis and other parameters demonstrate no evidence of infection.  She was treated with pain medication in the emergency department her pain became more reasonably managed and tolerable.  Review of systems: A 12 point comprehensive review of systems was obtained and is negative unless otherwise stated in the history of present illness.  Patient Active Problem List   Diagnosis Date Noted  . Right renal stone 04/03/2018  . Former smoker 04/29/2016  . Bilateral hand pain 04/29/2016  . HNP (herniated nucleus pulposus), cervical 04/07/2016  . Cervical radiculopathy, acute 01/29/2016  . Acute upper respiratory infection 01/29/2016  . Degenerative disc disease, cervical 01/11/2016  . Hyperglycemia 11/17/2015  . Angioedema 10/12/2015  . Bilateral lower extremity edema 10/12/2015  . Mouth sores 10/12/2015  . Hypokalemia 10/12/2015  . Thoracic scoliosis 10/01/2015  . Peripheral edema 10/01/2015  . Eczema 10/01/2015  . Asthma 10/01/2015  . Breast lump on right side at 4 o'clock position 01/21/2014  . Hot flashes 10/01/2013  . Grief reaction 07/17/2012  . Family history of colon cancer 07/02/2012  . OAB (overactive bladder) 03/01/2012   . Preventative health care 08/23/2010  . Plantar fasciitis 08/23/2010  . UTI (urinary tract infection) 10/19/2008  . ANKLE PAIN, BILATERAL 10/19/2008  . VAGINITIS 09/10/2008  . JOINT EFFUSION, ANKLE 09/10/2008  . SUBACROMIAL BURSITIS, RIGHT 09/01/2008  . ACHILLES TENDINITIS 09/01/2008  . LATERAL EPICONDYLITIS, RIGHT 08/18/2008  . Essential hypertension 07/12/2007  . Allergic rhinitis 07/12/2007  . GERD 07/12/2007  . Chronic low back pain 07/12/2007  . NEPHROLITHIASIS, HX OF 07/12/2007    No current facility-administered medications on file prior to encounter.    Current Outpatient Medications on File Prior to Encounter  Medication Sig Dispense Refill  . acetaminophen (TYLENOL) 500 MG tablet Take 1,000 mg by mouth every 6 (six) hours as needed for mild pain.    Marland Kitchen albuterol (PROVENTIL HFA;VENTOLIN HFA) 108 (90 Base) MCG/ACT inhaler Inhale 2 puffs into the lungs every 6 (six) hours as needed for wheezing or shortness of breath. 1 Inhaler 11  . amLODipine (NORVASC) 5 MG tablet Take 1 tablet (5 mg total) by mouth daily. 90 tablet 3  . fluticasone (FLONASE) 50 MCG/ACT nasal spray Place 1 spray into both nostrils daily. 16 g 2  . hydrochlorothiazide (HYDRODIURIL) 25 MG tablet Take 1 tablet (25 mg total) by mouth daily. 90 tablet 3  . ibuprofen (ADVIL,MOTRIN) 800 MG tablet Take 800 mg by mouth every 8 (eight) hours as needed for moderate pain.     . pantoprazole (PROTONIX) 40 MG tablet Take 1 tablet (40 mg total) by mouth 2 (two) times daily. 60 tablet 11  . potassium chloride (K-DUR) 10 MEQ tablet Take 1  tablet (10 mEq total) by mouth daily. 5 tablet 0  . tamsulosin (FLOMAX) 0.4 MG CAPS capsule Take 0.4 mg by mouth daily.    . cetirizine (ZYRTEC) 10 MG tablet Take 1 tablet (10 mg total) by mouth daily. (Patient not taking: Reported on 04/06/2018) 30 tablet 11    Past Medical History:  Diagnosis Date  . ACHILLES TENDINITIS   . ALLERGIC RHINITIS   . Allergy   . Anginal pain ([place])  04/04/2016   Pt currently feels like she is having muscle spasms and aching in her chest  . Anxiety   . Asthma   . Chronic kidney disease   . GERD   . History of kidney stones   . HYPERTENSION   . LATERAL EPICONDYLITIS, RIGHT   . LOW BACK PAIN   . Plantar fascial fibromatosis   . SUBACROMIAL BURSITIS, RIGHT   . Thoracic scoliosis 10/01/2015    Past Surgical History:  Procedure Laterality Date  . ANTERIOR CERVICAL DECOMP/DISCECTOMY FUSION N/A 04/07/2016   Procedure: Cervical two-three Anterior cervical decompression/discectomy/fusion;  Surgeon: Ashok Pall, MD;  Location: [facility];  Service: Neurosurgery;  Laterality: N/A;  . BACK SURGERY  2015   lower back  . EXTRACORPOREAL SHOCK WAVE LITHOTRIPSY Left 09/28/2016   Procedure: LEFT EXTRACORPOREAL SHOCK WAVE LITHOTRIPSY (ESWL);  Surgeon: Festus Aloe, MD;  Location: [facility];  Service: Urology;  Laterality: Left;  . KIDNEY SURGERY    . LITHOTRIPSY  05/2016  . TUBAL LIGATION      Social History   Tobacco Use  . Smoking status: Former Smoker    Packs/day: 0.50    Years: 20.00    Pack years: 10.00    Types: Cigarettes    Last attempt to quit: 02/28/2016    Years since quitting: 2.1  . Smokeless tobacco: Never Used  Substance Use Topics  . Alcohol use: No  . Drug use: No    Family History  Problem Relation Age of Onset  . Cancer Sister        colon cancer  . Colon cancer Sister   . Diabetes Mother   . Cancer Father        Prostate  . Colon cancer Father   . Breast cancer Maternal Aunt        50's  . Esophageal cancer Neg Hx   . Stomach cancer Neg Hx     PE: Vitals:   04/06/18 1211 04/06/18 1351 04/06/18 1605 04/06/18 1640  BP: (!) 141/97 131/72 (!) 153/110 122/74  Pulse: 88 79 82 (!) 21  Resp: 18 18 18  (!) 22  Temp: 98 F (36.7 C)   98.5 F (36.9 C)  TempSrc: Oral   Oral  SpO2: 99% 100% 100% 100%   Patient appears to be in no acute distress  patient is alert and oriented x3 Atraumatic normocephalic head No  cervical or supraclavicular lymphadenopathy appreciated No increased work of breathing, no audible wheezes/rhonchi Regular sinus rhythm/rate Abdomen is soft, nontender, nondistended, severe right sided flank pain to palpation. Lower extremities are symmetric without appreciable edema Grossly neurologically intact No identifiable skin lesions  Recent Labs    04/06/18 1419  WBC 6.1  HGB 14.4  HCT 45.3   Recent Labs    04/06/18 1419  NA 141  K 4.1  CL 110  CO2 23  GLUCOSE 80  BUN 13  CREATININE 0.65  CALCIUM 9.7   No results for input(s): LABPT, INR in the last 72 hours. No results for input(s): LABURIN  in the last 72 hours. Results for orders placed or performed in visit on 03/28/18  Urine Culture     Status: None   Collection Time: 03/28/18  4:42 PM  Result Value Ref Range Status   MICRO NUMBER: [id number]  Final   SPECIMEN QUALITY: Adequate  Final   Sample Source URINE  Final   STATUS: FINAL  Final   Result: No Growth  Final    Imaging: I have independently reviewed the CT scan which demonstrates a large proximal ureteral stone which is obstructing with severe proximal ureteral dilation and peri-nephric stranding.   Imp:  Large proximal right ureteral stone with dilation.  Patient is comfortable here in ED.  No evidence of infection.    Recommendations: I discussed the treatment options for the patient including placing a ureteral stent urgently from the ED tonight or discharging the patient home today and getting her setup for ESWL.  The patient has had stents before and is anxious to avoid this.  She has elected to proceed with ESWL.  I discussed the procedure with her and the risk as well as the benefits.  She understands that she may not be stone free immediately after the procedure.  She will be discharged home today with adequate pain medication and scheduled for ESWL on Monday at 7:30am.  She was advised to remain NPO past midnight past Sunday, avoid  Ibuprofen/Aleve/Motrin.   Ardis Hughs

## 2018-04-06 NOTE — ED Notes (Signed)
Blood attempt by Janett Billow NT, pt on phone no expressing any discomfort.

## 2018-04-06 NOTE — ED Notes (Signed)
Bed: WA17 Expected date:  Expected time:  Means of arrival:  Comments: No Monitor or bed

## 2018-04-06 NOTE — ED Notes (Signed)
Dr Louis Meckel in to talk with pt regarding Kidney stone.

## 2018-04-06 NOTE — ED Triage Notes (Signed)
Pt states since Monday she has had right lower abdominal pain radiating into thigh.  Also c/o left lower abdominal to hip pain.  MD told her Wednesday and told her that her potassium was low.  Pt took potassium since then.  Also c/o nausea.  No fever.  No vomiting.  No injury.

## 2018-04-06 NOTE — ED Provider Notes (Addendum)
CT results discussed with on-call urology Dr. Louis Meckel.  He will see the patient after he completes his urological procedure here at [facility].  He is thinking in terms of probably lithotripsy on Monday.  Patient made aware of the plan.  We will keep her comfortable in the meantime.  He wants to avoid Toradol.  CT shows a right proximal ureter stone measuring about 10 mm in size with significant hydronephrosis.  Their assessment is is probably not a significant urinary tract infection ongoing.   Fredia Sorrow, MD 04/06/18 1756  Seen by Dr. Louis Meckel from urology.  He is going to set her up for lithotripsy on Monday.  He brought some instructions and were treated with hydrocodone to help with the pain in the meantime.  Also will give her some antinausea medicine.  Patient will return for any new or worse symptoms.  Patient knows to avoid Motrin Naprosyn Advil Aleve.    Fredia Sorrow, MD 04/06/18 [phone]

## 2018-04-06 NOTE — Consult Note (Signed)
I have been asked to see the patient by Dr. Charlesetta Shanks, for evaluation and management of right proximal ureteral stone.  History of present illness: 58 year old female with a history of kidney stones who has been having right-sided abdominal pain for the past week or week and a half.  She was seen in our clinic 5 days ago after being seen in the emergency department and her symptoms had improved.  Her pain was tolerable.  A KUB demonstrated a distal ureteral stone.  She was continued on medical expulsion therapy.  However, over the past 24 hours her pain is worsened and she had a significant nausea and vomiting.  She then presented to the emergency department where she had a CT scan which demonstrated a 6 x 10 mm proximal ureteral stone.  Fortunately, her urine analysis and other parameters demonstrate no evidence of infection.  She was treated with pain medication in the emergency department her pain became more reasonably managed and tolerable.  Review of systems: A 12 point comprehensive review of systems was obtained and is negative unless otherwise stated in the history of present illness.  Patient Active Problem List   Diagnosis Date Noted  . Right renal stone 04/03/2018  . Former smoker 04/29/2016  . Bilateral hand pain 04/29/2016  . HNP (herniated nucleus pulposus), cervical 04/07/2016  . Cervical radiculopathy, acute 01/29/2016  . Acute upper respiratory infection 01/29/2016  . Degenerative disc disease, cervical 01/11/2016  . Hyperglycemia 11/17/2015  . Angioedema 10/12/2015  . Bilateral lower extremity edema 10/12/2015  . Mouth sores 10/12/2015  . Hypokalemia 10/12/2015  . Thoracic scoliosis 10/01/2015  . Peripheral edema 10/01/2015  . Eczema 10/01/2015  . Asthma 10/01/2015  . Breast lump on right side at 4 o'clock position 01/21/2014  . Hot flashes 10/01/2013  . Grief reaction 07/17/2012  . Family history of colon cancer 07/02/2012  . OAB (overactive bladder) 03/01/2012   . Preventative health care 08/23/2010  . Plantar fasciitis 08/23/2010  . UTI (urinary tract infection) 10/19/2008  . ANKLE PAIN, BILATERAL 10/19/2008  . VAGINITIS 09/10/2008  . JOINT EFFUSION, ANKLE 09/10/2008  . SUBACROMIAL BURSITIS, RIGHT 09/01/2008  . ACHILLES TENDINITIS 09/01/2008  . LATERAL EPICONDYLITIS, RIGHT 08/18/2008  . Essential hypertension 07/12/2007  . Allergic rhinitis 07/12/2007  . GERD 07/12/2007  . Chronic low back pain 07/12/2007  . NEPHROLITHIASIS, HX OF 07/12/2007    No current facility-administered medications on file prior to encounter.    Current Outpatient Medications on File Prior to Encounter  Medication Sig Dispense Refill  . acetaminophen (TYLENOL) 500 MG tablet Take 1,000 mg by mouth every 6 (six) hours as needed for mild pain.    Marland Kitchen albuterol (PROVENTIL HFA;VENTOLIN HFA) 108 (90 Base) MCG/ACT inhaler Inhale 2 puffs into the lungs every 6 (six) hours as needed for wheezing or shortness of breath. 1 Inhaler 11  . amLODipine (NORVASC) 5 MG tablet Take 1 tablet (5 mg total) by mouth daily. 90 tablet 3  . fluticasone (FLONASE) 50 MCG/ACT nasal spray Place 1 spray into both nostrils daily. 16 g 2  . hydrochlorothiazide (HYDRODIURIL) 25 MG tablet Take 1 tablet (25 mg total) by mouth daily. 90 tablet 3  . ibuprofen (ADVIL,MOTRIN) 800 MG tablet Take 800 mg by mouth every 8 (eight) hours as needed for moderate pain.     . pantoprazole (PROTONIX) 40 MG tablet Take 1 tablet (40 mg total) by mouth 2 (two) times daily. 60 tablet 11  . potassium chloride (K-DUR) 10 MEQ tablet Take 1  tablet (10 mEq total) by mouth daily. 5 tablet 0  . tamsulosin (FLOMAX) 0.4 MG CAPS capsule Take 0.4 mg by mouth daily.    . cetirizine (ZYRTEC) 10 MG tablet Take 1 tablet (10 mg total) by mouth daily. (Patient not taking: Reported on 04/06/2018) 30 tablet 11    Past Medical History:  Diagnosis Date  . ACHILLES TENDINITIS   . ALLERGIC RHINITIS   . Allergy   . Anginal pain ([place])  04/04/2016   Pt currently feels like she is having muscle spasms and aching in her chest  . Anxiety   . Asthma   . Chronic kidney disease   . GERD   . History of kidney stones   . HYPERTENSION   . LATERAL EPICONDYLITIS, RIGHT   . LOW BACK PAIN   . Plantar fascial fibromatosis   . SUBACROMIAL BURSITIS, RIGHT   . Thoracic scoliosis 10/01/2015    Past Surgical History:  Procedure Laterality Date  . ANTERIOR CERVICAL DECOMP/DISCECTOMY FUSION N/A 04/07/2016   Procedure: Cervical two-three Anterior cervical decompression/discectomy/fusion;  Surgeon: Ashok Pall, MD;  Location: [facility];  Service: Neurosurgery;  Laterality: N/A;  . BACK SURGERY  2015   lower back  . EXTRACORPOREAL SHOCK WAVE LITHOTRIPSY Left 09/28/2016   Procedure: LEFT EXTRACORPOREAL SHOCK WAVE LITHOTRIPSY (ESWL);  Surgeon: Festus Aloe, MD;  Location: [facility];  Service: Urology;  Laterality: Left;  . KIDNEY SURGERY    . LITHOTRIPSY  05/2016  . TUBAL LIGATION      Social History   Tobacco Use  . Smoking status: Former Smoker    Packs/day: 0.50    Years: 20.00    Pack years: 10.00    Types: Cigarettes    Last attempt to quit: 02/28/2016    Years since quitting: 2.1  . Smokeless tobacco: Never Used  Substance Use Topics  . Alcohol use: No  . Drug use: No    Family History  Problem Relation Age of Onset  . Cancer Sister        colon cancer  . Colon cancer Sister   . Diabetes Mother   . Cancer Father        Prostate  . Colon cancer Father   . Breast cancer Maternal Aunt        50's  . Esophageal cancer Neg Hx   . Stomach cancer Neg Hx     PE: Vitals:   04/06/18 1211 04/06/18 1351 04/06/18 1605 04/06/18 1640  BP: (!) 141/97 131/72 (!) 153/110 122/74  Pulse: 88 79 82 (!) 21  Resp: 18 18 18  (!) 22  Temp: 98 F (36.7 C)   98.5 F (36.9 C)  TempSrc: Oral   Oral  SpO2: 99% 100% 100% 100%   Patient appears to be in no acute distress  patient is alert and oriented x3 Atraumatic normocephalic head No  cervical or supraclavicular lymphadenopathy appreciated No increased work of breathing, no audible wheezes/rhonchi Regular sinus rhythm/rate Abdomen is soft, nontender, nondistended, severe right sided flank pain to palpation. Lower extremities are symmetric without appreciable edema Grossly neurologically intact No identifiable skin lesions  Recent Labs    04/06/18 1419  WBC 6.1  HGB 14.4  HCT 45.3   Recent Labs    04/06/18 1419  NA 141  K 4.1  CL 110  CO2 23  GLUCOSE 80  BUN 13  CREATININE 0.65  CALCIUM 9.7   No results for input(s): LABPT, INR in the last 72 hours. No results for input(s): LABURIN  in the last 72 hours. Results for orders placed or performed in visit on 03/28/18  Urine Culture     Status: None   Collection Time: 03/28/18  4:42 PM  Result Value Ref Range Status   MICRO NUMBER: [id number]  Final   SPECIMEN QUALITY: Adequate  Final   Sample Source URINE  Final   STATUS: FINAL  Final   Result: No Growth  Final    Imaging: I have independently reviewed the CT scan which demonstrates a large proximal ureteral stone which is obstructing with severe proximal ureteral dilation and peri-nephric stranding.   Imp:  Large proximal right ureteral stone with dilation.  Patient is comfortable here in ED.  No evidence of infection.    Recommendations: I discussed the treatment options for the patient including placing a ureteral stent urgently from the ED tonight or discharging the patient home today and getting her setup for ESWL.  The patient has had stents before and is anxious to avoid this.  She has elected to proceed with ESWL.  I discussed the procedure with her and the risk as well as the benefits.  She understands that she may not be stone free immediately after the procedure.  She will be discharged home today with adequate pain medication and scheduled for ESWL on Monday at 7:30am.  She was advised to remain NPO past midnight past Sunday, avoid  Ibuprofen/Aleve/Motrin.   Ardis Hughs

## 2018-04-08 ENCOUNTER — Other Ambulatory Visit: Payer: Self-pay

## 2018-04-08 ENCOUNTER — Encounter (HOSPITAL_COMMUNITY): Payer: Self-pay | Admitting: *Deleted

## 2018-04-08 ENCOUNTER — Ambulatory Visit (HOSPITAL_COMMUNITY)
Admission: RE | Admit: 2018-04-08 | Discharge: 2018-04-08 | Disposition: A | Discharge status: Home / Self Care | Payer: BC Managed Care – PPO | Source: Other Acute Inpatient Hospital | Attending: Urology | Admitting: Urology

## 2018-04-08 ENCOUNTER — Ambulatory Visit (HOSPITAL_COMMUNITY): Payer: BC Managed Care – PPO

## 2018-04-08 ENCOUNTER — Encounter (HOSPITAL_COMMUNITY)
Admission: RE | Disposition: A | Discharge status: Home / Self Care | Payer: Self-pay | Source: Other Acute Inpatient Hospital | Attending: Urology

## 2018-04-08 DIAGNOSIS — M4134 Thoracogenic scoliosis, thoracic region: Secondary | ICD-10-CM | POA: Insufficient documentation

## 2018-04-08 DIAGNOSIS — Z981 Arthrodesis status: Secondary | ICD-10-CM | POA: Insufficient documentation

## 2018-04-08 DIAGNOSIS — E876 Hypokalemia: Secondary | ICD-10-CM | POA: Diagnosis not present

## 2018-04-08 DIAGNOSIS — L309 Dermatitis, unspecified: Secondary | ICD-10-CM | POA: Insufficient documentation

## 2018-04-08 DIAGNOSIS — Z7951 Long term (current) use of inhaled steroids: Secondary | ICD-10-CM | POA: Diagnosis not present

## 2018-04-08 DIAGNOSIS — Z87891 Personal history of nicotine dependence: Secondary | ICD-10-CM | POA: Diagnosis not present

## 2018-04-08 DIAGNOSIS — Z803 Family history of malignant neoplasm of breast: Secondary | ICD-10-CM | POA: Insufficient documentation

## 2018-04-08 DIAGNOSIS — G8929 Other chronic pain: Secondary | ICD-10-CM | POA: Diagnosis not present

## 2018-04-08 DIAGNOSIS — M5020 Other cervical disc displacement, unspecified cervical region: Secondary | ICD-10-CM | POA: Insufficient documentation

## 2018-04-08 DIAGNOSIS — N201 Calculus of ureter: Secondary | ICD-10-CM | POA: Diagnosis not present

## 2018-04-08 DIAGNOSIS — I10 Essential (primary) hypertension: Secondary | ICD-10-CM | POA: Insufficient documentation

## 2018-04-08 DIAGNOSIS — Z87442 Personal history of urinary calculi: Secondary | ICD-10-CM | POA: Insufficient documentation

## 2018-04-08 DIAGNOSIS — Z791 Long term (current) use of non-steroidal anti-inflammatories (NSAID): Secondary | ICD-10-CM | POA: Diagnosis not present

## 2018-04-08 DIAGNOSIS — Z800 Family history of malignant neoplasm of digestive organs: Secondary | ICD-10-CM | POA: Insufficient documentation

## 2018-04-08 DIAGNOSIS — Z833 Family history of diabetes mellitus: Secondary | ICD-10-CM | POA: Diagnosis not present

## 2018-04-08 DIAGNOSIS — Z8042 Family history of malignant neoplasm of prostate: Secondary | ICD-10-CM | POA: Diagnosis not present

## 2018-04-08 DIAGNOSIS — K219 Gastro-esophageal reflux disease without esophagitis: Secondary | ICD-10-CM | POA: Diagnosis not present

## 2018-04-08 DIAGNOSIS — N200 Calculus of kidney: Secondary | ICD-10-CM

## 2018-04-08 DIAGNOSIS — R739 Hyperglycemia, unspecified: Secondary | ICD-10-CM | POA: Diagnosis not present

## 2018-04-08 DIAGNOSIS — N3281 Overactive bladder: Secondary | ICD-10-CM | POA: Insufficient documentation

## 2018-04-08 DIAGNOSIS — Z79899 Other long term (current) drug therapy: Secondary | ICD-10-CM | POA: Diagnosis not present

## 2018-04-08 HISTORY — PX: EXTRACORPOREAL SHOCK WAVE LITHOTRIPSY: SHX1557

## 2018-04-08 LAB — URINE CULTURE

## 2018-04-08 SURGERY — LITHOTRIPSY, ESWL
Anesthesia: Moderate Sedation | Laterality: Right

## 2018-04-08 MED ORDER — KETOROLAC TROMETHAMINE 15 MG/ML IJ SOLN
15.0000 mg | Freq: Once | INTRAMUSCULAR | Status: AC
  Administered 2018-04-08: 15 mg via INTRAVENOUS

## 2018-04-08 MED ORDER — KETOROLAC TROMETHAMINE 15 MG/ML IJ SOLN
INTRAMUSCULAR | Status: AC
  Filled 2018-04-08: qty 1

## 2018-04-08 MED ORDER — FENTANYL CITRATE (PF) 100 MCG/2ML IJ SOLN: 25.0000 ug | Freq: Once | INTRAMUSCULAR | Status: DC

## 2018-04-08 MED ORDER — OXYCODONE HCL 5 MG PO TABS
10.0000 mg | ORAL_TABLET | Freq: Once | ORAL | Status: AC
  Administered 2018-04-08: 10 mg via ORAL

## 2018-04-08 MED ORDER — OXYCODONE HCL 5 MG PO TABS
ORAL_TABLET | ORAL | Status: AC
  Filled 2018-04-08: qty 2

## 2018-04-08 MED ORDER — OXYCODONE HCL 5 MG/5ML PO SOLN: 5.0000 mg | Freq: Once | ORAL | Status: AC

## 2018-04-08 MED ORDER — SODIUM CHLORIDE 0.9 % IV SOLN
INTRAVENOUS | Status: DC
  Administered 2018-04-08: 08:00:00 via INTRAVENOUS

## 2018-04-08 MED ORDER — DIPHENHYDRAMINE HCL 25 MG PO CAPS
25.0000 mg | ORAL_CAPSULE | Freq: Once | ORAL | Status: AC
  Administered 2018-04-08: 25 mg via ORAL
  Filled 2018-04-08: qty 1

## 2018-04-08 MED ORDER — FENTANYL CITRATE (PF) 100 MCG/2ML IJ SOLN
INTRAMUSCULAR | Status: AC
  Administered 2018-04-08: 25 ug
  Filled 2018-04-08: qty 2

## 2018-04-08 MED ORDER — DIAZEPAM 5 MG PO TABS
ORAL_TABLET | ORAL | Status: AC
  Filled 2018-04-08: qty 2

## 2018-04-08 MED ORDER — DIAZEPAM 2 MG PO TABS
10.0000 mg | ORAL_TABLET | Freq: Once | ORAL | Status: AC
  Administered 2018-04-08: 10 mg via ORAL

## 2018-04-08 MED ORDER — CIPROFLOXACIN HCL 500 MG PO TABS
500.0000 mg | ORAL_TABLET | Freq: Once | ORAL | Status: AC
  Administered 2018-04-08: 500 mg via ORAL
  Filled 2018-04-08: qty 1

## 2018-04-08 NOTE — Progress Notes (Signed)
Dr, Louis Meckel came to see the patient in her recliner in Phase 2 of PACU after her lithotripsy procedure.  Patient c/o sever flank pain after the procedure.  New orders noted and patient stayed on the pulse oximeter without any trouble.  Wrote the patient a work note for [employer], where she works for the next 2 days, 3 days including today.  Patient plans to take her blood pressure medications when she returns home.  She will followup with Dr Louis Meckel in 2 weeks, but sooner if she has any trouble.  Husband is aware of discharge instructions.  If she needs another work note, she will receive this from Dr [employer] office.

## 2018-04-08 NOTE — Op Note (Signed)
See [facility] OP note scanned into chart. Also because of the size, density, location and other factors that cannot be anticipated I feel this will likely be a staged procedure. This fact supersedes any indication in the scanned [facility] operative note to the contrary.  

## 2018-04-08 NOTE — Discharge Instructions (Signed)
See [facility] discharge instructions in chart.  

## 2018-04-08 NOTE — Interval H&P Note (Signed)
History and Physical Interval Note:  04/08/2018 7:38 AM  [name] Walker  has presented today for surgery, with the diagnosis of kidney stones  The various methods of treatment have been discussed with the patient and family. After consideration of risks, benefits and other options for treatment, the patient has consented to  Procedure(s): EXTRACORPOREAL SHOCK WAVE LITHOTRIPSY (ESWL) (Right) as a surgical intervention .  The patient's history has been reviewed, patient examined, no change in status, stable for surgery.  I have reviewed the patient's chart and labs.  Questions were answered to the patient's satisfaction.     Ardis Hughs

## 2018-04-09 ENCOUNTER — Encounter (HOSPITAL_COMMUNITY): Payer: Self-pay | Admitting: Urology

## 2018-04-22 ENCOUNTER — Ambulatory Visit: Payer: Self-pay | Admitting: *Deleted

## 2018-04-22 NOTE — Telephone Encounter (Signed)
Pt calling with complaint of pain in legs and arms since last week after seeing urologist for kidney stones. Pt states that the pain is like like a tooth ache like nagging and rates pain at 10. Pt states she has been taking Tylenol 500 mg and eases the pain off a little bit. Pt states she has appt to see urologist tomorrow at 12:30 pm. Pt thought that pain was due to low potassium but notified pt that last potassium level was within normal range. Pt scheduled for appt tomorrow with Dr. Jenny Reichmann and advised to return call to office with worsening symptoms. Understanding verbalized.  Reason for Disposition . [1] MODERATE pain (e.g., interferes with normal activities, limping) AND [2] present > 3 days  Answer Assessment - Initial Assessment Questions 1. ONSET: "When did the pain start?"      Last week after being seen by urologist for kidney stones 2. LOCATION: "Where is the pain located?"      Both legs 3. PAIN: "How bad is the pain?"    (Scale 1-10; or mild, moderate, severe)   -  MILD (1-3): doesn't interfere with normal activities    -  MODERATE (4-7): interferes with normal activities (e.g., work or school) or awakens from sleep, limping    -  SEVERE (8-10): excruciating pain, unable to do any normal activities, unable to walk     10 4. WORK OR EXERCISE: "Has there been any recent work or exercise that involved this part of the body?"      No 5. CAUSE: "What do you think is causing the leg pain?"     Unsure thought it was due to low potassium 6. OTHER SYMPTOMS: "Do you have any other symptoms?" (e.g., chest pain, back pain, breathing difficulty, swelling, rash, fever, numbness, weakness)     Back pain but that is from surgery always had back pain 7. PREGNANCY: "Is there any chance you are pregnant?" "When was your last menstrual period?"     No menstrual cycles in years  Answer Assessment - Initial Assessment Questions 1. ONSET: "When did the pain start?"     Last week 2. LOCATION: "Where is  the pain located?"     Bilateral arms 3. PAIN: "How bad is the pain?" (Scale 1-10; or mild, moderate, severe)   - MILD (1-3): doesn't interfere with normal activities   - MODERATE (4-7): interferes with normal activities (e.g., work or school) or awakens from sleep   - SEVERE (8-10): excruciating pain, unable to do any normal activities, unable to hold a cup of water     10 4. WORK OR EXERCISE: "Has there been any recent work or exercise that involved this part of the body?"     No 5. CAUSE: "What do you think is causing the arm pain?"     unsure 6. OTHER SYMPTOMS: "Do you have any other symptoms?" (e.g., neck pain, swelling, rash, fever, numbness, weakness)     No 7. PREGNANCY: "Is there any chance you are pregnant?" "When was your last menstrual period?"     No menstrual cycles in years  Protocols used: LEG PAIN-A-AH, ARM PAIN-A-AH

## 2018-04-23 ENCOUNTER — Other Ambulatory Visit (INDEPENDENT_AMBULATORY_CARE_PROVIDER_SITE_OTHER): Payer: BC Managed Care – PPO

## 2018-04-23 ENCOUNTER — Encounter: Payer: Self-pay | Admitting: Internal Medicine

## 2018-04-23 ENCOUNTER — Ambulatory Visit: Payer: BC Managed Care – PPO | Admitting: Internal Medicine

## 2018-04-23 VITALS — BP 114/68 | HR 80 | Temp 98.3°F | Ht 62.0 in | Wt 157.0 lb

## 2018-04-23 DIAGNOSIS — M5410 Radiculopathy, site unspecified: Secondary | ICD-10-CM

## 2018-04-23 DIAGNOSIS — N200 Calculus of kidney: Secondary | ICD-10-CM

## 2018-04-23 DIAGNOSIS — M791 Myalgia, unspecified site: Secondary | ICD-10-CM

## 2018-04-23 DIAGNOSIS — M2550 Pain in unspecified joint: Secondary | ICD-10-CM | POA: Insufficient documentation

## 2018-04-23 LAB — CBC WITH DIFFERENTIAL/PLATELET
Basophils Absolute: 0 10*3/uL (ref 0.0–0.1)
Basophils Relative: 0.6 % (ref 0.0–3.0)
Eosinophils Absolute: 0.1 10*3/uL (ref 0.0–0.7)
Eosinophils Relative: 1.6 % (ref 0.0–5.0)
HCT: 44.5 % (ref 36.0–46.0)
Hemoglobin: 14.5 g/dL (ref 12.0–15.0)
Lymphocytes Relative: 25.8 % (ref 12.0–46.0)
Lymphs Abs: 1.3 10*3/uL (ref 0.7–4.0)
MCHC: 32.5 g/dL (ref 30.0–36.0)
MCV: 88.4 fl (ref 78.0–100.0)
Monocytes Absolute: 0.5 10*3/uL (ref 0.1–1.0)
Monocytes Relative: 9.5 % (ref 3.0–12.0)
Neutro Abs: 3.1 10*3/uL (ref 1.4–7.7)
Neutrophils Relative %: 62.5 % (ref 43.0–77.0)
Platelets: 145 10*3/uL — ABNORMAL LOW (ref 150.0–400.0)
RBC: 5.03 Mil/uL (ref 3.87–5.11)
RDW: 14.1 % (ref 11.5–15.5)
WBC: 4.9 10*3/uL (ref 4.0–10.5)

## 2018-04-23 LAB — HEPATIC FUNCTION PANEL
ALT: 24 U/L (ref 0–35)
AST: 21 U/L (ref 0–37)
Albumin: 4.3 g/dL (ref 3.5–5.2)
Alkaline Phosphatase: 115 U/L (ref 39–117)
Bilirubin, Direct: 0.2 mg/dL (ref 0.0–0.3)
Total Bilirubin: 0.7 mg/dL (ref 0.2–1.2)
Total Protein: 7.2 g/dL (ref 6.0–8.3)

## 2018-04-23 LAB — BASIC METABOLIC PANEL
BUN: 13 mg/dL (ref 6–23)
CO2: 26 mEq/L (ref 19–32)
Calcium: 9.8 mg/dL (ref 8.4–10.5)
Chloride: 108 mEq/L (ref 96–112)
Creatinine, Ser: 0.64 mg/dL (ref 0.40–1.20)
GFR: 115.2 mL/min (ref >60.00)
Glucose, Bld: 96 mg/dL (ref 70–99)
Potassium: 3.8 mEq/L (ref 3.5–5.1)
Sodium: 142 mEq/L (ref 135–145)

## 2018-04-23 LAB — CK: Total CK: 172 U/L (ref 7–177)

## 2018-04-23 MED ORDER — GABAPENTIN 100 MG PO CAPS: 100.0000 mg | ORAL_CAPSULE | Freq: Three times a day (TID) | ORAL | 3 refills | Status: DC

## 2018-04-23 NOTE — Assessment & Plan Note (Signed)
?   Sciatica like pain, for trial gabapentin 100 tid, consider LS spine mri

## 2018-04-23 NOTE — Progress Notes (Signed)
Subjective:    Patient ID: [name] Walker, female    DOB: Jul 26, 1960, 58 y.o.   MRN: [id number]  HPI  Here with 1 wk onset LBP across the lower back somewhat more left than right, with bialt upper and lower leg discomfort to the feet with tingling in the feet, but no weakness, gait change or falls.  Also has myalgias to the leg and upper arms as well, and occasional very mild infrequent random post neck pains.  Has some nausea mild intermittent, as well as persistent right sided lateral abd discomfort aching like after s/p lithotrypsy feb 10, passed some fragments.  Pt denies chest pain, increased sob or doe, wheezing, orthopnea, PND, increased LE swelling, palpitations, dizziness or syncope.  Pt denies other new neurological symptoms such as new headache, or facial or extremity weakness.   Pt denies fever, wt loss, night sweats, loss of appetite, or other constitutional symptoms Past Medical History:  Diagnosis Date  . ACHILLES TENDINITIS   . ALLERGIC RHINITIS   . Allergy   . Anginal pain ([place]) 04/04/2016   Pt currently feels like she is having muscle spasms and aching in her chest  . Anxiety   . Asthma   . Chronic kidney disease   . GERD   . History of kidney stones   . HYPERTENSION   . LATERAL EPICONDYLITIS, RIGHT   . LOW BACK PAIN   . Plantar fascial fibromatosis   . SUBACROMIAL BURSITIS, RIGHT   . Thoracic scoliosis 10/01/2015   Past Surgical History:  Procedure Laterality Date  . ANTERIOR CERVICAL DECOMP/DISCECTOMY FUSION N/A 04/07/2016   Procedure: Cervical two-three Anterior cervical decompression/discectomy/fusion;  Surgeon: Ashok Pall, MD;  Location: [facility];  Service: Neurosurgery;  Laterality: N/A;  . BACK SURGERY  2015   lower back  . EXTRACORPOREAL SHOCK WAVE LITHOTRIPSY Left 09/28/2016   Procedure: LEFT EXTRACORPOREAL SHOCK WAVE LITHOTRIPSY (ESWL);  Surgeon: Festus Aloe, MD;  Location: [facility];  Service: Urology;  Laterality: Left;  . EXTRACORPOREAL SHOCK WAVE  LITHOTRIPSY Right 04/08/2018   Procedure: EXTRACORPOREAL SHOCK WAVE LITHOTRIPSY (ESWL);  Surgeon: Ardis Hughs, MD;  Location: [facility];  Service: Urology;  Laterality: Right;  . KIDNEY SURGERY    . LITHOTRIPSY  05/2016  . TUBAL LIGATION      reports that she quit smoking about 2 years ago. Her smoking use included cigarettes. She has a 10.00 pack-year smoking history. She has never used smokeless tobacco. She reports that she does not drink alcohol or use drugs. family history includes Breast cancer in her maternal aunt; Cancer in her father and sister; Colon cancer in her father and sister; Diabetes in her mother. Allergies  Allergen Reactions  . Flexeril [Cyclobenzaprine] Swelling    Tongue swells  . Lisinopril Other (See Comments)    ? Possible tongue swelling   . Other Palpitations    ALL MUSCLE RELAXERS-PER PATIENT  . Robaxin [Methocarbamol] Other (See Comments)    Tongue swelling  . Contrast Media [Iodinated Diagnostic Agents] Itching   Current Outpatient Medications on File Prior to Visit  Medication Sig Dispense Refill  . acetaminophen (TYLENOL) 500 MG tablet Take 1,000 mg by mouth every 6 (six) hours as needed for mild pain.    Marland Kitchen albuterol (PROVENTIL HFA;VENTOLIN HFA) 108 (90 Base) MCG/ACT inhaler Inhale 2 puffs into the lungs every 6 (six) hours as needed for wheezing or shortness of breath. 1 Inhaler 11  . amLODipine (NORVASC) 5 MG tablet Take 1 tablet (5 mg total)  by mouth daily. 90 tablet 3  . cetirizine (ZYRTEC) 10 MG tablet Take 1 tablet (10 mg total) by mouth daily. 30 tablet 11  . fluticasone (FLONASE) 50 MCG/ACT nasal spray Place 1 spray into both nostrils daily. 16 g 2  . hydrochlorothiazide (HYDRODIURIL) 25 MG tablet Take 1 tablet (25 mg total) by mouth daily. 90 tablet 3  . ibuprofen (ADVIL,MOTRIN) 800 MG tablet Take 800 mg by mouth every 8 (eight) hours as needed for moderate pain.     Marland Kitchen ondansetron (ZOFRAN ODT) 4 MG disintegrating tablet Take 1 tablet (4 mg  total) by mouth every 8 (eight) hours as needed for nausea or vomiting. 12 tablet 0  . pantoprazole (PROTONIX) 40 MG tablet Take 1 tablet (40 mg total) by mouth 2 (two) times daily. 60 tablet 11  . potassium chloride (K-DUR) 10 MEQ tablet Take 1 tablet (10 mEq total) by mouth daily. 5 tablet 0  . tamsulosin (FLOMAX) 0.4 MG CAPS capsule Take 0.4 mg by mouth daily.     No current facility-administered medications on file prior to visit.    Review of Systems  Constitutional: Negative for other unusual diaphoresis or sweats HENT: Negative for ear discharge or swelling Eyes: Negative for other worsening visual disturbances Respiratory: Negative for stridor or other swelling  Gastrointestinal: Negative for worsening distension or other blood Genitourinary: Negative for retention or other urinary change Musculoskeletal: Negative for other MSK pain or swelling Skin: Negative for color change or other new lesions Neurological: Negative for worsening tremors and other numbness  Psychiatric/Behavioral: Negative for worsening agitation or other fatigue All other system neg per pt    Objective:   Physical Exam BP 114/68   Pulse 80   Temp 98.3 F (36.8 C) (Oral)   Ht 5\' 2"  (1.575 m)   Wt 157 lb (71.2 kg)   LMP 08/22/2010   SpO2 96%   BMI 28.72 kg/m  VS noted,  Constitutional: Pt appears in NAD HENT: Head: NCAT.  Right Ear: External ear normal.  Left Ear: External ear normal.  Eyes: . Pupils are equal, round, and reactive to light. Conjunctivae and EOM are normal Nose: without d/c or deformity Neck: Neck supple. Gross normal ROM Cardiovascular: Normal rate and regular rhythm.   Pulmonary/Chest: Effort normal and breath sounds without rales or wheezing.  Abd:  Soft, NT, ND, + BS, no organomegaly Spine: nontender midline lumbar including paravertebral  Neurological: Pt is alert. At baseline orientation, motor grossly intact Skin: Skin is warm. No rashes, other new lesions, no LE  edema Psychiatric: Pt behavior is normal without agitation  All other system neg per pt Lab Results  Component Value Date   WBC 4.9 04/23/2018   HGB 14.5 04/23/2018   HCT 44.5 04/23/2018   PLT 145.0 (L) 04/23/2018   GLUCOSE 96 04/23/2018   CHOL 189 09/06/2017   TRIG 316.0 (H) 09/06/2017   HDL 41.40 09/06/2017   LDLDIRECT 117.0 09/06/2017   LDLCALC 85 07/02/2012   ALT 24 04/23/2018   AST 21 04/23/2018   NA 142 04/23/2018   K 3.8 04/23/2018   CL 108 04/23/2018   CREATININE 0.64 04/23/2018   BUN 13 04/23/2018   CO2 26 04/23/2018   TSH 0.42 09/06/2017   INR 0.98 11/27/2009   HGBA1C 5.3 09/06/2017       Assessment & Plan:

## 2018-04-23 NOTE — Assessment & Plan Note (Signed)
For cpk and labs as ordered,  to f/u any worsening symptoms or concerns

## 2018-04-23 NOTE — Assessment & Plan Note (Signed)
Improved with some persistent residual discomfort, ok to follow

## 2018-04-23 NOTE — Patient Instructions (Signed)
Please take all new medication as prescribed - the gabapentin  You are given the work note  Please continue all other medications as before, and refills have been done if requested.  Please have the pharmacy call with any other refills you may need.  Please keep your appointments with your specialists as you may have planned  Please go to the LAB in the Basement (turn left off the elevator) for the tests to be done today  You will be contacted by phone if any changes need to be made immediately.  Otherwise, you will receive a letter about your results with an explanation, but please check with MyChart first.  Please remember to sign up for MyChart if you have not done so, as this will be important to you in the future with finding out test results, communicating by private email, and scheduling acute appointments online when needed.

## 2018-04-25 ENCOUNTER — Telehealth: Payer: Self-pay

## 2018-04-25 NOTE — Telephone Encounter (Signed)
Noted. Dr. Denice Bors  Copied from [place] [phone]. Topic: General - Other >> Apr 25, 2018  1:21 PM Jeri Cos wrote: Reason for CRM: Pt called wanting to let the nurse for Dr. Jenny Reichmann know that the medication they prescribed is working well.

## 2018-05-07 ENCOUNTER — Ambulatory Visit: Payer: BC Managed Care – PPO | Admitting: Women's Health

## 2018-05-07 ENCOUNTER — Encounter: Payer: Self-pay | Admitting: Women's Health

## 2018-05-07 VITALS — BP 126/80

## 2018-05-07 DIAGNOSIS — R350 Frequency of micturition: Secondary | ICD-10-CM | POA: Diagnosis not present

## 2018-05-07 DIAGNOSIS — R1030 Lower abdominal pain, unspecified: Secondary | ICD-10-CM

## 2018-05-07 LAB — WET PREP FOR TRICH, YEAST, CLUE

## 2018-05-07 NOTE — Patient Instructions (Signed)
Pelvic Pain, Female  Pelvic pain is pain in your lower belly (abdomen), below your belly button and between your hips. The pain may start suddenly (be acute), keep coming back (be recurring), or last a long time (become chronic). Pelvic pain that lasts longer than 6 months is called chronic pelvic pain. There are many causes of pelvic pain. Sometimes the cause of pelvic pain is not known.  Follow these instructions at home:    · Take over-the-counter and prescription medicines only as told by your doctor.  · Rest as told by your doctor.  · Do not have sex if it hurts.  · Keep a journal of your pelvic pain. Write down:  ? When the pain started.  ? Where the pain is located.  ? What seems to make the pain better or worse, such as food or your period (menstrual cycle).  ? Any symptoms you have along with the pain.  · Keep all follow-up visits as told by your doctor. This is important.  Contact a doctor if:  · Medicine does not help your pain.  · Your pain comes back.  · You have new symptoms.  · You have unusual discharge or bleeding from your vagina.  · You have a fever or chills.  · You are having trouble pooping (constipation).  · You have blood in your pee (urine) or poop (stool).  · Your pee smells bad.  · You feel weak or light-headed.  Get help right away if:  · You have sudden pain that is very bad.  · Your pain keeps getting worse.  · You have very bad pain and also have any of these symptoms:  ? A fever.  ? Feeling sick to your stomach (nausea).  ? Throwing up (vomiting).  ? Being very sweaty.  · You pass out (lose consciousness).  Summary  · Pelvic pain is pain in your lower belly (abdomen), below your belly button and between your hips.  · There are many possible causes of pelvic pain.  · Keep a journal of your pelvic pain.  This information is not intended to replace advice given to you by your health care provider. Make sure you discuss any questions you have with your health care provider.  Document  Released: 08/02/2007 Document Revised: 08/01/2017 Document Reviewed: 08/01/2017  Elsevier Interactive Patient Education © 2019 [employer]

## 2018-05-07 NOTE — Progress Notes (Signed)
58 year old BF G3P3  presents with bilateral abdominal pain that began around 1 month ago. Describes the pain as 8/10 throbbing. Pain does not radiate. Denies vaginal discharge or itching, urinary symptoms other than urgency which is not a change..  Denies nausea, constipation or fever.  Has been having loose stools daily since right ureteral stone Lithotripsy 04/08/2018 .  States pain is different than kidney stone.  Able to work and do her daily activities.  04/06/2018 CT scan showed right proximal ureteral stone with hydronephrosis, uterus, ovaries unremarkable.  Postmenopausal on no HRT/no bleeding/BTL, not sexually active greater than 1 year.  Exam: Appears well.  No CVAT, Abdomen: soft, tender with palpation, guarding present bilaterally, no masses, hernia, or rebound tendernessabdomen soft, obese, nontender , external genitalia no visible discharge, normal cervix with mild atrophic changes.  Bimanual no cervical motion tenderness. Uterus: No masses noted, tenderness bilaterally, left >right UA: Negative nitrites, leukocytes, no blood, no WBCs, no bacteria  Bilateral pelvic pain, unknown source  Plan: Tylenol as needed for pain, schedule  Ultrasound.  Reviewed may be more GI related, continue bland diet.

## 2018-05-08 LAB — URINALYSIS, COMPLETE W/RFL CULTURE
Bacteria, UA: NONE SEEN /HPF
Bilirubin Urine: NEGATIVE
Glucose, UA: NEGATIVE
Hgb urine dipstick: NEGATIVE
Hyaline Cast: NONE SEEN /LPF
Ketones, ur: NEGATIVE
Leukocyte Esterase: NEGATIVE
Nitrites, Initial: NEGATIVE
Protein, ur: NEGATIVE
RBC / HPF: NONE SEEN /HPF (ref 0–2)
Specific Gravity, Urine: 1.01 (ref 1.001–1.03)
WBC, UA: NONE SEEN /HPF (ref 0–5)
pH: 7 (ref 5.0–8.0)

## 2018-05-08 LAB — NO CULTURE INDICATED

## 2018-05-14 ENCOUNTER — Ambulatory Visit: Payer: BC Managed Care – PPO | Admitting: Women's Health

## 2018-05-14 ENCOUNTER — Ambulatory Visit (INDEPENDENT_AMBULATORY_CARE_PROVIDER_SITE_OTHER): Payer: BC Managed Care – PPO

## 2018-05-14 ENCOUNTER — Ambulatory Visit: Payer: BC Managed Care – PPO | Admitting: Internal Medicine

## 2018-05-14 ENCOUNTER — Encounter: Payer: Self-pay | Admitting: Women's Health

## 2018-05-14 ENCOUNTER — Other Ambulatory Visit: Payer: Self-pay

## 2018-05-14 ENCOUNTER — Encounter: Payer: Self-pay | Admitting: Internal Medicine

## 2018-05-14 VITALS — BP 120/86 | HR 85 | Temp 97.9°F | Ht 62.0 in | Wt 156.0 lb

## 2018-05-14 DIAGNOSIS — J309 Allergic rhinitis, unspecified: Secondary | ICD-10-CM

## 2018-05-14 DIAGNOSIS — J45909 Unspecified asthma, uncomplicated: Secondary | ICD-10-CM | POA: Diagnosis not present

## 2018-05-14 DIAGNOSIS — R739 Hyperglycemia, unspecified: Secondary | ICD-10-CM

## 2018-05-14 DIAGNOSIS — M5410 Radiculopathy, site unspecified: Secondary | ICD-10-CM

## 2018-05-14 DIAGNOSIS — I10 Essential (primary) hypertension: Secondary | ICD-10-CM | POA: Diagnosis not present

## 2018-05-14 DIAGNOSIS — R1030 Lower abdominal pain, unspecified: Secondary | ICD-10-CM | POA: Diagnosis not present

## 2018-05-14 MED ORDER — ALBUTEROL SULFATE HFA 108 (90 BASE) MCG/ACT IN AERS: 2.0000 | INHALATION_SPRAY | Freq: Four times a day (QID) | RESPIRATORY_TRACT | 11 refills | Status: DC | PRN

## 2018-05-14 MED ORDER — PREDNISONE 10 MG PO TABS: ORAL_TABLET | ORAL | 0 refills | Status: DC

## 2018-05-14 MED ORDER — METHYLPREDNISOLONE ACETATE 80 MG/ML IJ SUSP
80.0000 mg | Freq: Once | INTRAMUSCULAR | Status: AC
  Administered 2018-05-14: 80 mg via INTRAMUSCULAR

## 2018-05-14 NOTE — Assessment & Plan Note (Addendum)
[place] for off work x 2 wks, but should see Sports Med in this office if not improving, gave work note

## 2018-05-14 NOTE — Assessment & Plan Note (Signed)
Also to improve with steroid tx as above, cont inhaler prn

## 2018-05-14 NOTE — Progress Notes (Signed)
Subjective:    Patient ID: [name] Walker, female    DOB: July 31, 1960, 58 y.o.   MRN: [id number]  HPI  Here to f/u; Does have several wks ongoing nasal allergy symptoms with clearish congestion, itch and sneezing, without fever, pain, ST, cough, swelling or wheezing, but only as long as she has the occasional inhaler use about twice per week. 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  Pain as per last visit persists, without overall improvement.  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 Past Medical History:  Diagnosis Date  . ACHILLES TENDINITIS   . ALLERGIC RHINITIS   . Allergy   . Anginal pain ([place]) 04/04/2016   Pt currently feels like she is having muscle spasms and aching in her chest  . Anxiety   . Asthma   . Chronic kidney disease   . GERD   . History of kidney stones   . HYPERTENSION   . LATERAL EPICONDYLITIS, RIGHT   . LOW BACK PAIN   . Plantar fascial fibromatosis   . SUBACROMIAL BURSITIS, RIGHT   . Thoracic scoliosis 10/01/2015   Past Surgical History:  Procedure Laterality Date  . ANTERIOR CERVICAL DECOMP/DISCECTOMY FUSION N/A 04/07/2016   Procedure: Cervical two-three Anterior cervical decompression/discectomy/fusion;  Surgeon: Ashok Pall, MD;  Location: [facility];  Service: Neurosurgery;  Laterality: N/A;  . BACK SURGERY  2015   lower back  . EXTRACORPOREAL SHOCK WAVE LITHOTRIPSY Left 09/28/2016   Procedure: LEFT EXTRACORPOREAL SHOCK WAVE LITHOTRIPSY (ESWL);  Surgeon: Festus Aloe, MD;  Location: [facility];  Service: Urology;  Laterality: Left;  . EXTRACORPOREAL SHOCK WAVE LITHOTRIPSY Right 04/08/2018   Procedure: EXTRACORPOREAL SHOCK WAVE LITHOTRIPSY (ESWL);  Surgeon: Ardis Hughs, MD;  Location: [facility];  Service: Urology;  Laterality: Right;  . KIDNEY SURGERY    . LITHOTRIPSY   05/2016  . TUBAL LIGATION      reports that she has been smoking cigarettes. She has a 10.00 pack-year smoking history. She has never used smokeless tobacco. She reports that she does not drink alcohol or use drugs. family history includes Breast cancer in her maternal aunt; Cancer in her father and sister; Colon cancer in her father and sister; Diabetes in her mother. Allergies  Allergen Reactions  . Flexeril [Cyclobenzaprine] Swelling    Tongue swells  . Lisinopril Other (See Comments)    ? Possible tongue swelling   . Other Palpitations    ALL MUSCLE RELAXERS-PER PATIENT  . Robaxin [Methocarbamol] Other (See Comments)    Tongue swelling  . Contrast Media [Iodinated Diagnostic Agents] Itching   Current Outpatient Medications on File Prior to Visit  Medication Sig Dispense Refill  . acetaminophen (TYLENOL) 500 MG tablet Take 1,000 mg by mouth every 6 (six) hours as needed for mild pain.    Marland Kitchen albuterol (PROVENTIL HFA;VENTOLIN HFA) 108 (90 Base) MCG/ACT inhaler Inhale 2 puffs into the lungs every 6 (six) hours as needed for wheezing or shortness of breath. 1 Inhaler 11  . amLODipine (NORVASC) 5 MG tablet Take 1 tablet (5 mg total) by mouth daily. 90 tablet 3  . cetirizine (ZYRTEC) 10 MG tablet Take 1 tablet (10 mg total) by mouth daily. 30 tablet 11  . fluticasone (FLONASE) 50 MCG/ACT nasal spray Place 1 spray into both nostrils daily. 16 g 2  .  gabapentin (NEURONTIN) 100 MG capsule Take 1 capsule (100 mg total) by mouth 3 (three) times daily. 90 capsule 3  . hydrochlorothiazide (HYDRODIURIL) 25 MG tablet Take 1 tablet (25 mg total) by mouth daily. 90 tablet 3  . ibuprofen (ADVIL,MOTRIN) 800 MG tablet Take 800 mg by mouth every 8 (eight) hours as needed for moderate pain.     Marland Kitchen ondansetron (ZOFRAN ODT) 4 MG disintegrating tablet Take 1 tablet (4 mg total) by mouth every 8 (eight) hours as needed for nausea or vomiting. 12 tablet 0  . pantoprazole (PROTONIX) 40 MG tablet Take 1 tablet (40  mg total) by mouth 2 (two) times daily. 60 tablet 11  . tamsulosin (FLOMAX) 0.4 MG CAPS capsule Take 0.4 mg by mouth daily.     No current facility-administered medications on file prior to visit.    Review of Systems  Constitutional: Negative for other unusual diaphoresis or sweats HENT: Negative for ear discharge or swelling Eyes: Negative for other worsening visual disturbances Respiratory: Negative for stridor or other swelling  Gastrointestinal: Negative for worsening distension or other blood Genitourinary: Negative for retention or other urinary change Musculoskeletal: Negative for other MSK pain or swelling Skin: Negative for color change or other new lesions Neurological: Negative for worsening tremors and other numbness  Psychiatric/Behavioral: Negative for worsening agitation or other fatigue All other system neg per pt    Objective:   Physical Exam BP 120/86   Pulse 85   Temp 97.9 F (36.6 C) (Oral)   Ht 5\' 2"  (1.575 m)   Wt 156 lb (70.8 kg)   LMP 08/22/2010   SpO2 95%   BMI 28.53 kg/m  VS noted,  Constitutional: Pt appears in NAD HENT: Head: NCAT.  Right Ear: External ear normal.  Left Ear: External ear normal Bilat tm's with mild erythema.  Max sinus areas non tender.  Pharynx with mild erythema, no exudate.  Eyes: . Pupils are equal, round, and reactive to light. Conjunctivae and EOM are normal Nose: without d/c or deformity Neck: Neck supple. Gross normal ROM Cardiovascular: Normal rate and regular rhythm.   Pulmonary/Chest: Effort normal and breath sounds without rales or wheezing.  Abd:  Soft, NT, ND, + BS, no organomegaly Spine nontender in the low midline Neurological: Pt is alert. At baseline orientation, motor grossly intact Skin: Skin is warm. No rashes, other new lesions, no LE edema Psychiatric: Pt behavior is normal without agitation  No other exam findings Lab Results  Component Value Date   WBC 4.9 04/23/2018   HGB 14.5 04/23/2018   HCT  44.5 04/23/2018   PLT 145.0 (L) 04/23/2018   GLUCOSE 96 04/23/2018   CHOL 189 09/06/2017   TRIG 316.0 (H) 09/06/2017   HDL 41.40 09/06/2017   LDLDIRECT 117.0 09/06/2017   LDLCALC 85 07/02/2012   ALT 24 04/23/2018   AST 21 04/23/2018   NA 142 04/23/2018   K 3.8 04/23/2018   CL 108 04/23/2018   CREATININE 0.64 04/23/2018   BUN 13 04/23/2018   CO2 26 04/23/2018   TSH 0.42 09/06/2017   INR 0.98 11/27/2009   HGBA1C 5.3 09/06/2017       Assessment & Plan:

## 2018-05-14 NOTE — Assessment & Plan Note (Signed)
stable overall by history and exam, recent data reviewed with pt, and pt to continue medical treatment as before,  to f/u any worsening symptoms or concerns  

## 2018-05-14 NOTE — Assessment & Plan Note (Signed)
Mild to mod, for depomedrol IM 80, predpac asd, to f/u any worsening symptoms or concerns 
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
## 2018-05-14 NOTE — Addendum Note (Signed)
Addended by: Juliet Rude on: 05/14/2018 02:39 PM   Modules accepted: Orders

## 2018-05-14 NOTE — Patient Instructions (Signed)
You had the steroid shot today  Please take all new medication as prescribed - the prednisone  You are given the work note  Please continue all other medications as before, including the gabapentin while off work, and refills have been done for the inhaler  Please have the pharmacy call with any other refills you may need.  Please continue your efforts at being more active, low cholesterol diet, and weight control.  Please keep your appointments with your specialists as you may have planned  Please see Sports Medicine in this office for persistent back pain if not improving

## 2018-05-22 ENCOUNTER — Telehealth: Payer: Self-pay | Admitting: Internal Medicine

## 2018-05-22 NOTE — Telephone Encounter (Signed)
Patient has dropped off FMLA to be completed for 05/14/18 to 05/28/18 - Return to work on 05/29/18.   Forms have been completed & placed in providers box to review and sign.

## 2018-05-22 NOTE — Telephone Encounter (Signed)
Copied from [place] [id number]. Topic: General - Other >> May 22, 2018  4:32 PM Gustavus Messing wrote: Reason for CRM: Patient was recently seen by the Dr. And she stated that she is still having symptoms from her allergies and she would like to know what to do because OTC medications are not working

## 2018-05-23 DIAGNOSIS — Z0279 Encounter for issue of other medical certificate: Secondary | ICD-10-CM

## 2018-05-23 MED ORDER — AZELASTINE-FLUTICASONE 137-50 MCG/ACT NA SUSP: 1.0000 | Freq: Two times a day (BID) | NASAL | 11 refills | Status: DC

## 2018-05-23 NOTE — Addendum Note (Signed)
Addended by: Biagio Borg on: 05/23/2018 12:22 PM   Modules accepted: Orders

## 2018-05-23 NOTE — Telephone Encounter (Signed)
Forms have been signed, Faxed to [employer], Copy sent to scan & charged for.   Patient informed and does not request a copy at this time.

## 2018-05-23 NOTE — Telephone Encounter (Signed)
Ok, I have sent dysmista to try instead of the flonase  If she wants, we can send to [facility] if that is still her mailin pharmacy  Otherwise, I could refer to allergy if she wants

## 2018-05-24 NOTE — Telephone Encounter (Signed)
Pt has been informed med was sent to pharmacy. She stated if med doesn't work she will call back to proceed with allergy referral.

## 2018-06-02 ENCOUNTER — Encounter (HOSPITAL_COMMUNITY): Payer: Self-pay | Admitting: *Deleted

## 2018-06-02 ENCOUNTER — Emergency Department (HOSPITAL_COMMUNITY)
Admission: EM | Admit: 2018-06-02 | Discharge: 2018-06-02 | Disposition: A | Discharge status: Home / Self Care | Payer: BC Managed Care – PPO | Attending: Emergency Medicine | Admitting: Emergency Medicine

## 2018-06-02 ENCOUNTER — Other Ambulatory Visit: Payer: Self-pay

## 2018-06-02 DIAGNOSIS — Z79899 Other long term (current) drug therapy: Secondary | ICD-10-CM | POA: Diagnosis not present

## 2018-06-02 DIAGNOSIS — Y92410 Unspecified street and highway as the place of occurrence of the external cause: Secondary | ICD-10-CM | POA: Diagnosis not present

## 2018-06-02 DIAGNOSIS — J45909 Unspecified asthma, uncomplicated: Secondary | ICD-10-CM | POA: Diagnosis not present

## 2018-06-02 DIAGNOSIS — I10 Essential (primary) hypertension: Secondary | ICD-10-CM | POA: Diagnosis not present

## 2018-06-02 DIAGNOSIS — Y998 Other external cause status: Secondary | ICD-10-CM | POA: Diagnosis not present

## 2018-06-02 DIAGNOSIS — Y939 Activity, unspecified: Secondary | ICD-10-CM | POA: Insufficient documentation

## 2018-06-02 DIAGNOSIS — T148XXA Other injury of unspecified body region, initial encounter: Secondary | ICD-10-CM

## 2018-06-02 DIAGNOSIS — F17210 Nicotine dependence, cigarettes, uncomplicated: Secondary | ICD-10-CM | POA: Diagnosis not present

## 2018-06-02 DIAGNOSIS — S3992XA Unspecified injury of lower back, initial encounter: Secondary | ICD-10-CM | POA: Diagnosis present

## 2018-06-02 DIAGNOSIS — S76011A Strain of muscle, fascia and tendon of right hip, initial encounter: Secondary | ICD-10-CM | POA: Insufficient documentation

## 2018-06-02 MED ORDER — OXYCODONE-ACETAMINOPHEN 5-325 MG PO TABS
1.0000 | ORAL_TABLET | Freq: Once | ORAL | Status: AC
  Administered 2018-06-02: 1 via ORAL
  Filled 2018-06-02: qty 1

## 2018-06-02 MED ORDER — LORAZEPAM 0.5 MG PO TABS
0.5000 mg | ORAL_TABLET | Freq: Once | ORAL | Status: AC
  Administered 2018-06-02: 0.5 mg via ORAL
  Filled 2018-06-02: qty 1

## 2018-06-02 MED ORDER — IBUPROFEN 400 MG PO TABS
400.0000 mg | ORAL_TABLET | Freq: Once | ORAL | Status: AC
  Administered 2018-06-02: 400 mg via ORAL
  Filled 2018-06-02: qty 1

## 2018-06-02 MED ORDER — MELOXICAM 15 MG PO TABS: 7.5000 mg | ORAL_TABLET | Freq: Every day | ORAL | 0 refills | Status: DC | PRN
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
## 2018-06-02 NOTE — ED Notes (Signed)
Patient verbalizes understanding of discharge instructions . Opportunity for questions and answers were provided . Armband removed by staff ,Pt discharged from ED. W/C  offered at D/C  and Declined W/C at D/C and was escorted to lobby by RN.  
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
## 2018-06-03 ENCOUNTER — Ambulatory Visit (INDEPENDENT_AMBULATORY_CARE_PROVIDER_SITE_OTHER): Payer: BC Managed Care – PPO | Admitting: Internal Medicine

## 2018-06-03 ENCOUNTER — Encounter: Payer: Self-pay | Admitting: Internal Medicine

## 2018-06-03 ENCOUNTER — Telehealth: Payer: Self-pay | Admitting: Internal Medicine

## 2018-06-03 DIAGNOSIS — M545 Low back pain, unspecified: Secondary | ICD-10-CM

## 2018-06-03 DIAGNOSIS — I10 Essential (primary) hypertension: Secondary | ICD-10-CM

## 2018-06-03 DIAGNOSIS — M5410 Radiculopathy, site unspecified: Secondary | ICD-10-CM

## 2018-06-03 DIAGNOSIS — R739 Hyperglycemia, unspecified: Secondary | ICD-10-CM

## 2018-06-03 MED ORDER — GABAPENTIN 100 MG PO CAPS: 200.0000 mg | ORAL_CAPSULE | Freq: Three times a day (TID) | ORAL | 11 refills | Status: DC

## 2018-06-03 MED ORDER — LORAZEPAM 0.5 MG PO TABS: 0.5000 mg | ORAL_TABLET | Freq: Two times a day (BID) | ORAL | 1 refills | Status: DC | PRN

## 2018-06-03 MED ORDER — OXYCODONE-ACETAMINOPHEN 5-325 MG PO TABS: 1.0000 | ORAL_TABLET | Freq: Two times a day (BID) | ORAL | 0 refills | Status: AC | PRN

## 2018-06-03 MED ORDER — CYCLOBENZAPRINE HCL 5 MG PO TABS: 5.0000 mg | ORAL_TABLET | Freq: Three times a day (TID) | ORAL | 2 refills | Status: DC | PRN

## 2018-06-03 NOTE — Assessment & Plan Note (Signed)
Ok to increase the gabapentin to 200 tid - done erx
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
## 2018-06-03 NOTE — Assessment & Plan Note (Signed)
stable overall by history and exam, recent data reviewed with pt, and pt to continue medical treatment as before,  to f/u any worsening symptoms or concerns  
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
## 2018-06-03 NOTE — Telephone Encounter (Signed)
error 

## 2018-06-03 NOTE — Patient Instructions (Signed)
See above

## 2018-06-08 ENCOUNTER — Encounter: Payer: Self-pay | Admitting: Family Medicine

## 2018-06-08 ENCOUNTER — Ambulatory Visit (INDEPENDENT_AMBULATORY_CARE_PROVIDER_SITE_OTHER): Payer: BC Managed Care – PPO | Admitting: Family Medicine

## 2018-06-08 DIAGNOSIS — M5416 Radiculopathy, lumbar region: Secondary | ICD-10-CM | POA: Diagnosis not present

## 2018-06-08 DIAGNOSIS — J309 Allergic rhinitis, unspecified: Secondary | ICD-10-CM | POA: Diagnosis not present

## 2018-06-08 MED ORDER — NAPROXEN 500 MG PO TABS: 500.0000 mg | ORAL_TABLET | Freq: Two times a day (BID) | ORAL | 0 refills | Status: DC

## 2018-06-08 MED ORDER — AZELASTINE-FLUTICASONE 137-50 MCG/ACT NA SUSP: 1.0000 | Freq: Two times a day (BID) | NASAL | 11 refills | Status: DC

## 2018-06-08 NOTE — Assessment & Plan Note (Signed)
-  dymista renewed -Restart zyrtec
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
## 2018-06-08 NOTE — Assessment & Plan Note (Addendum)
-  Discussed limiting use of sedating medications including ativan and flexeril with pain medication. -She may continue gabapentin. -Will add on Naproxen 500mg  bid -No red flags but may need imaging if not improving, will defer to PCP given somewhat limited nature of this virtual visit.

## 2018-06-10 ENCOUNTER — Telehealth: Payer: Self-pay | Admitting: Internal Medicine

## 2018-06-10 DIAGNOSIS — M5416 Radiculopathy, lumbar region: Secondary | ICD-10-CM

## 2018-06-10 MED ORDER — GABAPENTIN 300 MG PO CAPS: 300.0000 mg | ORAL_CAPSULE | Freq: Three times a day (TID) | ORAL | 3 refills | Status: DC

## 2018-06-10 MED ORDER — PREDNISONE 10 MG PO TABS: ORAL_TABLET | ORAL | 0 refills | Status: DC

## 2018-06-10 NOTE — Telephone Encounter (Signed)
Please advise 
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
## 2018-06-10 NOTE — Telephone Encounter (Signed)
Ok to increase the gabapentin to 300 tid, and add short course of prednisone as directed - done erx

## 2018-06-10 NOTE — Addendum Note (Signed)
Addended by: Biagio Borg on: 06/10/2018 03:42 PM   Modules accepted: Orders

## 2018-06-11 NOTE — Telephone Encounter (Addendum)
Unable to do complete exam by phone or virtual visit  Per pt report, she has persistent low back pain with bilat leg pain and more recently weakness  Regular xrays do not show disc problem well  OK for MRI LS Spine - I will order  Please let pt know that due to the pandemic, the actual MRI will most likely be delayed to be scheduled, as they are only doing the MRI in the case of possible cancer cases or infection or other urgent need  Please continue pain medication as prescribed

## 2018-06-11 NOTE — Telephone Encounter (Signed)
Pt has been informed and expressed understanding.  

## 2018-06-11 NOTE — Addendum Note (Signed)
Addended by: Biagio Borg on: 06/11/2018 11:30 AM   Modules accepted: Orders

## 2018-06-11 NOTE — Telephone Encounter (Signed)
Pt has been informed and expressed understanding.   She asked if she could have an xray done of her back. She stated that the last time something like this happened it was due to a disc in her back. She is worried that it might be another one. I have informed her that a video visit may be needed and she expressed understanding. I will follow up with the pt after being advised from PCP.

## 2018-06-17 ENCOUNTER — Telehealth: Payer: Self-pay | Admitting: *Deleted
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
## 2018-07-16 ENCOUNTER — Telehealth: Payer: Self-pay | Admitting: Internal Medicine

## 2018-07-16 MED ORDER — OXYCODONE-ACETAMINOPHEN 5-325 MG PO TABS: 1.0000 | ORAL_TABLET | ORAL | 0 refills | Status: AC | PRN

## 2018-07-16 NOTE — Telephone Encounter (Signed)
Done erx 

## 2018-07-16 NOTE — Telephone Encounter (Signed)
Copied from [place] [id number]. Topic: Quick Communication - Rx Refill/Question >> Jul 16, 2018 12:22 PM Mathis Bud wrote: Medication: oxyCODONE-acetaminophen (PERCOCET/ROXICET) 5-325 MG tablet   Back/shoulder is hurting bad  Has the patient contacted their pharmacy? No more refills.   Preferred Pharmacy (with phone number or street name): [facility], [place] [phone] (Phone) [phone] (Fax)    Agent: Please be advised that RX refills may take up to 3 business days. We ask that you follow-up with your pharmacy.

## 2018-07-20 ENCOUNTER — Other Ambulatory Visit: Payer: Self-pay | Admitting: Internal Medicine

## 2018-07-20 DIAGNOSIS — K219 Gastro-esophageal reflux disease without esophagitis: Secondary | ICD-10-CM

## 2018-08-02 ENCOUNTER — Other Ambulatory Visit: Payer: Self-pay

## 2018-08-02 ENCOUNTER — Ambulatory Visit
Admission: RE | Admit: 2018-08-02 | Discharge: 2018-08-02 | Disposition: A | Discharge status: Home / Self Care | Payer: BC Managed Care – PPO | Source: Ambulatory Visit | Attending: Internal Medicine | Admitting: Internal Medicine

## 2018-08-02 DIAGNOSIS — M5416 Radiculopathy, lumbar region: Secondary | ICD-10-CM

## 2018-08-04 ENCOUNTER — Other Ambulatory Visit: Payer: Self-pay | Admitting: Internal Medicine

## 2018-08-04 DIAGNOSIS — M47816 Spondylosis without myelopathy or radiculopathy, lumbar region: Secondary | ICD-10-CM

## 2018-08-12 ENCOUNTER — Telehealth: Payer: Self-pay | Admitting: Internal Medicine

## 2018-08-12 DIAGNOSIS — M545 Low back pain, unspecified: Secondary | ICD-10-CM

## 2018-08-12 DIAGNOSIS — M542 Cervicalgia: Secondary | ICD-10-CM

## 2018-08-12 MED ORDER — MELOXICAM 15 MG PO TABS: 15.0000 mg | ORAL_TABLET | Freq: Every day | ORAL | 3 refills | Status: DC | PRN

## 2018-08-12 NOTE — Telephone Encounter (Signed)
Pt informed of below and verbalized understanding.  

## 2018-08-12 NOTE — Telephone Encounter (Signed)
Ok to let pt know, mobic 15 mg daily prn has been sent for arthritic pain to her pharmacy (and to not take other such as ibuprofen, naproxyn)

## 2018-08-12 NOTE — Telephone Encounter (Signed)
Pt requesting something to help with neck/back pain. Pt reported MRI done 6/5. Pt noted she had prednisone in April but not sure if it helped a lot.  Please call into pharmacy.  [facility]  Pt also would like appt with Dr. Tamala Julian to be set up. She has not received a call.

## 2018-08-12 NOTE — Telephone Encounter (Signed)
Ok to take the mobic prn already sent today  Please remind pt her MRI was only of the lumbar area, not the neck  I will refer to Dr Tamala Julian - thanks

## 2018-08-20 ENCOUNTER — Other Ambulatory Visit: Payer: Self-pay | Admitting: Internal Medicine

## 2018-08-23 ENCOUNTER — Other Ambulatory Visit: Payer: Self-pay

## 2018-08-23 ENCOUNTER — Telehealth: Payer: Self-pay | Admitting: Emergency Medicine

## 2018-08-23 ENCOUNTER — Encounter: Payer: Self-pay | Admitting: Family Medicine

## 2018-08-23 ENCOUNTER — Ambulatory Visit (INDEPENDENT_AMBULATORY_CARE_PROVIDER_SITE_OTHER): Payer: BC Managed Care – PPO | Admitting: Family Medicine

## 2018-08-23 VITALS — BP 130/94 | HR 81 | Resp 16 | Wt 161.0 lb

## 2018-08-23 DIAGNOSIS — G8929 Other chronic pain: Secondary | ICD-10-CM

## 2018-08-23 DIAGNOSIS — M545 Low back pain, unspecified: Secondary | ICD-10-CM

## 2018-08-23 NOTE — Patient Instructions (Signed)
Nice to meet you You will get a call for the injection  You will get a call for physical therapy  Please try the exercises  Please try heat on the area  Please send me a message in [place] with any questions or updates.  Please see me back in 4 weeks.   --Dr. Raeford Razor

## 2018-08-23 NOTE — Telephone Encounter (Signed)
Pt saw Dr Raeford Razor today and stated at check-in she felt like at times the gabapentin was causing her throat to feel like it was closing. This is not constant. Denies SOB or difficulty swallowing. Pt was not in nay distress at visit. She would like to know if there is something else that she can take for her back and leg pain.

## 2018-08-23 NOTE — Telephone Encounter (Signed)
Patient stated she thinks she is allergic to the gabapentin (NEURONTIN) 300 MG capsule. She stated she was having a sore throat and felt like her throat was closing up. Wants to know if she can be prescribed something else instead. Thanks.
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
## 2018-08-26 MED ORDER — TRAMADOL HCL 50 MG PO TABS: 50.0000 mg | ORAL_TABLET | Freq: Three times a day (TID) | ORAL | 0 refills | Status: AC | PRN

## 2018-08-26 NOTE — Telephone Encounter (Signed)
Pt has been informed and expressed understanding.  

## 2018-08-26 NOTE — Addendum Note (Signed)
Addended by: Biagio Borg on: 08/26/2018 09:09 AM   Modules accepted: Orders

## 2018-08-26 NOTE — Assessment & Plan Note (Signed)
Pain possible to be an exacerbation of the facet joint. MRI unrevealing of nerve impingement.   - referral to PT - counseled on supportive care - facet injection  - if no improvement consider epidural.

## 2018-08-26 NOTE — Telephone Encounter (Addendum)
Ok to stop the gabapentin though it is not clear the gabapentin is actually causing her symptoms  OK for benadryl 50 mg every 6 hrs as needed for worsening throat symptoms  For tramadol 1 mo only, as she was advised to f/u with Dr Raeford Razor in 4 wks

## 2018-09-09 ENCOUNTER — Other Ambulatory Visit: Payer: Self-pay | Admitting: Internal Medicine

## 2018-09-09 DIAGNOSIS — K219 Gastro-esophageal reflux disease without esophagitis: Secondary | ICD-10-CM

## 2018-09-10 ENCOUNTER — Encounter: Payer: Self-pay | Admitting: Internal Medicine

## 2018-09-10 ENCOUNTER — Ambulatory Visit (INDEPENDENT_AMBULATORY_CARE_PROVIDER_SITE_OTHER): Payer: BC Managed Care – PPO | Admitting: Internal Medicine

## 2018-09-10 DIAGNOSIS — R739 Hyperglycemia, unspecified: Secondary | ICD-10-CM

## 2018-09-10 DIAGNOSIS — M545 Low back pain, unspecified: Secondary | ICD-10-CM

## 2018-09-10 DIAGNOSIS — M542 Cervicalgia: Secondary | ICD-10-CM | POA: Diagnosis not present

## 2018-09-10 DIAGNOSIS — M7989 Other specified soft tissue disorders: Secondary | ICD-10-CM | POA: Diagnosis not present

## 2018-09-10 MED ORDER — PREDNISONE 10 MG PO TABS: ORAL_TABLET | ORAL | 0 refills | Status: DC

## 2018-09-10 MED ORDER — CYCLOBENZAPRINE HCL 5 MG PO TABS: 5.0000 mg | ORAL_TABLET | Freq: Three times a day (TID) | ORAL | 2 refills | Status: DC | PRN

## 2018-09-10 MED ORDER — HYDROCODONE-ACETAMINOPHEN 5-325 MG PO TABS: 1.0000 | ORAL_TABLET | Freq: Four times a day (QID) | ORAL | 0 refills | Status: DC | PRN

## 2018-09-10 NOTE — Assessment & Plan Note (Signed)
For f/u [facility] tomorrow
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
## 2018-09-10 NOTE — Assessment & Plan Note (Signed)
C/w angioedema, mild , for prednisone asd,  to f/u any worsening symptoms or concerns
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
## 2018-09-10 NOTE — Patient Instructions (Signed)
Please take all new medication as prescribed - the pain medication, muscle relaxer and prednisone  Please continue all other medications as before, and refills have been done if requested.  Please have the pharmacy call with any other refills you may need.  Please keep your appointments with your specialists as you may have planned   

## 2018-09-10 NOTE — Assessment & Plan Note (Signed)
stable overall by history and exam, recent data reviewed with pt, and pt to continue medical treatment as before,  to f/u any worsening symptoms or concerns  

## 2018-09-11 ENCOUNTER — Ambulatory Visit
Admission: RE | Admit: 2018-09-11 | Discharge: 2018-09-11 | Disposition: A | Discharge status: Home / Self Care | Payer: BC Managed Care – PPO | Source: Ambulatory Visit | Attending: Family Medicine | Admitting: Family Medicine

## 2018-09-11 ENCOUNTER — Other Ambulatory Visit: Payer: Self-pay

## 2018-09-11 ENCOUNTER — Other Ambulatory Visit: Payer: Self-pay | Admitting: Internal Medicine

## 2018-09-11 MED ORDER — IOPAMIDOL (ISOVUE-M 200) INJECTION 41%
1.0000 mL | Freq: Once | INTRAMUSCULAR | Status: AC
  Administered 2018-09-11: 1 mL via INTRA_ARTICULAR

## 2018-09-11 MED ORDER — METHYLPREDNISOLONE ACETATE 40 MG/ML INJ SUSP (RADIOLOG
120.0000 mg | Freq: Once | INTRAMUSCULAR | Status: AC
  Administered 2018-09-11: 120 mg via INTRA_ARTICULAR

## 2018-09-11 NOTE — Discharge Instructions (Signed)

## 2018-09-11 NOTE — Telephone Encounter (Signed)
Done erx 

## 2018-09-12 ENCOUNTER — Other Ambulatory Visit: Payer: Self-pay | Admitting: Internal Medicine

## 2018-09-12 DIAGNOSIS — Z1231 Encounter for screening mammogram for malignant neoplasm of breast: Secondary | ICD-10-CM

## 2018-09-25 ENCOUNTER — Telehealth: Payer: Self-pay

## 2018-09-25 DIAGNOSIS — M545 Low back pain, unspecified: Secondary | ICD-10-CM

## 2018-09-25 NOTE — Addendum Note (Signed)
Addended by: Biagio Borg on: 09/25/2018 12:44 PM   Modules accepted: Orders

## 2018-09-25 NOTE — Telephone Encounter (Signed)
Copied from [place] [id number]. Topic: General - Other >> Sep 25, 2018 10:04 AM Rainey Pines A wrote: Patient called to inform Dr .Kristeen Mans that shot she received in back worked for 2 days and now patients back and leg are still bothering her. Patient would like a callback from Dr. Jenny Reichmann nurse .

## 2018-09-25 NOTE — Telephone Encounter (Signed)
I dont have any other to offer except a referral to Dr Tamala Julian [facility] - I will do
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
## 2018-09-30 ENCOUNTER — Ambulatory Visit (INDEPENDENT_AMBULATORY_CARE_PROVIDER_SITE_OTHER): Payer: BC Managed Care – PPO | Admitting: Family Medicine

## 2018-09-30 ENCOUNTER — Encounter: Payer: Self-pay | Admitting: Family Medicine

## 2018-09-30 ENCOUNTER — Other Ambulatory Visit: Payer: Self-pay

## 2018-09-30 DIAGNOSIS — M5416 Radiculopathy, lumbar region: Secondary | ICD-10-CM | POA: Diagnosis not present

## 2018-09-30 MED ORDER — VENLAFAXINE HCL ER 37.5 MG PO CP24: 37.5000 mg | ORAL_CAPSULE | Freq: Every day | ORAL | 0 refills | Status: DC

## 2018-09-30 NOTE — Patient Instructions (Signed)
Good to see you Tart cherry extract 1200mg  at night Vitamin D 2000 IU daily  See me again in 5-6 weeks if any worse we will order epidural for L5-S1

## 2018-09-30 NOTE — Assessment & Plan Note (Signed)
Lumbar radiculopathy.  Patient has had this for some time.  Seems to be worsening.  Has had surgical intervention at L5-S1.  Patient responded short-term to an L5 right-sided facet injection. Concern more for patient having recurrent injury to the L5-S1 area with the bilateral radicular symptoms.  We discussed gabapentin but patient has no side effect.  Started on Effexor today.  Discussed icing regimen and home exercises.  Which activities to do avoid patient will follow-up again in 4 to 5 weeks.

## 2018-10-01 ENCOUNTER — Other Ambulatory Visit: Payer: Self-pay | Admitting: Physical Therapy

## 2018-10-01 MED ORDER — DULOXETINE HCL 20 MG PO CPEP: 20.0000 mg | ORAL_CAPSULE | Freq: Every day | ORAL | 1 refills | Status: DC

## 2018-10-03 ENCOUNTER — Other Ambulatory Visit: Payer: Self-pay

## 2018-10-03 ENCOUNTER — Encounter: Payer: Self-pay | Admitting: Internal Medicine

## 2018-10-03 ENCOUNTER — Other Ambulatory Visit (INDEPENDENT_AMBULATORY_CARE_PROVIDER_SITE_OTHER): Payer: BC Managed Care – PPO

## 2018-10-03 ENCOUNTER — Ambulatory Visit (INDEPENDENT_AMBULATORY_CARE_PROVIDER_SITE_OTHER): Payer: BC Managed Care – PPO | Admitting: Internal Medicine

## 2018-10-03 VITALS — BP 144/92 | HR 90 | Temp 98.2°F | Ht 60.0 in | Wt 157.0 lb

## 2018-10-03 DIAGNOSIS — F419 Anxiety disorder, unspecified: Secondary | ICD-10-CM

## 2018-10-03 DIAGNOSIS — Z0001 Encounter for general adult medical examination with abnormal findings: Secondary | ICD-10-CM

## 2018-10-03 DIAGNOSIS — I10 Essential (primary) hypertension: Secondary | ICD-10-CM | POA: Diagnosis not present

## 2018-10-03 DIAGNOSIS — M545 Low back pain: Secondary | ICD-10-CM

## 2018-10-03 DIAGNOSIS — E538 Deficiency of other specified B group vitamins: Secondary | ICD-10-CM | POA: Diagnosis not present

## 2018-10-03 DIAGNOSIS — E611 Iron deficiency: Secondary | ICD-10-CM

## 2018-10-03 DIAGNOSIS — G8929 Other chronic pain: Secondary | ICD-10-CM

## 2018-10-03 DIAGNOSIS — R739 Hyperglycemia, unspecified: Secondary | ICD-10-CM

## 2018-10-03 DIAGNOSIS — E559 Vitamin D deficiency, unspecified: Secondary | ICD-10-CM | POA: Diagnosis not present

## 2018-10-03 LAB — VITAMIN D 25 HYDROXY (VIT D DEFICIENCY, FRACTURES): VITD: 25.48 ng/mL — ABNORMAL LOW (ref 30.00–100.00)

## 2018-10-03 LAB — LIPID PANEL
Cholesterol: 195 mg/dL (ref 0–200)
HDL: 39.5 mg/dL (ref >39.00)
LDL Cholesterol: 116 mg/dL — ABNORMAL HIGH (ref 0–99)
NonHDL: 155.81
Total CHOL/HDL Ratio: 5
Triglycerides: 200 mg/dL — ABNORMAL HIGH (ref 0.0–149.0)
VLDL: 40 mg/dL (ref 0.0–40.0)

## 2018-10-03 LAB — BASIC METABOLIC PANEL
BUN: 16 mg/dL (ref 6–23)
CO2: 27 mEq/L (ref 19–32)
Calcium: 10.1 mg/dL (ref 8.4–10.5)
Chloride: 108 mEq/L (ref 96–112)
Creatinine, Ser: 0.72 mg/dL (ref 0.40–1.20)
GFR: 100.4 mL/min (ref >60.00)
Glucose, Bld: 80 mg/dL (ref 70–99)
Potassium: 3.8 mEq/L (ref 3.5–5.1)
Sodium: 142 mEq/L (ref 135–145)

## 2018-10-03 LAB — URINALYSIS, ROUTINE W REFLEX MICROSCOPIC
Bilirubin Urine: NEGATIVE
Ketones, ur: NEGATIVE
Leukocytes,Ua: NEGATIVE
Nitrite: NEGATIVE
Specific Gravity, Urine: 1.02 (ref 1.000–1.030)
Total Protein, Urine: NEGATIVE
Urine Glucose: NEGATIVE
Urobilinogen, UA: 1 (ref 0.0–1.0)
pH: 6 (ref 5.0–8.0)

## 2018-10-03 LAB — HEPATIC FUNCTION PANEL
ALT: 25 U/L (ref 0–35)
AST: 18 U/L (ref 0–37)
Albumin: 4.6 g/dL (ref 3.5–5.2)
Alkaline Phosphatase: 114 U/L (ref 39–117)
Bilirubin, Direct: 0.1 mg/dL (ref 0.0–0.3)
Total Bilirubin: 0.7 mg/dL (ref 0.2–1.2)
Total Protein: 7.6 g/dL (ref 6.0–8.3)

## 2018-10-03 LAB — CBC WITH DIFFERENTIAL/PLATELET
Basophils Absolute: 0.1 10*3/uL (ref 0.0–0.1)
Basophils Relative: 0.8 % (ref 0.0–3.0)
Eosinophils Absolute: 0.1 10*3/uL (ref 0.0–0.7)
Eosinophils Relative: 1.6 % (ref 0.0–5.0)
HCT: 46.5 % — ABNORMAL HIGH (ref 36.0–46.0)
Hemoglobin: 15.1 g/dL — ABNORMAL HIGH (ref 12.0–15.0)
Lymphocytes Relative: 35.7 % (ref 12.0–46.0)
Lymphs Abs: 2.4 10*3/uL (ref 0.7–4.0)
MCHC: 32.5 g/dL (ref 30.0–36.0)
MCV: 90.2 fl (ref 78.0–100.0)
Monocytes Absolute: 0.8 10*3/uL (ref 0.1–1.0)
Monocytes Relative: 12.5 % — ABNORMAL HIGH (ref 3.0–12.0)
Neutro Abs: 3.3 10*3/uL (ref 1.4–7.7)
Neutrophils Relative %: 49.4 % (ref 43.0–77.0)
Platelets: 109 10*3/uL — ABNORMAL LOW (ref 150.0–400.0)
RBC: 5.16 Mil/uL — ABNORMAL HIGH (ref 3.87–5.11)
RDW: 14.5 % (ref 11.5–15.5)
WBC: 6.7 10*3/uL (ref 4.0–10.5)

## 2018-10-03 LAB — VITAMIN B12: Vitamin B-12: 299 pg/mL (ref 211–911)

## 2018-10-03 LAB — IBC PANEL
Iron: 122 ug/dL (ref 42–145)
Saturation Ratios: 33.3 % (ref 20.0–50.0)
Transferrin: 262 mg/dL (ref 212.0–360.0)

## 2018-10-03 LAB — HEMOGLOBIN A1C: Hgb A1c MFr Bld: 5.7 % (ref 4.6–6.5)

## 2018-10-03 LAB — TSH: TSH: 0.76 u[IU]/mL (ref 0.35–4.50)

## 2018-10-03 MED ORDER — LIDOCAINE 5 % EX PTCH: 1.0000 | MEDICATED_PATCH | CUTANEOUS | 5 refills | Status: DC
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
## 2018-10-03 NOTE — Patient Instructions (Signed)
Please take all new medication as prescribed - the lidoderm  Please continue all other medications as before, and refills have been done if requested.  Please have the pharmacy call with any other refills you may need.  Please continue your efforts at being more active, low cholesterol diet, and weight control.  You are otherwise up to date with prevention measures today.  Please keep your appointments with your specialists as you may have planned  Please go to the LAB in the Basement (turn left off the elevator) for the tests to be done today  You will be contacted by phone if any changes need to be made immediately.  Otherwise, you will receive a letter about your results with an explanation, but please check with MyChart first.  Please remember to sign up for MyChart if you have not done so, as this will be important to you in the future with finding out test results, communicating by private email, and scheduling acute appointments online when needed.  Please return in 6 months, or sooner if needed

## 2018-10-06 ENCOUNTER — Other Ambulatory Visit: Payer: Self-pay | Admitting: Internal Medicine

## 2018-10-06 ENCOUNTER — Encounter: Payer: Self-pay | Admitting: Internal Medicine

## 2018-10-06 DIAGNOSIS — F419 Anxiety disorder, unspecified: Secondary | ICD-10-CM | POA: Insufficient documentation

## 2018-10-06 DIAGNOSIS — F418 Other specified anxiety disorders: Secondary | ICD-10-CM | POA: Insufficient documentation

## 2018-10-06 NOTE — Assessment & Plan Note (Signed)
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
## 2018-10-06 NOTE — Assessment & Plan Note (Signed)
stable overall by history and exam, recent data reviewed with pt, and pt to continue medical treatment as before,  to f/u any worsening symptoms or concerns  

## 2018-10-06 NOTE — Assessment & Plan Note (Addendum)
Uncontrolled pain, for add lidoderm asd,  to f/u any worsening symptoms or concerns  In addition to the time spent performing CPE, I spent an additional 15 minutes face to face,in which greater than 50% of this time was spent in counseling and coordination of care for patient's illness as documented, including the differential dx, treatment, further evaluation and other management of lower back pain, anxiety, HTN, hyperglycemia

## 2018-10-07 ENCOUNTER — Telehealth: Payer: Self-pay | Admitting: Internal Medicine

## 2018-10-07 MED ORDER — IBUPROFEN 800 MG PO TABS: 800.0000 mg | ORAL_TABLET | Freq: Three times a day (TID) | ORAL | 0 refills | Status: DC | PRN

## 2018-10-07 NOTE — Telephone Encounter (Signed)
Done erx 

## 2018-10-07 NOTE — Telephone Encounter (Signed)
Patient has dropped off FMLA to be completed. She stated she has not spoke with doctor about taking leave, but he is aware of her back problem from the [place]. She did state Dr.Smith treats her for her back.   She is requesting a 12 week leave from work, until she gets her back straighten out. Please advise.   LOV: 8/06

## 2018-10-07 NOTE — Telephone Encounter (Signed)
Very sorry, but I could not do this unless there was a reasonable chance that this would be the exact amount of time out of work to resolve her problem.  Since we know that there is a previous surgury, and arthritis seen on the MRI, but no other cause for pain, I wouldn't feel comfortable with authorizing 12 weeks off as this will not resolve her situation

## 2018-10-07 NOTE — Addendum Note (Signed)
Addended by: Biagio Borg on: 10/07/2018 12:30 PM   Modules accepted: Orders

## 2018-10-07 NOTE — Telephone Encounter (Signed)
Copied from [place] [id number]. Topic: Quick Communication - Rx Refill/Question >> Oct 07, 2018 10:51 AM Yvette Rack wrote: Medication: ibuprofen (ADVIL,MOTRIN) 800 MG tablet  Has the patient contacted their pharmacy? yes   Preferred Pharmacy (with phone number or street name): [place], [place] [phone] (Phone)  [phone] (Fax)  Agent: Please be advised that RX refills may take up to 3 business days. We ask that you follow-up with your pharmacy.

## 2018-10-08 ENCOUNTER — Encounter: Payer: Self-pay | Admitting: Internal Medicine

## 2018-10-08 ENCOUNTER — Ambulatory Visit (INDEPENDENT_AMBULATORY_CARE_PROVIDER_SITE_OTHER): Payer: BC Managed Care – PPO | Admitting: Internal Medicine

## 2018-10-08 DIAGNOSIS — M5416 Radiculopathy, lumbar region: Secondary | ICD-10-CM | POA: Diagnosis not present

## 2018-10-08 MED ORDER — DICLOFENAC SODIUM 1 % TD GEL: 2.0000 g | Freq: Four times a day (QID) | TRANSDERMAL | 3 refills | Status: DC

## 2018-10-08 NOTE — Patient Instructions (Signed)
Please take all new medication as prescribed - the pain gel as needed  Please continue all other medications as before, and refills have been done if requested.  Please have the pharmacy call with any other refills you may need.  Please keep your appointments with your specialists as you may have planned  OK for FMLA to authorize off work continuous leave from Aug 17 to return to work date of Sept 9 2020

## 2018-10-08 NOTE — Telephone Encounter (Signed)
Spoke with patient and informed her of below. She then stated she had asthma and maybe should be out for that. 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 She is requesting to speak with you about this. She has made a phone call appointment for this evening (8/11).

## 2018-10-08 NOTE — Assessment & Plan Note (Signed)
See notes

## 2018-10-08 NOTE — Progress Notes (Signed)
Patient ID: [name] Walker, female   DOB: 1960-05-08, 58 y.o.   MRN: [id number]  Phone note:  Cumulative time during 7-day interval 13 min, there was not an associated office visit for this concern within a 7 day period.  Verbal consent for services obtained from patient prior to services given.  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  Insurance would not cover lidoderm. Asks for trial vlt gel prn,  to f/u any worsening symptoms or concerns. Denies urinary symptoms such as dysuria, frequency, urgency, flank pain, hematuria or n/v, fever, chills.  Denies worsening reflux, abd pain, dysphagia, n/v, bowel change or blood. Past Medical History:  Diagnosis Date  . ACHILLES TENDINITIS   . ALLERGIC RHINITIS   . Allergy   . Anginal pain ([place]) 04/04/2016   Pt currently feels like she is having muscle spasms and aching in her chest  . Anxiety   . Asthma   . Chronic kidney disease   . GERD   . History of kidney stones   . HYPERTENSION   . LATERAL EPICONDYLITIS, RIGHT   . LOW BACK PAIN   . Plantar fascial fibromatosis   . SUBACROMIAL BURSITIS, RIGHT   . Thoracic scoliosis 10/01/2015   No results found for this or any previous visit (from the past 48 hour(s)). Lab Results  Component Value Date   WBC 6.7 10/03/2018   HGB 15.1 (H) 10/03/2018   HCT 46.5 (H) 10/03/2018   PLT 109.0 (L) 10/03/2018   GLUCOSE 80 10/03/2018   CHOL 195 10/03/2018   TRIG 200.0 (H) 10/03/2018   HDL 39.50 10/03/2018   LDLDIRECT 117.0 09/06/2017   LDLCALC 116 (H) 10/03/2018   ALT 25 10/03/2018   AST 18 10/03/2018   NA 142 10/03/2018   K  3.8 10/03/2018   CL 108 10/03/2018   CREATININE 0.72 10/03/2018   BUN 16 10/03/2018   CO2 27 10/03/2018   TSH 0.76 10/03/2018   INR 0.98 11/27/2009   HGBA1C 5.7 10/03/2018     A/P/next steps:   Right lumbar radiculopathy - ok for volt gel topical prn trial, ok for FMLA to authorize continuous days off Aug 17 to return to work date of sept 9 (has appt sept 8 in f/u with Dr Tamala Julian)  Cathlean Cower MD

## 2018-10-09 DIAGNOSIS — Z0279 Encounter for issue of other medical certificate: Secondary | ICD-10-CM

## 2018-10-09 NOTE — Telephone Encounter (Signed)
Forms have been signed, faxed to [employer], Copy sent to scan &Charged for.   Patient informed, Original mailed for her records.

## 2018-10-09 NOTE — Telephone Encounter (Signed)
Dr. Jenny Reichmann has approved 8/17 to 9/9. Forms have been completed &Placed in providers box to review and sign.

## 2018-10-28 ENCOUNTER — Ambulatory Visit
Admission: RE | Admit: 2018-10-28 | Discharge: 2018-10-28 | Disposition: A | Discharge status: Home / Self Care | Payer: BC Managed Care – PPO | Source: Ambulatory Visit | Attending: Internal Medicine | Admitting: Internal Medicine

## 2018-10-28 ENCOUNTER — Other Ambulatory Visit: Payer: Self-pay

## 2018-10-28 DIAGNOSIS — Z1231 Encounter for screening mammogram for malignant neoplasm of breast: Secondary | ICD-10-CM

## 2018-11-04 NOTE — Progress Notes (Signed)
[name] [address], [place] [address] Phone: [phone] Subjective:   [name] Walker, am serving as a scribe for Dr. Hulan Saas.   CC: Back pain follow-up  [id number]   09/30/2018 Lumbar radiculopathy.  Patient has had this for some time.  Seems to be worsening.  Has had surgical intervention at L5-S1.  Patient responded short-term to an L5 right-sided facet injection. Concern more for patient having recurrent injury to the L5-S1 area with the bilateral radicular symptoms.  We discussed gabapentin but patient has Walker side effect.  Started on Effexor today.  Discussed icing regimen and home exercises.  Which activities to do avoid patient will follow-up again in 4 to 5 weeks.  Update 11/05/2018 [name] Walker is a 58 y.o. female coming in with complaint of back pain. Patient is having radicular symptoms going down both legs today. States that she only had 1 day of relief, 09/11/2018. Is taking Effexor which is helping somewhat. Cannot stand for long periods of time without an increase in her pain.  Patient is having some mild discomfort at all times and then severe pain with certain activities or movements.  Patient is on chronic pain medication from primary care provider and states is not helpful.    Past Medical History:  Diagnosis Date  . ACHILLES TENDINITIS   . ALLERGIC RHINITIS   . Allergy   . Anginal pain ([place]) 04/04/2016   Pt currently feels like she is having muscle spasms and aching in her chest  . Anxiety   . Asthma   . Chronic kidney disease   . GERD   . History of kidney stones   . HYPERTENSION   . LATERAL EPICONDYLITIS, RIGHT   . LOW BACK PAIN   . Plantar fascial fibromatosis   . SUBACROMIAL BURSITIS, RIGHT   . Thoracic scoliosis 10/01/2015   Past Surgical History:  Procedure Laterality Date  . ANTERIOR CERVICAL DECOMP/DISCECTOMY FUSION N/A 04/07/2016   Procedure: Cervical two-three Anterior cervical  decompression/discectomy/fusion;  Surgeon: Ashok Pall, MD;  Location: [facility];  Service: Neurosurgery;  Laterality: N/A;  . BACK SURGERY  2015   lower back  . EXTRACORPOREAL SHOCK WAVE LITHOTRIPSY Left 09/28/2016   Procedure: LEFT EXTRACORPOREAL SHOCK WAVE LITHOTRIPSY (ESWL);  Surgeon: Festus Aloe, MD;  Location: [facility];  Service: Urology;  Laterality: Left;  . EXTRACORPOREAL SHOCK WAVE LITHOTRIPSY Right 04/08/2018   Procedure: EXTRACORPOREAL SHOCK WAVE LITHOTRIPSY (ESWL);  Surgeon: Ardis Hughs, MD;  Location: [facility];  Service: Urology;  Laterality: Right;  . KIDNEY SURGERY    . LITHOTRIPSY  05/2016  . TUBAL LIGATION     Social History   Socioeconomic History  . Marital status: Legally Separated    Spouse name: Not on file  . Number of children: 3  . Years of education: Not on file  . Highest education level: Not on file  Occupational History  . Occupation: Ship broker  school bus driver  Social Needs  . Financial resource strain: Not on file  . Food insecurity    Worry: Not on file    Inability: Not on file  . Transportation needs    Medical: Not on file    Non-medical: Not on file  Tobacco Use  . Smoking status: Current Every Day Smoker    Packs/day: 0.50    Years: 20.00    Pack years: 10.00    Types: Cigarettes  . Smokeless tobacco: Never Used  Substance and Sexual  Activity  . Alcohol use: Walker  . Drug use: Walker  . Sexual activity: Not Currently    Birth control/protection: Post-menopausal, Surgical    Comment: 1st intercourse 58 yo-Fewer than 5 partners-BTL  Lifestyle  . Physical activity    Days per week: Not on file    Minutes per session: Not on file  . Stress: Not on file  Relationships  . Social Herbalist on phone: Not on file    Gets together: Not on file    Attends religious service: Not on file    Active member of club or organization: Not on file    Attends meetings of clubs or organizations: Not on file    Relationship status: Not  on file  Other Topics Concern  . Not on file  Social History Narrative  . Not on file   Allergies  Allergen Reactions  . Flexeril [Cyclobenzaprine] Swelling    Tongue swells  . Lisinopril Other (See Comments)    ? Possible tongue swelling   . Other Palpitations    ALL MUSCLE RELAXERS-PER PATIENT  . Robaxin [Methocarbamol] Other (See Comments)    Tongue swelling  . Contrast Media [Iodinated Diagnostic Agents] Itching  . Gabapentin Other (See Comments)   Family History  Problem Relation Age of Onset  . Cancer Sister        colon cancer  . Colon cancer Sister   . Diabetes Mother   . Cancer Father        Prostate  . Colon cancer Father   . Breast cancer Maternal Aunt        50's  . Esophageal cancer Neg Hx   . Stomach cancer Neg Hx     Current Outpatient Medications (Endocrine & Metabolic):  .  predniSONE (DELTASONE) 10 MG tablet, 2 tabs by mouth per day for 5 days  Current Outpatient Medications (Cardiovascular):  .  amLODipine (NORVASC) 5 MG tablet, Take 1 tablet by mouth once daily .  hydrochlorothiazide (HYDRODIURIL) 25 MG tablet, Take 1 tablet (25 mg total) by mouth daily.  Current Outpatient Medications (Respiratory):  .  albuterol (PROVENTIL HFA;VENTOLIN HFA) 108 (90 Base) MCG/ACT inhaler, Inhale 2 puffs into the lungs every 6 (six) hours as needed for wheezing or shortness of breath. .  Azelastine-Fluticasone (DYMISTA) 137-50 MCG/ACT SUSP, Place 1 spray into the nose 2 (two) times daily. .  fluticasone (FLONASE) 50 MCG/ACT nasal spray, Place 1 spray into both nostrils daily. .  cetirizine (ZYRTEC) 10 MG tablet, Take 1 tablet (10 mg total) by mouth daily.  Current Outpatient Medications (Analgesics):  .  acetaminophen (TYLENOL) 500 MG tablet, Take 1,000 mg by mouth every 6 (six) hours as needed for mild pain. Marland Kitchen  HYDROcodone-acetaminophen (NORCO/VICODIN) 5-325 MG tablet, Take 1 tablet by mouth every 6 (six) hours as needed. Marland Kitchen  ibuprofen (ADVIL) 800 MG tablet, Take  1 tablet (800 mg total) by mouth every 8 (eight) hours as needed. .  meloxicam (MOBIC) 15 MG tablet, Take 1 tablet (15 mg total) by mouth daily as needed for pain.   Current Outpatient Medications (Other):  .  cyclobenzaprine (FLEXERIL) 5 MG tablet, Take 1 tablet (5 mg total) by mouth 3 (three) times daily as needed for muscle spasms. .  diclofenac sodium (VOLTAREN) 1 % GEL, Apply 2 g topically 4 (four) times daily. .  DULoxetine (CYMBALTA) 20 MG capsule, Take 1 capsule (20 mg total) by mouth daily. Marland Kitchen  lidocaine (LIDODERM) 5 %, Place 1 patch onto  the skin daily. Remove & Discard patch within 12 hours or as directed by MD .  LORazepam (ATIVAN) 0.5 MG tablet, Take 1 tablet by mouth twice daily as needed for anxiety .  ondansetron (ZOFRAN ODT) 4 MG disintegrating tablet, Take 1 tablet (4 mg total) by mouth every 8 (eight) hours as needed for nausea or vomiting. .  pantoprazole (PROTONIX) 40 MG tablet, Take 1 tablet by mouth once daily .  tamsulosin (FLOMAX) 0.4 MG CAPS capsule, Take 0.4 mg by mouth daily.    Past medical history, social, surgical and family history all reviewed in electronic medical record.  Walker pertanent information unless stated regarding to the chief complaint.   Review of Systems:  Walker headache, visual changes, nausea, vomiting, diarrhea, constipation, dizziness, abdominal pain, skin rash, fevers, chills, night sweats, weight loss, swollen lymph nodes, body aches, joint swelling,  chest pain, shortness of breath, mood changes.  Positive muscle aches  Objective  Blood pressure (!) 128/98, pulse (!) 107, height 5' (1.524 m), weight 160 lb (72.6 kg), last menstrual period 08/22/2010, SpO2 98 %.    General: Walker apparent distress alert and oriented x3 mood and affect normal, dressed appropriately.  HEENT: Pupils equal, extraocular movements intact  Respiratory: Patient's speak in full sentences and does not appear short of breath  Cardiovascular: Walker lower extremity edema, non  tender, Walker erythema  Skin: Warm dry intact with Walker signs of infection or rash on extremities or on axial skeleton.  Abdomen: Soft nontender  Neuro: Cranial nerves II through XII are intact, neurovascularly intact in all extremities with 2+ DTRs and 2+ pulses.  Lymph: Walker lymphadenopathy of posterior or anterior cervical chain or axillae bilaterally.  Gait normal with good balance and coordination.  MSK:  tender with full range of motion and good stability and symmetric strength and tone of shoulders, elbows, wrist, hip, knee and ankles bilaterally.  Pain is out of proportion to the amount of palpation. Back exam does have significant problem with extension.  Tender to palpation Patient has worsening pain with extension patient is a positive straight leg test bilaterally.  Patient strength in the legs is full Open seems to have some voluntary guarding noted.   Impression and Recommendations:     This case required medical decision making of moderate complexity. The above documentation has been reviewed and is accurate and complete Lyndal Pulley, DO       Note: This dictation was prepared with Dragon dictation along with smaller phrase technology. Any transcriptional errors that result from this process are unintentional.

## 2018-11-05 ENCOUNTER — Encounter: Payer: Self-pay | Admitting: Family Medicine

## 2018-11-05 ENCOUNTER — Ambulatory Visit (INDEPENDENT_AMBULATORY_CARE_PROVIDER_SITE_OTHER): Payer: BC Managed Care – PPO | Admitting: Family Medicine

## 2018-11-05 ENCOUNTER — Other Ambulatory Visit: Payer: Self-pay

## 2018-11-05 ENCOUNTER — Encounter: Payer: Self-pay | Admitting: *Deleted

## 2018-11-05 VITALS — BP 128/98 | HR 107 | Ht 60.0 in | Wt 160.0 lb

## 2018-11-05 DIAGNOSIS — M5416 Radiculopathy, lumbar region: Secondary | ICD-10-CM

## 2018-11-05 MED ORDER — KETOROLAC TROMETHAMINE 60 MG/2ML IM SOLN
60.0000 mg | Freq: Once | INTRAMUSCULAR | Status: AC
  Administered 2018-11-05: 60 mg via INTRAMUSCULAR

## 2018-11-05 NOTE — Assessment & Plan Note (Signed)
Patient does have some lumbar radiculopathy type symptoms.  Has had facet arthropathy and attempted injection with no significant improvement.  More pain going down the legs bilaterally.  Pain does seem to be on proportion but I would like to try an epidural for diagnostic and potentially therapeutic.  Discussed which activities and follow-up again in 4-6 weeks

## 2018-11-05 NOTE — Patient Instructions (Signed)
Epidural L5/S1-[facility] [phone] Toradol given today See me again 3 weeks after epidural injection

## 2018-11-07 ENCOUNTER — Other Ambulatory Visit: Payer: Self-pay | Admitting: Internal Medicine

## 2018-11-15 ENCOUNTER — Other Ambulatory Visit: Payer: Self-pay

## 2018-11-15 ENCOUNTER — Ambulatory Visit
Admission: RE | Admit: 2018-11-15 | Discharge: 2018-11-15 | Disposition: A | Discharge status: Home / Self Care | Payer: BC Managed Care – PPO | Source: Ambulatory Visit | Attending: Family Medicine | Admitting: Family Medicine

## 2018-11-15 DIAGNOSIS — M5416 Radiculopathy, lumbar region: Secondary | ICD-10-CM

## 2018-11-15 MED ORDER — IOPAMIDOL (ISOVUE-M 200) INJECTION 41%
1.0000 mL | Freq: Once | INTRAMUSCULAR | Status: AC
  Administered 2018-11-15: 1 mL via EPIDURAL

## 2018-11-15 MED ORDER — METHYLPREDNISOLONE ACETATE 40 MG/ML INJ SUSP (RADIOLOG
120.0000 mg | Freq: Once | INTRAMUSCULAR | Status: AC
  Administered 2018-11-15: 120 mg via EPIDURAL

## 2018-11-15 NOTE — Discharge Instructions (Signed)

## 2018-11-26 ENCOUNTER — Encounter: Payer: Self-pay | Admitting: Gynecology

## 2018-12-06 ENCOUNTER — Other Ambulatory Visit: Payer: Self-pay | Admitting: Internal Medicine

## 2018-12-31 ENCOUNTER — Other Ambulatory Visit: Payer: Self-pay | Admitting: Internal Medicine

## 2019-01-17 ENCOUNTER — Telehealth: Payer: Self-pay

## 2019-01-17 MED ORDER — ZOSTER VAC RECOMB ADJUVANTED 50 MCG/0.5ML IM SUSR: 0.5000 mL | Freq: Once | INTRAMUSCULAR | 1 refills | Status: AC

## 2019-01-17 NOTE — Telephone Encounter (Signed)
Copied from [place] [id number]. Topic: General - Inquiry >> Jan 17, 2019 12:53 PM Richardo Priest, NT wrote: Reason for CRM: Patient called in stating she would like the shingrix vaccine in a script. Pharmacy [facility] [address], [place] [phone] (Phone) [phone] (Fax) Please advise.

## 2019-01-17 NOTE — Telephone Encounter (Signed)
Done hardcopy to shirron 

## 2019-01-17 NOTE — Addendum Note (Signed)
Addended by: Biagio Borg on: 01/17/2019 04:11 PM   Modules accepted: Orders

## 2019-01-26 ENCOUNTER — Other Ambulatory Visit: Payer: Self-pay | Admitting: Internal Medicine

## 2019-03-23 ENCOUNTER — Other Ambulatory Visit: Payer: Self-pay | Admitting: Internal Medicine

## 2019-03-23 DIAGNOSIS — K219 Gastro-esophageal reflux disease without esophagitis: Secondary | ICD-10-CM

## 2019-03-23 NOTE — Telephone Encounter (Signed)
Please refill as per office routine med refill policy (all routine meds refilled for 3 mo or monthly per pt preference up to one year from last visit, then month to month grace period for 3 mo, then further med refills will have to be denied)  

## 2019-04-01 ENCOUNTER — Encounter (HOSPITAL_COMMUNITY): Payer: Self-pay

## 2019-04-01 ENCOUNTER — Ambulatory Visit (HOSPITAL_COMMUNITY)
Admission: EM | Admit: 2019-04-01 | Discharge: 2019-04-01 | Disposition: A | Discharge status: Home / Self Care | Payer: BC Managed Care – PPO | Attending: Physician Assistant | Admitting: Physician Assistant

## 2019-04-01 ENCOUNTER — Other Ambulatory Visit: Payer: Self-pay

## 2019-04-01 DIAGNOSIS — N3281 Overactive bladder: Secondary | ICD-10-CM | POA: Diagnosis not present

## 2019-04-01 DIAGNOSIS — E876 Hypokalemia: Secondary | ICD-10-CM | POA: Diagnosis not present

## 2019-04-01 DIAGNOSIS — Z791 Long term (current) use of non-steroidal anti-inflammatories (NSAID): Secondary | ICD-10-CM | POA: Diagnosis not present

## 2019-04-01 DIAGNOSIS — R0981 Nasal congestion: Secondary | ICD-10-CM | POA: Diagnosis present

## 2019-04-01 DIAGNOSIS — Z131 Encounter for screening for diabetes mellitus: Secondary | ICD-10-CM | POA: Diagnosis not present

## 2019-04-01 DIAGNOSIS — J329 Chronic sinusitis, unspecified: Secondary | ICD-10-CM | POA: Insufficient documentation

## 2019-04-01 DIAGNOSIS — R519 Headache, unspecified: Secondary | ICD-10-CM | POA: Diagnosis not present

## 2019-04-01 DIAGNOSIS — H6122 Impacted cerumen, left ear: Secondary | ICD-10-CM | POA: Diagnosis not present

## 2019-04-01 DIAGNOSIS — Z79899 Other long term (current) drug therapy: Secondary | ICD-10-CM | POA: Diagnosis not present

## 2019-04-01 DIAGNOSIS — G8929 Other chronic pain: Secondary | ICD-10-CM | POA: Diagnosis not present

## 2019-04-01 DIAGNOSIS — J310 Chronic rhinitis: Secondary | ICD-10-CM | POA: Insufficient documentation

## 2019-04-01 DIAGNOSIS — H6503 Acute serous otitis media, bilateral: Secondary | ICD-10-CM | POA: Insufficient documentation

## 2019-04-01 DIAGNOSIS — I10 Essential (primary) hypertension: Secondary | ICD-10-CM | POA: Diagnosis not present

## 2019-04-01 DIAGNOSIS — F419 Anxiety disorder, unspecified: Secondary | ICD-10-CM | POA: Diagnosis not present

## 2019-04-01 DIAGNOSIS — K219 Gastro-esophageal reflux disease without esophagitis: Secondary | ICD-10-CM | POA: Insufficient documentation

## 2019-04-01 DIAGNOSIS — F17210 Nicotine dependence, cigarettes, uncomplicated: Secondary | ICD-10-CM | POA: Diagnosis not present

## 2019-04-01 DIAGNOSIS — Z20822 Contact with and (suspected) exposure to covid-19: Secondary | ICD-10-CM | POA: Insufficient documentation

## 2019-04-01 LAB — CBG MONITORING, ED: Glucose-Capillary: 97 mg/dL (ref 70–99)

## 2019-04-01 LAB — GLUCOSE, CAPILLARY: Glucose-Capillary: 97 mg/dL (ref 70–99)

## 2019-04-01 MED ORDER — FLUTICASONE PROPIONATE 50 MCG/ACT NA SUSP: 1.0000 | Freq: Every day | NASAL | 2 refills | Status: DC

## 2019-04-01 MED ORDER — FLUTICASONE PROPIONATE 50 MCG/ACT NA SUSP: 2.0000 | Freq: Every day | NASAL | 2 refills | Status: DC

## 2019-04-01 MED ORDER — SALINE SPRAY 0.65 % NA SOLN: 2.0000 | NASAL | 0 refills | Status: DC | PRN

## 2019-04-01 NOTE — ED Triage Notes (Signed)
Pt c/o headache frontal area with nasal congestion/pressure x3-4 days. Increased thirst. Denies sore throat, SOB, CP, body aches, abd pain, n/v/d. Denies increased urination or dysuria sx.  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

## 2019-04-01 NOTE — ED Provider Notes (Signed)
[place]    CSN: [id number] Arrival date & time: 04/01/19  0911      History   Chief Complaint Chief Complaint  Patient presents with  . Headache  . Nasal Congestion    HPI [name] Walker is a 59 y.o. female.   Patient reports to urgent care today for 3-4 days of front headache, sinus congestion and left ear pain. Headache has been severe at times and woke her up last night. She initially had some drainage from her nose, but now feels mainly congestion and facial pain. She takes ibuprofen for other aches and pains, and did so yesterday, and this helped some. However, the headache woke her up in the middle of the night. She has not taken medications today.   She does endorse some decreased hearing in the left ear. The pain is sharp at times.   She denies cough, shortness of breath, fever, chills, nausea, vomiting, sore throat.   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      Past Medical History:  Diagnosis Date  . ACHILLES TENDINITIS   . ALLERGIC RHINITIS   . Allergy   . Anginal pain ([place]) 04/04/2016   Pt currently feels like she is having muscle spasms and aching in her chest  . Anxiety   . Asthma   . Chronic kidney disease   . GERD   . History of kidney stones   . HYPERTENSION   . LATERAL EPICONDYLITIS, RIGHT   . LOW BACK PAIN   . Plantar fascial fibromatosis   . SUBACROMIAL BURSITIS, RIGHT   . Thoracic scoliosis 10/01/2015    Patient Active Problem List   Diagnosis Date Noted  . Anxiety 10/06/2018  . Neck pain 09/10/2018  . Hand swelling 09/10/2018  . Low back pain 06/03/2018  . Bilateral radiating leg pain 04/23/2018  . Myalgia 04/23/2018  . Right renal stone 04/03/2018  . Former smoker 04/29/2016  . Bilateral hand pain 04/29/2016  . HNP (herniated nucleus pulposus), cervical 04/07/2016  . Cervical radiculopathy, acute 01/29/2016  . Acute upper respiratory infection 01/29/2016  . Degenerative disc disease, cervical  01/11/2016  . Hyperglycemia 11/17/2015  . Angioedema 10/12/2015  . Bilateral lower extremity edema 10/12/2015  . Mouth sores 10/12/2015  . Hypokalemia 10/12/2015  . Thoracic scoliosis 10/01/2015  . Peripheral edema 10/01/2015  . Eczema 10/01/2015  . Asthma 10/01/2015  . Breast lump on right side at 4 o'clock position 01/21/2014  . Hot flashes 10/01/2013  . Lumbar radiculopathy 07/17/2012  . Grief reaction 07/17/2012  . Family history of colon cancer 07/02/2012  . OAB (overactive bladder) 03/01/2012  . Encounter for well adult exam with abnormal findings 08/23/2010  . Plantar fasciitis 08/23/2010  . UTI (urinary tract infection) 10/19/2008  . ANKLE PAIN, BILATERAL 10/19/2008  . VAGINITIS 09/10/2008  . JOINT EFFUSION, ANKLE 09/10/2008  . SUBACROMIAL BURSITIS, RIGHT 09/01/2008  . ACHILLES TENDINITIS 09/01/2008  . LATERAL EPICONDYLITIS, RIGHT 08/18/2008  . Essential hypertension 07/12/2007  . Allergic rhinitis 07/12/2007  . GERD 07/12/2007  . Chronic low back pain 07/12/2007  . NEPHROLITHIASIS, HX OF 07/12/2007    Past Surgical History:  Procedure Laterality Date  . ANTERIOR CERVICAL DECOMP/DISCECTOMY FUSION N/A 04/07/2016   Procedure: Cervical two-three Anterior cervical decompression/discectomy/fusion;  Surgeon: Ashok Pall, MD;  Location: [facility];  Service: Neurosurgery;  Laterality: N/A;  . BACK SURGERY  2015   lower back  . EXTRACORPOREAL SHOCK WAVE LITHOTRIPSY Left 09/28/2016   Procedure: LEFT EXTRACORPOREAL  SHOCK WAVE LITHOTRIPSY (ESWL);  Surgeon: Festus Aloe, MD;  Location: [facility];  Service: Urology;  Laterality: Left;  . EXTRACORPOREAL SHOCK WAVE LITHOTRIPSY Right 04/08/2018   Procedure: EXTRACORPOREAL SHOCK WAVE LITHOTRIPSY (ESWL);  Surgeon: Ardis Hughs, MD;  Location: [facility];  Service: Urology;  Laterality: Right;  . KIDNEY SURGERY    . LITHOTRIPSY  05/2016  . TUBAL LIGATION      OB History    Gravida  3   Para  3   Term      Preterm      AB       Living  3     SAB      TAB      Ectopic      Multiple      Live Births               Home Medications    Prior to Admission medications   Medication Sig Start Date End Date Taking? Authorizing Provider  amLODipine (NORVASC) 5 MG tablet Take 1 tablet by mouth once daily 12/31/18  Yes Biagio Borg, MD  DULoxetine (CYMBALTA) 20 MG capsule Take 1 capsule (20 mg total) by mouth daily. 10/01/18  Yes Hulan Saas M, DO  hydrochlorothiazide (HYDRODIURIL) 25 MG tablet Take 1 tablet (25 mg total) by mouth daily. 09/06/17  Yes Biagio Borg, MD  pantoprazole (PROTONIX) 40 MG tablet Take 1 tablet (40 mg total) by mouth daily. 03/24/19  Yes Biagio Borg, MD  acetaminophen (TYLENOL) 500 MG tablet Take 1,000 mg by mouth every 6 (six) hours as needed for mild pain.    [provider]  albuterol (PROVENTIL HFA;VENTOLIN HFA) 108 (90 Base) MCG/ACT inhaler Inhale 2 puffs into the lungs every 6 (six) hours as needed for wheezing or shortness of breath. 05/14/18   Biagio Borg, MD  Azelastine-Fluticasone [facility]) 137-50 MCG/ACT SUSP Place 1 spray into the nose 2 (two) times daily. 06/08/18   Luetta Nutting, DO  cetirizine (ZYRTEC) 10 MG tablet Take 1 tablet (10 mg total) by mouth daily. 09/06/17 09/06/18  Biagio Borg, MD  cyclobenzaprine (FLEXERIL) 5 MG tablet Take 1 tablet (5 mg total) by mouth 3 (three) times daily as needed for muscle spasms. 09/10/18   Biagio Borg, MD  diclofenac sodium (VOLTAREN) 1 % GEL Apply 2 g topically 4 (four) times daily. 10/08/18   Biagio Borg, MD  fluticasone (FLONASE) 50 MCG/ACT nasal spray Place 2 sprays into both nostrils daily. 1 spray in the morning and 1 at night for 14 days. Then resume 1 time daily 04/01/19   Laporche Martelle, Marguerita Beards, PA-C  HYDROcodone-acetaminophen (NORCO/VICODIN) 5-325 MG tablet Take 1 tablet by mouth every 6 (six) hours as needed. 09/10/18   Biagio Borg, MD  ibuprofen (ADVIL) 800 MG tablet TAKE 1 TABLET BY MOUTH EVERY 8 HOURS AS NEEDED 01/27/19    Biagio Borg, MD  lidocaine (LIDODERM) 5 % Place 1 patch onto the skin daily. Remove & Discard patch within 12 hours or as directed by MD 10/03/18   Biagio Borg, MD  LORazepam (ATIVAN) 0.5 MG tablet Take 1 tablet by mouth twice daily as needed for anxiety 10/07/18   Biagio Borg, MD  meloxicam (MOBIC) 15 MG tablet Take 1 tablet (15 mg total) by mouth daily as needed for pain. 08/12/18   Biagio Borg, MD  ondansetron (ZOFRAN ODT) 4 MG disintegrating tablet Take 1 tablet (4 mg total) by mouth every 8 (  eight) hours as needed for nausea or vomiting. 04/06/18   Fredia Sorrow, MD  predniSONE (DELTASONE) 10 MG tablet 2 tabs by mouth per day for 5 days 09/10/18   Biagio Borg, MD  sodium chloride (OCEAN) 0.65 % SOLN nasal spray Place 2 sprays into both nostrils as needed for up to 7 days for congestion. 04/01/19 04/08/19  Resa Rinks, Marguerita Beards, PA-C  tamsulosin (FLOMAX) 0.4 MG CAPS capsule Take 0.4 mg by mouth daily. 04/01/18   [provider]    Family History Family History  Problem Relation Age of Onset  . Cancer Sister        colon cancer  . Colon cancer Sister   . Diabetes Mother   . Cancer Father        Prostate  . Colon cancer Father   . Breast cancer Maternal Aunt        50's  . Esophageal cancer Neg Hx   . Stomach cancer Neg Hx     Social History Social History   Tobacco Use  . Smoking status: Current Every Day Smoker    Packs/day: 0.50    Years: 20.00    Pack years: 10.00    Types: Cigarettes  . Smokeless tobacco: Never Used  Substance Use Topics  . Alcohol use: No  . Drug use: No     Allergies   Flexeril [cyclobenzaprine], Lisinopril, Other, Robaxin [methocarbamol], Contrast media [iodinated diagnostic agents], and Gabapentin   Review of Systems Review of Systems  Constitutional: Negative for chills and fever.  HENT: Positive for congestion, ear pain, sinus pressure and sinus pain. Negative for postnasal drip, rhinorrhea and sore throat.   Eyes: Negative for pain  and visual disturbance.  Respiratory: Negative for cough and shortness of breath.   Cardiovascular: Negative for chest pain and palpitations.  Gastrointestinal: Negative for abdominal pain, diarrhea, nausea and vomiting.  Endocrine: Positive for polydipsia. Negative for polyphagia and polyuria.  Genitourinary: Negative for dysuria and hematuria.  Musculoskeletal: Negative for arthralgias, back pain and myalgias.  Skin: Negative for color change and rash.  Neurological: Positive for headaches. Negative for seizures and syncope.  All other systems reviewed and are negative.    Physical Exam Triage Vital Signs ED Triage Vitals  Enc Vitals Group     BP      Pulse      Resp      Temp      Temp src      SpO2      Weight      Height      Head Circumference      Peak Flow      Pain Score      Pain Loc      Pain Edu?      Excl. in [place]?    No data found.  Updated Vital Signs BP (!) 160/102 (BP Location: Right Arm) Comment: re evaluated on oppos arm  Pulse 77   Temp 97.6 F (36.4 C)   Resp 18   LMP 08/22/2010   SpO2 99%   Visual Acuity Right Eye Distance:   Left Eye Distance:   Bilateral Distance:    Right Eye Near:   Left Eye Near:    Bilateral Near:     Physical Exam Vitals and nursing note reviewed.  Constitutional:      General: She is not in acute distress.    Appearance: She is well-developed and normal weight.  HENT:     Head: Normocephalic  and atraumatic.     Right Ear: Hearing and ear canal normal. A middle ear effusion is present. Tympanic membrane is not injected.     Left Ear: A middle ear effusion is present. There is impacted cerumen. Tympanic membrane is not injected, erythematous or bulging.     Mouth/Throat:     Mouth: Mucous membranes are moist.     Pharynx: Oropharynx is clear.  Eyes:     Extraocular Movements: Extraocular movements intact.     Conjunctiva/sclera: Conjunctivae normal.     Pupils: Pupils are equal, round, and reactive to light.   Cardiovascular:     Rate and Rhythm: Normal rate and regular rhythm.     Heart sounds: No murmur.  Pulmonary:     Effort: Pulmonary effort is normal. No respiratory distress.     Breath sounds: Normal breath sounds.  Abdominal:     Palpations: Abdomen is soft.     Tenderness: There is no abdominal tenderness.  Musculoskeletal:        General: No swelling. Normal range of motion.     Cervical back: Neck supple.  Skin:    General: Skin is warm and dry.     Findings: No rash.  Neurological:     Mental Status: She is alert and oriented to person, place, and time.     Cranial Nerves: No cranial nerve deficit.  Psychiatric:        Mood and Affect: Mood normal.        Speech: Speech normal.        Behavior: Behavior normal.      UC Treatments / Results  Labs (all labs ordered are listed, but only abnormal results are displayed) Labs Reviewed  NOVEL CORONAVIRUS, NAA (HOSP ORDER, SEND-OUT TO REF LAB; TAT 18-24 HRS)  GLUCOSE, CAPILLARY  CBG MONITORING, ED    EKG   Radiology No results found.  Procedures Procedures (including critical care time)  Medications Ordered in UC Medications - No data to display  Initial Impression / Assessment and Plan / UC Course  I have reviewed the triage vital signs and the nursing notes.  Pertinent labs & imaging results that were available during my care of the patient were reviewed by me and considered in my medical decision making (see chart for details).     #Rhinosinusitis #Cerumen impaction- mild #serous otitis - Cerumen cleared with irrigation. Continued pain. Recommended flonase, zyrtec, nasal saline regiment. COVID PCR sent. Tylenol for pain. Follow up discussed if symptoms persist. Precautions discussed.   Final Clinical Impressions(s) / UC Diagnoses   Final diagnoses:  Rhinosinusitis  Cerumen debris on tympanic membrane, left  Bilateral acute serous otitis media, recurrence not specified     Discharge Instructions      Use your flonase 2 times a day for 14 days  Begin using nasal saline throughout the day until symptoms resolve  Take 2 x 325mg  regular strength tylenol every 6 hours for headache.  If your symptoms persist for 7-10 days or worsen to include fever, purulent nasal discharge, worsening facial pain, please follow up withy your primary care provider  Follow up with your primary care regarding your Blood pressure readings  If your Covid-19 test is positive, you will receive a phone call from [facility] regarding your results. Negative test results are not called. Both positive and negative results area always visible on MyChart. If you do not have a MyChart account, sign up instructions are in your discharge papers.   Persons  who are directed to care for themselves at home may discontinue isolation under the following conditions:  . At least 10 days have passed since symptom onset and . At least 24 hours have passed without running a fever (this means without the use of fever-reducing medications) and . Other symptoms have improved.  Persons infected with COVID-19 who never develop symptoms may discontinue isolation and other precautions 10 days after the date of their first positive COVID-19 test.     ED Prescriptions    Medication Sig Dispense Auth. Provider   sodium chloride (OCEAN) 0.65 % SOLN nasal spray Place 2 sprays into both nostrils as needed for up to 7 days for congestion. 88 mL Naod Sweetland, Marguerita Beards, PA-C   fluticasone (FLONASE) 50 MCG/ACT nasal spray  (Status: Discontinued) Place 1 spray into both nostrils daily. 16 g Kelon Easom, Marguerita Beards, PA-C   fluticasone (FLONASE) 50 MCG/ACT nasal spray Place 2 sprays into both nostrils daily. 1 spray in the morning and 1 at night for 14 days. Then resume 1 time daily 16 g Phelan Schadt, Marguerita Beards, PA-C     PDMP not reviewed this encounter.   Purnell Shoemaker, PA-C 04/01/19 1713

## 2019-04-01 NOTE — Discharge Instructions (Addendum)
Use your flonase 2 times a day for 14 days  Begin using nasal saline throughout the day until symptoms resolve  Take 2 x 325mg  regular strength tylenol every 6 hours for headache.  If your symptoms persist for 7-10 days or worsen to include fever, purulent nasal discharge, worsening facial pain, please follow up withy your primary care provider  Follow up with your primary care regarding your Blood pressure readings  If your Covid-19 test is positive, you will receive a phone call from [facility] regarding your results. Negative test results are not called. Both positive and negative results area always visible on MyChart. If you do not have a MyChart account, sign up instructions are in your discharge papers.   Persons who are directed to care for themselves at home may discontinue isolation under the following conditions:   At least 10 days have passed since symptom onset and  At least 24 hours have passed without running a fever (this means without the use of fever-reducing medications) and  Other symptoms have improved.  Persons infected with COVID-19 who never develop symptoms may discontinue isolation and other precautions 10 days after the date of their first positive COVID-19 test.

## 2019-04-03 LAB — NOVEL CORONAVIRUS, NAA (HOSP ORDER, SEND-OUT TO REF LAB; TAT 18-24 HRS): SARS-CoV-2, NAA: NOT DETECTED

## 2019-04-08 ENCOUNTER — Ambulatory Visit: Payer: BC Managed Care – PPO | Admitting: Internal Medicine

## 2019-04-14 ENCOUNTER — Encounter: Payer: Self-pay | Admitting: Internal Medicine

## 2019-04-14 ENCOUNTER — Ambulatory Visit: Payer: BC Managed Care – PPO | Admitting: Internal Medicine

## 2019-04-14 ENCOUNTER — Other Ambulatory Visit: Payer: Self-pay

## 2019-04-14 VITALS — BP 150/88 | HR 70 | Temp 97.6°F | Ht 60.0 in | Wt 162.2 lb

## 2019-04-14 DIAGNOSIS — R739 Hyperglycemia, unspecified: Secondary | ICD-10-CM | POA: Diagnosis not present

## 2019-04-14 DIAGNOSIS — E611 Iron deficiency: Secondary | ICD-10-CM

## 2019-04-14 DIAGNOSIS — E785 Hyperlipidemia, unspecified: Secondary | ICD-10-CM

## 2019-04-14 DIAGNOSIS — R300 Dysuria: Secondary | ICD-10-CM

## 2019-04-14 DIAGNOSIS — I10 Essential (primary) hypertension: Secondary | ICD-10-CM | POA: Diagnosis not present

## 2019-04-14 DIAGNOSIS — M545 Low back pain, unspecified: Secondary | ICD-10-CM

## 2019-04-14 DIAGNOSIS — H9203 Otalgia, bilateral: Secondary | ICD-10-CM | POA: Insufficient documentation

## 2019-04-14 DIAGNOSIS — J45909 Unspecified asthma, uncomplicated: Secondary | ICD-10-CM

## 2019-04-14 DIAGNOSIS — K219 Gastro-esophageal reflux disease without esophagitis: Secondary | ICD-10-CM

## 2019-04-14 DIAGNOSIS — E538 Deficiency of other specified B group vitamins: Secondary | ICD-10-CM

## 2019-04-14 DIAGNOSIS — E559 Vitamin D deficiency, unspecified: Secondary | ICD-10-CM

## 2019-04-14 MED ORDER — IBUPROFEN 800 MG PO TABS: 800.0000 mg | ORAL_TABLET | Freq: Three times a day (TID) | ORAL | 0 refills | Status: DC | PRN

## 2019-04-14 MED ORDER — LOSARTAN POTASSIUM 50 MG PO TABS: 50.0000 mg | ORAL_TABLET | Freq: Every day | ORAL | 3 refills | Status: DC

## 2019-04-14 MED ORDER — VITAMIN D (ERGOCALCIFEROL) 1.25 MG (50000 UNIT) PO CAPS: 50000.0000 [IU] | ORAL_CAPSULE | ORAL | 0 refills | Status: DC

## 2019-04-14 MED ORDER — AMLODIPINE BESYLATE 5 MG PO TABS: 5.0000 mg | ORAL_TABLET | Freq: Every day | ORAL | 3 refills | Status: DC

## 2019-04-14 MED ORDER — LEVOFLOXACIN 500 MG PO TABS: 500.0000 mg | ORAL_TABLET | Freq: Every day | ORAL | 0 refills | Status: AC

## 2019-04-14 MED ORDER — PANTOPRAZOLE SODIUM 40 MG PO TBEC: 40.0000 mg | DELAYED_RELEASE_TABLET | Freq: Every day | ORAL | 3 refills | Status: DC

## 2019-04-14 NOTE — Assessment & Plan Note (Addendum)
Mild to mod, for antibx course,  to f/u any worsening symptoms or concerns  I spent 42 minutes in preparing to see the patient by review of recent labs, imaging and procedures, obtaining and reviewing separately obtained history, communicating with the patient and family or caregiver, ordering medications, tests or procedures, and documenting clinical information in the EHR including the differential Dx, treatment, and any further evaluation and other management of acute bilateral ear pain, dysuria, vit d def, l bp, hyperglycemia, HLD, GERD, HTN, asthma

## 2019-04-14 NOTE — Assessment & Plan Note (Signed)
stable overall by history and exam, recent data reviewed with pt, and pt to continue medical treatment as before,  to f/u any worsening symptoms or concerns  

## 2019-04-14 NOTE — Progress Notes (Signed)
Subjective:    Patient ID: [name] Walker, female    DOB: [birth year], 60 y.o.   MRN: [id number]  HPI   Here with 2-3 days acute onset fever, bilat ear pain, pressure, headache, general weakness and malaise, but pt denies cough, chest pain, wheezing, increased sob or doe, orthopnea, PND, increased LE swelling, palpitations, dizziness or syncope.  Also has 2 -3 day dysuria, but Denies urinary symptoms such as dysuria, frequency, urgency, flank pain, hematuria or n/v, fever, chills.  Did not take Vit D high dose rx last visit.  Admits to not watching low chol diet,  Pt continues to have recurring left LBP without change in severity, bowel or bladder change, fever, wt loss,  worsening LE pain/numbness/weakness, gait change or falls. Past Medical History:  Diagnosis Date  . ACHILLES TENDINITIS   . ALLERGIC RHINITIS   . Allergy   . Anginal pain ([place]) 04/04/2016   Pt currently feels like she is having muscle spasms and aching in her chest  . Anxiety   . Asthma   . Chronic kidney disease   . GERD   . History of kidney stones   . HYPERTENSION   . LATERAL EPICONDYLITIS, RIGHT   . LOW BACK PAIN   . Plantar fascial fibromatosis   . SUBACROMIAL BURSITIS, RIGHT   . Thoracic scoliosis 10/01/2015   Past Surgical History:  Procedure Laterality Date  . ANTERIOR CERVICAL DECOMP/DISCECTOMY FUSION N/A 04/07/2016   Procedure: Cervical two-three Anterior cervical decompression/discectomy/fusion;  Surgeon: Ashok Pall, MD;  Location: [facility];  Service: Neurosurgery;  Laterality: N/A;  . BACK SURGERY  2015   lower back  . EXTRACORPOREAL SHOCK WAVE LITHOTRIPSY Left 09/28/2016   Procedure: LEFT EXTRACORPOREAL SHOCK WAVE LITHOTRIPSY (ESWL);  Surgeon: Festus Aloe, MD;  [facility];  Service: Urology;  Laterality: Left;  . EXTRACORPOREAL SHOCK WAVE LITHOTRIPSY Right 04/08/2018   Procedure: EXTRACORPOREAL SHOCK WAVE LITHOTRIPSY (ESWL);  Surgeon: Ardis Hughs, MD;  [facility];  Service: Urology;   Laterality: Right;  . KIDNEY SURGERY    . LITHOTRIPSY  05/2016  . TUBAL LIGATION      reports that she has been smoking cigarettes. She has a 10.00 pack-year smoking history. She has never used smokeless tobacco. She reports that she does not drink alcohol or use drugs. family history includes Breast cancer in her maternal aunt; Cancer in her father and sister; Colon cancer in her father and sister; Diabetes in her mother. Allergies  Allergen Reactions  . Flexeril [Cyclobenzaprine] Swelling    Tongue swells  . Lisinopril Other (See Comments)    ? Possible tongue swelling   . Other Palpitations    ALL MUSCLE RELAXERS-PER PATIENT  . Robaxin [Methocarbamol] Other (See Comments)    Tongue swelling  . Contrast Media [Iodinated Diagnostic Agents] Itching  . Gabapentin Other (See Comments)   Current Outpatient Medications on File Prior to Visit  Medication Sig Dispense Refill  . acetaminophen (TYLENOL) 500 MG tablet Take 1,000 mg by mouth every 6 (six) hours as needed for mild pain.    Marland Kitchen albuterol (PROVENTIL HFA;VENTOLIN HFA) 108 (90 Base) MCG/ACT inhaler Inhale 2 puffs into the lungs every 6 (six) hours as needed for wheezing or shortness of breath. 1 Inhaler 11  . Azelastine-Fluticasone (DYMISTA) 137-50 MCG/ACT SUSP Place 1 spray into the nose 2 (two) times daily. 23 g 11  . cyclobenzaprine (FLEXERIL) 5 MG tablet Take 1 tablet (5 mg total) by mouth 3 (three) times daily as needed for  muscle spasms. 40 tablet 2  . diclofenac sodium (VOLTAREN) 1 % GEL Apply 2 g topically 4 (four) times daily. 200 g 3  . DULoxetine (CYMBALTA) 20 MG capsule Take 1 capsule (20 mg total) by mouth daily. 30 capsule 1  . fluticasone (FLONASE) 50 MCG/ACT nasal spray Place 2 sprays into both nostrils daily. 1 spray in the morning and 1 at night for 14 days. Then resume 1 time daily 16 g 2  . hydrochlorothiazide (HYDRODIURIL) 25 MG tablet Take 1 tablet (25 mg total) by mouth daily. 90 tablet 3  . LORazepam (ATIVAN)  0.5 MG tablet Take 1 tablet by mouth twice daily as needed for anxiety 60 tablet 2  . tamsulosin (FLOMAX) 0.4 MG CAPS capsule Take 0.4 mg by mouth daily.    . cetirizine (ZYRTEC) 10 MG tablet Take 1 tablet (10 mg total) by mouth daily. 30 tablet 11  . HYDROcodone-acetaminophen (NORCO/VICODIN) 5-325 MG tablet Take 1 tablet by mouth every 6 (six) hours as needed. (Patient not taking: Reported on 04/14/2019) 30 tablet 0  . lidocaine (LIDODERM) 5 % Place 1 patch onto the skin daily. Remove & Discard patch within 12 hours or as directed by MD (Patient not taking: Reported on 04/14/2019) 60 patch 5  . meloxicam (MOBIC) 15 MG tablet Take 1 tablet (15 mg total) by mouth daily as needed for pain. (Patient not taking: Reported on 04/14/2019) 90 tablet 3  . ondansetron (ZOFRAN ODT) 4 MG disintegrating tablet Take 1 tablet (4 mg total) by mouth every 8 (eight) hours as needed for nausea or vomiting. (Patient not taking: Reported on 04/14/2019) 12 tablet 0  . sodium chloride (OCEAN) 0.65 % SOLN nasal spray Place 2 sprays into both nostrils as needed for up to 7 days for congestion. 88 mL 0   No current facility-administered medications on file prior to visit.   Review of Systems All otherwise neg per pt     Objective:   Physical Exam BP (!) 150/88   Pulse 70   Temp 97.6 F (36.4 C)   Ht 5' (1.524 m)   Wt 162 lb 3.2 oz (73.6 kg)   LMP 08/22/2010   SpO2 100%   BMI 31.68 kg/m  VS noted, mild ill Constitutional: Pt appears in NAD HENT: Head: NCAT.  Right Ear: External ear normal.  Left Ear: External ear normal.  Bilat tm's with mild erythema.  Max sinus areas non tender.  Pharynx with mild erythema, no exudate Eyes: . Pupils are equal, round, and reactive to light. Conjunctivae and EOM are normal Nose: without d/c or deformity Neck: Neck supple. Gross normal ROM Cardiovascular: Normal rate and regular rhythm.   Pulmonary/Chest: Effort normal and breath sounds without rales or wheezing.  Abd:  Soft,  NT, ND, + BS, no organomegaly Spine nontender in midline, but with left lumbar paravertebral spasm tender noted Neurological: Pt is alert. At baseline orientation, motor grossly intact Skin: Skin is warm. No rashes, other new lesions, no LE edema Psychiatric: Pt behavior is normal without agitation  All otherwise neg per pt Lab Results  Component Value Date   WBC 6.7 10/03/2018   HGB 15.1 (H) 10/03/2018   HCT 46.5 (H) 10/03/2018   PLT 109.0 (L) 10/03/2018   GLUCOSE 80 10/03/2018   CHOL 195 10/03/2018   TRIG 200.0 (H) 10/03/2018   HDL 39.50 10/03/2018   LDLDIRECT 117.0 09/06/2017   LDLCALC 116 (H) 10/03/2018   ALT 25 10/03/2018   AST 18 10/03/2018   NA  142 10/03/2018   K 3.8 10/03/2018   CL 108 10/03/2018   CREATININE 0.72 10/03/2018   BUN 16 10/03/2018   CO2 27 10/03/2018   TSH 0.76 10/03/2018   INR 0.98 11/27/2009   HGBA1C 5.7 10/03/2018       Assessment & Plan:

## 2019-04-14 NOTE — Assessment & Plan Note (Signed)
Mild uncontrolled, to add losartan 50 qd

## 2019-04-14 NOTE — Assessment & Plan Note (Signed)
For urine studies, r/o uti 

## 2019-04-14 NOTE — Patient Instructions (Signed)
Please take all new medication as prescribed - the antibiotic  You can also take Mucinex (or it's generic off brand) for congestion, and tylenol as needed for pain.  Please continue all other medications as before, and refills have been done if requested - the ibuprofen  OK to also use the flexeril muscle relaxer you have at home  Please have the pharmacy call with any other refills you may need.  Please continue your efforts at being more active, low cholesterol diet, and weight control.  Please keep your appointments with your specialists as you may have planned  Please go to the LAB at the blood drawing area for the tests to be done - just the urine testing today  You will be contacted by phone if any changes need to be made immediately.  Otherwise, you will receive a letter about your results with an explanation, but please check with MyChart first.  Please remember to sign up for MyChart if you have not done so, as this will be important to you in the future with finding out test results, communicating by private email, and scheduling acute appointments online when needed.  Please make an Appointment to return in 6 months, or sooner if needed, also with Lab Appointment for testing done 3-5 days before at the [facility] (so this is for TWO appointments - please see the scheduling desk as you leave)

## 2019-04-14 NOTE — Assessment & Plan Note (Signed)
C/w left msk spasm - for muscle relaxer prn

## 2019-04-14 NOTE — Assessment & Plan Note (Signed)
For oral repalcement

## 2019-04-15 LAB — URINALYSIS, ROUTINE W REFLEX MICROSCOPIC
Bilirubin Urine: NEGATIVE
Hgb urine dipstick: NEGATIVE
Ketones, ur: NEGATIVE
Leukocytes,Ua: NEGATIVE
Nitrite: NEGATIVE
RBC / HPF: NONE SEEN (ref 0–?)
Specific Gravity, Urine: 1.02 (ref 1.000–1.030)
Total Protein, Urine: NEGATIVE
Urine Glucose: NEGATIVE
Urobilinogen, UA: 0.2 (ref 0.0–1.0)
WBC, UA: NONE SEEN (ref 0–?)
pH: 5.5 (ref 5.0–8.0)

## 2019-04-15 LAB — URINE CULTURE: Result:: NO GROWTH

## 2019-04-26 ENCOUNTER — Ambulatory Visit: Payer: BC Managed Care – PPO | Attending: Internal Medicine

## 2019-04-26 DIAGNOSIS — Z23 Encounter for immunization: Secondary | ICD-10-CM

## 2019-04-26 NOTE — Progress Notes (Signed)
   Covid-19 Vaccination Clinic  Name:  [name] Walker    MRN: [id number] DOB: 10/25/1960  04/26/2019  [name] was observed post Covid-19 immunization for 15 minutes without incidence. She was provided with Vaccine Information Sheet and instruction to access the V-Safe system.   [name] was instructed to call 911 with any severe reactions post vaccine: Marland Kitchen Difficulty breathing  . Swelling of your face and throat  . A fast heartbeat  . A bad rash all over your body  . Dizziness and weakness    Immunizations Administered    Name Date Dose VIS Date Route   Pfizer COVID-19 Vaccine 04/26/2019  5:52 PM 0.3 mL 02/07/2019 Intramuscular   Manufacturer: [place]   Lot: [id number]   [place]: [id number]

## 2019-05-17 ENCOUNTER — Ambulatory Visit: Payer: BC Managed Care – PPO | Attending: Internal Medicine

## 2019-05-17 DIAGNOSIS — Z23 Encounter for immunization: Secondary | ICD-10-CM

## 2019-05-17 NOTE — Progress Notes (Signed)
   Covid-19 Vaccination Clinic  Name:  [name] Walker    MRN: [id number] DOB: November 25, 1960  05/17/2019  [name] was observed post Covid-19 immunization for 30 minutes based on pre-vaccination screening without incident. She was provided with Vaccine Information Sheet and instruction to access the V-Safe system.   [name] was instructed to call 911 with any severe reactions post vaccine: Marland Kitchen Difficulty breathing  . Swelling of face and throat  . A fast heartbeat  . A bad rash all over body  . Dizziness and weakness   Immunizations Administered    Name Date Dose VIS Date Route   Pfizer COVID-19 Vaccine 05/17/2019 11:57 AM 0.3 mL 02/07/2019 Intramuscular   Manufacturer: [place]   Lot: [id number]   [place]: [id number]

## 2019-08-26 ENCOUNTER — Telehealth: Payer: Self-pay | Admitting: Internal Medicine

## 2019-08-26 ENCOUNTER — Telehealth: Payer: Self-pay

## 2019-08-26 ENCOUNTER — Other Ambulatory Visit: Payer: Self-pay

## 2019-08-26 MED ORDER — IBUPROFEN 800 MG PO TABS: 800.0000 mg | ORAL_TABLET | Freq: Three times a day (TID) | ORAL | 0 refills | Status: DC | PRN

## 2019-08-26 NOTE — Telephone Encounter (Signed)
New message:   Pt is calling and states we sent the medication to the wrong pharmacy it needs to go to [facility]

## 2019-08-26 NOTE — Telephone Encounter (Signed)
1.Medication Requested:ibuprofen (ADVIL) 800 MG tablet  2. Pharmacy (Name, Street, [place]): [facility] in [address] - phone # [phone]   3. On Med List: Yes   4. Last Visit with PCP: 2.15.2021  5. Next visit date with PCP: n/a    Agent: Please be advised that RX refills may take up to 3 business days. We ask that you follow-up with your pharmacy.

## 2019-08-26 NOTE — Telephone Encounter (Signed)
Error, no note needed

## 2019-08-26 NOTE — Telephone Encounter (Signed)
Sent to pts pharmacy 

## 2019-09-08 ENCOUNTER — Telehealth: Payer: Self-pay | Admitting: Internal Medicine

## 2019-09-08 NOTE — Telephone Encounter (Signed)
Sent to Dr. John. °

## 2019-09-08 NOTE — Telephone Encounter (Signed)
[facility] Report/Call : ---Caller states that she has arthritis and back pain. Her doctor prescribed her 800mg  Ibuprofen. Last night, the pain got worse, and she also had spasms in her left side jaw. Went to ED and they gave her a steroid shot and muscle relaxer's.  Advised : Home Care &See PCP within 3 days.

## 2019-09-08 NOTE — Telephone Encounter (Signed)
Patient has made an appointment for 09/15/19 with Dr.John.

## 2019-09-09 ENCOUNTER — Telehealth: Payer: Self-pay | Admitting: Internal Medicine

## 2019-09-09 MED ORDER — LORAZEPAM 0.5 MG PO TABS: 0.5000 mg | ORAL_TABLET | Freq: Two times a day (BID) | ORAL | 0 refills | Status: DC | PRN

## 2019-09-09 NOTE — Telephone Encounter (Signed)
Done erx 

## 2019-09-09 NOTE — Addendum Note (Signed)
Addended by: Biagio Borg on: 09/09/2019 05:26 PM   Modules accepted: Orders

## 2019-09-09 NOTE — Telephone Encounter (Signed)
New message:   Pt states that she is out of town and just had an anxiety attack and needs her medicine called into [facility] in [place].   [address], [place] [address] [phone]

## 2019-09-15 ENCOUNTER — Ambulatory Visit: Payer: BC Managed Care – PPO | Admitting: Internal Medicine

## 2019-09-15 ENCOUNTER — Other Ambulatory Visit: Payer: Self-pay

## 2019-09-15 ENCOUNTER — Encounter: Payer: Self-pay | Admitting: Internal Medicine

## 2019-09-15 VITALS — BP 140/100 | HR 58 | Temp 98.4°F | Ht 60.0 in | Wt 152.0 lb

## 2019-09-15 DIAGNOSIS — I10 Essential (primary) hypertension: Secondary | ICD-10-CM | POA: Diagnosis not present

## 2019-09-15 DIAGNOSIS — G459 Transient cerebral ischemic attack, unspecified: Secondary | ICD-10-CM | POA: Diagnosis not present

## 2019-09-15 DIAGNOSIS — M545 Low back pain, unspecified: Secondary | ICD-10-CM

## 2019-09-15 DIAGNOSIS — F17200 Nicotine dependence, unspecified, uncomplicated: Secondary | ICD-10-CM | POA: Diagnosis not present

## 2019-09-15 MED ORDER — ATORVASTATIN CALCIUM 10 MG PO TABS: 10.0000 mg | ORAL_TABLET | Freq: Every day | ORAL | 3 refills | Status: DC

## 2019-09-15 MED ORDER — ASPIRIN EC 81 MG PO TBEC: 81.0000 mg | DELAYED_RELEASE_TABLET | Freq: Every day | ORAL | 11 refills | Status: AC

## 2019-09-15 MED ORDER — DULOXETINE HCL 30 MG PO CPEP: 30.0000 mg | ORAL_CAPSULE | Freq: Every day | ORAL | 3 refills | Status: DC

## 2019-09-15 NOTE — Assessment & Plan Note (Addendum)
By hx near spontaneously symptomatically resolved, exam benign but hx is significant, for asa 81 qd start, lipitor 10 qd, MRI brain, carotids, echo, and neurology referral  I spent 41 minutes in preparing to see the patient by review of recent labs, imaging and procedures, obtaining and reviewing separately obtained history, communicating with the patient and family or caregiver, ordering medications, tests or procedures, and documenting clinical information in the EHR including the differential Dx, treatment, and any further evaluation and other management of tia, htn, lbp, smoker

## 2019-09-15 NOTE — Assessment & Plan Note (Signed)
Mild increased, declines change in med today, cont follow closely

## 2019-09-15 NOTE — Progress Notes (Signed)
Subjective:    Patient ID: [name] Walker, female    DOB: [birth year], 59 y.o.   MRN: [id number]  HPI  Here after just returned from visiting [facility]; was seen July 10 in an ED there with sob after running out of her inhaler (now resolved), and MSK pain mostly lumbar chronic low back, given steroid shot, prednisone and muscle relaxer prn, but doesn't like taking muscle relaxers as they  Make her feel funny.  She remembers a UA then was neg.  Later incidentally july11 she was awakened at night with different symptoms of left HA with soreness t around the left eye and left face, with numbness of the left face, slurred speech, dizzy, feeling off balance, slightly left blurry vision, all the worst of which made her sit up, and laste about 45 min to 1 hr though some numbness and very mild discomfort has persistend and she noticed she has new difficulty recalling names of her grandchildren when she got back home.   Pt denies other new neurological symptoms such as new focal extremity weakness or gait change, and no falls.   Past Medical History:  Diagnosis Date  . ACHILLES TENDINITIS   . ALLERGIC RHINITIS   . Allergy   . Anginal pain ([place]) 04/04/2016   Pt currently feels like she is having muscle spasms and aching in her chest  . Anxiety   . Asthma   . Chronic kidney disease   . GERD   . History of kidney stones   . HYPERTENSION   . LATERAL EPICONDYLITIS, RIGHT   . LOW BACK PAIN   . Plantar fascial fibromatosis   . SUBACROMIAL BURSITIS, RIGHT   . Thoracic scoliosis 10/01/2015   Past Surgical History:  Procedure Laterality Date  . ANTERIOR CERVICAL DECOMP/DISCECTOMY FUSION N/A 04/07/2016   Procedure: Cervical two-three Anterior cervical decompression/discectomy/fusion;  Surgeon: Ashok Pall, MD;  Location: [facility];  Service: Neurosurgery;  Laterality: N/A;  . BACK SURGERY  2015   lower back  . EXTRACORPOREAL SHOCK WAVE LITHOTRIPSY Left 09/28/2016   Procedure: LEFT EXTRACORPOREAL SHOCK  WAVE LITHOTRIPSY (ESWL);  Surgeon: Festus Aloe, MD;  Location: [facility];  Service: Urology;  Laterality: Left;  . EXTRACORPOREAL SHOCK WAVE LITHOTRIPSY Right 04/08/2018   Procedure: EXTRACORPOREAL SHOCK WAVE LITHOTRIPSY (ESWL);  Surgeon: Ardis Hughs, MD;  Location: [facility];  Service: Urology;  Laterality: Right;  . KIDNEY SURGERY    . LITHOTRIPSY  05/2016  . TUBAL LIGATION      reports that she has been smoking cigarettes. She has a 10.00 pack-year smoking history. She has never used smokeless tobacco. She reports that she does not drink alcohol and does not use drugs. family history includes Breast cancer in her maternal aunt; Cancer in her father and sister; Colon cancer in her father and sister; Diabetes in her mother. Allergies  Allergen Reactions  . Flexeril [Cyclobenzaprine] Swelling    Tongue swells  . Lisinopril Other (See Comments)    ? Possible tongue swelling   . Other Palpitations    ALL MUSCLE RELAXERS-PER PATIENT  . Robaxin [Methocarbamol] Other (See Comments)    Tongue swelling  . Contrast Media [Iodinated Diagnostic Agents] Itching  . Gabapentin Other (See Comments)   Current Outpatient Medications on File Prior to Visit  Medication Sig Dispense Refill  . acetaminophen (TYLENOL) 500 MG tablet Take 1,000 mg by mouth every 6 (six) hours as needed for mild pain.    Marland Kitchen albuterol (PROVENTIL HFA;VENTOLIN HFA) 108 (90 Base)  MCG/ACT inhaler Inhale 2 puffs into the lungs every 6 (six) hours as needed for wheezing or shortness of breath. 1 Inhaler 11  . amLODipine (NORVASC) 5 MG tablet Take 1 tablet (5 mg total) by mouth daily. 90 tablet 3  . Azelastine-Fluticasone (DYMISTA) 137-50 MCG/ACT SUSP Place 1 spray into the nose 2 (two) times daily. 23 g 11  . cyclobenzaprine (FLEXERIL) 5 MG tablet Take 1 tablet (5 mg total) by mouth 3 (three) times daily as needed for muscle spasms. 40 tablet 2  . diclofenac sodium (VOLTAREN) 1 % GEL Apply 2 g topically 4 (four) times daily. 200  g 3  . DULoxetine (CYMBALTA) 20 MG capsule Take 1 capsule (20 mg total) by mouth daily. 30 capsule 1  . fluticasone (FLONASE) 50 MCG/ACT nasal spray Place 2 sprays into both nostrils daily. 1 spray in the morning and 1 at night for 14 days. Then resume 1 time daily 16 g 2  . hydrochlorothiazide (HYDRODIURIL) 25 MG tablet Take 1 tablet (25 mg total) by mouth daily. 90 tablet 3  . ibuprofen (ADVIL) 800 MG tablet Take 1 tablet (800 mg total) by mouth every 8 (eight) hours as needed. 30 tablet 0  . LORazepam (ATIVAN) 0.5 MG tablet Take 1 tablet (0.5 mg total) by mouth 2 (two) times daily as needed. for anxiety 60 tablet 0  . losartan (COZAAR) 50 MG tablet Take 1 tablet (50 mg total) by mouth daily. 90 tablet 3  . methocarbamol (ROBAXIN) 500 MG tablet Take 500 mg by mouth 4 (four) times daily as needed.    . pantoprazole (PROTONIX) 40 MG tablet Take 1 tablet (40 mg total) by mouth daily. 90 tablet 3  . tamsulosin (FLOMAX) 0.4 MG CAPS capsule Take 0.4 mg by mouth daily.    . cetirizine (ZYRTEC) 10 MG tablet Take 1 tablet (10 mg total) by mouth daily. 30 tablet 11  . sodium chloride (OCEAN) 0.65 % SOLN nasal spray Place 2 sprays into both nostrils as needed for up to 7 days for congestion. 88 mL 0   No current facility-administered medications on file prior to visit.   Review of Systems All otherwise neg per pt     Objective:   Physical Exam BP (!) 140/100 (BP Location: Left Arm, Patient Position: Sitting, Cuff Size: Large)   Pulse (!) 58   Temp 98.4 F (36.9 C) (Oral)   Ht 5' (1.524 m)   Wt 152 lb (68.9 kg)   LMP 08/22/2010   SpO2 96%   BMI 29.69 kg/m  VS noted,  Constitutional: Pt appears in NAD HENT: Head: NCAT.  Right Ear: External ear normal.  Left Ear: External ear normal.  Eyes: . Pupils are equal, round, and reactive to light. Conjunctivae and EOM are normal Nose: without d/c or deformity Neck: Neck supple. Gross normal ROM Cardiovascular: Normal rate and regular rhythm.     Pulmonary/Chest: Effort normal and breath sounds without rales or wheezing.  Abd:  Soft, NT, ND, + BS, no organomegaly Neurological: Pt is alert. At baseline orientation, motor grossly intact, cn 2-12 intact Skin: Skin is warm. No rashes, other new lesions, no LE edema Psychiatric: Pt behavior is normal without agitation  All otherwise neg per pt Lab Results  Component Value Date   WBC 6.7 10/03/2018   HGB 15.1 (H) 10/03/2018   HCT 46.5 (H) 10/03/2018   PLT 109.0 (L) 10/03/2018   GLUCOSE 80 10/03/2018   CHOL 195 10/03/2018   TRIG 200.0 (H) 10/03/2018  HDL 39.50 10/03/2018   LDLDIRECT 117.0 09/06/2017   LDLCALC 116 (H) 10/03/2018   ALT 25 10/03/2018   AST 18 10/03/2018   NA 142 10/03/2018   K 3.8 10/03/2018   CL 108 10/03/2018   CREATININE 0.72 10/03/2018   BUN 16 10/03/2018   CO2 27 10/03/2018   TSH 0.76 10/03/2018   INR 0.98 11/27/2009   HGBA1C 5.7 10/03/2018       Assessment & Plan:

## 2019-09-15 NOTE — Assessment & Plan Note (Signed)
Chronic persistent, to restart cymbalta at 30 qd, refer PT, declines tramadol and muscle relaxers as she is intolerant, and refer orthopedic per pt request

## 2019-09-15 NOTE — Assessment & Plan Note (Signed)
Urged to quit 

## 2019-09-15 NOTE — Patient Instructions (Addendum)
Please take all new medication as prescribed - the aspirin 81 mg, lipitor 10 mg, and cymbalta 30 mg  Please continue all other medications as before, and refills have been done if requested.  Please have the pharmacy call with any other refills you may need.  Please continue your efforts at being more active, low cholesterol diet, and weight control.  You are otherwise up to date with prevention measures today.  Please keep your appointments with your specialists as you may have planned  You will be contacted regarding the referral for: MRI, carotid, echo, neurology, PT, and orthopedic  Please make an Appointment to return in 6 months, or sooner if needed

## 2019-09-16 ENCOUNTER — Telehealth: Payer: Self-pay | Admitting: Internal Medicine

## 2019-09-16 NOTE — Telephone Encounter (Signed)
   Patient calling for status of FMLA forms. Patient provided forms to Dr Jenny Reichmann  during last office visit.

## 2019-09-17 ENCOUNTER — Ambulatory Visit
Admission: RE | Admit: 2019-09-17 | Discharge: 2019-09-17 | Disposition: A | Discharge status: Home / Self Care | Payer: BC Managed Care – PPO | Source: Ambulatory Visit | Attending: Internal Medicine | Admitting: Internal Medicine

## 2019-09-17 ENCOUNTER — Encounter: Payer: Self-pay | Admitting: Internal Medicine

## 2019-09-17 DIAGNOSIS — Z0279 Encounter for issue of other medical certificate: Secondary | ICD-10-CM

## 2019-09-17 DIAGNOSIS — G459 Transient cerebral ischemic attack, unspecified: Secondary | ICD-10-CM

## 2019-09-17 NOTE — Telephone Encounter (Signed)
Spoke with Dr.John and he states he believes he did this. Do you have these forms, have they been faxed?

## 2019-09-17 NOTE — Telephone Encounter (Signed)
Fax number was not on the forms, But thank you I have faxed it now.   Copy sent to scan. Copy mailed to patient.  Patient informed.

## 2019-09-17 NOTE — Telephone Encounter (Signed)
Pt states the fax number is [phone].

## 2019-09-17 NOTE — Telephone Encounter (Signed)
I have a copy of the forms that was in pick up. Do they still need to be faxed?

## 2019-09-17 NOTE — Telephone Encounter (Signed)
New Message:   Pt is asking that we fax the FMLA papers to [facility] and she believes the number is on the paperwork. Please advise.

## 2019-09-18 ENCOUNTER — Other Ambulatory Visit: Payer: Self-pay

## 2019-09-18 ENCOUNTER — Ambulatory Visit (HOSPITAL_COMMUNITY)
Admission: RE | Admit: 2019-09-18 | Discharge: 2019-09-18 | Disposition: A | Discharge status: Home / Self Care | Payer: BC Managed Care – PPO | Source: Ambulatory Visit | Attending: Internal Medicine | Admitting: Internal Medicine

## 2019-09-18 ENCOUNTER — Encounter: Payer: Self-pay | Admitting: Internal Medicine

## 2019-09-18 ENCOUNTER — Telehealth: Payer: Self-pay | Admitting: Internal Medicine

## 2019-09-18 DIAGNOSIS — G459 Transient cerebral ischemic attack, unspecified: Secondary | ICD-10-CM | POA: Diagnosis present

## 2019-09-18 NOTE — Telephone Encounter (Signed)
Pt states when she woke up today her hand was numb and she wasn't feeling well.  Can you please call her to discuss?  thanks

## 2019-09-22 ENCOUNTER — Encounter: Payer: Self-pay | Admitting: Internal Medicine

## 2019-09-22 ENCOUNTER — Other Ambulatory Visit: Payer: Self-pay

## 2019-09-22 ENCOUNTER — Encounter (HOSPITAL_COMMUNITY): Payer: Self-pay | Admitting: Emergency Medicine

## 2019-09-22 ENCOUNTER — Emergency Department (HOSPITAL_COMMUNITY)
Admission: EM | Admit: 2019-09-22 | Discharge: 2019-09-22 | Disposition: A | Discharge status: Home / Self Care | Payer: BC Managed Care – PPO | Attending: Emergency Medicine | Admitting: Emergency Medicine

## 2019-09-22 DIAGNOSIS — R350 Frequency of micturition: Secondary | ICD-10-CM | POA: Diagnosis present

## 2019-09-22 DIAGNOSIS — Z5321 Procedure and treatment not carried out due to patient leaving prior to being seen by health care provider: Secondary | ICD-10-CM | POA: Diagnosis not present

## 2019-09-22 DIAGNOSIS — F419 Anxiety disorder, unspecified: Secondary | ICD-10-CM | POA: Diagnosis not present

## 2019-09-22 DIAGNOSIS — R109 Unspecified abdominal pain: Secondary | ICD-10-CM | POA: Insufficient documentation

## 2019-09-22 LAB — URINALYSIS, ROUTINE W REFLEX MICROSCOPIC
Bilirubin Urine: NEGATIVE
Glucose, UA: NEGATIVE mg/dL
Hgb urine dipstick: NEGATIVE
Ketones, ur: NEGATIVE mg/dL
Leukocytes,Ua: NEGATIVE
Nitrite: NEGATIVE
Protein, ur: NEGATIVE mg/dL
Specific Gravity, Urine: 1.015 (ref 1.005–1.030)
pH: 5 (ref 5.0–8.0)

## 2019-09-22 NOTE — Telephone Encounter (Signed)
Pt is currently in the ED.

## 2019-09-22 NOTE — ED Triage Notes (Signed)
Pt reports having urinary frequency for a week as well as every morning when wakes up is really nervous/anxous. Reports that once she takes anxiety medications the symptoms ease off. Also having left flank pains that started today. Denies dysuria or odors.

## 2019-09-22 NOTE — ED Notes (Signed)
Called X2 vitals

## 2019-09-23 ENCOUNTER — Other Ambulatory Visit: Payer: Self-pay

## 2019-09-23 ENCOUNTER — Encounter: Payer: Self-pay | Admitting: Family

## 2019-09-23 ENCOUNTER — Ambulatory Visit (INDEPENDENT_AMBULATORY_CARE_PROVIDER_SITE_OTHER): Payer: BC Managed Care – PPO | Admitting: Family

## 2019-09-23 ENCOUNTER — Telehealth: Payer: Self-pay

## 2019-09-23 VITALS — BP 138/82 | HR 86 | Temp 98.1°F | Wt 153.2 lb

## 2019-09-23 DIAGNOSIS — R5383 Other fatigue: Secondary | ICD-10-CM | POA: Diagnosis not present

## 2019-09-23 DIAGNOSIS — M5442 Lumbago with sciatica, left side: Secondary | ICD-10-CM | POA: Diagnosis not present

## 2019-09-23 DIAGNOSIS — G8929 Other chronic pain: Secondary | ICD-10-CM

## 2019-09-23 DIAGNOSIS — R7303 Prediabetes: Secondary | ICD-10-CM | POA: Diagnosis not present

## 2019-09-23 MED ORDER — KETOROLAC TROMETHAMINE 30 MG/ML IJ SOLN
30.0000 mg | Freq: Once | INTRAMUSCULAR | Status: AC
  Administered 2019-09-23: 30 mg via INTRAMUSCULAR

## 2019-09-23 MED ORDER — METHYLPREDNISOLONE ACETATE 80 MG/ML IJ SUSP
40.0000 mg | Freq: Once | INTRAMUSCULAR | Status: AC
  Administered 2019-09-23: 40 mg via INTRAMUSCULAR

## 2019-09-23 NOTE — Progress Notes (Signed)
[name] Walker is a 59 y.o. female with the following history as recorded in EpicCare:  Patient Active Problem List   Diagnosis Date Noted  . TIA (transient ischemic attack) 09/15/2019  . HLD (hyperlipidemia) 04/14/2019  . Acute ear pain, bilateral 04/14/2019  . Vitamin D deficiency 04/14/2019  . Anxiety 10/06/2018  . Neck pain 09/10/2018  . Hand swelling 09/10/2018  . Low back pain 06/03/2018  . Bilateral radiating leg pain 04/23/2018  . Myalgia 04/23/2018  . Right renal stone 04/03/2018  . Smoker 04/29/2016  . Bilateral hand pain 04/29/2016  . HNP (herniated nucleus pulposus), cervical 04/07/2016  . Cervical radiculopathy, acute 01/29/2016  . Acute upper respiratory infection 01/29/2016  . Degenerative disc disease, cervical 01/11/2016  . Hyperglycemia 11/17/2015  . Angioedema 10/12/2015  . Bilateral lower extremity edema 10/12/2015  . Mouth sores 10/12/2015  . Hypokalemia 10/12/2015  . Thoracic scoliosis 10/01/2015  . Peripheral edema 10/01/2015  . Eczema 10/01/2015  . Asthma 10/01/2015  . Breast lump on right side at 4 o'clock position 01/21/2014  . Hot flashes 10/01/2013  . Lumbar radiculopathy 07/17/2012  . Grief reaction 07/17/2012  . Family history of colon cancer 07/02/2012  . OAB (overactive bladder) 03/01/2012  . Encounter for well adult exam with abnormal findings 08/23/2010  . Plantar fasciitis 08/23/2010  . Dysuria 01/05/2010  . UTI (urinary tract infection) 10/19/2008  . ANKLE PAIN, BILATERAL 10/19/2008  . VAGINITIS 09/10/2008  . JOINT EFFUSION, ANKLE 09/10/2008  . SUBACROMIAL BURSITIS, RIGHT 09/01/2008  . ACHILLES TENDINITIS 09/01/2008  . LATERAL EPICONDYLITIS, RIGHT 08/18/2008  . Essential hypertension 07/12/2007  . Allergic rhinitis 07/12/2007  . GERD 07/12/2007  . Chronic low back pain 07/12/2007  . NEPHROLITHIASIS, HX OF 07/12/2007    Current Outpatient Medications  Medication Sig Dispense Refill  . acetaminophen (TYLENOL) 500 MG tablet  Take 1,000 mg by mouth every 6 (six) hours as needed for mild pain.    Marland Kitchen albuterol (PROVENTIL HFA;VENTOLIN HFA) 108 (90 Base) MCG/ACT inhaler Inhale 2 puffs into the lungs every 6 (six) hours as needed for wheezing or shortness of breath. 1 Inhaler 11  . amLODipine (NORVASC) 5 MG tablet Take 1 tablet (5 mg total) by mouth daily. 90 tablet 3  . aspirin EC 81 MG tablet Take 1 tablet (81 mg total) by mouth daily. Swallow whole. 30 tablet 11  . atorvastatin (LIPITOR) 10 MG tablet Take 1 tablet (10 mg total) by mouth daily. 90 tablet 3  . Azelastine-Fluticasone (DYMISTA) 137-50 MCG/ACT SUSP Place 1 spray into the nose 2 (two) times daily. 23 g 11  . hydrochlorothiazide (HYDRODIURIL) 25 MG tablet Take 1 tablet (25 mg total) by mouth daily. 90 tablet 3  . LORazepam (ATIVAN) 0.5 MG tablet Take 1 tablet (0.5 mg total) by mouth 2 (two) times daily as needed. for anxiety 60 tablet 0  . losartan (COZAAR) 50 MG tablet Take 1 tablet (50 mg total) by mouth daily. 90 tablet 3  . pantoprazole (PROTONIX) 40 MG tablet Take 1 tablet (40 mg total) by mouth daily. 90 tablet 3  . DULoxetine (CYMBALTA) 30 MG capsule Take 1 capsule (30 mg total) by mouth daily. (Patient not taking: Reported on 09/23/2019) 90 capsule 3  . fluticasone (FLONASE) 50 MCG/ACT nasal spray Place 2 sprays into both nostrils daily. 1 spray in the morning and 1 at night for 14 days. Then resume 1 time daily (Patient not taking: Reported on 09/23/2019) 16 g 2  . ibuprofen (ADVIL) 800 MG tablet Take 1  tablet (800 mg total) by mouth every 8 (eight) hours as needed. (Patient not taking: Reported on 09/23/2019) 30 tablet 0  . tamsulosin (FLOMAX) 0.4 MG CAPS capsule Take 0.4 mg by mouth daily. (Patient not taking: Reported on 09/23/2019)     No current facility-administered medications for this visit.    Allergies: Flexeril [cyclobenzaprine], Lisinopril, Other, Robaxin [methocarbamol], Contrast media [iodinated diagnostic agents], and Gabapentin  Past Medical  History:  Diagnosis Date  . ACHILLES TENDINITIS   . ALLERGIC RHINITIS   . Allergy   . Anginal pain ([place]) 04/04/2016   Pt currently feels like she is having muscle spasms and aching in her chest  . Anxiety   . Asthma   . Chronic kidney disease   . GERD   . History of kidney stones   . HYPERTENSION   . LATERAL EPICONDYLITIS, RIGHT   . LOW BACK PAIN   . Plantar fascial fibromatosis   . SUBACROMIAL BURSITIS, RIGHT   . Thoracic scoliosis 10/01/2015    Past Surgical History:  Procedure Laterality Date  . ANTERIOR CERVICAL DECOMP/DISCECTOMY FUSION N/A 04/07/2016   Procedure: Cervical two-three Anterior cervical decompression/discectomy/fusion;  Surgeon: Ashok Pall, MD;  Location: [facility];  Service: Neurosurgery;  Laterality: N/A;  . BACK SURGERY  2015   lower back  . EXTRACORPOREAL SHOCK WAVE LITHOTRIPSY Left 09/28/2016   Procedure: LEFT EXTRACORPOREAL SHOCK WAVE LITHOTRIPSY (ESWL);  Surgeon: Festus Aloe, MD;  [facility];  Service: Urology;  Laterality: Left;  . EXTRACORPOREAL SHOCK WAVE LITHOTRIPSY Right 04/08/2018   Procedure: EXTRACORPOREAL SHOCK WAVE LITHOTRIPSY (ESWL);  Surgeon: Ardis Hughs, MD;  [facility];  Service: Urology;  Laterality: Right;  . KIDNEY SURGERY    . LITHOTRIPSY  05/2016  . TUBAL LIGATION      Family History  Problem Relation Age of Onset  . Cancer Sister        colon cancer  . Colon cancer Sister   . Diabetes Mother   . Cancer Father        Prostate  . Colon cancer Father   . Breast cancer Maternal Aunt        50's  . Esophageal cancer Neg Hx   . Stomach cancer Neg Hx     Social History   Tobacco Use  . Smoking status: Current Every Day Smoker    Packs/day: 0.50    Years: 20.00    Pack years: 10.00    Types: Cigarettes  . Smokeless tobacco: Never Used  Substance Use Topics  . Alcohol use: No    Subjective:  Complaining of low back pain x 2 days; feels pain radiating into her left lower leg; notes that she does have  chronic back issues;  MRI done in June 2020 did show extensive arthritis changes;  Scheduled to see orthopedist/ PT later this week;  Went to ER with these symptoms yesterday- sat for 8 hours and left without being seen; did have normal U/A;   Also notes that she has been having increased problems with anxiety/ "feel like I wake up shaking"; has already spoken to her PCP and he was concerned for reaction to Cymbalta- has recommended to stop that medication and see him if symptoms persist; has been referred to neurology as well to follow-up from TIA;      Objective:  Vitals:   09/23/19 1047  BP: (!) 138/82  Pulse: 86  Temp: 98.1 F (36.7 C)  TempSrc: Oral  SpO2: 99%  Weight: 153 lb 3.2  oz (69.5 kg)    General: Well developed, well nourished, in no acute distress  Skin : Warm and dry.  Head: Normocephalic and atraumatic  Lungs: Respirations unlabored; clear to auscultation bilaterally without wheeze, rales, rhonchi  CVS exam: normal rate and regular rhythm.  Musculoskeletal: No deformities; no active joint inflammation  Extremities: No edema, cyanosis, clubbing  Vessels: Symmetric bilaterally  Neurologic: Alert and oriented; speech intact; face symmetrical; moves all extremities well; CNII-XII intact without focal deficit   Assessment:  1. Chronic left-sided low back pain with left-sided sciatica   2. Other fatigue   3. Pre-diabetes     Plan:  1. Discussed that our X-ray is not available today- if symptoms persist, she will go to [facility] for updated [facility]; keep planned PT/ ortho follow-up that has already been scheduled; will give Toradol and Depo-Medrol today; follow-up as needed if pain persists; 2. Patient is not shaking or trembling in office today; physical exam is reassuring; will update labs; if symptoms persist, she needs to follow up with her PCP; 3. Check Hgba1c today;  This visit occurred during the SARS-CoV-2 public health emergency.  Safety protocols were in place,  including screening questions prior to the visit, additional usage of staff PPE, and extensive cleaning of exam room while observing appropriate contact time as indicated for disinfecting solutions.     No follow-ups on file.  Orders Placed This Encounter  Procedures  . DG Lumbar Spine 2-3 Views    Standing Status:   Future    Standing Expiration Date:   09/22/2020    Order Specific Question:   Reason for Exam (SYMPTOM  OR DIAGNOSIS REQUIRED)    Answer:   low back pain    Order Specific Question:   Is patient pregnant?    Answer:   No    Order Specific Question:   Preferred imaging location?    Answer:   [facility]    Order Specific Question:   Radiology Contrast Protocol - do NOT remove file path    Answer:   [email or web address]  . CBC with Differential/Platelet    Standing Status:   Future    Number of Occurrences:   1    Standing Expiration Date:   09/22/2020  . Comp Met (CMET)    Standing Status:   Future    Number of Occurrences:   1    Standing Expiration Date:   09/22/2020  . Hemoglobin A1c    Standing Status:   Future    Number of Occurrences:   1    Standing Expiration Date:   09/22/2020  . TSH    Standing Status:   Future    Number of Occurrences:   1    Standing Expiration Date:   09/22/2020    Requested Prescriptions    No prescriptions requested or ordered in this encounter

## 2019-09-23 NOTE — Telephone Encounter (Signed)
I tried calling pt to clarify what type of document was needed

## 2019-09-23 NOTE — Telephone Encounter (Signed)
New message    The patient calling voiced her job need something on letterhead with Dr. Jenny Reichmann office address & phone number.   Upcoming appt with Jodi Mourning today @ 10:40am

## 2019-09-24 ENCOUNTER — Encounter: Payer: Self-pay | Admitting: Nurse Practitioner

## 2019-09-24 ENCOUNTER — Telehealth: Payer: Self-pay

## 2019-09-24 ENCOUNTER — Ambulatory Visit: Payer: BC Managed Care – PPO | Admitting: Nurse Practitioner

## 2019-09-24 VITALS — BP 126/80 | Ht 60.0 in | Wt 152.0 lb

## 2019-09-24 DIAGNOSIS — Z780 Asymptomatic menopausal state: Secondary | ICD-10-CM

## 2019-09-24 DIAGNOSIS — Z01419 Encounter for gynecological examination (general) (routine) without abnormal findings: Secondary | ICD-10-CM | POA: Diagnosis not present

## 2019-09-24 DIAGNOSIS — R1030 Lower abdominal pain, unspecified: Secondary | ICD-10-CM

## 2019-09-24 LAB — CBC WITH DIFFERENTIAL/PLATELET
Absolute Monocytes: 510 cells/uL (ref 200–950)
Basophils Absolute: 30 cells/uL (ref 0–200)
Basophils Relative: 0.5 %
Eosinophils Absolute: 132 cells/uL (ref 15–500)
Eosinophils Relative: 2.2 %
HCT: 44.5 % (ref 35.0–45.0)
Hemoglobin: 14.4 g/dL (ref 11.7–15.5)
Lymphs Abs: 2346 cells/uL (ref 850–3900)
MCH: 27.8 pg (ref 27.0–33.0)
MCHC: 32.4 g/dL (ref 32.0–36.0)
MCV: 85.9 fL (ref 80.0–100.0)
MPV: 11.6 fL (ref 7.5–12.5)
Monocytes Relative: 8.5 %
Neutro Abs: 2982 cells/uL (ref 1500–7800)
Neutrophils Relative %: 49.7 %
Platelets: 175 10*3/uL (ref 140–400)
RBC: 5.18 10*6/uL — ABNORMAL HIGH (ref 3.80–5.10)
RDW: 14 % (ref 11.0–15.0)
Total Lymphocyte: 39.1 %
WBC: 6 10*3/uL (ref 3.8–10.8)

## 2019-09-24 LAB — COMPREHENSIVE METABOLIC PANEL
AG Ratio: 1.8 (calc) (ref 1.0–2.5)
ALT: 25 U/L (ref 6–29)
AST: 17 U/L (ref 10–35)
Albumin: 4.4 g/dL (ref 3.6–5.1)
Alkaline phosphatase (APISO): 129 U/L (ref 37–153)
BUN: 15 mg/dL (ref 7–25)
CO2: 22 mmol/L (ref 20–32)
Calcium: 9.9 mg/dL (ref 8.6–10.4)
Chloride: 110 mmol/L (ref 98–110)
Creat: 0.58 mg/dL (ref 0.50–1.05)
Globulin: 2.5 g/dL (calc) (ref 1.9–3.7)
Glucose, Bld: 95 mg/dL (ref 65–99)
Potassium: 3.9 mmol/L (ref 3.5–5.3)
Sodium: 142 mmol/L (ref 135–146)
Total Bilirubin: 0.5 mg/dL (ref 0.2–1.2)
Total Protein: 6.9 g/dL (ref 6.1–8.1)

## 2019-09-24 LAB — HEMOGLOBIN A1C
Hgb A1c MFr Bld: 5.3 % of total Hgb (ref <5.7)
Mean Plasma Glucose: 105 (calc)
eAG (mmol/L): 5.8 (calc)

## 2019-09-24 LAB — TSH: TSH: 0.47 mIU/L (ref 0.40–4.50)

## 2019-09-24 NOTE — Patient Instructions (Signed)

## 2019-09-24 NOTE — Telephone Encounter (Signed)
I called patient and read her Tiffany, NP's  Message to her: "[name] Walker, I spoke with Dr. Delilah Shan about Dr. Zelphia Cairo recommendation to try to open up the cervix to see if the fluid from your uterus will drain. He says this is an option but there is the possibility that the fluid does not drain. It is also a painful procedure so make sure to take Tylenol prior to coming into the appointment. One of our schedulers will call you to set this up. Let me know if you have any questions. Great to meet you today.  Tiffany  Appt was scheduled with Dr. Delilah Shan for 10/16/19.

## 2019-09-24 NOTE — Progress Notes (Signed)
   [name] Walker [facility] [birth year] [id number]   History:  59 y.o. G3P3 presents for annual exam with complaints of lower abdominal pain. She describes the pain as bilateral, lower, intermittent, throbbing, and relieved with tylenol. This is not new for her. She was evaluated for this March 2020 where a fluid-filled endometrium 21 x 1 x 20 mm was found on ultrasound and cervical dilation was discussed if pain continued. She would like to have this done. Postmenopausal - no HRT, no bleeding. TIA three weeks ago with full resolution of symptoms. She has appointment with cardiology next week.   Gynecologic History Patient's last menstrual period was 08/22/2010.   Contraception: post menopausal status Last Pap: 10/02/2017. Results were: normal Last mammogram: 10/28/2018. Results were: normal Last colonoscopy: 11/20/2017. Results were: polyps Last Dexa: Never  Past medical history, past surgical history, family history and social history were all reviewed and documented in the EPIC chart.  ROS:  A ROS was performed and pertinent positives and negatives are included.  Exam:  Vitals:   09/24/19 0811  BP: 126/80  Weight: 152 lb (68.9 kg)  Height: 5' (1.524 m)   Body mass index is 29.69 kg/m.  General appearance:  Normal Thyroid:  Symmetrical, normal in size, without palpable masses or nodularity. Respiratory  Auscultation:  Clear without wheezing or rhonchi Cardiovascular  Auscultation:  Regular rate, without rubs, murmurs or gallops  Edema/varicosities:  Not grossly evident Abdominal  Soft,nontender, without masses, guarding or rebound.  Liver/spleen:  No organomegaly noted  Hernia:  None appreciated  Skin  Inspection:  Grossly normal   Breasts: Examined lying and sitting.   Right: Without masses, retractions, discharge or axillary adenopathy.   Left: Without masses, retractions, discharge or axillary adenopathy. Gentitourinary   Inguinal/mons:  Normal without inguinal  adenopathy  External genitalia:  Normal  BUS/Urethra/Skene's glands:  Normal  Vagina:  Normal  Cervix:  Normal  Uterus:  Anteverted, normal in size, shape and contour.  Midline and mobile. Mild TTP of left side with digital exam.   Adnexa/parametria:     Rt: Without masses or tenderness.   Lt: Without masses or tenderness.  Anus and perineum: Normal  Digital rectal exam: Normal sphincter tone without palpated masses or tenderness  Assessment/Plan:  59 y.o. G3P3 for annual exam.   Well female exam with routine gynecological exam - Education provided on SBEs, importance of preventative screenings, current guidelines, high calcium diet, regular exercise, and multivitamin daily.   Postmenopausal - Plan: DG Bone Density  Lower abdominal pain - Pain most likely from fluid-filled endometrium. She would like to have a cervical dilation done. I will discuss with Dr. Delilah Shan and schedule this if appropriate. Tylenol as needed for pain.  Follow up in 1 year for annual       [place], 8:30 AM 09/24/2019

## 2019-09-27 ENCOUNTER — Ambulatory Visit (HOSPITAL_COMMUNITY)
Admission: EM | Admit: 2019-09-27 | Discharge: 2019-09-27 | Disposition: A | Discharge status: Home / Self Care | Payer: BC Managed Care – PPO

## 2019-09-27 ENCOUNTER — Emergency Department (HOSPITAL_COMMUNITY): Payer: BC Managed Care – PPO

## 2019-09-27 ENCOUNTER — Other Ambulatory Visit: Payer: Self-pay

## 2019-09-27 ENCOUNTER — Emergency Department (HOSPITAL_COMMUNITY)
Admission: EM | Admit: 2019-09-27 | Discharge: 2019-09-27 | Disposition: A | Discharge status: Home / Self Care | Payer: BC Managed Care – PPO | Attending: Emergency Medicine | Admitting: Emergency Medicine

## 2019-09-27 ENCOUNTER — Encounter (HOSPITAL_COMMUNITY): Payer: Self-pay | Admitting: Emergency Medicine

## 2019-09-27 DIAGNOSIS — F17210 Nicotine dependence, cigarettes, uncomplicated: Secondary | ICD-10-CM | POA: Diagnosis not present

## 2019-09-27 DIAGNOSIS — J45909 Unspecified asthma, uncomplicated: Secondary | ICD-10-CM | POA: Diagnosis not present

## 2019-09-27 DIAGNOSIS — H9202 Otalgia, left ear: Secondary | ICD-10-CM | POA: Insufficient documentation

## 2019-09-27 DIAGNOSIS — N189 Chronic kidney disease, unspecified: Secondary | ICD-10-CM | POA: Diagnosis not present

## 2019-09-27 DIAGNOSIS — Z79899 Other long term (current) drug therapy: Secondary | ICD-10-CM | POA: Insufficient documentation

## 2019-09-27 DIAGNOSIS — R200 Anesthesia of skin: Secondary | ICD-10-CM | POA: Insufficient documentation

## 2019-09-27 DIAGNOSIS — I129 Hypertensive chronic kidney disease with stage 1 through stage 4 chronic kidney disease, or unspecified chronic kidney disease: Secondary | ICD-10-CM | POA: Insufficient documentation

## 2019-09-27 LAB — URINALYSIS, ROUTINE W REFLEX MICROSCOPIC
Bilirubin Urine: NEGATIVE
Glucose, UA: NEGATIVE mg/dL
Hgb urine dipstick: NEGATIVE
Ketones, ur: NEGATIVE mg/dL
Leukocytes,Ua: NEGATIVE
Nitrite: NEGATIVE
Protein, ur: NEGATIVE mg/dL
Specific Gravity, Urine: 1.017 (ref 1.005–1.030)
pH: 7 (ref 5.0–8.0)

## 2019-09-27 LAB — COMPREHENSIVE METABOLIC PANEL
ALT: 27 U/L (ref 0–44)
AST: 24 U/L (ref 15–41)
Albumin: 3.9 g/dL (ref 3.5–5.0)
Alkaline Phosphatase: 130 U/L — ABNORMAL HIGH (ref 38–126)
Anion gap: 9 (ref 5–15)
BUN: 17 mg/dL (ref 6–20)
CO2: 27 mmol/L (ref 22–32)
Calcium: 9.9 mg/dL (ref 8.9–10.3)
Chloride: 108 mmol/L (ref 98–111)
Creatinine, Ser: 0.84 mg/dL (ref 0.44–1.00)
GFR calc Af Amer: >60 mL/min (ref >60)
GFR calc non Af Amer: >60 mL/min (ref >60)
Glucose, Bld: 177 mg/dL — ABNORMAL HIGH (ref 70–99)
Potassium: 4 mmol/L (ref 3.5–5.1)
Sodium: 144 mmol/L (ref 135–145)
Total Bilirubin: 0.7 mg/dL (ref 0.3–1.2)
Total Protein: 7.1 g/dL (ref 6.5–8.1)

## 2019-09-27 LAB — CBC
HCT: 43.4 % (ref 36.0–46.0)
Hemoglobin: 13.7 g/dL (ref 12.0–15.0)
MCH: 28.2 pg (ref 26.0–34.0)
MCHC: 31.6 g/dL (ref 30.0–36.0)
MCV: 89.3 fL (ref 80.0–100.0)
Platelets: 179 10*3/uL (ref 150–400)
RBC: 4.86 MIL/uL (ref 3.87–5.11)
RDW: 15.3 % (ref 11.5–15.5)
WBC: 6.9 10*3/uL (ref 4.0–10.5)
nRBC: 0 % (ref 0.0–0.2)

## 2019-09-27 LAB — I-STAT CHEM 8, ED
BUN: 19 mg/dL (ref 6–20)
Calcium, Ion: 1.18 mmol/L (ref 1.15–1.40)
Chloride: 108 mmol/L (ref 98–111)
Creatinine, Ser: 0.7 mg/dL (ref 0.44–1.00)
Glucose, Bld: 168 mg/dL — ABNORMAL HIGH (ref 70–99)
HCT: 46 % (ref 36.0–46.0)
Hemoglobin: 15.6 g/dL — ABNORMAL HIGH (ref 12.0–15.0)
Potassium: 3.8 mmol/L (ref 3.5–5.1)
Sodium: 147 mmol/L — ABNORMAL HIGH (ref 135–145)
TCO2: 25 mmol/L (ref 22–32)

## 2019-09-27 LAB — PROTIME-INR
INR: 1 (ref 0.8–1.2)
Prothrombin Time: 12.3 seconds (ref 11.4–15.2)

## 2019-09-27 LAB — DIFFERENTIAL
Abs Immature Granulocytes: 0.05 10*3/uL (ref 0.00–0.07)
Basophils Absolute: 0 10*3/uL (ref 0.0–0.1)
Basophils Relative: 0 %
Eosinophils Absolute: 0 10*3/uL (ref 0.0–0.5)
Eosinophils Relative: 0 %
Immature Granulocytes: 1 %
Lymphocytes Relative: 14 %
Lymphs Abs: 0.9 10*3/uL (ref 0.7–4.0)
Monocytes Absolute: 0.3 10*3/uL (ref 0.1–1.0)
Monocytes Relative: 4 %
Neutro Abs: 5.7 10*3/uL (ref 1.7–7.7)
Neutrophils Relative %: 81 %

## 2019-09-27 LAB — I-STAT BETA HCG BLOOD, ED (MC, WL, AP ONLY): I-stat hCG, quantitative: <5 m[IU]/mL (ref <5)

## 2019-09-27 LAB — APTT: aPTT: 25 seconds (ref 24–36)

## 2019-09-27 LAB — RAPID URINE DRUG SCREEN, HOSP PERFORMED
Amphetamines: NOT DETECTED
Barbiturates: NOT DETECTED
Benzodiazepines: NOT DETECTED
Cocaine: NOT DETECTED
Opiates: NOT DETECTED
Tetrahydrocannabinol: NOT DETECTED

## 2019-09-27 LAB — CBG MONITORING, ED: Glucose-Capillary: 173 mg/dL — ABNORMAL HIGH (ref 70–99)

## 2019-09-27 LAB — ETHANOL: Alcohol, Ethyl (B): <10 mg/dL (ref <10)

## 2019-09-27 MED ORDER — DIPHENHYDRAMINE HCL 25 MG PO CAPS: 50.0000 mg | ORAL_CAPSULE | Freq: Once | ORAL | Status: AC

## 2019-09-27 MED ORDER — HYDROCODONE-ACETAMINOPHEN 5-325 MG PO TABS
1.0000 | ORAL_TABLET | Freq: Once | ORAL | Status: AC
  Administered 2019-09-27: 1 via ORAL
  Filled 2019-09-27: qty 1

## 2019-09-27 MED ORDER — HYDROCODONE-ACETAMINOPHEN 5-325 MG PO TABS: 1.0000 | ORAL_TABLET | ORAL | 0 refills | Status: DC | PRN

## 2019-09-27 MED ORDER — IOHEXOL 350 MG/ML SOLN
75.0000 mL | Freq: Once | INTRAVENOUS | Status: AC | PRN
  Administered 2019-09-27: 75 mL via INTRAVENOUS

## 2019-09-27 MED ORDER — HYDROCORTISONE NA SUCCINATE PF 250 MG IJ SOLR
200.0000 mg | Freq: Once | INTRAMUSCULAR | Status: AC
  Administered 2019-09-27: 200 mg via INTRAVENOUS
  Filled 2019-09-27: qty 200

## 2019-09-27 MED ORDER — DIPHENHYDRAMINE HCL 50 MG/ML IJ SOLN
50.0000 mg | Freq: Once | INTRAMUSCULAR | Status: AC
  Administered 2019-09-27: 50 mg via INTRAVENOUS
  Filled 2019-09-27: qty 1

## 2019-09-27 NOTE — ED Notes (Signed)
Pt able to eat and drink, no sign of nausea or vomiting.

## 2019-09-27 NOTE — ED Provider Notes (Signed)
[facility] EMERGENCY DEPARTMENT Provider Note   CSN: [id number] Arrival date & time: 09/27/19  1247     History Chief Complaint  Patient presents with   Otalgia   Numbness   Code Stroke    [name] Walker is a 59 y.o. female.  HPI She presents for evaluation of left ear pain present for 1 week.  She was seen at triage by nursing who felt she was having a stroke because her speech was abnormal.  I saw her at 1:28 PM, at which time she was sitting in wheelchair, at the nursing station.  She was able to communicate and states that her speech has been abnormal since she had a stroke, 2 weeks ago, while she was in [place].  Since then she has seen her PCP who ordered some outpatient testing to be evaluated for a stroke.  She states she has a history of anxiety.  She denies other recent illnesses.  There are no other known modifying factors.  Patient saw her PCP, and had an brain MRI done, 10 days ago that did not show infarct.  An echocardiogram has been ordered, but is not yet done.    Past Medical History:  Diagnosis Date   ACHILLES TENDINITIS    ALLERGIC RHINITIS    Allergy    Anginal pain ([place]) 04/04/2016   Pt currently feels like she is having muscle spasms and aching in her chest   Anxiety    Asthma    Chronic kidney disease    GERD    History of kidney stones    HYPERTENSION    LATERAL EPICONDYLITIS, RIGHT    LOW BACK PAIN    Plantar fascial fibromatosis    SUBACROMIAL BURSITIS, RIGHT    Thoracic scoliosis 10/01/2015    Patient Active Problem List   Diagnosis Date Noted   TIA (transient ischemic attack) 09/15/2019   HLD (hyperlipidemia) 04/14/2019   Acute ear pain, bilateral 04/14/2019   Vitamin D deficiency 04/14/2019   Anxiety 10/06/2018   Neck pain 09/10/2018   Hand swelling 09/10/2018   Low back pain 06/03/2018   Bilateral radiating leg pain 04/23/2018   Myalgia 04/23/2018   Right renal stone 04/03/2018    Smoker 04/29/2016   Bilateral hand pain 04/29/2016   HNP (herniated nucleus pulposus), cervical 04/07/2016   Cervical radiculopathy, acute 01/29/2016   Acute upper respiratory infection 01/29/2016   Degenerative disc disease, cervical 01/11/2016   Hyperglycemia 11/17/2015   Angioedema 10/12/2015   Bilateral lower extremity edema 10/12/2015   Mouth sores 10/12/2015   Hypokalemia 10/12/2015   Thoracic scoliosis 10/01/2015   Peripheral edema 10/01/2015   Eczema 10/01/2015   Asthma 10/01/2015   Breast lump on right side at 4 o'clock position 01/21/2014   Hot flashes 10/01/2013   Lumbar radiculopathy 07/17/2012   Grief reaction 07/17/2012   Family history of colon cancer 07/02/2012   OAB (overactive bladder) 03/01/2012   Encounter for well adult exam with abnormal findings 08/23/2010   Plantar fasciitis 08/23/2010   Dysuria 01/05/2010   UTI (urinary tract infection) 10/19/2008   ANKLE PAIN, BILATERAL 10/19/2008   VAGINITIS 09/10/2008   JOINT EFFUSION, ANKLE 09/10/2008   SUBACROMIAL BURSITIS, RIGHT 09/01/2008   ACHILLES TENDINITIS 09/01/2008   LATERAL EPICONDYLITIS, RIGHT 08/18/2008   Essential hypertension 07/12/2007   Allergic rhinitis 07/12/2007   GERD 07/12/2007   Chronic low back pain 07/12/2007   NEPHROLITHIASIS, HX OF 07/12/2007    Past Surgical History:  Procedure Laterality Date  ANTERIOR CERVICAL DECOMP/DISCECTOMY FUSION N/A 04/07/2016   Procedure: Cervical two-three Anterior cervical decompression/discectomy/fusion;  Surgeon: Ashok Pall, MD;  Location: [facility];  Service: Neurosurgery;  Laterality: N/A;   BACK SURGERY  2015   lower back   EXTRACORPOREAL SHOCK WAVE LITHOTRIPSY Left 09/28/2016   Procedure: LEFT EXTRACORPOREAL SHOCK WAVE LITHOTRIPSY (ESWL);  Surgeon: Festus Aloe, MD;  Location: [facility];  Service: Urology;  Laterality: Left;   EXTRACORPOREAL SHOCK WAVE LITHOTRIPSY Right 04/08/2018   Procedure: EXTRACORPOREAL SHOCK  WAVE LITHOTRIPSY (ESWL);  Surgeon: Ardis Hughs, MD;  Location: [facility];  Service: Urology;  Laterality: Right;   KIDNEY SURGERY     LITHOTRIPSY  05/2016   TUBAL LIGATION       OB History    Gravida  3   Para  3   Term      Preterm      AB      Living  3     SAB      TAB      Ectopic      Multiple      Live Births              Family History  Problem Relation Age of Onset   Cancer Sister        colon cancer   Colon cancer Sister    Diabetes Mother    Cancer Father        Prostate   Colon cancer Father    Breast cancer Maternal Aunt        50's   Esophageal cancer Neg Hx    Stomach cancer Neg Hx     Social History   Tobacco Use   Smoking status: Current Every Day Smoker    Packs/day: 0.50    Years: 20.00    Pack years: 10.00    Types: Cigarettes   Smokeless tobacco: Never Used  Vaping Use   Vaping Use: Never used  Substance Use Topics   Alcohol use: No   Drug use: No    Home Medications Prior to Admission medications   Medication Sig Start Date End Date Taking? Authorizing Provider  acetaminophen (TYLENOL) 500 MG tablet Take 1,000 mg by mouth every 6 (six) hours as needed for mild pain.   Yes [provider]  albuterol (PROVENTIL HFA;VENTOLIN HFA) 108 (90 Base) MCG/ACT inhaler Inhale 2 puffs into the lungs every 6 (six) hours as needed for wheezing or shortness of breath. 05/14/18  Yes Biagio Borg, MD  amLODipine (NORVASC) 5 MG tablet Take 1 tablet (5 mg total) by mouth daily. 04/14/19  Yes Biagio Borg, MD  aspirin EC 81 MG tablet Take 1 tablet (81 mg total) by mouth daily. Swallow whole. 09/15/19  Yes Biagio Borg, MD  atorvastatin (LIPITOR) 10 MG tablet Take 1 tablet (10 mg total) by mouth daily. 09/15/19  Yes Biagio Borg, MD  cetirizine (ZYRTEC) 10 MG tablet Take 10 mg by mouth daily as needed for allergies.   Yes [provider]  LORazepam (ATIVAN) 0.5 MG tablet Take 1 tablet (0.5 mg total) by  mouth 2 (two) times daily as needed. for anxiety 09/09/19  Yes Biagio Borg, MD  pantoprazole (PROTONIX) 40 MG tablet Take 1 tablet (40 mg total) by mouth daily. 04/14/19  Yes Biagio Borg, MD  predniSONE (DELTASONE) 20 MG tablet Take 60 mg by mouth daily. 1 day remaining 09/27/19 09/25/19  Yes [provider]  Azelastine-Fluticasone (DYMISTA) 137-50 MCG/ACT SUSP  Place 1 spray into the nose 2 (two) times daily. Patient not taking: Reported on 09/27/2019 06/08/18   Luetta Nutting, DO  DULoxetine (CYMBALTA) 30 MG capsule Take 1 capsule (30 mg total) by mouth daily. Patient not taking: Reported on 09/27/2019 09/15/19   Biagio Borg, MD  fluticasone [facility]) 50 MCG/ACT nasal spray Place 2 sprays into both nostrils daily. 1 spray in the morning and 1 at night for 14 days. Then resume 1 time daily Patient not taking: Reported on 09/27/2019 04/01/19   Darr, Marguerita Beards, PA-C  hydrochlorothiazide (HYDRODIURIL) 25 MG tablet Take 1 tablet (25 mg total) by mouth daily. Patient not taking: Reported on 09/27/2019 09/06/17   Biagio Borg, MD  ibuprofen (ADVIL) 800 MG tablet Take 1 tablet (800 mg total) by mouth every 8 (eight) hours as needed. Patient not taking: Reported on 09/27/2019 08/26/19   Biagio Borg, MD  losartan (COZAAR) 50 MG tablet Take 1 tablet (50 mg total) by mouth daily. Patient not taking: Reported on 09/27/2019 04/14/19   Biagio Borg, MD  tamsulosin (FLOMAX) 0.4 MG CAPS capsule Take 0.4 mg by mouth daily.  Patient not taking: Reported on 09/27/2019 04/01/18   [provider]    Allergies    Flexeril [cyclobenzaprine], Lisinopril, Other, Robaxin [methocarbamol], Contrast media [iodinated diagnostic agents], and Gabapentin  Review of Systems   Review of Systems  All other systems reviewed and are negative.   Physical Exam Updated Vital Signs BP (!) 152/83    Pulse 85    Temp 98 F (36.7 C) (Oral)    Resp 18    Ht 5' 3" (1.6 m)    Wt 68 kg    LMP 08/22/2010    SpO2 99%    BMI 26.57  kg/m   Physical Exam Vitals and nursing note reviewed.  Constitutional:      Appearance: She is well-developed.  HENT:     Head: Normocephalic and atraumatic.     Right Ear: External ear normal.     Left Ear: External ear normal.  Eyes:     Conjunctiva/sclera: Conjunctivae normal.     Pupils: Pupils are equal, round, and reactive to light.  Neck:     Trachea: Phonation normal.  Cardiovascular:     Rate and Rhythm: Normal rate and regular rhythm.     Heart sounds: Normal heart sounds.  Pulmonary:     Effort: Pulmonary effort is normal.     Breath sounds: Normal breath sounds.  Abdominal:     General: There is no distension.  Musculoskeletal:        General: Normal range of motion.     Cervical back: Normal range of motion and neck supple.  Skin:    General: Skin is warm and dry.  Neurological:     Mental Status: She is alert and oriented to person, place, and time.     Cranial Nerves: No cranial nerve deficit.     Sensory: No sensory deficit.     Motor: No abnormal muscle tone.     Coordination: Coordination normal.     Comments: Speaks with a halting voice, alert and responsive.  No dysarthria or aphasia.  Psychiatric:        Behavior: Behavior normal.        Thought Content: Thought content normal.        Judgment: Judgment normal.     Comments: Sad, tearful     ED Results / Procedures / Treatments  Labs (all labs ordered are listed, but only abnormal results are displayed) Labs Reviewed  COMPREHENSIVE METABOLIC PANEL - Abnormal; Notable for the following components:      Result Value   Glucose, Bld 177 (*)    Alkaline Phosphatase 130 (*)    All other components within normal limits  URINALYSIS, ROUTINE W REFLEX MICROSCOPIC - Abnormal; Notable for the following components:   APPearance CLOUDY (*)    All other components within normal limits  I-STAT CHEM 8, ED - Abnormal; Notable for the following components:   Sodium 147 (*)    Glucose, Bld 168 (*)     Hemoglobin 15.6 (*)    All other components within normal limits  CBG MONITORING, ED - Abnormal; Notable for the following components:   Glucose-Capillary 173 (*)    All other components within normal limits  ETHANOL  PROTIME-INR  APTT  CBC  DIFFERENTIAL  RAPID URINE DRUG SCREEN, HOSP PERFORMED  I-STAT BETA HCG BLOOD, ED ([facility], [facility], [facility] ONLY)    EKG EKG Interpretation  Date/Time:  Saturday September 27 2019 14:39:15 EDT Ventricular Rate:  77 PR Interval:    QRS Duration: 90 QT Interval:  379 QTC Calculation: 429 R Axis:   49 Text Interpretation: Sinus rhythm RSR' in V1 or V2, probably normal variant Borderline repolarization abnormality since last tracing no significant change Confirmed by Daleen Bo [id number]) on 09/27/2019 3:11:16 PM   Radiology CT HEAD WO CONTRAST  Result Date: 09/27/2019 CLINICAL DATA:  Ear pain.  History of stroke EXAM: CT HEAD WITHOUT CONTRAST TECHNIQUE: Contiguous axial images were obtained from the base of the skull through the vertex without intravenous contrast. COMPARISON:  09/17/2019 FINDINGS: Brain: No evidence of acute infarction, hemorrhage, hydrocephalus, extra-axial collection or mass lesion/mass effect. 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 Vascular: Mild atherosclerotic calcifications involving the large vessels of the skull base. No unexpected hyperdense vessel. Skull: Normal. Negative for fracture or focal lesion. Sinuses/Orbits: No acute finding. Other: None. IMPRESSION: No acute intracranial findings. Electronically Signed   By: Davina Poke D.O.   On: 09/27/2019 14:36    Procedures Procedures (including critical care time)  Medications Ordered in ED Medications  diphenhydrAMINE (BENADRYL) capsule 50 mg (has no administration in time range)    Or  diphenhydrAMINE (BENADRYL) injection 50 mg (has no administration in time range)  HYDROcodone-acetaminophen (NORCO/VICODIN) 5-325 MG per tablet 1 tablet (has no administration in time  range)  hydrocortisone sodium succinate (SOLU-CORTEF) injection 200 mg (200 mg Intravenous Given 09/27/19 1422)    ED Course  I have reviewed the triage vital signs and the nursing notes.  Pertinent labs & imaging results that were available during my care of the patient were reviewed by me and considered in my medical decision making (see chart for details).  Clinical Course as of Sep 27 1911  Sat Sep 27, 2019  1530 Per radiologist, no acute abnormality.  CT HEAD WO CONTRAST [EW]  4008 Normal  Urinalysis, Routine w reflex microscopic(!) [EW]  1531 Normal  I-Stat beta hCG blood, ED [EW]  1531 Normal except glucose high, alk phos high  Comprehensive metabolic panel(!) [EW]  6761 Normal except glucose high, hemoglobin  I-stat chem 8, ED(!) [EW]  1531 Normal  Ethanol [EW]  1531 Normal  Differential [EW]  1542 At this time the patient is alert, and cooperative.  She is speaking normally without any speech abnormality.  Her son is with her, currently.  She continues to complain of left ear  pain.  She understands that we are waiting completion of the evaluation with CT angiogram imaging.  Patient has received medications to treat for IV contrast allergy/itching.   [EW]  1908 CT angiogram images of head and neck, per radiologist no acute abnormalities.   [EW]    Clinical Course User Index [EW] Daleen Bo, MD   MDM Rules/Calculators/A&P                           Patient Vitals for the past 24 hrs:  BP Temp Temp src Pulse Resp SpO2 Height Weight  09/27/19 1343 (!) 152/83 -- -- 85 18 99 % -- --  09/27/19 1301 (!) 130/102 98 F (36.7 C) Oral 89 16 99 % 5' 3" (1.6 m) 68 kg    7:13 PM Reevaluation with update and discussion. After initial assessment and treatment, an updated evaluation reveals she is comfortable, eating and has no further complaints.  She is speaking well and denies headache at this time.  She states she has a follow-up appointment with a neurologist, in 3 days  time. Daleen Bo   Medical Decision Making:  This patient is presenting for evaluation of pain in left ear and difficulty talking, which does require a range of treatment options, and is a complaint that involves a high risk of morbidity and mortality. The differential diagnoses include CVA, acute ear infection, mastoiditis. I decided to review old records, and in summary middle-aged female, with reported stroke 2 weeks ago to outlying hospital, presenting with ear pain and nonspecific difficulty speaking.  I obtained additional historical information from her son at the bedside.  Clinical Laboratory Tests Ordered, included CBC, Metabolic panel and Urinalysis. Review indicates no acute abnormalities, UDS negative.. Radiologic Tests Ordered, included CT head, CTA head and neck.  I independently Visualized: Radiographic images, which show no acute abnormalities  Cardiac Monitor Tracing which shows normal sinus rhythm    Critical Interventions-clinical evaluation, laboratory testing, discussion with neuro hospitalist service, CT imaging, advanced CT imaging, observation reassessment  After These Interventions, the Patient was reevaluated and was found stable for discharge.  No clinical radiographic evidence for acute ear disorders.  No evidence for vascular cranial or neck abnormalities, no evidence for stroke.  No indication for further ED evaluation or intervention.  CRITICAL CARE-yes Performed by: Daleen Bo  Nursing Notes Reviewed/ Care Coordinated Applicable Imaging Reviewed Interpretation of Laboratory Data incorporated into ED treatment  The patient appears reasonably screened and/or stabilized for discharge and I doubt any other medical condition or other [facility] requiring further screening, evaluation, or treatment in the ED at this time prior to discharge.  Plan: Home Medications-continue usual; Home Treatments-heat to affected area; return here if the recommended treatment,  does not improve the symptoms; Recommended follow up-PCP, as needed.  Neurology, as scheduled.     Final Clinical Impression(s) / ED Diagnoses Final diagnoses:  Left ear pain    Rx / DC Orders ED Discharge Orders    None       Daleen Bo, MD 09/27/19 [place]

## 2019-09-27 NOTE — Discharge Instructions (Addendum)
There was no sign of infection to your ear.  We also checked you for vascular and stroke problems, and those tests were normal.  Try using heating pad on the sore area.  We prescribed a few narcotic pain relievers, to treat your discomfort.

## 2019-09-27 NOTE — ED Triage Notes (Signed)
Pt. Stated, my ear pain started last week and my arm and jaw started this morning feeling funny. I had a stroke on July 19.

## 2019-09-27 NOTE — ED Notes (Signed)
Patient transported to CT 

## 2019-09-27 NOTE — ED Notes (Signed)
Pt ambulated in hall and back to room

## 2019-09-30 ENCOUNTER — Emergency Department (HOSPITAL_COMMUNITY)
Admission: EM | Admit: 2019-09-30 | Discharge: 2019-09-30 | Disposition: A | Discharge status: Home / Self Care | Payer: BC Managed Care – PPO | Attending: Emergency Medicine | Admitting: Emergency Medicine

## 2019-09-30 ENCOUNTER — Other Ambulatory Visit: Payer: Self-pay

## 2019-09-30 ENCOUNTER — Ambulatory Visit: Payer: BC Managed Care – PPO | Admitting: Physical Therapy

## 2019-09-30 ENCOUNTER — Emergency Department (HOSPITAL_COMMUNITY): Payer: BC Managed Care – PPO

## 2019-09-30 DIAGNOSIS — N189 Chronic kidney disease, unspecified: Secondary | ICD-10-CM | POA: Insufficient documentation

## 2019-09-30 DIAGNOSIS — Z7951 Long term (current) use of inhaled steroids: Secondary | ICD-10-CM | POA: Insufficient documentation

## 2019-09-30 DIAGNOSIS — I129 Hypertensive chronic kidney disease with stage 1 through stage 4 chronic kidney disease, or unspecified chronic kidney disease: Secondary | ICD-10-CM | POA: Insufficient documentation

## 2019-09-30 DIAGNOSIS — Z79899 Other long term (current) drug therapy: Secondary | ICD-10-CM | POA: Diagnosis not present

## 2019-09-30 DIAGNOSIS — F419 Anxiety disorder, unspecified: Secondary | ICD-10-CM | POA: Insufficient documentation

## 2019-09-30 DIAGNOSIS — F17210 Nicotine dependence, cigarettes, uncomplicated: Secondary | ICD-10-CM | POA: Diagnosis not present

## 2019-09-30 DIAGNOSIS — J45909 Unspecified asthma, uncomplicated: Secondary | ICD-10-CM | POA: Insufficient documentation

## 2019-09-30 DIAGNOSIS — R479 Unspecified speech disturbances: Secondary | ICD-10-CM | POA: Diagnosis present

## 2019-09-30 LAB — APTT: aPTT: 25 seconds (ref 24–36)

## 2019-09-30 LAB — CBC
HCT: 45 % (ref 36.0–46.0)
Hemoglobin: 14.1 g/dL (ref 12.0–15.0)
MCH: 27.9 pg (ref 26.0–34.0)
MCHC: 31.3 g/dL (ref 30.0–36.0)
MCV: 89.1 fL (ref 80.0–100.0)
Platelets: 212 10*3/uL (ref 150–400)
RBC: 5.05 MIL/uL (ref 3.87–5.11)
RDW: 15.3 % (ref 11.5–15.5)
WBC: 12.6 10*3/uL — ABNORMAL HIGH (ref 4.0–10.5)
nRBC: 0 % (ref 0.0–0.2)

## 2019-09-30 LAB — COMPREHENSIVE METABOLIC PANEL
ALT: 33 U/L (ref 0–44)
AST: 20 U/L (ref 15–41)
Albumin: 4 g/dL (ref 3.5–5.0)
Alkaline Phosphatase: 125 U/L (ref 38–126)
Anion gap: 8 (ref 5–15)
BUN: 10 mg/dL (ref 6–20)
CO2: 27 mmol/L (ref 22–32)
Calcium: 9.2 mg/dL (ref 8.9–10.3)
Chloride: 105 mmol/L (ref 98–111)
Creatinine, Ser: 0.6 mg/dL (ref 0.44–1.00)
GFR calc Af Amer: >60 mL/min (ref >60)
GFR calc non Af Amer: >60 mL/min (ref >60)
Glucose, Bld: 102 mg/dL — ABNORMAL HIGH (ref 70–99)
Potassium: 3.2 mmol/L — ABNORMAL LOW (ref 3.5–5.1)
Sodium: 140 mmol/L (ref 135–145)
Total Bilirubin: 0.8 mg/dL (ref 0.3–1.2)
Total Protein: 6.9 g/dL (ref 6.5–8.1)

## 2019-09-30 LAB — DIFFERENTIAL
Abs Immature Granulocytes: 0.16 10*3/uL — ABNORMAL HIGH (ref 0.00–0.07)
Basophils Absolute: 0.1 10*3/uL (ref 0.0–0.1)
Basophils Relative: 0 %
Eosinophils Absolute: 0.1 10*3/uL (ref 0.0–0.5)
Eosinophils Relative: 1 %
Immature Granulocytes: 1 %
Lymphocytes Relative: 42 %
Lymphs Abs: 5.4 10*3/uL — ABNORMAL HIGH (ref 0.7–4.0)
Monocytes Absolute: 1 10*3/uL (ref 0.1–1.0)
Monocytes Relative: 8 %
Neutro Abs: 6 10*3/uL (ref 1.7–7.7)
Neutrophils Relative %: 48 %

## 2019-09-30 LAB — I-STAT CHEM 8, ED
BUN: 12 mg/dL (ref 6–20)
Calcium, Ion: 1.19 mmol/L (ref 1.15–1.40)
Chloride: 104 mmol/L (ref 98–111)
Creatinine, Ser: 0.5 mg/dL (ref 0.44–1.00)
Glucose, Bld: 99 mg/dL (ref 70–99)
HCT: 46 % (ref 36.0–46.0)
Hemoglobin: 15.6 g/dL — ABNORMAL HIGH (ref 12.0–15.0)
Potassium: 3.3 mmol/L — ABNORMAL LOW (ref 3.5–5.1)
Sodium: 145 mmol/L (ref 135–145)
TCO2: 25 mmol/L (ref 22–32)

## 2019-09-30 LAB — PROTIME-INR
INR: 0.9 (ref 0.8–1.2)
Prothrombin Time: 12.1 seconds (ref 11.4–15.2)

## 2019-09-30 MED ORDER — SODIUM CHLORIDE 0.9% FLUSH: 3.0000 mL | Freq: Once | INTRAVENOUS | Status: DC

## 2019-09-30 MED ORDER — TRAZODONE HCL 50 MG PO TABS
50.0000 mg | ORAL_TABLET | Freq: Every evening | ORAL | 0 refills | Status: DC | PRN
Start: 2019-09-30 — End: 2020-02-11

## 2019-09-30 NOTE — ED Provider Notes (Signed)
12:31 PM Nursing asked me to come assess the patient in triage to determine if she needs to be a code stroke or not.  I assessed the patient she reports that she recently had a stroke several weeks ago.  She reports that she has had some persistent aphasia but was supposed to start taking Lyrica today.  She says that this morning when she woke up, before taking there, she was having slightly worsened speech compared to her baseline.  She went to bed about 1030 last night.  She says that after taking Lyrica, she started getting very tearful and crying and then having some soreness in her bilateral biceps as well as some tingling and numbness in her both upper arms.  She denies numbness and tingling in the hands.  She denied weakness in the hands.  She denies any other deficits aside from her speech slightly worse than before.  She agrees that she was worsened today than she was yesterday.  On my exam, she did not have any focal weakness.  Patient was having some stuttering and difficulty speaking with some mild aphasia.  I clinically do not feel she is having an LVO stroke at this time given lack of weakness, visual changes, and the bilateral nature of the tingling numbness and soreness in her bilateral upper arms.  She will likely need further evaluation and may end up needing imaging today however I do not think she is a code stroke as her symptoms were worsened since last night 1030 as her last at her baseline.     Zeanna Sunde, Gwenyth Allegra, MD 09/30/19 1233

## 2019-09-30 NOTE — ED Triage Notes (Signed)
Pt reports at 7am she took lyrica for the first time and began to feel odd, pt report pain in left arm with numbness in bicep and speech feels worse since her stroke in July. Pt tearful in triage. Pt has no drift in arm or leg. Pt is alert and ox4. Pt reports since stroke in july has had difficultly with getting her words out. LVO - at this time.

## 2019-09-30 NOTE — ED Provider Notes (Signed)
[place] EMERGENCY DEPARTMENT Provider Note   CSN: [id number] Arrival date & time: 09/30/19  1150     History Chief Complaint  Patient presents with  . Aphasia    [name] Walker is a 58 y.o. female with a past medical history of hypertension, prior TIA last month diagnosed at outside hospital presenting to the ED with a chief complaint of trouble speaking and arm pain.  Went to sleep last night in her usual state of health.  This morning woke up in her usual state of health as well. Around 7am took a dose of Lyrica as prescribed by her orthopedist yesterday.  This was the first time she has taken this medication.  A few minutes later started experiencing trouble speaking, pain to bilateral upper arms and pain to her left foot and "feeling emotional."  Her pain as improved slightly without intervention. When she was waiting in the waiting room began having a headache.  She then states that she has had trouble with her speech since her TIA last month. Denies any injuries or falls. Baseline weakness to her dominant L arm/hand since her TIA. This has not worsened. Denies any chest pain, shortness of breath, injuries or falls, vomiting, diarrhea, rash, trouble swallowing, sick contacts with similar symptoms.  HPI     Past Medical History:  Diagnosis Date  . ACHILLES TENDINITIS   . ALLERGIC RHINITIS   . Allergy   . Anginal pain ([place]) 04/04/2016   Pt currently feels like she is having muscle spasms and aching in her chest  . Anxiety   . Asthma   . Chronic kidney disease   . GERD   . History of kidney stones   . HYPERTENSION   . LATERAL EPICONDYLITIS, RIGHT   . LOW BACK PAIN   . Plantar fascial fibromatosis   . SUBACROMIAL BURSITIS, RIGHT   . Thoracic scoliosis 10/01/2015    Patient Active Problem List   Diagnosis Date Noted  . TIA (transient ischemic attack) 09/15/2019  . HLD (hyperlipidemia) 04/14/2019  . Acute ear pain, bilateral 04/14/2019  . Vitamin D  deficiency 04/14/2019  . Anxiety 10/06/2018  . Neck pain 09/10/2018  . Hand swelling 09/10/2018  . Low back pain 06/03/2018  . Bilateral radiating leg pain 04/23/2018  . Myalgia 04/23/2018  . Right renal stone 04/03/2018  . Smoker 04/29/2016  . Bilateral hand pain 04/29/2016  . HNP (herniated nucleus pulposus), cervical 04/07/2016  . Cervical radiculopathy, acute 01/29/2016  . Acute upper respiratory infection 01/29/2016  . Degenerative disc disease, cervical 01/11/2016  . Hyperglycemia 11/17/2015  . Angioedema 10/12/2015  . Bilateral lower extremity edema 10/12/2015  . Mouth sores 10/12/2015  . Hypokalemia 10/12/2015  . Thoracic scoliosis 10/01/2015  . Peripheral edema 10/01/2015  . Eczema 10/01/2015  . Asthma 10/01/2015  . Breast lump on right side at 4 o'clock position 01/21/2014  . Hot flashes 10/01/2013  . Lumbar radiculopathy 07/17/2012  . Grief reaction 07/17/2012  . Family history of colon cancer 07/02/2012  . OAB (overactive bladder) 03/01/2012  . Encounter for well adult exam with abnormal findings 08/23/2010  . Plantar fasciitis 08/23/2010  . Dysuria 01/05/2010  . UTI (urinary tract infection) 10/19/2008  . ANKLE PAIN, BILATERAL 10/19/2008  . VAGINITIS 09/10/2008  . JOINT EFFUSION, ANKLE 09/10/2008  . SUBACROMIAL BURSITIS, RIGHT 09/01/2008  . ACHILLES TENDINITIS 09/01/2008  . LATERAL EPICONDYLITIS, RIGHT 08/18/2008  . Essential hypertension 07/12/2007  . Allergic rhinitis 07/12/2007  . GERD 07/12/2007  . Chronic  low back pain 07/12/2007  . NEPHROLITHIASIS, HX OF 07/12/2007    Past Surgical History:  Procedure Laterality Date  . ANTERIOR CERVICAL DECOMP/DISCECTOMY FUSION N/A 04/07/2016   Procedure: Cervical two-three Anterior cervical decompression/discectomy/fusion;  Surgeon: Ashok Pall, MD;  Location: [facility];  Service: Neurosurgery;  Laterality: N/A;  . BACK SURGERY  2015   lower back  . EXTRACORPOREAL SHOCK WAVE LITHOTRIPSY Left 09/28/2016   Procedure:  LEFT EXTRACORPOREAL SHOCK WAVE LITHOTRIPSY (ESWL);  Surgeon: Festus Aloe, MD;  Location: [facility];  Service: Urology;  Laterality: Left;  . EXTRACORPOREAL SHOCK WAVE LITHOTRIPSY Right 04/08/2018   Procedure: EXTRACORPOREAL SHOCK WAVE LITHOTRIPSY (ESWL);  Surgeon: Ardis Hughs, MD;  Location: [facility];  Service: Urology;  Laterality: Right;  . KIDNEY SURGERY    . LITHOTRIPSY  05/2016  . TUBAL LIGATION       OB History    Gravida  3   Para  3   Term      Preterm      AB      Living  3     SAB      TAB      Ectopic      Multiple      Live Births              Family History  Problem Relation Age of Onset  . Cancer Sister        colon cancer  . Colon cancer Sister   . Diabetes Mother   . Cancer Father        Prostate  . Colon cancer Father   . Breast cancer Maternal Aunt        50's  . Esophageal cancer Neg Hx   . Stomach cancer Neg Hx     Social History   Tobacco Use  . Smoking status: Current Every Day Smoker    Packs/day: 0.50    Years: 20.00    Pack years: 10.00    Types: Cigarettes  . Smokeless tobacco: Never Used  Vaping Use  . Vaping Use: Never used  Substance Use Topics  . Alcohol use: No  . Drug use: No    Home Medications Prior to Admission medications   Medication Sig Start Date End Date Taking? Authorizing Provider  pregabalin (LYRICA) 25 MG capsule Take 25 mg by mouth 2 (two) times daily. 09/26/19  Yes [provider]  acetaminophen (TYLENOL) 500 MG tablet Take 1,000 mg by mouth every 6 (six) hours as needed for mild pain.    [provider]  albuterol (PROVENTIL HFA;VENTOLIN HFA) 108 (90 Base) MCG/ACT inhaler Inhale 2 puffs into the lungs every 6 (six) hours as needed for wheezing or shortness of breath. 05/14/18   Biagio Borg, MD  amLODipine (NORVASC) 5 MG tablet Take 1 tablet (5 mg total) by mouth daily. 04/14/19   Biagio Borg, MD  aspirin EC 81 MG tablet Take 1 tablet (81 mg total) by mouth daily.  Swallow whole. 09/15/19   Biagio Borg, MD  atorvastatin (LIPITOR) 10 MG tablet Take 1 tablet (10 mg total) by mouth daily. 09/15/19   Biagio Borg, MD  Azelastine-Fluticasone [facility]) 137-50 MCG/ACT SUSP Place 1 spray into the nose 2 (two) times daily. Patient not taking: Reported on 09/27/2019 06/08/18   Luetta Nutting, DO  cephALEXin [facility]) 500 MG capsule Take by mouth. 09/25/19   [provider]  cetirizine (ZYRTEC) 10 MG tablet Take 10 mg by mouth daily as needed for  allergies.    [provider]  DULoxetine (CYMBALTA) 30 MG capsule Take 1 capsule (30 mg total) by mouth daily. Patient not taking: Reported on 09/27/2019 09/15/19   Biagio Borg, MD  fluticasone [facility]) 50 MCG/ACT nasal spray Place 2 sprays into both nostrils daily. 1 spray in the morning and 1 at night for 14 days. Then resume 1 time daily Patient not taking: Reported on 09/27/2019 04/01/19   Darr, Marguerita Beards, PA-C  gabapentin (NEURONTIN) 100 MG capsule Take 100 mg by mouth 3 (three) times daily. 09/25/19   [provider]  hydrochlorothiazide (HYDRODIURIL) 25 MG tablet Take 1 tablet (25 mg total) by mouth daily. Patient not taking: Reported on 09/27/2019 09/06/17   Biagio Borg, MD  HYDROcodone-acetaminophen (NORCO/VICODIN) 5-325 MG tablet Take 1 tablet by mouth every 4 (four) hours as needed for moderate pain. 09/27/19   Daleen Bo, MD  ibuprofen (ADVIL) 800 MG tablet Take 1 tablet (800 mg total) by mouth every 8 (eight) hours as needed. Patient not taking: Reported on 09/27/2019 08/26/19   Biagio Borg, MD  LORazepam (ATIVAN) 0.5 MG tablet Take 1 tablet (0.5 mg total) by mouth 2 (two) times daily as needed. for anxiety 09/09/19   Biagio Borg, MD  losartan (COZAAR) 50 MG tablet Take 1 tablet (50 mg total) by mouth daily. Patient not taking: Reported on 09/27/2019 04/14/19   Biagio Borg, MD  methocarbamol (ROBAXIN) 500 MG tablet Take 500 mg by mouth 4 (four) times daily as needed for muscle spasms.  09/25/19   [provider]  methylPREDNISolone (MEDROL DOSEPAK) 4 MG TBPK tablet Take 4-24 mg by mouth See admin instructions. Day 1: 6 tablets (24mg ) Day 2: 5 tablets (20mg ) Day 3: 4 tablets (16mg ) Day 4: 3 tablets (12mg ) Day 5: 2 tablets (8mg ) Day 6: 1 tablet (4mg ) Take all doses with food. 09/25/19   [provider]  pantoprazole (PROTONIX) 40 MG tablet Take 1 tablet (40 mg total) by mouth daily. 04/14/19   Biagio Borg, MD  predniSONE (DELTASONE) 20 MG tablet Take 60 mg by mouth daily. 1 day remaining 09/27/19 09/25/19   [provider]  tamsulosin (FLOMAX) 0.4 MG CAPS capsule Take 0.4 mg by mouth daily.  Patient not taking: Reported on 09/27/2019 04/01/18   [provider]  traZODone (DESYREL) 50 MG tablet Take 1 tablet (50 mg total) by mouth at bedtime as needed for sleep. 09/30/19   Vennela Jutte, PA-C    Allergies    Flexeril [cyclobenzaprine], Lisinopril, Other, Robaxin [methocarbamol], Contrast media [iodinated diagnostic agents], and Gabapentin  Review of Systems   Review of Systems  Constitutional: Negative for appetite change, chills and fever.  HENT: Negative for ear pain, rhinorrhea, sneezing and sore throat.   Eyes: Negative for photophobia and visual disturbance.  Respiratory: Negative for cough, chest tightness, shortness of breath and wheezing.   Cardiovascular: Negative for chest pain and palpitations.  Gastrointestinal: Negative for abdominal pain, blood in stool, constipation, diarrhea, nausea and vomiting.  Genitourinary: Negative for dysuria, hematuria and urgency.  Musculoskeletal: Positive for myalgias.  Skin: Negative for rash.  Neurological: Positive for speech difficulty and headaches. Negative for dizziness, weakness and light-headedness.  Psychiatric/Behavioral: Positive for dysphoric mood.    Physical Exam Updated Vital Signs BP (!) 173/98   Pulse 73   Temp 98.8 F (37.1 C) (Oral)   Resp 17   LMP 08/22/2010   SpO2 98%    Physical Exam Vitals and nursing note reviewed.  Constitutional:      General: She is not in acute distress.    Appearance: She is well-developed.     Comments: Slowed speech. Will become tearful at times when speaking about her symptoms.  HENT:     Head: Normocephalic and atraumatic.     Nose: Nose normal.  Eyes:     General: No scleral icterus.       Right eye: No discharge.        Left eye: No discharge.     Conjunctiva/sclera: Conjunctivae normal.     Pupils: Pupils are equal, round, and reactive to light.  Neck:     Comments: No neck stiffness noted. Cardiovascular:     Rate and Rhythm: Normal rate and regular rhythm.     Heart sounds: Normal heart sounds. No murmur heard.  No friction rub. No gallop.   Pulmonary:     Effort: Pulmonary effort is normal. No respiratory distress.     Breath sounds: Normal breath sounds.  Abdominal:     General: Bowel sounds are normal. There is no distension.     Palpations: Abdomen is soft.     Tenderness: There is no abdominal tenderness. There is no guarding.  Musculoskeletal:        General: Normal range of motion.     Cervical back: Normal range of motion and neck supple.  Skin:    General: Skin is warm and dry.     Findings: No rash.  Neurological:     General: No focal deficit present.     Mental Status: She is alert and oriented to person, place, and time.     Cranial Nerves: No cranial nerve deficit.     Sensory: No sensory deficit.     Motor: Weakness present. No abnormal muscle tone.     Coordination: Coordination normal.     Comments: Pupils reactive. No facial asymmetry noted. Cranial nerves appear grossly intact. Sensation intact to light touch on face, BUE and BLE. Strength 5/5 in right upper extremity, bilateral lower extremities. Strength 4/5 in LUE noticed with grip strength. Normal finger to nose coordination bilaterally.  Psychiatric:        Mood and Affect: Affect is tearful.     ED Results / Procedures /  Treatments   Labs (all labs ordered are listed, but only abnormal results are displayed) Labs Reviewed  CBC - Abnormal; Notable for the following components:      Result Value   WBC 12.6 (*)    All other components within normal limits  DIFFERENTIAL - Abnormal; Notable for the following components:   Lymphs Abs 5.4 (*)    Abs Immature Granulocytes 0.16 (*)    All other components within normal limits  COMPREHENSIVE METABOLIC PANEL - Abnormal; Notable for the following components:   Potassium 3.2 (*)    Glucose, Bld 102 (*)    All other components within normal limits  I-STAT CHEM 8, ED - Abnormal; Notable for the following components:   Potassium 3.3 (*)    Hemoglobin 15.6 (*)    All other components within normal limits  PROTIME-INR  APTT  CBG MONITORING, ED    EKG None  Radiology CT HEAD WO CONTRAST  Result Date: 09/30/2019 CLINICAL DATA:  Slurred speech, left arm numbness EXAM: CT HEAD WITHOUT CONTRAST TECHNIQUE: Contiguous axial images were obtained from the base of the skull through the vertex without intravenous contrast. COMPARISON:  09/27/2019 FINDINGS: Brain: There is no acute intracranial hemorrhage, mass effect,  or edema. Gray-white differentiation is preserved. There is no extra-axial fluid collection. Ventricles and sulci are within normal limits in size and configuration. Vascular: There is minor atherosclerotic calcification at the skull base. Skull: Calvarium is unremarkable. Sinuses/Orbits: No acute finding. Other: Incidental note is made of a partially empty sella. Mastoid air cells are clear. IMPRESSION: No acute intracranial abnormality. Electronically Signed   By: Macy Mis M.D.   On: 09/30/2019 14:36    Procedures Procedures (including critical care time)  Medications Ordered in ED Medications  sodium chloride flush (NS) 0.9 % injection 3 mL (has no administration in time range)    ED Course  I have reviewed the triage vital signs and the nursing  notes.  Pertinent labs & imaging results that were available during my care of the patient were reviewed by me and considered in my medical decision making (see chart for details).    MDM Rules/Calculators/A&P                          59yo F with a recent TIA on July 16 presenting to the ED with a chief complaint of aphasia and bilateral arm pain since taking her first dose of Lyrica this morning.  This is the first time that she took this medication.  Started having a headache while waiting in the waiting room.  She was evaluated in triage by the ED provider who ordered lab work and imaging although she did not meet criteria for code stroke or LVO.  She denies any worsening weakness or numbness in her left upper extremity different than her prior TIA diagnosed last month.  She denies any injuries or falls.  She states that her pain in bilateral upper extremities has resolved since being in the ED. chart review shows that she had MRI of her brain done on 09/17/2019 which was reassuring.  She did have CT head, CT angio head and neck on 09/27/2019 which presented to the ER with speech difficulty.  On exam today patient is overall well-appearing.  She does have some slowed speech although this appears chronic based on chart review.  She does have left upper extremity weakness that she states is chronic and unchanged.  No facial asymmetry noted.  She is alert and oriented x3.  No meningeal signs noted.  CT of the head here shows no acute findings.  Lab work including CBC, CMP unremarkable.  Coag labs are within normal limits.  EKG without any acute ischemic findings.  Based on her work-up within these past 2 weeks and today, feel that her symptoms do not appear to be related to a acute neurological condition such as a stroke.  Unsure if the symptoms could be due to psychiatric condition.  We will have her take trazodone as needed to help with sleep.  She will need to follow-up with her primary care provider and  neurologist. Patient is agreeable to plan. Return precautions given. Patient discussed with and seen by the attending, Dr. Sedonia Small.   Patient is hemodynamically stable, in NAD, and able to ambulate in the ED. Evaluation does not show pathology that would require ongoing emergent intervention or inpatient treatment. I explained the diagnosis to the patient. Pain has been managed and has no complaints prior to discharge. Patient is comfortable with above plan and is stable for discharge at this time. All questions were answered prior to disposition. Strict return precautions for returning to the ED were discussed. Encouraged follow  up with PCP.   An After Visit Summary was printed and given to the patient.   Portions of this note were generated with [employer]. Dictation errors may occur despite best attempts at proofreading.  Final Clinical Impression(s) / ED Diagnoses Final diagnoses:  Anxiety    Rx / DC Orders ED Discharge Orders         Ordered    traZODone (DESYREL) 50 MG tablet  At bedtime PRN     Discontinue  Reprint     09/30/19 1800           Delia Heady, PA-C 09/30/19 1802    Maudie Flakes, MD 09/30/19 [id number]

## 2019-09-30 NOTE — Discharge Instructions (Addendum)
Follow-up with your primary care provider. Take the trazodone as needed to help with sleep. Return to the ER if you start to experience chest pain, shortness of breath, numbness in arms or legs, injuries or falls, vision changes.

## 2019-10-01 ENCOUNTER — Telehealth: Payer: Self-pay | Admitting: Internal Medicine

## 2019-10-01 ENCOUNTER — Telehealth: Payer: Self-pay | Admitting: *Deleted

## 2019-10-01 NOTE — Telephone Encounter (Signed)
Patient called wanting to reschedule her appointment on 10/16/19 for cervical dilation, report she recently had a stroke, transferred appointments to reschedule.

## 2019-10-01 NOTE — Telephone Encounter (Signed)
[facility] Call/Report : --Caller states PT was given rx that is now making her cry a lot, tremoring, and pain in her arms. RX is Pregabalin 25 mg. PT may have had a stroke last week and again on Saturday. She is stuttering and balance is off.  Advised go to ED now.  Patient did go to ED as advised.

## 2019-10-02 ENCOUNTER — Other Ambulatory Visit: Payer: Self-pay

## 2019-10-02 ENCOUNTER — Ambulatory Visit (INDEPENDENT_AMBULATORY_CARE_PROVIDER_SITE_OTHER): Payer: BC Managed Care – PPO | Admitting: Internal Medicine

## 2019-10-02 ENCOUNTER — Encounter: Payer: Self-pay | Admitting: Internal Medicine

## 2019-10-02 ENCOUNTER — Ambulatory Visit (HOSPITAL_COMMUNITY): Payer: BC Managed Care – PPO | Attending: Internal Medicine

## 2019-10-02 VITALS — BP 130/80 | HR 87 | Temp 97.6°F | Ht 63.0 in | Wt 153.0 lb

## 2019-10-02 DIAGNOSIS — G459 Transient cerebral ischemic attack, unspecified: Secondary | ICD-10-CM | POA: Insufficient documentation

## 2019-10-02 DIAGNOSIS — F419 Anxiety disorder, unspecified: Secondary | ICD-10-CM | POA: Diagnosis not present

## 2019-10-02 DIAGNOSIS — G8929 Other chronic pain: Secondary | ICD-10-CM

## 2019-10-02 DIAGNOSIS — R479 Unspecified speech disturbances: Secondary | ICD-10-CM | POA: Diagnosis not present

## 2019-10-02 DIAGNOSIS — M545 Low back pain: Secondary | ICD-10-CM

## 2019-10-02 DIAGNOSIS — I10 Essential (primary) hypertension: Secondary | ICD-10-CM

## 2019-10-02 DIAGNOSIS — G4700 Insomnia, unspecified: Secondary | ICD-10-CM | POA: Diagnosis not present

## 2019-10-02 LAB — ECHOCARDIOGRAM COMPLETE
Area-P 1/2: 5.38 cm2
S' Lateral: 2.3 cm

## 2019-10-02 MED ORDER — HYDROCODONE-ACETAMINOPHEN 5-325 MG PO TABS: 1.0000 | ORAL_TABLET | Freq: Four times a day (QID) | ORAL | 0 refills | Status: DC | PRN

## 2019-10-02 MED ORDER — LORAZEPAM 0.5 MG PO TABS: 0.5000 mg | ORAL_TABLET | Freq: Two times a day (BID) | ORAL | 2 refills | Status: DC | PRN

## 2019-10-02 NOTE — Assessment & Plan Note (Signed)
Ok for melatonin 10 qhs prn

## 2019-10-02 NOTE — Assessment & Plan Note (Signed)
Cont ativan bid prn,  to f/u any worsening symptoms or concerns, declines psychiatric referral

## 2019-10-02 NOTE — Progress Notes (Signed)
Subjective:    Patient ID: [name] Walker, female    DOB: 1960-10-10, 59 y.o.   MRN: [id number]  HPI  Here after recent ED visit code stroke negative for acute cva; pt conts to have speech and balance issue il defined, no falls, has no other focal changes, has neurology f/u planned aug 12.  Family with her mentions persistent fatigue, sleep difficulty, increased anxiety symptoms and near panic and Pt continues to have recurring LBP without change in severity, bowel or bladder change, fever, wt loss,  worsening LE pain/numbness/weakness, gait change or falls.  Received hydrocodone 5 325.  Pt denies chest pain, increased sob or doe, wheezing, orthopnea, PND, increased LE swelling, palpitations, dizziness or syncope.   Pt denies polydipsia, polyuria,   Pt denies fever, wt loss, night sweats, loss of appetite, or other constitutional symptoms   Past Medical History:  Diagnosis Date  . ACHILLES TENDINITIS   . ALLERGIC RHINITIS   . Allergy   . Anginal pain ([place]) 04/04/2016   Pt currently feels like she is having muscle spasms and aching in her chest  . Anxiety   . Asthma   . Chronic kidney disease   . GERD   . History of kidney stones   . HYPERTENSION   . LATERAL EPICONDYLITIS, RIGHT   . LOW BACK PAIN   . Plantar fascial fibromatosis   . SUBACROMIAL BURSITIS, RIGHT   . Thoracic scoliosis 10/01/2015   Past Surgical History:  Procedure Laterality Date  . ANTERIOR CERVICAL DECOMP/DISCECTOMY FUSION N/A 04/07/2016   Procedure: Cervical two-three Anterior cervical decompression/discectomy/fusion;  Surgeon: Ashok Pall, MD;  Location: [facility];  Service: Neurosurgery;  Laterality: N/A;  . BACK SURGERY  2015   lower back  . EXTRACORPOREAL SHOCK WAVE LITHOTRIPSY Left 09/28/2016   Procedure: LEFT EXTRACORPOREAL SHOCK WAVE LITHOTRIPSY (ESWL);  Surgeon: Festus Aloe, MD;  Location: [facility];  Service: Urology;  Laterality: Left;  . EXTRACORPOREAL SHOCK WAVE LITHOTRIPSY Right 04/08/2018   Procedure:  EXTRACORPOREAL SHOCK WAVE LITHOTRIPSY (ESWL);  Surgeon: Ardis Hughs, MD;  Location: [facility];  Service: Urology;  Laterality: Right;  . KIDNEY SURGERY    . LITHOTRIPSY  05/2016  . TUBAL LIGATION      reports that she has been smoking cigarettes. She has a 10.00 pack-year smoking history. She has never used smokeless tobacco. She reports that she does not drink alcohol and does not use drugs. family history includes Breast cancer in her maternal aunt; Cancer in her father and sister; Colon cancer in her father and sister; Diabetes in her mother. Allergies  Allergen Reactions  . Flexeril [Cyclobenzaprine] Swelling    Tongue swells  . Lisinopril Other (See Comments)    ? Possible tongue swelling   . Other Palpitations    ALL MUSCLE RELAXERS-PER PATIENT  . Robaxin [Methocarbamol] Other (See Comments)    Tongue swelling  . Contrast Media [Iodinated Diagnostic Agents] Itching  . Gabapentin Other (See Comments)   Current Outpatient Medications on File Prior to Visit  Medication Sig Dispense Refill  . acetaminophen (TYLENOL) 500 MG tablet Take 1,000 mg by mouth every 6 (six) hours as needed for mild pain.    Marland Kitchen albuterol (PROVENTIL HFA;VENTOLIN HFA) 108 (90 Base) MCG/ACT inhaler Inhale 2 puffs into the lungs every 6 (six) hours as needed for wheezing or shortness of breath. 1 Inhaler 11  . amLODipine (NORVASC) 5 MG tablet Take 1 tablet (5 mg total) by mouth daily. 90 tablet 3  . aspirin EC  81 MG tablet Take 1 tablet (81 mg total) by mouth daily. Swallow whole. 30 tablet 11  . atorvastatin (LIPITOR) 10 MG tablet Take 1 tablet (10 mg total) by mouth daily. 90 tablet 3  . cephALEXin (KEFLEX) 500 MG capsule Take by mouth.    . cetirizine (ZYRTEC) 10 MG tablet Take 10 mg by mouth daily as needed for allergies.    Marland Kitchen gabapentin (NEURONTIN) 100 MG capsule Take 100 mg by mouth 3 (three) times daily.    . methylPREDNISolone (MEDROL DOSEPAK) 4 MG TBPK tablet Take 4-24 mg by mouth See admin  instructions. Day 1: 6 tablets (24mg ) Day 2: 5 tablets (20mg ) Day 3: 4 tablets (16mg ) Day 4: 3 tablets (12mg ) Day 5: 2 tablets (8mg ) Day 6: 1 tablet (4mg ) Take all doses with food.    . pantoprazole (PROTONIX) 40 MG tablet Take 1 tablet (40 mg total) by mouth daily. 90 tablet 3  . traZODone (DESYREL) 50 MG tablet Take 1 tablet (50 mg total) by mouth at bedtime as needed for sleep. 15 tablet 0   No current facility-administered medications on file prior to visit.   Review of Systems  All otherwise neg per pt     Objective:   Physical Exam BP 130/80 (BP Location: Left Arm, Patient Position: Sitting, Cuff Size: Large)   Pulse 87   Temp 97.6 F (36.4 C) (Oral)   Ht 5\' 3"  (1.6 m)   Wt 153 lb (69.4 kg)   LMP 08/22/2010   SpO2 98%   BMI 27.10 kg/m  VS noted,  Constitutional: Pt appears in NAD HENT: Head: NCAT.  Right Ear: External ear normal.  Left Ear: External ear normal.  Eyes: . Pupils are equal, round, and reactive to light. Conjunctivae and EOM are normal Nose: without d/c or deformity Neck: Neck supple. Gross normal ROM Cardiovascular: Normal rate and regular rhythm.   Pulmonary/Chest: Effort normal and breath sounds without rales or wheezing.  Abd:  Soft, NT, ND, + BS, no organomegaly Neurological: Pt is alert. At baseline orientation, motor grossly intact Skin: Skin is warm. No rashes, other new lesions, no LE edema Psychiatric: Pt behavior is normal without agitation  All otherwise neg per pt Lab Results  Component Value Date   WBC 12.6 (H) 09/30/2019   HGB 15.6 (H) 09/30/2019   HCT 46.0 09/30/2019   PLT 212 09/30/2019   GLUCOSE 99 09/30/2019   CHOL 195 10/03/2018   TRIG 200.0 (H) 10/03/2018   HDL 39.50 10/03/2018   LDLDIRECT 117.0 09/06/2017   LDLCALC 116 (H) 10/03/2018   ALT 33 09/30/2019   AST 20 09/30/2019   NA 145 09/30/2019   K 3.3 (L) 09/30/2019   CL 104 09/30/2019   CREATININE 0.50 09/30/2019   BUN 12 09/30/2019   CO2 27 09/30/2019   TSH 0.47  09/23/2019   INR 0.9 09/30/2019   HGBA1C 5.3 09/23/2019      Assessment & Plan:

## 2019-10-02 NOTE — Patient Instructions (Addendum)
Please continue all other medications as before, and refills have been done if requested - the hydrocodone, and lorazepam refills  Please have the pharmacy call with any other refills you may need.  Please continue your efforts at being more active, low cholesterol diet, and weight control.  Please keep your appointments with your specialists as you may have planned - neurology for Aug 12  You will be contacted regarding the referral for: pain management clinic [facility])  Please make an Appointment to return in 3 months

## 2019-10-02 NOTE — Assessment & Plan Note (Signed)
stable overall by history and exam, recent data reviewed with pt, and pt to continue medical treatment as before,  to f/u any worsening symptoms or concerns  

## 2019-10-02 NOTE — Assessment & Plan Note (Addendum)
Etiology unclaer, for neuro f/u aug 12  I spent 31 minutes in preparing to see the patient by review of recent labs, imaging and procedures, obtaining and reviewing separately obtained history, communicating with the patient and family or caregiver, ordering medications, tests or procedures, and documenting clinical information in the EHR including the differential Dx, treatment, and any further evaluation and other management of speech difficulty, lbp, insomnia, htn, anxiety,

## 2019-10-02 NOTE — Assessment & Plan Note (Signed)
Now requiring hydrocodone, to continue prn, refer pain management

## 2019-10-03 ENCOUNTER — Encounter: Payer: Self-pay | Admitting: Internal Medicine

## 2019-10-03 ENCOUNTER — Telehealth: Payer: Self-pay | Admitting: Internal Medicine

## 2019-10-03 NOTE — Telephone Encounter (Signed)
Rec'd from [facility] forwarded 3 pages to Dr. Clayton Bibles

## 2019-10-06 ENCOUNTER — Telehealth: Payer: Self-pay | Admitting: Internal Medicine

## 2019-10-06 NOTE — Telephone Encounter (Signed)
New Message:   Pt states that they are needing a PA for HYDROcodone-acetaminophen (NORCO/VICODIN) 5-325 MG tablet. Please advise.

## 2019-10-06 NOTE — Telephone Encounter (Signed)
Key: [id number]

## 2019-10-07 ENCOUNTER — Encounter: Payer: Self-pay | Admitting: Physical Therapy

## 2019-10-07 ENCOUNTER — Other Ambulatory Visit: Payer: Self-pay

## 2019-10-07 ENCOUNTER — Ambulatory Visit: Payer: BC Managed Care – PPO | Attending: Internal Medicine | Admitting: Physical Therapy

## 2019-10-07 DIAGNOSIS — M542 Cervicalgia: Secondary | ICD-10-CM | POA: Diagnosis present

## 2019-10-07 DIAGNOSIS — M545 Low back pain, unspecified: Secondary | ICD-10-CM

## 2019-10-07 DIAGNOSIS — M6281 Muscle weakness (generalized): Secondary | ICD-10-CM | POA: Diagnosis present

## 2019-10-07 DIAGNOSIS — G8929 Other chronic pain: Secondary | ICD-10-CM | POA: Diagnosis present
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
## 2019-10-07 NOTE — Patient Instructions (Signed)
Access Code: [email or web address] URL: [email or web address] Date: 10/07/2019 Prepared by: Hilda Blades  Exercises Supine March - 2-3 x daily - 7 x weekly - 10 reps Supine Lower Trunk Rotation - 2-3 x daily - 7 x weekly - 5 reps - 10 seconds hold Bridge - 2-3 x daily - 7 x weekly - 2 sets - 5 reps Hooklying Clamshell with Resistance - 2-3 x daily - 7 x weekly - 2 sets - 5 reps Seated Piriformis Stretch - 2-3 x daily - 7 x weekly - 2 reps - 20 seconds hold

## 2019-10-09 ENCOUNTER — Encounter: Payer: Self-pay | Admitting: Neurology

## 2019-10-09 ENCOUNTER — Ambulatory Visit: Payer: BC Managed Care – PPO | Admitting: Neurology

## 2019-10-09 VITALS — BP 116/79 | HR 88 | Ht 63.0 in | Wt 155.0 lb

## 2019-10-09 DIAGNOSIS — F8081 Childhood onset fluency disorder: Secondary | ICD-10-CM

## 2019-10-09 DIAGNOSIS — F419 Anxiety disorder, unspecified: Secondary | ICD-10-CM

## 2019-10-09 MED ORDER — CITALOPRAM HYDROBROMIDE 10 MG PO TABS
10.0000 mg | ORAL_TABLET | Freq: Every day | ORAL | 2 refills | Status: DC
Start: 2019-10-09 — End: 2020-01-29

## 2019-10-09 NOTE — Patient Instructions (Signed)
I had a long discussion the patient and her husband regarding her symptoms of intermittent speech difficulty stammering and subjective left-sided weakness with absence of any definitive deficits on exam or stroke noted on MRI and feel significant underlying anxiety may be contributing.  I recommend a trial of Celexa 10 mg daily for anxiety and to follow-up with her primary care physician for further adjustment of doses of this medicine.  She will stay on aspirin for stroke prevention and maintain strict control of hypertension blood pressure goal below 130/90 and lipids with LDL cholesterol goal below 70 mg percent.  I do not believe further neurovascular testing is indicated at the present time.  I also recommend she participate in increase activities for stress laxation like regular exercise, meditation and yoga.  She will return for follow-up in 2 months or call earlier if necessary.
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
## 2019-10-13 ENCOUNTER — Telehealth: Payer: Self-pay | Admitting: Internal Medicine

## 2019-10-13 NOTE — Telephone Encounter (Signed)
[name] is requesting a call from you.

## 2019-10-16 ENCOUNTER — Ambulatory Visit: Payer: BC Managed Care – PPO | Admitting: Obstetrics and Gynecology

## 2019-10-16 NOTE — Telephone Encounter (Signed)
Spoke with pt and she has stated she was told by one of her doctors office that someone in the doctors office tested Positive.  **Pt states she went to get tested yesterday and is awaiting her results.

## 2019-10-17 ENCOUNTER — Other Ambulatory Visit: Payer: Self-pay

## 2019-10-17 ENCOUNTER — Encounter: Payer: Self-pay | Admitting: Physical Therapy

## 2019-10-17 ENCOUNTER — Ambulatory Visit: Payer: BC Managed Care – PPO | Admitting: Physical Therapy

## 2019-10-17 DIAGNOSIS — M542 Cervicalgia: Secondary | ICD-10-CM

## 2019-10-17 DIAGNOSIS — M545 Low back pain, unspecified: Secondary | ICD-10-CM

## 2019-10-17 DIAGNOSIS — G8929 Other chronic pain: Secondary | ICD-10-CM

## 2019-10-17 DIAGNOSIS — M6281 Muscle weakness (generalized): Secondary | ICD-10-CM

## 2019-10-17 NOTE — Therapy (Signed)
[place], [place], [address] Phone: [phone]   Fax:  [phone]  Physical Therapy Treatment  Patient Details  Name: SUNDRA HADDIX MRN: [id number] Date of Birth: [birth year] Referring Provider (PT): Biagio Borg, MD   Encounter Date: 10/17/2019   PT End of Session - 10/17/19 1109    Visit Number 2    Number of Visits 8    Date for PT Re-Evaluation 12/02/19    Authorization Type BCBS    PT Start Time 1113    PT Stop Time 1200    PT Time Calculation (min) 47 min    Activity Tolerance Patient tolerated treatment well    Behavior During Therapy [facility] for tasks assessed/performed           Past Medical History:  Diagnosis Date   ACHILLES TENDINITIS    ALLERGIC RHINITIS    Allergy    Anginal pain ([place]) 04/04/2016   Pt currently feels like she is having muscle spasms and aching in her chest   Anxiety    Asthma    Chronic kidney disease    GERD    History of kidney stones    HYPERTENSION    LATERAL EPICONDYLITIS, RIGHT    LOW BACK PAIN    Plantar fascial fibromatosis    SUBACROMIAL BURSITIS, RIGHT    Thoracic scoliosis 10/01/2015   TIA (transient ischemic attack)     Past Surgical History:  Procedure Laterality Date   ANTERIOR CERVICAL DECOMP/DISCECTOMY FUSION N/A 04/07/2016   Procedure: Cervical two-three Anterior cervical decompression/discectomy/fusion;  Surgeon: Ashok Pall, MD;  Location: [facility];  Service: Neurosurgery;  Laterality: N/A;   BACK SURGERY  2015   lower back   EXTRACORPOREAL SHOCK WAVE LITHOTRIPSY Left 09/28/2016   Procedure: LEFT EXTRACORPOREAL SHOCK WAVE LITHOTRIPSY (ESWL);  Surgeon: Festus Aloe, MD;  Location: [facility];  Service: Urology;  Laterality: Left;   EXTRACORPOREAL SHOCK WAVE LITHOTRIPSY Right 04/08/2018   Procedure: EXTRACORPOREAL SHOCK WAVE LITHOTRIPSY (ESWL);  Surgeon: Ardis Hughs, MD;  Location: [facility];  Service: Urology;   Laterality: Right;   KIDNEY SURGERY     LITHOTRIPSY  05/2016   TUBAL LIGATION      There were no vitals filed for this visit.   Subjective Assessment - 10/17/19 1108    Subjective Patient reports she continues to have left sided lower back pain. Her orthopedic appointment was cancelled and she is rescheduled to see them on the 24th. Patient states her exercises are doing good, she does have to place a pillow under her lower back when she lays on her back. She continues to have neck tightness and upper back throbbing.    Patient Stated Goals Patient would like to be able to walk and exercise to feel better    Currently in Pain? Yes    Pain Score 7     Pain Location Back    Pain Orientation Lower    Pain Descriptors / Indicators Throbbing;Spasm    Pain Type Chronic pain    Pain Onset More than a month ago    Pain Frequency Constant    Multiple Pain Sites Yes    Pain Score 6    Pain Location Neck    Pain Orientation Lower    Pain Descriptors / Indicators Aching;Tightness;Throbbing    Pain Type Chronic pain    Pain Radiating Towards upper trap region and upper back    Pain Onset More than a month ago  Pain Frequency Intermittent    Aggravating Factors  Sitting, bending over    Pain Relieving Factors Stretching              [facility] PT Assessment - 10/17/19 0001      Posture/Postural Control   Posture Comments Patient exhibits rounded shoulder and forward head posture      AROM   Overall AROM Comments Patient reports mild tightness with all directions of cervical motion, Shoulder AROM grossly WFL and non-painful    AROM Assessment Site Cervical    Cervical Flexion 40    Cervical Extension 45    Cervical - Right Side Bend 35    Cervical - Left Side Bend 35    Cervical - Right Rotation 65    Cervical - Left Rotation 65      Strength   Overall Strength Comments Periscapular strength grossly 4-/5 MMT with upper trap dominance                          [facility] Adult PT Treatment/Exercise - 10/17/19 0001      Exercises   Exercises Lumbar;Neck      Neck Exercises: Theraband   Shoulder Extension 10 reps   2 sets   Shoulder Extension Limitations yellow    Rows 10 reps   2 sets   Rows Limitations yellow      Lumbar Exercises: Stretches   Other Lumbar Stretch Exercise Child's pose stretch 2x20 seconds      Lumbar Exercises: Aerobic   Nustep L4 x 5 min (UE and LE)      Lumbar Exercises: Quadruped   Madcat/Old Horse 5 reps   2 sets, 3 sec hold     Neck Exercises: Stretches   Upper Trapezius Stretch 2 reps;20 seconds    Levator Stretch 2 reps;20 seconds                  PT Education - 10/17/19 1108    Education Details HEP update    Person(s) Educated Patient    Methods Explanation;Demonstration;Verbal cues;Tactile cues;Handout    Comprehension Verbalized understanding;Returned demonstration;Verbal cues required;Need further instruction;Tactile cues required            PT Short Term Goals - 10/07/19 1008      PT SHORT TERM GOAL #1   Title Patient will be I with initial HEP to progress with PT    Time 4    Period Weeks    Status New    Target Date 11/04/19      PT SHORT TERM GOAL #2   Title PT with review FOTO with patient and she will express understanding    Time 4    Period Weeks    Status New    Target Date 11/04/19      PT SHORT TERM GOAL #3   Title Patient will report resting pain level </= 5/10 to allow for improved functional mobility    Time 4    Period Weeks    Status New    Target Date 11/04/19             PT Long Term Goals - 10/07/19 1023      PT LONG TERM GOAL #1   Title Patient will be I with final HEP to maintain progress with PT    Time 8    Period Weeks    Status New    Target Date 12/02/19  PT LONG TERM GOAL #2   Title Patient will report improved functional level to </= 61% limitation on FOTO    Time 8    Period Weeks    Status New    Target Date 12/02/19      PT  LONG TERM GOAL #3   Title Patient will exhibit improved gross core and hip strength to >/= 4/5 MMT bilaterally to improve tolerance for standing and walking tasks    Time 8    Period Weeks    Status New    Target Date 12/02/19      PT LONG TERM GOAL #4   Title Patient will be able to stand and walk > 1 hour with </= 3/10 pain level to allow for improved community access and shopping    Time 8    Period Weeks    Status New    Target Date 12/02/19      PT LONG TERM GOAL #5   Title Patient will be able to lift small objects from floor with </= 3/10 pain level to improve homemaking ability    Time 8    Period Weeks    Status New    Target Date 12/02/19                 Plan - 10/17/19 1109    Clinical Impression Statement Patient tolerated therapy well with no adverse effects. Assessed cervical region this visit, she doesnt demonstrate any radicular symptoms, it seems more muscular tightness and postural related with decreased periscapular strength. Updated HEP to include postural strengthening and neck stretching. Patient became tearful during therapy due to frustration with chronicity of low back pain. She was educated on gradual progression and performing activities and exercises as tolerated but frequently during day to improve tolerance. Patient would benefit from continued skilled PT to improve her mobility and reduce pain in order to maximize functional ability.    PT Treatment/Interventions ADLs/Self Care Home Management;Cryotherapy;Electrical Stimulation;Iontophoresis 4mg /ml Dexamethasone;Moist Heat;Traction;Neuromuscular re-education;Balance training;Therapeutic exercise;Therapeutic activities;Functional mobility training;Stair training;Gait training;Patient/family education;Manual techniques;Dry needling;Passive range of motion;Taping;Spinal Manipulations;Joint Manipulations    PT Next Visit Plan Assess HEP and progress PRN, manual/stretching for lumbar region, progress core  and hip stretching as tolerated (patient likes a towel roll under lumbar region when lying supine)    PT Home Exercise Plan [email or web address]    Consulted and Agree with Plan of Care Patient           Patient will benefit from skilled therapeutic intervention in order to improve the following deficits and impairments:  Decreased range of motion, Difficulty walking, Postural dysfunction, Decreased strength, Decreased mobility, Pain, Decreased activity tolerance, Impaired flexibility  Visit Diagnosis: Chronic bilateral low back pain, unspecified whether sciatica present  Cervicalgia  Muscle weakness (generalized)     Problem List Patient Active Problem List   Diagnosis Date Noted   Difficulty with speech 10/02/2019   Insomnia 10/02/2019   TIA (transient ischemic attack) 09/15/2019   HLD (hyperlipidemia) 04/14/2019   Acute ear pain, bilateral 04/14/2019   Vitamin D deficiency 04/14/2019   Anxiety 10/06/2018   Neck pain 09/10/2018   Hand swelling 09/10/2018   Low back pain 06/03/2018   Bilateral radiating leg pain 04/23/2018   Myalgia 04/23/2018   Right renal stone 04/03/2018   Smoker 04/29/2016   Bilateral hand pain 04/29/2016   HNP (herniated nucleus pulposus), cervical 04/07/2016   Cervical radiculopathy, acute 01/29/2016   Acute upper respiratory infection 01/29/2016   Degenerative disc disease, cervical 01/11/2016  Hyperglycemia 11/17/2015   Angioedema 10/12/2015   Bilateral lower extremity edema 10/12/2015   Mouth sores 10/12/2015   Hypokalemia 10/12/2015   Thoracic scoliosis 10/01/2015   Peripheral edema 10/01/2015   Eczema 10/01/2015   Asthma 10/01/2015   Breast lump on right side at 4 o'clock position 01/21/2014   Hot flashes 10/01/2013   Lumbar radiculopathy 07/17/2012   Grief reaction 07/17/2012   Family history of colon cancer 07/02/2012   OAB (overactive bladder) 03/01/2012   Encounter for well adult exam with abnormal  findings 08/23/2010   Plantar fasciitis 08/23/2010   Dysuria 01/05/2010   UTI (urinary tract infection) 10/19/2008   ANKLE PAIN, BILATERAL 10/19/2008   VAGINITIS 09/10/2008   JOINT EFFUSION, ANKLE 09/10/2008   SUBACROMIAL BURSITIS, RIGHT 09/01/2008   ACHILLES TENDINITIS 09/01/2008   LATERAL EPICONDYLITIS, RIGHT 08/18/2008   Essential hypertension 07/12/2007   Allergic rhinitis 07/12/2007   GERD 07/12/2007   Chronic low back pain 07/12/2007   NEPHROLITHIASIS, HX OF 07/12/2007    Hilda Blades, PT, DPT, LAT, ATC 10/17/19  12:10 PM Phone: [phone] Fax: [facility] [address] [place], [place], [address] Phone: [phone]   Fax:  [phone]  Name: MORANDA BILLIOT MRN: [id number] Date of Birth: 10-12-1960

## 2019-10-17 NOTE — Patient Instructions (Signed)
Access Code: [email or web address] URL: [email or web address] Date: 10/17/2019 Prepared by: Hilda Blades  Exercises Supine March - 2-3 x daily - 7 x weekly - 10 reps Supine Lower Trunk Rotation - 2-3 x daily - 7 x weekly - 5 reps - 10 seconds hold Bridge - 2-3 x daily - 7 x weekly - 2 sets - 5 reps Hooklying Clamshell with Resistance - 2-3 x daily - 7 x weekly - 2 sets - 5 reps Child's Pose Stretch - 2-3 x daily - 7 x weekly - 2 reps - 20 seconds hold Cat-Camel - 2-3 x daily - 7 x weekly - 5 reps - 3 seconds hold Seated Piriformis Stretch - 2-3 x daily - 7 x weekly - 2 reps - 20 seconds hold Standing Row with Anchored Resistance - 1 x daily - 7 x weekly - 2 sets - 10 reps Scapular Retraction with Resistance Advanced - 1 x daily - 7 x weekly - 2 sets - 10 reps Gentle Upper Trap Stretch - 2-3 x daily - 7 x weekly - 2 reps - 20 seconds hold Gentle Levator Scapulae Stretch - 2-3 x daily - 7 x weekly - 2 reps - 20 seconds hold

## 2019-10-20 ENCOUNTER — Encounter: Payer: Self-pay | Admitting: Internal Medicine

## 2019-10-20 ENCOUNTER — Telehealth: Payer: Self-pay | Admitting: Family

## 2019-10-20 ENCOUNTER — Telehealth (INDEPENDENT_AMBULATORY_CARE_PROVIDER_SITE_OTHER): Payer: BC Managed Care – PPO | Admitting: Family

## 2019-10-20 DIAGNOSIS — R479 Unspecified speech disturbances: Secondary | ICD-10-CM

## 2019-10-20 DIAGNOSIS — G459 Transient cerebral ischemic attack, unspecified: Secondary | ICD-10-CM

## 2019-10-20 NOTE — Telephone Encounter (Signed)
Pt has reschedule appt for 10/31/19 whn Dr. Jenny Reichmann return...[name]

## 2019-10-20 NOTE — Telephone Encounter (Signed)
Please call her and cancel her appointment with me for tomorrow; this is a chronic issue that she and Dr. Jenny Reichmann have been working on. She should schedule a follow-up with him and it needs to be in the office.

## 2019-10-20 NOTE — Progress Notes (Signed)
[name] Walker is a 59 y.o. female with the following history as recorded in EpicCare:  Patient Active Problem List   Diagnosis Date Noted  . Difficulty with speech 10/02/2019  . Insomnia 10/02/2019  . TIA (transient ischemic attack) 09/15/2019  . HLD (hyperlipidemia) 04/14/2019  . Acute ear pain, bilateral 04/14/2019  . Vitamin D deficiency 04/14/2019  . Anxiety 10/06/2018  . Neck pain 09/10/2018  . Hand swelling 09/10/2018  . Low back pain 06/03/2018  . Bilateral radiating leg pain 04/23/2018  . Myalgia 04/23/2018  . Right renal stone 04/03/2018  . Smoker 04/29/2016  . Bilateral hand pain 04/29/2016  . HNP (herniated nucleus pulposus), cervical 04/07/2016  . Cervical radiculopathy, acute 01/29/2016  . Acute upper respiratory infection 01/29/2016  . Degenerative disc disease, cervical 01/11/2016  . Hyperglycemia 11/17/2015  . Angioedema 10/12/2015  . Bilateral lower extremity edema 10/12/2015  . Mouth sores 10/12/2015  . Hypokalemia 10/12/2015  . Thoracic scoliosis 10/01/2015  . Peripheral edema 10/01/2015  . Eczema 10/01/2015  . Asthma 10/01/2015  . Breast lump on right side at 4 o'clock position 01/21/2014  . Hot flashes 10/01/2013  . Lumbar radiculopathy 07/17/2012  . Grief reaction 07/17/2012  . Family history of colon cancer 07/02/2012  . OAB (overactive bladder) 03/01/2012  . Encounter for well adult exam with abnormal findings 08/23/2010  . Plantar fasciitis 08/23/2010  . Dysuria 01/05/2010  . UTI (urinary tract infection) 10/19/2008  . ANKLE PAIN, BILATERAL 10/19/2008  . VAGINITIS 09/10/2008  . JOINT EFFUSION, ANKLE 09/10/2008  . SUBACROMIAL BURSITIS, RIGHT 09/01/2008  . ACHILLES TENDINITIS 09/01/2008  . LATERAL EPICONDYLITIS, RIGHT 08/18/2008  . Essential hypertension 07/12/2007  . Allergic rhinitis 07/12/2007  . GERD 07/12/2007  . Chronic low back pain 07/12/2007  . NEPHROLITHIASIS, HX OF 07/12/2007    Current Outpatient Medications  Medication Sig  Dispense Refill  . acetaminophen (TYLENOL) 500 MG tablet Take 1,000 mg by mouth every 6 (six) hours as needed for mild pain.    Marland Kitchen albuterol (PROVENTIL HFA;VENTOLIN HFA) 108 (90 Base) MCG/ACT inhaler Inhale 2 puffs into the lungs every 6 (six) hours as needed for wheezing or shortness of breath. 1 Inhaler 11  . amLODipine (NORVASC) 5 MG tablet Take 1 tablet (5 mg total) by mouth daily. 90 tablet 3  . aspirin EC 81 MG tablet Take 1 tablet (81 mg total) by mouth daily. Swallow whole. 30 tablet 11  . atorvastatin (LIPITOR) 10 MG tablet Take 1 tablet (10 mg total) by mouth daily. 90 tablet 3  . cephALEXin (KEFLEX) 500 MG capsule Take by mouth.    . cetirizine (ZYRTEC) 10 MG tablet Take 10 mg by mouth daily as needed for allergies.    . citalopram (CELEXA) 10 MG tablet Take 1 tablet (10 mg total) by mouth daily. 30 tablet 2  . HYDROcodone-acetaminophen (NORCO/VICODIN) 5-325 MG tablet Take 1 tablet by mouth every 6 (six) hours as needed for moderate pain. 30 tablet 0  . LORazepam (ATIVAN) 0.5 MG tablet Take 1 tablet (0.5 mg total) by mouth 2 (two) times daily as needed. for anxiety 60 tablet 2  . pantoprazole (PROTONIX) 40 MG tablet Take 1 tablet (40 mg total) by mouth daily. 90 tablet 3  . traZODone (DESYREL) 50 MG tablet Take 1 tablet (50 mg total) by mouth at bedtime as needed for sleep. 15 tablet 0   No current facility-administered medications for this visit.    Allergies: Flexeril [cyclobenzaprine], Lisinopril, Other, Robaxin [methocarbamol], Contrast media [iodinated diagnostic agents], Gabapentin,  and Lyrica [pregabalin]  Past Medical History:  Diagnosis Date  . ACHILLES TENDINITIS   . ALLERGIC RHINITIS   . Allergy   . Anginal pain ([place]) 04/04/2016   Pt currently feels like she is having muscle spasms and aching in her chest  . Anxiety   . Asthma   . Chronic kidney disease   . GERD   . History of kidney stones   . HYPERTENSION   . LATERAL EPICONDYLITIS, RIGHT   . LOW BACK PAIN   .  Plantar fascial fibromatosis   . SUBACROMIAL BURSITIS, RIGHT   . Thoracic scoliosis 10/01/2015  . TIA (transient ischemic attack)     Past Surgical History:  Procedure Laterality Date  . ANTERIOR CERVICAL DECOMP/DISCECTOMY FUSION N/A 04/07/2016   Procedure: Cervical two-three Anterior cervical decompression/discectomy/fusion;  Surgeon: Ashok Pall, MD;  Location: [facility];  Service: Neurosurgery;  Laterality: N/A;  . BACK SURGERY  2015   lower back  . EXTRACORPOREAL SHOCK WAVE LITHOTRIPSY Left 09/28/2016   Procedure: LEFT EXTRACORPOREAL SHOCK WAVE LITHOTRIPSY (ESWL);  Surgeon: Festus Aloe, MD;  Location[facility];  Service: Urology;  Laterality: Left;  . EXTRACORPOREAL SHOCK WAVE LITHOTRIPSY Right 04/08/2018   Procedure: EXTRACORPOREAL SHOCK WAVE LITHOTRIPSY (ESWL);  Surgeon: Ardis Hughs, MD;  Location[facility];  Service: Urology;  Laterality: Right;  . KIDNEY SURGERY    . LITHOTRIPSY  05/2016  . TUBAL LIGATION      Family History  Problem Relation Age of Onset  . Cancer Sister        colon cancer  . Colon cancer Sister   . Diabetes Mother   . Cancer Father        Prostate  . Colon cancer Father   . Breast cancer Maternal Aunt        50's  . Esophageal cancer Neg Hx   . Stomach cancer Neg Hx     Social History   Tobacco Use  . Smoking status: Current Every Day Smoker    Packs/day: 0.50    Years: 20.00    Pack years: 10.00    Types: Cigarettes  . Smokeless tobacco: Never Used  Substance Use Topics  . Alcohol use: No    After multiple calls and texts was unable to reach patient at number provided; she will need to schedule a follow-up with her PCP about ongoing chronic care issues.

## 2019-10-21 ENCOUNTER — Telehealth: Payer: BC Managed Care – PPO | Admitting: Family

## 2019-10-22 ENCOUNTER — Ambulatory Visit: Payer: BC Managed Care – PPO | Admitting: Physical Therapy

## 2019-10-29 ENCOUNTER — Ambulatory Visit: Payer: BC Managed Care – PPO | Attending: Internal Medicine | Admitting: Physical Therapy

## 2019-10-29 ENCOUNTER — Other Ambulatory Visit: Payer: Self-pay

## 2019-10-29 ENCOUNTER — Encounter: Payer: Self-pay | Admitting: Physical Therapy

## 2019-10-29 DIAGNOSIS — M6281 Muscle weakness (generalized): Secondary | ICD-10-CM

## 2019-10-29 DIAGNOSIS — R482 Apraxia: Secondary | ICD-10-CM | POA: Diagnosis present

## 2019-10-29 DIAGNOSIS — R41841 Cognitive communication deficit: Secondary | ICD-10-CM | POA: Insufficient documentation

## 2019-10-29 DIAGNOSIS — M542 Cervicalgia: Secondary | ICD-10-CM | POA: Diagnosis present

## 2019-10-29 DIAGNOSIS — G8929 Other chronic pain: Secondary | ICD-10-CM | POA: Diagnosis present

## 2019-10-29 DIAGNOSIS — M545 Low back pain, unspecified: Secondary | ICD-10-CM

## 2019-10-29 DIAGNOSIS — R471 Dysarthria and anarthria: Secondary | ICD-10-CM | POA: Insufficient documentation

## 2019-10-29 NOTE — Therapy (Signed)
[place], [place], [address] Phone: [phone]   Fax:  [phone]  Physical Therapy Treatment  Patient Details  Name: [name] Walker MRN: [id number] Date of Birth: [birth year] Referring Provider (PT): Biagio Borg, MD   Encounter Date: 10/29/2019   PT End of Session - 10/29/19 1139    Visit Number 3    Number of Visits 8    Date for PT Re-Evaluation 12/02/19    Authorization Type BCBS    PT Start Time 1139   patient arrived late   PT Stop Time 1215    PT Time Calculation (min) 36 min    Activity Tolerance Patient tolerated treatment well    Behavior During Therapy [facility] for tasks assessed/performed           Past Medical History:  Diagnosis Date  . ACHILLES TENDINITIS   . ALLERGIC RHINITIS   . Allergy   . Anginal pain ([place]) 04/04/2016   Pt currently feels like she is having muscle spasms and aching in her chest  . Anxiety   . Asthma   . Chronic kidney disease   . GERD   . History of kidney stones   . HYPERTENSION   . LATERAL EPICONDYLITIS, RIGHT   . LOW BACK PAIN   . Plantar fascial fibromatosis   . SUBACROMIAL BURSITIS, RIGHT   . Thoracic scoliosis 10/01/2015  . TIA (transient ischemic attack)     Past Surgical History:  Procedure Laterality Date  . ANTERIOR CERVICAL DECOMP/DISCECTOMY FUSION N/A 04/07/2016   Procedure: Cervical two-three Anterior cervical decompression/discectomy/fusion;  Surgeon: Ashok Pall, MD;  Location: [facility];  Service: Neurosurgery;  Laterality: N/A;  . BACK SURGERY  2015   lower back  . EXTRACORPOREAL SHOCK WAVE LITHOTRIPSY Left 09/28/2016   Procedure: LEFT EXTRACORPOREAL SHOCK WAVE LITHOTRIPSY (ESWL);  Surgeon: Festus Aloe, MD;  [facility];  Service: Urology;  Laterality: Left;  . EXTRACORPOREAL SHOCK WAVE LITHOTRIPSY Right 04/08/2018   Procedure: EXTRACORPOREAL SHOCK WAVE LITHOTRIPSY (ESWL);  Surgeon: Ardis Hughs, MD;  [facility];   Service: Urology;  Laterality: Right;  . KIDNEY SURGERY    . LITHOTRIPSY  05/2016  . TUBAL LIGATION      There were no vitals filed for this visit.   Subjective Assessment - 10/29/19 1142    Subjective Patient reports she had to cancel her last visit due to having a shot in her back that resulted in increased pain. She notes the left side of her back is still bothering her. She notes she is going to get another shot in her back on 11/03/2019. Patient reports that she sometime she feels like she loses her balance while walking around her house.    Patient Stated Goals Patient would like to be able to walk and exercise to feel better    Currently in Pain? Yes    Pain Score 6     Pain Location Back    Pain Orientation Lower;Left    Pain Descriptors / Indicators Aching    Pain Type Chronic pain    Pain Onset More than a month ago    Pain Frequency Constant              [facility] PT Assessment - 10/29/19 0001      Assessment   Medical Diagnosis Chronic left-sided low back pain without sciatica    Referring Provider (PT) Biagio Borg, MD      Strength   Overall  Strength Comments Patient exhibits poor core strength and difficulty with lumbopelvic control    Right Hip Extension 3+/5    Right Hip ABduction 3+/5    Left Hip Extension 3/5    Left Hip ABduction 3-/5                         [facility] Adult PT Treatment/Exercise - 10/29/19 0001      Exercises   Exercises Lumbar;Neck      Lumbar Exercises: Stretches   Piriformis Stretch 2 reps;20 seconds    Piriformis Stretch Limitations seated    Other Lumbar Stretch Exercise Child's pose stretch x20 seconds, 2x20 sec for left      Lumbar Exercises: Aerobic   Nustep L5 x 4 min (UE and LE)      Lumbar Exercises: Standing   Other Standing Lumbar Exercises Standing hip abduction, extension, marching x10 each      Lumbar Exercises: Supine   Bridge 10 reps;3 seconds   2 sets     Lumbar Exercises: Sidelying   Clam 15 reps    2 sets   Clam Limitations red band      Lumbar Exercises: Quadruped   Madcat/Old Horse 5 reps   2 sets   Straight Leg Raise 5 reps;3 seconds    Opposite Arm/Leg Raise 5 reps;3 seconds                  PT Education - 10/29/19 1139    Education Details HEP    Person(s) Educated Patient    Methods Explanation;Demonstration;Tactile cues;Verbal cues;Handout    Comprehension Verbalized understanding;Need further instruction;Returned demonstration;Verbal cues required;Tactile cues required            PT Short Term Goals - 10/07/19 1008      PT SHORT TERM GOAL #1   Title Patient will be I with initial HEP to progress with PT    Time 4    Period Weeks    Status New    Target Date 11/04/19      PT SHORT TERM GOAL #2   Title PT with review FOTO with patient and she will express understanding    Time 4    Period Weeks    Status New    Target Date 11/04/19      PT SHORT TERM GOAL #3   Title Patient will report resting pain level </= 5/10 to allow for improved functional mobility    Time 4    Period Weeks    Status New    Target Date 11/04/19             PT Long Term Goals - 10/07/19 1023      PT LONG TERM GOAL #1   Title Patient will be I with final HEP to maintain progress with PT    Time 8    Period Weeks    Status New    Target Date 12/02/19      PT LONG TERM GOAL #2   Title Patient will report improved functional level to </= 61% limitation on FOTO    Time 8    Period Weeks    Status New    Target Date 12/02/19      PT LONG TERM GOAL #3   Title Patient will exhibit improved gross core and hip strength to >/= 4/5 MMT bilaterally to improve tolerance for standing and walking tasks    Time 8    Period Weeks  Status New    Target Date 12/02/19      PT LONG TERM GOAL #4   Title Patient will be able to stand and walk > 1 hour with </= 3/10 pain level to allow for improved community access and shopping    Time 8    Period Weeks    Status New     Target Date 12/02/19      PT LONG TERM GOAL #5   Title Patient will be able to lift small objects from floor with </= 3/10 pain level to improve homemaking ability    Time 8    Period Weeks    Status New    Target Date 12/02/19                 Plan - 10/29/19 1140    Clinical Impression Statement Patient tolerated therapy well with no adverse effects. Continued progress of core and hip strengthening this visit with good tolerance, incorporating standing strengthening with balance to help with the occasional balance instability the patient feels. Patient did well with therapy this visit and HEP was progressed with good tolerance. She continues to report pain mainly when she is leaning forward or bent over and she was instructed this is likely due to poor strength and control placing greater stress on lower back. Patient would benefit from continued skilled PT to improve her mobility and reduce pain in order to maximize functional ability.    PT Treatment/Interventions ADLs/Self Care Home Management;Cryotherapy;Electrical Stimulation;Iontophoresis 4mg /ml Dexamethasone;Moist Heat;Traction;Neuromuscular re-education;Balance training;Therapeutic exercise;Therapeutic activities;Functional mobility training;Stair training;Gait training;Patient/family education;Manual techniques;Dry needling;Passive range of motion;Taping;Spinal Manipulations;Joint Manipulations    PT Next Visit Plan Assess HEP and progress PRN, manual/stretching for lumbar region, progress core and hip stretching as tolerated (patient likes a towel roll under lumbar region when lying supine)    PT Home Exercise Plan [email or web address] (see patient instructions)    Consulted and Agree with Plan of Care Patient           Patient will benefit from skilled therapeutic intervention in order to improve the following deficits and impairments:  Decreased range of motion, Difficulty walking, Postural dysfunction, Decreased strength, Decreased  mobility, Pain, Decreased activity tolerance, Impaired flexibility  Visit Diagnosis: Chronic bilateral low back pain, unspecified whether sciatica present  Cervicalgia  Muscle weakness (generalized)     Problem List Patient Active Problem List   Diagnosis Date Noted  . Difficulty with speech 10/02/2019  . Insomnia 10/02/2019  . TIA (transient ischemic attack) 09/15/2019  . HLD (hyperlipidemia) 04/14/2019  . Acute ear pain, bilateral 04/14/2019  . Vitamin D deficiency 04/14/2019  . Anxiety 10/06/2018  . Neck pain 09/10/2018  . Hand swelling 09/10/2018  . Low back pain 06/03/2018  . Bilateral radiating leg pain 04/23/2018  . Myalgia 04/23/2018  . Right renal stone 04/03/2018  . Smoker 04/29/2016  . Bilateral hand pain 04/29/2016  . HNP (herniated nucleus pulposus), cervical 04/07/2016  . Cervical radiculopathy, acute 01/29/2016  . Acute upper respiratory infection 01/29/2016  . Degenerative disc disease, cervical 01/11/2016  . Hyperglycemia 11/17/2015  . Angioedema 10/12/2015  . Bilateral lower extremity edema 10/12/2015  . Mouth sores 10/12/2015  . Hypokalemia 10/12/2015  . Thoracic scoliosis 10/01/2015  . Peripheral edema 10/01/2015  . Eczema 10/01/2015  . Asthma 10/01/2015  . Breast lump on right side at 4 o'clock position 01/21/2014  . Hot flashes 10/01/2013  . Lumbar radiculopathy 07/17/2012  . Grief reaction 07/17/2012  . Family history of colon cancer 07/02/2012  .  OAB (overactive bladder) 03/01/2012  . Encounter for well adult exam with abnormal findings 08/23/2010  . Plantar fasciitis 08/23/2010  . Dysuria 01/05/2010  . UTI (urinary tract infection) 10/19/2008  . ANKLE PAIN, BILATERAL 10/19/2008  . VAGINITIS 09/10/2008  . JOINT EFFUSION, ANKLE 09/10/2008  . SUBACROMIAL BURSITIS, RIGHT 09/01/2008  . ACHILLES TENDINITIS 09/01/2008  . LATERAL EPICONDYLITIS, RIGHT 08/18/2008  . Essential hypertension 07/12/2007  . Allergic rhinitis 07/12/2007  . GERD  07/12/2007  . Chronic low back pain 07/12/2007  . NEPHROLITHIASIS, HX OF 07/12/2007    [name] Walker, PT, DPT, LAT, ATC 10/29/19  1:16 PM Phone: [phone] Fax: [facility] [address] [place], [place], [address] Phone: [phone]   Fax:  [phone]  Name: [name] Walker MRN: [id number] Date of Birth: 1960-07-12

## 2019-10-29 NOTE — Patient Instructions (Signed)
Access Code: [email or web address] URL: [email or web address] Date: 10/29/2019 Prepared by: Hilda Blades  Exercises Supine Lower Trunk Rotation - 2 x daily - 7 x weekly - 5 reps - 10 seconds hold Child's Pose Stretch - 2 x daily - 7 x weekly - 2 reps - 20 seconds hold Cat-Camel - 2 x daily - 7 x weekly - 10 reps - 3 seconds hold Seated Piriformis Stretch - 2 x daily - 7 x weekly - 2 reps - 20 seconds hold Gentle Upper Trap Stretch - 2 x daily - 7 x weekly - 2 reps - 20 seconds hold Gentle Levator Scapulae Stretch - 2 x daily - 7 x weekly - 2 reps - 20 seconds hold Bridge - 1 x daily - 7 x weekly - 2 sets - 10 reps Clamshell with Resistance - 1 x daily - 7 x weekly - 2 sets - 15 reps Bird Dog - 1 x daily - 7 x weekly - 2 sets - 10 reps - 5 seconds hold Standing Row with Anchored Resistance - 1 x daily - 7 x weekly - 2 sets - 10 reps Scapular Retraction with Resistance Advanced - 1 x daily - 7 x weekly - 2 sets - 10 reps

## 2019-10-30 ENCOUNTER — Encounter: Payer: Self-pay | Admitting: Internal Medicine

## 2019-10-31 ENCOUNTER — Other Ambulatory Visit: Payer: Self-pay

## 2019-10-31 ENCOUNTER — Encounter: Payer: Self-pay | Admitting: Internal Medicine

## 2019-10-31 ENCOUNTER — Ambulatory Visit (INDEPENDENT_AMBULATORY_CARE_PROVIDER_SITE_OTHER): Payer: BC Managed Care – PPO | Admitting: Internal Medicine

## 2019-10-31 VITALS — BP 140/100 | HR 69 | Temp 97.8°F | Ht 63.0 in | Wt 155.0 lb

## 2019-10-31 DIAGNOSIS — R479 Unspecified speech disturbances: Secondary | ICD-10-CM

## 2019-10-31 DIAGNOSIS — F419 Anxiety disorder, unspecified: Secondary | ICD-10-CM

## 2019-10-31 DIAGNOSIS — R531 Weakness: Secondary | ICD-10-CM | POA: Diagnosis not present

## 2019-10-31 DIAGNOSIS — M545 Low back pain, unspecified: Secondary | ICD-10-CM

## 2019-10-31 DIAGNOSIS — G459 Transient cerebral ischemic attack, unspecified: Secondary | ICD-10-CM

## 2019-10-31 DIAGNOSIS — R2689 Other abnormalities of gait and mobility: Secondary | ICD-10-CM | POA: Insufficient documentation

## 2019-10-31 DIAGNOSIS — G8929 Other chronic pain: Secondary | ICD-10-CM

## 2019-10-31 DIAGNOSIS — R739 Hyperglycemia, unspecified: Secondary | ICD-10-CM

## 2019-10-31 DIAGNOSIS — I10 Essential (primary) hypertension: Secondary | ICD-10-CM

## 2019-10-31 MED ORDER — NICOTINE POLACRILEX 4 MG MT GUM: 4.0000 mg | CHEWING_GUM | OROMUCOSAL | 5 refills | Status: DC | PRN

## 2019-10-31 MED ORDER — NICOTINE POLACRILEX 4 MG MT LOZG: 4.0000 mg | LOZENGE | OROMUCOSAL | 5 refills | Status: DC | PRN

## 2019-10-31 MED ORDER — HYDROCODONE-ACETAMINOPHEN 5-325 MG PO TABS: 1.0000 | ORAL_TABLET | Freq: Four times a day (QID) | ORAL | 0 refills | Status: DC | PRN

## 2019-10-31 NOTE — Patient Instructions (Addendum)
Please take all new medication as prescribed - the gum or lozenges for helping quitting smoking  Please continue all other medications as before, and refills have been done if requested - the hydrocodone Please have the pharmacy call with any other refills you may need.  Please continue your efforts at being more active, low cholesterol diet, and weight control.  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

## 2019-10-31 NOTE — Progress Notes (Signed)
Subjective:    Patient ID: [name] Walker, female    DOB: [birth year], 60 y.o.   MRN: [id number]  HPI  Here with daughter who explains recent neurology visit per her seemed to suggest the pt persistent speech, memory/word finding, left sided weakness reduced mobility and balance difficulty was possibly pschologically related, and recent MRI neg except for emtpy sella suggested could be related to idiopathic intracranial htn.  Daughter states she recalls what sounds like an LP to check pressure but not sure about this per ortho?.  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  Pt continues to have recurring LBP without change in severity, bowel or bladder change, fever, wt loss,  worsening LE pain/numbness/weakness, gait change or falls, needs pain management referral and pain med bridge refill.  Also asks for rx for nicotine gum/lozenge to help with smoking cessation as insurance will pay for this.   Past Medical History:  Diagnosis Date   ACHILLES TENDINITIS    ALLERGIC RHINITIS    Allergy    Anginal pain ([place]) 04/04/2016   Pt currently feels like she is having muscle spasms and aching in her chest   Anxiety    Asthma    Chronic kidney disease    GERD    History of kidney stones    HYPERTENSION    LATERAL EPICONDYLITIS, RIGHT    LOW BACK PAIN    Plantar fascial fibromatosis    SUBACROMIAL BURSITIS, RIGHT    Thoracic scoliosis 10/01/2015   TIA (transient ischemic attack)    Past Surgical History:  Procedure Laterality Date   ANTERIOR CERVICAL DECOMP/DISCECTOMY FUSION N/A 04/07/2016   Procedure: Cervical two-three Anterior cervical decompression/discectomy/fusion;  Surgeon: Ashok Pall, MD;  Location: [facility];  Service: Neurosurgery;  Laterality: N/A;   BACK SURGERY  2015   lower back    EXTRACORPOREAL SHOCK WAVE LITHOTRIPSY Left 09/28/2016   Procedure: LEFT EXTRACORPOREAL SHOCK WAVE LITHOTRIPSY (ESWL);  Surgeon: Festus Aloe, MD;  [facility];  Service: Urology;  Laterality: Left;   EXTRACORPOREAL SHOCK WAVE LITHOTRIPSY Right 04/08/2018   Procedure: EXTRACORPOREAL SHOCK WAVE LITHOTRIPSY (ESWL);  Surgeon: Ardis Hughs, MD;  [facility];  Service: Urology;  Laterality: Right;   KIDNEY SURGERY     LITHOTRIPSY  05/2016   TUBAL LIGATION      reports that she has been smoking cigarettes. She has a 10.00 pack-year smoking history. She has never used smokeless tobacco. She reports that she does not drink alcohol and does not use drugs. family history includes Breast cancer in her maternal aunt; Cancer in her father and sister; Colon cancer in her father and sister; Diabetes in her mother. Allergies  Allergen Reactions   Flexeril [Cyclobenzaprine] Swelling    Tongue swells   Lisinopril Other (See Comments)    ? Possible tongue swelling    Other Palpitations    ALL MUSCLE RELAXERS-PER PATIENT   Robaxin [Methocarbamol] Other (See Comments)    Tongue swelling   Contrast Media [Iodinated Diagnostic Agents] Itching   Gabapentin Other (See Comments)   Lyrica [Pregabalin]     Anxiety attacks   Current Outpatient Medications on File Prior to Visit  Medication Sig Dispense Refill   acetaminophen (TYLENOL) 500 MG tablet Take 1,000 mg by mouth every 6 (six) hours as needed  for mild pain.     amLODipine (NORVASC) 5 MG tablet Take 1 tablet (5 mg total) by mouth daily. 90 tablet 3   aspirin EC 81 MG tablet Take 1 tablet (81 mg total) by mouth daily. Swallow whole. 30 tablet 11   atorvastatin (LIPITOR) 10 MG tablet Take 1 tablet (10 mg total) by mouth daily. 90 tablet 3   cephALEXin (KEFLEX) 500 MG capsule Take by mouth.     cetirizine (ZYRTEC) 10 MG tablet Take 10 mg by mouth daily as needed for allergies.     citalopram (CELEXA) 10 MG tablet Take 1  tablet (10 mg total) by mouth daily. 30 tablet 2   LORazepam (ATIVAN) 0.5 MG tablet Take 1 tablet (0.5 mg total) by mouth 2 (two) times daily as needed. for anxiety 60 tablet 2   pantoprazole (PROTONIX) 40 MG tablet Take 1 tablet (40 mg total) by mouth daily. 90 tablet 3   traZODone (DESYREL) 50 MG tablet Take 1 tablet (50 mg total) by mouth at bedtime as needed for sleep. 15 tablet 0   losartan (COZAAR) 50 MG tablet Take 50 mg by mouth daily.     No current facility-administered medications on file prior to visit.   Review of Systems All otherwise neg per pt    Objective:   Physical Exam BP (!) 140/100 (BP Location: Left Arm, Patient Position: Sitting, Cuff Size: Large)    Pulse 69    Temp 97.8 F (36.6 C) (Oral)    Ht 5\' 3"  (1.6 m)    Wt 155 lb (70.3 kg)    LMP 08/22/2010    SpO2 98%    BMI 27.46 kg/m  VS noted,  Constitutional: Pt appears in NAD HENT: Head: NCAT.  Right Ear: External ear normal.  Left Ear: External ear normal.  Eyes: . Pupils are equal, round, and reactive to light. Conjunctivae and EOM are normal Nose: without d/c or deformity Neck: Neck supple. Gross normal ROM Cardiovascular: Normal rate and regular rhythm.   Pulmonary/Chest: Effort normal and breath sounds without rales or wheezing.  Abd:  Soft, NT, ND, + BS, no organomegaly Neurological: Pt is alert. At baseline orientation, motor grossly intact Skin: Skin is warm. No rashes, other new lesions, no LE edema Psychiatric: Pt behavior is normal without agitation  All otherwise neg per pt Lab Results  Component Value Date   WBC 12.6 (H) 09/30/2019   HGB 15.6 (H) 09/30/2019   HCT 46.0 09/30/2019   PLT 212 09/30/2019   GLUCOSE 99 09/30/2019   CHOL 195 10/03/2018   TRIG 200.0 (H) 10/03/2018   HDL 39.50 10/03/2018   LDLDIRECT 117.0 09/06/2017   LDLCALC 116 (H) 10/03/2018   ALT 33 09/30/2019   AST 20 09/30/2019   NA 145 09/30/2019   K 3.3 (L) 09/30/2019   CL 104 09/30/2019   CREATININE 0.50  09/30/2019   BUN 12 09/30/2019   CO2 27 09/30/2019   TSH 0.47 09/23/2019   INR 0.9 09/30/2019   HGBA1C 5.3 09/23/2019      Assessment & Plan:

## 2019-11-01 ENCOUNTER — Other Ambulatory Visit: Payer: Self-pay

## 2019-11-01 ENCOUNTER — Emergency Department (HOSPITAL_COMMUNITY)
Admission: EM | Admit: 2019-11-01 | Discharge: 2019-11-02 | Disposition: A | Discharge status: Home / Self Care | Payer: BC Managed Care – PPO | Attending: Emergency Medicine | Admitting: Emergency Medicine

## 2019-11-01 DIAGNOSIS — Y998 Other external cause status: Secondary | ICD-10-CM | POA: Insufficient documentation

## 2019-11-01 DIAGNOSIS — Z23 Encounter for immunization: Secondary | ICD-10-CM | POA: Diagnosis not present

## 2019-11-01 DIAGNOSIS — Z79899 Other long term (current) drug therapy: Secondary | ICD-10-CM | POA: Diagnosis not present

## 2019-11-01 DIAGNOSIS — Z7982 Long term (current) use of aspirin: Secondary | ICD-10-CM | POA: Insufficient documentation

## 2019-11-01 DIAGNOSIS — N189 Chronic kidney disease, unspecified: Secondary | ICD-10-CM | POA: Insufficient documentation

## 2019-11-01 DIAGNOSIS — J45909 Unspecified asthma, uncomplicated: Secondary | ICD-10-CM | POA: Diagnosis not present

## 2019-11-01 DIAGNOSIS — S80861A Insect bite (nonvenomous), right lower leg, initial encounter: Secondary | ICD-10-CM | POA: Insufficient documentation

## 2019-11-01 DIAGNOSIS — W57XXXA Bitten or stung by nonvenomous insect and other nonvenomous arthropods, initial encounter: Secondary | ICD-10-CM | POA: Insufficient documentation

## 2019-11-01 DIAGNOSIS — F17210 Nicotine dependence, cigarettes, uncomplicated: Secondary | ICD-10-CM | POA: Diagnosis not present

## 2019-11-01 DIAGNOSIS — Y9289 Other specified places as the place of occurrence of the external cause: Secondary | ICD-10-CM | POA: Diagnosis not present

## 2019-11-01 DIAGNOSIS — T63444A Toxic effect of venom of bees, undetermined, initial encounter: Secondary | ICD-10-CM

## 2019-11-01 DIAGNOSIS — I129 Hypertensive chronic kidney disease with stage 1 through stage 4 chronic kidney disease, or unspecified chronic kidney disease: Secondary | ICD-10-CM | POA: Insufficient documentation

## 2019-11-01 DIAGNOSIS — Y9389 Activity, other specified: Secondary | ICD-10-CM | POA: Diagnosis not present

## 2019-11-01 DIAGNOSIS — Z7951 Long term (current) use of inhaled steroids: Secondary | ICD-10-CM | POA: Insufficient documentation

## 2019-11-01 NOTE — ED Triage Notes (Addendum)
Pt was stung by a bee today and pt said when she lays down she feels like her throat is closing. Pt is able to swallow and eat fine. Pt just says when she lays back it feels like she cant catch her breath. No obvious swelling

## 2019-11-02 ENCOUNTER — Encounter: Payer: Self-pay | Admitting: Internal Medicine

## 2019-11-02 MED ORDER — ALBUTEROL SULFATE HFA 108 (90 BASE) MCG/ACT IN AERS: 2.0000 | INHALATION_SPRAY | Freq: Four times a day (QID) | RESPIRATORY_TRACT | 0 refills | Status: DC | PRN

## 2019-11-02 MED ORDER — TETANUS-DIPHTH-ACELL PERTUSSIS 5-2.5-18.5 LF-MCG/0.5 IM SUSP
0.5000 mL | Freq: Once | INTRAMUSCULAR | Status: AC
  Administered 2019-11-02: 0.5 mL via INTRAMUSCULAR
  Filled 2019-11-02: qty 0.5

## 2019-11-02 NOTE — Assessment & Plan Note (Signed)
Chronic, for pain management referral

## 2019-11-02 NOTE — ED Notes (Signed)
See PA assessment.  PT seen in FT

## 2019-11-02 NOTE — Assessment & Plan Note (Signed)
For neruo as above

## 2019-11-02 NOTE — Assessment & Plan Note (Signed)
stable overall by history and exam, recent data reviewed with pt, and pt to continue medical treatment as before,  to f/u any worsening symptoms or concerns  

## 2019-11-02 NOTE — Assessment & Plan Note (Signed)
chornic stable,  to f/u any worsening symptoms or concerns

## 2019-11-02 NOTE — ED Notes (Signed)
Pt waiting to speak with PA regarding inhaler

## 2019-11-02 NOTE — ED Provider Notes (Signed)
[facility] EMERGENCY DEPARTMENT Provider Note   CSN: [id number] Arrival date & time: 11/01/19  2324     History Chief Complaint  Patient presents with  . Insect Bite    [name] Walker is a 59 y.o. female.  59 year old female presents with complaint of right lower leg bee sting.  Patient states that when she went to bed last night every time she tried to lay down she felt her throat was closing.  Patient states that this happens often with her reflux however due to the bee sting she became concerned, took Benadryl, called her doctor and was advised to go to the emergency room.  After an extensive 11-hour wait in the emergency room, patient reports irritation at the sting site however no other complaints at this time.  Denies ever having hives or diffuse itching, shortness of breath, wheezing, cough.  No other complaints or concerns.  Last tetanus unknown.        Past Medical History:  Diagnosis Date  . ACHILLES TENDINITIS   . ALLERGIC RHINITIS   . Allergy   . Anginal pain ([place]) 04/04/2016   Pt currently feels like she is having muscle spasms and aching in her chest  . Anxiety   . Asthma   . Chronic kidney disease   . GERD   . History of kidney stones   . HYPERTENSION   . LATERAL EPICONDYLITIS, RIGHT   . LOW BACK PAIN   . Plantar fascial fibromatosis   . SUBACROMIAL BURSITIS, RIGHT   . Thoracic scoliosis 10/01/2015  . TIA (transient ischemic attack)     Patient Active Problem List   Diagnosis Date Noted  . Left-sided weakness 10/31/2019  . Balance disorder 10/31/2019  . Difficulty with speech 10/02/2019  . Insomnia 10/02/2019  . TIA (transient ischemic attack) 09/15/2019  . HLD (hyperlipidemia) 04/14/2019  . Acute ear pain, bilateral 04/14/2019  . Vitamin D deficiency 04/14/2019  . Anxiety 10/06/2018  . Neck pain 09/10/2018  . Hand swelling 09/10/2018  . Low back pain 06/03/2018  . Bilateral radiating leg pain 04/23/2018  . Myalgia 04/23/2018   . Right renal stone 04/03/2018  . Smoker 04/29/2016  . Bilateral hand pain 04/29/2016  . HNP (herniated nucleus pulposus), cervical 04/07/2016  . Cervical radiculopathy, acute 01/29/2016  . Acute upper respiratory infection 01/29/2016  . Degenerative disc disease, cervical 01/11/2016  . Hyperglycemia 11/17/2015  . Angioedema 10/12/2015  . Bilateral lower extremity edema 10/12/2015  . Mouth sores 10/12/2015  . Hypokalemia 10/12/2015  . Thoracic scoliosis 10/01/2015  . Peripheral edema 10/01/2015  . Eczema 10/01/2015  . Asthma 10/01/2015  . Breast lump on right side at 4 o'clock position 01/21/2014  . Hot flashes 10/01/2013  . Lumbar radiculopathy 07/17/2012  . Grief reaction 07/17/2012  . Family history of colon cancer 07/02/2012  . OAB (overactive bladder) 03/01/2012  . Encounter for well adult exam with abnormal findings 08/23/2010  . Plantar fasciitis 08/23/2010  . Dysuria 01/05/2010  . UTI (urinary tract infection) 10/19/2008  . ANKLE PAIN, BILATERAL 10/19/2008  . VAGINITIS 09/10/2008  . JOINT EFFUSION, ANKLE 09/10/2008  . SUBACROMIAL BURSITIS, RIGHT 09/01/2008  . ACHILLES TENDINITIS 09/01/2008  . LATERAL EPICONDYLITIS, RIGHT 08/18/2008  . Essential hypertension 07/12/2007  . Allergic rhinitis 07/12/2007  . GERD 07/12/2007  . Chronic low back pain 07/12/2007  . NEPHROLITHIASIS, HX OF 07/12/2007    Past Surgical History:  Procedure Laterality Date  . ANTERIOR CERVICAL DECOMP/DISCECTOMY FUSION N/A 04/07/2016   Procedure: Cervical  two-three Anterior cervical decompression/discectomy/fusion;  Surgeon: Ashok Pall, MD;  Location: [facility];  Service: Neurosurgery;  Laterality: N/A;  . BACK SURGERY  2015   lower back  . EXTRACORPOREAL SHOCK WAVE LITHOTRIPSY Left 09/28/2016   Procedure: LEFT EXTRACORPOREAL SHOCK WAVE LITHOTRIPSY (ESWL);  Surgeon: Festus Aloe, MD;  Location: [facility];  Service: Urology;  Laterality: Left;  . EXTRACORPOREAL SHOCK WAVE LITHOTRIPSY Right  04/08/2018   Procedure: EXTRACORPOREAL SHOCK WAVE LITHOTRIPSY (ESWL);  Surgeon: Ardis Hughs, MD;  Location: [facility];  Service: Urology;  Laterality: Right;  . KIDNEY SURGERY    . LITHOTRIPSY  05/2016  . TUBAL LIGATION       OB History    Gravida  3   Para  3   Term      Preterm      AB      Living  3     SAB      TAB      Ectopic      Multiple      Live Births              Family History  Problem Relation Age of Onset  . Cancer Sister        colon cancer  . Colon cancer Sister   . Diabetes Mother   . Cancer Father        Prostate  . Colon cancer Father   . Breast cancer Maternal Aunt        50's  . Esophageal cancer Neg Hx   . Stomach cancer Neg Hx     Social History   Tobacco Use  . Smoking status: Current Every Day Smoker    Packs/day: 0.50    Years: 20.00    Pack years: 10.00    Types: Cigarettes  . Smokeless tobacco: Never Used  Vaping Use  . Vaping Use: Never used  Substance Use Topics  . Alcohol use: No  . Drug use: No    Home Medications Prior to Admission medications   Medication Sig Start Date End Date Taking? Authorizing Provider  acetaminophen (TYLENOL) 500 MG tablet Take 1,000 mg by mouth every 6 (six) hours as needed for mild pain.    [provider]  albuterol (PROVENTIL HFA;VENTOLIN HFA) 108 (90 Base) MCG/ACT inhaler Inhale 2 puffs into the lungs every 6 (six) hours as needed for wheezing or shortness of breath. 05/14/18   Biagio Borg, MD  amLODipine (NORVASC) 5 MG tablet Take 1 tablet (5 mg total) by mouth daily. 04/14/19   Biagio Borg, MD  aspirin EC 81 MG tablet Take 1 tablet (81 mg total) by mouth daily. Swallow whole. 09/15/19   Biagio Borg, MD  atorvastatin (LIPITOR) 10 MG tablet Take 1 tablet (10 mg total) by mouth daily. 09/15/19   Biagio Borg, MD  cephALEXin [facility]) 500 MG capsule Take by mouth. 09/25/19   [provider]  cetirizine (ZYRTEC) 10 MG tablet Take 10 mg by mouth daily as needed  for allergies.    [provider]  citalopram (CELEXA) 10 MG tablet Take 1 tablet (10 mg total) by mouth daily. 10/09/19   Garvin Fila, MD  HYDROcodone-acetaminophen (NORCO/VICODIN) 5-325 MG tablet Take 1 tablet by mouth every 6 (six) hours as needed for moderate pain. 10/31/19   Biagio Borg, MD  LORazepam (ATIVAN) 0.5 MG tablet Take 1 tablet (0.5 mg total) by mouth 2 (two) times daily as needed. for anxiety 10/02/19  Biagio Borg, MD  losartan (COZAAR) 50 MG tablet Take 50 mg by mouth daily. 10/10/19   [provider]  nicotine polacrilex (NICORETTE) 4 MG gum Take 1 each (4 mg total) by mouth as needed for smoking cessation. 10/31/19   Biagio Borg, MD  nicotine polacrilex (NICORETTE) 4 MG lozenge Take 1 lozenge (4 mg total) by mouth as needed for smoking cessation. 10/31/19   Biagio Borg, MD  pantoprazole (PROTONIX) 40 MG tablet Take 1 tablet (40 mg total) by mouth daily. 04/14/19   Biagio Borg, MD  traZODone (DESYREL) 50 MG tablet Take 1 tablet (50 mg total) by mouth at bedtime as needed for sleep. 09/30/19   Khatri, Hina, PA-C    Allergies    Flexeril [cyclobenzaprine], Lisinopril, Other, Robaxin [methocarbamol], Contrast media [iodinated diagnostic agents], Gabapentin, and Lyrica [pregabalin]  Review of Systems   Review of Systems  Constitutional: Negative for chills and fever.  HENT: Positive for trouble swallowing.   Respiratory: Negative for shortness of breath.   Cardiovascular: Negative for chest pain.  Gastrointestinal: Negative for nausea and vomiting.  Musculoskeletal: Negative for joint swelling.  Skin: Positive for rash and wound.  Neurological: Negative for weakness and numbness.  All other systems reviewed and are negative.   Physical Exam Updated Vital Signs BP (!) 119/98 (BP Location: Left Arm)   Pulse (!) 58   Temp 97.8 F (36.6 C) (Oral)   Resp 12   LMP 08/22/2010   SpO2 100%   Physical Exam Vitals and nursing note reviewed.   Constitutional:      General: She is not in acute distress.    Appearance: She is well-developed. She is not diaphoretic.  HENT:     Head: Normocephalic and atraumatic.     Mouth/Throat:     Mouth: Mucous membranes are moist.     Pharynx: Oropharynx is clear. Uvula midline. No oropharyngeal exudate, posterior oropharyngeal erythema or uvula swelling.  Cardiovascular:     Rate and Rhythm: Normal rate and regular rhythm.     Pulses: Normal pulses.     Heart sounds: Normal heart sounds.  Pulmonary:     Effort: Pulmonary effort is normal.     Breath sounds: Normal breath sounds.  Musculoskeletal:        General: Tenderness present.  Skin:    General: Skin is warm.     Findings: Erythema present.       Neurological:     Mental Status: She is alert and oriented to person, place, and time.  Psychiatric:        Behavior: Behavior normal.     ED Results / Procedures / Treatments   Labs (all labs ordered are listed, but only abnormal results are displayed) Labs Reviewed - No data to display  EKG None  Radiology No results found.  Procedures Procedures (including critical care time)  Medications Ordered in ED Medications  Tdap (BOOSTRIX) injection 0.5 mL (has no administration in time range)    ED Course  I have reviewed the triage vital signs and the nursing notes.  Pertinent labs & imaging results that were available during my care of the patient were reviewed by me and considered in my medical decision making (see chart for details).  Clinical Course as of Nov 02 1039  Sun Nov 01, 4725  [id number] 59 year old female presents after bee sting to right lower leg.  After an 11-hour wait in the emergency room, vital signs are stable, patient is feeling  well and is well-appearing.  Doubt anaphylaxis at this point.  Patient was placed in the supine position, symptoms do not recur.  Patient already took Benadryl prior to arrival.  Advised she can continue with Benadryl, Zyrtec,  Pepcid.  Will update tetanus and have her follow-up with PCP.   [LM]    Clinical Course User Index [LM] Roque Lias   MDM Rules/Calculators/A&P                          Final Clinical Impression(s) / ED Diagnoses Final diagnoses:  Bee sting, undetermined intent, initial encounter    Rx / DC Orders ED Discharge Orders    None       Tacy Learn, PA-C 11/02/19 1041    Charlesetta Shanks, MD 11/02/19 1554

## 2019-11-02 NOTE — Assessment & Plan Note (Signed)
For neuro as above

## 2019-11-02 NOTE — Assessment & Plan Note (Addendum)
Pt has overall neurological d/o undefined it seems, ok for referral second opinion  I spent 41 minutes in preparing to see the patient by review of recent labs, imaging and procedures, obtaining and reviewing separately obtained history, communicating with the patient and family or caregiver, ordering medications, tests or procedures, and documenting clinical information in the EHR including the differential Dx, treatment, and any further evaluation and other management of speech difficulty, balance issue, left sided weakness, non TIA, htn, hyperglycemia, chronic lbp, anxiety, depression

## 2019-11-02 NOTE — Assessment & Plan Note (Signed)
Chronic stable, for pain management referral, and bridge pain med refill today

## 2019-11-02 NOTE — Discharge Instructions (Addendum)
Continue with Benadryl.  He may also take Pepcid and Zyrtec.  You may apply cortisone cream to the area as needed. Return to ER for severe or concerning symptoms otherwise follow-up with your PCP.

## 2019-11-02 NOTE — Assessment & Plan Note (Signed)
Not felt to have TIA per Dr Leonie Man per pt report

## 2019-11-05 ENCOUNTER — Encounter: Payer: Self-pay | Admitting: Internal Medicine

## 2019-11-05 ENCOUNTER — Ambulatory Visit: Payer: BC Managed Care – PPO | Admitting: Physical Therapy

## 2019-11-05 ENCOUNTER — Telehealth: Payer: Self-pay | Admitting: Internal Medicine

## 2019-11-05 ENCOUNTER — Telehealth (INDEPENDENT_AMBULATORY_CARE_PROVIDER_SITE_OTHER): Payer: BC Managed Care – PPO | Admitting: Internal Medicine

## 2019-11-05 DIAGNOSIS — R531 Weakness: Secondary | ICD-10-CM

## 2019-11-05 NOTE — Telephone Encounter (Signed)
The list has been printed and handed to the PCC's to set up an appointment for the pt.

## 2019-11-05 NOTE — Telephone Encounter (Signed)
Ok to forward to [facility], thanks

## 2019-11-05 NOTE — Progress Notes (Signed)
Patient ID: [name] Walker, female   DOB: 04/30/1960, 59 y.o.   MRN: [id number]  Virtual Visit via Video Note  I connected with [name] Walker on 11/05/19 at  1:00 PM EDT by a video enabled telemedicine application and verified that I am speaking with the correct person using two identifiers.  Location of all participants today Patient: at home Provider: at office   I discussed the limitations of evaluation and management by telemedicine and the availability of in person appointments. The patient expressed understanding and agreed to proceed.  History of Present Illness: Pt here to mention no change in symptoms and hoping for second opinion neurology appt asap.  Pt denies chest pain, increased sob or doe, wheezing, orthopnea, PND, increased LE swelling, palpitations, dizziness or syncope.  Pt denies new neurological symptoms such as new headache, or facial or extremity weakness or numbness except for that mentioned last visit.  Left leg recently tried to buckle but no fall.  Needs 'stroke pt' put on SCAT form when filled out.   Pt denies fever, wt loss, night sweats, loss of appetite, or other constitutional symptoms Past Medical History:  Diagnosis Date  . ACHILLES TENDINITIS   . ALLERGIC RHINITIS   . Allergy   . Anginal pain ([place]) 04/04/2016   Pt currently feels like she is having muscle spasms and aching in her chest  . Anxiety   . Asthma   . Chronic kidney disease   . GERD   . History of kidney stones   . HYPERTENSION   . LATERAL EPICONDYLITIS, RIGHT   . LOW BACK PAIN   . Plantar fascial fibromatosis   . SUBACROMIAL BURSITIS, RIGHT   . Thoracic scoliosis 10/01/2015  . TIA (transient ischemic attack)    Past Surgical History:  Procedure Laterality Date  . ANTERIOR CERVICAL DECOMP/DISCECTOMY FUSION N/A 04/07/2016   Procedure: Cervical two-three Anterior cervical decompression/discectomy/fusion;  Surgeon: Ashok Pall, MD;  Location: [facility];  Service: Neurosurgery;  Laterality:  N/A;  . BACK SURGERY  2015   lower back  . EXTRACORPOREAL SHOCK WAVE LITHOTRIPSY Left 09/28/2016   Procedure: LEFT EXTRACORPOREAL SHOCK WAVE LITHOTRIPSY (ESWL);  Surgeon: Festus Aloe, MD;  Location: [facility];  Service: Urology;  Laterality: Left;  . EXTRACORPOREAL SHOCK WAVE LITHOTRIPSY Right 04/08/2018   Procedure: EXTRACORPOREAL SHOCK WAVE LITHOTRIPSY (ESWL);  Surgeon: Ardis Hughs, MD;  Location: [facility];  Service: Urology;  Laterality: Right;  . KIDNEY SURGERY    . LITHOTRIPSY  05/2016  . TUBAL LIGATION      reports that she has been smoking cigarettes. She has a 10.00 pack-year smoking history. She has never used smokeless tobacco. She reports that she does not drink alcohol and does not use drugs. family history includes Breast cancer in her maternal aunt; Cancer in her father and sister; Colon cancer in her father and sister; Diabetes in her mother. Allergies  Allergen Reactions  . Flexeril [Cyclobenzaprine] Swelling    Tongue swells  . Lisinopril Other (See Comments)    ? Possible tongue swelling   . Other Palpitations    ALL MUSCLE RELAXERS-PER PATIENT  . Robaxin [Methocarbamol] Other (See Comments)    Tongue swelling  . Contrast Media [Iodinated Diagnostic Agents] Itching  . Gabapentin Other (See Comments)  . Lyrica [Pregabalin]     Anxiety attacks   Current Outpatient Medications on File Prior to Visit  Medication Sig Dispense Refill  . acetaminophen (TYLENOL) 500 MG tablet Take 1,000 mg by mouth every 6 (six)  hours as needed for mild pain.    [place] Kitchen albuterol (VENTOLIN HFA) 108 (90 Base) MCG/ACT inhaler Inhale 2 puffs into the lungs every 6 (six) hours as needed for wheezing or shortness of breath. 18 g 0  . amLODipine (NORVASC) 5 MG tablet Take 1 tablet (5 mg total) by mouth daily. 90 tablet 3  . aspirin EC 81 MG tablet Take 1 tablet (81 mg total) by mouth daily. Swallow whole. 30 tablet 11  . atorvastatin (LIPITOR) 10 MG tablet Take 1 tablet (10 mg total) by mouth  daily. 90 tablet 3  . cephALEXin (KEFLEX) 500 MG capsule Take by mouth.    . cetirizine (ZYRTEC) 10 MG tablet Take 10 mg by mouth daily as needed for allergies.    . citalopram (CELEXA) 10 MG tablet Take 1 tablet (10 mg total) by mouth daily. 30 tablet 2  . HYDROcodone-acetaminophen (NORCO/VICODIN) 5-325 MG tablet Take 1 tablet by mouth every 6 (six) hours as needed for moderate pain. 60 tablet 0  . LORazepam (ATIVAN) 0.5 MG tablet Take 1 tablet (0.5 mg total) by mouth 2 (two) times daily as needed. for anxiety 60 tablet 2  . losartan (COZAAR) 50 MG tablet Take 50 mg by mouth daily.    . nicotine polacrilex (NICORETTE) 4 MG gum Take 1 each (4 mg total) by mouth as needed for smoking cessation. 100 tablet 5  . nicotine polacrilex (NICORETTE) 4 MG lozenge Take 1 lozenge (4 mg total) by mouth as needed for smoking cessation. 100 tablet 5  . pantoprazole (PROTONIX) 40 MG tablet Take 1 tablet (40 mg total) by mouth daily. 90 tablet 3  . traZODone (DESYREL) 50 MG tablet Take 1 tablet (50 mg total) by mouth at bedtime as needed for sleep. 15 tablet 0   No current facility-administered medications on file prior to visit.   Observations/Objective: Alert, NAD, appropriate mood and affect, resps normal, cn 2-12 intact, moves all 4s, no visible rash or swelling Lab Results  Component Value Date   WBC 12.6 (H) 09/30/2019   HGB 15.6 (H) 09/30/2019   HCT 46.0 09/30/2019   PLT 212 09/30/2019   GLUCOSE 99 09/30/2019   CHOL 195 10/03/2018   TRIG 200.0 (H) 10/03/2018   HDL 39.50 10/03/2018   LDLDIRECT 117.0 09/06/2017   LDLCALC 116 (H) 10/03/2018   ALT 33 09/30/2019   AST 20 09/30/2019   NA 145 09/30/2019   K 3.3 (L) 09/30/2019   CL 104 09/30/2019   CREATININE 0.50 09/30/2019   BUN 12 09/30/2019   CO2 27 09/30/2019   TSH 0.47 09/23/2019   INR 0.9 09/30/2019   HGBA1C 5.3 09/23/2019   Assessment and Plan: See notes  Follow Up Instructions: See notes   I discussed the assessment and treatment  plan with the patient. The patient was provided an opportunity to ask questions and all were answered. The patient agreed with the plan and demonstrated an understanding of the instructions.   The patient was advised to call back or seek an in-person evaluation if the symptoms worsen or if the condition fails to improve as anticipated.  Cathlean Cower, MD

## 2019-11-05 NOTE — Telephone Encounter (Signed)
Error

## 2019-11-05 NOTE — Telephone Encounter (Signed)
Please forward to both PCCs or print off the message to give to them if need to do that, thanks

## 2019-11-10 ENCOUNTER — Encounter: Payer: Self-pay | Admitting: Internal Medicine

## 2019-11-10 ENCOUNTER — Other Ambulatory Visit: Payer: Self-pay

## 2019-11-10 ENCOUNTER — Ambulatory Visit: Payer: BC Managed Care – PPO

## 2019-11-10 DIAGNOSIS — R41841 Cognitive communication deficit: Secondary | ICD-10-CM

## 2019-11-10 DIAGNOSIS — R471 Dysarthria and anarthria: Secondary | ICD-10-CM

## 2019-11-10 DIAGNOSIS — R482 Apraxia: Secondary | ICD-10-CM

## 2019-11-10 DIAGNOSIS — M545 Low back pain: Secondary | ICD-10-CM | POA: Diagnosis not present

## 2019-11-10 NOTE — Therapy (Signed)
[facility] [address] [place], [place], [address] Phone: [phone]   Fax:  [phone]  Speech Language Pathology Evaluation  Patient Details  Name: [name] Walker MRN: [id number] Date of Birth: Nov 25, 1960 Referring Provider (SLP): Cathlean Cower, MD   Encounter Date: 11/10/2019   End of Session - 11/10/19 1702    Visit Number 1    Number of Visits 17    Date for SLP Re-Evaluation 02/06/20    SLP Start Time 0851    SLP Stop Time  0931    SLP Time Calculation (min) 40 min    Activity Tolerance Patient tolerated treatment well           Past Medical History:  Diagnosis Date  . ACHILLES TENDINITIS   . ALLERGIC RHINITIS   . Allergy   . Anginal pain ([place]) 04/04/2016   Pt currently feels like she is having muscle spasms and aching in her chest  . Anxiety   . Asthma   . Chronic kidney disease   . GERD   . History of kidney stones   . HYPERTENSION   . LATERAL EPICONDYLITIS, RIGHT   . LOW BACK PAIN   . Plantar fascial fibromatosis   . SUBACROMIAL BURSITIS, RIGHT   . Thoracic scoliosis 10/01/2015  . TIA (transient ischemic attack)     Past Surgical History:  Procedure Laterality Date  . ANTERIOR CERVICAL DECOMP/DISCECTOMY FUSION N/A 04/07/2016   Procedure: Cervical two-three Anterior cervical decompression/discectomy/fusion;  Surgeon: Ashok Pall, MD;  Location: [facility];  Service: Neurosurgery;  Laterality: N/A;  . BACK SURGERY  2015   lower back  . EXTRACORPOREAL SHOCK WAVE LITHOTRIPSY Left 09/28/2016   Procedure: LEFT EXTRACORPOREAL SHOCK WAVE LITHOTRIPSY (ESWL);  Surgeon: Festus Aloe, MD;  Location: [facility];  Service: Urology;  Laterality: Left;  . EXTRACORPOREAL SHOCK WAVE LITHOTRIPSY Right 04/08/2018   Procedure: EXTRACORPOREAL SHOCK WAVE LITHOTRIPSY (ESWL);  Surgeon: Ardis Hughs, MD;  Location: [facility];  Service: Urology;  Laterality: Right;  . KIDNEY SURGERY    . LITHOTRIPSY  05/2016  . TUBAL  LIGATION      There were no vitals filed for this visit.   Subjective Assessment - 11/10/19 0857    Subjective "Saying 'you don't look like you had a stroke.' What am I supposed to look like?" (pt re: previous neurologist)    Patient is accompained by: Family member   husband             SLP Evaluation [facility] - 11/10/19 0900      SLP Visit Information   SLP Received On 11/10/19    Referring Provider (SLP) Cathlean Cower, MD    Onset Date Mid-July 2021    Medical Diagnosis Difficulty speaking      Subjective   Subjective "(My speech) slower in the mornings and at night." "I'm more emotional now - I just start crying."    Patient/Family Stated Goal Improve speech function      Pain Assessment   Currently in Pain? No/denies      General Information   HPI Pt had symptoms of jaw spasm, stuttering speech in [place], [place] after going to the ED for back pain. "I was just thinking it was a spasm - it lasted 30-45 minutes" Pt also reports headache. Brain imaging approx 7 days later did not reveal anything abnormal. Pt also states she has difficulty with short term memory which started upon return to [place], sometimes repeating things she has already  told husband.       Prior Functional Status   Cognitive/Linguistic Baseline Within functional limits    Type of Home House     Lives With Family    Available Support Family;Friend(s)    Vocation On disability      Cognition   Overall Cognitive Status Impaired/Different from baseline   reported   Area of Impairment Memory   reported - testing to follow PRN      Auditory Comprehension   Overall Auditory Comprehension Appears within functional limits for tasks assessed      Verbal Expression   Overall Verbal Expression Appears within functional limits for tasks assessed      Oral Motor/Sensory Function   Overall Oral Motor/Sensory Function Impaired    Labial ROM Within Functional Limits    Labial Symmetry Within Functional Limits     Labial Strength Reduced Left    Labial Coordination Reduced   sluggish   Lingual Symmetry Abnormal symmetry right   slight tongue deviation to    Lingual Strength Reduced Left    Lingual Sensation Within Functional Limits    Lingual Coordination Reduced    Facial Sensation Reduced   "Tingly" intermittently   Overall Oral Motor/Sensory Function Orally, appears pt is weaker on lt than rt, and coordination with lingual and labial musculature is decr'd in both speech and nonspeech tasks.     Motor Speech   Overall Motor Speech Impaired    Articulation Impaired    Level of Impairment Conversation    Intelligibility Intelligible    Motor Planning Impaired    Level of Impairment Conversation    Motor Speech Errors Aware;Consistent   reduced speech rate   Effective Techniques Slow rate                           SLP Education - 11/10/19 1701    Education Details HEP procedure, eval results - pt speaking slower than WNL, showing lt oral weakness, will work on things to make talking easier for her, memory testing PRN    Person(s) Educated Patient;Spouse    Methods Explanation;Demonstration    Comprehension Verbalized understanding            SLP Short Term Goals - 11/10/19 1712      SLP SHORT TERM GOAL #1   Title pt will perform HEP for articulatory precision with rare min A over 3 sessions    Time 4    Period Weeks    Status New      SLP SHORT TERM GOAL #2   Title pt will report less laborious speech than at eval by 25%    Time 4    Period Weeks    Status New      SLP SHORT TERM GOAL #3   Title pt will undergo further cognitive-linguistic testing PRN    Time 2    Period Weeks   or session #5   Status New            SLP Long Term Goals - 11/10/19 1714      SLP LONG TERM GOAL #1   Title pt will perform HEP for speech articulatory precision with modified independence in 3 sessions    Time 8    Period Weeks   or 17 sessions - for all LTGs   Status New       SLP LONG TERM GOAL #2   Title pt will report greater speech  QOL than at session #2, in one of her last 2 ST sessions    Time 8    Period Weeks    Status New            Plan - 11/10/19 1703    Clinical Impression Statement Pt presents with lt labial and lingual weakness, and lingual deviation to rt (slight). Pt speech rate slower than WNL. Non-speech lingual and labial coordination was abnormal (suggesting possible verbal apraxia) with lingual movement in circular pattern on lips- pt rarely moved tongue to upper lip. Labial AMR of pucker-retract also lacked precision. Pt has second opinion with neurologist Dr.Olukayode Jay Schlichter in mid-October. Pt also reports decr'd short term memory since her episode in [place] in mid-July. SLP recommends skilled ST to make speech less laborious and more like speech at her PLOF. Pt's short term memory will be assessed PRN and goals may be added for this as well.    Speech Therapy Frequency 2x / week    Duration 8 weeks   or 17 total sessions   Treatment/Interventions Oral motor exercises;Functional tasks;Compensatory techniques;SLP instruction and feedback;Cognitive reorganization;Patient/family education;Internal/external aids    Potential to Achieve Goals Fair    Potential Considerations Other (comment)   ? diagnosis   Consulted and Agree with Plan of Care Patient           Patient will benefit from skilled therapeutic intervention in order to improve the following deficits and impairments:   Dysarthria and anarthria  Verbal apraxia  Cognitive communication deficit    Problem List Patient Active Problem List   Diagnosis Date Noted  . Left-sided weakness 10/31/2019  . Balance disorder 10/31/2019  . Difficulty with speech 10/02/2019  . Insomnia 10/02/2019  . TIA (transient ischemic attack) 09/15/2019  . HLD (hyperlipidemia) 04/14/2019  . Acute ear pain, bilateral 04/14/2019  . Vitamin D deficiency 04/14/2019  . Anxiety  10/06/2018  . Neck pain 09/10/2018  . Hand swelling 09/10/2018  . Bilateral radiating leg pain 04/23/2018  . Myalgia 04/23/2018  . Right renal stone 04/03/2018  . Smoker 04/29/2016  . Bilateral hand pain 04/29/2016  . HNP (herniated nucleus pulposus), cervical 04/07/2016  . Cervical radiculopathy, acute 01/29/2016  . Acute upper respiratory infection 01/29/2016  . Degenerative disc disease, cervical 01/11/2016  . Hyperglycemia 11/17/2015  . Angioedema 10/12/2015  . Bilateral lower extremity edema 10/12/2015  . Mouth sores 10/12/2015  . Hypokalemia 10/12/2015  . Thoracic scoliosis 10/01/2015  . Peripheral edema 10/01/2015  . Eczema 10/01/2015  . Asthma 10/01/2015  . Breast lump on right side at 4 o'clock position 01/21/2014  . Hot flashes 10/01/2013  . Lumbar radiculopathy 07/17/2012  . Grief reaction 07/17/2012  . Family history of colon cancer 07/02/2012  . OAB (overactive bladder) 03/01/2012  . Encounter for well adult exam with abnormal findings 08/23/2010  . Plantar fasciitis 08/23/2010  . Dysuria 01/05/2010  . UTI (urinary tract infection) 10/19/2008  . ANKLE PAIN, BILATERAL 10/19/2008  . VAGINITIS 09/10/2008  . JOINT EFFUSION, ANKLE 09/10/2008  . SUBACROMIAL BURSITIS, RIGHT 09/01/2008  . ACHILLES TENDINITIS 09/01/2008  . LATERAL EPICONDYLITIS, RIGHT 08/18/2008  . Essential hypertension 07/12/2007  . Allergic rhinitis 07/12/2007  . GERD 07/12/2007  . Chronic low back pain 07/12/2007  . NEPHROLITHIASIS, HX OF 07/12/2007    [facility] ,[place], CCC-SLP  11/10/2019, 5:19 PM  [facility] [address] [place], [place], [address] Phone: [phone]   Fax:  [phone]  Name: CHINAZA ROOKE MRN: [id number]  Date of Birth: [birth year]

## 2019-11-10 NOTE — Patient Instructions (Signed)
  Speech Exercises  Repeat 3 times, 2 times a day  Call the cat "Buttercup" A calendar of [place], [place] Four floors to cover Yellow oil ointment Fellow lovers of felines Catastrophe in [place]' plums The church's chimes chimed Telling time 'til eleven Five valve levers Keep the gate closed Go see that guy Fat cows give milk [employer] Gophers Fat frogs flip freely Kohl's into bed Get that game to [employer] Thick thistles stick together Cinnamon aluminum linoleum Black bugs blood Lovely lemon linament Red leather, yellow leather  Big grocery buggy    Purple baby carriage [facility] Proper copper coffee pot Ripe purple cabbage Three free throws [employer] tackled  [employer] dipped the dessert  D[employer] that [employer] of [employer] Shirts shrink, shells shouldn't [place] 49ers Take the tackle box File the flash message Give me five flapjacks Fundamental relatives Dye the pets purple Talking [place] time after time Dark chocolate chunks Political landscape of the kingdom Estate manager/land agent genius We played yo-yos yesterday

## 2019-11-10 NOTE — Assessment & Plan Note (Addendum)
Along with balance issue, speech issue and memory issue - for second opinion neurology asap - referral already in place  I spent 21 minutes in preparing to see the patient by review of recent labs, imaging and procedures, obtaining and reviewing separately obtained history, communicating with the patient and family or caregiver, ordering medications, tests or procedures, and documenting clinical information in the EHR including the differential Dx, treatment, and any further evaluation and other management of neurological symptoms unexplained

## 2019-11-10 NOTE — Patient Instructions (Signed)
Please continue all other medications as before, and refills have been done if requested.  Please have the pharmacy call with any other refills you may need.  Please continue your efforts at being more active, low cholesterol diet, and weight control.  Please keep your appointments with your specialists as you may have planned     

## 2019-11-11 ENCOUNTER — Other Ambulatory Visit: Payer: Self-pay | Admitting: Internal Medicine

## 2019-11-11 NOTE — Telephone Encounter (Signed)
    Chris from [employer] calling to request nicotine polacrilex (NICORETTE) 4 MG gum nicotine polacrilex (NICORETTE) 4 MG lozenge be resent to [facility] on file. Gerald Stabs states they do not have order.  BCBS plan covers gum as prescription, patient should not purchase as OTC

## 2019-11-11 NOTE — Telephone Encounter (Signed)
LVM with pt instructing to call back for clarification as the Nicorette went to a [facility] [place] as a prescription med.

## 2019-11-12 ENCOUNTER — Encounter: Payer: Self-pay | Admitting: Speech Pathology

## 2019-11-12 ENCOUNTER — Other Ambulatory Visit: Payer: Self-pay

## 2019-11-12 ENCOUNTER — Ambulatory Visit: Payer: BC Managed Care – PPO | Admitting: Speech Pathology

## 2019-11-12 DIAGNOSIS — R482 Apraxia: Secondary | ICD-10-CM

## 2019-11-12 DIAGNOSIS — R471 Dysarthria and anarthria: Secondary | ICD-10-CM

## 2019-11-12 DIAGNOSIS — M545 Low back pain: Secondary | ICD-10-CM | POA: Diagnosis not present

## 2019-11-12 NOTE — Therapy (Signed)
[facility] [address] [place], [place], [address] Phone: [phone]   Fax:  [phone]  Speech Language Pathology Treatment  Patient Details  Name: [name] Walker MRN: [id number] Date of Birth: 1960/12/16 Referring Provider (SLP): Cathlean Cower, MD   Encounter Date: 11/12/2019   End of Session - 11/12/19 1247    Visit Number 2    Number of Visits 17    Date for SLP Re-Evaluation 02/06/20    SLP Start Time 1146    SLP Stop Time  1230    SLP Time Calculation (min) 44 min    Activity Tolerance Patient tolerated treatment well           Past Medical History:  Diagnosis Date  . ACHILLES TENDINITIS   . ALLERGIC RHINITIS   . Allergy   . Anginal pain ([place]) 04/04/2016   Pt currently feels like she is having muscle spasms and aching in her chest  . Anxiety   . Asthma   . Chronic kidney disease   . GERD   . History of kidney stones   . HYPERTENSION   . LATERAL EPICONDYLITIS, RIGHT   . LOW BACK PAIN   . Plantar fascial fibromatosis   . SUBACROMIAL BURSITIS, RIGHT   . Thoracic scoliosis 10/01/2015  . TIA (transient ischemic attack)     Past Surgical History:  Procedure Laterality Date  . ANTERIOR CERVICAL DECOMP/DISCECTOMY FUSION N/A 04/07/2016   Procedure: Cervical two-three Anterior cervical decompression/discectomy/fusion;  Surgeon: Ashok Pall, MD;  Location: [facility];  Service: Neurosurgery;  Laterality: N/A;  . BACK SURGERY  2015   lower back  . EXTRACORPOREAL SHOCK WAVE LITHOTRIPSY Left 09/28/2016   Procedure: LEFT EXTRACORPOREAL SHOCK WAVE LITHOTRIPSY (ESWL);  Surgeon: Festus Aloe, MD;  [facility];  Service: Urology;  Laterality: Left;  . EXTRACORPOREAL SHOCK WAVE LITHOTRIPSY Right 04/08/2018   Procedure: EXTRACORPOREAL SHOCK WAVE LITHOTRIPSY (ESWL);  Surgeon: Ardis Hughs, MD;  [facility];  Service: Urology;  Laterality: Right;  . KIDNEY SURGERY    . LITHOTRIPSY  05/2016  . TUBAL  LIGATION      There were no vitals filed for this visit.   Subjective Assessment - 11/12/19 1154    Subjective "The hospital said I had a stroke, but the doctor said I didn't"    Currently in Pain? No/denies                 ADULT SLP TREATMENT - 11/12/19 1155      General Information   Behavior/Cognition Alert;Cooperative;Pleasant mood      Treatment Provided   Treatment provided Cognitive-Linquistic      Cognitive-Linquistic Treatment   Treatment focused on Dysarthria;Patient/family/caregiver education    Skilled Treatment Pt enters room with slowed rate of speech with abnormal prosody, which is distracting, however, [name] is intelligible. Structured speech tasks focusing on multisyllabic words and increasing rate slightly for more normal prosody, while still speaking slow enough to reduce motor planning errors. Pt reqiured usual min cues to achieve this. Added to HEP rhyming sentences. Trained pt in strategies for energy conservation as intellibility reduces at night.       Assessment / Recommendations / Plan   Plan Continue with current plan of care      Progression Toward Goals   Progression toward goals Progressing toward goals            SLP Education - 11/12/19 1241    Education Details HEP for motor speech, compensations  for intellibility, energy conservation    Person(s) Educated Patient    Methods Explanation;Demonstration;Verbal cues;Handout    Comprehension Returned demonstration;Verbal cues required;Need further instruction            SLP Short Term Goals - 11/12/19 1247      SLP SHORT TERM GOAL #1   Title pt will perform HEP for articulatory precision with rare min A over 3 sessions    Time 4    Period Weeks    Status On-going      SLP SHORT TERM GOAL #2   Title pt will report less laborious speech than at eval by 25%    Time 4    Period Weeks    Status On-going      SLP SHORT TERM GOAL #3   Title pt will undergo further  cognitive-linguistic testing PRN    Time 2    Period Weeks   or session #5   Status On-going            SLP Long Term Goals - 11/12/19 1247      SLP LONG TERM GOAL #1   Title pt will perform HEP for speech articulatory precision with modified independence in 3 sessions    Time 8    Period Weeks   or 17 sessions - for all LTGs   Status On-going      SLP LONG TERM GOAL #2   Title pt will report greater speech QOL than at session #2, in one of her last 2 ST sessions    Time 8    Period Weeks    Status On-going            Plan - 11/12/19 1246    Clinical Impression Statement Pt presents with lt labial and lingual weakness, and lingual deviation to rt (slight). Pt speech rate slower than WNL. Non-speech lingual and labial coordination was abnormal (suggesting possible verbal apraxia) with lingual movement in circular pattern on lips- pt rarely moved tongue to upper lip. Labial AMR of pucker-retract also lacked precision. Pt has second opinion with neurologist Dr.Olukayode Jay Schlichter in mid-October. Pt also reports decr'd short term memory since her episode in [place] in mid-July. SLP recommends skilled ST to make speech less laborious and more like speech at her PLOF. Pt's short term memory will be assessed PRN and goals may be added for this as well.    Speech Therapy Frequency 2x / week    Duration --   8 weeks or 17 total visits   Treatment/Interventions Oral motor exercises;Functional tasks;Compensatory techniques;SLP instruction and feedback;Cognitive reorganization;Patient/family education;Internal/external aids    Potential to Achieve Goals Fair    Potential Considerations Other (comment)   etiology unknown          Patient will benefit from skilled therapeutic intervention in order to improve the following deficits and impairments:   Verbal apraxia  Dysarthria and anarthria    Problem List Patient Active Problem List   Diagnosis Date Noted  . Left-sided  weakness 10/31/2019  . Balance disorder 10/31/2019  . Difficulty with speech 10/02/2019  . Insomnia 10/02/2019  . TIA (transient ischemic attack) 09/15/2019  . HLD (hyperlipidemia) 04/14/2019  . Acute ear pain, bilateral 04/14/2019  . Vitamin D deficiency 04/14/2019  . Anxiety 10/06/2018  . Neck pain 09/10/2018  . Hand swelling 09/10/2018  . Bilateral radiating leg pain 04/23/2018  . Myalgia 04/23/2018  . Right renal stone 04/03/2018  . Smoker 04/29/2016  . Bilateral hand pain 04/29/2016  .  HNP (herniated nucleus pulposus), cervical 04/07/2016  . Cervical radiculopathy, acute 01/29/2016  . Acute upper respiratory infection 01/29/2016  . Degenerative disc disease, cervical 01/11/2016  . Hyperglycemia 11/17/2015  . Angioedema 10/12/2015  . Bilateral lower extremity edema 10/12/2015  . Mouth sores 10/12/2015  . Hypokalemia 10/12/2015  . Thoracic scoliosis 10/01/2015  . Peripheral edema 10/01/2015  . Eczema 10/01/2015  . Asthma 10/01/2015  . Breast lump on right side at 4 o'clock position 01/21/2014  . Hot flashes 10/01/2013  . Lumbar radiculopathy 07/17/2012  . Grief reaction 07/17/2012  . Family history of colon cancer 07/02/2012  . OAB (overactive bladder) 03/01/2012  . Encounter for well adult exam with abnormal findings 08/23/2010  . Plantar fasciitis 08/23/2010  . Dysuria 01/05/2010  . UTI (urinary tract infection) 10/19/2008  . ANKLE PAIN, BILATERAL 10/19/2008  . VAGINITIS 09/10/2008  . JOINT EFFUSION, ANKLE 09/10/2008  . SUBACROMIAL BURSITIS, RIGHT 09/01/2008  . ACHILLES TENDINITIS 09/01/2008  . LATERAL EPICONDYLITIS, RIGHT 08/18/2008  . Essential hypertension 07/12/2007  . Allergic rhinitis 07/12/2007  . GERD 07/12/2007  . Chronic low back pain 07/12/2007  . NEPHROLITHIASIS, HX OF 07/12/2007    [facility] Knee, Annye Rusk MS, CCC-SLP 11/12/2019, 12:48 PM  [facility] [address] [place], [place],  [address] Phone: [phone]   Fax:  [phone]   Name: [name] Walker MRN: [id number] Date of Birth: 11-Jan-1961

## 2019-11-12 NOTE — Patient Instructions (Signed)
   When you are talking try to have a quiet background (mute TV, radio, appliances)  Talk to people face to face  Get peoples attention before speaking   When you have to talk later in the evening, rest up before to preserve your speech and conserve your energy  If you have an event or meeting at night, rest during the day - you likely won't be able to have a full day, then have good speech at an evening event  You may find it helps your energy to split up errands throughout the week, rather than try to do a lot in 1 day  Over the phone, use good volume and slow rate  With your speech exercises, try to increase your rate to train your muscles to coordinate rapidly  If you are really having trouble with a word, say it syllable by syllable

## 2019-11-14 ENCOUNTER — Encounter: Payer: Self-pay | Admitting: Physical Therapy

## 2019-11-14 ENCOUNTER — Other Ambulatory Visit: Payer: Self-pay

## 2019-11-14 ENCOUNTER — Ambulatory Visit: Payer: BC Managed Care – PPO | Admitting: Physical Therapy

## 2019-11-14 DIAGNOSIS — M545 Low back pain, unspecified: Secondary | ICD-10-CM

## 2019-11-14 DIAGNOSIS — M542 Cervicalgia: Secondary | ICD-10-CM

## 2019-11-14 DIAGNOSIS — G8929 Other chronic pain: Secondary | ICD-10-CM

## 2019-11-14 DIAGNOSIS — M6281 Muscle weakness (generalized): Secondary | ICD-10-CM

## 2019-11-14 NOTE — Therapy (Signed)
[place], [place], [address] Phone: [phone]   Fax:  [phone]  Physical Therapy Treatment  Patient Details  Name: [name] Walker MRN: [id number] Date of Birth: 03-Jun-1960 Referring Provider (PT): Biagio Borg, MD   Encounter Date: 11/14/2019   PT End of Session - 11/14/19 1216    Visit Number 4    Number of Visits 8    Date for PT Re-Evaluation 12/02/19    Authorization Type BCBS    PT Start Time 1211    PT Stop Time 1253    PT Time Calculation (min) 42 min    Activity Tolerance Patient tolerated treatment well    Behavior During Therapy [employer] for tasks assessed/performed           Past Medical History:  Diagnosis Date  . ACHILLES TENDINITIS   . ALLERGIC RHINITIS   . Allergy   . Anginal pain ([place]) 04/04/2016   Pt currently feels like she is having muscle spasms and aching in her chest  . Anxiety   . Asthma   . Chronic kidney disease   . GERD   . History of kidney stones   . HYPERTENSION   . LATERAL EPICONDYLITIS, RIGHT   . LOW BACK PAIN   . Plantar fascial fibromatosis   . SUBACROMIAL BURSITIS, RIGHT   . Thoracic scoliosis 10/01/2015  . TIA (transient ischemic attack)     Past Surgical History:  Procedure Laterality Date  . ANTERIOR CERVICAL DECOMP/DISCECTOMY FUSION N/A 04/07/2016   Procedure: Cervical two-three Anterior cervical decompression/discectomy/fusion;  Surgeon: Ashok Pall, MD;  Location: [place];  Service: Neurosurgery;  Laterality: N/A;  . BACK SURGERY  2015   lower back  . EXTRACORPOREAL SHOCK WAVE LITHOTRIPSY Left 09/28/2016   Procedure: LEFT EXTRACORPOREAL SHOCK WAVE LITHOTRIPSY (ESWL);  Surgeon: Festus Aloe, MD;  Location: [facility];  Service: Urology;  Laterality: Left;  . EXTRACORPOREAL SHOCK WAVE LITHOTRIPSY Right 04/08/2018   Procedure: EXTRACORPOREAL SHOCK WAVE LITHOTRIPSY (ESWL);  Surgeon: Ardis Hughs, MD;  Location: [facility];  Service: Urology;   Laterality: Right;  . KIDNEY SURGERY    . LITHOTRIPSY  05/2016  . TUBAL LIGATION      There were no vitals filed for this visit.   Subjective Assessment - 11/14/19 1211    Subjective Patient reports she had an injection in her back and that is why she had to cancel her last visit. She was a little sore but is feeling better now.    Patient Stated Goals Patient would like to be able to walk and exercise to feel better    Currently in Pain? No/denies              [facility] PT Assessment - 11/14/19 0001      Observation/Other Assessments   Focus on Therapeutic Outcomes (FOTO)  50% limitation                         [facility] Adult PT Treatment/Exercise - 11/14/19 0001      Exercises   Exercises Lumbar;Neck      Neck Exercises: Theraband   Shoulder External Rotation 10 reps;Red   2 sets   Shoulder External Rotation Limitations seated    Horizontal ABduction 10 reps;Red   2 sets   Horizontal ABduction Limitations supine      Neck Exercises: Seated   Other Seated Exercise Shoulder blade squeezes focusing on posture  Lumbar Exercises: Stretches   Lower Trunk Rotation 5 reps;10 seconds    Piriformis Stretch 2 reps;20 seconds    Piriformis Stretch Limitations supine    Other Lumbar Stretch Exercise Child's pose stretch x20 seconds, 2x20 sec for left      Lumbar Exercises: Aerobic   Nustep L4 x 5 min (UE and LE)      Lumbar Exercises: Supine   Bridge 10 reps;3 seconds   2 sets   Straight Leg Raise 10 reps   2 sets     Lumbar Exercises: Quadruped   Madcat/Old Horse 5 reps   2 sets   Opposite Arm/Leg Raise 5 reps;3 seconds   2 sets     Neck Exercises: Stretches   Upper Trapezius Stretch 2 reps;20 seconds    Levator Stretch 2 reps;20 seconds                  PT Education - 11/14/19 1216    Education Details HEP    Person(s) Educated Patient    Methods Explanation    Comprehension Verbalized understanding;Need further instruction             PT Short Term Goals - 11/14/19 1227      PT SHORT TERM GOAL #1   Title Patient will be I with initial HEP to progress with PT    Time 4    Period Weeks    Status Achieved    Target Date 11/04/19      PT SHORT TERM GOAL #2   Title PT with review FOTO with patient and she will express understanding    Time 4    Period Weeks    Status Achieved    Target Date 11/04/19      PT SHORT TERM GOAL #3   Title Patient will report resting pain level </= 5/10 to allow for improved functional mobility    Time 4    Period Weeks    Status Achieved    Target Date 11/04/19             PT Long Term Goals - 11/14/19 1237      PT LONG TERM GOAL #1   Title Patient will be I with final HEP to maintain progress with PT    Time 8    Period Weeks    Status On-going    Target Date 12/02/19      PT LONG TERM GOAL #2   Title Patient will report improved functional level to </= 61% limitation on FOTO    Baseline 50% limitation    Time 8    Period Weeks    Status Achieved    Target Date 12/02/19      PT LONG TERM GOAL #3   Title Patient will exhibit improved gross core and hip strength to >/= 4/5 MMT bilaterally to improve tolerance for standing and walking tasks    Time 8    Period Weeks    Status On-going    Target Date 12/02/19      PT LONG TERM GOAL #4   Title Patient will be able to stand and walk > 1 hour with </= 3/10 pain level to allow for improved community access and shopping    Time 8    Period Weeks    Status On-going    Target Date 12/02/19      PT LONG TERM GOAL #5   Title Patient will be able to lift small objects from floor with </=  3/10 pain level to improve homemaking ability    Time 8    Period Weeks    Status On-going    Target Date 12/02/19                 Plan - 11/14/19 1217    Clinical Impression Statement Patient tolerated therapy well with no adverse effects. She tolerated greater level of exercise and able to lay supine this visit and did not  report any increase in lumbar pain. Did not change HEP this visit but did progress strengthening in session with good tolerance. She was encouraged to begin walking program to improve aerobic capacity and reduce pain. Patient would benefit from continued skilled PT to improve her mobility and reduce pain in order to maximize functional ability.    PT Treatment/Interventions ADLs/Self Care Home Management;Cryotherapy;Electrical Stimulation;Iontophoresis 4mg /ml Dexamethasone;Moist Heat;Traction;Neuromuscular re-education;Balance training;Therapeutic exercise;Therapeutic activities;Functional mobility training;Stair training;Gait training;Patient/family education;Manual techniques;Dry needling;Passive range of motion;Taping;Spinal Manipulations;Joint Manipulations    PT Next Visit Plan Assess HEP and progress PRN, manual/stretching for lumbar region, progress core and hip stretching as tolerated (patient likes a towel roll under lumbar region when lying supine)    PT Home Exercise Plan [id number] (see patient instructions)    Consulted and Agree with Plan of Care Patient           Patient will benefit from skilled therapeutic intervention in order to improve the following deficits and impairments:  Decreased range of motion, Difficulty walking, Postural dysfunction, Decreased strength, Decreased mobility, Pain, Decreased activity tolerance, Impaired flexibility  Visit Diagnosis: Chronic bilateral low back pain, unspecified whether sciatica present  Cervicalgia  Muscle weakness (generalized)     Problem List Patient Active Problem List   Diagnosis Date Noted  . Left-sided weakness 10/31/2019  . Balance disorder 10/31/2019  . Difficulty with speech 10/02/2019  . Insomnia 10/02/2019  . TIA (transient ischemic attack) 09/15/2019  . HLD (hyperlipidemia) 04/14/2019  . Acute ear pain, bilateral 04/14/2019  . Vitamin D deficiency 04/14/2019  . Anxiety 10/06/2018  . Neck pain 09/10/2018  . Hand  swelling 09/10/2018  . Bilateral radiating leg pain 04/23/2018  . Myalgia 04/23/2018  . Right renal stone 04/03/2018  . Smoker 04/29/2016  . Bilateral hand pain 04/29/2016  . HNP (herniated nucleus pulposus), cervical 04/07/2016  . Cervical radiculopathy, acute 01/29/2016  . Acute upper respiratory infection 01/29/2016  . Degenerative disc disease, cervical 01/11/2016  . Hyperglycemia 11/17/2015  . Angioedema 10/12/2015  . Bilateral lower extremity edema 10/12/2015  . Mouth sores 10/12/2015  . Hypokalemia 10/12/2015  . Thoracic scoliosis 10/01/2015  . Peripheral edema 10/01/2015  . Eczema 10/01/2015  . Asthma 10/01/2015  . Breast lump on right side at 4 o'clock position 01/21/2014  . Hot flashes 10/01/2013  . Lumbar radiculopathy 07/17/2012  . Grief reaction 07/17/2012  . Family history of colon cancer 07/02/2012  . OAB (overactive bladder) 03/01/2012  . Encounter for well adult exam with abnormal findings 08/23/2010  . Plantar fasciitis 08/23/2010  . Dysuria 01/05/2010  . UTI (urinary tract infection) 10/19/2008  . ANKLE PAIN, BILATERAL 10/19/2008  . VAGINITIS 09/10/2008  . JOINT EFFUSION, ANKLE 09/10/2008  . SUBACROMIAL BURSITIS, RIGHT 09/01/2008  . ACHILLES TENDINITIS 09/01/2008  . LATERAL EPICONDYLITIS, RIGHT 08/18/2008  . Essential hypertension 07/12/2007  . Allergic rhinitis 07/12/2007  . GERD 07/12/2007  . Chronic low back pain 07/12/2007  . NEPHROLITHIASIS, HX OF 07/12/2007    Hilda Blades, PT, DPT, LAT, ATC 11/14/19  12:53 PM Phone: [phone] Fax: [phone]  [place], [place], [address] Phone: [phone]   Fax:  [phone]  Name: [name] Walker MRN: [id number] Date of Birth: 06-30-1960

## 2019-11-17 ENCOUNTER — Ambulatory Visit: Payer: BC Managed Care – PPO | Admitting: Diagnostic Neuroimaging

## 2019-11-19 ENCOUNTER — Other Ambulatory Visit: Payer: Self-pay

## 2019-11-19 ENCOUNTER — Encounter: Payer: Self-pay | Admitting: Physical Therapy

## 2019-11-19 ENCOUNTER — Ambulatory Visit: Payer: BC Managed Care – PPO | Admitting: Physical Therapy

## 2019-11-19 DIAGNOSIS — M545 Low back pain, unspecified: Secondary | ICD-10-CM

## 2019-11-19 DIAGNOSIS — G8929 Other chronic pain: Secondary | ICD-10-CM

## 2019-11-19 DIAGNOSIS — M542 Cervicalgia: Secondary | ICD-10-CM

## 2019-11-19 DIAGNOSIS — M6281 Muscle weakness (generalized): Secondary | ICD-10-CM

## 2019-11-19 NOTE — Patient Instructions (Addendum)
Access Code: [email or web address] URL: [email or web address] Date: 11/19/2019 Prepared by: Hilda Blades  Exercises Supine Lower Trunk Rotation - 2 x daily - 7 x weekly - 5 reps - 10 seconds hold Child's Pose Stretch - 2 x daily - 7 x weekly - 2 reps - 20 seconds hold Cat-Camel - 2 x daily - 7 x weekly - 10 reps - 3 seconds hold Seated Piriformis Stretch - 2 x daily - 7 x weekly - 2 reps - 20 seconds hold Gentle Upper Trap Stretch - 2 x daily - 7 x weekly - 2 reps - 20 seconds hold Gentle Levator Scapulae Stretch - 2 x daily - 7 x weekly - 2 reps - 20 seconds hold Bridge - 1 x daily - 7 x weekly - 2 sets - 10 reps Clamshell with Resistance - 1 x daily - 7 x weekly - 2 sets - 15 reps Bird Dog - 1 x daily - 7 x weekly - 2 sets - 10 reps - 5 seconds hold Standing Row with Anchored Resistance - 1 x daily - 7 x weekly - 2 sets - 10 reps Scapular Retraction with Resistance Advanced - 1 x daily - 7 x weekly - 2 sets - 10 reps Romberg Stance with Eyes Closed - 1 x daily - 7 x weekly - 3 reps - 30 seconds hold Romberg Stance with Head Nods - 1 x daily - 7 x weekly - 2 reps - 30 seconds hold Romberg Stance with Head Rotation - 1 x daily - 7 x weekly - 3 sets - 10 reps - 30 seconds hold Seated Flexion Stretch with Swiss Ball - 1 x daily - 7 x weekly - 5 reps - 5 seconds hold Seated Thoracic Flexion and Rotation with Swiss Ball - 1 x daily - 7 x weekly - 5 reps - 5 seconds hold

## 2019-11-19 NOTE — Therapy (Addendum)
[place], [place], [address] Phone: [phone]   Fax:  [phone]  Physical Therapy Treatment / Discharge  Patient Details  Name: [name] Walker MRN: [id number] Date of Birth: 1960/03/19 Referring Provider (PT): Biagio Borg, MD   Encounter Date: 11/19/2019   PT End of Session - 11/19/19 1051    Visit Number 5    Number of Visits 8    Date for PT Re-Evaluation 12/02/19    Authorization Type BCBS    PT Start Time 1025    PT Stop Time 1115   patient requests to leave early   PT Time Calculation (min) 30 min    Activity Tolerance Patient tolerated treatment well    Behavior During Therapy [facility] for tasks assessed/performed           Past Medical History:  Diagnosis Date  . ACHILLES TENDINITIS   . ALLERGIC RHINITIS   . Allergy   . Anginal pain ([place]) 04/04/2016   Pt currently feels like she is having muscle spasms and aching in her chest  . Anxiety   . Asthma   . Chronic kidney disease   . GERD   . History of kidney stones   . HYPERTENSION   . LATERAL EPICONDYLITIS, RIGHT   . LOW BACK PAIN   . Plantar fascial fibromatosis   . SUBACROMIAL BURSITIS, RIGHT   . Thoracic scoliosis 10/01/2015  . TIA (transient ischemic attack)     Past Surgical History:  Procedure Laterality Date  . ANTERIOR CERVICAL DECOMP/DISCECTOMY FUSION N/A 04/07/2016   Procedure: Cervical two-three Anterior cervical decompression/discectomy/fusion;  Surgeon: Ashok Pall, MD;  Location: [place];  Service: Neurosurgery;  Laterality: N/A;  . BACK SURGERY  2015   lower back  . EXTRACORPOREAL SHOCK WAVE LITHOTRIPSY Left 09/28/2016   Procedure: LEFT EXTRACORPOREAL SHOCK WAVE LITHOTRIPSY (ESWL);  Surgeon: Festus Aloe, MD;  Location[facility];  Service: Urology;  Laterality: Left;  . EXTRACORPOREAL SHOCK WAVE LITHOTRIPSY Right 04/08/2018   Procedure: EXTRACORPOREAL SHOCK WAVE LITHOTRIPSY (ESWL);  Surgeon: Ardis Hughs, MD;   Location[facility];  Service: Urology;  Laterality: Right;  . KIDNEY SURGERY    . LITHOTRIPSY  05/2016  . TUBAL LIGATION      There were no vitals filed for this visit.   Subjective Assessment - 11/19/19 1048    Subjective Patient reports she is doing well, she is talking slower today. She does note having a fall the other day when she dropped her water bottle and reached down to pick it up and fell forward. Her back is still feeling good and she is consitent with exercises, she has started doing some arm exercises with 3 lb weights. Patient requests to leave early this visit due to having another appointment after this.    Patient Stated Goals Patient would like to be able to walk and exercise to feel better    Currently in Pain? No/denies                             [facility] Adult PT Treatment/Exercise - 11/19/19 0001      Neuro Re-ed    Neuro Re-ed Details  Rhombarg stance with EC 2 x 30 sec, with head nods 2 x 30 sec, with head turns 2 x 30 sec      Exercises   Exercises Lumbar;Neck      Lumbar Exercises: Stretches   Piriformis Stretch  2 reps;30 seconds    Piriformis Stretch Limitations seated    Other Lumbar Stretch Exercise Seated lumbar flexion stretch with physioball 5 x 5 sec fwd/lateral each      Lumbar Exercises: Aerobic   Nustep L5 x 5 min (UE and LE)                  PT Education - 11/19/19 1050    Education Details HEP, balance    Person(s) Educated Patient    Methods Explanation;Handout;Verbal cues    Comprehension Verbalized understanding;Need further instruction;Verbal cues required            PT Short Term Goals - 11/14/19 1227      PT SHORT TERM GOAL #1   Title Patient will be I with initial HEP to progress with PT    Time 4    Period Weeks    Status Achieved    Target Date 11/04/19      PT SHORT TERM GOAL #2   Title PT with review FOTO with patient and she will express understanding    Time 4    Period Weeks    Status  Achieved    Target Date 11/04/19      PT SHORT TERM GOAL #3   Title Patient will report resting pain level </= 5/10 to allow for improved functional mobility    Time 4    Period Weeks    Status Achieved    Target Date 11/04/19             PT Long Term Goals - 11/14/19 1237      PT LONG TERM GOAL #1   Title Patient will be I with final HEP to maintain progress with PT    Time 8    Period Weeks    Status On-going    Target Date 12/02/19      PT LONG TERM GOAL #2   Title Patient will report improved functional level to </= 61% limitation on FOTO    Baseline 50% limitation    Time 8    Period Weeks    Status Achieved    Target Date 12/02/19      PT LONG TERM GOAL #3   Title Patient will exhibit improved gross core and hip strength to >/= 4/5 MMT bilaterally to improve tolerance for standing and walking tasks    Time 8    Period Weeks    Status On-going    Target Date 12/02/19      PT LONG TERM GOAL #4   Title Patient will be able to stand and walk > 1 hour with </= 3/10 pain level to allow for improved community access and shopping    Time 8    Period Weeks    Status On-going    Target Date 12/02/19      PT LONG TERM GOAL #5   Title Patient will be able to lift small objects from floor with </= 3/10 pain level to improve homemaking ability    Time 8    Period Weeks    Status On-going    Target Date 12/02/19                 Plan - 11/19/19 1052    Clinical Impression Statement Patient tolerated therapy well with no adverse effects. Therapy cut short due to patient requesting to leave early. Worked on balance exercises this visit as patient did report feeling off balance and recent fall. She  did require occasional UE assist with balance and was provided exercises to perform at home. Patient would benefit from continued skilled PT to improve her mobility and reduce pain in order to maximize functional ability.    PT Treatment/Interventions ADLs/Self Care Home  Management;Cryotherapy;Electrical Stimulation;Iontophoresis 4mg /ml Dexamethasone;Moist Heat;Traction;Neuromuscular re-education;Balance training;Therapeutic exercise;Therapeutic activities;Functional mobility training;Stair training;Gait training;Patient/family education;Manual techniques;Dry needling;Passive range of motion;Taping;Spinal Manipulations;Joint Manipulations    PT Next Visit Plan Assess HEP and progress PRN, manual/stretching for lumbar region, progress core and hip stretching as tolerated (patient likes a towel roll under lumbar region when lying supine)    PT Home Exercise Plan [id number] (see patient instructions)    Consulted and Agree with Plan of Care Patient           Patient will benefit from skilled therapeutic intervention in order to improve the following deficits and impairments:  Decreased range of motion, Difficulty walking, Postural dysfunction, Decreased strength, Decreased mobility, Pain, Decreased activity tolerance, Impaired flexibility  Visit Diagnosis: Chronic bilateral low back pain, unspecified whether sciatica present  Cervicalgia  Muscle weakness (generalized)     Problem List Patient Active Problem List   Diagnosis Date Noted  . Left-sided weakness 10/31/2019  . Balance disorder 10/31/2019  . Difficulty with speech 10/02/2019  . Insomnia 10/02/2019  . TIA (transient ischemic attack) 09/15/2019  . HLD (hyperlipidemia) 04/14/2019  . Acute ear pain, bilateral 04/14/2019  . Vitamin D deficiency 04/14/2019  . Anxiety 10/06/2018  . Neck pain 09/10/2018  . Hand swelling 09/10/2018  . Bilateral radiating leg pain 04/23/2018  . Myalgia 04/23/2018  . Right renal stone 04/03/2018  . Smoker 04/29/2016  . Bilateral hand pain 04/29/2016  . HNP (herniated nucleus pulposus), cervical 04/07/2016  . Cervical radiculopathy, acute 01/29/2016  . Acute upper respiratory infection 01/29/2016  . Degenerative disc disease, cervical 01/11/2016  . Hyperglycemia  11/17/2015  . Angioedema 10/12/2015  . Bilateral lower extremity edema 10/12/2015  . Mouth sores 10/12/2015  . Hypokalemia 10/12/2015  . Thoracic scoliosis 10/01/2015  . Peripheral edema 10/01/2015  . Eczema 10/01/2015  . Asthma 10/01/2015  . Breast lump on right side at 4 o'clock position 01/21/2014  . Hot flashes 10/01/2013  . Lumbar radiculopathy 07/17/2012  . Grief reaction 07/17/2012  . Family history of colon cancer 07/02/2012  . OAB (overactive bladder) 03/01/2012  . Encounter for well adult exam with abnormal findings 08/23/2010  . Plantar fasciitis 08/23/2010  . Dysuria 01/05/2010  . UTI (urinary tract infection) 10/19/2008  . ANKLE PAIN, BILATERAL 10/19/2008  . VAGINITIS 09/10/2008  . JOINT EFFUSION, ANKLE 09/10/2008  . SUBACROMIAL BURSITIS, RIGHT 09/01/2008  . ACHILLES TENDINITIS 09/01/2008  . LATERAL EPICONDYLITIS, RIGHT 08/18/2008  . Essential hypertension 07/12/2007  . Allergic rhinitis 07/12/2007  . GERD 07/12/2007  . Chronic low back pain 07/12/2007  . NEPHROLITHIASIS, HX OF 07/12/2007    Hilda Blades, PT, DPT, LAT, ATC 11/19/19  11:16 AM Phone: [phone] Fax: [facility] [address] [place], [place], [address] Phone: [phone]   Fax:  [phone]  Name: [name] Walker MRN: [id number] Date of Birth: November 27, 1960    PHYSICAL THERAPY DISCHARGE SUMMARY  Visits from Start of Care: 5  Current functional level related to goals / functional outcomes: See above   Remaining deficits: See above   Education / Equipment: HEP  Plan:  Patient goals were partially met. Patient is being discharged due to not returning since the last visit.  ?????    Hilda Blades, PT, DPT, LAT, ATC 02/09/20  3:13 PM Phone: [phone] Fax: [phone]

## 2019-11-21 ENCOUNTER — Other Ambulatory Visit: Payer: Self-pay

## 2019-11-21 ENCOUNTER — Ambulatory Visit: Payer: BC Managed Care – PPO

## 2019-11-21 DIAGNOSIS — R471 Dysarthria and anarthria: Secondary | ICD-10-CM

## 2019-11-21 DIAGNOSIS — R41841 Cognitive communication deficit: Secondary | ICD-10-CM

## 2019-11-21 DIAGNOSIS — M545 Low back pain: Secondary | ICD-10-CM | POA: Diagnosis not present

## 2019-11-21 NOTE — Therapy (Signed)
[facility] [address] [place], [place], [address] Phone: [phone]   Fax:  [phone]  Speech Language Pathology Treatment  Patient Details  Name: [name] Walker MRN: [id number] Date of Birth: 01/26/1961 Referring Provider (SLP): Cathlean Cower, MD   Encounter Date: 11/21/2019   End of Session - 11/21/19 1121    Visit Number 3    Number of Visits 17    Date for SLP Re-Evaluation 02/06/20    SLP Start Time 20    SLP Stop Time  1100    SLP Time Calculation (min) 40 min    Activity Tolerance Patient tolerated treatment well           Past Medical History:  Diagnosis Date  . ACHILLES TENDINITIS   . ALLERGIC RHINITIS   . Allergy   . Anginal pain ([place]) 04/04/2016   Pt currently feels like she is having muscle spasms and aching in her chest  . Anxiety   . Asthma   . Chronic kidney disease   . GERD   . History of kidney stones   . HYPERTENSION   . LATERAL EPICONDYLITIS, RIGHT   . LOW BACK PAIN   . Plantar fascial fibromatosis   . SUBACROMIAL BURSITIS, RIGHT   . Thoracic scoliosis 10/01/2015  . TIA (transient ischemic attack)     Past Surgical History:  Procedure Laterality Date  . ANTERIOR CERVICAL DECOMP/DISCECTOMY FUSION N/A 04/07/2016   Procedure: Cervical two-three Anterior cervical decompression/discectomy/fusion;  Surgeon: Ashok Pall, MD;  Location: [facility];  Service: Neurosurgery;  Laterality: N/A;  . BACK SURGERY  2015   lower back  . EXTRACORPOREAL SHOCK WAVE LITHOTRIPSY Left 09/28/2016   Procedure: LEFT EXTRACORPOREAL SHOCK WAVE LITHOTRIPSY (ESWL);  Surgeon: Festus Aloe, MD;  Location: [facility];  Service: Urology;  Laterality: Left;  . EXTRACORPOREAL SHOCK WAVE LITHOTRIPSY Right 04/08/2018   Procedure: EXTRACORPOREAL SHOCK WAVE LITHOTRIPSY (ESWL);  Surgeon: Ardis Hughs, MD;  Location: [facility];  Service: Urology;  Laterality: Right;  . KIDNEY SURGERY    . LITHOTRIPSY  05/2016  . TUBAL  LIGATION      There were no vitals filed for this visit.   Subjective Assessment - 11/21/19 1022    Subjective "I fell twice since my last visit. I'm losing my balance."    Patient is accompained by: Family member    Currently in Pain? No/denies                 ADULT SLP TREATMENT - 11/21/19 1027      General Information   Behavior/Cognition Alert;Cooperative;Pleasant mood      Treatment Provided   Treatment provided Cognitive-Linquistic      Cognitive-Linquistic Treatment   Treatment focused on Dysarthria;Patient/family/caregiver education    Seward states she almost forgot this appointment today but a reminder came on her phone and she arrived on time. SLP suggested pt put appointments on calendar - especially those appointemtns outside of [place]. Pt agreed to do this. Pt reports she had two falls since her last ST visit. Reiterates that she has felt more weakness on her lt side (jaw, arm, hand, leg). Pt reports dropping things with her left hand-writes with her left hand as well. This is improved - pt states she has practiced handwriting. SLP worked with pt today on making her speech more fluid and a more WNL rate by working on [employer] and phrases. Pt with good to excellent success with HEP ("tongue twisters") with  speeding up these phrases. In spontaneous speech. pt speech rate slowed down slower than in tongue twisters but when SLP encouraged pt to repeat her last utterance and provided an example she sped up her utterances 80% of thet time. SLP told pt to try to "push a little bit" her speech rate in conversation. SLP encouraged pt to stop when she stated she gets "tonguetied" and repeat the whole utterance.       Assessment / Recommendations / Plan   Plan Continue with current plan of care      Progression Toward Goals   Progression toward goals Progressing toward goals              SLP Short Term Goals - 11/21/19 1123      SLP SHORT  TERM GOAL #1   Title pt will perform HEP for articulatory precision with rare min A over 3 sessions    Time 3    Period Weeks    Status On-going      SLP SHORT TERM GOAL #2   Title pt will report less laborious speech than at eval by 25%    Time 3    Period Weeks    Status On-going      SLP SHORT TERM GOAL #3   Title pt will undergo further cognitive-linguistic testing PRN    Time 1    Period Weeks   or session #5   Status On-going            SLP Long Term Goals - 11/21/19 1123      SLP LONG TERM GOAL #1   Title pt will perform HEP for speech articulatory precision with modified independence in 3 sessions    Time 7    Period Weeks   or 17 sessions - for all LTGs   Status On-going      SLP LONG TERM GOAL #2   Title pt will report greater speech QOL than at session #2, in one of her last 2 ST sessions    Time 7    Period Weeks    Status On-going            Plan - 11/21/19 1121    Clinical Impression Statement Pt presents with lt labial and lingual weakness, and lingual deviation to rt (slight). Pt speech rate slower than WNL, however pt's recitation of her phrases for home exercises was completed, with encourgement from SLP, with WNL verbal speed. Pt has second opinion with neurologist Dr.Olukayode Jay Schlichter next week. Pt also reports decr'd short term memory since her episode in [place] in mid-July. SLP recommends continued skilled ST to make speech less laborious and more like speech at her PLOF. Pt's short term memory will be assessed PRN and goals may be added for this as well.    Speech Therapy Frequency 2x / week    Duration --   8 weeks or 17 total visits   Treatment/Interventions Oral motor exercises;Functional tasks;Compensatory techniques;SLP instruction and feedback;Cognitive reorganization;Patient/family education;Internal/external aids    Potential to Achieve Goals Fair    Potential Considerations Other (comment)   etiology unknown           Patient will benefit from skilled therapeutic intervention in order to improve the following deficits and impairments:   Dysarthria and anarthria  Cognitive communication deficit    Problem List Patient Active Problem List   Diagnosis Date Noted  . Left-sided weakness 10/31/2019  . Balance disorder 10/31/2019  . Difficulty with speech  10/02/2019  . Insomnia 10/02/2019  . TIA (transient ischemic attack) 09/15/2019  . HLD (hyperlipidemia) 04/14/2019  . Acute ear pain, bilateral 04/14/2019  . Vitamin D deficiency 04/14/2019  . Anxiety 10/06/2018  . Neck pain 09/10/2018  . Hand swelling 09/10/2018  . Bilateral radiating leg pain 04/23/2018  . Myalgia 04/23/2018  . Right renal stone 04/03/2018  . Smoker 04/29/2016  . Bilateral hand pain 04/29/2016  . HNP (herniated nucleus pulposus), cervical 04/07/2016  . Cervical radiculopathy, acute 01/29/2016  . Acute upper respiratory infection 01/29/2016  . Degenerative disc disease, cervical 01/11/2016  . Hyperglycemia 11/17/2015  . Angioedema 10/12/2015  . Bilateral lower extremity edema 10/12/2015  . Mouth sores 10/12/2015  . Hypokalemia 10/12/2015  . Thoracic scoliosis 10/01/2015  . Peripheral edema 10/01/2015  . Eczema 10/01/2015  . Asthma 10/01/2015  . Breast lump on right side at 4 o'clock position 01/21/2014  . Hot flashes 10/01/2013  . Lumbar radiculopathy 07/17/2012  . Grief reaction 07/17/2012  . Family history of colon cancer 07/02/2012  . OAB (overactive bladder) 03/01/2012  . Encounter for well adult exam with abnormal findings 08/23/2010  . Plantar fasciitis 08/23/2010  . Dysuria 01/05/2010  . UTI (urinary tract infection) 10/19/2008  . ANKLE PAIN, BILATERAL 10/19/2008  . VAGINITIS 09/10/2008  . JOINT EFFUSION, ANKLE 09/10/2008  . SUBACROMIAL BURSITIS, RIGHT 09/01/2008  . ACHILLES TENDINITIS 09/01/2008  . LATERAL EPICONDYLITIS, RIGHT 08/18/2008  . Essential hypertension 07/12/2007  . Allergic rhinitis  07/12/2007  . GERD 07/12/2007  . Chronic low back pain 07/12/2007  . NEPHROLITHIASIS, HX OF 07/12/2007    [facility] ,MS, CCC-SLP  11/21/2019, 11:27 AM  [facility] [address] [place], [place], [address] Phone: [phone]   Fax:  [phone]   Name: RIELEY KHALSA MRN: [id number] Date of Birth: 1960/12/26

## 2019-11-21 NOTE — Patient Instructions (Signed)
Keep on pushing your talking a little faster when you're talking.  Consider resting during the day to save some energy

## 2019-11-25 ENCOUNTER — Ambulatory Visit: Payer: BC Managed Care – PPO

## 2019-11-26 ENCOUNTER — Telehealth: Payer: Self-pay | Admitting: Internal Medicine

## 2019-11-26 NOTE — Telephone Encounter (Signed)
Patient called and said that she is seeing blood in her urine, not sure if it is a kidney stone or an infection. She said she is having some back pain. She said that it started this morning and she is having some stomach pain.

## 2019-11-26 NOTE — Telephone Encounter (Signed)
Needs OV, any provider asap, or see UC

## 2019-11-26 NOTE — Telephone Encounter (Signed)
Sent to Dr. John to advise. 

## 2019-11-27 NOTE — Telephone Encounter (Signed)
Spoke with pt and informed her of next open avail for next provider is not until Monday. Pt has  Stated she has developed more sxs so I did advise pt to go to UC as Dr. Jenny Reichmann has stated.  Pt agreed and stated she will see about going to UC.

## 2019-11-28 ENCOUNTER — Other Ambulatory Visit: Payer: Self-pay

## 2019-11-28 ENCOUNTER — Telehealth: Payer: Self-pay | Admitting: Internal Medicine

## 2019-11-28 ENCOUNTER — Ambulatory Visit: Payer: BC Managed Care – PPO | Attending: Internal Medicine

## 2019-11-28 DIAGNOSIS — R482 Apraxia: Secondary | ICD-10-CM | POA: Insufficient documentation

## 2019-11-28 DIAGNOSIS — R471 Dysarthria and anarthria: Secondary | ICD-10-CM | POA: Diagnosis present

## 2019-11-28 DIAGNOSIS — R41841 Cognitive communication deficit: Secondary | ICD-10-CM

## 2019-11-28 NOTE — Telephone Encounter (Signed)
Patient requesting a liquid stool softener, bowels backed up from medicine

## 2019-11-28 NOTE — Telephone Encounter (Signed)
Unfortunately I am unaware of a liquid stool softner  Most patients can take colace 100 bid prn otc (stool softner, which is not a laxative)  Ok also for miralax 17 gm po qd prn otc if not already taking this as this is benign and often works as a laxative

## 2019-11-28 NOTE — Patient Instructions (Addendum)
  [name] Walker, today we talked about:  Check and see if [facility] has an [place] where you can talk to a counselor or a therapist for a number of times free of charge. Call to the nurse that's been in touch with you and talk to her about it.   You may want to talk to [name] Walker about going to a neurologist at [facility] in [place], or to [facility] in [place], or to [facility] in [place].   Normally, a person who has had a stroke does not have almost totally normal talking and then have difficulty talking within a few seconds of each other.   You said people are asking you to relax during the day. But that doesn't have to be falling asleep. We talked about listening to smooth jazz, classical, gospel - whatever you find relaxing- and just closing your eyes. Or playing sounds of the beach, or of rainfall and closing your eyes and think about being on the beach, or in a little shelter with rain falling gently around you. You could also pray, or meditate on some meaningful words (scripture). Don't watch TV to relax.

## 2019-11-28 NOTE — Therapy (Signed)
[facility] [address] [place], [place], [address] Phone: [phone]   Fax:  [phone]  Speech Language Pathology Treatment  Patient Details  Name: [name] Walker MRN: [id number] Date of Birth: 1961-01-04 Referring Provider (SLP): Cathlean Cower, MD   Encounter Date: 11/28/2019   End of Session - 11/28/19 1306    Visit Number 4    Number of Visits 17    Date for SLP Re-Evaluation 02/06/20    SLP Start Time 1104    SLP Stop Time  1145    SLP Time Calculation (min) 41 min    Activity Tolerance Patient tolerated treatment well           Past Medical History:  Diagnosis Date  . ACHILLES TENDINITIS   . ALLERGIC RHINITIS   . Allergy   . Anginal pain ([place]) 04/04/2016   Pt currently feels like she is having muscle spasms and aching in her chest  . Anxiety   . Asthma   . Chronic kidney disease   . GERD   . History of kidney stones   . HYPERTENSION   . LATERAL EPICONDYLITIS, RIGHT   . LOW BACK PAIN   . Plantar fascial fibromatosis   . SUBACROMIAL BURSITIS, RIGHT   . Thoracic scoliosis 10/01/2015  . TIA (transient ischemic attack)     Past Surgical History:  Procedure Laterality Date  . ANTERIOR CERVICAL DECOMP/DISCECTOMY FUSION N/A 04/07/2016   Procedure: Cervical two-three Anterior cervical decompression/discectomy/fusion;  Surgeon: Ashok Pall, MD;  Location: [facility];  Service: Neurosurgery;  Laterality: N/A;  . BACK SURGERY  2015   lower back  . EXTRACORPOREAL SHOCK WAVE LITHOTRIPSY Left 09/28/2016   Procedure: LEFT EXTRACORPOREAL SHOCK WAVE LITHOTRIPSY (ESWL);  Surgeon: Festus Aloe, MD;  [facility];  Service: Urology;  Laterality: Left;  . EXTRACORPOREAL SHOCK WAVE LITHOTRIPSY Right 04/08/2018   Procedure: EXTRACORPOREAL SHOCK WAVE LITHOTRIPSY (ESWL);  Surgeon: Ardis Hughs, MD;  Location: [facility];  Service: Urology;  Laterality: Right;  . KIDNEY SURGERY    . LITHOTRIPSY  05/2016  . TUBAL  LIGATION      There were no vitals filed for this visit.   Subjective Assessment - 11/28/19 1109    Subjective "He (other neurolofgist) said it was stress." Pt reports "holding on to stuff so I won't fall."    Currently in Pain? No/denies                 ADULT SLP TREATMENT - 11/28/19 1112      General Information   Behavior/Cognition Alert;Cooperative;Pleasant mood      Treatment Provided   Treatment provided Cognitive-Linquistic      Cognitive-Linquistic Treatment   Treatment focused on Dysarthria;Patient/family/caregiver education    Skilled Treatment SLP and pt talked in mod complex conversation about her appointment with her neurologist for a second opinion, who also thought pt's s/sx were due to stress. SLP and pt also discussed some things she could do to relax (see pt instrcutions), think about scheduling a neurology consult at [facility], [facility], or [facility], talking to a counselor, and also that during this conversation pt's speech went in severity from neraly-WNL to min-mod hesitant speech (see pt instructions). Notably, when pt answered the phone her voice sounded WNL with "Hello?"       Assessment / Recommendations / [place] with current plan of care      Progression Toward Goals   Progression toward goals Progressing toward goals  SLP Education - 11/28/19 1305    Education Details see pt instructions    Person(s) Educated Patient    Methods Explanation;Handout    Comprehension Verbalized understanding            SLP Short Term Goals - 11/28/19 1307      SLP SHORT TERM GOAL #1   Title pt will perform HEP for articulatory precision with rare min A over 3 sessions    Time 2    Period Weeks    Status On-going      SLP SHORT TERM GOAL #2   Title pt will report less laborious speech than at eval by 25%    Time 2    Period Weeks    Status On-going      SLP SHORT TERM GOAL #3   Title pt will undergo further cognitive-linguistic  testing PRN    Time 2    Period Weeks   or session #5   Status Revised   time           SLP Long Term Goals - 11/28/19 1308      SLP LONG TERM GOAL #1   Title pt will perform HEP for speech articulatory precision with modified independence in 3 sessions    Time 6    Period Weeks   or 17 sessions - for all LTGs   Status On-going      SLP LONG TERM GOAL #2   Title pt will report greater speech QOL than at session #2, in one of her last 2 ST sessions    Time 6    Period Weeks    Status On-going            Plan - 11/28/19 1306    Clinical Impression Statement Pt presents with lt labial and lingual weakness, and lingual deviation to rt (slight). Pt speech rate varied today from very near-WNL to slower than normal. SLP encouraged a third opinion with neurologist if pt still does not think her s/sx are psychologically related. Pt cont to report decr'd short term memory since her episode in [place] in mid-July. SLP recommends continued skilled ST to make speech less laborious and more like speech at her PLOF. Pt's short term memory will be assessed PRN and goals may be added for this as well.    Speech Therapy Frequency 2x / week    Duration --   8 weeks or 17 total visits   Treatment/Interventions Oral motor exercises;Functional tasks;Compensatory techniques;SLP instruction and feedback;Cognitive reorganization;Patient/family education;Internal/external aids    Potential to Achieve Goals Fair    Potential Considerations Other (comment)   etiology unknown          Patient will benefit from skilled therapeutic intervention in order to improve the following deficits and impairments:   Dysarthria and anarthria  Cognitive communication deficit    Problem List Patient Active Problem List   Diagnosis Date Noted  . Left-sided weakness 10/31/2019  . Balance disorder 10/31/2019  . Difficulty with speech 10/02/2019  . Insomnia 10/02/2019  . TIA (transient ischemic attack)  09/15/2019  . HLD (hyperlipidemia) 04/14/2019  . Acute ear pain, bilateral 04/14/2019  . Vitamin D deficiency 04/14/2019  . Anxiety 10/06/2018  . Neck pain 09/10/2018  . Hand swelling 09/10/2018  . Bilateral radiating leg pain 04/23/2018  . Myalgia 04/23/2018  . Right renal stone 04/03/2018  . Smoker 04/29/2016  . Bilateral hand pain 04/29/2016  . HNP (herniated nucleus pulposus), cervical 04/07/2016  . Cervical  radiculopathy, acute 01/29/2016  . Acute upper respiratory infection 01/29/2016  . Degenerative disc disease, cervical 01/11/2016  . Hyperglycemia 11/17/2015  . Angioedema 10/12/2015  . Bilateral lower extremity edema 10/12/2015  . Mouth sores 10/12/2015  . Hypokalemia 10/12/2015  . Thoracic scoliosis 10/01/2015  . Peripheral edema 10/01/2015  . Eczema 10/01/2015  . Asthma 10/01/2015  . Breast lump on right side at 4 o'clock position 01/21/2014  . Hot flashes 10/01/2013  . Lumbar radiculopathy 07/17/2012  . Grief reaction 07/17/2012  . Family history of colon cancer 07/02/2012  . OAB (overactive bladder) 03/01/2012  . Encounter for well adult exam with abnormal findings 08/23/2010  . Plantar fasciitis 08/23/2010  . Dysuria 01/05/2010  . UTI (urinary tract infection) 10/19/2008  . ANKLE PAIN, BILATERAL 10/19/2008  . VAGINITIS 09/10/2008  . JOINT EFFUSION, ANKLE 09/10/2008  . SUBACROMIAL BURSITIS, RIGHT 09/01/2008  . ACHILLES TENDINITIS 09/01/2008  . LATERAL EPICONDYLITIS, RIGHT 08/18/2008  . Essential hypertension 07/12/2007  . Allergic rhinitis 07/12/2007  . GERD 07/12/2007  . Chronic low back pain 07/12/2007  . NEPHROLITHIASIS, HX OF 07/12/2007    [facility], CCC-SLP  11/28/2019, 1:09 PM  [facility] [address] [place], [place], [address] Phone: [phone]   Fax:  [phone]   Name: [name] Walker MRN: [id number] Date of Birth: September 12, 1960

## 2019-12-01 NOTE — Telephone Encounter (Signed)
Message left for patient today and info given.

## 2019-12-02 ENCOUNTER — Ambulatory Visit: Payer: BC Managed Care – PPO

## 2019-12-02 ENCOUNTER — Telehealth: Payer: Self-pay | Admitting: Internal Medicine

## 2019-12-02 NOTE — Telephone Encounter (Signed)
I would not feel comfortable with that, but I can refer her to counseling if she likes

## 2019-12-02 NOTE — Telephone Encounter (Signed)
   Patient requesting to increase dose of LORazepam (ATIVAN) 0.5 MG tablet, stating current dose not helping  Please advise

## 2019-12-02 NOTE — Telephone Encounter (Signed)
Sent to Dr. John to advise. 

## 2019-12-05 ENCOUNTER — Ambulatory Visit: Payer: BC Managed Care – PPO | Admitting: Physical Therapy

## 2019-12-05 NOTE — Telephone Encounter (Signed)
Patient has called back and informed of information below. And patient states she already has counseling.

## 2019-12-09 ENCOUNTER — Other Ambulatory Visit: Payer: Self-pay

## 2019-12-09 ENCOUNTER — Other Ambulatory Visit: Payer: Self-pay | Admitting: Family

## 2019-12-09 ENCOUNTER — Ambulatory Visit: Payer: BC Managed Care – PPO

## 2019-12-09 ENCOUNTER — Telehealth: Payer: Self-pay | Admitting: Internal Medicine

## 2019-12-09 ENCOUNTER — Telehealth (INDEPENDENT_AMBULATORY_CARE_PROVIDER_SITE_OTHER): Payer: BC Managed Care – PPO | Admitting: Family

## 2019-12-09 DIAGNOSIS — R109 Unspecified abdominal pain: Secondary | ICD-10-CM

## 2019-12-09 NOTE — Telephone Encounter (Signed)
Patient called and states that she is having pain on the left lower side of her stomach, it also hurts to get up and walk she said. She thinks she may have a kidney stone. Transferred to [employer] at  [employer].

## 2019-12-09 NOTE — Progress Notes (Signed)
[name] Walker is a 59 y.o. female with the following history as recorded in EpicCare:  Patient Active Problem List   Diagnosis Date Noted  . Left-sided weakness 10/31/2019  . Balance disorder 10/31/2019  . Difficulty with speech 10/02/2019  . Insomnia 10/02/2019  . TIA (transient ischemic attack) 09/15/2019  . HLD (hyperlipidemia) 04/14/2019  . Acute ear pain, bilateral 04/14/2019  . Vitamin D deficiency 04/14/2019  . Anxiety 10/06/2018  . Neck pain 09/10/2018  . Hand swelling 09/10/2018  . Bilateral radiating leg pain 04/23/2018  . Myalgia 04/23/2018  . Right renal stone 04/03/2018  . Smoker 04/29/2016  . Bilateral hand pain 04/29/2016  . HNP (herniated nucleus pulposus), cervical 04/07/2016  . Cervical radiculopathy, acute 01/29/2016  . Acute upper respiratory infection 01/29/2016  . Degenerative disc disease, cervical 01/11/2016  . Hyperglycemia 11/17/2015  . Angioedema 10/12/2015  . Bilateral lower extremity edema 10/12/2015  . Mouth sores 10/12/2015  . Hypokalemia 10/12/2015  . Thoracic scoliosis 10/01/2015  . Peripheral edema 10/01/2015  . Eczema 10/01/2015  . Asthma 10/01/2015  . Breast lump on right side at 4 o'clock position 01/21/2014  . Hot flashes 10/01/2013  . Lumbar radiculopathy 07/17/2012  . Grief reaction 07/17/2012  . Family history of colon cancer 07/02/2012  . OAB (overactive bladder) 03/01/2012  . Encounter for well adult exam with abnormal findings 08/23/2010  . Plantar fasciitis 08/23/2010  . Dysuria 01/05/2010  . UTI (urinary tract infection) 10/19/2008  . ANKLE PAIN, BILATERAL 10/19/2008  . VAGINITIS 09/10/2008  . JOINT EFFUSION, ANKLE 09/10/2008  . SUBACROMIAL BURSITIS, RIGHT 09/01/2008  . ACHILLES TENDINITIS 09/01/2008  . LATERAL EPICONDYLITIS, RIGHT 08/18/2008  . Essential hypertension 07/12/2007  . Allergic rhinitis 07/12/2007  . GERD 07/12/2007  . Chronic low back pain 07/12/2007  . NEPHROLITHIASIS, HX OF 07/12/2007    Current  Outpatient Medications  Medication Sig Dispense Refill  . acetaminophen (TYLENOL) 500 MG tablet Take 1,000 mg by mouth every 6 (six) hours as needed for mild pain.    Marland Kitchen albuterol (VENTOLIN HFA) 108 (90 Base) MCG/ACT inhaler Inhale 2 puffs into the lungs every 6 (six) hours as needed for wheezing or shortness of breath. 18 g 0  . amLODipine (NORVASC) 5 MG tablet Take 1 tablet (5 mg total) by mouth daily. 90 tablet 3  . aspirin EC 81 MG tablet Take 1 tablet (81 mg total) by mouth daily. Swallow whole. 30 tablet 11  . atorvastatin (LIPITOR) 10 MG tablet Take 1 tablet (10 mg total) by mouth daily. 90 tablet 3  . cephALEXin (KEFLEX) 500 MG capsule Take by mouth.    . cetirizine (ZYRTEC) 10 MG tablet Take 10 mg by mouth daily as needed for allergies.    . citalopram (CELEXA) 10 MG tablet Take 1 tablet (10 mg total) by mouth daily. 30 tablet 2  . HYDROcodone-acetaminophen (NORCO/VICODIN) 5-325 MG tablet Take 1 tablet by mouth every 6 (six) hours as needed for moderate pain. 60 tablet 0  . LORazepam (ATIVAN) 0.5 MG tablet Take 1 tablet (0.5 mg total) by mouth 2 (two) times daily as needed. for anxiety 60 tablet 2  . losartan (COZAAR) 50 MG tablet Take 50 mg by mouth daily.    . nicotine polacrilex (NICORETTE) 4 MG gum Take 1 each (4 mg total) by mouth as needed for smoking cessation. 100 tablet 5  . nicotine polacrilex (NICORETTE) 4 MG lozenge Take 1 lozenge (4 mg total) by mouth as needed for smoking cessation. 100 tablet 5  .  pantoprazole (PROTONIX) 40 MG tablet Take 1 tablet (40 mg total) by mouth daily. 90 tablet 3  . traZODone (DESYREL) 50 MG tablet Take 1 tablet (50 mg total) by mouth at bedtime as needed for sleep. 15 tablet 0   No current facility-administered medications for this visit.    Allergies: Flexeril [cyclobenzaprine], Lisinopril, Other, Robaxin [methocarbamol], Contrast media [iodinated diagnostic agents], Gabapentin, and Lyrica [pregabalin]  Past Medical History:  Diagnosis Date  .  ACHILLES TENDINITIS   . ALLERGIC RHINITIS   . Allergy   . Anginal pain ([place]) 04/04/2016   Pt currently feels like she is having muscle spasms and aching in her chest  . Anxiety   . Asthma   . Chronic kidney disease   . GERD   . History of kidney stones   . HYPERTENSION   . LATERAL EPICONDYLITIS, RIGHT   . LOW BACK PAIN   . Plantar fascial fibromatosis   . SUBACROMIAL BURSITIS, RIGHT   . Thoracic scoliosis 10/01/2015  . TIA (transient ischemic attack)     Past Surgical History:  Procedure Laterality Date  . ANTERIOR CERVICAL DECOMP/DISCECTOMY FUSION N/A 04/07/2016   Procedure: Cervical two-three Anterior cervical decompression/discectomy/fusion;  Surgeon: Ashok Pall, MD;  Location: [facility];  Service: Neurosurgery;  Laterality: N/A;  . BACK SURGERY  2015   lower back  . EXTRACORPOREAL SHOCK WAVE LITHOTRIPSY Left 09/28/2016   Procedure: LEFT EXTRACORPOREAL SHOCK WAVE LITHOTRIPSY (ESWL);  Surgeon: Festus Aloe, MD;  Location: [facility];  Service: Urology;  Laterality: Left;  . EXTRACORPOREAL SHOCK WAVE LITHOTRIPSY Right 04/08/2018   Procedure: EXTRACORPOREAL SHOCK WAVE LITHOTRIPSY (ESWL);  Surgeon: Ardis Hughs, MD;  Location: [facility];  Service: Urology;  Laterality: Right;  . KIDNEY SURGERY    . LITHOTRIPSY  05/2016  . TUBAL LIGATION      Family History  Problem Relation Age of Onset  . Cancer Sister        colon cancer  . Colon cancer Sister   . Diabetes Mother   . Cancer Father        Prostate  . Colon cancer Father   . Breast cancer Maternal Aunt        50's  . Esophageal cancer Neg Hx   . Stomach cancer Neg Hx     Social History   Tobacco Use  . Smoking status: Current Every Day Smoker    Packs/day: 0.50    Years: 20.00    Pack years: 10.00    Types: Cigarettes  . Smokeless tobacco: Never Used  Substance Use Topics  . Alcohol use: No    Subjective:    I connected with [name] Walker on 12/09/19 at  9:00 AM EDT by a video enabled telemedicine  application and verified that I am speaking with the correct person using two identifiers.   I discussed the limitations of evaluation and management by telemedicine and the availability of in person appointments. The patient expressed understanding and agreed to proceed. Provider in office/ patient is at home; provider and patient are only 2 people on video call.    Complaining of left sided flank/ abdominal pain x 1 day; notes she is prone to recurrent kidney stones and states this feels like a kidney stone; denies any fever or changes in bowel movements- no diarrhea or vomiting; no blood in stool; unable to take contrast for CTs due to allergy; does not drive- limited transportation options today;      Objective:  There were no vitals  filed for this visit.  General: Well developed, well nourished, in no acute distress  Head: Normocephalic and atraumatic  Lungs: Respirations unlabored;  Neurologic: Alert and oriented; speech intact; face symmetrical;   Assessment:  1. Acute left flank pain     Plan:  Concern for renal stone; STAT renal stone CT ordered; follow-up to be determined; if no stone is notes, will plan to go ahead and treat for diverticulitis due to location of pain;   No follow-ups on file.  No orders of the defined types were placed in this encounter.   Requested Prescriptions    No prescriptions requested or ordered in this encounter

## 2019-12-10 ENCOUNTER — Ambulatory Visit
Admission: RE | Admit: 2019-12-10 | Discharge: 2019-12-10 | Disposition: A | Discharge status: Home / Self Care | Payer: BC Managed Care – PPO | Source: Ambulatory Visit | Attending: Family | Admitting: Family

## 2019-12-10 DIAGNOSIS — R109 Unspecified abdominal pain: Secondary | ICD-10-CM

## 2019-12-11 ENCOUNTER — Telehealth: Payer: Self-pay | Admitting: Internal Medicine

## 2019-12-11 DIAGNOSIS — R109 Unspecified abdominal pain: Secondary | ICD-10-CM

## 2019-12-11 NOTE — Telephone Encounter (Signed)
We should at least do urine testing - I will order

## 2019-12-11 NOTE — Telephone Encounter (Signed)
[facility], [address] Phone:  [phone]  Fax:  [phone]     Patient seen by Jodi Mourning on 12/09/2019 for possible kidney stone and was ordered a CT scan during visit. Patient went to get the scan yesterday and waited an hour and then left because she had another appointment. Patient now saying she thinks she has a UTI and was requesting if she could get an antibiotic for a UTI.

## 2019-12-12 ENCOUNTER — Ambulatory Visit: Payer: BC Managed Care – PPO

## 2019-12-12 ENCOUNTER — Other Ambulatory Visit: Payer: Self-pay

## 2019-12-12 DIAGNOSIS — R471 Dysarthria and anarthria: Secondary | ICD-10-CM | POA: Diagnosis not present

## 2019-12-12 DIAGNOSIS — R482 Apraxia: Secondary | ICD-10-CM

## 2019-12-12 NOTE — Telephone Encounter (Signed)
I was able to [facility] for pt informing her that Dr. Jenny Reichmann has ordered a lab for a urine test to be done.

## 2019-12-12 NOTE — Therapy (Signed)
[facility] [address] [place], [place], [address] Phone: [phone]   Fax:  [phone]  Speech Language Pathology Treatment/Discharge summary  Patient Details  Name: [name] Walker MRN: [id number] Date of Birth: 01/23/1961 Referring Provider (SLP): Cathlean Cower, MD   Encounter Date: 12/12/2019   End of Session - 12/12/19 1244    Visit Number 5    Number of Visits 17    Date for SLP Re-Evaluation 02/06/20    SLP Start Time 1150    SLP Stop Time  1232    SLP Time Calculation (min) 42 min    Activity Tolerance Patient tolerated treatment well           Past Medical History:  Diagnosis Date  . ACHILLES TENDINITIS   . ALLERGIC RHINITIS   . Allergy   . Anginal pain ([place]) 04/04/2016   Pt currently feels like she is having muscle spasms and aching in her chest  . Anxiety   . Asthma   . Chronic kidney disease   . GERD   . History of kidney stones   . HYPERTENSION   . LATERAL EPICONDYLITIS, RIGHT   . LOW BACK PAIN   . Plantar fascial fibromatosis   . SUBACROMIAL BURSITIS, RIGHT   . Thoracic scoliosis 10/01/2015  . TIA (transient ischemic attack)     Past Surgical History:  Procedure Laterality Date  . ANTERIOR CERVICAL DECOMP/DISCECTOMY FUSION N/A 04/07/2016   Procedure: Cervical two-three Anterior cervical decompression/discectomy/fusion;  Surgeon: Ashok Pall, MD;  Location: [facility];  Service: Neurosurgery;  Laterality: N/A;  . BACK SURGERY  2015   lower back  . EXTRACORPOREAL SHOCK WAVE LITHOTRIPSY Left 09/28/2016   Procedure: LEFT EXTRACORPOREAL SHOCK WAVE LITHOTRIPSY (ESWL);  Surgeon: Festus Aloe, MD;  [facility];  Service: Urology;  Laterality: Left;  . EXTRACORPOREAL SHOCK WAVE LITHOTRIPSY Right 04/08/2018   Procedure: EXTRACORPOREAL SHOCK WAVE LITHOTRIPSY (ESWL);  Surgeon: Ardis Hughs, MD;  Location[facility];  Service: Urology;  Laterality: Right;  . KIDNEY SURGERY    . LITHOTRIPSY   05/2016  . TUBAL LIGATION        There were no vitals filed for this visit.   Subjective Assessment - 12/12/19 1114    Subjective "I  had to go do an - MRI on my brain the other day."    Currently in Pain? No/denies                 ADULT SLP TREATMENT - 12/12/19 1115      General Information   Behavior/Cognition Alert;Cooperative;Pleasant mood      Treatment Provided   Treatment provided Cognitive-Linquistic      Cognitive-Linquistic Treatment   Treatment focused on Dysarthria;Patient/family/caregiver education    Skilled Treatment "I've been having - a- little anxiety, but I've been doing pretty good." SLP and pt talked for 30 minutes about simple to mod complex topics including pt's opportunity to talk with a therapist/counselor. Pt stated she left a message but has not had a return call. SLP encouraged pt to call back - pt states she thinks she would benefit from this. Pt's speech rate is WNL 90% of the time today, with WNL prosody 85-90% of the time today. Pt states she has more difficulty picturing where streets are since ED visit in [place]. Pt does not report other difficulties with visuospatial abilites. Pt agreed with d/c today.       Assessment / Recommendations / Plan   Plan Continue  with current plan of care      Progression Toward Goals   Progression toward goals Progressing toward goals              SLP Short Term Goals - 12/12/19 1237      SLP SHORT TERM GOAL #1   Title pt will perform HEP for articulatory precision with rare min A over 3 sessions    Time 2    Period Weeks    Status Deferred   pt not performing HEP regularly     SLP SHORT TERM GOAL #2   Title pt will report less laborious speech than at eval by 25%    Status Achieved      SLP SHORT TERM GOAL #3   Title pt will undergo further cognitive-linguistic testing PRN    Period --   or session #5   Status Deferred   time           SLP Long Term Goals - 12/12/19 1237      SLP  LONG TERM GOAL #1   Title pt will perform HEP for speech articulatory precision with modified independence in 3 sessions    Period --   or 17 sessions - for all LTGs   Status Deferred      SLP LONG TERM GOAL #2   Title pt will report greater speech QOL than at session #2, in one of her last 2 ST sessions    Status Partially Met            Plan - 12/12/19 1238    Clinical Impression Statement Pt presents with lt labial and lingual WNL/WFL. See "skilled intervention" for more details. Pt showed concern over increasing amounts of her copays and was agreeable to d/c today, stating she was "doing so much better." SLP agreed and encouraged pt to cont to attempt contact with a counselor/therapist as SLP told Loneta that SLP thinks her speech difficulty is primarily due to anxiety/stress. At this time skilled ST will d/c - pt agrees.    Treatment/Interventions Oral motor exercises;Functional tasks;Compensatory techniques;SLP instruction and feedback;Cognitive reorganization;Patient/family education;Internal/external aids    Potential to Achieve Goals Fair    Potential Considerations Other (comment)   etiology unknown          Patient will benefit from skilled therapeutic intervention in order to improve the following deficits and impairments:   Dysarthria and anarthria  Verbal apraxia   SPEECH THERAPY DISCHARGE SUMMARY  Visits from Start of Care: 5  Current functional level related to goals / functional outcomes: Goals for speech were met/partially met. At d/c pt speech rate had incr'd significantly and her prosody was WFL/WNL. SLP strongly suspects pt's speech difficulty is primarily based in psychological factors. Note that occasionally pt's articulation and prosody are WNL. Pt reports mild memory deficits but is compensating appropriately.   Remaining deficits: Mild intermittent memory differences.   Education / Equipment: Compensations for deficit areas.   Plan: Patient agrees  to discharge.  Patient goals were not met. Patient is being discharged due to being pleased with the current functional level.  ?????       Problem List Patient Active Problem List   Diagnosis Date Noted  . Left-sided weakness 10/31/2019  . Balance disorder 10/31/2019  . Difficulty with speech 10/02/2019  . Insomnia 10/02/2019  . TIA (transient ischemic attack) 09/15/2019  . HLD (hyperlipidemia) 04/14/2019  . Acute ear pain, bilateral 04/14/2019  . Vitamin D deficiency 04/14/2019  . Anxiety  10/06/2018  . Neck pain 09/10/2018  . Hand swelling 09/10/2018  . Bilateral radiating leg pain 04/23/2018  . Myalgia 04/23/2018  . Right renal stone 04/03/2018  . Smoker 04/29/2016  . Bilateral hand pain 04/29/2016  . HNP (herniated nucleus pulposus), cervical 04/07/2016  . Cervical radiculopathy, acute 01/29/2016  . Acute upper respiratory infection 01/29/2016  . Degenerative disc disease, cervical 01/11/2016  . Hyperglycemia 11/17/2015  . Angioedema 10/12/2015  . Bilateral lower extremity edema 10/12/2015  . Mouth sores 10/12/2015  . Hypokalemia 10/12/2015  . Thoracic scoliosis 10/01/2015  . Peripheral edema 10/01/2015  . Eczema 10/01/2015  . Asthma 10/01/2015  . Breast lump on right side at 4 o'clock position 01/21/2014  . Hot flashes 10/01/2013  . Lumbar radiculopathy 07/17/2012  . Grief reaction 07/17/2012  . Family history of colon cancer 07/02/2012  . OAB (overactive bladder) 03/01/2012  . Encounter for well adult exam with abnormal findings 08/23/2010  . Plantar fasciitis 08/23/2010  . Dysuria 01/05/2010  . UTI (urinary tract infection) 10/19/2008  . ANKLE PAIN, BILATERAL 10/19/2008  . VAGINITIS 09/10/2008  . JOINT EFFUSION, ANKLE 09/10/2008  . SUBACROMIAL BURSITIS, RIGHT 09/01/2008  . ACHILLES TENDINITIS 09/01/2008  . LATERAL EPICONDYLITIS, RIGHT 08/18/2008  . Essential hypertension 07/12/2007  . Allergic rhinitis 07/12/2007  . GERD 07/12/2007  . Chronic low back  pain 07/12/2007  . NEPHROLITHIASIS, HX OF 07/12/2007    [facility] ,MS, CCC-SLP  12/12/2019, 12:45 PM  [facility] [address] [place], [place], [address] Phone: [phone]   Fax:  [phone]   Name: [name] Walker MRN: [id number] Date of Birth: [birth year]

## 2019-12-15 ENCOUNTER — Encounter: Payer: Self-pay | Admitting: Internal Medicine

## 2019-12-15 ENCOUNTER — Other Ambulatory Visit (INDEPENDENT_AMBULATORY_CARE_PROVIDER_SITE_OTHER): Payer: BC Managed Care – PPO

## 2019-12-15 ENCOUNTER — Ambulatory Visit: Payer: BC Managed Care – PPO

## 2019-12-15 DIAGNOSIS — R109 Unspecified abdominal pain: Secondary | ICD-10-CM | POA: Diagnosis not present

## 2019-12-15 LAB — URINALYSIS, ROUTINE W REFLEX MICROSCOPIC
Bilirubin Urine: NEGATIVE
Hgb urine dipstick: NEGATIVE
Ketones, ur: NEGATIVE
Leukocytes,Ua: NEGATIVE
Nitrite: NEGATIVE
RBC / HPF: NONE SEEN (ref 0–?)
Specific Gravity, Urine: 1.025 (ref 1.000–1.030)
Total Protein, Urine: NEGATIVE
Urine Glucose: NEGATIVE
Urobilinogen, UA: 0.2 (ref 0.0–1.0)
pH: 6 (ref 5.0–8.0)

## 2019-12-16 ENCOUNTER — Ambulatory Visit: Payer: BC Managed Care – PPO | Admitting: Obstetrics and Gynecology

## 2019-12-16 ENCOUNTER — Encounter: Payer: Self-pay | Admitting: Obstetrics and Gynecology

## 2019-12-16 ENCOUNTER — Other Ambulatory Visit: Payer: Self-pay

## 2019-12-16 ENCOUNTER — Encounter: Payer: Self-pay | Admitting: Internal Medicine

## 2019-12-16 VITALS — BP 120/74

## 2019-12-16 DIAGNOSIS — R9389 Abnormal findings on diagnostic imaging of other specified body structures: Secondary | ICD-10-CM | POA: Diagnosis not present

## 2019-12-16 DIAGNOSIS — R102 Pelvic and perineal pain: Secondary | ICD-10-CM

## 2019-12-16 DIAGNOSIS — N859 Noninflammatory disorder of uterus, unspecified: Secondary | ICD-10-CM

## 2019-12-16 LAB — URINE CULTURE
MICRO NUMBER:: 11083838
Result:: NO GROWTH
SPECIMEN QUALITY:: ADEQUATE

## 2019-12-16 NOTE — Progress Notes (Signed)
BLAKELEE ALLINGTON 01/01/1961 [id number]  SUBJECTIVE:  59 y.o. G3P3 female presents for consideration of draining endometrial fluid that was present on a pelvic ultrasound performed 04/2018 for work-up of abdominal pain at the time.  Endometrial lining thickness was 3 mm at that time.  <2 mm echogenic focus at the endometrium was noted.  She does get occasional lower abdominal pains and cramping.  No vaginal bleeding or discharge.  No dysuria symptoms.  History of kidney stones in the past.  Current Outpatient Medications  Medication Sig Dispense Refill  . acetaminophen (TYLENOL) 500 MG tablet Take 1,000 mg by mouth every 6 (six) hours as needed for mild pain.    Marland Kitchen albuterol (VENTOLIN HFA) 108 (90 Base) MCG/ACT inhaler Inhale 2 puffs into the lungs every 6 (six) hours as needed for wheezing or shortness of breath. 18 g 0  . amLODipine (NORVASC) 5 MG tablet Take 1 tablet (5 mg total) by mouth daily. 90 tablet 3  . aspirin EC 81 MG tablet Take 1 tablet (81 mg total) by mouth daily. Swallow whole. 30 tablet 11  . atorvastatin (LIPITOR) 10 MG tablet Take 1 tablet (10 mg total) by mouth daily. 90 tablet 3  . cetirizine (ZYRTEC) 10 MG tablet Take 10 mg by mouth daily as needed for allergies.    . citalopram (CELEXA) 10 MG tablet Take 1 tablet (10 mg total) by mouth daily. 30 tablet 2  . HYDROcodone-acetaminophen (NORCO/VICODIN) 5-325 MG tablet Take 1 tablet by mouth every 6 (six) hours as needed for moderate pain. 60 tablet 0  . LORazepam (ATIVAN) 0.5 MG tablet Take 1 tablet (0.5 mg total) by mouth 2 (two) times daily as needed. for anxiety 60 tablet 2  . losartan (COZAAR) 50 MG tablet Take 50 mg by mouth daily.    . nicotine polacrilex (NICORETTE) 4 MG gum Take 1 each (4 mg total) by mouth as needed for smoking cessation. 100 tablet 5  . nicotine polacrilex (NICORETTE) 4 MG lozenge Take 1 lozenge (4 mg total) by mouth as needed for smoking cessation. 100 tablet 5  . pantoprazole (PROTONIX) 40 MG tablet  Take 1 tablet (40 mg total) by mouth daily. 90 tablet 3  . cephALEXin (KEFLEX) 500 MG capsule Take by mouth. (Patient not taking: Reported on 12/16/2019)    . traZODone (DESYREL) 50 MG tablet Take 1 tablet (50 mg total) by mouth at bedtime as needed for sleep. (Patient not taking: Reported on 12/16/2019) 15 tablet 0   No current facility-administered medications for this visit.   Allergies: Flexeril [cyclobenzaprine], Lisinopril, Other, Robaxin [methocarbamol], Contrast media [iodinated diagnostic agents], Gabapentin, and Lyrica [pregabalin]  Patient's last menstrual period was 08/22/2010.  Past medical history,surgical history, problem list, medications, allergies, family history and social history were all reviewed and documented as reviewed in the EPIC chart.  ROS: Pertinent positives and negatives as reviewed in HPI    OBJECTIVE:  BP 120/74   LMP 08/22/2010  The patient appears well, alert, oriented x 3, in no distress.  PELVIC EXAM: VULVA: normal appearing vulva with atrophic change, no masses, tenderness or lesions, VAGINA: normal appearing vagina with atrophic change, normal color and discharge, no lesions, CERVIX: normal appearing cervix without discharge or lesions, UTERUS: uterus is normal size, shape, consistency and nontender, ADNEXA: Normal, no masses, nontender  Procedure The cervix is viewed with the speculum and grasped with an Allis clamp on the anterior lip of the cervix.  Betadine cervical cleanse was performed.  Endometrial Pipelle is  carefully guided through the endocervical canal to reach the endometrial cavity.  Sound length of 6.5 cm obtained.  The plunger is withdrawn and yellow serous fluid contents is obtained, filling about half of the Pipelle.  This is evacuated into the specimen cup containing sterility, and the Pipelle was then reinserted into the endometrial cavity and suction is repeated, extracting a little bit more serous fluid.  No tissue was obtained in the  specimen so it was discarded and not sent for analysis.  Hemostasis was noted after procedure without bleeding.  The patient tolerated the procedure well.  Chaperone: Caryn Bee present during the examination  ASSESSMENT:  59 y.o. G3P3 here for evaluation of lower abdominal/pelvic pain and endometrial fluid present on ultrasound last year  PLAN:  Prior to proceeding with the procedure today we discussed the potential limited utility of draining the endometrial fluid.  I offered to first repeat the ultrasound to see if fluid is still there.  We discussed that endometrial fluid is a normal finding in the absence of endometrial thickening.  We discussed that there can be other causes for lower abdominal pain and cramping and physiologic fluid in the uterus is probably not the cause of that.  She has not had any vaginal bleeding and her ultrasound at the time last year indicated normal postmenopausal endometrial thickness measurement.  She wanted to proceed with the cervical dilation and drainage of the endometrial fluid, so we did so today.  No tissue was obtained during the endometrial portion of the procedure so there is no pathology specimen from that.  We will also check urinalysis.  I recommend she follow-up with her primary doctor if her lower abdominal pain is not improving.   Joseph Pierini MD 12/16/19

## 2019-12-17 ENCOUNTER — Ambulatory Visit: Payer: BC Managed Care – PPO

## 2019-12-18 LAB — URINALYSIS, COMPLETE W/RFL CULTURE
Bilirubin Urine: NEGATIVE
Glucose, UA: NEGATIVE
Hgb urine dipstick: NEGATIVE
Hyaline Cast: NONE SEEN /LPF
Leukocyte Esterase: NEGATIVE
Nitrites, Initial: NEGATIVE
Protein, ur: NEGATIVE
RBC / HPF: NONE SEEN /HPF (ref 0–2)
Specific Gravity, Urine: 1.025 (ref 1.001–1.03)
pH: 6 (ref 5.0–8.0)

## 2019-12-18 LAB — URINE CULTURE
MICRO NUMBER:: 11089929
Result:: NO GROWTH
SPECIMEN QUALITY:: ADEQUATE

## 2019-12-18 LAB — CULTURE INDICATED

## 2019-12-22 ENCOUNTER — Ambulatory Visit
Admission: RE | Admit: 2019-12-22 | Discharge: 2019-12-22 | Disposition: A | Discharge status: Home / Self Care | Payer: BC Managed Care – PPO | Source: Ambulatory Visit | Attending: Family | Admitting: Family

## 2019-12-22 ENCOUNTER — Other Ambulatory Visit: Payer: Self-pay

## 2019-12-22 DIAGNOSIS — R109 Unspecified abdominal pain: Secondary | ICD-10-CM

## 2019-12-26 ENCOUNTER — Encounter: Payer: BC Managed Care – PPO | Admitting: Speech Pathology

## 2020-01-01 ENCOUNTER — Ambulatory Visit: Payer: BC Managed Care – PPO | Admitting: Neurology

## 2020-01-01 ENCOUNTER — Encounter: Payer: Self-pay | Admitting: Internal Medicine

## 2020-01-01 DIAGNOSIS — M5416 Radiculopathy, lumbar region: Secondary | ICD-10-CM

## 2020-01-02 MED ORDER — ONDANSETRON HCL 8 MG PO TABS: 8.0000 mg | ORAL_TABLET | Freq: Three times a day (TID) | ORAL | 0 refills | Status: DC | PRN

## 2020-01-02 NOTE — Telephone Encounter (Signed)
   Patient calling to request medication for nausea. She states she is drinking so much water that she feels nauseous.  Please advise

## 2020-01-02 NOTE — Telephone Encounter (Signed)
1) We will do the pain management referral as she is asking;  2) She may want to try not drinking so much water if it is causing her to be nauseous. We will call in the medication for the nausea.

## 2020-01-07 ENCOUNTER — Other Ambulatory Visit: Payer: Self-pay | Admitting: Internal Medicine

## 2020-01-07 ENCOUNTER — Ambulatory Visit: Payer: BC Managed Care – PPO

## 2020-01-07 DIAGNOSIS — Z1231 Encounter for screening mammogram for malignant neoplasm of breast: Secondary | ICD-10-CM

## 2020-01-08 ENCOUNTER — Encounter: Payer: Self-pay | Admitting: Physical Medicine & Rehabilitation

## 2020-01-12 ENCOUNTER — Telehealth: Payer: Self-pay | Admitting: Internal Medicine

## 2020-01-12 MED ORDER — LORAZEPAM 0.5 MG PO TABS
0.5000 mg | ORAL_TABLET | Freq: Two times a day (BID) | ORAL | 2 refills | Status: DC | PRN
Start: 2020-01-12 — End: 2020-04-04

## 2020-01-12 NOTE — Telephone Encounter (Signed)
Dr Leonie Man started celexa for her in august (not me)  Ok for ativan refill - done erx

## 2020-01-12 NOTE — Telephone Encounter (Signed)
   Patient states the new medication Dr. Jenny Reichmann put her on for anxiety she does not like the way it makes her feel and was wondering if she could be put back on her Ativan. Patient unsure of what the new medication is called. Patient # [phone]

## 2020-01-12 NOTE — Telephone Encounter (Signed)
Sent to Dr. John to advise. 

## 2020-01-29 ENCOUNTER — Telehealth: Payer: Self-pay | Admitting: Internal Medicine

## 2020-01-29 ENCOUNTER — Other Ambulatory Visit: Payer: Self-pay

## 2020-01-29 ENCOUNTER — Ambulatory Visit (INDEPENDENT_AMBULATORY_CARE_PROVIDER_SITE_OTHER): Payer: BC Managed Care – PPO | Admitting: Internal Medicine

## 2020-01-29 ENCOUNTER — Encounter: Payer: Self-pay | Admitting: Internal Medicine

## 2020-01-29 VITALS — BP 150/100 | HR 81 | Temp 98.1°F | Ht 63.0 in | Wt 157.0 lb

## 2020-01-29 DIAGNOSIS — R319 Hematuria, unspecified: Secondary | ICD-10-CM | POA: Insufficient documentation

## 2020-01-29 DIAGNOSIS — M5432 Sciatica, left side: Secondary | ICD-10-CM

## 2020-01-29 DIAGNOSIS — I10 Essential (primary) hypertension: Secondary | ICD-10-CM

## 2020-01-29 DIAGNOSIS — N200 Calculus of kidney: Secondary | ICD-10-CM

## 2020-01-29 DIAGNOSIS — N3001 Acute cystitis with hematuria: Secondary | ICD-10-CM

## 2020-01-29 DIAGNOSIS — F419 Anxiety disorder, unspecified: Secondary | ICD-10-CM

## 2020-01-29 DIAGNOSIS — F320 Major depressive disorder, single episode, mild: Secondary | ICD-10-CM

## 2020-01-29 DIAGNOSIS — M5416 Radiculopathy, lumbar region: Secondary | ICD-10-CM

## 2020-01-29 DIAGNOSIS — R310 Gross hematuria: Secondary | ICD-10-CM

## 2020-01-29 DIAGNOSIS — F32A Depression, unspecified: Secondary | ICD-10-CM | POA: Insufficient documentation

## 2020-01-29 DIAGNOSIS — R739 Hyperglycemia, unspecified: Secondary | ICD-10-CM

## 2020-01-29 MED ORDER — VENLAFAXINE HCL ER 150 MG PO CP24: 150.0000 mg | ORAL_CAPSULE | Freq: Every day | ORAL | 3 refills | Status: DC

## 2020-01-29 NOTE — Telephone Encounter (Signed)
[facility] calling about the venlafaxine XR (EFFEXOR XR) 150 MG 24 hr capsule. States yesterday another provider prescribed her DULoxetine (CYMBALTA) 30 MG capsule so since they are in the same class she is wondering if both are going to be taken or if there was a change in medications.  [phone]

## 2020-01-29 NOTE — Progress Notes (Signed)
Subjective:    Patient ID: [name] Walker, female    DOB: 1961/02/13, 59 y.o.   MRN: [id number]  HPI   Here to f/u; overall doing ok,  Pt denies chest pain, increasing sob or doe, wheezing, orthopnea, PND, increased LE swelling, palpitations, dizziness or syncope.  Pt denies new neurological symptoms such as new headache, or facial or extremity weakness or numbness.  Pt denies polydipsia, polyuria, or low sugar episode.  Pt states overall good compliance with meds, mostly trying to follow appropriate diet. Also Wakes up crying most mornings the last few wks, neuro told her due to stress and depression.  Also Saw ortho murphy wainer with finding of worsening lumbar disc disease and left sciatic like pain, and wanted to have her see surgury as the ESI did not help x 3, only lasted about 3 days.  Started on cymbalta 30 mg but she doesnot like the effects "I just feel sick all day with my stomach".  Asks to change to effexor.  Also working on a form for LT disability, needs to have mention on the form that her back issue is permanent. Also had episode gross hematuria and wondering if she passed a renal stone.  Denies urinary symptoms such as dysuria, frequency, urgency, flank pain, or n/v, fever, chills.   Past Medical History:  Diagnosis Date  . ACHILLES TENDINITIS   . ALLERGIC RHINITIS   . Allergy   . Anginal pain ([place]) 04/04/2016   Pt currently feels like she is having muscle spasms and aching in her chest  . Anxiety   . Asthma   . Chronic kidney disease   . GERD   . History of kidney stones   . HYPERTENSION   . LATERAL EPICONDYLITIS, RIGHT   . LOW BACK PAIN   . Plantar fascial fibromatosis   . SUBACROMIAL BURSITIS, RIGHT   . Thoracic scoliosis 10/01/2015  . TIA (transient ischemic attack)    Past Surgical History:  Procedure Laterality Date  . ANTERIOR CERVICAL DECOMP/DISCECTOMY FUSION N/A 04/07/2016   Procedure: Cervical two-three Anterior cervical decompression/discectomy/fusion;   Surgeon: Ashok Pall, MD;  Location: [facility];  Service: Neurosurgery;  Laterality: N/A;  . BACK SURGERY  2015   lower back  . EXTRACORPOREAL SHOCK WAVE LITHOTRIPSY Left 09/28/2016   Procedure: LEFT EXTRACORPOREAL SHOCK WAVE LITHOTRIPSY (ESWL);  Surgeon: Festus Aloe, MD;  Location: [facility];  Service: Urology;  Laterality: Left;  . EXTRACORPOREAL SHOCK WAVE LITHOTRIPSY Right 04/08/2018   Procedure: EXTRACORPOREAL SHOCK WAVE LITHOTRIPSY (ESWL);  Surgeon: Ardis Hughs, MD;  Location: [facility];  Service: Urology;  Laterality: Right;  . KIDNEY SURGERY    . LITHOTRIPSY  05/2016  . TUBAL LIGATION      reports that she has been smoking cigarettes. She has a 10.00 pack-year smoking history. She has never used smokeless tobacco. She reports that she does not drink alcohol and does not use drugs. family history includes Breast cancer in her maternal aunt; Cancer in her father and sister; Colon cancer in her father and sister; Diabetes in her mother. Allergies  Allergen Reactions  . Flexeril [Cyclobenzaprine] Swelling    Tongue swells  . Lisinopril Other (See Comments)    ? Possible tongue swelling   . Other Palpitations    ALL MUSCLE RELAXERS-PER PATIENT  . Robaxin [Methocarbamol] Other (See Comments)    Tongue swelling  . Contrast Media [Iodinated Diagnostic Agents] Itching  . Gabapentin Other (See Comments)  . Lyrica [Pregabalin]  Anxiety attacks   Current Outpatient Medications on File Prior to Visit  Medication Sig Dispense Refill  . acetaminophen (TYLENOL) 500 MG tablet Take 1,000 mg by mouth every 6 (six) hours as needed for mild pain.    Marland Kitchen albuterol (VENTOLIN HFA) 108 (90 Base) MCG/ACT inhaler Inhale 2 puffs into the lungs every 6 (six) hours as needed for wheezing or shortness of breath. 18 g 0  . amLODipine (NORVASC) 5 MG tablet Take 1 tablet (5 mg total) by mouth daily. 90 tablet 3  . aspirin EC 81 MG tablet Take 1 tablet (81 mg total) by mouth daily. Swallow whole. 30 tablet  11  . atorvastatin (LIPITOR) 10 MG tablet Take 1 tablet (10 mg total) by mouth daily. 90 tablet 3  . cetirizine (ZYRTEC) 10 MG tablet Take 10 mg by mouth daily as needed for allergies.    . DULoxetine (CYMBALTA) 30 MG capsule Take by mouth.    Marland Kitchen HYDROcodone-acetaminophen (NORCO/VICODIN) 5-325 MG tablet Take 1 tablet by mouth every 6 (six) hours as needed for moderate pain. 60 tablet 0  . lisinopril (ZESTRIL) 10 MG tablet Take by mouth.    Marland Kitchen LORazepam (ATIVAN) 0.5 MG tablet Take 1 tablet (0.5 mg total) by mouth 2 (two) times daily as needed. for anxiety 60 tablet 2  . nicotine (NICOTROL) 10 MG inhaler Puff 1 cartridge every 1-2 hours up to 6-16 cartridges/day.    . nicotine polacrilex (NICORETTE) 4 MG gum Take 1 each (4 mg total) by mouth as needed for smoking cessation. 100 tablet 5  . ondansetron (ZOFRAN) 8 MG tablet Take 1 tablet (8 mg total) by mouth every 8 (eight) hours as needed for nausea or vomiting. 20 tablet 0  . pantoprazole (PROTONIX) 40 MG tablet Take 1 tablet (40 mg total) by mouth daily. 90 tablet 3  . traZODone (DESYREL) 50 MG tablet Take 1 tablet (50 mg total) by mouth at bedtime as needed for sleep. 15 tablet 0   No current facility-administered medications on file prior to visit.   Review of Systems All otherwise neg per pt    Objective:   Physical Exam BP (!) 150/100 (BP Location: Left Arm, Patient Position: Sitting, Cuff Size: Large)   Pulse 81   Temp 98.1 F (36.7 C) (Oral)   Ht 5\' 3"  (1.6 m)   Wt 157 lb (71.2 kg)   LMP 08/22/2010   SpO2 97%   BMI 27.81 kg/m  VS noted,  Constitutional: Pt appears in NAD HENT: Head: NCAT.  Right Ear: External ear normal.  Left Ear: External ear normal.  Eyes: . Pupils are equal, round, and reactive to light. Conjunctivae and EOM are normal Nose: without d/c or deformity Neck: Neck supple. Gross normal ROM Cardiovascular: Normal rate and regular rhythm.   Pulmonary/Chest: Effort normal and breath sounds without rales or  wheezing.  Abd:  Soft, NT, ND, + BS, no organomegaly Neurological: Pt is alert. At baseline orientation, motor grossly intact Skin: Skin is warm. No rashes, other new lesions, no LE edema Psychiatric: Pt behavior is normal without agitation  All otherwise neg per pt Lab Results  Component Value Date   WBC 12.6 (H) 09/30/2019   HGB 15.6 (H) 09/30/2019   HCT 46.0 09/30/2019   PLT 212 09/30/2019   GLUCOSE 99 09/30/2019   CHOL 195 10/03/2018   TRIG 200.0 (H) 10/03/2018   HDL 39.50 10/03/2018   LDLDIRECT 117.0 09/06/2017   LDLCALC 116 (H) 10/03/2018   ALT 33 09/30/2019  AST 20 09/30/2019   NA 145 09/30/2019   K 3.3 (L) 09/30/2019   CL 104 09/30/2019   CREATININE 0.50 09/30/2019   BUN 12 09/30/2019   CO2 27 09/30/2019   TSH 0.47 09/23/2019   INR 0.9 09/30/2019   HGBA1C 5.3 09/23/2019      Assessment & Plan:

## 2020-01-29 NOTE — Patient Instructions (Addendum)
Ok to just stop the cymbalta  Please take all new medication as prescribed - the effexor 150 mg  Please check your BP on a regular basis at home, with the goal being to be mostly less than 140/90 (or 130/80 may be better)  You will be contacted regarding the referral for: urology  Please continue all other medications as before, and refills have been done if requested.  Please have the pharmacy call with any other refills you may need.  Please continue your efforts at being more active, low cholesterol diet, and weight control.  Please keep your appointments with your specialists as you may have planned  Please go to the LAB at the blood drawing area for the tests to be done - just the urine specimen today  You will be contacted by phone if any changes need to be made immediately.  Otherwise, you will receive a letter about your results with an explanation, but please check with MyChart first.  Please remember to sign up for MyChart if you have not done so, as this will be important to you in the future with finding out test results, communicating by private email, and scheduling acute appointments online when needed.  Please make an Appointment to return in 3 months, or sooner if needed

## 2020-01-30 NOTE — Telephone Encounter (Signed)
Sent to Dr. John. 

## 2020-01-30 NOTE — Telephone Encounter (Signed)
Pt could not tolerate the cymbalta,, so this is no longer being taken

## 2020-01-31 ENCOUNTER — Encounter: Payer: Self-pay | Admitting: Internal Medicine

## 2020-01-31 DIAGNOSIS — M5432 Sciatica, left side: Secondary | ICD-10-CM | POA: Insufficient documentation

## 2020-01-31 NOTE — Assessment & Plan Note (Signed)
Elevated today, to check bp at home daily for 10 days and call with results

## 2020-01-31 NOTE — Assessment & Plan Note (Signed)
For urine studies

## 2020-01-31 NOTE — Assessment & Plan Note (Addendum)
Chronic persistent permanent for purposes of LT disability, stable overall by history and exam, recent data reviewed with pt, and pt to continue medical treatment as before,  to f/u any worsening symptoms or concerns  I spent 41 minutes in preparing to see the patient by review of recent labs, imaging and procedures, obtaining and reviewing separately obtained history, communicating with the patient and family or caregiver, ordering medications, tests or procedures, and documenting clinical information in the EHR including the differential Dx, treatment, and any further evaluation and other management of left sided sciatica, hematuria, renal stone, uti, hypergycemia, htn, anxiety, depression,

## 2020-01-31 NOTE — Assessment & Plan Note (Signed)
For urology referral 

## 2020-01-31 NOTE — Assessment & Plan Note (Signed)
To continue xanax prn

## 2020-01-31 NOTE — Assessment & Plan Note (Signed)
stable overall by history and exam, recent data reviewed with pt, and pt to continue medical treatment as before,  to f/u any worsening symptoms or concerns  

## 2020-01-31 NOTE — Assessment & Plan Note (Signed)
Etiology unclear, for referral urology

## 2020-01-31 NOTE — Assessment & Plan Note (Signed)
To start effexor xr asd,  to f/u any worsening symptoms or concerns

## 2020-02-03 ENCOUNTER — Ambulatory Visit: Payer: BC Managed Care – PPO | Admitting: Physical Medicine & Rehabilitation

## 2020-02-11 ENCOUNTER — Telehealth: Payer: Self-pay | Admitting: Internal Medicine

## 2020-02-11 MED ORDER — TRAZODONE HCL 50 MG PO TABS: 50.0000 mg | ORAL_TABLET | Freq: Every evening | ORAL | 0 refills | Status: DC | PRN

## 2020-02-11 NOTE — Telephone Encounter (Signed)
Sent to Dr. John. 

## 2020-02-11 NOTE — Telephone Encounter (Signed)
1.Medication Requested: traZODone (DESYREL) 50 MG tablet    2. Pharmacy (Name, Street, [place]):  [facility], [address]   3. On Med List: yes   4. Last Visit with PCP: 12.2.21  5. Next visit date with PCP: n/a   Agent: Please be advised that RX refills may take up to 3 business days. We ask that you follow-up with your pharmacy.

## 2020-02-12 ENCOUNTER — Encounter: Payer: BC Managed Care – PPO | Admitting: Physical Medicine & Rehabilitation

## 2020-02-17 ENCOUNTER — Ambulatory Visit
Admission: RE | Admit: 2020-02-17 | Discharge: 2020-02-17 | Disposition: A | Discharge status: Home / Self Care | Payer: BC Managed Care – PPO | Source: Ambulatory Visit | Attending: Internal Medicine | Admitting: Internal Medicine

## 2020-02-17 ENCOUNTER — Other Ambulatory Visit: Payer: Self-pay

## 2020-02-17 DIAGNOSIS — Z1231 Encounter for screening mammogram for malignant neoplasm of breast: Secondary | ICD-10-CM

## 2020-02-23 ENCOUNTER — Other Ambulatory Visit: Payer: Self-pay

## 2020-02-23 ENCOUNTER — Encounter: Payer: Self-pay | Admitting: Obstetrics and Gynecology

## 2020-02-23 ENCOUNTER — Ambulatory Visit (INDEPENDENT_AMBULATORY_CARE_PROVIDER_SITE_OTHER): Payer: BC Managed Care – PPO | Admitting: Obstetrics and Gynecology

## 2020-02-23 VITALS — BP 140/86

## 2020-02-23 DIAGNOSIS — R1032 Left lower quadrant pain: Secondary | ICD-10-CM | POA: Diagnosis not present

## 2020-02-23 DIAGNOSIS — N898 Other specified noninflammatory disorders of vagina: Secondary | ICD-10-CM | POA: Diagnosis not present

## 2020-02-23 DIAGNOSIS — R102 Pelvic and perineal pain: Secondary | ICD-10-CM

## 2020-02-23 DIAGNOSIS — R350 Frequency of micturition: Secondary | ICD-10-CM

## 2020-02-23 LAB — WET PREP FOR TRICH, YEAST, CLUE

## 2020-02-23 NOTE — Progress Notes (Signed)
Shanecia Hoganson [facility] Jan 09, 1961 [id number]  SUBJECTIVE:  59 y.o. G3P3 female presents for evaluation of pelvic pressure and left lower quadrant pain. 1 week of pelvic pressure worse with standing, mostly in left lower quadrant.  Constant but worse with standing.  No vaginal bleeding or discharge.  Is having some increased urinary frequency.  No alleviating factors.  Having a daily bowel movement and no issues with constipation.  Denies any problems with dietary changes.  No nausea or vomiting.  She passed a kidney stone back in October.  She had a pelvic ultrasound 04/2018 and then we proceeded with cervical dilation and drainage of endometrial fluid 12/2019.  No tissue was obtained nor sent for analysis.  She was having lower abdominal pains intermittently and cramping at that time which led her to request to have the fluid drained, no change in her symptoms after that.   Current Outpatient Medications  Medication Sig Dispense Refill  . acetaminophen (TYLENOL) 500 MG tablet Take 1,000 mg by mouth every 6 (six) hours as needed for mild pain.    Marland Kitchen albuterol (VENTOLIN HFA) 108 (90 Base) MCG/ACT inhaler Inhale 2 puffs into the lungs every 6 (six) hours as needed for wheezing or shortness of breath. 18 g 0  . amLODipine (NORVASC) 5 MG tablet Take 1 tablet (5 mg total) by mouth daily. 90 tablet 3  . aspirin EC 81 MG tablet Take 1 tablet (81 mg total) by mouth daily. Swallow whole. 30 tablet 11  . atorvastatin (LIPITOR) 10 MG tablet Take 1 tablet (10 mg total) by mouth daily. 90 tablet 3  . cetirizine (ZYRTEC) 10 MG tablet Take 10 mg by mouth daily as needed for allergies.    . DULoxetine (CYMBALTA) 30 MG capsule Take by mouth.    Marland Kitchen HYDROcodone-acetaminophen (NORCO/VICODIN) 5-325 MG tablet Take 1 tablet by mouth every 6 (six) hours as needed for moderate pain. 60 tablet 0  . lisinopril (ZESTRIL) 10 MG tablet Take by mouth.    Marland Kitchen LORazepam (ATIVAN) 0.5 MG tablet Take 1 tablet (0.5 mg total) by mouth 2 (two)  times daily as needed. for anxiety 60 tablet 2  . nicotine polacrilex (NICORETTE) 4 MG gum Take 1 each (4 mg total) by mouth as needed for smoking cessation. 100 tablet 5  . ondansetron (ZOFRAN) 8 MG tablet Take 1 tablet (8 mg total) by mouth every 8 (eight) hours as needed for nausea or vomiting. 20 tablet 0  . pantoprazole (PROTONIX) 40 MG tablet Take 1 tablet (40 mg total) by mouth daily. 90 tablet 3  . traZODone (DESYREL) 50 MG tablet Take 1 tablet (50 mg total) by mouth at bedtime as needed for sleep. 90 tablet 0  . venlafaxine XR (EFFEXOR XR) 150 MG 24 hr capsule Take 1 capsule (150 mg total) by mouth daily with breakfast. 90 capsule 3   No current facility-administered medications for this visit.   Allergies: Flexeril [cyclobenzaprine], Lisinopril, Other, Robaxin [methocarbamol], Contrast media [iodinated diagnostic agents], Gabapentin, and Lyrica [pregabalin]  Patient's last menstrual period was 08/22/2010.  Past medical history,surgical history, problem list, medications, allergies, family history and social history were all reviewed and documented as reviewed in the EPIC chart.  ROS: Pertinent positives and negatives as reviewed in HPI   OBJECTIVE:  BP 140/86   LMP 08/22/2010  The patient appears well, alert, oriented x 3, in no distress. ABDOMEN: Soft, nondistended, diffusely tender to deep palpation in the bilateral lower quadrants, left greater than right PELVIC EXAM: VULVA: normal  appearing vulva with no masses, tenderness or lesions, VAGINA: normal appearing vagina with normal color and scant white discharge, no lesions, tenderness noted over anterior vaginal wall, CERVIX: normal appearing cervix without discharge or lesions, UTERUS: uterus is normal size, shape, consistency and nontender, ADNEXA: normal adnexa in size, no masses, tenderness noted in bilateral lower quadrants, WET MOUNT done - results: negative for pathogens, normal epithelial cells Analysis  unremarkable  Chaperone: Berna Spare present during the examination  ASSESSMENT:  59 y.o. G3P3 here for evaluation of pelvic pressure and left lower quadrant pain  PLAN:  Bladder tenderness noted with negative UA.  Question if any bladder inflammation such as interstitial cystitis.  Also possible that she could be having abdominal wall tenderness.  She reports no symptoms that would make me think constipation or bowel pathology would be causing her symptoms at this time. However given the bilateral lower quadrant tenderness I do want to rule out adnexal pathology as we cannot really get a good examination on the bimanual today due to her discomfort.  I have ordered a pelvic ultrasound to more thoroughly evaluate.  Also will follow up on the appearance of the endometrial fluid, but I do not think that this would be causing her the current symptoms.  If findings are negative there then I would recommend that she see urology for further work-up and management.   Theresia Majors MD 02/23/20

## 2020-02-24 ENCOUNTER — Emergency Department (HOSPITAL_COMMUNITY): Payer: BC Managed Care – PPO

## 2020-02-24 ENCOUNTER — Telehealth: Payer: Self-pay | Admitting: Internal Medicine

## 2020-02-24 ENCOUNTER — Encounter (HOSPITAL_COMMUNITY): Payer: Self-pay | Admitting: Emergency Medicine

## 2020-02-24 ENCOUNTER — Emergency Department (HOSPITAL_COMMUNITY)
Admission: EM | Admit: 2020-02-24 | Discharge: 2020-02-24 | Disposition: A | Discharge status: Home / Self Care | Payer: BC Managed Care – PPO | Attending: Emergency Medicine | Admitting: Emergency Medicine

## 2020-02-24 DIAGNOSIS — Z7982 Long term (current) use of aspirin: Secondary | ICD-10-CM | POA: Insufficient documentation

## 2020-02-24 DIAGNOSIS — I10 Essential (primary) hypertension: Secondary | ICD-10-CM

## 2020-02-24 DIAGNOSIS — R1030 Lower abdominal pain, unspecified: Secondary | ICD-10-CM | POA: Diagnosis present

## 2020-02-24 DIAGNOSIS — N189 Chronic kidney disease, unspecified: Secondary | ICD-10-CM | POA: Insufficient documentation

## 2020-02-24 DIAGNOSIS — I129 Hypertensive chronic kidney disease with stage 1 through stage 4 chronic kidney disease, or unspecified chronic kidney disease: Secondary | ICD-10-CM | POA: Diagnosis not present

## 2020-02-24 DIAGNOSIS — Z8673 Personal history of transient ischemic attack (TIA), and cerebral infarction without residual deficits: Secondary | ICD-10-CM | POA: Diagnosis not present

## 2020-02-24 DIAGNOSIS — F17210 Nicotine dependence, cigarettes, uncomplicated: Secondary | ICD-10-CM | POA: Diagnosis not present

## 2020-02-24 DIAGNOSIS — J45909 Unspecified asthma, uncomplicated: Secondary | ICD-10-CM | POA: Insufficient documentation

## 2020-02-24 DIAGNOSIS — Z79899 Other long term (current) drug therapy: Secondary | ICD-10-CM | POA: Diagnosis not present

## 2020-02-24 LAB — URINALYSIS, COMPLETE W/RFL CULTURE
Bacteria, UA: NONE SEEN /HPF
Bilirubin Urine: NEGATIVE
Glucose, UA: NEGATIVE
Hgb urine dipstick: NEGATIVE
Hyaline Cast: NONE SEEN /LPF
Ketones, ur: NEGATIVE
Leukocyte Esterase: NEGATIVE
Nitrites, Initial: NEGATIVE
Protein, ur: NEGATIVE
RBC / HPF: NONE SEEN /HPF (ref 0–2)
Specific Gravity, Urine: 1.015 (ref 1.001–1.03)
pH: 7 (ref 5.0–8.0)

## 2020-02-24 LAB — COMPREHENSIVE METABOLIC PANEL
ALT: 78 U/L — ABNORMAL HIGH (ref 0–44)
AST: 55 U/L — ABNORMAL HIGH (ref 15–41)
Albumin: 3.7 g/dL (ref 3.5–5.0)
Alkaline Phosphatase: 127 U/L — ABNORMAL HIGH (ref 38–126)
Anion gap: 8 (ref 5–15)
BUN: 10 mg/dL (ref 6–20)
CO2: 23 mmol/L (ref 22–32)
Calcium: 9.3 mg/dL (ref 8.9–10.3)
Chloride: 109 mmol/L (ref 98–111)
Creatinine, Ser: 0.6 mg/dL (ref 0.44–1.00)
GFR, Estimated: >60 mL/min (ref >60)
Glucose, Bld: 113 mg/dL — ABNORMAL HIGH (ref 70–99)
Potassium: 3.5 mmol/L (ref 3.5–5.1)
Sodium: 140 mmol/L (ref 135–145)
Total Bilirubin: 0.7 mg/dL (ref 0.3–1.2)
Total Protein: 6.5 g/dL (ref 6.5–8.1)

## 2020-02-24 LAB — LIPASE, BLOOD: Lipase: 31 U/L (ref 11–51)

## 2020-02-24 LAB — URINALYSIS, ROUTINE W REFLEX MICROSCOPIC
Bilirubin Urine: NEGATIVE
Glucose, UA: NEGATIVE mg/dL
Hgb urine dipstick: NEGATIVE
Ketones, ur: NEGATIVE mg/dL
Leukocytes,Ua: NEGATIVE
Nitrite: NEGATIVE
Protein, ur: NEGATIVE mg/dL
Specific Gravity, Urine: 1.01 (ref 1.005–1.030)
pH: 6 (ref 5.0–8.0)

## 2020-02-24 LAB — CBC
HCT: 45.1 % (ref 36.0–46.0)
Hemoglobin: 14.3 g/dL (ref 12.0–15.0)
MCH: 28.7 pg (ref 26.0–34.0)
MCHC: 31.7 g/dL (ref 30.0–36.0)
MCV: 90.6 fL (ref 80.0–100.0)
Platelets: 158 10*3/uL (ref 150–400)
RBC: 4.98 MIL/uL (ref 3.87–5.11)
RDW: 15 % (ref 11.5–15.5)
WBC: 4.3 10*3/uL (ref 4.0–10.5)
nRBC: 0 % (ref 0.0–0.2)

## 2020-02-24 MED ORDER — ACETAMINOPHEN 500 MG PO TABS
1000.0000 mg | ORAL_TABLET | Freq: Once | ORAL | Status: AC
  Administered 2020-02-24: 1000 mg via ORAL
  Filled 2020-02-24: qty 2

## 2020-02-24 NOTE — ED Triage Notes (Signed)
Pt reports lower abd pain with some nausea, denies diarrhea or urinary symptoms, states she saw GYN yesterday who told her she had a prolapsed bladder.

## 2020-02-24 NOTE — ED Provider Notes (Signed)
[facility] EMERGENCY DEPARTMENT Provider Note   CSN: [id number] Arrival date & time: 02/24/20  0857     History Chief Complaint  Patient presents with  . Abdominal Pain    [name] Walker is a 59 y.o. female.  Patient c/o lower abd pain in the past week. Symptoms acute onset, moderate, constant, dull, persistent, non radiating. States saw gyn yesterday for same, no specific diagnosis then. No fever/chills. Had normal bm today. No vomiting. No back or flank pain. No dysuria or hematuria. No vaginal discharge or bleeding. Notes hx kidney stone and hx colon diverticula.   The history is provided by the patient.  Abdominal Pain Associated symptoms: no chest pain, no chills, no constipation, no cough, no dysuria, no fever, no shortness of breath, no sore throat, no vaginal bleeding, no vaginal discharge and no vomiting        Past Medical History:  Diagnosis Date  . ACHILLES TENDINITIS   . ALLERGIC RHINITIS   . Allergy   . Anginal pain (HCC) 04/04/2016   Pt currently feels like she is having muscle spasms and aching in her chest  . Anxiety   . Asthma   . Chronic kidney disease   . GERD   . History of kidney stones   . HYPERTENSION   . LATERAL EPICONDYLITIS, RIGHT   . LOW BACK PAIN   . Plantar fascial fibromatosis   . SUBACROMIAL BURSITIS, RIGHT   . Thoracic scoliosis 10/01/2015  . TIA (transient ischemic attack)     Patient Active Problem List   Diagnosis Date Noted  . Left sided sciatica 01/31/2020  . Depression 01/29/2020  . Hematuria 01/29/2020  . Left-sided weakness 10/31/2019  . Balance disorder 10/31/2019  . Difficulty with speech 10/02/2019  . Insomnia 10/02/2019  . TIA (transient ischemic attack) 09/15/2019  . HLD (hyperlipidemia) 04/14/2019  . Acute ear pain, bilateral 04/14/2019  . Vitamin D deficiency 04/14/2019  . Anxiety 10/06/2018  . Neck pain 09/10/2018  . Hand swelling 09/10/2018  . Bilateral radiating leg pain 04/23/2018  .  Myalgia 04/23/2018  . Right renal stone 04/03/2018  . Smoker 04/29/2016  . Bilateral hand pain 04/29/2016  . HNP (herniated nucleus pulposus), cervical 04/07/2016  . Cervical radiculopathy, acute 01/29/2016  . Acute upper respiratory infection 01/29/2016  . Degenerative disc disease, cervical 01/11/2016  . Hyperglycemia 11/17/2015  . Angioedema 10/12/2015  . Bilateral lower extremity edema 10/12/2015  . Mouth sores 10/12/2015  . Hypokalemia 10/12/2015  . Thoracic scoliosis 10/01/2015  . Peripheral edema 10/01/2015  . Eczema 10/01/2015  . Asthma 10/01/2015  . Breast lump on right side at 4 o'clock position 01/21/2014  . Hot flashes 10/01/2013  . Lumbar radiculopathy 07/17/2012  . Grief reaction 07/17/2012  . Family history of colon cancer 07/02/2012  . OAB (overactive bladder) 03/01/2012  . Encounter for well adult exam with abnormal findings 08/23/2010  . Plantar fasciitis 08/23/2010  . Dysuria 01/05/2010  . UTI (urinary tract infection) 10/19/2008  . ANKLE PAIN, BILATERAL 10/19/2008  . VAGINITIS 09/10/2008  . JOINT EFFUSION, ANKLE 09/10/2008  . SUBACROMIAL BURSITIS, RIGHT 09/01/2008  . ACHILLES TENDINITIS 09/01/2008  . LATERAL EPICONDYLITIS, RIGHT 08/18/2008  . Essential hypertension 07/12/2007  . Allergic rhinitis 07/12/2007  . GERD 07/12/2007  . Chronic low back pain 07/12/2007  . NEPHROLITHIASIS, HX OF 07/12/2007    Past Surgical History:  Procedure Laterality Date  . ANTERIOR CERVICAL DECOMP/DISCECTOMY FUSION N/A 04/07/2016   Procedure: Cervical two-three Anterior cervical decompression/discectomy/fusion;  Surgeon:  Ashok Pall, MD;  Location: [place];  Service: Neurosurgery;  Laterality: N/A;  . BACK SURGERY  2015   lower back  . EXTRACORPOREAL SHOCK WAVE LITHOTRIPSY Left 09/28/2016   Procedure: LEFT EXTRACORPOREAL SHOCK WAVE LITHOTRIPSY (ESWL);  Surgeon: Festus Aloe, MD;  Location: [facility];  Service: Urology;  Laterality: Left;  . EXTRACORPOREAL SHOCK WAVE  LITHOTRIPSY Right 04/08/2018   Procedure: EXTRACORPOREAL SHOCK WAVE LITHOTRIPSY (ESWL);  Surgeon: Ardis Hughs, MD;  Location: [facility];  Service: Urology;  Laterality: Right;  . KIDNEY SURGERY    . LITHOTRIPSY  05/2016  . TUBAL LIGATION       OB History    Gravida  3   Para  3   Term      Preterm      AB      Living  3     SAB      IAB      Ectopic      Multiple      Live Births              Family History  Problem Relation Age of Onset  . Cancer Sister        colon cancer  . Colon cancer Sister   . Diabetes Mother   . Cancer Father        Prostate  . Colon cancer Father   . Breast cancer Maternal Aunt        50's  . Esophageal cancer Neg Hx   . Stomach cancer Neg Hx     Social History   Tobacco Use  . Smoking status: Current Every Day Smoker    Packs/day: 0.50    Years: 20.00    Pack years: 10.00    Types: Cigarettes  . Smokeless tobacco: Never Used  Vaping Use  . Vaping Use: Never used  Substance Use Topics  . Alcohol use: No  . Drug use: No    Home Medications Prior to Admission medications   Medication Sig Start Date End Date Taking? Authorizing Provider  acetaminophen (TYLENOL) 500 MG tablet Take 1,000 mg by mouth every 6 (six) hours as needed for mild pain.    [provider]  albuterol (VENTOLIN HFA) 108 (90 Base) MCG/ACT inhaler Inhale 2 puffs into the lungs every 6 (six) hours as needed for wheezing or shortness of breath. 11/02/19   Tacy Learn, PA-C  amLODipine (NORVASC) 5 MG tablet Take 1 tablet (5 mg total) by mouth daily. 04/14/19   Biagio Borg, MD  aspirin EC 81 MG tablet Take 1 tablet (81 mg total) by mouth daily. Swallow whole. 09/15/19   Biagio Borg, MD  atorvastatin (LIPITOR) 10 MG tablet Take 1 tablet (10 mg total) by mouth daily. 09/15/19   Biagio Borg, MD  cetirizine (ZYRTEC) 10 MG tablet Take 10 mg by mouth daily as needed for allergies.    [provider]  DULoxetine (CYMBALTA) 30 MG  capsule Take by mouth. 01/28/20   [provider]  HYDROcodone-acetaminophen (NORCO/VICODIN) 5-325 MG tablet Take 1 tablet by mouth every 6 (six) hours as needed for moderate pain. 10/31/19   Biagio Borg, MD  lisinopril (ZESTRIL) 10 MG tablet Take by mouth.    [provider]  LORazepam (ATIVAN) 0.5 MG tablet Take 1 tablet (0.5 mg total) by mouth 2 (two) times daily as needed. for anxiety 01/12/20   Biagio Borg, MD  nicotine polacrilex (NICORETTE) 4 MG gum Take 1 each (  4 mg total) by mouth as needed for smoking cessation. 10/31/19   Biagio Borg, MD  ondansetron (ZOFRAN) 8 MG tablet Take 1 tablet (8 mg total) by mouth every 8 (eight) hours as needed for nausea or vomiting. 01/02/20   Marrian Salvage, FNP  pantoprazole (PROTONIX) 40 MG tablet Take 1 tablet (40 mg total) by mouth daily. 04/14/19   Biagio Borg, MD  traZODone (DESYREL) 50 MG tablet Take 1 tablet (50 mg total) by mouth at bedtime as needed for sleep. 02/11/20   Biagio Borg, MD  venlafaxine XR (EFFEXOR XR) 150 MG 24 hr capsule Take 1 capsule (150 mg total) by mouth daily with breakfast. 01/29/20   Biagio Borg, MD    Allergies    Flexeril [cyclobenzaprine], Lisinopril, Other, Robaxin [methocarbamol], Contrast media [iodinated diagnostic agents], Gabapentin, and Lyrica [pregabalin]  Review of Systems   Review of Systems  Constitutional: Negative for chills and fever.  HENT: Negative for sore throat.   Eyes: Negative for redness.  Respiratory: Negative for cough and shortness of breath.   Cardiovascular: Negative for chest pain.  Gastrointestinal: Positive for abdominal pain. Negative for constipation and vomiting.  Genitourinary: Negative for dysuria, flank pain, vaginal bleeding and vaginal discharge.  Musculoskeletal: Negative for back pain.  Skin: Negative for rash.  Neurological: Negative for headaches.  Hematological: Does not bruise/bleed easily.  Psychiatric/Behavioral: Negative for confusion.     Physical Exam Updated Vital Signs BP (!) 143/98   Pulse 78   Temp 98.2 F (36.8 C) (Oral)   Resp 18   Ht 1.524 m (5')   Wt 70.3 kg   LMP 08/22/2010   SpO2 99%   BMI 30.27 kg/m   Physical Exam Vitals and nursing note reviewed.  Constitutional:      Appearance: Normal appearance. She is well-developed.  HENT:     Head: Atraumatic.     Nose: Nose normal.     Mouth/Throat:     Mouth: Mucous membranes are moist.  Eyes:     General: No scleral icterus.    Conjunctiva/sclera: Conjunctivae normal.  Neck:     Trachea: No tracheal deviation.  Cardiovascular:     Rate and Rhythm: Normal rate and regular rhythm.     Pulses: Normal pulses.     Heart sounds: Normal heart sounds. No murmur heard. No friction rub. No gallop.   Pulmonary:     Effort: Pulmonary effort is normal. No respiratory distress.     Breath sounds: Normal breath sounds.  Abdominal:     General: Bowel sounds are normal. There is no distension.     Palpations: Abdomen is soft.     Tenderness: There is abdominal tenderness. There is no guarding.     Comments: Bilateral lower abd tenderness.   Genitourinary:    Comments: No cva tenderness.  Musculoskeletal:        General: No swelling.     Cervical back: Normal range of motion and neck supple. No rigidity. No muscular tenderness.  Skin:    General: Skin is warm and dry.     Findings: No rash.  Neurological:     Mental Status: She is alert.     Comments: Alert, speech normal.   Psychiatric:        Mood and Affect: Mood normal.     ED Results / Procedures / Treatments   Labs (all labs ordered are listed, but only abnormal results are displayed) Results for orders placed or performed during  the hospital encounter of 02/24/20  Lipase, blood  Result Value Ref Range   Lipase 31 11 - 51 U/L  Comprehensive metabolic panel  Result Value Ref Range   Sodium 140 135 - 145 mmol/L   Potassium 3.5 3.5 - 5.1 mmol/L   Chloride 109 98 - 111 mmol/L   CO2 23  22 - 32 mmol/L   Glucose, Bld 113 (H) 70 - 99 mg/dL   BUN 10 6 - 20 mg/dL   Creatinine, Ser 0.56 0.44 - 1.00 mg/dL   Calcium 9.3 8.9 - 97.9 mg/dL   Total Protein 6.5 6.5 - 8.1 g/dL   Albumin 3.7 3.5 - 5.0 g/dL   AST 55 (H) 15 - 41 U/L   ALT 78 (H) 0 - 44 U/L   Alkaline Phosphatase 127 (H) 38 - 126 U/L   Total Bilirubin 0.7 0.3 - 1.2 mg/dL   GFR, Estimated >48 >01 mL/min   Anion gap 8 5 - 15  CBC  Result Value Ref Range   WBC 4.3 4.0 - 10.5 K/uL   RBC 4.98 3.87 - 5.11 MIL/uL   Hemoglobin 14.3 12.0 - 15.0 g/dL   HCT 65.5 37.4 - 82.7 %   MCV 90.6 80.0 - 100.0 fL   MCH 28.7 26.0 - 34.0 pg   MCHC 31.7 30.0 - 36.0 g/dL   RDW 07.8 67.5 - 44.9 %   Platelets 158 150 - 400 K/uL   nRBC 0.0 0.0 - 0.2 %  Urinalysis, Routine w reflex microscopic Urine, Clean Catch  Result Value Ref Range   Color, Urine YELLOW YELLOW   APPearance CLEAR CLEAR   Specific Gravity, Urine 1.010 1.005 - 1.030   pH 6.0 5.0 - 8.0   Glucose, UA NEGATIVE NEGATIVE mg/dL   Hgb urine dipstick NEGATIVE NEGATIVE   Bilirubin Urine NEGATIVE NEGATIVE   Ketones, ur NEGATIVE NEGATIVE mg/dL   Protein, ur NEGATIVE NEGATIVE mg/dL   Nitrite NEGATIVE NEGATIVE   Leukocytes,Ua NEGATIVE NEGATIVE   MM 3D SCREEN BREAST BILATERAL  Result Date: 02/19/2020 CLINICAL DATA:  Screening. EXAM: DIGITAL SCREENING BILATERAL MAMMOGRAM WITH TOMO AND CAD COMPARISON:  Previous exam(s). ACR Breast Density Category b: There are scattered areas of fibroglandular density. FINDINGS: There are no findings suspicious for malignancy. Images were processed with CAD. IMPRESSION: No mammographic evidence of malignancy. A result letter of this screening mammogram will be mailed directly to the patient. RECOMMENDATION: Screening mammogram in one year. (Code:SM-B-01Y) BI-RADS CATEGORY  1: Negative. Electronically Signed   By: Romona Curls M.D.   On: 02/19/2020 16:59    EKG None  Radiology No results found.  Procedures Procedures (including critical care  time)  Medications Ordered in ED Medications - No data to display  ED Course  I have reviewed the triage vital signs and the nursing notes.  Pertinent labs & imaging results that were available during my care of the patient were reviewed by me and considered in my medical decision making (see chart for details).    MDM Rules/Calculators/A&P                          Iv ns. Stat labs. CT ordered.   Reviewed nursing notes and prior charts for additional history.  Recent gyn eval reviewed.   Labs reviewed/interpreted by me - wbc normal. ua neg for uti. Mild elevation ast/alt - no mid to upper abd or back/flank pain or tenderness. No hx gallstones.   Await CT.  CT reviewed/interpreted by me - no acute intra-abd process.   Acetaminophen po.  Recheck abd soft nt.   Pt currently appears stable for d/c.   Rec pcp f/u.  Return precautions provided.      Final Clinical Impression(s) / ED Diagnoses Final diagnoses:  None    Rx / DC Orders ED Discharge Orders    None       Lajean Saver, MD 02/24/20 1245

## 2020-02-24 NOTE — Telephone Encounter (Signed)
Sent to Dr. Jonny Ruiz as the pt is calling from the ED.

## 2020-02-24 NOTE — Discharge Instructions (Addendum)
It was our pleasure to provide your ER care today - we hope that you feel better.  Your ct scan was read as showing no acute intra-abdominal process. Incidental note was made of a tiny stone in the right kidney. You also do have diverticula of the colon, but no diverticulitis (I.e. no infection) is noted on your scan.   From today's lab tests, a couple of your liver tests are mildly elevated (AST 55, ALT 78) - discuss with your doctor at follow up.   You may try taking acetaminophen or ibuprofen as need.   Follow up with your doctor in the next 1-2 weeks. Also have your blood pressure rechecked then, as it is high today. Continue your blood pressure meds, and limit salt intake.   Return to ER if worse, new symptoms, fevers, new, worsening, or severe pain, persistent vomiting, or other concern.

## 2020-02-24 NOTE — Telephone Encounter (Signed)
Patient called and said she is having lowed abdominal pain and requested to speak w/ Dr. Raphael Gibney assistant. She said that the pain is near her bladder. She can be reached at [phone].

## 2020-02-24 NOTE — Telephone Encounter (Signed)
I would encourage pt to stay at the ED for evaluation

## 2020-03-02 ENCOUNTER — Encounter: Payer: Self-pay | Admitting: Internal Medicine

## 2020-03-02 ENCOUNTER — Other Ambulatory Visit: Payer: Self-pay

## 2020-03-02 ENCOUNTER — Ambulatory Visit (INDEPENDENT_AMBULATORY_CARE_PROVIDER_SITE_OTHER): Payer: BC Managed Care – PPO | Admitting: Internal Medicine

## 2020-03-02 VITALS — BP 140/90 | HR 78 | Temp 98.3°F | Ht 60.0 in | Wt 159.2 lb

## 2020-03-02 DIAGNOSIS — Z23 Encounter for immunization: Secondary | ICD-10-CM | POA: Diagnosis not present

## 2020-03-02 DIAGNOSIS — F5101 Primary insomnia: Secondary | ICD-10-CM

## 2020-03-02 DIAGNOSIS — M519 Unspecified thoracic, thoracolumbar and lumbosacral intervertebral disc disorder: Secondary | ICD-10-CM

## 2020-03-02 DIAGNOSIS — E559 Vitamin D deficiency, unspecified: Secondary | ICD-10-CM

## 2020-03-02 DIAGNOSIS — F17200 Nicotine dependence, unspecified, uncomplicated: Secondary | ICD-10-CM

## 2020-03-02 DIAGNOSIS — I10 Essential (primary) hypertension: Secondary | ICD-10-CM | POA: Diagnosis not present

## 2020-03-02 DIAGNOSIS — R739 Hyperglycemia, unspecified: Secondary | ICD-10-CM

## 2020-03-02 DIAGNOSIS — E7849 Other hyperlipidemia: Secondary | ICD-10-CM | POA: Diagnosis not present

## 2020-03-02 DIAGNOSIS — F418 Other specified anxiety disorders: Secondary | ICD-10-CM

## 2020-03-02 MED ORDER — CITALOPRAM HYDROBROMIDE 20 MG PO TABS: 20.0000 mg | ORAL_TABLET | Freq: Every day | ORAL | 3 refills | Status: DC

## 2020-03-02 NOTE — Patient Instructions (Addendum)
You had the flu shot today  Ok to stop the cymbalta and effexor  Please take all new medication as prescribed - the celexa 20 mg per day  Please continue all other medications as before, and refills have been done if requested.  Please have the pharmacy call with any other refills you may need.  Please continue your efforts at being more active, low cholesterol diet, and weight control.  Please keep your appointments with your specialists as you may have planned  Your form was filled out today  Please make an Appointment to return in 3 months

## 2020-03-02 NOTE — Assessment & Plan Note (Signed)
Lab Results  Component Value Date   LDLCALC 116 (H) 10/03/2018  for f/u lipids, con lipitor 10

## 2020-03-02 NOTE — Progress Notes (Signed)
Established Patient Office Visit  Subjective:  Patient ID: [name] Walker, female    DOB: 01/15/1961  Age: 60 y.o. MRN: [id number]  CC:  Chief Complaint  Patient presents with  . Hospitalization Follow-up    F/u from hospital on 12/28 for abdominal pain. Still having some pain on the lower right side abdomen.   She has been taking Tylenol with some relief.    Also needs a form filled out for her work.  Will get the flu shot today.   as well as smoking, aortic atherosclerosis, HL, anxiety, depression, lumbar disc dz       HPI:  [name] Walker is a 60 y.o. female here with above, seen at ED dec 28 with lower abd pain after recent neg gyn exam, now improved but still mild intermittent, dull without radiation, n/v/d, fever, or blood.  Denies urinary symptoms such as dysuria, frequency, urgency, flank pain, hematuria or n/v, fever, chills.  CT abd/pelvis neg  Has ongoing lumbar disc disease and has been recommended for surgury per ortho Dr Micki Riley but pt has been putting this off, still has persistent left leg pain and mild weakness.  Also has ongoing and mild worsening anxiety and depression, has been some better with counseling, but unable to take the cymbalta 30 as seems to make her 'sick" and depression worse.  Good compliance with lipitor and has not been overly associated with constipation in the psat.  Also still smoking, trying to quit but unable so far. O/w  -   Pt denies chest pain, increasing sob or doe, wheezing, orthopnea, PND, increased LE swelling, palpitations, dizziness or syncope.   Pt denies polydipsia, polyuria,  .        Wt Readings from Last 3 Encounters:  03/02/20 159 lb 3.2 oz (72.2 kg)  02/24/20 155 lb (70.3 kg)  01/29/20 157 lb (71.2 kg)   BP Readings from Last 3 Encounters:  03/02/20 140/90  02/24/20 (!) 129/93  02/23/20 140/86       Past Medical History:  Diagnosis Date  . ACHILLES TENDINITIS   . ALLERGIC RHINITIS   . Allergy   . Anginal pain (HCC)  04/04/2016   Pt currently feels like she is having muscle spasms and aching in her chest  . Anxiety   . Asthma   . Chronic kidney disease   . GERD   . History of kidney stones   . HYPERTENSION   . LATERAL EPICONDYLITIS, RIGHT   . LOW BACK PAIN   . Plantar fascial fibromatosis   . SUBACROMIAL BURSITIS, RIGHT   . Thoracic scoliosis 10/01/2015  . TIA (transient ischemic attack)    Past Surgical History:  Procedure Laterality Date  . ANTERIOR CERVICAL DECOMP/DISCECTOMY FUSION N/A 04/07/2016   Procedure: Cervical two-three Anterior cervical decompression/discectomy/fusion;  Surgeon: Coletta Memos, MD;  Location: [facility] OR;  Service: Neurosurgery;  Laterality: N/A;  . BACK SURGERY  2015   lower back  . EXTRACORPOREAL SHOCK WAVE LITHOTRIPSY Left 09/28/2016   Procedure: LEFT EXTRACORPOREAL SHOCK WAVE LITHOTRIPSY (ESWL);  Surgeon: Jerilee Field, MD;  Location: [facility];  Service: Urology;  Laterality: Left;  . EXTRACORPOREAL SHOCK WAVE LITHOTRIPSY Right 04/08/2018   Procedure: EXTRACORPOREAL SHOCK WAVE LITHOTRIPSY (ESWL);  Surgeon: Crist Fat, MD;  Location: [facility];  Service: Urology;  Laterality: Right;  . KIDNEY SURGERY    . LITHOTRIPSY  05/2016  . TUBAL LIGATION      reports that she has been smoking cigarettes. She has a 10.00  pack-year smoking history. She has never used smokeless tobacco. She reports that she does not drink alcohol and does not use drugs. family history includes Breast cancer in her maternal aunt; Cancer in her father and sister; Colon cancer in her father and sister; Diabetes in her mother. Allergies  Allergen Reactions  . Flexeril [Cyclobenzaprine] Swelling    Tongue swells  . Lisinopril Other (See Comments)    ? Possible tongue swelling   . Other Palpitations    ALL MUSCLE RELAXERS-PER PATIENT  . Robaxin [Methocarbamol] Other (See Comments)    Tongue swelling  . Contrast Media [Iodinated Diagnostic Agents] Itching  . Gabapentin Other (See Comments)  . Lyrica  [Pregabalin]     Anxiety attacks   Current Outpatient Medications on File Prior to Visit  Medication Sig Dispense Refill  . acetaminophen (TYLENOL) 500 MG tablet Take 1,000 mg by mouth every 6 (six) hours as needed for mild pain.    Marland Kitchen albuterol (VENTOLIN HFA) 108 (90 Base) MCG/ACT inhaler Inhale 2 puffs into the lungs every 6 (six) hours as needed for wheezing or shortness of breath. 18 g 0  . amLODipine (NORVASC) 5 MG tablet Take 1 tablet (5 mg total) by mouth daily. 90 tablet 3  . aspirin EC 81 MG tablet Take 1 tablet (81 mg total) by mouth daily. Swallow whole. 30 tablet 11  . atorvastatin (LIPITOR) 10 MG tablet Take 1 tablet (10 mg total) by mouth daily. 90 tablet 3  . cetirizine (ZYRTEC) 10 MG tablet Take 10 mg by mouth daily as needed for allergies.    Marland Kitchen HYDROcodone-acetaminophen (NORCO/VICODIN) 5-325 MG tablet Take 1 tablet by mouth every 6 (six) hours as needed for moderate pain. 60 tablet 0  . lisinopril (ZESTRIL) 10 MG tablet Take by mouth.    Marland Kitchen LORazepam (ATIVAN) 0.5 MG tablet Take 1 tablet (0.5 mg total) by mouth 2 (two) times daily as needed. for anxiety 60 tablet 2  . nicotine polacrilex (NICORETTE) 4 MG gum Take 1 each (4 mg total) by mouth as needed for smoking cessation. 100 tablet 5  . ondansetron (ZOFRAN) 8 MG tablet Take 1 tablet (8 mg total) by mouth every 8 (eight) hours as needed for nausea or vomiting. 20 tablet 0  . pantoprazole (PROTONIX) 40 MG tablet Take 1 tablet (40 mg total) by mouth daily. 90 tablet 3  . traZODone (DESYREL) 50 MG tablet Take 1 tablet (50 mg total) by mouth at bedtime as needed for sleep. 90 tablet 0   No current facility-administered medications on file prior to visit.        ROS:  All others reviewed and negative.  Objective        PE:  BP 140/90   Pulse 78   Temp 98.3 F (36.8 C) (Oral)   Ht 5' (1.524 m)   Wt 159 lb 3.2 oz (72.2 kg)   LMP 08/22/2010   SpO2 98%   BMI 31.09 kg/m                 Constitutional: Pt appears in NAD                HENT: Head: NCAT.                Right Ear: External ear normal.                 Left Ear: External ear normal.  Eyes: . Pupils are equal, round, and reactive to light. Conjunctivae and EOM are normal               Nose: without d/c or deformity               Neck: Neck supple. Gross normal ROM               Cardiovascular: Normal rate and regular rhythm.                 Pulmonary/Chest: Effort normal and breath sounds without rales or wheezing.                Abd:  Soft, NT, ND, + BS, no organomegaly               Neurological: Pt is alert. At baseline orientation, motor grossly intact               Skin: Skin is warm. No rashes, no other new lesions, LE edema -none               Psychiatric: Pt behavior is normal without agitation   Assessment/Plan:  [name] Walker is a 60 y.o. Black or African American [2] female with  has a past medical history of ACHILLES TENDINITIS, ALLERGIC RHINITIS, Allergy, Anginal pain ([place]) (04/04/2016), Anxiety, Asthma, Chronic kidney disease, GERD, History of kidney stones, HYPERTENSION, LATERAL EPICONDYLITIS, RIGHT, LOW BACK PAIN, Plantar fascial fibromatosis, SUBACROMIAL BURSITIS, RIGHT, Thoracic scoliosis (10/01/2015), and TIA (transient ischemic attack).   Assessment Plan  See problem oriented assessment and plan Labs reviewed for each problem: Lab Results  Component Value Date   WBC 4.3 02/24/2020   HGB 14.3 02/24/2020   HCT 45.1 02/24/2020   PLT 158 02/24/2020   GLUCOSE 113 (H) 02/24/2020   CHOL 195 10/03/2018   TRIG 200.0 (H) 10/03/2018   HDL 39.50 10/03/2018   LDLDIRECT 117.0 09/06/2017   LDLCALC 116 (H) 10/03/2018   ALT 78 (H) 02/24/2020   AST 55 (H) 02/24/2020   NA 140 02/24/2020   K 3.5 02/24/2020   CL 109 02/24/2020   CREATININE 0.60 02/24/2020   BUN 10 02/24/2020   CO2 23 02/24/2020   TSH 0.47 09/23/2019   INR 0.9 09/30/2019   HGBA1C 5.3 09/23/2019    Micro: none  Cardiac tracings I have personally  interpreted today:  none  Pertinent Radiological findings (summarize): none   CT Abdomen Pelvis Wo Contrast  - dec 28.2021  IMPRESSION: Punctate right upper pole nephrolithiasis.  No hydronephrosis.  Diffuse colonic diverticulosis.  No active diverticulitis.  Aortic atherosclerosis.  No acute findings.    Health Maintenance Due  Topic Date Due  . COVID-19 Vaccine (3 - Booster for Pfizer series) 11/17/2019    Problem List Items Addressed This Visit      Medium   Vitamin D deficiency    Continue oral replacement      Smoker    To cont to try to quit, declines chantix      Lumbar disc disease    Needs surgury but continues to defer for now, cont to f/u ortho      Insomnia    Chronic persistent, declines other change in tx for now      Hyperglycemia    Lab Results  Component Value Date   HGBA1C 5.3 09/23/2019  stable, cont diet and wt control      HLD (hyperlipidemia)    Lab Results  Component Value Date  [place] 116 (H) 10/03/2018  for f/u lipids, con lipitor 10      Essential hypertension    BP Readings from Last 3 Encounters:  03/02/20 140/90  02/24/20 (!) 129/93  02/23/20 140/86  stable, cont amlodipine and lisiopril      Anxiety with depression    Unable to tolerate cymbalta, ok d/c this and effexor, and start celexa 20 qd      Relevant Medications   citalopram (CELEXA) 20 MG tablet    Other Visit Diagnoses    Need for influenza vaccination    -  Primary   Relevant Orders   Flu Vaccine QUAD 6+ mos PF IM (Fluarix Quad PF) (Completed)      Follow-up: Return in about 3 months (around 05/31/2020).   I spent total 43 minutes in caring for the patient for this visit:  1) by communicating with the patient during the visit  2) by review of pertinent vital sign data, physical examination and labs as documented in the assessment and plan  3) by review of pertinent imaging as above  4) by review of pertinent procedures - none today  5)  by obtaining and reviewing separately obtained information from family/caretaker and Care Everywhere - none today  6) by ordering medications  7) by ordering tests  8) by documenting all of this clinical information in the EHR including the management of each problem noted today in assessment and plan  Cathlean Cower, MD 03/02/2020 2:16 PM [facility]

## 2020-03-02 NOTE — Assessment & Plan Note (Addendum)
Unable to tolerate cymbalta, ok d/c this and effexor, and start celexa 20 qd, cont xanax prn

## 2020-03-02 NOTE — Assessment & Plan Note (Signed)
Chronic persistent, declines other change in tx for now

## 2020-03-02 NOTE — Assessment & Plan Note (Signed)
Lab Results  Component Value Date   HGBA1C 5.3 09/23/2019  stable, cont diet and wt control

## 2020-03-02 NOTE — Assessment & Plan Note (Signed)
BP Readings from Last 3 Encounters:  03/02/20 140/90  02/24/20 (!) 129/93  02/23/20 140/86  stable, cont amlodipine and lisiopril

## 2020-03-02 NOTE — Assessment & Plan Note (Addendum)
S/p recent ESI x 3 recently, now Needs surgury but pt continues to defer for now, cont to f/u ortho

## 2020-03-02 NOTE — Assessment & Plan Note (Signed)
Continue oral replacement.  

## 2020-03-02 NOTE — Assessment & Plan Note (Signed)
To cont to try to quit, declines chantix

## 2020-03-11 ENCOUNTER — Ambulatory Visit: Payer: BC Managed Care – PPO | Admitting: Physical Medicine & Rehabilitation

## 2020-03-18 ENCOUNTER — Ambulatory Visit: Payer: BC Managed Care – PPO | Admitting: Obstetrics and Gynecology

## 2020-03-18 ENCOUNTER — Other Ambulatory Visit: Payer: BC Managed Care – PPO

## 2020-03-24 ENCOUNTER — Encounter: Payer: Self-pay | Admitting: Gastroenterology

## 2020-03-24 ENCOUNTER — Other Ambulatory Visit (INDEPENDENT_AMBULATORY_CARE_PROVIDER_SITE_OTHER): Payer: BC Managed Care – PPO

## 2020-03-24 ENCOUNTER — Other Ambulatory Visit: Payer: Self-pay

## 2020-03-24 ENCOUNTER — Ambulatory Visit (INDEPENDENT_AMBULATORY_CARE_PROVIDER_SITE_OTHER): Payer: BC Managed Care – PPO | Admitting: Gastroenterology

## 2020-03-24 ENCOUNTER — Telehealth: Payer: Self-pay

## 2020-03-24 VITALS — BP 130/88 | HR 71 | Ht 60.0 in | Wt 157.8 lb

## 2020-03-24 DIAGNOSIS — R7989 Other specified abnormal findings of blood chemistry: Secondary | ICD-10-CM

## 2020-03-24 DIAGNOSIS — K219 Gastro-esophageal reflux disease without esophagitis: Secondary | ICD-10-CM | POA: Diagnosis not present

## 2020-03-24 DIAGNOSIS — Z86010 Personal history of colonic polyps: Secondary | ICD-10-CM

## 2020-03-24 DIAGNOSIS — R1030 Lower abdominal pain, unspecified: Secondary | ICD-10-CM

## 2020-03-24 LAB — HEPATIC FUNCTION PANEL
ALT: 24 U/L (ref 0–35)
AST: 21 U/L (ref 0–37)
Albumin: 4.5 g/dL (ref 3.5–5.2)
Alkaline Phosphatase: 109 U/L (ref 39–117)
Bilirubin, Direct: 0.1 mg/dL (ref 0.0–0.3)
Total Bilirubin: 0.4 mg/dL (ref 0.2–1.2)
Total Protein: 7.5 g/dL (ref 6.0–8.3)

## 2020-03-24 MED ORDER — PANTOPRAZOLE SODIUM 40 MG PO TBEC: 40.0000 mg | DELAYED_RELEASE_TABLET | Freq: Two times a day (BID) | ORAL | 11 refills | Status: DC

## 2020-03-24 MED ORDER — GLYCOPYRROLATE 2 MG PO TABS: 2.0000 mg | ORAL_TABLET | Freq: Two times a day (BID) | ORAL | 11 refills | Status: DC

## 2020-03-24 MED ORDER — FAMOTIDINE 40 MG PO TABS: 40.0000 mg | ORAL_TABLET | Freq: Every day | ORAL | 11 refills | Status: DC

## 2020-03-24 NOTE — Telephone Encounter (Signed)
Left message for patient to please call back. 

## 2020-03-24 NOTE — Progress Notes (Signed)
Polyuria   History of Present Illness: This is a 60 year old female complaining of crampy lower abdominal pain and frequent reflux symptoms.  Crampy abdominal pain began several weeks ago when she was evaluated in the ED.  CTAP as below.  She states her pain was initially constant but now it is intermittent.  She describes worsening reflux symptoms particularly with nocturnal reflux and regurgitation but has been more bothersome for the past few weeks. Denies weight loss, constipation, diarrhea, change in stool caliber, melena, hematochezia, nausea, vomiting, dysphagia, chest pain.  CT AP 02/24/2020 IMPRESSION: Punctate right upper pole nephrolithiasis.  No hydronephrosis. Diffuse colonic diverticulosis.  No active diverticulitis. Aortic atherosclerosis. No acute findings  Colonoscopy 10/2017 - Six 7 to 8 mm polyps in the sigmoid colon, in the descending colon and in the transverse colon, removed with a cold snare. Resected and retrieved. - Two 4 to 5 mm polyps in the descending colon and in the ascending colon, removed with a cold biopsy forceps. Resected and retrieved. - Diverticulosis in the sigmoid colon, in the descending colon and in the transverse colon. - The examination was otherwise normal on direct and retroflexion views. Path: 2 tubular adenoma and multiple fragments normal mucosa  Current Medications, Allergies, Past Medical History, Past Surgical History, Family History and Social History were reviewed in [employer] record.   Physical Exam: General: Well developed, well nourished, no acute distress Head: Normocephalic and atraumatic Eyes:  sclerae anicteric, EOMI Ears: Normal auditory acuity Mouth: Not examined, mask on during Covid-19 pandemic Lungs: Clear throughout to auscultation Heart: Regular rate and rhythm; no murmurs, rubs or bruits Abdomen: Soft, non tender and non distended. No masses, hepatosplenomegaly or hernias noted. Normal Bowel  sounds Rectal: Not done Musculoskeletal: Symmetrical with no gross deformities  Pulses:  Normal pulses noted Extremities: No clubbing, cyanosis, edema or deformities noted Neurological: Alert oriented x 4, grossly nonfocal Psychological:  Alert and cooperative. Normal mood and affect   Assessment and Recommendations:  1.  Crampy lower abdominal pain.  CT imaging and blood work unremarkable.  Trial of glycopyrrolate 2 mg p.o. twice daily for now and when improved will change to as needed. REV in 1 month.   2. GERD with nocturnal symptoms.  Closely follow antireflux measures.  Elevate head of bed 4". Increase pantoprazole to 40 mg p.o. twice daily.  Begin famotidine 40 mg at bedtime. Consider EGD for further evaluation. REV in 1 month.   3.  Mildly elevated transaminases and alkaline phosphatase.  Repeat LFTs today. REV in 1 month.   4. Personal history of adenomatous colon polyps.  Family history of colon cancer, sister in her 29s and father in his 69s.  Surveillance colonoscopy recommended in September 2024.

## 2020-03-24 NOTE — Telephone Encounter (Signed)
-----   Message from Ladene Artist, MD sent at 03/24/2020  1:31 PM EST ----- LFTs have normalized Repeat LFTs in 3 months

## 2020-03-24 NOTE — Patient Instructions (Signed)
Your provider has requested that you go to the basement level for lab work before leaving today. Press "B" on the elevator. The lab is located at the first door on the left as you exit the elevator.   Increase your pantoprazole to 40 mg twice daily.   We have sent the following medications to your pharmacy for you to pick up at your convenience: pantoprazole 40 mg twice daily, famotidine 40 mg at bedtime and glycopyrrolate 2 mg twice daily.   Patient advised to avoid spicy, acidic, citrus, chocolate, mints, fruit and fruit juices.  Limit the intake of caffeine, alcohol and Soda.  Don't exercise too soon after eating.  Don't lie down within 3-4 hours of eating.  Elevate the head of your bed.  Due to recent changes in healthcare laws, you may see the results of your imaging and laboratory studies on MyChart before your provider has had a chance to review them.  We understand that in some cases there may be results that are confusing or concerning to you. Not all laboratory results come back in the same time frame and the provider may be waiting for multiple results in order to interpret others.  Please give [place] 48 hours in order for your provider to thoroughly review all the results before contacting the office for clarification of your results.   Normal BMI (Body Mass Index- based on height and weight) is between 19 and 25. Your BMI today is Body mass index is 30.82 kg/m. Marland Kitchen Please consider follow up  regarding your BMI with your Primary Care Provider.  Thank you for choosing me and [facility].  Pricilla Riffle. Dagoberto Ligas., MD., [facility] Regal

## 2020-03-31 ENCOUNTER — Other Ambulatory Visit: Payer: Self-pay

## 2020-03-31 ENCOUNTER — Encounter: Payer: Self-pay | Admitting: Internal Medicine

## 2020-03-31 ENCOUNTER — Ambulatory Visit (INDEPENDENT_AMBULATORY_CARE_PROVIDER_SITE_OTHER): Payer: BC Managed Care – PPO | Admitting: Internal Medicine

## 2020-03-31 VITALS — BP 132/86 | HR 99 | Temp 98.3°F | Ht 60.0 in | Wt 155.0 lb

## 2020-03-31 DIAGNOSIS — I10 Essential (primary) hypertension: Secondary | ICD-10-CM

## 2020-03-31 DIAGNOSIS — F418 Other specified anxiety disorders: Secondary | ICD-10-CM | POA: Diagnosis not present

## 2020-03-31 DIAGNOSIS — K219 Gastro-esophageal reflux disease without esophagitis: Secondary | ICD-10-CM | POA: Diagnosis not present

## 2020-03-31 DIAGNOSIS — M5442 Lumbago with sciatica, left side: Secondary | ICD-10-CM | POA: Diagnosis not present

## 2020-03-31 DIAGNOSIS — Z87442 Personal history of urinary calculi: Secondary | ICD-10-CM

## 2020-03-31 DIAGNOSIS — G8929 Other chronic pain: Secondary | ICD-10-CM

## 2020-03-31 NOTE — Progress Notes (Signed)
Established Patient Office Visit  Subjective:  Patient ID: [name] Walker, female    DOB: 1960-11-29  Age: 60 y.o. MRN: [id number]      Chief Complaint: follow up LBP, hyperglycemia, anxiety, depression, GERD and left renal stone       HPI:  [name] Walker is a 60 y.o. female here to f/u with a form to be filled out for disabilty related to her job, has ongoing mod to severe left lower back pain, worse overall in the past 6 months followed per Dr Thedore Mins ortho, s/p ESI x 3 without lasting improvement, with chronic persistent daily pain 7/10, worse to sit after 20 min and has to stand, but cannot stand then for more than that as well, has known severe lumbar disc disease at L5 s/p left hemilaminectomy with encroachment but no definite nerve impingement by MRI aug 2021.  Pt states was informed she did not need surgury at this time.  Denies worsening depressive symptoms but is at least mild to mod persistent, without suicidal ideation, or panic; has ongoing anxiety as well.  Had recent episode of ? TIA found more likely to be anxiety related per Dr Leonie Man.  Denies worsening reflux, abd pain, dysphagia, n/v, bowel change or blood; and PPI working well.  Has also know left renal stone with LLQ discomfort not clear is stone related, to f/u urology as planned later this week.  Also noted pt has DXA scheduled for feb 21.   .        Wt Readings from Last 3 Encounters:  03/31/20 155 lb (70.3 kg)  03/24/20 157 lb 12.8 oz (71.6 kg)  03/02/20 159 lb 3.2 oz (72.2 kg)   BP Readings from Last 3 Encounters:  03/31/20 132/86  03/24/20 130/88  03/02/20 140/90         Past Medical History:  Diagnosis Date  . ACHILLES TENDINITIS   . ALLERGIC RHINITIS   . Allergy   . Anginal pain ([place]) 04/04/2016   Pt currently feels like she is having muscle spasms and aching in her chest  . Anxiety   . Asthma   . Chronic kidney disease   . GERD   . History of kidney stones   . HYPERTENSION   . LATERAL  EPICONDYLITIS, RIGHT   . LOW BACK PAIN   . Plantar fascial fibromatosis   . SUBACROMIAL BURSITIS, RIGHT   . Thoracic scoliosis 10/01/2015  . TIA (transient ischemic attack)    Past Surgical History:  Procedure Laterality Date  . ANTERIOR CERVICAL DECOMP/DISCECTOMY FUSION N/A 04/07/2016   Procedure: Cervical two-three Anterior cervical decompression/discectomy/fusion;  Surgeon: Ashok Pall, MD;  Location: [facility];  Service: Neurosurgery;  Laterality: N/A;  . BACK SURGERY  2015   lower back  . EXTRACORPOREAL SHOCK WAVE LITHOTRIPSY Left 09/28/2016   Procedure: LEFT EXTRACORPOREAL SHOCK WAVE LITHOTRIPSY (ESWL);  Surgeon: Festus Aloe, MD;  Location: [facility];  Service: Urology;  Laterality: Left;  . EXTRACORPOREAL SHOCK WAVE LITHOTRIPSY Right 04/08/2018   Procedure: EXTRACORPOREAL SHOCK WAVE LITHOTRIPSY (ESWL);  Surgeon: Ardis Hughs, MD;  Location: [facility];  Service: Urology;  Laterality: Right;  . KIDNEY SURGERY    . LITHOTRIPSY  05/2016  . TUBAL LIGATION      reports that she has been smoking cigarettes. She has a 10.00 pack-year smoking history. She has never used smokeless tobacco. She reports that she does not drink alcohol and does not use drugs. family history includes Breast cancer in her maternal aunt;  Colon cancer in her father and sister; Diabetes in her mother; Prostate cancer in her father. Allergies  Allergen Reactions  . Flexeril [Cyclobenzaprine] Swelling    Tongue swells  . Lisinopril Other (See Comments)    ? Possible tongue swelling   . Other Palpitations    ALL MUSCLE RELAXERS-PER PATIENT  . Robaxin [Methocarbamol] Other (See Comments)    Tongue swelling  . Contrast Media [Iodinated Diagnostic Agents] Itching  . Gabapentin Other (See Comments)  . Lyrica [Pregabalin]     Anxiety attacks   Current Outpatient Medications on File Prior to Visit  Medication Sig Dispense Refill  . acetaminophen (TYLENOL) 500 MG tablet Take 1,000 mg by mouth every 6 (six) hours as  needed for mild pain.    Marland Kitchen albuterol (VENTOLIN HFA) 108 (90 Base) MCG/ACT inhaler Inhale 2 puffs into the lungs every 6 (six) hours as needed for wheezing or shortness of breath. 18 g 0  . amLODipine (NORVASC) 5 MG tablet Take 1 tablet (5 mg total) by mouth daily. 90 tablet 3  . aspirin EC 81 MG tablet Take 1 tablet (81 mg total) by mouth daily. Swallow whole. 30 tablet 11  . atorvastatin (LIPITOR) 10 MG tablet Take 1 tablet (10 mg total) by mouth daily. 90 tablet 3  . cetirizine (ZYRTEC) 10 MG tablet Take 10 mg by mouth daily as needed for allergies.    . citalopram (CELEXA) 20 MG tablet Take 1 tablet (20 mg total) by mouth daily. 90 tablet 3  . famotidine (PEPCID) 40 MG tablet Take 1 tablet (40 mg total) by mouth at bedtime. 30 tablet 11  . glycopyrrolate (ROBINUL) 2 MG tablet Take 1 tablet (2 mg total) by mouth 2 (two) times daily. 60 tablet 11  . lisinopril (ZESTRIL) 10 MG tablet Take by mouth.    Marland Kitchen LORazepam (ATIVAN) 0.5 MG tablet Take 1 tablet (0.5 mg total) by mouth 2 (two) times daily as needed. for anxiety 60 tablet 2  . nicotine polacrilex (NICORETTE) 4 MG gum Take 1 each (4 mg total) by mouth as needed for smoking cessation. 100 tablet 5  . ondansetron (ZOFRAN) 8 MG tablet Take 1 tablet (8 mg total) by mouth every 8 (eight) hours as needed for nausea or vomiting. 20 tablet 0  . pantoprazole (PROTONIX) 40 MG tablet Take 1 tablet (40 mg total) by mouth 2 (two) times daily before a meal. 60 tablet 11  . traZODone (DESYREL) 50 MG tablet Take 1 tablet (50 mg total) by mouth at bedtime as needed for sleep. 90 tablet 0  . MYRBETRIQ 25 MG TB24 tablet Take 25 mg by mouth daily.     No current facility-administered medications on file prior to visit.        ROS:  All others reviewed and negative.  Objective        PE:  BP 132/86   Pulse 99   Temp 98.3 F (36.8 C) (Oral)   Ht 5' (1.524 m)   Wt 155 lb (70.3 kg)   LMP 08/22/2010   SpO2 96%   BMI 30.27 kg/m                  Constitutional: Pt appears in NAD               HENT: Head: NCAT.                Right Ear: External ear normal.  Left Ear: External ear normal.                Eyes: . Pupils are equal, round, and reactive to light. Conjunctivae and EOM are normal               Nose: without d/c or deformity               Neck: Neck supple. Gross normal ROM               Cardiovascular: Normal rate and regular rhythm.                 Pulmonary/Chest: Effort normal and breath sounds without rales or wheezing.                Abd:  Soft, NT, ND, + BS, no organomegaly               Neurological: Pt is alert. At baseline orientation, motor grossly intact               Skin: Skin is warm. No rashes, no other new lesions, LE edema - none               Psychiatric: Pt behavior is normal without agitation   Micro: none  Cardiac tracings I have personally interpreted today:  none  Pertinent Radiological findings (summarize): aug 2021 MRI LS spine on file   Lab Results  Component Value Date   WBC 4.3 02/24/2020   HGB 14.3 02/24/2020   HCT 45.1 02/24/2020   PLT 158 02/24/2020   GLUCOSE 113 (H) 02/24/2020   CHOL 195 10/03/2018   TRIG 200.0 (H) 10/03/2018   HDL 39.50 10/03/2018   LDLDIRECT 117.0 09/06/2017   LDLCALC 116 (H) 10/03/2018   ALT 24 03/24/2020   AST 21 03/24/2020   NA 140 02/24/2020   K 3.5 02/24/2020   CL 109 02/24/2020   CREATININE 0.60 02/24/2020   BUN 10 02/24/2020   CO2 23 02/24/2020   TSH 0.47 09/23/2019   INR 0.9 09/30/2019   HGBA1C 5.3 09/23/2019   Assessment/Plan:  [name] Walker is a 60 y.o. Black or African American [2] female with  has a past medical history of ACHILLES TENDINITIS, ALLERGIC RHINITIS, Allergy, Anginal pain ([place]) (04/04/2016), Anxiety, Asthma, Chronic kidney disease, GERD, History of kidney stones, HYPERTENSION, LATERAL EPICONDYLITIS, RIGHT, LOW BACK PAIN, Plantar fascial fibromatosis, SUBACROMIAL BURSITIS, RIGHT, Thoracic scoliosis (10/01/2015), and  TIA (transient ischemic attack).  Chronic low back pain with left-sided sciatica Pt is asking for second opinion regarding management, and will refer to ortho of her request, Dr Lorin Mercy group, cont pain control  Anxiety with depression No evidence for TIA or stoke per neuro regarding symptoms last hospn; declines counseling referral, cont celexa and ativan prn   Essential hypertension BP Readings from Last 3 Encounters:  03/31/20 132/86  03/24/20 130/88  03/02/20 140/90   Stable, pt to continue medical treatment norvasc, lisinopril   Current Outpatient Medications (Cardiovascular):  .  amLODipine (NORVASC) 5 MG tablet, Take 1 tablet (5 mg total) by mouth daily. Marland Kitchen  atorvastatin (LIPITOR) 10 MG tablet, Take 1 tablet (10 mg total) by mouth daily. Marland Kitchen  lisinopril (ZESTRIL) 10 MG tablet, Take by mouth.  Current Outpatient Medications (Respiratory):  .  albuterol (VENTOLIN HFA) 108 (90 Base) MCG/ACT inhaler, Inhale 2 puffs into the lungs every 6 (six) hours as needed for wheezing or shortness of breath. .  cetirizine (ZYRTEC) 10 MG tablet, Take 10  mg by mouth daily as needed for allergies.  Current Outpatient Medications (Analgesics):  .  acetaminophen (TYLENOL) 500 MG tablet, Take 1,000 mg by mouth every 6 (six) hours as needed for mild pain. Marland Kitchen  aspirin EC 81 MG tablet, Take 1 tablet (81 mg total) by mouth daily. Swallow whole.   Current Outpatient Medications (Other):  .  citalopram (CELEXA) 20 MG tablet, Take 1 tablet (20 mg total) by mouth daily. .  famotidine (PEPCID) 40 MG tablet, Take 1 tablet (40 mg total) by mouth at bedtime. Marland Kitchen  glycopyrrolate (ROBINUL) 2 MG tablet, Take 1 tablet (2 mg total) by mouth 2 (two) times daily. Marland Kitchen  LORazepam (ATIVAN) 0.5 MG tablet, Take 1 tablet (0.5 mg total) by mouth 2 (two) times daily as needed. for anxiety .  nicotine polacrilex (NICORETTE) 4 MG gum, Take 1 each (4 mg total) by mouth as needed for smoking cessation. .  ondansetron (ZOFRAN) 8 MG  tablet, Take 1 tablet (8 mg total) by mouth every 8 (eight) hours as needed for nausea or vomiting. .  pantoprazole (PROTONIX) 40 MG tablet, Take 1 tablet (40 mg total) by mouth 2 (two) times daily before a meal. .  traZODone (DESYREL) 50 MG tablet, Take 1 tablet (50 mg total) by mouth at bedtime as needed for sleep. Marland Kitchen  MYRBETRIQ 25 MG TB24 tablet, Take 25 mg by mouth daily.   GERD Symptomatically improved, to continue protonix and pepcid  NEPHROLITHIASIS, HX OF With recent left renal stone, for f/u urology later this wk as planned   Followup: Return in about 3 months (around 06/28/2020).  Cathlean Cower, MD 04/04/2020 2:11 AM [facility]

## 2020-03-31 NOTE — Patient Instructions (Addendum)
You will be contacted regarding the referral for: Dr Lorin Mercy (or at least his group) for the low back pain  Please continue all other medications as before, and refills have been done if requested.  Please have the pharmacy call with any other refills you may need.  Please keep your appointments with your specialists as you may have planned  Please make an Appointment to return in 3 months

## 2020-04-01 ENCOUNTER — Ambulatory Visit: Payer: BC Managed Care – PPO | Admitting: Obstetrics and Gynecology

## 2020-04-01 ENCOUNTER — Other Ambulatory Visit: Payer: BC Managed Care – PPO

## 2020-04-04 ENCOUNTER — Encounter: Payer: Self-pay | Admitting: Internal Medicine

## 2020-04-04 ENCOUNTER — Other Ambulatory Visit: Payer: Self-pay | Admitting: Internal Medicine

## 2020-04-04 NOTE — Assessment & Plan Note (Signed)
BP Readings from Last 3 Encounters:  03/31/20 132/86  03/24/20 130/88  03/02/20 140/90   Stable, pt to continue medical treatment norvasc, lisinopril   Current Outpatient Medications (Cardiovascular):  .  amLODipine (NORVASC) 5 MG tablet, Take 1 tablet (5 mg total) by mouth daily. Marland Kitchen  atorvastatin (LIPITOR) 10 MG tablet, Take 1 tablet (10 mg total) by mouth daily. Marland Kitchen  lisinopril (ZESTRIL) 10 MG tablet, Take by mouth.  Current Outpatient Medications (Respiratory):  .  albuterol (VENTOLIN HFA) 108 (90 Base) MCG/ACT inhaler, Inhale 2 puffs into the lungs every 6 (six) hours as needed for wheezing or shortness of breath. .  cetirizine (ZYRTEC) 10 MG tablet, Take 10 mg by mouth daily as needed for allergies.  Current Outpatient Medications (Analgesics):  .  acetaminophen (TYLENOL) 500 MG tablet, Take 1,000 mg by mouth every 6 (six) hours as needed for mild pain. Marland Kitchen  aspirin EC 81 MG tablet, Take 1 tablet (81 mg total) by mouth daily. Swallow whole.   Current Outpatient Medications (Other):  .  citalopram (CELEXA) 20 MG tablet, Take 1 tablet (20 mg total) by mouth daily. .  famotidine (PEPCID) 40 MG tablet, Take 1 tablet (40 mg total) by mouth at bedtime. Marland Kitchen  glycopyrrolate (ROBINUL) 2 MG tablet, Take 1 tablet (2 mg total) by mouth 2 (two) times daily. Marland Kitchen  LORazepam (ATIVAN) 0.5 MG tablet, Take 1 tablet (0.5 mg total) by mouth 2 (two) times daily as needed. for anxiety .  nicotine polacrilex (NICORETTE) 4 MG gum, Take 1 each (4 mg total) by mouth as needed for smoking cessation. .  ondansetron (ZOFRAN) 8 MG tablet, Take 1 tablet (8 mg total) by mouth every 8 (eight) hours as needed for nausea or vomiting. .  pantoprazole (PROTONIX) 40 MG tablet, Take 1 tablet (40 mg total) by mouth 2 (two) times daily before a meal. .  traZODone (DESYREL) 50 MG tablet, Take 1 tablet (50 mg total) by mouth at bedtime as needed for sleep. Marland Kitchen  MYRBETRIQ 25 MG TB24 tablet, Take 25 mg by mouth daily.

## 2020-04-04 NOTE — Assessment & Plan Note (Signed)
Pt is asking for second opinion regarding management, and will refer to ortho of her request, Dr Lorin Mercy group, cont pain control

## 2020-04-04 NOTE — Assessment & Plan Note (Signed)
Symptomatically improved, to continue protonix and pepcid

## 2020-04-04 NOTE — Assessment & Plan Note (Addendum)
No evidence for TIA or stoke per neuro regarding symptoms last hospn; declines counseling referral, cont celexa and ativan prn

## 2020-04-04 NOTE — Assessment & Plan Note (Signed)
With recent left renal stone, for f/u urology later this wk as planned

## 2020-04-05 ENCOUNTER — Telehealth: Payer: Self-pay | Admitting: Internal Medicine

## 2020-04-05 DIAGNOSIS — Z0279 Encounter for issue of other medical certificate: Secondary | ICD-10-CM

## 2020-04-05 NOTE — Telephone Encounter (Signed)
Patient called and was wondering if her letter was ready. Please call the patient back at [phone]

## 2020-04-05 NOTE — Telephone Encounter (Signed)
Patient is calling about forms not a letter.  Forms have been completed for short term disability. Faxed to Doristine Johns @[phone], Copy sent to scan &Charged for.   Patient will pick up original.

## 2020-04-13 ENCOUNTER — Other Ambulatory Visit: Payer: Self-pay

## 2020-04-13 ENCOUNTER — Ambulatory Visit: Payer: BC Managed Care – PPO | Admitting: Obstetrics and Gynecology

## 2020-04-13 ENCOUNTER — Ambulatory Visit (INDEPENDENT_AMBULATORY_CARE_PROVIDER_SITE_OTHER): Payer: BC Managed Care – PPO

## 2020-04-13 ENCOUNTER — Encounter: Payer: Self-pay | Admitting: Obstetrics and Gynecology

## 2020-04-13 VITALS — BP 138/80 | HR 76 | Ht 62.0 in | Wt 152.0 lb

## 2020-04-13 DIAGNOSIS — R1030 Lower abdominal pain, unspecified: Secondary | ICD-10-CM

## 2020-04-13 DIAGNOSIS — R1032 Left lower quadrant pain: Secondary | ICD-10-CM

## 2020-04-13 DIAGNOSIS — R102 Pelvic and perineal pain: Secondary | ICD-10-CM | POA: Diagnosis not present

## 2020-04-13 NOTE — Progress Notes (Signed)
   [name] Walker 1960/05/09 [id number]  SUBJECTIVE:  60 y.o. G3P3 female presents for a pelvic ultrasound to work-up left lower quadrant pelvic pain that we evaluated back at a 01/2020 encounter.  Noted to have significant anterior vaginal wall/bladder tenderness at that time on the physical examination.  Says she saw gastroenterology and they put her on medication for reflux which has not helped the pelvic pain but did help reduce some stomach pains that she was having.  She says she is planning to have endoscopy done soon.   OBJECTIVE:  BP 138/80 (BP Location: Right Arm, Patient Position: Sitting, Cuff Size: Normal)   Pulse 76   Ht 5\' 2"  (1.575 m)   Wt 152 lb (68.9 kg)   LMP 08/22/2010   BMI 27.80 kg/m   Pelvic ultrasound Anteverted uterus 3.6 x 2.6 x 1.9 cm.  Normal size and shape.  No myometrial masses. Thin endometrium 1.9 mm.  No intracavitary fluid noted. Bilateral ovaries are normal in size with normal Doppler perfusion. No adnexal masses.  No free fluid.  ASSESSMENT:  60 y.o. G3P3 with left lower quadrant pain and a normal pelvic ultrasound  PLAN:  I recommended that the patient follow through with GI recommendations and work-up, also recommend that if no findings there that she make a follow-up appointment with urology, possible interstitial cystitis or bladder problem that may be contributing to her discomfort.   Joseph Pierini MD 04/13/20

## 2020-04-16 ENCOUNTER — Other Ambulatory Visit: Payer: Self-pay | Admitting: Nurse Practitioner

## 2020-04-16 DIAGNOSIS — Z780 Asymptomatic menopausal state: Secondary | ICD-10-CM

## 2020-04-19 ENCOUNTER — Ambulatory Visit
Admission: RE | Admit: 2020-04-19 | Discharge: 2020-04-19 | Disposition: A | Discharge status: Home / Self Care | Payer: BC Managed Care – PPO | Source: Ambulatory Visit | Attending: Nurse Practitioner | Admitting: Nurse Practitioner

## 2020-04-19 ENCOUNTER — Other Ambulatory Visit: Payer: BC Managed Care – PPO

## 2020-04-19 ENCOUNTER — Other Ambulatory Visit: Payer: Self-pay

## 2020-04-19 DIAGNOSIS — Z780 Asymptomatic menopausal state: Secondary | ICD-10-CM

## 2020-04-26 ENCOUNTER — Encounter: Payer: Self-pay | Admitting: Gastroenterology

## 2020-04-26 ENCOUNTER — Ambulatory Visit: Payer: BC Managed Care – PPO | Admitting: Gastroenterology

## 2020-04-26 ENCOUNTER — Telehealth: Payer: Self-pay | Admitting: Internal Medicine

## 2020-04-26 VITALS — BP 126/90 | HR 76 | Ht 60.25 in | Wt 155.0 lb

## 2020-04-26 DIAGNOSIS — R1032 Left lower quadrant pain: Secondary | ICD-10-CM

## 2020-04-26 DIAGNOSIS — K219 Gastro-esophageal reflux disease without esophagitis: Secondary | ICD-10-CM | POA: Diagnosis not present

## 2020-04-26 NOTE — Patient Instructions (Signed)
Stop taking the famotidine at bedtime.   Thank you for choosing me and [facility].  Pricilla Riffle. Dagoberto Ligas., MD., [facility]

## 2020-04-26 NOTE — Telephone Encounter (Signed)
Patient is having some lower back and hip pain. She is asking to be prescribed something for the pain.   Best contact # [phone]  Preferred Pharmacy:   [place], [place] - [address] Phone:  [phone]  Fax:  [phone]

## 2020-04-26 NOTE — Telephone Encounter (Signed)
Patient has dropped off monthly renewal Short Term Disability.  Forms have been completed and placed in providers box to review and sign.

## 2020-04-26 NOTE — Progress Notes (Addendum)
    History of Present Illness: This is a 60 year old female returning for follow-up of lower abdominal pain and GERD.  Her lower abdominal pains have improved substantially although she notes frequent left back pain left hip pain and left lower quadrant pain.  She has known lumbosacral spine disease.  Reflux symptoms are well controlled.  She feels her left back hip and left lower quadrant pain worsen at night after taking famotidine.  Current Medications, Allergies, Past Medical History, Past Surgical History, Family History and Social History were reviewed in [employer] record.   Physical Exam: General: Well developed, well nourished, no acute distress Head: Normocephalic and atraumatic Eyes: Sclerae anicteric, EOMI Ears: Normal auditory acuity Mouth: Not examined, mask on during Covid-19 pandemic Lungs: Clear throughout to auscultation Heart: Regular rate and rhythm; no murmurs, rubs or bruits Abdomen: Soft, non tender and non distended. No masses, hepatosplenomegaly or hernias noted. Normal Bowel sounds Rectal: Not done Musculoskeletal: Symmetrical with no gross deformities  Pulses:  Normal pulses noted Extremities: No clubbing, cyanosis, edema or deformities noted Neurological: Alert oriented x 4, grossly nonfocal Psychological:  Alert and cooperative. Normal mood and affect   Assessment and Recommendations:  1. LLQ abdominal pain.  Her crampy lower abdominal pain has resolved on glycopyrrolate.  Persistent LLQ pain is suspected referred pain from lumbosacral spine disease.  Continue glycopyrrolate 2 mg p.o. twice daily as needed.  2. GERD. Nocturnal symptoms have substantially improved.  Continue pantoprazole 40 mg p.o. twice daily before breakfast and dinner and hold famotidine at bedtime.  Closely follow antireflux measures especially avoiding food and beverages with the 3 hours of bedtime.  REV in 1 year.   3.  Abnormal LFTs.  Repeat LFTs were normal.   Recommend her PCP repeats her LFTs in 6 months.  4.  Personal history of adenomatous colon polyps.  Family history of colon cancer in her sister and father.  Surveillance colonoscopy is recommended in September 2024.

## 2020-04-26 NOTE — Addendum Note (Signed)
Addended by: Lucio Edward T on: 04/26/2020 12:26 PM   Modules accepted: Level of Service

## 2020-04-27 DIAGNOSIS — Z0279 Encounter for issue of other medical certificate: Secondary | ICD-10-CM

## 2020-04-27 MED ORDER — GABAPENTIN 100 MG PO CAPS: 100.0000 mg | ORAL_CAPSULE | Freq: Three times a day (TID) | ORAL | 3 refills | Status: DC

## 2020-04-27 MED ORDER — TRAMADOL HCL 50 MG PO TABS: 50.0000 mg | ORAL_TABLET | Freq: Four times a day (QID) | ORAL | 0 refills | Status: DC | PRN

## 2020-04-27 NOTE — Telephone Encounter (Signed)
Forms have been signed, Faxed to [employer] @[phone], Copy sent to scan, &Charged for.   Patient informed and will pick up original.

## 2020-04-27 NOTE — Telephone Encounter (Signed)
Ok to try tramadol prn  Please let pt know to disregard the gabapentin I accidentally sent in , as she has difficulty with this medication in the past

## 2020-04-28 NOTE — Telephone Encounter (Signed)
Patient verbally understood   Signed               Signed        Ok to try tramadol prn  Please let pt know to disregard the gabapentin I accidentally sent in , as she has difficulty with this medication in the past

## 2020-05-05 ENCOUNTER — Ambulatory Visit (INDEPENDENT_AMBULATORY_CARE_PROVIDER_SITE_OTHER): Payer: BC Managed Care – PPO | Admitting: Orthopaedic Surgery

## 2020-05-05 ENCOUNTER — Encounter: Payer: Self-pay | Admitting: Orthopaedic Surgery

## 2020-05-05 ENCOUNTER — Other Ambulatory Visit: Payer: Self-pay

## 2020-05-05 VITALS — BP 126/86 | HR 65 | Ht 62.0 in | Wt 155.0 lb

## 2020-05-05 DIAGNOSIS — M519 Unspecified thoracic, thoracolumbar and lumbosacral intervertebral disc disorder: Secondary | ICD-10-CM

## 2020-05-05 NOTE — Progress Notes (Signed)
Office Visit Note   Patient: [name] Walker           Date of Birth: [birth year]           MRN: [id number] Visit Date: 05/05/2020              Requested by: Biagio Borg, MD [place],  [place] [id number] PCP: Biagio Borg, MD   Assessment & Plan: Visit Diagnoses:  1. Lumbar disc disease     Plan: We discussed a walking program core strengthening program.  MRI scan was reviewed report as well as images.  Outlined previous treatment reviewed.  Follow-up as needed.  Follow-Up Instructions: No follow-ups on file.   Orders:  No orders of the defined types were placed in this encounter.  No orders of the defined types were placed in this encounter.     Procedures: No procedures performed   Clinical Data: No additional findings.   Subjective: Chief Complaint  Patient presents with  . Lower Back - Pain    HPI 60-year-old female here for second opinion with complaints of low back pain and leg pain.  Patient states she has numbness and tingling she has tried epidural with did not improve.  She has been through physical therapy.  She is taking Tylenol.  She has been on Cymbalta.  Previous surgery left L5-S1 hemilaminectomy done between 2014 2018.  MRI scan lumbar after procedure showed some scar tissue around the nerve without residual compression.  Transforaminal epidurals last year. Cervical fusion by Dr. Christella Noa C2-3 2018. Review of Systems   Objective: Vital Signs: BP 126/86   Pulse 65   Ht 5\' 2"  (1.575 m)   Wt 155 lb (70.3 kg)   LMP 08/22/2010   BMI 28.35 kg/m   Physical Exam Constitutional:      Appearance: She is well-developed.  HENT:     Head: Normocephalic.     Right Ear: External ear normal.     Left Ear: External ear normal.  Eyes:     Pupils: Pupils are equal, round, and reactive to light.  Neck:     Thyroid: No thyromegaly.     Trachea: No tracheal deviation.  Cardiovascular:     Rate and Rhythm: Normal rate.  Pulmonary:      Effort: Pulmonary effort is normal.  Abdominal:     Palpations: Abdomen is soft.  Skin:    General: Skin is warm and dry.  Neurological:     Mental Status: She is alert and oriented to person, place, and time.  Psychiatric:        Behavior: Behavior normal.     Ortho Exam no lower extremity atrophy.  Anterior tib EHL is intact.  Knee and ankle jerk are intact.  Distal pulses are intact.  Negative straight leg raising to 90 degrees.  Lumbar incision well-healed.  Negative logroll of the hips.  Specialty Comments:  No specialty comments available.  Imaging: Lumbar MRI as well as scanner read by Nelson Chimes 10/02/2019 shows minimal bulge L3-4.  Mild bulge at L4-5.  Facet arthropathy right and left at L4-5.  No compressive stenosis.  L5-S1 shows previous left hemilaminectomy.  Loss of disc space height and endplate osteophytes.  Some foraminal narrowing.  Some epidural fibrosis around the S1 nerve root.  No nerve root compression demonstrated.   PMFS History: Patient Active Problem List   Diagnosis Date Noted  . Chronic low back pain with left-sided sciatica 03/31/2020  .  Lumbar disc disease 03/02/2020  . Left sided sciatica 01/31/2020  . Depression 01/29/2020  . Hematuria 01/29/2020  . Left-sided weakness 10/31/2019  . Balance disorder 10/31/2019  . Difficulty with speech 10/02/2019  . Insomnia 10/02/2019  . HLD (hyperlipidemia) 04/14/2019  . Acute ear pain, bilateral 04/14/2019  . Vitamin D deficiency 04/14/2019  . Anxiety with depression 10/06/2018  . Neck pain 09/10/2018  . Hand swelling 09/10/2018  . Bilateral radiating leg pain 04/23/2018  . Myalgia 04/23/2018  . Right renal stone 04/03/2018  . Smoker 04/29/2016  . Bilateral hand pain 04/29/2016  . HNP (herniated nucleus pulposus), cervical 04/07/2016  . Cervical radiculopathy, acute 01/29/2016  . Acute upper respiratory infection 01/29/2016  . Degenerative disc disease, cervical 01/11/2016  . Hyperglycemia 11/17/2015   . Angioedema 10/12/2015  . Bilateral lower extremity edema 10/12/2015  . Mouth sores 10/12/2015  . Hypokalemia 10/12/2015  . Thoracic scoliosis 10/01/2015  . Peripheral edema 10/01/2015  . Eczema 10/01/2015  . Asthma 10/01/2015  . Breast lump on right side at 4 o'clock position 01/21/2014  . Hot flashes 10/01/2013  . Lumbar radiculopathy 07/17/2012  . Grief reaction 07/17/2012  . Family history of colon cancer 07/02/2012  . OAB (overactive bladder) 03/01/2012  . Encounter for well adult exam with abnormal findings 08/23/2010  . Plantar fasciitis 08/23/2010  . Dysuria 01/05/2010  . UTI (urinary tract infection) 10/19/2008  . ANKLE PAIN, BILATERAL 10/19/2008  . VAGINITIS 09/10/2008  . JOINT EFFUSION, ANKLE 09/10/2008  . SUBACROMIAL BURSITIS, RIGHT 09/01/2008  . ACHILLES TENDINITIS 09/01/2008  . LATERAL EPICONDYLITIS, RIGHT 08/18/2008  . Essential hypertension 07/12/2007  . Allergic rhinitis 07/12/2007  . GERD 07/12/2007  . Chronic low back pain 07/12/2007  . NEPHROLITHIASIS, HX OF 07/12/2007   Past Medical History:  Diagnosis Date  . ACHILLES TENDINITIS   . ALLERGIC RHINITIS   . Allergy   . Anginal pain ([place]) 04/04/2016   Pt currently feels like she is having muscle spasms and aching in her chest  . Anxiety   . Asthma   . Chronic kidney disease   . Depression   . GERD   . History of kidney stones   . HYPERTENSION   . LATERAL EPICONDYLITIS, RIGHT   . LOW BACK PAIN   . Plantar fascial fibromatosis   . Stress   . SUBACROMIAL BURSITIS, RIGHT   . Thoracic scoliosis 10/01/2015  . TIA (transient ischemic attack)     Family History  Problem Relation Age of Onset  . Colon cancer Sister        dx in her 29s  . Diabetes Mother   . Colon cancer Father        dx in his 30s  . Prostate cancer Father   . Breast cancer Maternal Aunt        50's  . Esophageal cancer Neg Hx   . Stomach cancer Neg Hx     Past Surgical History:  Procedure Laterality Date  . ANTERIOR  CERVICAL DECOMP/DISCECTOMY FUSION N/A 04/07/2016   Procedure: Cervical two-three Anterior cervical decompression/discectomy/fusion;  Surgeon: Ashok Pall, MD;  Location: [facility];  Service: Neurosurgery;  Laterality: N/A;  . BACK SURGERY  2015   lower back  . EXTRACORPOREAL SHOCK WAVE LITHOTRIPSY Left 09/28/2016   Procedure: LEFT EXTRACORPOREAL SHOCK WAVE LITHOTRIPSY (ESWL);  Surgeon: Festus Aloe, MD;  Location: [facility];  Service: Urology;  Laterality: Left;  . EXTRACORPOREAL SHOCK WAVE LITHOTRIPSY Right 04/08/2018   Procedure: EXTRACORPOREAL SHOCK WAVE LITHOTRIPSY (ESWL);  Surgeon: Louis Meckel,  Viona Gilmore, MD;  Location: [facility];  Service: Urology;  Laterality: Right;  . KIDNEY SURGERY    . LITHOTRIPSY  05/2016  . TUBAL LIGATION     Social History   Occupational History  . Occupation: Ship broker  school bus driver  Tobacco Use  . Smoking status: Current Every Day Smoker    Packs/day: 0.50    Years: 20.00    Pack years: 10.00    Types: Cigarettes  . Smokeless tobacco: Never Used  Vaping Use  . Vaping Use: Never used  Substance and Sexual Activity  . Alcohol use: No  . Drug use: No  . Sexual activity: Not Currently    Birth control/protection: Post-menopausal, Surgical    Comment: 1st intercourse 60 yo-Fewer than 5 partners-BTL

## 2020-05-06 ENCOUNTER — Telehealth: Payer: Self-pay | Admitting: Internal Medicine

## 2020-05-06 ENCOUNTER — Other Ambulatory Visit: Payer: Self-pay

## 2020-05-06 NOTE — Telephone Encounter (Signed)
Caller states she is having severe arthritis pain in her back and hip. Saw Dr.Yates yesterday and he said no surgery. Patient can walk but limps. Pain is getting worse over the last 2-3 weeks. Tylenol, Aleve Arthritis is not helping  [facility] has told the patient to go to ED now. Patient understood.

## 2020-05-07 ENCOUNTER — Ambulatory Visit: Payer: BC Managed Care – PPO | Admitting: Family

## 2020-05-07 VITALS — BP 118/86 | HR 80 | Temp 98.3°F | Ht 62.0 in | Wt 155.0 lb

## 2020-05-07 DIAGNOSIS — G8929 Other chronic pain: Secondary | ICD-10-CM

## 2020-05-07 DIAGNOSIS — M545 Low back pain, unspecified: Secondary | ICD-10-CM | POA: Diagnosis not present

## 2020-05-07 NOTE — Progress Notes (Signed)
[name] Walker is a 60 y.o. female with the following history as recorded in EpicCare:  Patient Active Problem List   Diagnosis Date Noted  . Chronic low back pain with left-sided sciatica 03/31/2020  . Lumbar disc disease 03/02/2020  . Left sided sciatica 01/31/2020  . Depression 01/29/2020  . Hematuria 01/29/2020  . Left-sided weakness 10/31/2019  . Balance disorder 10/31/2019  . Difficulty with speech 10/02/2019  . Insomnia 10/02/2019  . HLD (hyperlipidemia) 04/14/2019  . Acute ear pain, bilateral 04/14/2019  . Vitamin D deficiency 04/14/2019  . Anxiety with depression 10/06/2018  . Neck pain 09/10/2018  . Hand swelling 09/10/2018  . Bilateral radiating leg pain 04/23/2018  . Myalgia 04/23/2018  . Right renal stone 04/03/2018  . Smoker 04/29/2016  . Bilateral hand pain 04/29/2016  . HNP (herniated nucleus pulposus), cervical 04/07/2016  . Cervical radiculopathy, acute 01/29/2016  . Acute upper respiratory infection 01/29/2016  . Degenerative disc disease, cervical 01/11/2016  . Hyperglycemia 11/17/2015  . Angioedema 10/12/2015  . Bilateral lower extremity edema 10/12/2015  . Mouth sores 10/12/2015  . Hypokalemia 10/12/2015  . Thoracic scoliosis 10/01/2015  . Peripheral edema 10/01/2015  . Eczema 10/01/2015  . Asthma 10/01/2015  . Breast lump on right side at 4 o'clock position 01/21/2014  . Hot flashes 10/01/2013  . Lumbar radiculopathy 07/17/2012  . Grief reaction 07/17/2012  . Family history of colon cancer 07/02/2012  . OAB (overactive bladder) 03/01/2012  . Encounter for well adult exam with abnormal findings 08/23/2010  . Plantar fasciitis 08/23/2010  . Dysuria 01/05/2010  . UTI (urinary tract infection) 10/19/2008  . ANKLE PAIN, BILATERAL 10/19/2008  . VAGINITIS 09/10/2008  . JOINT EFFUSION, ANKLE 09/10/2008  . SUBACROMIAL BURSITIS, RIGHT 09/01/2008  . ACHILLES TENDINITIS 09/01/2008  . LATERAL EPICONDYLITIS, RIGHT 08/18/2008  . Essential hypertension  07/12/2007  . Allergic rhinitis 07/12/2007  . GERD 07/12/2007  . Chronic low back pain 07/12/2007  . NEPHROLITHIASIS, HX OF 07/12/2007    Current Outpatient Medications  Medication Sig Dispense Refill  . acetaminophen (TYLENOL) 500 MG tablet Take 1,000 mg by mouth every 6 (six) hours as needed for mild pain.    Marland Kitchen albuterol (VENTOLIN HFA) 108 (90 Base) MCG/ACT inhaler Inhale 2 puffs into the lungs every 6 (six) hours as needed for wheezing or shortness of breath. 18 g 0  . amLODipine (NORVASC) 5 MG tablet Take 1 tablet (5 mg total) by mouth daily. 90 tablet 3  . aspirin EC 81 MG tablet Take 1 tablet (81 mg total) by mouth daily. Swallow whole. 30 tablet 11  . atorvastatin (LIPITOR) 10 MG tablet Take 1 tablet (10 mg total) by mouth daily. 90 tablet 3  . cetirizine (ZYRTEC) 10 MG tablet Take 10 mg by mouth daily as needed for allergies.    . citalopram (CELEXA) 20 MG tablet Take 1 tablet (20 mg total) by mouth daily. 90 tablet 3  . famotidine (PEPCID) 40 MG tablet Take 1 tablet (40 mg total) by mouth at bedtime. 30 tablet 11  . glycopyrrolate (ROBINUL) 2 MG tablet Take 1 tablet (2 mg total) by mouth 2 (two) times daily. 60 tablet 11  . lisinopril (ZESTRIL) 10 MG tablet Take by mouth.    Marland Kitchen LORazepam (ATIVAN) 0.5 MG tablet Take 1 tablet by mouth twice daily as needed for anxiety 60 tablet 2  . MYRBETRIQ 25 MG TB24 tablet Take 25 mg by mouth daily.    . nicotine polacrilex (NICORETTE) 4 MG gum Take 1 each (4  mg total) by mouth as needed for smoking cessation. 100 tablet 5  . ondansetron (ZOFRAN) 8 MG tablet Take 1 tablet (8 mg total) by mouth every 8 (eight) hours as needed for nausea or vomiting. 20 tablet 0  . pantoprazole (PROTONIX) 40 MG tablet Take 1 tablet (40 mg total) by mouth 2 (two) times daily before a meal. 60 tablet 11  . traMADol (ULTRAM) 50 MG tablet Take 1 tablet (50 mg total) by mouth every 6 (six) hours as needed. 30 tablet 0  . traZODone (DESYREL) 50 MG tablet Take 1 tablet (50  mg total) by mouth at bedtime as needed for sleep. 90 tablet 0   No current facility-administered medications for this visit.    Allergies: Flexeril [cyclobenzaprine], Lisinopril, Other, Robaxin [methocarbamol], Contrast media [iodinated diagnostic agents], Gabapentin, and Lyrica [pregabalin]  Past Medical History:  Diagnosis Date  . ACHILLES TENDINITIS   . ALLERGIC RHINITIS   . Allergy   . Anginal pain ([place]) 04/04/2016   Pt currently feels like she is having muscle spasms and aching in her chest  . Anxiety   . Asthma   . Chronic kidney disease   . Depression   . GERD   . History of kidney stones   . HYPERTENSION   . LATERAL EPICONDYLITIS, RIGHT   . LOW BACK PAIN   . Plantar fascial fibromatosis   . Stress   . SUBACROMIAL BURSITIS, RIGHT   . Thoracic scoliosis 10/01/2015  . TIA (transient ischemic attack)     Past Surgical History:  Procedure Laterality Date  . ANTERIOR CERVICAL DECOMP/DISCECTOMY FUSION N/A 04/07/2016   Procedure: Cervical two-three Anterior cervical decompression/discectomy/fusion;  Surgeon: Ashok Pall, MD;  Location: [facility];  Service: Neurosurgery;  Laterality: N/A;  . BACK SURGERY  2015   lower back  . EXTRACORPOREAL SHOCK WAVE LITHOTRIPSY Left 09/28/2016   Procedure: LEFT EXTRACORPOREAL SHOCK WAVE LITHOTRIPSY (ESWL);  Surgeon: Festus Aloe, MD;  Location: [facility];  Service: Urology;  Laterality: Left;  . EXTRACORPOREAL SHOCK WAVE LITHOTRIPSY Right 04/08/2018   Procedure: EXTRACORPOREAL SHOCK WAVE LITHOTRIPSY (ESWL);  Surgeon: Ardis Hughs, MD;  Location: [facility];  Service: Urology;  Laterality: Right;  . KIDNEY SURGERY    . LITHOTRIPSY  05/2016  . TUBAL LIGATION      Family History  Problem Relation Age of Onset  . Colon cancer Sister        dx in her 76s  . Diabetes Mother   . Colon cancer Father        dx in his 45s  . Prostate cancer Father   . Breast cancer Maternal Aunt        50's  . Esophageal cancer Neg Hx   . Stomach cancer Neg Hx      Social History   Tobacco Use  . Smoking status: Current Every Day Smoker    Packs/day: 0.50    Years: 20.00    Pack years: 10.00    Types: Cigarettes  . Smokeless tobacco: Never Used  Substance Use Topics  . Alcohol use: No    Subjective:   Chronic low back pain; discussed with her PCP in February and saw orthopedist 2 days ago; not felt to be a surgical candidate; notes that her PCP did refill her Tramadol for her; she notes that orthopedist told her to come back to her PCP to discuss management of her back/ PT orders;   Objective:  Vitals:   05/07/20 1040  BP: 118/86  Pulse: 80  Temp: 98.3 F (36.8 C)  TempSrc: Oral  SpO2: 98%  Weight: 155 lb (70.3 kg)  Height: 5\' 2"  (1.575 m)    General: Well developed, well nourished, in no acute distress  Skin : Warm and dry.  Head: Normocephalic and atraumatic  Lungs: Respirations unlabored;  Musculoskeletal: No deformities; no active joint inflammation  Extremities: No edema, cyanosis, clubbing  Vessels: Symmetric bilaterally  Neurologic: Alert and oriented; speech intact; face symmetrical; moves all extremities well; CNII-XII intact without focal deficit   Assessment:  1. Chronic bilateral low back pain without sciatica     Plan:  She will need to see her PCP to discuss long-term management; she can discuss PT and medication options with her PCP. Reviewed with her that she can use Tylenol and Tramadol as previously discussed; explained to her that she should see her own PCP in the future for discussion about long term management of chronic care needs.  This visit occurred during the SARS-CoV-2 public health emergency.  Safety protocols were in place, including screening questions prior to the visit, additional usage of staff PPE, and extensive cleaning of exam room while observing appropriate contact time as indicated for disinfecting solutions.        Return for Dr. Jenny Reichmann to discuss your continued back pain.  No orders  of the defined types were placed in this encounter.   Requested Prescriptions    No prescriptions requested or ordered in this encounter

## 2020-05-08 ENCOUNTER — Other Ambulatory Visit: Payer: Self-pay | Admitting: Internal Medicine

## 2020-05-10 ENCOUNTER — Encounter: Payer: Self-pay | Admitting: Obstetrics & Gynecology

## 2020-05-10 ENCOUNTER — Other Ambulatory Visit: Payer: Self-pay

## 2020-05-10 ENCOUNTER — Ambulatory Visit: Payer: BC Managed Care – PPO | Admitting: Obstetrics & Gynecology

## 2020-05-10 ENCOUNTER — Telehealth: Payer: Self-pay | Admitting: Internal Medicine

## 2020-05-10 VITALS — BP 134/80

## 2020-05-10 DIAGNOSIS — M810 Age-related osteoporosis without current pathological fracture: Secondary | ICD-10-CM

## 2020-05-10 MED ORDER — ALENDRONATE SODIUM 70 MG PO TABS: 70.0000 mg | ORAL_TABLET | ORAL | 4 refills | Status: DC

## 2020-05-10 NOTE — Progress Notes (Signed)
    [name] Walker 1961/02/13 [id number]        59 y.o.  G3P3L3  RP: Osteoporosis at the Lumbar Spine on BD 03/2020  HPI: Patient has Arthritis at the lower back.  Per patient, received 4 CS injections so far.  Difficulty walking regularly because of lower back pain.  H/O kidney stones, so not taking Ca++ supplements.  Not taking Vit D, will check Vit D level today.  No h/o fragility fracture.   OB History  Gravida Para Term Preterm AB Living  3 3       3   SAB IAB Ectopic Multiple Live Births               # Outcome Date GA Lbr Len/2nd Weight Sex Delivery Anes PTL Lv  3 Para           2 Para           1 Para             Past medical history,surgical history, problem list, medications, allergies, family history and social history were all reviewed and documented in the EPIC chart.   Directed ROS with pertinent positives and negatives documented in the history of present illness/assessment and plan.  Exam:  Vitals:   05/10/20 1641  BP: 134/80   General appearance:  Normal  Bone Density at [facility] on 04/19/2020:  Osteoporosis T-Score -3.2 at the Lumbar Spine.   Assessment/Plan:  60 y.o. G3P3   1. Age-related osteoporosis without current pathological fracture Osteoporosis at the Lumbar Spine with T-Score at -3.2.  Other sites with Osteopenia.  Counseling on Osteoporosis and the risk of Bone fracture from minor falls.  Management of Osteoporosis reviewed with patient.  Decision to start on Fosamax 70 mg PO weekly.  Usage, risks and benefits reviewed.  Prescription sent to pharmacy.  Will check a Vit D level today.  Vit D supplement per results.  Recommend Ca++ 1.2 to 1.5 g/d in diet.  Regular weight bearing physical activities. - Vitamin D 1,25 dihydroxy  Other orders - alendronate (FOSAMAX) 70 MG tablet; Take 1 tablet (70 mg total) by mouth every 7 (seven) days. Take with a full glass of water on an empty stomach.  Princess Bruins MD, 5:01 PM 05/10/2020

## 2020-05-10 NOTE — Telephone Encounter (Signed)
I received monthly Short term disability forms.  Forms have been completed &Placed in providers box to review and sign.

## 2020-05-11 ENCOUNTER — Encounter: Payer: Self-pay | Admitting: Internal Medicine

## 2020-05-11 ENCOUNTER — Ambulatory Visit: Payer: BC Managed Care – PPO | Admitting: Internal Medicine

## 2020-05-11 ENCOUNTER — Encounter: Payer: Self-pay | Admitting: Family

## 2020-05-11 ENCOUNTER — Ambulatory Visit: Payer: BC Managed Care – PPO | Admitting: Family

## 2020-05-11 VITALS — BP 152/92 | HR 97 | Temp 98.0°F | Ht 62.0 in | Wt 156.0 lb

## 2020-05-11 DIAGNOSIS — G8929 Other chronic pain: Secondary | ICD-10-CM | POA: Diagnosis not present

## 2020-05-11 DIAGNOSIS — M545 Low back pain, unspecified: Secondary | ICD-10-CM | POA: Diagnosis not present

## 2020-05-11 DIAGNOSIS — R531 Weakness: Secondary | ICD-10-CM

## 2020-05-11 NOTE — Patient Instructions (Signed)
Please call Dr. Noemi Chapel to ask about getting set up for PT; since you are his patient and he has been managing your back issues, he should be able to do this; if his office does need a referral for PT, then reach out and discuss with Dr. Jenny Reichmann;  Schedule with Dr. Jenny Reichmann for next week;

## 2020-05-11 NOTE — Telephone Encounter (Signed)
[place] to note pt paperwork request

## 2020-05-11 NOTE — Telephone Encounter (Signed)
Forms have been sided, Copy sent to scan.  Patient informed and will pick up form to take to employer.

## 2020-05-11 NOTE — Telephone Encounter (Signed)
Information has been added.

## 2020-05-11 NOTE — Progress Notes (Signed)
[name] Walker is a 60 y.o. female with the following history as recorded in Epic[employer]:  Patient Active Problem List   Diagnosis Date Noted  . Chronic low back pain with left-sided sciatica 03/31/2020  . Lumbar disc disease 03/02/2020  . Left sided sciatica 01/31/2020  . Depression 01/29/2020  . Hematuria 01/29/2020  . Left-sided weakness 10/31/2019  . Balance disorder 10/31/2019  . Difficulty with speech 10/02/2019  . Insomnia 10/02/2019  . HLD (hyperlipidemia) 04/14/2019  . Acute ear pain, bilateral 04/14/2019  . Vitamin D deficiency 04/14/2019  . Anxiety with depression 10/06/2018  . Neck pain 09/10/2018  . Hand swelling 09/10/2018  . Bilateral radiating leg pain 04/23/2018  . Myalgia 04/23/2018  . Right renal stone 04/03/2018  . Smoker 04/29/2016  . Bilateral hand pain 04/29/2016  . HNP (herniated nucleus pulposus), cervical 04/07/2016  . Cervical radiculopathy, acute 01/29/2016  . Acute upper respiratory infection 01/29/2016  . Degenerative disc disease, cervical 01/11/2016  . Hyperglycemia 11/17/2015  . Angioedema 10/12/2015  . Bilateral lower extremity edema 10/12/2015  . Mouth sores 10/12/2015  . Hypokalemia 10/12/2015  . Thoracic scoliosis 10/01/2015  . Peripheral edema 10/01/2015  . Eczema 10/01/2015  . Asthma 10/01/2015  . Breast lump on right side at 4 o'clock position 01/21/2014  . Hot flashes 10/01/2013  . Lumbar radiculopathy 07/17/2012  . Grief reaction 07/17/2012  . Family history of colon cancer 07/02/2012  . OAB (overactive bladder) 03/01/2012  . Encounter for well adult exam with abnormal findings 08/23/2010  . Plantar fasciitis 08/23/2010  . Dysuria 01/05/2010  . UTI (urinary tract infection) 10/19/2008  . ANKLE PAIN, BILATERAL 10/19/2008  . VAGINITIS 09/10/2008  . JOINT EFFUSION, ANKLE 09/10/2008  . SUBACROMIAL BURSITIS, RIGHT 09/01/2008  . ACHILLES TENDINITIS 09/01/2008  . LATERAL EPICONDYLITIS, RIGHT 08/18/2008  . Essential hypertension  07/12/2007  . Allergic rhinitis 07/12/2007  . GERD 07/12/2007  . Chronic low back pain 07/12/2007  . NEPHROLITHIASIS, HX OF 07/12/2007    Current Outpatient Medications  Medication Sig Dispense Refill  . albuterol (VENTOLIN HFA) 108 (90 Base) MCG/ACT inhaler Inhale 2 puffs into the lungs every 6 (six) hours as needed for wheezing or shortness of breath. 18 g 0  . amLODipine (NORVASC) 5 MG tablet Take 1 tablet (5 mg total) by mouth daily. 90 tablet 3  . aspirin EC 81 MG tablet Take 1 tablet (81 mg total) by mouth daily. Swallow whole. 30 tablet 11  . atorvastatin (LIPITOR) 10 MG tablet Take 1 tablet (10 mg total) by mouth daily. 90 tablet 3  . cetirizine (ZYRTEC) 10 MG tablet Take 10 mg by mouth daily as needed for allergies.    . citalopram (CELEXA) 20 MG tablet Take 1 tablet (20 mg total) by mouth daily. 90 tablet 3  . glycopyrrolate (ROBINUL) 2 MG tablet Take 1 tablet (2 mg total) by mouth 2 (two) times daily. 60 tablet 11  . lisinopril (ZESTRIL) 10 MG tablet Take by mouth.    Marland Kitchen LORazepam (ATIVAN) 0.5 MG tablet Take 1 tablet by mouth twice daily as needed for anxiety 60 tablet 2  . MYRBETRIQ 25 MG TB24 tablet Take 25 mg by mouth daily.    . ondansetron (ZOFRAN) 8 MG tablet Take 1 tablet (8 mg total) by mouth every 8 (eight) hours as needed for nausea or vomiting. 20 tablet 0  . pantoprazole (PROTONIX) 40 MG tablet Take 1 tablet (40 mg total) by mouth 2 (two) times daily before a meal. 60 tablet 11  .  traZODone (DESYREL) 50 MG tablet TAKE 1 TABLET BY MOUTH AT BEDTIME AS NEEDED FOR SLEEP 90 tablet 1  . acetaminophen (TYLENOL) 500 MG tablet Take 1,000 mg by mouth every 6 (six) hours as needed for mild pain. (Patient not taking: Reported on 05/11/2020)    . famotidine (PEPCID) 40 MG tablet Take 1 tablet (40 mg total) by mouth at bedtime. (Patient not taking: Reported on 05/11/2020) 30 tablet 11  . nicotine polacrilex (NICORETTE) 4 MG gum Take 1 each (4 mg total) by mouth as needed for smoking  cessation. (Patient not taking: Reported on 05/11/2020) 100 tablet 5  . traMADol (ULTRAM) 50 MG tablet Take 1 tablet (50 mg total) by mouth every 6 (six) hours as needed. (Patient not taking: Reported on 05/11/2020) 30 tablet 0   No current facility-administered medications for this visit.    Allergies: Flexeril [cyclobenzaprine], Lisinopril, Other, Robaxin [methocarbamol], Contrast media [iodinated diagnostic agents], Gabapentin, and Lyrica [pregabalin]  Past Medical History:  Diagnosis Date  . ACHILLES TENDINITIS   . ALLERGIC RHINITIS   . Allergy   . Anginal pain ([place]) 04/04/2016   Pt currently feels like she is having muscle spasms and aching in her chest  . Anxiety   . Asthma   . Chronic kidney disease   . Depression   . GERD   . History of kidney stones   . HYPERTENSION   . LATERAL EPICONDYLITIS, RIGHT   . LOW BACK PAIN   . Plantar fascial fibromatosis   . Stress   . SUBACROMIAL BURSITIS, RIGHT   . Thoracic scoliosis 10/01/2015  . TIA (transient ischemic attack)     Past Surgical History:  Procedure Laterality Date  . ANTERIOR CERVICAL DECOMP/DISCECTOMY FUSION N/A 04/07/2016   Procedure: Cervical two-three Anterior cervical decompression/discectomy/fusion;  Surgeon: Ashok Pall, MD;  Location: [facility];  Service: Neurosurgery;  Laterality: N/A;  . BACK SURGERY  2015   lower back  . EXTRACORPOREAL SHOCK WAVE LITHOTRIPSY Left 09/28/2016   Procedure: LEFT EXTRACORPOREAL SHOCK WAVE LITHOTRIPSY (ESWL);  Surgeon: Festus Aloe, MD;  Location: [facility];  Service: Urology;  Laterality: Left;  . EXTRACORPOREAL SHOCK WAVE LITHOTRIPSY Right 04/08/2018   Procedure: EXTRACORPOREAL SHOCK WAVE LITHOTRIPSY (ESWL);  Surgeon: Ardis Hughs, MD;  Location: [facility];  Service: Urology;  Laterality: Right;  . KIDNEY SURGERY    . LITHOTRIPSY  05/2016  . TUBAL LIGATION      Family History  Problem Relation Age of Onset  . Colon cancer Sister        dx in her 73s  . Diabetes Mother   . Colon  cancer Father        dx in his 91s  . Prostate cancer Father   . Breast cancer Maternal Aunt        50's  . Esophageal cancer Neg Hx   . Stomach cancer Neg Hx     Social History   Tobacco Use  . Smoking status: Current Every Day Smoker    Packs/day: 0.50    Years: 20.00    Pack years: 10.00    Types: Cigarettes  . Smokeless tobacco: Never Used  Substance Use Topics  . Alcohol use: No    Subjective:   Patient is seen again to discuss her chronic low back pain; she had an appointment with her PCP today for routine follow-up but unfortunately missed that appointment and was re-scheduled to see me today; I just saw the patient for these complaints last Friday; She indicated at  that time she was under the care of Dr. Noemi Chapel at [facility] for her back- had seen Dr. Lorin Mercy on 3/9 for 2nd opinion; her PCP is managing the Tramadol she takes for pain. She has been asked to start PT for her back and wants to have it done through [employer] [facility] [employer]; she has not contacted them for follow-up or to discuss how to get this scheduled.    Objective:  Vitals:   05/11/20 1008  BP: (!) 152/92  Pulse: 97  Temp: 98 F (36.7 C)  TempSrc: Oral  SpO2: 98%  Weight: 156 lb (70.8 kg)  Height: 5\' 2"  (1.575 m)    General: Well developed, well nourished, in no acute distress  Skin : Warm and dry.  Head: Normocephalic and atraumatic  Lungs: Respirations unlabored;  Musculoskeletal: No deformities; no active joint inflammation  Extremities: No edema, cyanosis, clubbing  Vessels: Symmetric bilaterally  Neurologic: Alert and oriented; speech intact; face symmetrical; moves all extremities well; CNII-XII intact without focal deficit   Assessment:  1. Chronic bilateral low back pain without sciatica     Plan:  This is not a new issue for this patient- she has a specialist who is managing her care and she wants that office to order PT for her; she will reach out to Dr. Noemi Chapel about starting PT at their  office. She needs to see her PCP in follow-up for medication follow-up.  This visit occurred during the SARS-CoV-2 public health emergency.  Safety protocols were in place, including screening questions prior to the visit, additional usage of staff PPE, and extensive cleaning of exam room while observing appropriate contact time as indicated for disinfecting solutions.     No follow-ups on file.  No orders of the defined types were placed in this encounter.   Requested Prescriptions    No prescriptions requested or ordered in this encounter

## 2020-05-14 ENCOUNTER — Telehealth: Payer: Self-pay | Admitting: Internal Medicine

## 2020-05-14 NOTE — Telephone Encounter (Signed)
Pharmacy notified tramadol is for chronic back pain

## 2020-05-14 NOTE — Telephone Encounter (Signed)
   Pharmacy calling to verify reason patient is taking traMADol (ULTRAM) 50 MG tablet  Also has question regarding prior auth .  Ask for April -  [place], [place] - [place] Phone:  [phone]

## 2020-05-15 LAB — VITAMIN D 1,25 DIHYDROXY
Vitamin D 1, 25 (OH)2 Total: 88 pg/mL — ABNORMAL HIGH (ref 18–72)
Vitamin D2 1, 25 (OH)2: 32 pg/mL
Vitamin D3 1, 25 (OH)2: 56 pg/mL

## 2020-05-17 ENCOUNTER — Ambulatory Visit: Payer: BC Managed Care – PPO | Admitting: Internal Medicine

## 2020-05-17 ENCOUNTER — Other Ambulatory Visit: Payer: Self-pay

## 2020-05-19 ENCOUNTER — Ambulatory Visit: Payer: BC Managed Care – PPO | Admitting: Internal Medicine

## 2020-05-19 DIAGNOSIS — Z0289 Encounter for other administrative examinations: Secondary | ICD-10-CM

## 2020-05-22 ENCOUNTER — Other Ambulatory Visit: Payer: Self-pay

## 2020-05-24 ENCOUNTER — Encounter: Payer: Self-pay | Admitting: Internal Medicine

## 2020-05-24 ENCOUNTER — Other Ambulatory Visit: Payer: Self-pay

## 2020-05-24 ENCOUNTER — Ambulatory Visit (INDEPENDENT_AMBULATORY_CARE_PROVIDER_SITE_OTHER): Payer: BC Managed Care – PPO | Admitting: Internal Medicine

## 2020-05-24 VITALS — BP 140/90 | HR 70 | Temp 98.3°F | Wt 153.8 lb

## 2020-05-24 DIAGNOSIS — R739 Hyperglycemia, unspecified: Secondary | ICD-10-CM

## 2020-05-24 DIAGNOSIS — M5442 Lumbago with sciatica, left side: Secondary | ICD-10-CM

## 2020-05-24 DIAGNOSIS — G8929 Other chronic pain: Secondary | ICD-10-CM

## 2020-05-24 DIAGNOSIS — J45909 Unspecified asthma, uncomplicated: Secondary | ICD-10-CM | POA: Diagnosis not present

## 2020-05-24 MED ORDER — TRAMADOL HCL 50 MG PO TABS: 50.0000 mg | ORAL_TABLET | Freq: Four times a day (QID) | ORAL | 0 refills | Status: DC | PRN

## 2020-05-24 MED ORDER — ALBUTEROL SULFATE HFA 108 (90 BASE) MCG/ACT IN AERS: 2.0000 | INHALATION_SPRAY | Freq: Four times a day (QID) | RESPIRATORY_TRACT | 11 refills | Status: DC | PRN

## 2020-05-24 NOTE — Progress Notes (Signed)
Patient ID: [name] Walker, female   DOB: December 07, 1960, 60 y.o.   MRN: [id number]        Chief Complaint: low back pain       HPI:  [name] Walker is a 60 y.o. female here with c/o ongoing low back pain for 2 yrs, followed per Dr Sharmaine Base and yates for DJD and not recommended for surgury, has PT to start tomorrow, some better with alleve arthritis but overall not controlled still has pain 7/10 most days of the past 2 wks, cant even do her housework such as vacuuming.  Pt denies bowel or bladder change, fever, wt loss,  worsening LE pain/numbness/weakness, gait change or falls  Has also seen GI - reflux improved.  Also had DXA with GYN with significant bone loss, now to start bisphosphonate.  Pt denies chest pain, increased sob or doe, wheezing, orthopnea, PND, increased LE swelling, palpitations, dizziness or syncope, but does ask for inhaler refill for ongoing control.  Pt denies chest pain, increased sob or doe, wheezing, orthopnea, PND, increased LE swelling, palpitations, dizziness or syncope.   Pt denies polydipsia, polyuria  Still c/o "memory loss" felt to be psychiatric related per neuro.   Pt denies fever, wt loss, night sweats, loss of appetite, or other constitutional symptoms  Still smoking not ready to quit.    Wt Readings from Last 3 Encounters:  05/24/20 153 lb 12.8 oz (69.8 kg)  05/11/20 156 lb (70.8 kg)  05/07/20 155 lb (70.3 kg)   BP Readings from Last 3 Encounters:  05/24/20 140/90  05/11/20 (!) 152/92  05/10/20 134/80         Past Medical History:  Diagnosis Date  . ACHILLES TENDINITIS   . ALLERGIC RHINITIS   . Allergy   . Anginal pain ([place]) 04/04/2016   Pt currently feels like she is having muscle spasms and aching in her chest  . Anxiety   . Asthma   . Chronic kidney disease   . Depression   . GERD   . History of kidney stones   . HYPERTENSION   . LATERAL EPICONDYLITIS, RIGHT   . LOW BACK PAIN   . Plantar fascial fibromatosis   . Stress   . SUBACROMIAL  BURSITIS, RIGHT   . Thoracic scoliosis 10/01/2015  . TIA (transient ischemic attack)    Past Surgical History:  Procedure Laterality Date  . ANTERIOR CERVICAL DECOMP/DISCECTOMY FUSION N/A 04/07/2016   Procedure: Cervical two-three Anterior cervical decompression/discectomy/fusion;  Surgeon: Ashok Pall, MD;  Location: [facility];  Service: Neurosurgery;  Laterality: N/A;  . BACK SURGERY  2015   lower back  . EXTRACORPOREAL SHOCK WAVE LITHOTRIPSY Left 09/28/2016   Procedure: LEFT EXTRACORPOREAL SHOCK WAVE LITHOTRIPSY (ESWL);  Surgeon: Festus Aloe, MD;  Location[facility];  Service: Urology;  Laterality: Left;  . EXTRACORPOREAL SHOCK WAVE LITHOTRIPSY Right 04/08/2018   Procedure: EXTRACORPOREAL SHOCK WAVE LITHOTRIPSY (ESWL);  Surgeon: Ardis Hughs, MD;  Location[facility];  Service: Urology;  Laterality: Right;  . KIDNEY SURGERY    . LITHOTRIPSY  05/2016  . TUBAL LIGATION      reports that she has been smoking cigarettes. She has a 10.00 pack-year smoking history. She has never used smokeless tobacco. She reports that she does not drink alcohol and does not use drugs. family history includes Breast cancer in her maternal aunt; Colon cancer in her father and sister; Diabetes in her mother; Prostate cancer in her father. Allergies  Allergen Reactions  . Flexeril [Cyclobenzaprine] Swelling  Tongue swells  . Lisinopril Other (See Comments)    ? Possible tongue swelling   . Other Palpitations    ALL MUSCLE RELAXERS-PER PATIENT  . Robaxin [Methocarbamol] Other (See Comments)    Tongue swelling  . Contrast Media [Iodinated Diagnostic Agents] Itching  . Gabapentin Other (See Comments)  . Lyrica [Pregabalin]     Anxiety attacks   Current Outpatient Medications on File Prior to Visit  Medication Sig Dispense Refill  . acetaminophen (TYLENOL) 500 MG tablet Take 1,000 mg by mouth every 6 (six) hours as needed for mild pain.    Marland Kitchen amLODipine (NORVASC) 5 MG tablet Take 1 tablet (5 mg total) by  mouth daily. 90 tablet 3  . aspirin EC 81 MG tablet Take 1 tablet (81 mg total) by mouth daily. Swallow whole. 30 tablet 11  . atorvastatin (LIPITOR) 10 MG tablet Take 1 tablet (10 mg total) by mouth daily. 90 tablet 3  . cetirizine (ZYRTEC) 10 MG tablet Take 10 mg by mouth daily as needed for allergies.    . citalopram (CELEXA) 20 MG tablet Take 1 tablet (20 mg total) by mouth daily. 90 tablet 3  . lisinopril (ZESTRIL) 10 MG tablet Take by mouth.    Marland Kitchen LORazepam (ATIVAN) 0.5 MG tablet Take 1 tablet by mouth twice daily as needed for anxiety 60 tablet 2  . MYRBETRIQ 25 MG TB24 tablet Take 25 mg by mouth daily.    . nicotine polacrilex (NICORETTE) 4 MG gum Take 1 each (4 mg total) by mouth as needed for smoking cessation. 100 tablet 5  . ondansetron (ZOFRAN) 8 MG tablet Take 1 tablet (8 mg total) by mouth every 8 (eight) hours as needed for nausea or vomiting. 20 tablet 0  . pantoprazole (PROTONIX) 40 MG tablet Take 1 tablet (40 mg total) by mouth 2 (two) times daily before a meal. 60 tablet 11  . traZODone (DESYREL) 50 MG tablet TAKE 1 TABLET BY MOUTH AT BEDTIME AS NEEDED FOR SLEEP 90 tablet 1  . famotidine (PEPCID) 40 MG tablet Take 1 tablet (40 mg total) by mouth at bedtime. (Patient not taking: Reported on 05/24/2020) 30 tablet 11  . glycopyrrolate (ROBINUL) 2 MG tablet Take 1 tablet (2 mg total) by mouth 2 (two) times daily. (Patient not taking: Reported on 05/24/2020) 60 tablet 11   No current facility-administered medications on file prior to visit.        ROS:  All others reviewed and negative.  Objective        PE:  BP 140/90 (BP Location: Right Arm, Patient Position: Sitting, Cuff Size: Normal)   Pulse 70   Temp 98.3 F (36.8 C) (Oral)   Wt 153 lb 12.8 oz (69.8 kg)   LMP 08/22/2010   SpO2 99%   BMI 28.13 kg/m                 Constitutional: Pt appears in NAD               HENT: Head: NCAT.                Right Ear: External ear normal.                 Left Ear: External ear  normal.                Eyes: . Pupils are equal, round, and reactive to light. Conjunctivae and EOM are normal  Nose: without d/c or deformity               Neck: Neck supple. Walker normal ROM               Cardiovascular: Normal rate and regular rhythm.                 Pulmonary/Chest: Effort normal and breath sounds without rales or wheezing.                Abd:  Soft, NT, ND, + BS, no organomegaly               Neurological: Pt is alert. At baseline orientation, motor grossly intact               Skin: Skin is warm. No rashes, no other new lesions, LE edema - none               Psychiatric: Pt behavior is normal without agitation   Micro: none  Cardiac tracings I have personally interpreted today:  none  Pertinent Radiological findings (summarize): none   Lab Results  Component Value Date   WBC 4.3 02/24/2020   HGB 14.3 02/24/2020   HCT 45.1 02/24/2020   PLT 158 02/24/2020   GLUCOSE 113 (H) 02/24/2020   CHOL 195 10/03/2018   TRIG 200.0 (H) 10/03/2018   HDL 39.50 10/03/2018   LDLDIRECT 117.0 09/06/2017   LDLCALC 116 (H) 10/03/2018   ALT 24 03/24/2020   AST 21 03/24/2020   NA 140 02/24/2020   K 3.5 02/24/2020   CL 109 02/24/2020   CREATININE 0.60 02/24/2020   BUN 10 02/24/2020   CO2 23 02/24/2020   TSH 0.47 09/23/2019   INR 0.9 09/30/2019   HGBA1C 5.3 09/23/2019   Assessment/Plan:  [name] Walker is a 60 y.o. Black or African American [2] female with  has a past medical history of ACHILLES TENDINITIS, ALLERGIC RHINITIS, Allergy, Anginal pain ([place]) (04/04/2016), Anxiety, Asthma, Chronic kidney disease, Depression, GERD, History of kidney stones, HYPERTENSION, LATERAL EPICONDYLITIS, RIGHT, LOW BACK PAIN, Plantar fascial fibromatosis, Stress, SUBACROMIAL BURSITIS, RIGHT, Thoracic scoliosis (10/01/2015), and TIA (transient ischemic attack).  Chronic low back pain with left-sided sciatica Ok for limited prn tramadol ,  to f/u any worsening symptoms or  concerns  Hyperglycemia Lab Results  Component Value Date   HGBA1C 5.3 09/23/2019   Stable, pt to continue current medical treatment  - diet   Asthma Stable, for inhaler refill,  to f/u any worsening symptoms or concerns  Followup: Return in about 3 months (around 08/24/2020).  Cathlean Cower, MD 06/02/2020 9:03 PM [facility]

## 2020-05-24 NOTE — Patient Instructions (Signed)
Please continue all other medications as before, and refills have been done if requested - the inhaler and the pain medication  Please have the pharmacy call with any other refills you may need.  Please continue your efforts at being more active, low cholesterol diet, and weight control.  Please keep your appointments with your specialists as you may have planned  Please make an Appointment to return in 3 months, or sooner if needed

## 2020-06-02 ENCOUNTER — Encounter: Payer: Self-pay | Admitting: Internal Medicine

## 2020-06-02 NOTE — Assessment & Plan Note (Signed)
[place] for limited prn tramadol ,  to f/u any worsening symptoms or concerns

## 2020-06-02 NOTE — Assessment & Plan Note (Signed)
Stable, for inhaler refill,  to f/u any worsening symptoms or concerns

## 2020-06-02 NOTE — Assessment & Plan Note (Signed)
Lab Results  Component Value Date   HGBA1C 5.3 09/23/2019   Stable, pt to continue current medical treatment  - diet

## 2020-06-17 ENCOUNTER — Other Ambulatory Visit: Payer: Self-pay

## 2020-06-17 DIAGNOSIS — R7989 Other specified abnormal findings of blood chemistry: Secondary | ICD-10-CM

## 2020-06-18 ENCOUNTER — Telehealth: Payer: Self-pay | Admitting: Internal Medicine

## 2020-06-18 NOTE — Telephone Encounter (Signed)
I received monthly renewal short term disability forms.   Forms have been completed and Placed in providers box to review and sign.

## 2020-06-22 NOTE — Telephone Encounter (Signed)
Forms have been signed, Faxed, Copy sent to scan.   LVM to inform patient the original is ready to be picked up. I can mail if she would like.

## 2020-06-29 ENCOUNTER — Other Ambulatory Visit: Payer: Self-pay

## 2020-06-30 ENCOUNTER — Encounter: Payer: Self-pay | Admitting: Internal Medicine

## 2020-06-30 ENCOUNTER — Ambulatory Visit: Payer: BC Managed Care – PPO | Admitting: Internal Medicine

## 2020-06-30 ENCOUNTER — Other Ambulatory Visit: Payer: Self-pay | Admitting: Internal Medicine

## 2020-06-30 VITALS — BP 138/90 | HR 72 | Temp 98.5°F | Ht 62.0 in | Wt 153.0 lb

## 2020-06-30 DIAGNOSIS — E559 Vitamin D deficiency, unspecified: Secondary | ICD-10-CM

## 2020-06-30 DIAGNOSIS — R739 Hyperglycemia, unspecified: Secondary | ICD-10-CM | POA: Diagnosis not present

## 2020-06-30 DIAGNOSIS — Z0001 Encounter for general adult medical examination with abnormal findings: Secondary | ICD-10-CM | POA: Diagnosis not present

## 2020-06-30 DIAGNOSIS — E538 Deficiency of other specified B group vitamins: Secondary | ICD-10-CM

## 2020-06-30 DIAGNOSIS — G8929 Other chronic pain: Secondary | ICD-10-CM

## 2020-06-30 DIAGNOSIS — M5442 Lumbago with sciatica, left side: Secondary | ICD-10-CM | POA: Diagnosis not present

## 2020-06-30 DIAGNOSIS — E7849 Other hyperlipidemia: Secondary | ICD-10-CM | POA: Diagnosis not present

## 2020-06-30 DIAGNOSIS — F17200 Nicotine dependence, unspecified, uncomplicated: Secondary | ICD-10-CM | POA: Diagnosis not present

## 2020-06-30 LAB — URINALYSIS, ROUTINE W REFLEX MICROSCOPIC
Bilirubin Urine: NEGATIVE
Hgb urine dipstick: NEGATIVE
Ketones, ur: NEGATIVE
Leukocytes,Ua: NEGATIVE
Nitrite: NEGATIVE
RBC / HPF: NONE SEEN (ref 0–?)
Specific Gravity, Urine: 1.015 (ref 1.000–1.030)
Total Protein, Urine: NEGATIVE
Urine Glucose: NEGATIVE
Urobilinogen, UA: 0.2 (ref 0.0–1.0)
pH: 7.5 (ref 5.0–8.0)

## 2020-06-30 LAB — HEPATIC FUNCTION PANEL
ALT: 22 U/L (ref 0–35)
AST: 19 U/L (ref 0–37)
Albumin: 4.3 g/dL (ref 3.5–5.2)
Alkaline Phosphatase: 117 U/L (ref 39–117)
Bilirubin, Direct: 0.1 mg/dL (ref 0.0–0.3)
Total Bilirubin: 0.5 mg/dL (ref 0.2–1.2)
Total Protein: 6.9 g/dL (ref 6.0–8.3)

## 2020-06-30 LAB — CBC WITH DIFFERENTIAL/PLATELET
Basophils Absolute: 0 10*3/uL (ref 0.0–0.1)
Basophils Relative: 0.7 % (ref 0.0–3.0)
Eosinophils Absolute: 0.1 10*3/uL (ref 0.0–0.7)
Eosinophils Relative: 1.3 % (ref 0.0–5.0)
HCT: 43.9 % (ref 36.0–46.0)
Hemoglobin: 14.3 g/dL (ref 12.0–15.0)
Lymphocytes Relative: 39.3 % (ref 12.0–46.0)
Lymphs Abs: 1.9 10*3/uL (ref 0.7–4.0)
MCHC: 32.5 g/dL (ref 30.0–36.0)
MCV: 88.5 fl (ref 78.0–100.0)
Monocytes Absolute: 0.5 10*3/uL (ref 0.1–1.0)
Monocytes Relative: 9.4 % (ref 3.0–12.0)
Neutro Abs: 2.4 10*3/uL (ref 1.4–7.7)
Neutrophils Relative %: 49.3 % (ref 43.0–77.0)
Platelets: 135 10*3/uL — ABNORMAL LOW (ref 150.0–400.0)
RBC: 4.96 Mil/uL (ref 3.87–5.11)
RDW: 14.5 % (ref 11.5–15.5)
WBC: 4.9 10*3/uL (ref 4.0–10.5)

## 2020-06-30 LAB — LIPID PANEL
Cholesterol: 122 mg/dL (ref 0–200)
HDL: 36.5 mg/dL — ABNORMAL LOW (ref >39.00)
LDL Cholesterol: 46 mg/dL (ref 0–99)
NonHDL: 85.67
Total CHOL/HDL Ratio: 3
Triglycerides: 199 mg/dL — ABNORMAL HIGH (ref 0.0–149.0)
VLDL: 39.8 mg/dL (ref 0.0–40.0)

## 2020-06-30 LAB — BASIC METABOLIC PANEL
BUN: 9 mg/dL (ref 6–23)
CO2: 27 mEq/L (ref 19–32)
Calcium: 9 mg/dL (ref 8.4–10.5)
Chloride: 110 mEq/L (ref 96–112)
Creatinine, Ser: 0.59 mg/dL (ref 0.40–1.20)
GFR: 97.9 mL/min (ref >60.00)
Glucose, Bld: 91 mg/dL (ref 70–99)
Potassium: 3.8 mEq/L (ref 3.5–5.1)
Sodium: 143 mEq/L (ref 135–145)

## 2020-06-30 LAB — VITAMIN B12: Vitamin B-12: 299 pg/mL (ref 211–911)

## 2020-06-30 LAB — HEMOGLOBIN A1C: Hgb A1c MFr Bld: 5.3 % (ref 4.6–6.5)

## 2020-06-30 LAB — VITAMIN D 25 HYDROXY (VIT D DEFICIENCY, FRACTURES): VITD: 20.29 ng/mL — ABNORMAL LOW (ref 30.00–100.00)

## 2020-06-30 LAB — TSH: TSH: 0.53 u[IU]/mL (ref 0.35–4.50)

## 2020-06-30 MED ORDER — PREDNISONE 10 MG PO TABS: ORAL_TABLET | ORAL | 0 refills | Status: DC

## 2020-06-30 MED ORDER — ALENDRONATE SODIUM 70 MG PO TABS: 70.0000 mg | ORAL_TABLET | ORAL | 11 refills | Status: DC

## 2020-06-30 MED ORDER — THERA-D 2000 50 MCG (2000 UT) PO TABS: ORAL_TABLET | ORAL | 99 refills | Status: AC

## 2020-06-30 NOTE — Progress Notes (Signed)
Patient ID: [name] Walker, female   DOB: May 12, 1960, 60 y.o.   MRN: [id number]         Chief Complaint:: wellness exam and Follow-up (3 month f/u)  acute on chronic lbp, low vit d, smoker, hyperglycemia, hld       HPI:  [name] Walker is a 60 y.o. female here for wellness exam; up to date with preventive referrals, and immunizations                        Also has seen GYN with osteoporosis recently and started fosamax.  Pt continues to have recurring LBP without change in severity, bowel or bladder change, fever, wt loss,  worsening LE pain/numbness/weakness, gait change or falls, except now with flare or typical left > right pain with some left upper leg weakness, asking for predpac as this has helped in the past.  Plans to also f/u with Dr Rise Patience who has been following.  Pt denies chest pain, increased sob or doe, wheezing, orthopnea, PND, increased LE swelling, palpitations, dizziness or syncope.   Pt denies polydipsia, polyuria, or new focal neuro s/s.   Pt denies fever, wt loss, night sweats, loss of appetite, or other constitutional symptoms    Wt Readings from Last 3 Encounters:  06/30/20 153 lb (69.4 kg)  05/24/20 153 lb 12.8 oz (69.8 kg)  05/11/20 156 lb (70.8 kg)   BP Readings from Last 3 Encounters:  06/30/20 138/90  05/24/20 140/90  05/11/20 (!) 152/92   Immunization History  Administered Date(s) Administered  . Influenza, Quadrivalent, Recombinant, Inj, Pf 12/31/2018  . Influenza,inj,Quad PF,6+ Mos 03/02/2020  . PFIZER(Purple Top)SARS-COV-2 Vaccination 04/26/2019, 05/17/2019  . Pneumococcal Polysaccharide-23 01/20/2019  . Tdap 11/02/2019  . Zoster Recombinat (Shingrix) 01/20/2019  There are no preventive care reminders to display for this patient.    Past Medical History:  Diagnosis Date  . ACHILLES TENDINITIS   . ALLERGIC RHINITIS   . Allergy   . Anginal pain ([place]) 04/04/2016   Pt currently feels like she is having muscle spasms and aching in her chest  .  Anxiety   . Asthma   . Chronic kidney disease   . Depression   . GERD   . History of kidney stones   . HYPERTENSION   . LATERAL EPICONDYLITIS, RIGHT   . LOW BACK PAIN   . Plantar fascial fibromatosis   . Stress   . SUBACROMIAL BURSITIS, RIGHT   . Thoracic scoliosis 10/01/2015  . TIA (transient ischemic attack)    Past Surgical History:  Procedure Laterality Date  . ANTERIOR CERVICAL DECOMP/DISCECTOMY FUSION N/A 04/07/2016   Procedure: Cervical two-three Anterior cervical decompression/discectomy/fusion;  Surgeon: Ashok Pall, MD;  Location: [facility];  Service: Neurosurgery;  Laterality: N/A;  . BACK SURGERY  2015   lower back  . EXTRACORPOREAL SHOCK WAVE LITHOTRIPSY Left 09/28/2016   Procedure: LEFT EXTRACORPOREAL SHOCK WAVE LITHOTRIPSY (ESWL);  Surgeon: Festus Aloe, MD;  Location: [facility];  Service: Urology;  Laterality: Left;  . EXTRACORPOREAL SHOCK WAVE LITHOTRIPSY Right 04/08/2018   Procedure: EXTRACORPOREAL SHOCK WAVE LITHOTRIPSY (ESWL);  Surgeon: Ardis Hughs, MD;  Location: [facility];  Service: Urology;  Laterality: Right;  . KIDNEY SURGERY    . LITHOTRIPSY  05/2016  . TUBAL LIGATION      reports that she has been smoking cigarettes. She has a 10.00 pack-year smoking history. She has never used smokeless tobacco. She reports that she does not drink alcohol and  does not use drugs. family history includes Breast cancer in her maternal aunt; Colon cancer in her father and sister; Diabetes in her mother; Prostate cancer in her father. Allergies  Allergen Reactions  . Flexeril [Cyclobenzaprine] Swelling    Tongue swells  . Lisinopril Other (See Comments)    ? Possible tongue swelling   . Other Palpitations    ALL MUSCLE RELAXERS-PER PATIENT  . Robaxin [Methocarbamol] Other (See Comments)    Tongue swelling  . Contrast Media [Iodinated Diagnostic Agents] Itching  . Gabapentin Other (See Comments)  . Lyrica [Pregabalin]     Anxiety attacks   Current Outpatient  Medications on File Prior to Visit  Medication Sig Dispense Refill  . acetaminophen (TYLENOL) 500 MG tablet Take 1,000 mg by mouth every 6 (six) hours as needed for mild pain.    Marland Kitchen albuterol (VENTOLIN HFA) 108 (90 Base) MCG/ACT inhaler Inhale 2 puffs into the lungs every 6 (six) hours as needed for wheezing or shortness of breath. 18 g 11  . amLODipine (NORVASC) 5 MG tablet Take 1 tablet (5 mg total) by mouth daily. 90 tablet 3  . aspirin EC 81 MG tablet Take 1 tablet (81 mg total) by mouth daily. Swallow whole. 30 tablet 11  . atorvastatin (LIPITOR) 10 MG tablet Take 1 tablet (10 mg total) by mouth daily. 90 tablet 3  . cetirizine (ZYRTEC) 10 MG tablet Take 10 mg by mouth daily as needed for allergies.    . citalopram (CELEXA) 20 MG tablet Take 1 tablet (20 mg total) by mouth daily. 90 tablet 3  . famotidine (PEPCID) 40 MG tablet Take 1 tablet (40 mg total) by mouth at bedtime. 30 tablet 11  . lisinopril (ZESTRIL) 10 MG tablet Take by mouth.    Marland Kitchen LORazepam (ATIVAN) 0.5 MG tablet Take 1 tablet by mouth twice daily as needed for anxiety 60 tablet 2  . MYRBETRIQ 25 MG TB24 tablet Take 25 mg by mouth daily.    . nicotine polacrilex (NICORETTE) 4 MG gum Take 1 each (4 mg total) by mouth as needed for smoking cessation. 100 tablet 5  . ondansetron (ZOFRAN) 8 MG tablet Take 1 tablet (8 mg total) by mouth every 8 (eight) hours as needed for nausea or vomiting. 20 tablet 0  . pantoprazole (PROTONIX) 40 MG tablet Take 1 tablet (40 mg total) by mouth 2 (two) times daily before a meal. 60 tablet 11  . traMADol (ULTRAM) 50 MG tablet Take 1 tablet (50 mg total) by mouth every 6 (six) hours as needed. 30 tablet 0  . traZODone (DESYREL) 50 MG tablet TAKE 1 TABLET BY MOUTH AT BEDTIME AS NEEDED FOR SLEEP 90 tablet 1  . glycopyrrolate (ROBINUL) 2 MG tablet Take 1 tablet (2 mg total) by mouth 2 (two) times daily. (Patient not taking: Reported on 06/30/2020) 60 tablet 11   No current facility-administered medications  on file prior to visit.        ROS:  All others reviewed and negative.  Objective        PE:  BP 138/90 (BP Location: Left Arm, Patient Position: Sitting, Cuff Size: Large)   Pulse 72   Temp 98.5 F (36.9 C) (Oral)   Ht 5\' 2"  (1.575 m)   Wt 153 lb (69.4 kg)   LMP 08/22/2010   SpO2 99%   BMI 27.98 kg/m                 Constitutional: Pt appears in NAD  HENT: Head: NCAT.                Right Ear: External ear normal.                 Left Ear: External ear normal.                Eyes: . Pupils are equal, round, and reactive to light. Conjunctivae and EOM are normal               Nose: without d/c or deformity               Neck: Neck supple. Gross normal ROM               Cardiovascular: Normal rate and regular rhythm.                 Pulmonary/Chest: Effort normal and breath sounds without rales or wheezing.                Abd:  Soft, NT, ND, + BS, no organomegaly               Neurological: Pt is alert. At baseline orientation, motor grossly intact               Skin: Skin is warm. No rashes, no other new lesions, LE edema - none               Psychiatric: Pt behavior is normal without agitation   Micro: none  Cardiac tracings I have personally interpreted today:  none  Pertinent Radiological findings (summarize): none   Lab Results  Component Value Date   WBC 4.9 06/30/2020   HGB 14.3 06/30/2020   HCT 43.9 06/30/2020   PLT 135.0 (L) 06/30/2020   GLUCOSE 91 06/30/2020   CHOL 122 06/30/2020   TRIG 199.0 (H) 06/30/2020   HDL 36.50 (L) 06/30/2020   LDLDIRECT 117.0 09/06/2017   LDLCALC 46 06/30/2020   ALT 22 06/30/2020   AST 19 06/30/2020   NA 143 06/30/2020   K 3.8 06/30/2020   CL 110 06/30/2020   CREATININE 0.59 06/30/2020   BUN 9 06/30/2020   CO2 27 06/30/2020   TSH 0.53 06/30/2020   INR 0.9 09/30/2019   HGBA1C 5.3 06/30/2020   Assessment/Plan:  ALIDA GREINER is a 60 y.o. Black or African American [2] female with  has a past medical history  of ACHILLES TENDINITIS, ALLERGIC RHINITIS, Allergy, Anginal pain ([place]) (04/04/2016), Anxiety, Asthma, Chronic kidney disease, Depression, GERD, History of kidney stones, HYPERTENSION, LATERAL EPICONDYLITIS, RIGHT, LOW BACK PAIN, Plantar fascial fibromatosis, Stress, SUBACROMIAL BURSITIS, RIGHT, Thoracic scoliosis (10/01/2015), and TIA (transient ischemic attack).  Encounter for well adult exam with abnormal findings Age and sex appropriate education and counseling updated with regular exercise and diet Referrals for preventative services - none needed Immunizations addressed - none needed Smoking counseling  - counseled to quit, pt not ready Evidence for depression or other mood disorder - none significant Most recent labs reviewed. I have personally reviewed and have noted: 1) the patient's medical and social history 2) The patient's current medications and supplements 3) The patient's height, weight, and BMI have been recorded in the chart   Vitamin D deficiency Last vitamin D Lab Results  Component Value Date   VD25OH 20.29 (L) 06/30/2020   Low to start oral replacement  Smoker Pt counseled to quit, not ready yet,  to f/u any worsening symptoms or concerns  Hyperglycemia Lab Results  Component Value Date   HGBA1C 5.3 06/30/2020   Stable, pt to continue current medical treatment - diet   HLD (hyperlipidemia) Lab Results  Component Value Date   LDLCALC 46 06/30/2020   Stable, pt to continue current statin lipitor 10   Chronic low back pain with left-sided sciatica With mild acute flare on chronic, for predpac asd,  to f/u any worsening symptoms or concerns  Followup: Return in about 6 months (around 12/31/2020).  Cathlean Cower, MD 07/03/2020 9:45 PM [facility]

## 2020-06-30 NOTE — Patient Instructions (Signed)
Please take all new medication as prescribed - the prednisone  Please continue all other medications as before, including the medication for bone per per GYN   Please have the pharmacy call with any other refills you may need.  Please continue your efforts at being more active, low cholesterol diet, and weight control.  You are otherwise up to date with prevention measures today.  Please keep your appointments with your specialists as you may have planned  Please see Dr Sharmaine Base for the lower back and left leg weakness  Please go to the LAB at the blood drawing area for the tests to be done  You will be contacted by phone if any changes need to be made immediately.  Otherwise, you will receive a letter about your results with an explanation, but please check with MyChart first.  Please remember to sign up for MyChart if you have not done so, as this will be important to you in the future with finding out test results, communicating by private email, and scheduling acute appointments online when needed.  Please make an Appointment to return in 6 months, or sooner if needed

## 2020-07-03 ENCOUNTER — Encounter: Payer: Self-pay | Admitting: Internal Medicine

## 2020-07-03 NOTE — Assessment & Plan Note (Signed)
Age and sex appropriate education and counseling updated with regular exercise and diet Referrals for preventative services - none needed Immunizations addressed - none needed Smoking counseling  - counseled to quit, pt not ready Evidence for depression or other mood disorder - none significant Most recent labs reviewed. I have personally reviewed and have noted: 1) the patient's medical and social history 2) The patient's current medications and supplements 3) The patient's height, weight, and BMI have been recorded in the chart  

## 2020-07-03 NOTE — Assessment & Plan Note (Signed)
Last vitamin D Lab Results  Component Value Date   VD25OH 20.29 (L) 06/30/2020   Low to start oral replacement

## 2020-07-03 NOTE — Assessment & Plan Note (Signed)
Pt counseled to quit, not ready yet,  to f/u any worsening symptoms or concerns

## 2020-07-03 NOTE — Assessment & Plan Note (Signed)
With mild acute flare on chronic, for predpac asd,  to f/u any worsening symptoms or concerns

## 2020-07-03 NOTE — Assessment & Plan Note (Signed)
Lab Results  Component Value Date   HGBA1C 5.3 06/30/2020   Stable, pt to continue current medical treatment  - diet  

## 2020-07-03 NOTE — Assessment & Plan Note (Signed)
Lab Results  Component Value Date   LDLCALC 46 06/30/2020   Stable, pt to continue current statin lipitor 10

## 2020-07-06 ENCOUNTER — Other Ambulatory Visit: Payer: Self-pay | Admitting: Internal Medicine

## 2020-07-25 ENCOUNTER — Other Ambulatory Visit: Payer: Self-pay | Admitting: Internal Medicine

## 2020-07-25 NOTE — Telephone Encounter (Signed)
Please refill as per office routine med refill policy (all routine meds refilled for 3 mo or monthly per pt preference up to one year from last visit, then month to month grace period for 3 mo, then further med refills will have to be denied)  

## 2020-07-30 ENCOUNTER — Telehealth: Payer: Self-pay | Admitting: Internal Medicine

## 2020-07-30 NOTE — Telephone Encounter (Signed)
Type of form received: Short-Term Disability   Additional comments: Dropped off forms for 07/28/20 -12/28/2020  Received by: [place] R   Form should be Faxed to: N/A  Form should be mailed to:   [address], [place], [place], [address]  Is patient requesting call for pickup:Y  [phone]  Form placed in the Provider's box.  *Attach charge sheet.  Provider will determine charge.*  Was patient informed of  7-10 business day turn around (Y/N)? N

## 2020-08-05 ENCOUNTER — Telehealth: Payer: Self-pay

## 2020-08-05 NOTE — Telephone Encounter (Signed)
Form has been filled out and faxed. Patient notified that copy is available for pick and that this is the last month the form will be filled out due to this issue turning over to long term. Dr Jenny Reichmann has recommend patient be seen by a psychiatrist. Patient states she already has one.Marland KitchenMarland Kitchen

## 2020-08-10 ENCOUNTER — Ambulatory Visit (INDEPENDENT_AMBULATORY_CARE_PROVIDER_SITE_OTHER): Payer: BC Managed Care – PPO | Admitting: Orthopaedic Surgery

## 2020-08-10 ENCOUNTER — Encounter: Payer: Self-pay | Admitting: Orthopaedic Surgery

## 2020-08-10 VITALS — BP 145/84 | HR 73 | Ht 62.0 in | Wt 153.0 lb

## 2020-08-10 DIAGNOSIS — M519 Unspecified thoracic, thoracolumbar and lumbosacral intervertebral disc disorder: Secondary | ICD-10-CM | POA: Diagnosis not present
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
## 2020-08-11 ENCOUNTER — Other Ambulatory Visit: Payer: Self-pay

## 2020-08-11 ENCOUNTER — Encounter: Payer: Self-pay | Admitting: Internal Medicine

## 2020-08-11 ENCOUNTER — Ambulatory Visit: Payer: BC Managed Care – PPO | Admitting: Internal Medicine

## 2020-08-11 VITALS — BP 124/82 | HR 65 | Temp 98.5°F | Ht 62.0 in | Wt 152.0 lb

## 2020-08-11 DIAGNOSIS — M5442 Lumbago with sciatica, left side: Secondary | ICD-10-CM | POA: Diagnosis not present

## 2020-08-11 DIAGNOSIS — F17200 Nicotine dependence, unspecified, uncomplicated: Secondary | ICD-10-CM

## 2020-08-11 DIAGNOSIS — F418 Other specified anxiety disorders: Secondary | ICD-10-CM

## 2020-08-11 DIAGNOSIS — I10 Essential (primary) hypertension: Secondary | ICD-10-CM | POA: Diagnosis not present

## 2020-08-11 DIAGNOSIS — R739 Hyperglycemia, unspecified: Secondary | ICD-10-CM | POA: Diagnosis not present

## 2020-08-11 DIAGNOSIS — G8929 Other chronic pain: Secondary | ICD-10-CM

## 2020-08-11 MED ORDER — CELECOXIB 200 MG PO CAPS: 200.0000 mg | ORAL_CAPSULE | Freq: Two times a day (BID) | ORAL | 11 refills | Status: DC

## 2020-08-11 NOTE — Patient Instructions (Signed)
Please take all new medication as prescribed - the celebrex for pain  Please continue all other medications as before, and refills have been done if requested.  Please have the pharmacy call with any other refills you may need.  Please continue your efforts at being more active, low cholesterol diet, and weight control.  Please keep your appointments with your specialists as you may have planned  Your forms will be emailed  Please make an Appointment to return in 6 months, or sooner if needed

## 2020-08-11 NOTE — Progress Notes (Signed)
Patient ID: ADAJAH COCKING, female   DOB: 09/23/1960, 60 y.o.   MRN: [id number]        Chief Complaint: follow up anxiety, depression, chronic lbp, htn, hyperglyemia, smoker       HPI:  [name] Walker is a 60 y.o. female here to f/u, has persistent anxiety depression which has been debilitating with prior hospn for possible stroke ruled out; currently good compliance with meds, and Denies worsening depressive symptoms, suicidal ideation, or panic; Has been seeing counseling but is currently unable to return to work.  We have been signing monthly forms for STD recently.  Pt plans to continue medication, counseling but asks for contd medical STD due to persistent debilitating condition, and will be retiring in Nov 2022.  Still smoking, not ready to quit.  Pt continues to have recurring LBP without change in severity, bowel or bladder change, fever, wt loss, or falls, but has persistent left > right sided leg sciatica to knees, no indication for surgury per Dr Dutch Gray per pt, but pain is still about 5/10 daily.  .        Wt Readings from Last 3 Encounters:  08/11/20 152 lb (68.9 kg)  08/10/20 153 lb (69.4 kg)  06/30/20 153 lb (69.4 kg)   BP Readings from Last 3 Encounters:  08/11/20 124/82  08/10/20 (!) 145/84  06/30/20 138/90         Past Medical History:  Diagnosis Date   ACHILLES TENDINITIS    ALLERGIC RHINITIS    Allergy    Anginal pain ([place]) 04/04/2016   Pt currently feels like she is having muscle spasms and aching in her chest   Anxiety    Asthma    Chronic kidney disease    Depression    GERD    History of kidney stones    HYPERTENSION    LATERAL EPICONDYLITIS, RIGHT    LOW BACK PAIN    Plantar fascial fibromatosis    Stress    SUBACROMIAL BURSITIS, RIGHT    Thoracic scoliosis 10/01/2015   TIA (transient ischemic attack)    Past Surgical History:  Procedure Laterality Date   ANTERIOR CERVICAL DECOMP/DISCECTOMY FUSION N/A 04/07/2016   Procedure: Cervical two-three  Anterior cervical decompression/discectomy/fusion;  Surgeon: Ashok Pall, MD;  Location: [facility];  Service: Neurosurgery;  Laterality: N/A;   BACK SURGERY  2015   lower back   EXTRACORPOREAL SHOCK WAVE LITHOTRIPSY Left 09/28/2016   Procedure: LEFT EXTRACORPOREAL SHOCK WAVE LITHOTRIPSY (ESWL);  Surgeon: Festus Aloe, MD;  Location: [facility];  Service: Urology;  Laterality: Left;   EXTRACORPOREAL SHOCK WAVE LITHOTRIPSY Right 04/08/2018   Procedure: EXTRACORPOREAL SHOCK WAVE LITHOTRIPSY (ESWL);  Surgeon: Ardis Hughs, MD;  Location: [facility];  Service: Urology;  Laterality: Right;   KIDNEY SURGERY     LITHOTRIPSY  05/2016   TUBAL LIGATION      reports that she has been smoking cigarettes. She has a 10.00 pack-year smoking history. She has never used smokeless tobacco. She reports that she does not drink alcohol and does not use drugs. family history includes Breast cancer in her maternal aunt; Colon cancer in her father and sister; Diabetes in her mother; Prostate cancer in her father. Allergies  Allergen Reactions   Flexeril [Cyclobenzaprine] Swelling    Tongue swells   Lisinopril Other (See Comments)    ? Possible tongue swelling    Other Palpitations    ALL MUSCLE RELAXERS-PER PATIENT   Robaxin [Methocarbamol] Other (See Comments)  Tongue swelling   Contrast Media [Iodinated Diagnostic Agents] Itching   Gabapentin Other (See Comments)   Lyrica [Pregabalin]     Anxiety attacks   Current Outpatient Medications on File Prior to Visit  Medication Sig Dispense Refill   acetaminophen (TYLENOL) 500 MG tablet Take 1,000 mg by mouth every 6 (six) hours as needed for mild pain.     albuterol (VENTOLIN HFA) 108 (90 Base) MCG/ACT inhaler Inhale 2 puffs into the lungs every 6 (six) hours as needed for wheezing or shortness of breath. 18 g 11   alendronate (FOSAMAX) 70 MG tablet Take 1 tablet (70 mg total) by mouth every 7 (seven) days. Take with a full glass of water on an empty stomach. 4  tablet 11   amLODipine (NORVASC) 5 MG tablet Take 1 tablet by mouth once daily 90 tablet 0   aspirin EC 81 MG tablet Take 1 tablet (81 mg total) by mouth daily. Swallow whole. 30 tablet 11   atorvastatin (LIPITOR) 10 MG tablet Take 1 tablet (10 mg total) by mouth daily. 90 tablet 3   cetirizine (ZYRTEC) 10 MG tablet Take 10 mg by mouth daily as needed for allergies.     Cholecalciferol (THERA-D 2000) 50 MCG (2000 UT) TABS 1 tab by mouth once daily 30 tablet 99   citalopram (CELEXA) 20 MG tablet Take 1 tablet (20 mg total) by mouth daily. 90 tablet 3   famotidine (PEPCID) 40 MG tablet Take 1 tablet (40 mg total) by mouth at bedtime. 30 tablet 11   glycopyrrolate (ROBINUL) 2 MG tablet Take 1 tablet (2 mg total) by mouth 2 (two) times daily. 60 tablet 11   lisinopril (ZESTRIL) 10 MG tablet Take by mouth.     LORazepam (ATIVAN) 0.5 MG tablet Take 1 tablet by mouth twice daily as needed for anxiety 60 tablet 2   MYRBETRIQ 25 MG TB24 tablet Take 25 mg by mouth daily.     nicotine polacrilex (NICORETTE) 4 MG gum Take 1 each (4 mg total) by mouth as needed for smoking cessation. 100 tablet 5   ondansetron (ZOFRAN) 8 MG tablet Take 1 tablet (8 mg total) by mouth every 8 (eight) hours as needed for nausea or vomiting. 20 tablet 0   pantoprazole (PROTONIX) 40 MG tablet Take 1 tablet (40 mg total) by mouth 2 (two) times daily before a meal. 60 tablet 11   predniSONE (DELTASONE) 10 MG tablet 3 tabs by mouth per day for 3 days,2tabs per day for 3 days,1tab per day for 3 days 18 tablet 0   traZODone (DESYREL) 50 MG tablet TAKE 1 TABLET BY MOUTH AT BEDTIME AS NEEDED FOR SLEEP 90 tablet 1   No current facility-administered medications on file prior to visit.        ROS:  All others reviewed and negative.  Objective        PE:  BP 124/82 (BP Location: Left Arm, Patient Position: Sitting, Cuff Size: Normal)   Pulse 65   Temp 98.5 F (36.9 C) (Oral)   Ht 5\' 2"  (1.575 m)   Wt 152 lb (68.9 kg)   LMP  08/22/2010   SpO2 98%   BMI 27.80 kg/m                 Constitutional: Pt appears in NAD               HENT: Head: NCAT.  Right Ear: External ear normal.                 Left Ear: External ear normal.                Eyes: . Pupils are equal, round, and reactive to light. Conjunctivae and EOM are normal               Nose: without d/c or deformity               Neck: Neck supple. Gross normal ROM               Cardiovascular: Normal rate and regular rhythm.                 Pulmonary/Chest: Effort normal and breath sounds without rales or wheezing.                Abd:  Soft, NT, ND, + BS, no organomegaly               Neurological: Pt is alert. At baseline orientation, motor grossly intact               Skin: Skin is warm. No rashes, no other new lesions, LE edema - none               Psychiatric: Pt behavior is normal without agitation   Micro: none  Cardiac tracings I have personally interpreted today:  none  Pertinent Radiological findings (summarize): none   Lab Results  Component Value Date   WBC 4.9 06/30/2020   HGB 14.3 06/30/2020   HCT 43.9 06/30/2020   PLT 135.0 (L) 06/30/2020   GLUCOSE 91 06/30/2020   CHOL 122 06/30/2020   TRIG 199.0 (H) 06/30/2020   HDL 36.50 (L) 06/30/2020   LDLDIRECT 117.0 09/06/2017   LDLCALC 46 06/30/2020   ALT 22 06/30/2020   AST 19 06/30/2020   NA 143 06/30/2020   K 3.8 06/30/2020   CL 110 06/30/2020   CREATININE 0.59 06/30/2020   BUN 9 06/30/2020   CO2 27 06/30/2020   TSH 0.53 06/30/2020   INR 0.9 09/30/2019   HGBA1C 5.3 06/30/2020   Assessment/Plan:  ENIOLA CERULLO is a 60 y.o. Black or African American [2] female with  has a past medical history of ACHILLES TENDINITIS, ALLERGIC RHINITIS, Allergy, Anginal pain ([place]) (04/04/2016), Anxiety, Asthma, Chronic kidney disease, Depression, GERD, History of kidney stones, HYPERTENSION, LATERAL EPICONDYLITIS, RIGHT, LOW BACK PAIN, Plantar fascial fibromatosis, Stress,  SUBACROMIAL BURSITIS, RIGHT, Thoracic scoliosis (10/01/2015), and TIA (transient ischemic attack).  Essential hypertension BP Readings from Last 3 Encounters:  08/11/20 124/82  08/10/20 (!) 145/84  06/30/20 138/90   Stable, pt to continue medical treatment norvasc, lisinopril   Hyperglycemia Lab Results  Component Value Date   HGBA1C 5.3 06/30/2020   Stable, pt to continue current medical treatment  - diet   Smoker counseled to quit, pt not ready  Anxiety with depression Pt to continue celexa 20, counselin and forms for STD to be signed  Chronic low back pain with left-sided sciatica Uncontrolled chronic daily persistent, no indication for surgury, ok for celebrex 200 bid prn,  to f/u any worsening symptoms or concerns  Followup: Return in about 6 months (around 02/10/2021).  Cathlean Cower, MD 08/15/2020 8:37 AM [facility]

## 2020-08-15 ENCOUNTER — Encounter: Payer: Self-pay | Admitting: Internal Medicine

## 2020-08-15 NOTE — Assessment & Plan Note (Signed)
Lab Results  Component Value Date   HGBA1C 5.3 06/30/2020   Stable, pt to continue current medical treatment  - diet

## 2020-08-15 NOTE — Assessment & Plan Note (Signed)
Pt to continue celexa 20, counselin and forms for STD to be signed

## 2020-08-15 NOTE — Assessment & Plan Note (Signed)
BP Readings from Last 3 Encounters:  08/11/20 124/82  08/10/20 (!) 145/84  06/30/20 138/90   Stable, pt to continue medical treatment norvasc, lisinopril

## 2020-08-15 NOTE — Assessment & Plan Note (Addendum)
Uncontrolled chronic daily persistent, no indication for surgury, ok for celebrex 200 bid prn,  to f/u any worsening symptoms or concerns

## 2020-08-15 NOTE — Assessment & Plan Note (Signed)
counseled to quit, pt not ready

## 2020-08-25 NOTE — Telephone Encounter (Signed)
Error

## 2020-08-31 NOTE — Telephone Encounter (Signed)
   Patient is requesting a call back in regards to short term disability papers. Please advise

## 2020-09-08 NOTE — Telephone Encounter (Signed)
Patient forgot why she called but is having pressure pains in lower mid section. Scheduled patient for an appointment.

## 2020-09-14 ENCOUNTER — Ambulatory Visit: Payer: BC Managed Care – PPO | Admitting: Internal Medicine

## 2020-09-14 ENCOUNTER — Other Ambulatory Visit: Payer: Self-pay

## 2020-09-14 ENCOUNTER — Encounter: Payer: Self-pay | Admitting: Internal Medicine

## 2020-09-14 VITALS — BP 130/74 | HR 73 | Temp 98.4°F | Ht 62.0 in | Wt 155.0 lb

## 2020-09-14 DIAGNOSIS — M5432 Sciatica, left side: Secondary | ICD-10-CM | POA: Diagnosis not present

## 2020-09-14 DIAGNOSIS — R479 Unspecified speech disturbances: Secondary | ICD-10-CM

## 2020-09-14 DIAGNOSIS — R739 Hyperglycemia, unspecified: Secondary | ICD-10-CM

## 2020-09-14 DIAGNOSIS — F418 Other specified anxiety disorders: Secondary | ICD-10-CM | POA: Diagnosis not present

## 2020-09-14 DIAGNOSIS — I10 Essential (primary) hypertension: Secondary | ICD-10-CM

## 2020-09-14 DIAGNOSIS — E559 Vitamin D deficiency, unspecified: Secondary | ICD-10-CM | POA: Diagnosis not present

## 2020-09-14 DIAGNOSIS — R300 Dysuria: Secondary | ICD-10-CM

## 2020-09-14 LAB — URINALYSIS, ROUTINE W REFLEX MICROSCOPIC
Bilirubin Urine: NEGATIVE
Hgb urine dipstick: NEGATIVE
Ketones, ur: NEGATIVE
Leukocytes,Ua: NEGATIVE
Nitrite: NEGATIVE
RBC / HPF: NONE SEEN (ref 0–?)
Specific Gravity, Urine: 1.015 (ref 1.000–1.030)
Total Protein, Urine: NEGATIVE
Urine Glucose: NEGATIVE
Urobilinogen, UA: 1 (ref 0.0–1.0)
pH: 6.5 (ref 5.0–8.0)

## 2020-09-14 MED ORDER — CITALOPRAM HYDROBROMIDE 40 MG PO TABS: 40.0000 mg | ORAL_TABLET | Freq: Every day | ORAL | 3 refills | Status: DC

## 2020-09-14 NOTE — Patient Instructions (Signed)
Your forms will be signed for Friday  Please take OTC Vitamin D3 at 2000 units per day, indefinitely  Ok to increase the celexa to 40 mg per day  Please continue all other medications as before, and refills have been done if requested.  Please have the pharmacy call with any other refills you may need.  Please continue your efforts at being more active, low cholesterol diet, and weight control.  Please keep your appointments with your specialists as you may have planned  Please go to the LAB at the blood drawing area for the tests to be done - just the urine test today  You will be contacted by phone if any changes need to be made immediately.  Otherwise, you will receive a letter about your results with an explanation, but please check with MyChart first.  Please remember to sign up for MyChart if you have not done so, as this will be important to you in the future with finding out test results, communicating by private email, and scheduling acute appointments online when needed.  Please make an Appointment to return in 6 months, or sooner if needed

## 2020-09-14 NOTE — Assessment & Plan Note (Signed)
Last vitamin D Lab Results  Component Value Date   VD25OH 20.29 (L) 06/30/2020   Low, to start oral replacement

## 2020-09-14 NOTE — Progress Notes (Signed)
Patient ID: [name] Walker, female   DOB: 1961-02-07, 60 y.o.   MRN: [id number]        Chief Complaint: follow up low back pain, low vit d, dysuria, and anxiety depression        HPI:  [name] Walker is a 60 y.o. female here with c/o overall 1 yr persistently recurring left more than right sciatica symptoms I with occasional tingling, no weakness, falls or giveaways or falls, might be more frequent and severe recently but still better with alleve arthritis bid prn, nothing else makes better or worse.  Did see ortho aug 2021 with MRI c/w mild worsening lumbar DJD DDD and several possible areas of nerve irriation.  Not taking Vit D.  Also with mild 2 days urinary symptoms such as dysuria, frequency, but no urgency, flank pain, hematuria or n/v, fever, chills.  Also c/o mild worsening depressive symptoms, but no suicidal ideation, or panic; has ongoing anxiety, not doing well but no panic.  Pt denies chest pain, increased sob or doe, wheezing, orthopnea, PND, increased LE swelling, palpitations, dizziness or syncope.   Pt denies polydipsia, polyuria, or new focal neuro s/s.   Pt denies fever, wt loss, night sweats, loss of appetite, or other constitutional symptoms         Wt Readings from Last 3 Encounters:  09/14/20 155 lb (70.3 kg)  08/11/20 152 lb (68.9 kg)  08/10/20 153 lb (69.4 kg)   BP Readings from Last 3 Encounters:  09/14/20 130/74  08/11/20 124/82  08/10/20 (!) 145/84         Past Medical History:  Diagnosis Date   ACHILLES TENDINITIS    ALLERGIC RHINITIS    Allergy    Anginal pain ([place]) 04/04/2016   Pt currently feels like she is having muscle spasms and aching in her chest   Anxiety    Asthma    Chronic kidney disease    Depression    GERD    History of kidney stones    HYPERTENSION    LATERAL EPICONDYLITIS, RIGHT    LOW BACK PAIN    Plantar fascial fibromatosis    Stress    SUBACROMIAL BURSITIS, RIGHT    Thoracic scoliosis 10/01/2015   TIA (transient ischemic attack)     Past Surgical History:  Procedure Laterality Date   ANTERIOR CERVICAL DECOMP/DISCECTOMY FUSION N/A 04/07/2016   Procedure: Cervical two-three Anterior cervical decompression/discectomy/fusion;  Surgeon: Ashok Pall, MD;  Location: [facility];  Service: Neurosurgery;  Laterality: N/A;   BACK SURGERY  2015   lower back   EXTRACORPOREAL SHOCK WAVE LITHOTRIPSY Left 09/28/2016   Procedure: LEFT EXTRACORPOREAL SHOCK WAVE LITHOTRIPSY (ESWL);  Surgeon: Festus Aloe, MD;  Location: [facility];  Service: Urology;  Laterality: Left;   EXTRACORPOREAL SHOCK WAVE LITHOTRIPSY Right 04/08/2018   Procedure: EXTRACORPOREAL SHOCK WAVE LITHOTRIPSY (ESWL);  Surgeon: Ardis Hughs, MD;  Location[facility];  Service: Urology;  Laterality: Right;   KIDNEY SURGERY     LITHOTRIPSY  05/2016   TUBAL LIGATION      reports that she has been smoking cigarettes. She has a 10.00 pack-year smoking history. She has never used smokeless tobacco. She reports that she does not drink alcohol and does not use drugs. family history includes Breast cancer in her maternal aunt; Colon cancer in her father and sister; Diabetes in her mother; Prostate cancer in her father. Allergies  Allergen Reactions   Flexeril [Cyclobenzaprine] Swelling    Tongue swells   Lisinopril Other (  See Comments)    ? Possible tongue swelling    Other Palpitations    ALL MUSCLE RELAXERS-PER PATIENT   Robaxin [Methocarbamol] Other (See Comments)    Tongue swelling   Contrast Media [Iodinated Diagnostic Agents] Itching   Gabapentin Other (See Comments)   Lyrica [Pregabalin]     Anxiety attacks   Current Outpatient Medications on File Prior to Visit  Medication Sig Dispense Refill   acetaminophen (TYLENOL) 500 MG tablet Take 1,000 mg by mouth every 6 (six) hours as needed for mild pain.     albuterol (VENTOLIN HFA) 108 (90 Base) MCG/ACT inhaler Inhale 2 puffs into the lungs every 6 (six) hours as needed for wheezing or shortness of breath. 18 g 11    alendronate (FOSAMAX) 70 MG tablet Take 1 tablet (70 mg total) by mouth every 7 (seven) days. Take with a full glass of water on an empty stomach. 4 tablet 11   amLODipine (NORVASC) 5 MG tablet Take 1 tablet by mouth once daily 90 tablet 0   aspirin EC 81 MG tablet Take 1 tablet (81 mg total) by mouth daily. Swallow whole. 30 tablet 11   atorvastatin (LIPITOR) 10 MG tablet Take 1 tablet (10 mg total) by mouth daily. 90 tablet 3   celecoxib (CELEBREX) 200 MG capsule Take 1 capsule (200 mg total) by mouth 2 (two) times daily. 60 capsule 11   cetirizine (ZYRTEC) 10 MG tablet Take 10 mg by mouth daily as needed for allergies.     Cholecalciferol (THERA-D 2000) 50 MCG (2000 UT) TABS 1 tab by mouth once daily 30 tablet 99   famotidine (PEPCID) 40 MG tablet Take 1 tablet (40 mg total) by mouth at bedtime. 30 tablet 11   glycopyrrolate (ROBINUL) 2 MG tablet Take 1 tablet (2 mg total) by mouth 2 (two) times daily. 60 tablet 11   lisinopril (ZESTRIL) 10 MG tablet Take by mouth.     LORazepam (ATIVAN) 0.5 MG tablet Take 1 tablet by mouth twice daily as needed for anxiety 60 tablet 2   MYRBETRIQ 25 MG TB24 tablet Take 25 mg by mouth daily.     nicotine polacrilex (NICORETTE) 4 MG gum Take 1 each (4 mg total) by mouth as needed for smoking cessation. 100 tablet 5   ondansetron (ZOFRAN) 8 MG tablet Take 1 tablet (8 mg total) by mouth every 8 (eight) hours as needed for nausea or vomiting. 20 tablet 0   pantoprazole (PROTONIX) 40 MG tablet Take 1 tablet (40 mg total) by mouth 2 (two) times daily before a meal. 60 tablet 11   predniSONE (DELTASONE) 10 MG tablet 3 tabs by mouth per day for 3 days,2tabs per day for 3 days,1tab per day for 3 days 18 tablet 0   traZODone (DESYREL) 50 MG tablet TAKE 1 TABLET BY MOUTH AT BEDTIME AS NEEDED FOR SLEEP 90 tablet 1   naloxone (NARCAN) nasal spray 4 mg/0.1 mL ADMINISTER A SINGLE SPRAY IN ONE NOSTRIL UPON SIGNS OF OPIOID OVERDOSE. CALL 911. REPEAT AFTER 3 MINUTES IF NO  RESPONSE.     No current facility-administered medications on file prior to visit.        ROS:  All others reviewed and negative.  Objective        PE:  BP 130/74 (BP Location: Left Arm, Patient Position: Sitting, Cuff Size: Normal)   Pulse 73   Temp 98.4 F (36.9 C) (Oral)   Ht 5\' 2"  (1.575 m)   Wt 155 lb (70.3  kg)   LMP 08/22/2010   SpO2 99%   BMI 28.35 kg/m                 Constitutional: Pt appears in NAD               HENT: Head: NCAT.                Right Ear: External ear normal.                 Left Ear: External ear normal.                Eyes: . Pupils are equal, round, and reactive to light. Conjunctivae and EOM are normal               Nose: without d/c or deformity               Neck: Neck supple. Gross normal ROM               Cardiovascular: Normal rate and regular rhythm.                 Pulmonary/Chest: Effort normal and breath sounds without rales or wheezing.                Abd:  Soft, NT, ND, + BS, no organomegaly               Neurological: Pt is alert. At baseline orientation, motor grossly intact               Skin: Skin is warm. No rashes, no other new lesions, LE edema - none               Psychiatric: Pt behavior is normal without agitation , nervous depressed affect  Micro: none  Cardiac tracings I have personally interpreted today:  none  Pertinent Radiological findings (summarize): none   Lab Results  Component Value Date   WBC 4.9 06/30/2020   HGB 14.3 06/30/2020   HCT 43.9 06/30/2020   PLT 135.0 (L) 06/30/2020   GLUCOSE 91 06/30/2020   CHOL 122 06/30/2020   TRIG 199.0 (H) 06/30/2020   HDL 36.50 (L) 06/30/2020   LDLDIRECT 117.0 09/06/2017   LDLCALC 46 06/30/2020   ALT 22 06/30/2020   AST 19 06/30/2020   NA 143 06/30/2020   K 3.8 06/30/2020   CL 110 06/30/2020   CREATININE 0.59 06/30/2020   BUN 9 06/30/2020   CO2 27 06/30/2020   TSH 0.53 06/30/2020   INR 0.9 09/30/2019   HGBA1C 5.3 06/30/2020   Assessment/Plan:  [name] Walker is a 60 y.o. Black or African American [2] female with  has a past medical history of ACHILLES TENDINITIS, ALLERGIC RHINITIS, Allergy, Anginal pain ([place]) (04/04/2016), Anxiety, Asthma, Chronic kidney disease, Depression, GERD, History of kidney stones, HYPERTENSION, LATERAL EPICONDYLITIS, RIGHT, LOW BACK PAIN, Plantar fascial fibromatosis, Stress, SUBACROMIAL BURSITIS, RIGHT, Thoracic scoliosis (10/01/2015), and TIA (transient ischemic attack).  Vitamin D deficiency Last vitamin D Lab Results  Component Value Date   VD25OH 20.29 (L) 06/30/2020   Low, to start oral replacement   Left sided sciatica With mild persistent and subjectively worsening freqency and severity, o/w doing ok with arthritis tylenol prn and exam no change - cont same tx,  to f/u any worsening symptoms or concerns  Hyperglycemia Lab Results  Component Value Date   HGBA1C 5.3 06/30/2020   Stable, pt to continue current medical treatment  - diet  Essential hypertension BP Readings from Last 3 Encounters:  09/14/20 130/74  08/11/20 124/82  08/10/20 (!) 145/84   Stable, pt to continue medical treatment  - norvasc, lisinopril   Dysuria Exam benign, but with symptoms with check ua and culture,  to f/u any worsening symptoms or concerns  Anxiety with depression Mild to mod uncontrolled, for increased celexa 40 qd, declines referral for counseling or psychiatry for now  Followup: Return in about 6 months (around 03/17/2021), or if symptoms worsen or fail to improve.  Cathlean Cower, MD 09/16/2020 10:56 PM [facility]

## 2020-09-15 LAB — URINE CULTURE: Result:: NO GROWTH

## 2020-09-16 ENCOUNTER — Encounter: Payer: Self-pay | Admitting: Internal Medicine

## 2020-09-16 NOTE — Assessment & Plan Note (Signed)
Mild to mod uncontrolled, for increased celexa 40 qd, declines referral for counseling or psychiatry for now

## 2020-09-16 NOTE — Assessment & Plan Note (Signed)
Exam benign, but with symptoms with check ua and culture,  to f/u any worsening symptoms or concerns

## 2020-09-16 NOTE — Assessment & Plan Note (Signed)
With mild persistent and subjectively worsening freqency and severity, o/w doing ok with arthritis tylenol prn and exam no change - cont same tx,  to f/u any worsening symptoms or concerns

## 2020-09-16 NOTE — Assessment & Plan Note (Signed)
BP Readings from Last 3 Encounters:  09/14/20 130/74  08/11/20 124/82  08/10/20 (!) 145/84   Stable, pt to continue medical treatment  - norvasc, lisinopril

## 2020-09-16 NOTE — Assessment & Plan Note (Signed)
Lab Results  Component Value Date   HGBA1C 5.3 06/30/2020   Stable, pt to continue current medical treatment  - diet

## 2020-09-21 ENCOUNTER — Other Ambulatory Visit: Payer: Self-pay | Admitting: Internal Medicine

## 2020-10-04 ENCOUNTER — Other Ambulatory Visit: Payer: Self-pay | Admitting: Internal Medicine

## 2020-10-05 ENCOUNTER — Other Ambulatory Visit: Payer: Self-pay | Admitting: Internal Medicine

## 2020-10-26 ENCOUNTER — Other Ambulatory Visit: Payer: Self-pay | Admitting: Internal Medicine

## 2020-10-26 NOTE — Telephone Encounter (Signed)
Please refill as per office routine med refill policy (all routine meds to be refilled for 3 mo or monthly (per pt preference) up to one year from last visit, then month to month grace period for 3 mo, then further med refills will have to be denied) ? ?

## 2020-11-11 ENCOUNTER — Telehealth: Payer: Self-pay | Admitting: Internal Medicine

## 2020-11-11 DIAGNOSIS — G8929 Other chronic pain: Secondary | ICD-10-CM

## 2020-11-11 NOTE — Telephone Encounter (Signed)
Ok done

## 2020-11-11 NOTE — Telephone Encounter (Signed)
   Patient requesting referral to [facility] for pain in legs

## 2020-11-11 NOTE — Telephone Encounter (Signed)
Ok this is done 

## 2020-11-30 ENCOUNTER — Encounter: Payer: Self-pay | Admitting: Physical Medicine and Rehabilitation

## 2020-12-21 ENCOUNTER — Telehealth: Payer: Self-pay | Admitting: Internal Medicine

## 2020-12-21 NOTE — Telephone Encounter (Signed)
Patient notified and verbalizes understanding. Per patient will contact her counselor for recommendations.

## 2020-12-21 NOTE — Telephone Encounter (Signed)
Patient calling to inform her husband passed on 2020-12-22  Patient states due to her husbands' passing her anxiety and depression symptoms has increased   Patient states current medication LORazepam (ATIVAN) 0.5 MG tablet is not helping w/increased symptoms  Patient is requesting a different rx to help w/symptoms  Patient is requesting a call back

## 2020-12-21 NOTE — Telephone Encounter (Signed)
Sorry, I dont feel comfortable with changing her ativan and celexa, but I can refer to counseling if she likes - just let me know, thanks

## 2021-01-27 ENCOUNTER — Other Ambulatory Visit: Payer: Self-pay | Admitting: Internal Medicine

## 2021-01-27 NOTE — Telephone Encounter (Signed)
Please refill as per office routine med refill policy (all routine meds to be refilled for 3 mo or monthly (per pt preference) up to one year from last visit, then month to month grace period for 3 mo, then further med refills will have to be denied) ? ?

## 2021-01-30 ENCOUNTER — Other Ambulatory Visit: Payer: Self-pay | Admitting: Internal Medicine

## 2021-02-02 IMAGING — MG DIGITAL SCREENING BILAT W/ TOMO W/ CAD
8 of 14 series · 8 of 40 positions shown · non-contrast
Comparison: Previous exam(s).

CLINICAL DATA: Screening.

EXAM:
DIGITAL SCREENING BILATERAL MAMMOGRAM WITH TOMO AND CAD

[R MLO synth-2D (1 of 2)]
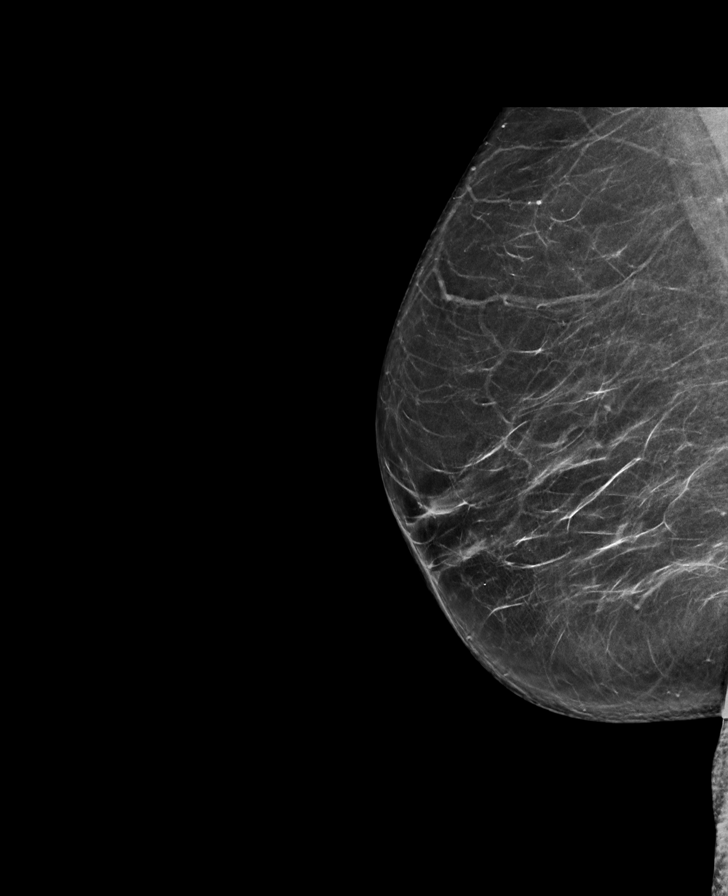

[L CC synth-2D (1 of 2)]
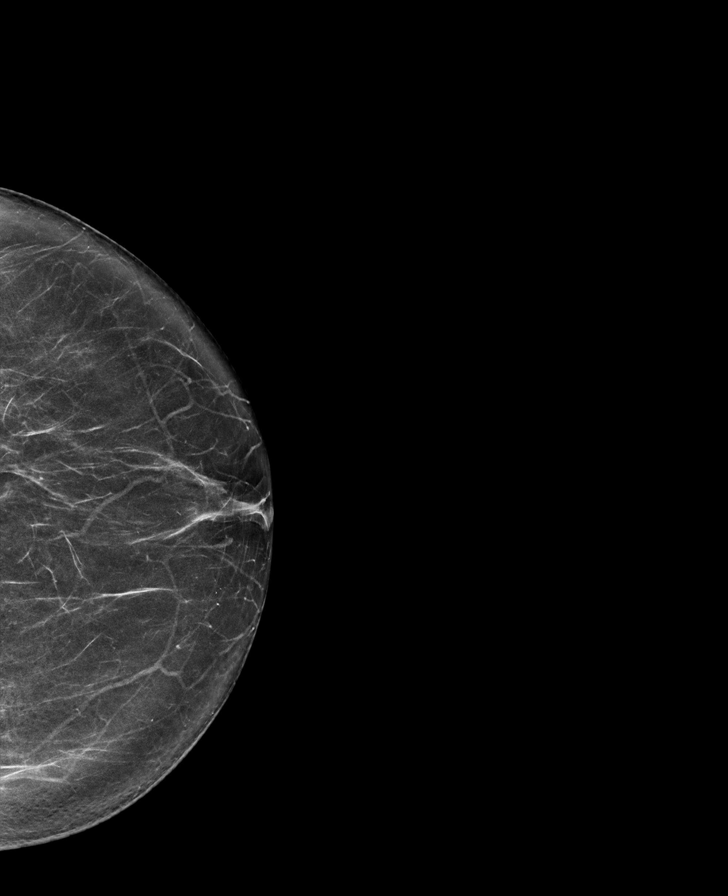

[L MLO synth-2D (1 of 2)]
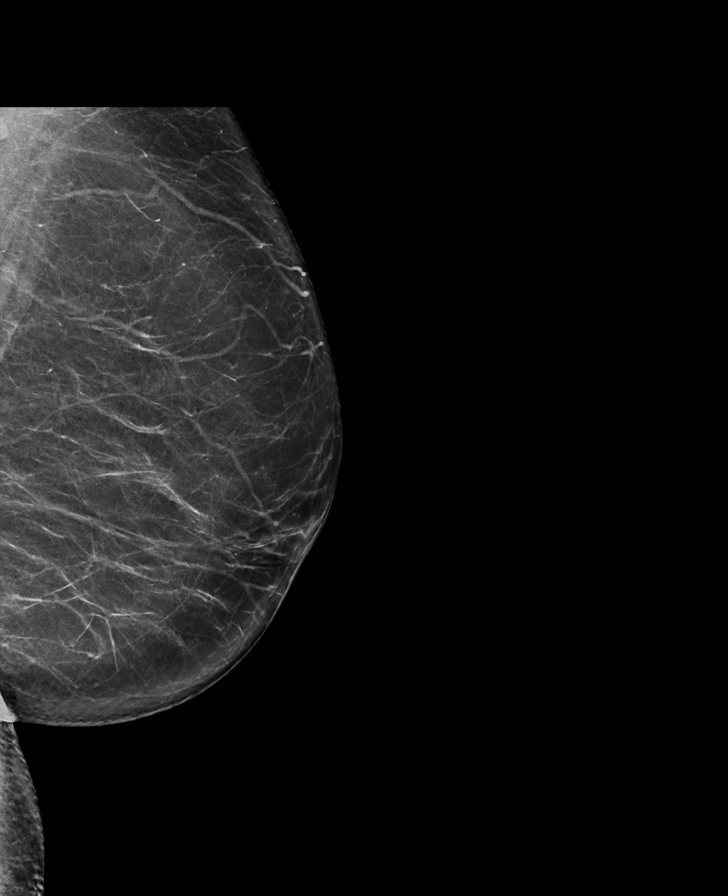

[R MLO synth-2D (2 of 2)]
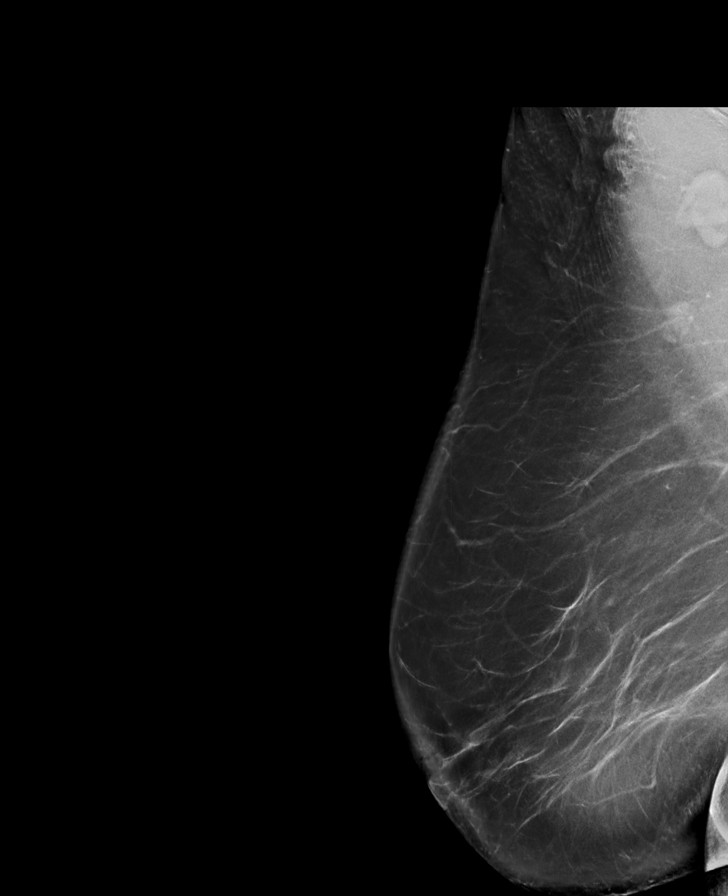

[L MLO synth-2D (2 of 2)]
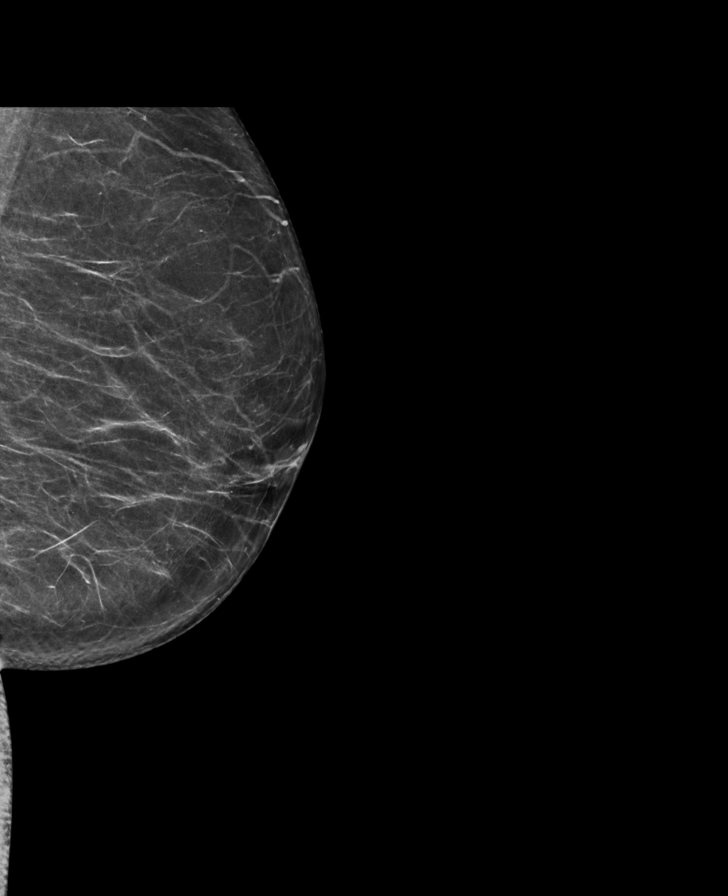

[R CC synth-2D]
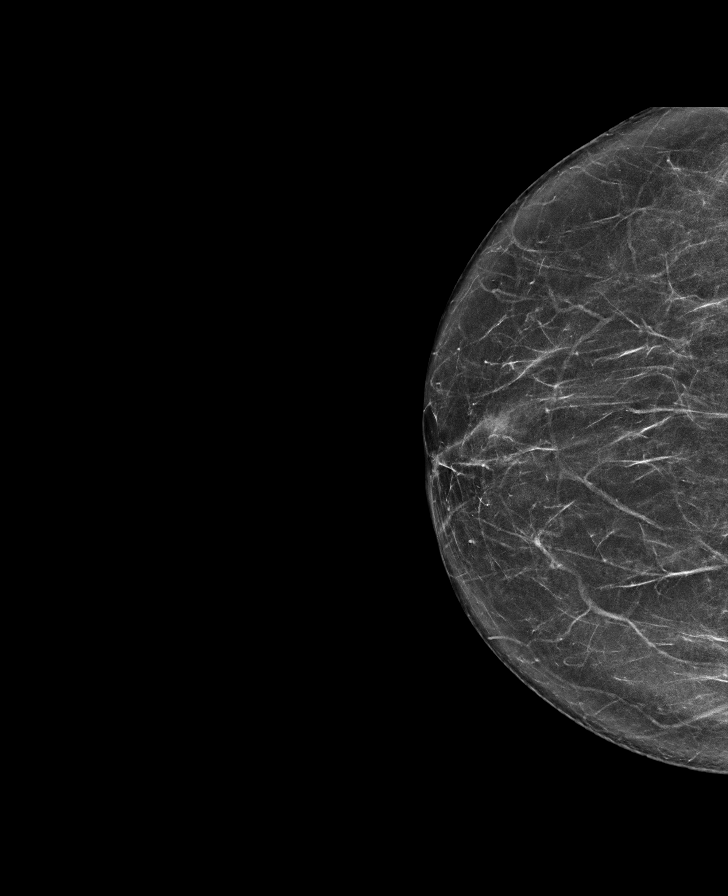

[L CC synth-2D (2 of 2)]
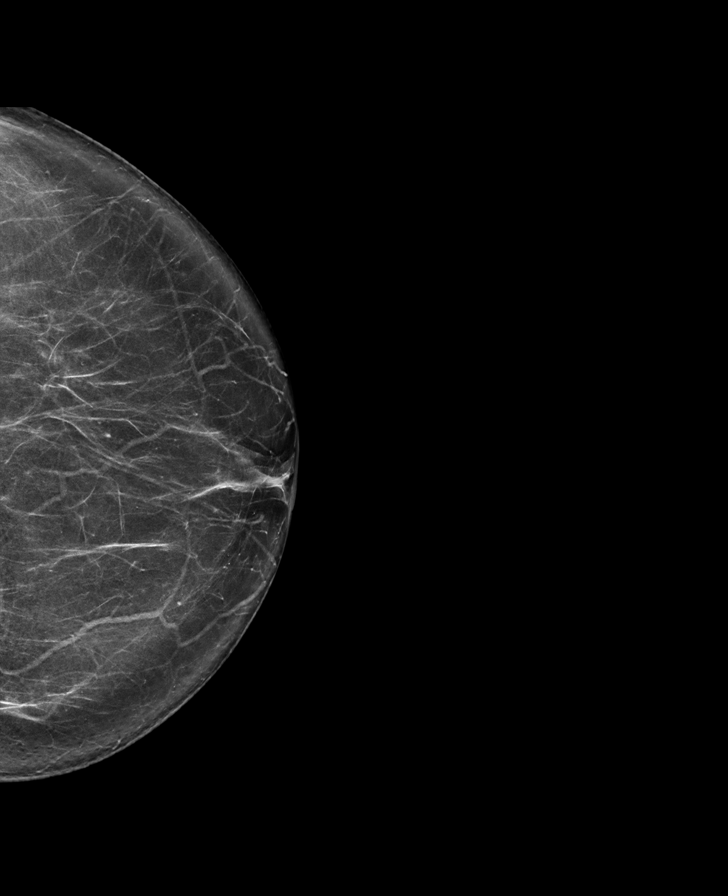

[R MLO tomo · tomo slice 52/103.0]
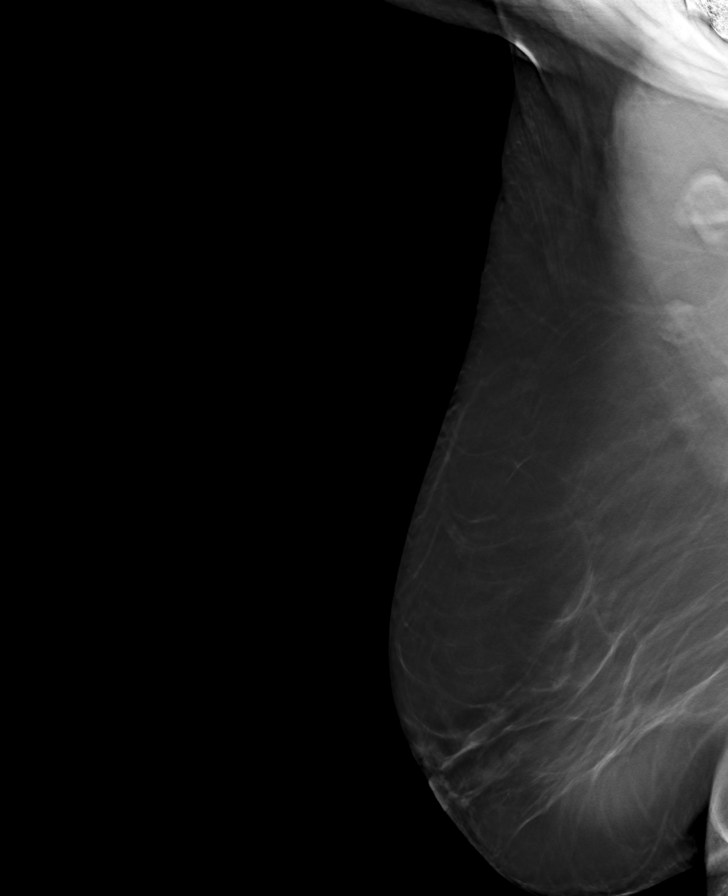

[8 of 40 positions shown; findings below may reference images not displayed]

ACR Breast Density Category b: There are scattered areas of
fibroglandular density.
FINDINGS: There are no findings suspicious for malignancy. Images were
processed with CAD.
IMPRESSION: No mammographic evidence of malignancy. A result letter of this
screening mammogram will be mailed directly to the patient.

RECOMMENDATION:
Screening mammogram in one year. (Code:[id number])

BI-RADS CATEGORY  1: Negative.

## 2021-02-04 ENCOUNTER — Telehealth: Payer: Self-pay | Admitting: Internal Medicine

## 2021-02-04 NOTE — Telephone Encounter (Signed)
1.Medication Requested: predniSONE (DELTASONE) 10 MG tablet 2. Pharmacy (Name, Street, [place]): [facility], [address] Phone:  [phone]  Fax:  [phone]     3. On Med List: Y  4. Last Visit with PCP: 09/14/20  5. Next visit date with PCP: 03/17/21   Agent: Please be advised that RX refills may take up to 3 business days. We ask that you follow-up with your pharmacy.

## 2021-02-04 NOTE — Telephone Encounter (Signed)
Unable to prescribe prednisone without patient being seen. Contacted patient and she states that she is having back pain and wanted something to help. Scheduled patient an appt with Dr. Jenny Reichmann

## 2021-02-09 ENCOUNTER — Ambulatory Visit: Payer: BC Managed Care – PPO | Admitting: Internal Medicine

## 2021-02-10 ENCOUNTER — Encounter
Payer: BC Managed Care – PPO | Attending: Physical Medicine and Rehabilitation | Admitting: Physical Medicine & Rehabilitation

## 2021-02-10 ENCOUNTER — Encounter: Payer: Self-pay | Admitting: Physical Medicine & Rehabilitation

## 2021-02-10 ENCOUNTER — Other Ambulatory Visit: Payer: Self-pay

## 2021-02-10 VITALS — BP 156/100 | HR 71 | Temp 97.6°F | Ht 62.0 in | Wt 156.0 lb

## 2021-02-10 DIAGNOSIS — G8929 Other chronic pain: Secondary | ICD-10-CM | POA: Insufficient documentation

## 2021-02-10 DIAGNOSIS — M5440 Lumbago with sciatica, unspecified side: Secondary | ICD-10-CM | POA: Insufficient documentation
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
## 2021-02-14 ENCOUNTER — Encounter: Payer: BC Managed Care – PPO | Admitting: Physical Medicine and Rehabilitation

## 2021-02-16 LAB — TOXASSURE SELECT,+ANTIDEPR,UR

## 2021-02-22 ENCOUNTER — Telehealth: Payer: Self-pay | Admitting: *Deleted

## 2021-02-22 ENCOUNTER — Encounter: Payer: Self-pay | Admitting: Internal Medicine

## 2021-02-22 NOTE — Telephone Encounter (Signed)
Urine drug screen for this encounter is consistent for prescribed medication 

## 2021-03-02 ENCOUNTER — Telehealth: Payer: Self-pay | Admitting: Internal Medicine

## 2021-03-02 NOTE — Telephone Encounter (Signed)
..  Type of form received:Disability  Additional comments:   Received by: Tye Savoy should be Faxed/mailed to: (address/ fax #)  Is patient requesting call for pickup:Yes  Form placed:  In providers bin  Attach charge sheet.  Provider will determine charge.  Individual made aware of 3-5 business day turn around Yes?

## 2021-03-03 NOTE — Telephone Encounter (Signed)
Sorry, I havent seen any disability paperwork, will forward to [employer]

## 2021-03-07 NOTE — Telephone Encounter (Signed)
Paperwork filled out and contacted patient to pickup. Patient states that she does not need those papers any longer and does not know how we received them. Advised patient that will shred papers.

## 2021-03-17 ENCOUNTER — Ambulatory Visit: Payer: BC Managed Care – PPO | Admitting: Internal Medicine

## 2021-03-25 ENCOUNTER — Other Ambulatory Visit: Payer: Self-pay | Admitting: Internal Medicine

## 2021-03-31 ENCOUNTER — Telehealth: Payer: Self-pay | Admitting: *Deleted

## 2021-03-31 ENCOUNTER — Encounter
Payer: BC Managed Care – PPO | Attending: Physical Medicine and Rehabilitation | Admitting: Physical Medicine & Rehabilitation

## 2021-03-31 ENCOUNTER — Other Ambulatory Visit: Payer: Self-pay | Admitting: Internal Medicine

## 2021-03-31 ENCOUNTER — Encounter: Payer: Self-pay | Admitting: Physical Medicine & Rehabilitation

## 2021-03-31 ENCOUNTER — Other Ambulatory Visit: Payer: Self-pay | Admitting: Gastroenterology

## 2021-03-31 ENCOUNTER — Other Ambulatory Visit: Payer: Self-pay

## 2021-03-31 VITALS — BP 139/93 | HR 74 | Temp 98.0°F | Ht 62.0 in | Wt 156.0 lb

## 2021-03-31 DIAGNOSIS — M259 Joint disorder, unspecified: Secondary | ICD-10-CM | POA: Insufficient documentation

## 2021-03-31 DIAGNOSIS — M961 Postlaminectomy syndrome, not elsewhere classified: Secondary | ICD-10-CM | POA: Insufficient documentation

## 2021-03-31 DIAGNOSIS — Z1231 Encounter for screening mammogram for malignant neoplasm of breast: Secondary | ICD-10-CM

## 2021-03-31 DIAGNOSIS — K219 Gastro-esophageal reflux disease without esophagitis: Secondary | ICD-10-CM

## 2021-03-31 MED ORDER — ACETAMINOPHEN-CODEINE #3 300-30 MG PO TABS: 1.0000 | ORAL_TABLET | Freq: Two times a day (BID) | ORAL | 1 refills | Status: DC | PRN

## 2021-03-31 NOTE — Telephone Encounter (Signed)
Prior auth for Tylenol #3 submitted to insurance via CoverMyMeds.

## 2021-03-31 NOTE — Patient Instructions (Signed)
Sacroiliac Joint Dysfunction °Sacroiliac joint dysfunction is a condition that causes inflammation on one or both sides of the sacroiliac (SI) joint. The SI joint is the joint between two bones of the pelvis called the sacrum and the ilium. The sacrum is the bone at the base of the spine. The ilium is the large bone that forms the hip. This condition causes deep aching or burning pain in the low back. In some cases, the pain may also spread into one or both buttocks, hips, or thighs. °What are the causes? °This condition may be caused by: °Pregnancy. During pregnancy, extra stress is put on the SI joints because the pelvis widens. °Injury, such as: °Injuries from car crashes. °Sports-related injuries. °Work-related injuries. °Having one leg that is shorter than the other. °Conditions that affect the joints, such as: °Rheumatoid arthritis. °Gout. °Psoriatic arthritis. °Joint infection (septic arthritis). °Sometimes, the cause of SI joint dysfunction is not known. °What are the signs or symptoms? °Symptoms of this condition include: °Aching or burning pain in the lower back. The pain may also spread to other areas, such as: °Buttocks. °Groin. °Thighs. °Muscle spasms in or around the painful areas. °Increased pain when standing, walking, running, stair climbing, bending, or lifting. °How is this diagnosed? °This condition is diagnosed with a physical exam and your medical history. During the exam, the health care provider may move one or both of your legs to different positions to check for pain. Various tests may be done to confirm the diagnosis, including: °Imaging tests to look for other causes of pain. These may include: °MRI. °CT scan. °Bone scan. °Diagnostic injection. A numbing medicine is injected into the SI joint using a needle. If your pain is temporarily improved or stopped after the injection, this can indicate that SI joint dysfunction is the problem. °How is this treated? °Treatment depends on the cause  and severity of your condition. Treatment options can be noninvasive and may include: °Ice or heat applied to the lower back area after an injury. This may help reduce pain and muscle spasms. °Medicines to relieve pain or inflammation or to relax the muscles. °Wearing a back brace (sacroiliac brace) to help support the joint while your back is healing. °Physical therapy to increase muscle strength around the joint and flexibility at the joint. This may also involve learning proper body positions and ways of moving to relieve stress on the joint. °Direct manipulation of the SI joint. °Use of a device that provides electrical stimulation to help reduce pain at the joint. °Other treatments may include: °Injections of steroid medicine into the joint to reduce pain and swelling. °Radiofrequency ablation. This treatment uses heat to burn away nerves that are carrying pain messages from the joint. °Surgery to put in screws and plates that limit or prevent joint motion. This is rare. °Follow these instructions at home: °Medicines °Take over-the-counter and prescription medicines only as told by your health care provider. °Ask your health care provider if the medicine prescribed to you: °Requires you to avoid driving or using machinery. °Can cause constipation. You may need to take these actions to prevent or treat constipation: °Drink enough fluid to keep your urine pale yellow. °Take over-the-counter or prescription medicines. °Eat foods that are high in fiber, such as beans, whole grains, and fresh fruits and vegetables. °Limit foods that are high in fat and processed sugars, such as fried or sweet foods. °If you have a brace: °Wear the brace as told by your health care provider. Remove   it only as told by your health care provider. °Keep the brace clean. °If the brace is not waterproof: °Do not let it get wet. °Cover it with a watertight covering when you take a bath or a shower. °Managing pain, stiffness, and swelling °   °Icing can help with pain and swelling. Heat may help with muscle tension or spasms. Ask your health care provider if you should use ice or heat. °If directed, put ice on the affected area: °If you have a removable brace, remove it as told by your health care provider. °Put ice in a plastic bag. °Place a towel between your skin and the bag. °Leave the ice on for 20 minutes, 2-3 times a day. °Remove the ice if your skin turns bright red. This is very important. If you cannot feel pain, heat, or cold, you have a greater risk of damage to the area. °If directed, apply heat to the affected area as often as told by your health care provider. Use the heat source that your health care provider recommends, such as a moist heat pack or a heating pad. °Place a towel between your skin and the heat source. °Leave the heat on for 20-30 minutes. °Remove the heat if your skin turns bright red. This is especially important if you are unable to feel pain, heat, or cold. You may have a greater risk of getting burned. °General instructions °Rest as needed. Return to your normal activities as told by your health care provider. Ask your health care provider what activities are safe for you. °Do exercises as told by your health care provider or physical therapist. °Keep all follow-up visits. This is important. °Contact a health care provider if: °Your pain is not controlled with medicine. °You have a fever. °Your pain is getting worse. °Get help right away if: °You have weakness, numbness, or tingling in your legs or feet. °You lose control of your bladder or bowels. °Summary °Sacroiliac (SI) joint dysfunction is a condition that causes inflammation on one or both sides of the SI joint. °This condition causes deep aching or burning pain in the low back. In some cases, the pain may also spread into one or both buttocks, hips, or thighs. °Treatment depends on the cause and severity of your condition. It may include medicines to reduce  pain and swelling or to relax muscles. °This information is not intended to replace advice given to you by your health care provider. Make sure you discuss any questions you have with your health care provider. °Document Revised: 06/26/2019 Document Reviewed: 06/26/2019 °Elsevier Patient Education © 2022 [employer] Inc[employer] ° °

## 2021-03-31 NOTE — Telephone Encounter (Signed)
As long as you remain covered by the [facility] and there are no changes to your plan benefits, this request is approved for the following time period: 03/31/2021 - 09/28/2021.  Pharmacy and [facility] notified.

## 2021-03-31 NOTE — Progress Notes (Signed)
Subjective:    Patient ID: [name] Walker, female    DOB: Sep 29, 1960, 61 y.o.   MRN: [id number]  HPI 61 year old female with chronic left greater than right low back and buttock pain.  In addition she does have some pain going down the left lower extremity. UDS ok Dr Ron Agee did L4-5 facet inj equivocal result SI inj not done at that office  Prior surgical history significant for L5-S1 left hemilaminectomy Dr. Cyndy Freeze in 2015 Postoperative MRI demonstrating epidural fibrosis around the left S1 nerve root. Pain Inventory Average Pain 8 Pain Right Now 8 My pain is sharp, stabbing, and aching  In the last 24 hours, has pain interfered with the following? General activity 0 Relation with others 0 Enjoyment of life 0 What TIME of day is your pain at its worst? varies Sleep (in general) NA  Pain is worse with: walking, bending, sitting, standing, and some activites Pain improves with: heat/ice and pacing activities Relief from Meds: 5  Family History  Problem Relation Age of Onset   Colon cancer Sister        dx in her 57s   Diabetes Mother    Colon cancer Father        dx in his 68s   Prostate cancer Father    Breast cancer Maternal Aunt        50's   Esophageal cancer Neg Hx    Stomach cancer Neg Hx    Social History   Socioeconomic History   Marital status: Legally Separated    Spouse name: Les   Number of children: 3   Years of education: Not on file   Highest education level: Not on file  Occupational History   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  Tobacco Use   Smoking status: Every Day    Packs/day: 0.50    Years: 20.00    Pack years: 10.00    Types: Cigarettes   Smokeless tobacco: Never  Vaping Use   Vaping Use: Never used  Substance and Sexual Activity   Alcohol use: No   Drug use: No   Sexual activity: Not Currently    Birth control/protection: Post-menopausal, Surgical    Comment: 1st intercourse 61 yo-Fewer than 5 partners-BTL  Other  Topics Concern   Not on file  Social History Narrative   Not on file   Social Determinants of Health   Financial Resource Strain: Not on file  Food Insecurity: Not on file  Transportation Needs: Not on file  Physical Activity: Not on file  Stress: Not on file  Social Connections: Not on file   Past Surgical History:  Procedure Laterality Date   ANTERIOR CERVICAL DECOMP/DISCECTOMY FUSION N/A 04/07/2016   Procedure: Cervical two-three Anterior cervical decompression/discectomy/fusion;  Surgeon: Ashok Pall, MD;  Location: [facility];  Service: Neurosurgery;  Laterality: N/A;   BACK SURGERY  2015   lower back   EXTRACORPOREAL SHOCK WAVE LITHOTRIPSY Left 09/28/2016   Procedure: LEFT EXTRACORPOREAL SHOCK WAVE LITHOTRIPSY (ESWL);  Surgeon: Festus Aloe, MD;  [facility];  Service: Urology;  Laterality: Left;   EXTRACORPOREAL SHOCK WAVE LITHOTRIPSY Right 04/08/2018   Procedure: EXTRACORPOREAL SHOCK WAVE LITHOTRIPSY (ESWL);  Surgeon: Ardis Hughs, MD;  [facility];  Service: Urology;  Laterality: Right;   KIDNEY SURGERY     LITHOTRIPSY  05/2016   TUBAL LIGATION     Past Surgical History:  Procedure Laterality Date   ANTERIOR CERVICAL DECOMP/DISCECTOMY FUSION N/A 04/07/2016   Procedure: Cervical  two-three Anterior cervical decompression/discectomy/fusion;  Surgeon: Ashok Pall, MD;  Location: [facility];  Service: Neurosurgery;  Laterality: N/A;   BACK SURGERY  2015   lower back   EXTRACORPOREAL SHOCK WAVE LITHOTRIPSY Left 09/28/2016   Procedure: LEFT EXTRACORPOREAL SHOCK WAVE LITHOTRIPSY (ESWL);  Surgeon: Festus Aloe, MD;  [facility];  Service: Urology;  Laterality: Left;   EXTRACORPOREAL SHOCK WAVE LITHOTRIPSY Right 04/08/2018   Procedure: EXTRACORPOREAL SHOCK WAVE LITHOTRIPSY (ESWL);  Surgeon: Ardis Hughs, MD;  [facility];  Service: Urology;  Laterality: Right;   KIDNEY SURGERY     LITHOTRIPSY  05/2016   TUBAL LIGATION     Past Medical History:   Diagnosis Date   ACHILLES TENDINITIS    ALLERGIC RHINITIS    Allergy    Anginal pain ([place]) 04/04/2016   Pt currently feels like she is having muscle spasms and aching in her chest   Anxiety    Asthma    Chronic kidney disease    Depression    GERD    History of kidney stones    HYPERTENSION    LATERAL EPICONDYLITIS, RIGHT    LOW BACK PAIN    Plantar fascial fibromatosis    Stress    SUBACROMIAL BURSITIS, RIGHT    Thoracic scoliosis 10/01/2015   TIA (transient ischemic attack)    BP (!) 139/93    Pulse 74    Temp 98 F (36.7 C) (Oral)    Ht 5\' 2"  (1.575 m)    Wt 156 lb (70.8 kg)    LMP 08/22/2010    SpO2 98%    BMI 28.53 kg/m   Opioid Risk Score:   Fall Risk Score:  `1  Depression screen PHQ 2/9  Depression screen [facility] 2/9 02/10/2021 06/30/2020 06/30/2020 05/11/2020 03/02/2020 10/31/2019 10/03/2018  Decreased Interest 3 0 1 0 0 0 0  Down, Depressed, Hopeless 1 1 1  0 0 1 1  PHQ - 2 Score 4 1 2  0 0 1 1  Altered sleeping 2 - 0 - 3 - -  Tired, decreased energy 0 - 0 - 1 - -  Change in appetite 1 - 0 - 1 - -  Feeling bad or failure about yourself  0 - 0 - 0 - -  Trouble concentrating 0 - 0 - 0 - -  Moving slowly or fidgety/restless 3 - 1 - 2 - -  Suicidal thoughts 0 - 0 - 0 - -  PHQ-9 Score 10 - 3 - 7 - -  Difficult doing work/chores Somewhat difficult - - - Somewhat difficult - -  Some recent data might be hidden       Review of Systems  Constitutional: Negative.   HENT: Negative.    Eyes: Negative.   Respiratory: Negative.    Cardiovascular: Negative.   Gastrointestinal: Negative.   Endocrine: Negative.   Genitourinary: Negative.   Musculoskeletal:  Positive for back pain and gait problem.       Left leg pain posterior   Skin: Negative.   Allergic/Immunologic: Negative.   Hematological: Negative.   Psychiatric/Behavioral: Negative.        Objective:   Physical Exam Vitals and nursing note reviewed.  Constitutional:      Appearance: She is obese.  HENT:     Head:  Normocephalic and atraumatic.  Eyes:     Extraocular Movements: Extraocular movements intact.     Conjunctiva/sclera: Conjunctivae normal.     Pupils: Pupils are equal, round, and reactive to light.  Neurological:     Mental Status: She is alert.  Motor strength 5/5 bilateral hip flexor knee extensor ankle dorsiflexor Negative straight leg raise bilaterally. Sensation intact bilateral L2 L3-L4-  Sacral thrust (prone) : Positive Lateral compression: Negative FABER's: Positive on left Distraction (supine): Negative Thigh thrust test: Positive left greater than right        Assessment & Plan:   1.  Chronic low back buttock pain.  Findings most consistent with sacroiliac disorder we will set her up with left-sided injection Start low-dose of Tylenol 3 twice daily as needed, will send for UDS 2.  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  Already on Effexor, allergic to pregabalin and gabapentin.  Consider S1 transforaminal injection

## 2021-04-12 ENCOUNTER — Telehealth: Payer: Self-pay | Admitting: Internal Medicine

## 2021-04-12 MED ORDER — TRAZODONE HCL 100 MG PO TABS: 100.0000 mg | ORAL_TABLET | Freq: Every day | ORAL | 1 refills | Status: DC

## 2021-04-12 NOTE — Telephone Encounter (Signed)
Pt requesting a dosage increase of traZODone (DESYREL) 50 MG tablet from 1 tablet at bedtime to 2 tablets  Please advise

## 2021-04-12 NOTE — Telephone Encounter (Signed)
Patient advise. Patient wants a dosage increase of her Trazodone.

## 2021-04-12 NOTE — Telephone Encounter (Signed)
[place] for increase trazodone 100 mg qhs prn

## 2021-04-13 NOTE — Telephone Encounter (Signed)
Called patient to inform her that her Trazodone dosage was increase and sent to her pharmacy. Patient verbalize understanding. No further questions

## 2021-04-19 ENCOUNTER — Other Ambulatory Visit: Payer: Self-pay | Admitting: Internal Medicine

## 2021-04-19 DIAGNOSIS — K219 Gastro-esophageal reflux disease without esophagitis: Secondary | ICD-10-CM

## 2021-04-19 MED ORDER — PANTOPRAZOLE SODIUM 40 MG PO TBEC: 40.0000 mg | DELAYED_RELEASE_TABLET | Freq: Two times a day (BID) | ORAL | 5 refills | Status: DC

## 2021-04-22 ENCOUNTER — Telehealth: Payer: Self-pay

## 2021-04-22 NOTE — Telephone Encounter (Signed)
Pt calling in to inform provider that she tested POS for COVID 04/21/21.  FYI

## 2021-04-26 ENCOUNTER — Ambulatory Visit
Admission: RE | Admit: 2021-04-26 | Discharge: 2021-04-26 | Disposition: A | Discharge status: Home / Self Care | Payer: BC Managed Care – PPO | Source: Ambulatory Visit | Attending: Internal Medicine | Admitting: Internal Medicine

## 2021-04-26 DIAGNOSIS — Z1231 Encounter for screening mammogram for malignant neoplasm of breast: Secondary | ICD-10-CM

## 2021-05-03 ENCOUNTER — Other Ambulatory Visit: Payer: Self-pay | Admitting: Internal Medicine

## 2021-05-03 NOTE — Telephone Encounter (Signed)
Please refill as per office routine med refill policy (all routine meds to be refilled for 3 mo or monthly (per pt preference) up to one year from last visit, then month to month grace period for 3 mo, then further med refills will have to be denied) ? ?

## 2021-05-13 ENCOUNTER — Other Ambulatory Visit: Payer: Self-pay

## 2021-05-13 ENCOUNTER — Encounter: Payer: Self-pay | Admitting: Physical Medicine & Rehabilitation

## 2021-05-13 ENCOUNTER — Encounter
Payer: BC Managed Care – PPO | Attending: Physical Medicine and Rehabilitation | Admitting: Physical Medicine & Rehabilitation

## 2021-05-13 VITALS — BP 142/89 | HR 70 | Temp 97.7°F | Ht 62.0 in | Wt 156.0 lb

## 2021-05-13 DIAGNOSIS — M259 Joint disorder, unspecified: Secondary | ICD-10-CM | POA: Insufficient documentation

## 2021-05-13 MED ORDER — ACETAMINOPHEN-CODEINE #3 300-30 MG PO TABS: 1.0000 | ORAL_TABLET | Freq: Two times a day (BID) | ORAL | 1 refills | Status: DC | PRN

## 2021-05-13 NOTE — Patient Instructions (Signed)
Sacroiliac injection was performed today. A combination of numbing medicine (lidocaine) plus a cortisone medicine (betamethasone) was injected. The injection was done under x-ray guidance. This procedure has been performed to help reduce low back and buttocks pain as well as potentially hip pain. The duration of this injection is variable lasting from hours to  Months. It may repeated if needed. 

## 2021-05-13 NOTE — Progress Notes (Signed)
?  PROCEDURE RECORD ?[facility] ? ? ?Name: [name] Walker ?DOB:1960/07/09 ?MRN: [id number] ? ?Date:05/13/2021  Physician: Alger Simons, MD   ? ?Nurse/CMA: Jorja Loa MA ? ?Allergies:  ?Allergies  ?Allergen Reactions  ? Flexeril [Cyclobenzaprine] Swelling  ?  Tongue swells  ? Lisinopril Other (See Comments)  ?  ? Possible tongue swelling   ? Other Palpitations  ?  ALL MUSCLE RELAXERS-PER PATIENT  ? Robaxin [Methocarbamol] Other (See Comments)  ?  Tongue swelling  ? Contrast Media [Iodinated Contrast Media] Itching  ? Iodine Itching  ? Gabapentin Other (See Comments)  ? Lyrica [Pregabalin]   ?  Anxiety attacks  ? ? ?Consent Signed: No.  Is patient diabetic? No.  CBG today? N/A ? ?Pregnant: No. LMP: Patient's last menstrual period was 08/22/2010. (age 38-55) ? ?Anticoagulants: no ?Anti-inflammatory: no ?Antibiotics: no ? ?Procedure: Left Sacroiliac Steroid Injection  Position: Prone ?Start Time: 11:34 am  End Time: 11:39 am  Fluoro Time: 20 ? ?RN/CMA Gwyndolyn Saxon MA Jerline Pain MA   ?Time 11:10 am 11:42 am 11:49 am   ?BP 142/89 160/109 158/93   ?Pulse 70 69    ?Respirations 16 16    ?O2 Sat 98 98    ?S/S 6 6    ?Pain Level 10/10 0/10    ? ?D/C home with Son, patient A & O X 3, D/C instructions reviewed, and sits independently. ? ? ? ? ? ? ? ?

## 2021-05-13 NOTE — Progress Notes (Signed)
Left sacroiliac injection under fluoroscopic guidance  Indication: Left Low back and buttocks pain not relieved by medication management and other conservative care.  Informed consent was obtained after describing risks and benefits of the procedure with the patient, this includes bleeding, bruising, infection, paralysis and medication side effects. The patient wishes to proceed and has given written consent. The patient was placed in a prone position. The lumbar and sacral area was marked and prepped with Betadine. A 25-gauge 1-1/2 inch needle was inserted into the skin and subcutaneous tissue and 1 mL of 1% lidocaine was injected. Then a 25-gauge 3 inch spinal needle was inserted under fluoroscopic guidance into the left sacroiliac joint. AP and lateral images were utilized. Isovue 200x0.5 mL under live fluoroscopy demonstrated no intravascular uptake. Then a solution containing one ML of 6 mg per mLbetamethasone and 2 ML of 2% lidocaine MPF was injected x1.5 mL. Patient tolerated the procedure well. Post procedure instructions were given. Please see post procedure form. 

## 2021-05-16 ENCOUNTER — Telehealth: Payer: Self-pay | Admitting: Internal Medicine

## 2021-05-16 DIAGNOSIS — N63 Unspecified lump in unspecified breast: Secondary | ICD-10-CM

## 2021-05-16 NOTE — Telephone Encounter (Signed)
Patient calling in ? ?Patient says during her mammogram they noticed a lump in one of her breast & was advised that a ultrasound would be needed to look at the lump ? ?Patient is requesting referral for ultrasound be placed ? ?Please fu w/ patient when referral has been made [phone] ?

## 2021-05-17 NOTE — Telephone Encounter (Signed)
Patient notified and verbalizes understanding.

## 2021-05-17 NOTE — Telephone Encounter (Signed)
See below

## 2021-05-17 NOTE — Telephone Encounter (Signed)
I dont think simply an ultrasound is the way it is completely and appropriately evauated ? ?We normally order a diagnostic mammogram instead of screening mammogram to evaluated new suspicious lumps, so I will do this ?

## 2021-06-15 ENCOUNTER — Ambulatory Visit
Admission: RE | Admit: 2021-06-15 | Discharge: 2021-06-15 | Disposition: A | Discharge status: Home / Self Care | Payer: BC Managed Care – PPO | Source: Ambulatory Visit | Attending: Internal Medicine | Admitting: Internal Medicine

## 2021-06-15 ENCOUNTER — Ambulatory Visit: Payer: BC Managed Care – PPO

## 2021-06-15 DIAGNOSIS — N63 Unspecified lump in unspecified breast: Secondary | ICD-10-CM

## 2021-06-22 ENCOUNTER — Other Ambulatory Visit: Payer: Self-pay | Admitting: Internal Medicine

## 2021-06-24 ENCOUNTER — Encounter: Payer: Self-pay | Admitting: Physical Medicine & Rehabilitation

## 2021-06-24 ENCOUNTER — Encounter
Payer: BC Managed Care – PPO | Attending: Physical Medicine and Rehabilitation | Admitting: Physical Medicine & Rehabilitation

## 2021-06-24 VITALS — BP 131/88 | HR 80 | Ht 62.0 in | Wt 158.8 lb

## 2021-06-24 DIAGNOSIS — M259 Joint disorder, unspecified: Secondary | ICD-10-CM | POA: Insufficient documentation

## 2021-06-24 NOTE — Patient Instructions (Signed)

## 2021-06-24 NOTE — Progress Notes (Signed)
? ?Subjective:  ? ? Patient ID: [name] Walker, female    DOB: March 08, 1960, 61 y.o.   MRN: [id number] ? ?HPI ?61 year old female with primary complaint of low back pain going into the lower extremities.  She complains that her pain is sharp and stabbing 8 out of 10 pain is worse during morning evening and nighttime hours better at the daytime sleep is fair the pain increases with walking bending sitting standing improves with laying down.  She does get some relief with Tylenol with codeine. ?The patient underwent left sacroiliac joint injection under fluoroscopic guidance on 05/13/2021 which provided 100% pain relief for 2 days.  We discussed the significance of this. ?She has had developed no new symptoms since that time she continues to primarily left low back and buttock pain. ?Pain Inventory ?Average Pain 8 ?Pain Right Now 9 ?My pain is sharp and stabbing ? ?In the last 24 hours, has pain interfered with the following? ?General activity 7 ?Relation with others 10 ?Enjoyment of life 1 ?What TIME of day is your pain at its worst? morning , evening, and night ?Sleep (in general) Fair ? ?Pain is worse with: walking, bending, sitting, inactivity, standing, and some activites ?Pain improves with: rest, heat/ice, and medication ?Relief from Meds: 6 ? ?Family History  ?Problem Relation Age of Onset  ? Colon cancer Sister   ?     dx in her 11s  ? Diabetes Mother   ? Colon cancer Father   ?     dx in his 22s  ? Prostate cancer Father   ? Breast cancer Maternal Aunt   ?     62's  ? Esophageal cancer Neg Hx   ? Stomach cancer Neg Hx   ? ?Social History  ? ?Socioeconomic History  ? Marital status: Legally Separated  ?  Spouse name: Romilda Joy  ? Number of children: 3  ? Years of education: Not on file  ? Highest education level: Not on file  ?Occupational History  ? Occupation: Ship broker  school bus driver  ?Tobacco Use  ? Smoking status: Every Day  ?  Packs/day: 0.50  ?  Years: 20.00  ?  Pack years: 10.00  ?  Types: Cigarettes   ? Smokeless tobacco: Never  ?Vaping Use  ? Vaping Use: Never used  ?Substance and Sexual Activity  ? Alcohol use: No  ? Drug use: No  ? Sexual activity: Not Currently  ?  Birth control/protection: Post-menopausal, Surgical  ?  Comment: 1st intercourse 61 yo-Fewer than 5 partners-BTL  ?Other Topics Concern  ? Not on file  ?Social History Narrative  ? Not on file  ? ?Social Determinants of Health  ? ?Financial Resource Strain: Not on file  ?Food Insecurity: Not on file  ?Transportation Needs: Not on file  ?Physical Activity: Not on file  ?Stress: Not on file  ?Social Connections: Not on file  ? ?Past Surgical History:  ?Procedure Laterality Date  ? ANTERIOR CERVICAL DECOMP/DISCECTOMY FUSION N/A 04/07/2016  ? Procedure: Cervical two-three Anterior cervical decompression/discectomy/fusion;  Surgeon: Ashok Pall, MD;  Location: [place];  Service: Neurosurgery;  Laterality: N/A;  ? BACK SURGERY  2015  ? lower back  ? EXTRACORPOREAL SHOCK WAVE LITHOTRIPSY Left 09/28/2016  ? Procedure: LEFT EXTRACORPOREAL SHOCK WAVE LITHOTRIPSY (ESWL);  Surgeon: Festus Aloe, MD;  Location: [facility];  Service: Urology;  Laterality: Left;  ? EXTRACORPOREAL SHOCK WAVE LITHOTRIPSY Right 04/08/2018  ? Procedure: EXTRACORPOREAL SHOCK WAVE LITHOTRIPSY (ESWL);  Surgeon: Ardis Hughs,  MD;  Location: [facility];  Service: Urology;  Laterality: Right;  ? KIDNEY SURGERY    ? LITHOTRIPSY  05/2016  ? TUBAL LIGATION    ? ?Past Surgical History:  ?Procedure Laterality Date  ? ANTERIOR CERVICAL DECOMP/DISCECTOMY FUSION N/A 04/07/2016  ? Procedure: Cervical two-three Anterior cervical decompression/discectomy/fusion;  Surgeon: Ashok Pall, MD;  Location: [place];  Service: Neurosurgery;  Laterality: N/A;  ? BACK SURGERY  2015  ? lower back  ? EXTRACORPOREAL SHOCK WAVE LITHOTRIPSY Left 09/28/2016  ? Procedure: LEFT EXTRACORPOREAL SHOCK WAVE LITHOTRIPSY (ESWL);  Surgeon: Festus Aloe, MD;  Location[facility];  Service: Urology;  Laterality: Left;  ?  EXTRACORPOREAL SHOCK WAVE LITHOTRIPSY Right 04/08/2018  ? Procedure: EXTRACORPOREAL SHOCK WAVE LITHOTRIPSY (ESWL);  Surgeon: Ardis Hughs, MD;  Location[facility];  Service: Urology;  Laterality: Right;  ? KIDNEY SURGERY    ? LITHOTRIPSY  05/2016  ? TUBAL LIGATION    ? ?Past Medical History:  ?Diagnosis Date  ? ACHILLES TENDINITIS   ? ALLERGIC RHINITIS   ? Allergy   ? Anginal pain ([place]) 04/04/2016  ? Pt currently feels like she is having muscle spasms and aching in her chest  ? Anxiety   ? Asthma   ? Chronic kidney disease   ? Depression   ? GERD   ? History of kidney stones   ? HYPERTENSION   ? LATERAL EPICONDYLITIS, RIGHT   ? LOW BACK PAIN   ? Plantar fascial fibromatosis   ? Stress   ? SUBACROMIAL BURSITIS, RIGHT   ? Thoracic scoliosis 10/01/2015  ? TIA (transient ischemic attack)   ? ?BP 131/88   Pulse 80   Ht '5\' 2"'$  (1.575 m)   Wt 158 lb 12.8 oz (72 kg)   LMP 08/22/2010   SpO2 97%   BMI 29.04 kg/m?  ? ?Opioid Risk Score:   ?Fall Risk Score:  `1 ? ?Depression screen PHQ 2/9 ? ? ?  06/24/2021  ? 12:54 PM 05/13/2021  ? 11:09 AM 03/31/2021  ? 10:48 AM 02/10/2021  ? 11:24 AM 06/30/2020  ? 11:34 AM 06/30/2020  ? 10:56 AM 05/11/2020  ? 10:14 AM  ?Depression screen PHQ 2/9  ?Decreased Interest 0 '1 1 3 '$ 0 1 0  ?Down, Depressed, Hopeless 0 '1 1 1 1 1 '$ 0  ?PHQ - 2 Score 0 '2 2 4 1 2 '$ 0  ?Altered sleeping    2  0   ?Tired, decreased energy    0  0   ?Change in appetite    1  0   ?Feeling bad or failure about yourself     0  0   ?Trouble concentrating    0  0   ?Moving slowly or fidgety/restless    3  1   ?Suicidal thoughts    0  0   ?PHQ-9 Score    10  3   ?Difficult doing work/chores    Somewhat difficult     ?  ? ?Review of Systems  ?Constitutional: Negative.   ?HENT: Negative.    ?Eyes: Negative.   ?Respiratory: Negative.    ?Cardiovascular: Negative.   ?Gastrointestinal: Negative.   ?Endocrine: Negative.   ?Genitourinary: Negative.   ?Musculoskeletal:  Positive for back pain.  ?Skin: Negative.   ?Allergic/Immunologic:  Negative.   ?Neurological: Negative.   ?Hematological: Negative.   ?Psychiatric/Behavioral: Negative.    ? ?   ?Objective:  ? Physical Exam ?Vitals and nursing note reviewed.  ?Constitutional:   ?  Appearance: She is normal weight.  ?HENT:  ?   Head: Normocephalic and atraumatic.  ?Eyes:  ?   Extraocular Movements: Extraocular movements intact.  ?   Conjunctiva/sclera: Conjunctivae normal.  ?   Pupils: Pupils are equal, round, and reactive to light.  ?Musculoskeletal:  ?   Comments: Lumbar spine has no tenderness palpation about the waist.  There is tenderness at the left PSIS greater than right PSIS ?Positive thigh thrust test on the left side ?Positive Faber's on the left side ?Positive sacral compression test on the side. ?Negative distraction test  ?Skin: ?   General: Skin is warm and dry.  ?Neurological:  ?   General: No focal deficit present.  ?   Mental Status: She is alert and oriented to person, place, and time.  ?Psychiatric:     ?   Mood and Affect: Mood normal.     ?   Behavior: Behavior normal.  ? ? ? ?Negative straight leg raise bilaterally ?Motor strength is 5/5 bilateral hip flexor knee extensor ankle dorsiflexor ?Ambulates without assistive device ?Notes of toe drag or knee instability ? ?   ?Assessment & Plan:  ? ?#1.  Left sacroiliac pain temporary relief with intra-articular injection ?Discussed other treatment options, if no relief with exercises ?She is not case management and has had allergies to multiple medications including Flexeril Robaxin gabapentin and Lyrica. ?We will schedule for left sacroiliac nerve block under fluoroscopic guidance.  If she gets 50% or better relief with this would recommend sacroiliac radiofrequency neurotomy  ?

## 2021-07-31 ENCOUNTER — Other Ambulatory Visit: Payer: Self-pay | Admitting: Internal Medicine

## 2021-07-31 ENCOUNTER — Other Ambulatory Visit: Payer: Self-pay | Admitting: Obstetrics & Gynecology

## 2021-07-31 NOTE — Telephone Encounter (Signed)
Please refill as per office routine med refill policy (all routine meds to be refilled for 3 mo or monthly (per pt preference) up to one year from last visit, then month to month grace period for 3 mo, then further med refills will have to be denied) ? ?

## 2021-08-11 ENCOUNTER — Encounter: Payer: Self-pay | Admitting: Physical Medicine & Rehabilitation

## 2021-08-11 ENCOUNTER — Telehealth: Payer: Self-pay

## 2021-08-11 ENCOUNTER — Encounter
Payer: BC Managed Care – PPO | Attending: Physical Medicine and Rehabilitation | Admitting: Physical Medicine & Rehabilitation

## 2021-08-11 VITALS — BP 136/87 | HR 79 | Temp 98.1°F | Ht 62.0 in | Wt 156.0 lb

## 2021-08-11 DIAGNOSIS — M259 Joint disorder, unspecified: Secondary | ICD-10-CM

## 2021-08-11 MED ORDER — DIAZEPAM 10 MG PO TABS: 10.0000 mg | ORAL_TABLET | Freq: Four times a day (QID) | ORAL | 0 refills | Status: DC | PRN

## 2021-08-11 MED ORDER — ACETAMINOPHEN-CODEINE 300-30 MG PO TABS: 1.0000 | ORAL_TABLET | Freq: Two times a day (BID) | ORAL | 2 refills | Status: DC | PRN

## 2021-08-11 NOTE — Progress Notes (Signed)
  PROCEDURE RECORD [facility]   Name: MARIALENA WOLLEN DOB:[birth year] MRN: [id number]  Date:08/11/2021  Physician: Alysia Penna, MD    Nurse/CMA: Jorja Loa MA  Allergies:  Allergies  Allergen Reactions   Flexeril [Cyclobenzaprine] Swelling    Tongue swells   Lisinopril Other (See Comments)    ? Possible tongue swelling    Other Palpitations    ALL MUSCLE RELAXERS-PER PATIENT   Robaxin [Methocarbamol] Other (See Comments)    Tongue swelling   Contrast Media [Iodinated Contrast Media] Itching   Iodine Itching   Gabapentin Other (See Comments)   Lyrica [Pregabalin]     Anxiety attacks    Consent Signed: Yes.    Is patient diabetic? No.  CBG today? N/A  Pregnant: No. LMP: Patient's last menstrual period was 08/22/2010. (age 8-55)  Anticoagulants: no Anti-inflammatory: no Antibiotics: no  Procedure: Left Sacroiliac Steroid Injection  Position: Prone Start Time: 12:32 pm  End Time: 12:41 pm  Fluoro Time: 1:01  RN/CMA Gwyndolyn Saxon MA    Time 12:16 PM 12: pm    BP 136/87 146/93    Pulse 79 79    Respirations 16 16    O2 Sat 98 98    S/S 6 6    Pain Level 10/10 4/10     D/C home with Daughter, patient A & O X 3, D/C instructions reviewed, and sits independently.

## 2021-08-11 NOTE — Telephone Encounter (Signed)
Okay per Dr. Letta Pate for procedure.

## 2021-08-11 NOTE — Progress Notes (Signed)
L5 dorsal ramus S1-S2-S3 lateral branch blocks under fluoroscopic guidance Left side  Informed consent was obtained after describing risks and benefits of the procedure with patient these include bleeding bruising and infection.  He elects to proceed and has given written consent.  Patient placed prone on fluoroscopy table Betadine prep sterile drape a 25-gauge 1.5 inch needle was used to anesthetize skin and subcu tissue with 1% lidocaine 1 cc into each of 4 sites.  Then a 22-gauge 3.5" needle was inserted under fluoroscopic guidance for starting the S1 SAP sacral ala junction.  Bone contact made.  Isovue 200 x 0.5 mL demonstrated no intravascular uptake then 0.5 mL of 2% lidocaine was injected.  Then the lateral aspect of the S1, S2, S3 foramen was targeted.  Bone contact made out, Isovue-200 times 0.5 mL demonstrated no nerve root or intravascular uptake within 0.5 mL of 2% lidocaine solution was injected after negative drawback for blood.  Patient tolerated procedure well.  Postinjection instructions given. 

## 2021-08-11 NOTE — Patient Instructions (Signed)
Sacroiliac nerve block injections performed today. ? ?The injection was done under x-ray guidance with contrast enhancement. ?This procedure has been performed to help reduce low back and buttocks pain as well as potentially hip pain. ?The duration of this injection is variable lasting from hours to  weeks ?If this injection produces a >50%  short term relief of pain, then a radiofrequency ablation of these same nerves may result in a 6mo + pain relief  ?

## 2021-08-18 ENCOUNTER — Ambulatory Visit (INDEPENDENT_AMBULATORY_CARE_PROVIDER_SITE_OTHER): Payer: BC Managed Care – PPO | Admitting: Internal Medicine

## 2021-08-18 ENCOUNTER — Encounter: Payer: Self-pay | Admitting: Internal Medicine

## 2021-08-18 VITALS — BP 120/76 | HR 69 | Temp 97.8°F | Ht 62.0 in | Wt 158.0 lb

## 2021-08-18 DIAGNOSIS — E538 Deficiency of other specified B group vitamins: Secondary | ICD-10-CM

## 2021-08-18 DIAGNOSIS — R739 Hyperglycemia, unspecified: Secondary | ICD-10-CM

## 2021-08-18 DIAGNOSIS — E611 Iron deficiency: Secondary | ICD-10-CM

## 2021-08-18 DIAGNOSIS — K219 Gastro-esophageal reflux disease without esophagitis: Secondary | ICD-10-CM

## 2021-08-18 DIAGNOSIS — E785 Hyperlipidemia, unspecified: Secondary | ICD-10-CM | POA: Diagnosis not present

## 2021-08-18 DIAGNOSIS — F17200 Nicotine dependence, unspecified, uncomplicated: Secondary | ICD-10-CM

## 2021-08-18 DIAGNOSIS — I10 Essential (primary) hypertension: Secondary | ICD-10-CM

## 2021-08-18 DIAGNOSIS — Z0001 Encounter for general adult medical examination with abnormal findings: Secondary | ICD-10-CM | POA: Diagnosis not present

## 2021-08-18 DIAGNOSIS — E559 Vitamin D deficiency, unspecified: Secondary | ICD-10-CM | POA: Diagnosis not present

## 2021-08-18 LAB — BASIC METABOLIC PANEL
BUN: 12 mg/dL (ref 6–23)
CO2: 29 mEq/L (ref 19–32)
Calcium: 9.8 mg/dL (ref 8.4–10.5)
Chloride: 108 mEq/L (ref 96–112)
Creatinine, Ser: 0.67 mg/dL (ref 0.40–1.20)
GFR: 94.19 mL/min (ref >60.00)
Glucose, Bld: 101 mg/dL — ABNORMAL HIGH (ref 70–99)
Potassium: 3.3 mEq/L — ABNORMAL LOW (ref 3.5–5.1)
Sodium: 142 mEq/L (ref 135–145)

## 2021-08-18 LAB — HEPATIC FUNCTION PANEL
ALT: 21 U/L (ref 0–35)
AST: 20 U/L (ref 0–37)
Albumin: 4.3 g/dL (ref 3.5–5.2)
Alkaline Phosphatase: 75 U/L (ref 39–117)
Bilirubin, Direct: 0.1 mg/dL (ref 0.0–0.3)
Total Bilirubin: 0.5 mg/dL (ref 0.2–1.2)
Total Protein: 7.1 g/dL (ref 6.0–8.3)

## 2021-08-18 LAB — VITAMIN B12: Vitamin B-12: 301 pg/mL (ref 211–911)

## 2021-08-18 LAB — CBC WITH DIFFERENTIAL/PLATELET
Basophils Absolute: 0 10*3/uL (ref 0.0–0.1)
Basophils Relative: 0.4 % (ref 0.0–3.0)
Eosinophils Absolute: 0.1 10*3/uL (ref 0.0–0.7)
Eosinophils Relative: 1.7 % (ref 0.0–5.0)
HCT: 44.9 % (ref 36.0–46.0)
Hemoglobin: 14.6 g/dL (ref 12.0–15.0)
Lymphocytes Relative: 44.5 % (ref 12.0–46.0)
Lymphs Abs: 2.3 10*3/uL (ref 0.7–4.0)
MCHC: 32.4 g/dL (ref 30.0–36.0)
MCV: 88.9 fl (ref 78.0–100.0)
Monocytes Absolute: 0.4 10*3/uL (ref 0.1–1.0)
Monocytes Relative: 7 % (ref 3.0–12.0)
Neutro Abs: 2.4 10*3/uL (ref 1.4–7.7)
Neutrophils Relative %: 46.4 % (ref 43.0–77.0)
Platelets: 125 10*3/uL — ABNORMAL LOW (ref 150.0–400.0)
RBC: 5.05 Mil/uL (ref 3.87–5.11)
RDW: 14.7 % (ref 11.5–15.5)
WBC: 5.1 10*3/uL (ref 4.0–10.5)

## 2021-08-18 LAB — LIPID PANEL
Cholesterol: 154 mg/dL (ref 0–200)
HDL: 36.1 mg/dL — ABNORMAL LOW (ref >39.00)
NonHDL: 117.6
Total CHOL/HDL Ratio: 4
Triglycerides: 250 mg/dL — ABNORMAL HIGH (ref 0.0–149.0)
VLDL: 50 mg/dL — ABNORMAL HIGH (ref 0.0–40.0)

## 2021-08-18 LAB — URINALYSIS, ROUTINE W REFLEX MICROSCOPIC
Bilirubin Urine: NEGATIVE
Hgb urine dipstick: NEGATIVE
Ketones, ur: NEGATIVE
Leukocytes,Ua: NEGATIVE
Nitrite: NEGATIVE
Specific Gravity, Urine: 1.025 (ref 1.000–1.030)
Total Protein, Urine: NEGATIVE
Urine Glucose: NEGATIVE
Urobilinogen, UA: 2 — AB (ref 0.0–1.0)
pH: 5.5 (ref 5.0–8.0)

## 2021-08-18 LAB — IBC PANEL
Iron: 82 ug/dL (ref 42–145)
Saturation Ratios: 23.8 % (ref 20.0–50.0)
TIBC: 344.4 ug/dL (ref 250.0–450.0)
Transferrin: 246 mg/dL (ref 212.0–360.0)

## 2021-08-18 LAB — VITAMIN D 25 HYDROXY (VIT D DEFICIENCY, FRACTURES): VITD: 47.67 ng/mL (ref 30.00–100.00)

## 2021-08-18 LAB — LDL CHOLESTEROL, DIRECT: Direct LDL: 82 mg/dL

## 2021-08-18 LAB — TSH: TSH: 0.63 u[IU]/mL (ref 0.35–5.50)

## 2021-08-18 LAB — HEMOGLOBIN A1C: Hgb A1c MFr Bld: 5.3 % (ref 4.6–6.5)

## 2021-08-18 MED ORDER — PANTOPRAZOLE SODIUM 40 MG PO TBEC: 40.0000 mg | DELAYED_RELEASE_TABLET | Freq: Two times a day (BID) | ORAL | 3 refills | Status: DC

## 2021-08-18 MED ORDER — ALENDRONATE SODIUM 70 MG PO TABS: 70.0000 mg | ORAL_TABLET | ORAL | 3 refills | Status: DC

## 2021-08-18 MED ORDER — ATORVASTATIN CALCIUM 10 MG PO TABS: 10.0000 mg | ORAL_TABLET | Freq: Every day | ORAL | 3 refills | Status: DC

## 2021-08-18 MED ORDER — TRAZODONE HCL 100 MG PO TABS: 100.0000 mg | ORAL_TABLET | Freq: Every day | ORAL | 1 refills | Status: DC

## 2021-08-18 MED ORDER — LOSARTAN POTASSIUM 50 MG PO TABS: 50.0000 mg | ORAL_TABLET | Freq: Every day | ORAL | 3 refills | Status: DC

## 2021-08-18 MED ORDER — CITALOPRAM HYDROBROMIDE 40 MG PO TABS: 40.0000 mg | ORAL_TABLET | Freq: Every day | ORAL | 3 refills | Status: DC

## 2021-08-18 MED ORDER — ALBUTEROL SULFATE HFA 108 (90 BASE) MCG/ACT IN AERS: INHALATION_SPRAY | RESPIRATORY_TRACT | 5 refills | Status: DC

## 2021-08-18 MED ORDER — AMLODIPINE BESYLATE 5 MG PO TABS: 5.0000 mg | ORAL_TABLET | Freq: Every day | ORAL | 3 refills | Status: DC

## 2021-08-18 MED ORDER — LORAZEPAM 0.5 MG PO TABS: 0.5000 mg | ORAL_TABLET | Freq: Two times a day (BID) | ORAL | 2 refills | Status: DC | PRN

## 2021-08-18 NOTE — Assessment & Plan Note (Signed)
Last vitamin D Lab Results  Component Value Date   VD25OH 20.29 (L) 06/30/2020   Low, to start oral replacement

## 2021-08-18 NOTE — Progress Notes (Unsigned)
Patient ID: [name] Walker, female   DOB: 1960/12/16, 61 y.o.   MRN: [id number]         Chief Complaint:: wellness exam and Annual Exam  ***       HPI:  [name] Walker is a 61 y.o. female here for wellness exam; plans to call for pap with GYN soon.                          Also s/p left low back coritsone now improved.     Wt Readings from Last 3 Encounters:  08/18/21 158 lb (71.7 kg)  08/11/21 156 lb (70.8 kg)  06/24/21 158 lb 12.8 oz (72 kg)   BP Readings from Last 3 Encounters:  08/18/21 120/76  08/11/21 136/87  06/24/21 131/88   Immunization History  Administered Date(s) Administered   Influenza, Quadrivalent, Recombinant, Inj, Pf 12/31/2018   Influenza,inj,Quad PF,6+ Mos 03/02/2020   PFIZER(Purple Top)SARS-COV-2 Vaccination 04/26/2019, 05/17/2019   Pneumococcal Polysaccharide-23 01/20/2019   Tdap 11/02/2019   Zoster Recombinat (Shingrix) 01/20/2019, 03/22/2019   Health Maintenance Due  Topic Date Due   PAP SMEAR-Modifier  10/02/2020      Past Medical History:  Diagnosis Date   ACHILLES TENDINITIS    ALLERGIC RHINITIS    Allergy    Anginal pain ([place]) 04/04/2016   Pt currently feels like she is having muscle spasms and aching in her chest   Anxiety    Asthma    Chronic kidney disease    Depression    GERD    History of kidney stones    HYPERTENSION    LATERAL EPICONDYLITIS, RIGHT    LOW BACK PAIN    Plantar fascial fibromatosis    Stress    SUBACROMIAL BURSITIS, RIGHT    Thoracic scoliosis 10/01/2015   TIA (transient ischemic attack)    Past Surgical History:  Procedure Laterality Date   ANTERIOR CERVICAL DECOMP/DISCECTOMY FUSION N/A 04/07/2016   Procedure: Cervical two-three Anterior cervical decompression/discectomy/fusion;  Surgeon: Ashok Pall, MD;  Location: [place];  Service: Neurosurgery;  Laterality: N/A;   BACK SURGERY  2015   lower back   EXTRACORPOREAL SHOCK WAVE LITHOTRIPSY Left 09/28/2016   Procedure: LEFT EXTRACORPOREAL SHOCK WAVE  LITHOTRIPSY (ESWL);  Surgeon: Festus Aloe, MD;  Location[facility];  Service: Urology;  Laterality: Left;   EXTRACORPOREAL SHOCK WAVE LITHOTRIPSY Right 04/08/2018   Procedure: EXTRACORPOREAL SHOCK WAVE LITHOTRIPSY (ESWL);  Surgeon: Ardis Hughs, MD;  Location[facility];  Service: Urology;  Laterality: Right;   KIDNEY SURGERY     LITHOTRIPSY  05/2016   TUBAL LIGATION      reports that she has been smoking cigarettes. She has a 10.00 pack-year smoking history. She has never used smokeless tobacco. She reports that she does not drink alcohol and does not use drugs. family history includes Breast cancer in her maternal aunt; Colon cancer in her father and sister; Diabetes in her mother; Prostate cancer in her father. Allergies  Allergen Reactions   Flexeril [Cyclobenzaprine] Swelling    Tongue swells   Lisinopril Other (See Comments)    ? Possible tongue swelling    Other Palpitations    ALL MUSCLE RELAXERS-PER PATIENT   Robaxin [Methocarbamol] Other (See Comments)    Tongue swelling   Contrast Media [Iodinated Contrast Media] Itching   Iodine Itching   Gabapentin Other (See Comments)   Lyrica [Pregabalin]     Anxiety attacks   Current Outpatient Medications on File  Prior to Visit  Medication Sig Dispense Refill   acetaminophen-codeine (TYLENOL #3) 300-30 MG tablet Take 1 tablet by mouth every 12 (twelve) hours as needed for moderate pain. 60 tablet 2   albuterol (VENTOLIN HFA) 108 (90 Base) MCG/ACT inhaler INHALE 2 PUFFS BY MOUTH EVERY 6 HOURS AS NEEDED FOR WHEEZING FOR SHORTNESS OF BREATH 18 g 0   alendronate (FOSAMAX) 70 MG tablet Take 70 mg by mouth once a week.     amLODipine (NORVASC) 5 MG tablet Take 1 tablet (5 mg total) by mouth daily. Overdue for annual appt w/ labs must see provider for future refills 30 tablet 0   aspirin EC 81 MG tablet Take 1 tablet (81 mg total) by mouth daily. Swallow whole. 30 tablet 11   atorvastatin (LIPITOR) 10 MG tablet Take 1 tablet (10 mg  total) by mouth daily. Overdue for annual appt w/ labs must see provider for future refills 30 tablet 0   celecoxib (CELEBREX) 200 MG capsule Take 1 capsule (200 mg total) by mouth 2 (two) times daily. 60 capsule 11   cetirizine (ZYRTEC) 10 MG tablet Take 10 mg by mouth daily as needed for allergies.     Cholecalciferol (THERA-D 2000) 50 MCG (2000 UT) TABS 1 tab by mouth once daily 30 tablet 99   citalopram (CELEXA) 40 MG tablet Take 1 tablet (40 mg total) by mouth daily. 90 tablet 3   diazepam (VALIUM) 10 MG tablet Take 1 tablet (10 mg total) by mouth every 6 (six) hours as needed for anxiety. For Procedure  on 10/07/2021.  Take the  10 MG tablet by mouth, 30 minutes before the procedure. Patient must have a driver while taking the medication.  NO REFILL. 1 tablet 0   glycopyrrolate (ROBINUL) 2 MG tablet Take 1 tablet (2 mg total) by mouth 2 (two) times daily. 60 tablet 11   ketorolac (TORADOL) 60 MG/2ML SOLN injection Inject 60 mg into the muscle once.     LORazepam (ATIVAN) 0.5 MG tablet Take 1 tablet by mouth twice daily as needed for anxiety 60 tablet 2   losartan (COZAAR) 50 MG tablet Take by mouth.     naloxone (NARCAN) nasal spray 4 mg/0.1 mL ADMINISTER A SINGLE SPRAY IN ONE NOSTRIL UPON SIGNS OF OPIOID OVERDOSE. CALL 911. REPEAT AFTER 3 MINUTES IF NO RESPONSE.     ondansetron (ZOFRAN) 8 MG tablet Take 1 tablet (8 mg total) by mouth every 8 (eight) hours as needed for nausea or vomiting. 20 tablet 0   pantoprazole (PROTONIX) 40 MG tablet Take 1 tablet (40 mg total) by mouth 2 (two) times daily before a meal. 60 tablet 5   traZODone (DESYREL) 100 MG tablet Take 1 tablet (100 mg total) by mouth at bedtime. 90 tablet 1   triamcinolone acetonide (KENALOG-40) 40 MG/ML injection Inject 1 mL into the muscle once.     venlafaxine XR (EFFEXOR-XR) 150 MG 24 hr capsule      No current facility-administered medications on file prior to visit.        ROS:  All others reviewed and negative.  Objective         PE:  BP 120/76 (BP Location: Right Arm, Patient Position: Sitting, Cuff Size: Large)   Pulse 69   Temp 97.8 F (36.6 C) (Oral)   Ht '5\' 2"'$  (1.575 m)   Wt 158 lb (71.7 kg)   LMP 08/22/2010   SpO2 98%   BMI 28.90 kg/m  Constitutional: Pt appears in NAD               HENT: Head: NCAT.                Right Ear: External ear normal.                 Left Ear: External ear normal.                Eyes: . Pupils are equal, round, and reactive to light. Conjunctivae and EOM are normal               Nose: without d/c or deformity               Neck: Neck supple. Gross normal ROM               Cardiovascular: Normal rate and regular rhythm.                 Pulmonary/Chest: Effort normal and breath sounds without rales or wheezing.                Abd:  Soft, NT, ND, + BS, no organomegaly               Neurological: Pt is alert. At baseline orientation, motor grossly intact               Skin: Skin is warm. No rashes, no other new lesions, LE edema - ***               Psychiatric: Pt behavior is normal without agitation   Micro: none  Cardiac tracings I have personally interpreted today:  none  Pertinent Radiological findings (summarize): none   Lab Results  Component Value Date   WBC 4.9 06/30/2020   HGB 14.3 06/30/2020   HCT 43.9 06/30/2020   PLT 135.0 (L) 06/30/2020   GLUCOSE 91 06/30/2020   CHOL 122 06/30/2020   TRIG 199.0 (H) 06/30/2020   HDL 36.50 (L) 06/30/2020   LDLDIRECT 117.0 09/06/2017   LDLCALC 46 06/30/2020   ALT 22 06/30/2020   AST 19 06/30/2020   NA 143 06/30/2020   K 3.8 06/30/2020   CL 110 06/30/2020   CREATININE 0.59 06/30/2020   BUN 9 06/30/2020   CO2 27 06/30/2020   TSH 0.53 06/30/2020   INR 0.9 09/30/2019   HGBA1C 5.3 06/30/2020   Assessment/Plan:  [name] Walker is a 61 y.o. Black or African American [2] female with  has a past medical history of ACHILLES TENDINITIS, ALLERGIC RHINITIS, Allergy, Anginal pain ([place]) (04/04/2016),  Anxiety, Asthma, Chronic kidney disease, Depression, GERD, History of kidney stones, HYPERTENSION, LATERAL EPICONDYLITIS, RIGHT, LOW BACK PAIN, Plantar fascial fibromatosis, Stress, SUBACROMIAL BURSITIS, RIGHT, Thoracic scoliosis (10/01/2015), and TIA (transient ischemic attack).  No problem-specific Assessment & Plan notes found for this encounter.  Followup: No follow-ups on file.  Cathlean Cower, MD 08/18/2021 1:23 PM [facility]

## 2021-08-18 NOTE — Patient Instructions (Signed)
Please continue all other medications as before, and refills have been done if requested.  Please have the pharmacy call with any other refills you may need.  Please continue your efforts at being more active, low cholesterol diet, and weight control.  You are otherwise up to date with prevention measures today.  Please keep your appointments with your specialists as you may have planned  Please take OTC Vitamin D3 at 2000 units per day, indefinitely  Please go to the LAB at the blood drawing area for the tests to be done  You will be contacted by phone if any changes need to be made immediately.  Otherwise, you will receive a letter about your results with an explanation, but please check with MyChart first.  Please remember to sign up for MyChart if you have not done so, as this will be important to you in the future with finding out test results, communicating by private email, and scheduling acute appointments online when needed.  Please make an Appointment to return for your 1 year visit, or sooner if needed

## 2021-08-21 ENCOUNTER — Encounter: Payer: Self-pay | Admitting: Internal Medicine

## 2021-10-07 ENCOUNTER — Encounter: Payer: Self-pay | Admitting: Physical Medicine & Rehabilitation

## 2021-10-07 ENCOUNTER — Encounter
Payer: BC Managed Care – PPO | Attending: Physical Medicine and Rehabilitation | Admitting: Physical Medicine & Rehabilitation

## 2021-10-07 VITALS — BP 149/85 | HR 67 | Ht 62.0 in | Wt 153.2 lb

## 2021-10-07 DIAGNOSIS — M533 Sacrococcygeal disorders, not elsewhere classified: Secondary | ICD-10-CM | POA: Diagnosis present

## 2021-10-07 NOTE — Patient Instructions (Signed)
Sacroiliac injection was performed today. A combination of numbing medicine (lidocaine) plus a cortisone medicine (betamethasone) was injected. The injection was done under x-ray guidance. This procedure has been performed to help reduce low back and buttocks pain as well as potentially hip pain. The duration of this injection is variable lasting from hours to  Months. It may repeated if needed. 

## 2021-10-07 NOTE — Progress Notes (Signed)
  PROCEDURE RECORD [facility]   Name: JAID QUIRION DOB:January 28, 1961 MRN: [id number]  Date:10/07/2021  Physician: Alysia Penna, MD    Nurse/CMA: Yoceline Bazar RMA  Allergies:  Allergies  Allergen Reactions   Flexeril [Cyclobenzaprine] Swelling    Tongue swells   Lisinopril Other (See Comments)    ? Possible tongue swelling    Other Palpitations    ALL MUSCLE RELAXERS-PER PATIENT   Robaxin [Methocarbamol] Other (See Comments)    Tongue swelling   Contrast Media [Iodinated Contrast Media] Itching   Iodine Itching   Gabapentin Other (See Comments)   Lyrica [Pregabalin]     Anxiety attacks    Consent Signed: Yes.    Is patient diabetic? No.  CBG today? .  Pregnant: No. LMP: Patient's last menstrual period was 08/22/2010. (age [age])  Anticoagulants: no Anti-inflammatory: no Antibiotics: no  Procedure: Right Sacroiliac Injection  Position: Prone Start Time: 10:45am  End Time: 10:49am  Fluoro Time: 15  RN/CMA Vani Gunner RMA Kron Everton RMA    Time 10:35 10:49    BP 134/89 149/85    Pulse 69 67    Respirations 16 16    O2 Sat 97 97    S/S 6 6    Pain Level 9/10 0/10     D/C home with son, patient A & O X 3, D/C instructions reviewed, and sits independently.

## 2021-10-07 NOTE — Progress Notes (Signed)

## 2021-10-17 ENCOUNTER — Telehealth: Payer: Self-pay | Admitting: *Deleted

## 2021-10-17 ENCOUNTER — Encounter: Payer: Self-pay | Admitting: Emergency Medicine

## 2021-10-17 ENCOUNTER — Ambulatory Visit: Payer: BC Managed Care – PPO | Admitting: Emergency Medicine

## 2021-10-17 VITALS — BP 136/88 | HR 73 | Temp 98.0°F | Ht 62.0 in | Wt 152.2 lb

## 2021-10-17 DIAGNOSIS — H6693 Otitis media, unspecified, bilateral: Secondary | ICD-10-CM | POA: Diagnosis not present

## 2021-10-17 DIAGNOSIS — H9203 Otalgia, bilateral: Secondary | ICD-10-CM | POA: Diagnosis not present

## 2021-10-17 DIAGNOSIS — B9689 Other specified bacterial agents as the cause of diseases classified elsewhere: Secondary | ICD-10-CM

## 2021-10-17 MED ORDER — HYDROCORTISONE-ACETIC ACID 1-2 % OT SOLN: 3.0000 [drp] | Freq: Three times a day (TID) | OTIC | 1 refills | Status: DC

## 2021-10-17 MED ORDER — TRAZODONE HCL 100 MG PO TABS: 150.0000 mg | ORAL_TABLET | Freq: Every evening | ORAL | 1 refills | Status: DC | PRN

## 2021-10-17 MED ORDER — AMOXICILLIN-POT CLAVULANATE 875-125 MG PO TABS: 1.0000 | ORAL_TABLET | Freq: Two times a day (BID) | ORAL | 0 refills | Status: AC

## 2021-10-17 NOTE — Patient Instructions (Signed)
Otitis Media, Adult  Otitis media is a condition in which the middle ear is red and swollen (inflamed) and full of fluid. The middle ear is the part of the ear that contains bones for hearing as well as air that helps send sounds to the brain. The condition usually goes away on its own. What are the causes? This condition is caused by a blockage in the eustachian tube. This tube connects the middle ear to the back of the nose. It normally allows air into the middle ear. The blockage is caused by fluid or swelling. Problems that can cause blockage include: A cold or infection that affects the nose, mouth, or throat. Allergies. An irritant, such as tobacco smoke. Adenoids that have become large. The adenoids are soft tissue located in the back of the throat, behind the nose and the roof of the mouth. Growth or swelling in the upper part of the throat, just behind the nose (nasopharynx). Damage to the ear caused by a change in pressure. This is called barotrauma. What increases the risk? You are more likely to develop this condition if you: Smoke or are exposed to tobacco smoke. Have an opening in the roof of your mouth (cleft palate). Have acid reflux. Have problems in your body's defense system (immune system). What are the signs or symptoms? Symptoms of this condition include: Ear pain. Fever. Problems with hearing. Being tired. Fluid leaking from the ear. Ringing in the ear. How is this treated? This condition can go away on its own within 3-5 days. But if the condition is caused by germs (bacteria) and does not go away on its own, or if it keeps coming back, your doctor may: Give you antibiotic medicines. Give you medicines for pain. Follow these instructions at home: Take over-the-counter and prescription medicines only as told by your doctor. If you were prescribed an antibiotic medicine, take it as told by your doctor. Do not stop taking it even if you start to feel better. Keep  all follow-up visits. Contact a doctor if: You have bleeding from your nose. There is a lump on your neck. You are not feeling better in 5 days. You feel worse instead of better. Get help right away if: You have pain that is not helped with medicine. You have swelling, redness, or pain around your ear. You get a stiff neck. You cannot move part of your face (paralysis). You notice that the bone behind your ear hurts when you touch it. You get a very bad headache. Summary Otitis media means that the middle ear is red, swollen, and full of fluid. This condition usually goes away on its own. If the problem does not go away, treatment may be needed. You may be given medicines to treat the infection or to treat your pain. If you were prescribed an antibiotic medicine, take it as told by your doctor. Do not stop taking it even if you start to feel better. Keep all follow-up visits. This information is not intended to replace advice given to you by your health care provider. Make sure you discuss any questions you have with your health care provider. Document Revised: 05/24/2020 Document Reviewed: 05/24/2020 Elsevier Patient Education  2023 Elsevier Inc.  

## 2021-10-17 NOTE — Progress Notes (Signed)
[name] Walker 61 y.o.   Chief Complaint  Patient presents with   Ear Pain    X 3 days    HISTORY OF PRESENT ILLNESS: Acute problem visit today.  Patient of Dr. Cathlean Cower. This is a 61 y.o. female complaining of bilateral ear pain for the last 3 days with sinus congestion.  No other associated symptoms. No other complaints or medical concerns today.  HPI   Prior to Admission medications   Medication Sig Start Date End Date Taking? Authorizing Provider  acetaminophen-codeine (TYLENOL #3) 300-30 MG tablet Take 1 tablet by mouth every 12 (twelve) hours as needed for moderate pain. 08/11/21  Yes Kirsteins, Luanna Salk, MD  albuterol (VENTOLIN HFA) 108 (90 Base) MCG/ACT inhaler INHALE 2 PUFFS BY MOUTH EVERY 6 HOURS AS NEEDED FOR WHEEZING FOR SHORTNESS OF BREATH 08/18/21  Yes Biagio Borg, MD  alendronate (FOSAMAX) 70 MG tablet Take 1 tablet (70 mg total) by mouth once a week. 08/18/21  Yes Biagio Borg, MD  amLODipine (NORVASC) 5 MG tablet Take 1 tablet (5 mg total) by mouth daily. 08/18/21  Yes Biagio Borg, MD  amoxicillin-clavulanate (AUGMENTIN) 875-125 MG tablet Take 1 tablet by mouth 2 (two) times daily for 7 days. 10/17/21 10/24/21 Yes SagardiaInes Bloomer, MD  aspirin EC 81 MG tablet Take 1 tablet (81 mg total) by mouth daily. Swallow whole. 09/15/19  Yes Biagio Borg, MD  atorvastatin (LIPITOR) 10 MG tablet Take 1 tablet (10 mg total) by mouth daily. 08/18/21  Yes Biagio Borg, MD  celecoxib (CELEBREX) 200 MG capsule Take 1 capsule (200 mg total) by mouth 2 (two) times daily. 08/11/20  Yes Biagio Borg, MD  cetirizine (ZYRTEC) 10 MG tablet Take 10 mg by mouth daily as needed for allergies.   Yes [provider]  Cholecalciferol (THERA-D 2000) 50 MCG (2000 UT) TABS 1 tab by mouth once daily 06/30/20  Yes Biagio Borg, MD  citalopram (CELEXA) 40 MG tablet Take 1 tablet (40 mg total) by mouth daily. 08/18/21  Yes Biagio Borg, MD  glycopyrrolate (ROBINUL) 2 MG tablet Take 1  tablet (2 mg total) by mouth 2 (two) times daily. 03/24/20  Yes Ladene Artist, MD  LORazepam (ATIVAN) 0.5 MG tablet Take 1 tablet (0.5 mg total) by mouth 2 (two) times daily as needed. for anxiety 08/18/21  Yes Biagio Borg, MD  losartan (COZAAR) 50 MG tablet Take 1 tablet (50 mg total) by mouth daily. 08/18/21  Yes Biagio Borg, MD  ondansetron (ZOFRAN) 8 MG tablet Take 1 tablet (8 mg total) by mouth every 8 (eight) hours as needed for nausea or vomiting. 01/02/20  Yes Marrian Salvage, FNP  pantoprazole (PROTONIX) 40 MG tablet Take 1 tablet (40 mg total) by mouth 2 (two) times daily before a meal. 08/18/21  Yes Biagio Borg, MD  traZODone (DESYREL) 100 MG tablet Take 1 tablet (100 mg total) by mouth at bedtime. 08/18/21  Yes Biagio Borg, MD  naloxone [facility]) nasal spray 4 mg/0.1 mL ADMINISTER A SINGLE SPRAY IN ONE NOSTRIL UPON SIGNS OF OPIOID OVERDOSE. CALL 911. REPEAT AFTER 3 MINUTES IF NO RESPONSE. Patient not taking: Reported on 10/17/2021 06/04/20   [provider]    Allergies  Allergen Reactions   Flexeril [Cyclobenzaprine] Swelling    Tongue swells   Lisinopril Other (See Comments)    ? Possible tongue swelling    Other Palpitations    ALL MUSCLE RELAXERS-PER PATIENT  Robaxin [Methocarbamol] Other (See Comments)    Tongue swelling   Contrast Media [Iodinated Contrast Media] Itching   Iodine Itching   Gabapentin Other (See Comments)   Lyrica [Pregabalin]     Anxiety attacks    Patient Active Problem List   Diagnosis Date Noted   Pain of right sacroiliac joint 10/07/2021   Chronic low back pain with left-sided sciatica 03/31/2020   Lumbar disc disease 03/02/2020   Left sided sciatica 01/31/2020   Depression 01/29/2020   Hematuria 01/29/2020   Left-sided weakness 10/31/2019   Balance disorder 10/31/2019   Difficulty with speech 10/02/2019   Insomnia 10/02/2019   HLD (hyperlipidemia) 04/14/2019   Acute ear pain, bilateral 04/14/2019   Vitamin D deficiency  04/14/2019   Anxiety with depression 10/06/2018   Neck pain 09/10/2018   Hand swelling 09/10/2018   Bilateral radiating leg pain 04/23/2018   Myalgia 04/23/2018   Right renal stone 04/03/2018   Smoker 04/29/2016   Bilateral hand pain 04/29/2016   HNP (herniated nucleus pulposus), cervical 04/07/2016   Cervical radiculopathy, acute 01/29/2016   Acute upper respiratory infection 01/29/2016   Degenerative disc disease, cervical 01/11/2016   Hyperglycemia 11/17/2015   Angioedema 10/12/2015   Bilateral lower extremity edema 10/12/2015   Mouth sores 10/12/2015   Hypokalemia 10/12/2015   Thoracic scoliosis 10/01/2015   Peripheral edema 10/01/2015   Eczema 10/01/2015   Asthma 10/01/2015   Breast lump on right side at 4 o'clock position 01/21/2014   Hot flashes 10/01/2013   Lumbar radiculopathy 07/17/2012   Grief reaction 07/17/2012   Family history of colon cancer 07/02/2012   OAB (overactive bladder) 03/01/2012   Encounter for well adult exam with abnormal findings 08/23/2010   Plantar fasciitis 08/23/2010   Dysuria 01/05/2010   UTI (urinary tract infection) 10/19/2008   ANKLE PAIN, BILATERAL 10/19/2008   VAGINITIS 09/10/2008   JOINT EFFUSION, ANKLE 09/10/2008   SUBACROMIAL BURSITIS, RIGHT 09/01/2008   ACHILLES TENDINITIS 09/01/2008   LATERAL EPICONDYLITIS, RIGHT 08/18/2008   Essential hypertension 07/12/2007   Allergic rhinitis 07/12/2007   GERD 07/12/2007   Chronic low back pain 07/12/2007   NEPHROLITHIASIS, HX OF 07/12/2007    Past Medical History:  Diagnosis Date   ACHILLES TENDINITIS    ALLERGIC RHINITIS    Allergy    Anginal pain ([place]) 04/04/2016   Pt currently feels like she is having muscle spasms and aching in her chest   Anxiety    Asthma    Chronic kidney disease    Depression    GERD    History of kidney stones    HYPERTENSION    LATERAL EPICONDYLITIS, RIGHT    LOW BACK PAIN    Plantar fascial fibromatosis    Stress    SUBACROMIAL BURSITIS, RIGHT     Thoracic scoliosis 10/01/2015   TIA (transient ischemic attack)     Past Surgical History:  Procedure Laterality Date   ANTERIOR CERVICAL DECOMP/DISCECTOMY FUSION N/A 04/07/2016   Procedure: Cervical two-three Anterior cervical decompression/discectomy/fusion;  Surgeon: Ashok Pall, MD;  Location: [facility];  Service: Neurosurgery;  Laterality: N/A;   BACK SURGERY  2015   lower back   EXTRACORPOREAL SHOCK WAVE LITHOTRIPSY Left 09/28/2016   Procedure: LEFT EXTRACORPOREAL SHOCK WAVE LITHOTRIPSY (ESWL);  Surgeon: Festus Aloe, MD;  Location: [facility];  Service: Urology;  Laterality: Left;   EXTRACORPOREAL SHOCK WAVE LITHOTRIPSY Right 04/08/2018   Procedure: EXTRACORPOREAL SHOCK WAVE LITHOTRIPSY (ESWL);  Surgeon: Ardis Hughs, MD;  Location: [facility];  Service: Urology;  Laterality: Right;   KIDNEY SURGERY     LITHOTRIPSY  05/2016   TUBAL LIGATION      Social History   Socioeconomic History   Marital status: Legally Separated    Spouse name: Les   Number of children: 3   Years of education: Not on file   Highest education level: Not on file  Occupational History   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  Tobacco Use   Smoking status: Every Day    Packs/day: 0.50    Years: 20.00    Total pack years: 10.00    Types: Cigarettes   Smokeless tobacco: Never  Vaping Use   Vaping Use: Never used  Substance and Sexual Activity   Alcohol use: No   Drug use: No   Sexual activity: Not Currently    Birth control/protection: Post-menopausal, Surgical    Comment: 1st intercourse 61 yo-Fewer than 5 partners-BTL  Other Topics Concern   Not on file  Social History Narrative   Not on file   Social Determinants of Health   Financial Resource Strain: Not on file  Food Insecurity: Not on file  Transportation Needs: Not on file  Physical Activity: Not on file  Stress: Not on file  Social Connections: Not on file  Intimate Partner Violence: Not on file    Family History  Problem  Relation Age of Onset   Colon cancer Sister        dx in her 64s   Diabetes Mother    Colon cancer Father        dx in his 21s   Prostate cancer Father    Breast cancer Maternal Aunt        50's   Esophageal cancer Neg Hx    Stomach cancer Neg Hx      Review of Systems  Constitutional: Negative.  Negative for chills and fever.  HENT:  Positive for congestion and ear pain. Negative for ear discharge and sore throat.   Respiratory: Negative.  Negative for cough.   Cardiovascular: Negative.  Negative for chest pain and palpitations.  Gastrointestinal:  Negative for abdominal pain, diarrhea, nausea and vomiting.  Skin: Negative.  Negative for rash.  Neurological:  Negative for dizziness and headaches.  All other systems reviewed and are negative.  Today's Vitals   10/17/21 1002  BP: 136/88  Pulse: 73  Temp: 98 F (36.7 C)  TempSrc: Oral  SpO2: 96%  Weight: 152 lb 4 oz (69.1 kg)  Height: '5\' 2"'$  (1.575 m)   Body mass index is 27.85 kg/m.   Physical Exam Vitals reviewed.  Constitutional:      Appearance: Normal appearance.  HENT:     Head: Normocephalic.     Right Ear: Ear canal and external ear normal. Tympanic membrane is erythematous.     Left Ear: Ear canal and external ear normal. Tympanic membrane is erythematous.  Cardiovascular:     Rate and Rhythm: Normal rate.  Pulmonary:     Effort: Pulmonary effort is normal.     Breath sounds: Normal breath sounds.  Musculoskeletal:        General: Normal range of motion.  Skin:    General: Skin is warm and dry.  Neurological:     General: No focal deficit present.     Mental Status: She is alert and oriented to person, place, and time.      ASSESSMENT & PLAN: A total of 32 minutes was spent with the patient and counseling/coordination  of care regarding preparing for this visit, review of available medical records, review of all medications and allergies, diagnosis of bilateral otitis media and treatment, need for  antibiotic use, prognosis, documentation and need for follow-up if no better or worse during the next several days.  Problem List Items Addressed This Visit       Nervous and Auditory   Acute otalgia, bilateral - Primary    Bilateral infection with congestion. Start Augmentin 875 mg twice a day for 7 days. May take Tylenol for pain as needed. VoSol eardrops twice a day as needed for pain.      Relevant Medications   acetic acid-hydrocortisone (VOSOL-HC) OTIC solution   Bacterial ear infection, bilateral   Relevant Medications   amoxicillin-clavulanate (AUGMENTIN) 875-125 MG tablet   Patient Instructions  Otitis Media, Adult  Otitis media is a condition in which the middle ear is red and swollen (inflamed) and full of fluid. The middle ear is the part of the ear that contains bones for hearing as well as air that helps send sounds to the brain. The condition usually goes away on its own. What are the causes? This condition is caused by a blockage in the eustachian tube. This tube connects the middle ear to the back of the nose. It normally allows air into the middle ear. The blockage is caused by fluid or swelling. Problems that can cause blockage include: A cold or infection that affects the nose, mouth, or throat. Allergies. An irritant, such as tobacco smoke. Adenoids that have become large. The adenoids are soft tissue located in the back of the throat, behind the nose and the roof of the mouth. Growth or swelling in the upper part of the throat, just behind the nose (nasopharynx). Damage to the ear caused by a change in pressure. This is called barotrauma. What increases the risk? You are more likely to develop this condition if you: Smoke or are exposed to tobacco smoke. Have an opening in the roof of your mouth (cleft palate). Have acid reflux. Have problems in your body's defense system (immune system). What are the signs or symptoms? Symptoms of this condition  include: Ear pain. Fever. Problems with hearing. Being tired. Fluid leaking from the ear. Ringing in the ear. How is this treated? This condition can go away on its own within 3-5 days. But if the condition is caused by germs (bacteria) and does not go away on its own, or if it keeps coming back, your doctor may: Give you antibiotic medicines. Give you medicines for pain. Follow these instructions at home: Take over-the-counter and prescription medicines only as told by your doctor. If you were prescribed an antibiotic medicine, take it as told by your doctor. Do not stop taking it even if you start to feel better. Keep all follow-up visits. Contact a doctor if: You have bleeding from your nose. There is a lump on your neck. You are not feeling better in 5 days. You feel worse instead of better. Get help right away if: You have pain that is not helped with medicine. You have swelling, redness, or pain around your ear. You get a stiff neck. You cannot move part of your face (paralysis). You notice that the bone behind your ear hurts when you touch it. You get a very bad headache. Summary Otitis media means that the middle ear is red, swollen, and full of fluid. This condition usually goes away on its own. If the problem does not go  away, treatment may be needed. You may be given medicines to treat the infection or to treat your pain. If you were prescribed an antibiotic medicine, take it as told by your doctor. Do not stop taking it even if you start to feel better. Keep all follow-up visits. This information is not intended to replace advice given to you by your health care provider. Make sure you discuss any questions you have with your health care provider. Document Revised: 05/24/2020 Document Reviewed: 05/24/2020 Elsevier Patient Education  [place], MD [facility]

## 2021-10-17 NOTE — Telephone Encounter (Signed)
Called patient to inform her that a new prescription was sent to her pharmacy

## 2021-10-17 NOTE — Telephone Encounter (Signed)
Ok trazodone was increased to 150 mg qhs prn - done erx

## 2021-10-17 NOTE — Telephone Encounter (Signed)
Patient was here in office. Patient states her psychologist recommended she up the dose of her sleeping medication. Please advise

## 2021-10-17 NOTE — Assessment & Plan Note (Addendum)
Bilateral infection with congestion. Start Augmentin 875 mg twice a day for 7 days. May take Tylenol for pain as needed. VoSol eardrops twice a day as needed for pain.

## 2021-10-25 ENCOUNTER — Ambulatory Visit: Payer: BC Managed Care – PPO | Admitting: Internal Medicine

## 2021-10-25 VITALS — BP 128/76 | HR 71 | Temp 98.1°F | Ht 62.0 in | Wt 154.0 lb

## 2021-10-25 DIAGNOSIS — H6692 Otitis media, unspecified, left ear: Secondary | ICD-10-CM

## 2021-10-25 DIAGNOSIS — I10 Essential (primary) hypertension: Secondary | ICD-10-CM | POA: Diagnosis not present

## 2021-10-25 DIAGNOSIS — F17200 Nicotine dependence, unspecified, uncomplicated: Secondary | ICD-10-CM | POA: Diagnosis not present

## 2021-10-25 DIAGNOSIS — J309 Allergic rhinitis, unspecified: Secondary | ICD-10-CM | POA: Diagnosis not present

## 2021-10-25 DIAGNOSIS — H9203 Otalgia, bilateral: Secondary | ICD-10-CM

## 2021-10-25 MED ORDER — AZITHROMYCIN 250 MG PO TABS: ORAL_TABLET | ORAL | 1 refills | Status: AC

## 2021-10-25 MED ORDER — BENZONATATE 100 MG PO CAPS: ORAL_CAPSULE | ORAL | 2 refills | Status: DC

## 2021-10-25 NOTE — Patient Instructions (Addendum)
Please take all new medication as prescribed  - the antibiotic for the left ear  Please continue all other medications as before, and add OTC Nasacort for the allergies to help the left ear open up as well, and the tessalon for cough  Please have the pharmacy call with any other refills you may need.  Please continue your efforts at being more active, low cholesterol diet, and weight control.  Please keep your appointments with your specialists as you may have planned

## 2021-10-25 NOTE — Progress Notes (Unsigned)
Patient ID: [name] Walker, female   DOB: [birth year], 61 y.o.   MRN: [id number]        Chief Complaint: follow up ear infection       HPI:  [name] Walker is a 61 y.o. female here with c/o persistent left ear pain after augmentin course where right ear pain and associated symptoms are o/w resolved.  Pt denies chest pain, increased sob or doe, wheezing, orthopnea, PND, increased LE swelling, palpitations, dizziness or syncope.   Pt denies polydipsia, polyuria, or new focal neuro s/s.   Does have several wks ongoing nasal allergy symptoms with clearish congestion, itch and sneezing, without fever, pain, ST, cough, swelling or wheezing.  Pt stil smoking not ready to quit       Wt Readings from Last 3 Encounters:  10/25/21 154 lb (69.9 kg)  10/17/21 152 lb 4 oz (69.1 kg)  10/07/21 153 lb 3.2 oz (69.5 kg)   BP Readings from Last 3 Encounters:  10/25/21 128/76  10/17/21 136/88  10/07/21 (!) 149/85         Past Medical History:  Diagnosis Date   ACHILLES TENDINITIS    ALLERGIC RHINITIS    Allergy    Anginal pain ([place]) 04/04/2016   Pt currently feels like she is having muscle spasms and aching in her chest   Anxiety    Asthma    Chronic kidney disease    Depression    GERD    History of kidney stones    HYPERTENSION    LATERAL EPICONDYLITIS, RIGHT    LOW BACK PAIN    Plantar fascial fibromatosis    Stress    SUBACROMIAL BURSITIS, RIGHT    Thoracic scoliosis 10/01/2015   TIA (transient ischemic attack)    Past Surgical History:  Procedure Laterality Date   ANTERIOR CERVICAL DECOMP/DISCECTOMY FUSION N/A 04/07/2016   Procedure: Cervical two-three Anterior cervical decompression/discectomy/fusion;  Surgeon: Ashok Pall, MD;  Location: [facility];  Service: Neurosurgery;  Laterality: N/A;   BACK SURGERY  2015   lower back   EXTRACORPOREAL SHOCK WAVE LITHOTRIPSY Left 09/28/2016   Procedure: LEFT EXTRACORPOREAL SHOCK WAVE LITHOTRIPSY (ESWL);  Surgeon: Festus Aloe, MD;  [facility];  Service: Urology;  Laterality: Left;   EXTRACORPOREAL SHOCK WAVE LITHOTRIPSY Right 04/08/2018   Procedure: EXTRACORPOREAL SHOCK WAVE LITHOTRIPSY (ESWL);  Surgeon: Ardis Hughs, MD;  [facility];  Service: Urology;  Laterality: Right;   KIDNEY SURGERY     LITHOTRIPSY  05/2016   TUBAL LIGATION      reports that she has been smoking cigarettes. She has a 10.00 pack-year smoking history. She has never used smokeless tobacco. She reports that she does not drink alcohol and does not use drugs. family history includes Breast cancer in her maternal aunt; Colon cancer in her father and sister; Diabetes in her mother; Prostate cancer in her father. Allergies  Allergen Reactions   Flexeril [Cyclobenzaprine] Swelling    Tongue swells   Lisinopril Other (See Comments)    ? Possible tongue swelling    Other Palpitations    ALL MUSCLE RELAXERS-PER PATIENT   Robaxin [Methocarbamol] Other (See Comments)    Tongue swelling   Contrast Media [Iodinated Contrast Media] Itching   Iodine Itching   Gabapentin Other (See Comments)   Lyrica [Pregabalin]     Anxiety attacks   Current Outpatient Medications on File Prior to Visit  Medication Sig Dispense Refill   acetaminophen-codeine (TYLENOL #3) 300-30 MG tablet Take 1 tablet  by mouth every 12 (twelve) hours as needed for moderate pain. 60 tablet 2   acetic acid-hydrocortisone (VOSOL-HC) OTIC solution Place 3 drops into the left ear 3 (three) times daily. 10 mL 1   albuterol (VENTOLIN HFA) 108 (90 Base) MCG/ACT inhaler INHALE 2 PUFFS BY MOUTH EVERY 6 HOURS AS NEEDED FOR WHEEZING FOR SHORTNESS OF BREATH 18 g 5   alendronate (FOSAMAX) 70 MG tablet Take 1 tablet (70 mg total) by mouth once a week. 12 tablet 3   amLODipine (NORVASC) 5 MG tablet Take 1 tablet (5 mg total) by mouth daily. 90 tablet 3   aspirin EC 81 MG tablet Take 1 tablet (81 mg total) by mouth daily. Swallow whole. 30 tablet 11   atorvastatin (LIPITOR) 10 MG tablet Take 1 tablet  (10 mg total) by mouth daily. 90 tablet 3   celecoxib (CELEBREX) 200 MG capsule Take 1 capsule (200 mg total) by mouth 2 (two) times daily. 60 capsule 11   cetirizine (ZYRTEC) 10 MG tablet Take 10 mg by mouth daily as needed for allergies.     Cholecalciferol (THERA-D 2000) 50 MCG (2000 UT) TABS 1 tab by mouth once daily 30 tablet 99   citalopram (CELEXA) 40 MG tablet Take 1 tablet (40 mg total) by mouth daily. 90 tablet 3   glycopyrrolate (ROBINUL) 2 MG tablet Take 1 tablet (2 mg total) by mouth 2 (two) times daily. 60 tablet 11   LORazepam (ATIVAN) 0.5 MG tablet Take 1 tablet (0.5 mg total) by mouth 2 (two) times daily as needed. for anxiety 60 tablet 2   losartan (COZAAR) 50 MG tablet Take 1 tablet (50 mg total) by mouth daily. 90 tablet 3   naloxone (NARCAN) nasal spray 4 mg/0.1 mL      ondansetron (ZOFRAN) 8 MG tablet Take 1 tablet (8 mg total) by mouth every 8 (eight) hours as needed for nausea or vomiting. 20 tablet 0   pantoprazole (PROTONIX) 40 MG tablet Take 1 tablet (40 mg total) by mouth 2 (two) times daily before a meal. 180 tablet 3   traZODone (DESYREL) 100 MG tablet Take 1.5 tablets (150 mg total) by mouth at bedtime as needed for sleep. 135 tablet 1   No current facility-administered medications on file prior to visit.        ROS:  All others reviewed and negative.  Objective        PE:  BP 128/76   Pulse 71   Temp 98.1 F (36.7 C) (Oral)   Ht '5\' 2"'$  (1.575 m)   Wt 154 lb (69.9 kg)   LMP 08/22/2010   SpO2 96%   BMI 28.17 kg/m                 Constitutional: Pt appears in NAD               HENT: Head: NCAT.                Right Ear: External ear normal.                 Left Ear: External ear normal.  Left TM with moderate erythema and bulging               Eyes: . Pupils are equal, round, and reactive to light. Conjunctivae and EOM are normal               Nose: without d/c or deformity  Neck: Neck supple. Gross normal ROM               Cardiovascular:  Normal rate and regular rhythm.                 Pulmonary/Chest: Effort normal and breath sounds without rales or wheezing.                Abd:  Soft, NT, ND, + BS, no organomegaly               Neurological: Pt is alert. At baseline orientation, motor grossly intact               Skin: Skin is warm. No rashes, no other new lesions, LE edema - none               Psychiatric: Pt behavior is normal without agitation   Micro: none  Cardiac tracings I have personally interpreted today:  none  Pertinent Radiological findings (summarize): none   Lab Results  Component Value Date   WBC 5.1 08/18/2021   HGB 14.6 08/18/2021   HCT 44.9 08/18/2021   PLT 125.0 (L) 08/18/2021   GLUCOSE 101 (H) 08/18/2021   CHOL 154 08/18/2021   TRIG 250.0 (H) 08/18/2021   HDL 36.10 (L) 08/18/2021   LDLDIRECT 82.0 08/18/2021   LDLCALC 46 06/30/2020   ALT 21 08/18/2021   AST 20 08/18/2021   NA 142 08/18/2021   K 3.3 (L) 08/18/2021   CL 108 08/18/2021   CREATININE 0.67 08/18/2021   BUN 12 08/18/2021   CO2 29 08/18/2021   TSH 0.63 08/18/2021   INR 0.9 09/30/2019   HGBA1C 5.3 08/18/2021   Assessment/Plan:  [name] Walker is a 61 y.o. Black or African American [2] female with  has a past medical history of ACHILLES TENDINITIS, ALLERGIC RHINITIS, Allergy, Anginal pain ([place]) (04/04/2016), Anxiety, Asthma, Chronic kidney disease, Depression, GERD, History of kidney stones, HYPERTENSION, LATERAL EPICONDYLITIS, RIGHT, LOW BACK PAIN, Plantar fascial fibromatosis, Stress, SUBACROMIAL BURSITIS, RIGHT, Thoracic scoliosis (10/01/2015), and TIA (transient ischemic attack).  Left otitis media Mild to mod, for antibx course - zpack,  to f/u any worsening symptoms or concerns  Allergic rhinitis With seasonal flare - for zyrtec and nasacort, mucinex bid prn  Essential hypertension BP Readings from Last 3 Encounters:  10/25/21 128/76  10/17/21 136/88  10/07/21 (!) 149/85   Stable, pt to continue medical treatment  norvasc 5 mg qd, losartan 50 mg qd   Smoker Pt counsled to quit, pt not ready  Followup: Return if symptoms worsen or fail to improve.  Cathlean Cower, MD 10/26/2021 3:05 PM [facility]

## 2021-10-26 ENCOUNTER — Encounter: Payer: Self-pay | Admitting: Internal Medicine

## 2021-10-26 DIAGNOSIS — H6692 Otitis media, unspecified, left ear: Secondary | ICD-10-CM | POA: Insufficient documentation

## 2021-10-26 NOTE — Assessment & Plan Note (Signed)
Pt counsled to quit, pt not ready °

## 2021-10-26 NOTE — Assessment & Plan Note (Signed)
With seasonal flare - for zyrtec and nasacort, mucinex bid prn

## 2021-10-26 NOTE — Assessment & Plan Note (Signed)
BP Readings from Last 3 Encounters:  10/25/21 128/76  10/17/21 136/88  10/07/21 (!) 149/85   Stable, pt to continue medical treatment norvasc 5 mg qd, losartan 50 mg qd

## 2021-10-26 NOTE — Assessment & Plan Note (Signed)
Mild to mod, for antibx course zpack,  to f/u any worsening symptoms or concerns 

## 2021-11-01 ENCOUNTER — Telehealth: Payer: Self-pay

## 2021-11-01 DIAGNOSIS — H9192 Unspecified hearing loss, left ear: Secondary | ICD-10-CM

## 2021-11-01 NOTE — Telephone Encounter (Signed)
We should refer to ENT - hopefully she will hear soon

## 2021-11-01 NOTE — Telephone Encounter (Signed)
See below , thanks

## 2021-11-01 NOTE — Telephone Encounter (Signed)
I advised the pt that ENT referral was placed at this time no additional Abx was prescribed.

## 2021-11-01 NOTE — Telephone Encounter (Signed)
Pt is calling to report she finished the abx but still having difficulty hearing out of the Left  ear.  Pt is asking for more Abx or what else can she do.  Please advise

## 2021-11-18 ENCOUNTER — Encounter: Payer: Self-pay | Admitting: Physical Medicine & Rehabilitation

## 2021-11-18 ENCOUNTER — Encounter
Payer: BC Managed Care – PPO | Attending: Physical Medicine and Rehabilitation | Admitting: Physical Medicine & Rehabilitation

## 2021-11-18 VITALS — BP 132/86 | HR 78 | Ht 62.0 in | Wt 157.2 lb

## 2021-11-18 DIAGNOSIS — Z5181 Encounter for therapeutic drug level monitoring: Secondary | ICD-10-CM | POA: Diagnosis present

## 2021-11-18 DIAGNOSIS — Z79891 Long term (current) use of opiate analgesic: Secondary | ICD-10-CM | POA: Diagnosis present

## 2021-11-18 DIAGNOSIS — G894 Chronic pain syndrome: Secondary | ICD-10-CM

## 2021-11-18 MED ORDER — ACETAMINOPHEN-CODEINE 300-30 MG PO TABS: 1.0000 | ORAL_TABLET | Freq: Two times a day (BID) | ORAL | 2 refills | Status: DC | PRN

## 2021-11-18 NOTE — Patient Instructions (Signed)

## 2021-11-18 NOTE — Progress Notes (Signed)
Subjective:    Patient ID: [name] Walker, female    DOB: Apr 22, 1960, 61 y.o.   MRN: [id number] 61 year old female with chronic low back and buttock pain who returns today with complaints of low back and buttock pain.  We discussed her pain location we discussed the results of her recent injections of the sacroiliac joint and sacroiliac nerve blocks.  She had good temporary relief of greater than 50% in each location.  We discussed neck steps including radiofrequency neurotomy on the left side as well as sacroiliac nerve blocks on the right side.  Her pain is only been partially responsive to medication management including narcotic analgesics and interferes with activity level 10/07/2021 Right sacroiliac IA injection      Pain Level 10/10 4/10  08/11/21 L5 dorsal ramus S1-S2-S3 lateral branch blocks under fluoroscopic guidance Left side  Right sacroiliac injection under fluoroscopic guidance   Pain Level 9/10 0/10      HPI Pain Inventory Average Pain 7 Pain Right Now 3 My pain is sharp, burning, tingling, and aching  In the last 24 hours, has pain interfered with the following? General activity 3 Relation with others 10 Enjoyment of life 10 What TIME of day is your pain at its worst? morning , daytime, evening, and night Sleep (in general) Good  Pain is worse with: walking, sitting, inactivity, and standing Pain improves with: heat/ice Relief from Meds: 7  Family History  Problem Relation Age of Onset   Colon cancer Sister        dx in her 75s   Diabetes Mother    Colon cancer Father        dx in his 61s   Prostate cancer Father    Breast cancer Maternal Aunt        50's   Esophageal cancer Neg Hx    Stomach cancer Neg Hx    Social History   Socioeconomic History   Marital status: Legally Separated    Spouse name: Les   Number of children: 3   Years of education: Not on file   Highest education level: Not on file  Occupational History   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  Tobacco Use   Smoking status: Every Day    Packs/day: 0.50    Years: 20.00    Total pack years: 10.00    Types: Cigarettes   Smokeless tobacco: Never  Vaping Use   Vaping Use: Never used  Substance and Sexual Activity   Alcohol use: No   Drug use: No   Sexual activity: Not Currently    Birth control/protection: Post-menopausal, Surgical    Comment: 1st intercourse 61 yo-Fewer than 5 partners-BTL  Other Topics Concern   Not on file  Social History Narrative   Not on file   Social Determinants of Health   Financial Resource Strain: Not on file  Food Insecurity: Not on file  Transportation Needs: Not on file  Physical Activity: Not on file  Stress: Not on file  Social Connections: Not on file   Past Surgical History:  Procedure Laterality Date   ANTERIOR CERVICAL DECOMP/DISCECTOMY FUSION N/A 04/07/2016   Procedure: Cervical two-three Anterior cervical decompression/discectomy/fusion;  Surgeon: Ashok Pall, MD;  Location: [facility];  Service: Neurosurgery;  Laterality: N/A;   BACK SURGERY  2015   lower back   EXTRACORPOREAL SHOCK WAVE LITHOTRIPSY Left 09/28/2016   Procedure: LEFT EXTRACORPOREAL SHOCK WAVE LITHOTRIPSY (ESWL);  Surgeon: Festus Aloe, MD;  Location[facility];  Service: Urology;  Laterality: Left;   EXTRACORPOREAL SHOCK WAVE LITHOTRIPSY Right 04/08/2018   Procedure: EXTRACORPOREAL SHOCK WAVE LITHOTRIPSY (ESWL);  Surgeon: Ardis Hughs, MD;  [facility];  Service: Urology;  Laterality: Right;   KIDNEY SURGERY     LITHOTRIPSY  05/2016   TUBAL LIGATION     Past Surgical History:  Procedure Laterality Date   ANTERIOR CERVICAL DECOMP/DISCECTOMY FUSION N/A 04/07/2016   Procedure: Cervical two-three Anterior cervical decompression/discectomy/fusion;  Surgeon: Ashok Pall, MD;  Location: [facility];  Service: Neurosurgery;  Laterality: N/A;   BACK SURGERY  2015   lower back   EXTRACORPOREAL SHOCK WAVE LITHOTRIPSY Left 09/28/2016    Procedure: LEFT EXTRACORPOREAL SHOCK WAVE LITHOTRIPSY (ESWL);  Surgeon: Festus Aloe, MD;  [facility];  Service: Urology;  Laterality: Left;   EXTRACORPOREAL SHOCK WAVE LITHOTRIPSY Right 04/08/2018   Procedure: EXTRACORPOREAL SHOCK WAVE LITHOTRIPSY (ESWL);  Surgeon: Ardis Hughs, MD;  [facility];  Service: Urology;  Laterality: Right;   KIDNEY SURGERY     LITHOTRIPSY  05/2016   TUBAL LIGATION     Past Medical History:  Diagnosis Date   ACHILLES TENDINITIS    ALLERGIC RHINITIS    Allergy    Anginal pain ([place]) 04/04/2016   Pt currently feels like she is having muscle spasms and aching in her chest   Anxiety    Asthma    Chronic kidney disease    Depression    GERD    History of kidney stones    HYPERTENSION    LATERAL EPICONDYLITIS, RIGHT    LOW BACK PAIN    Plantar fascial fibromatosis    Stress    SUBACROMIAL BURSITIS, RIGHT    Thoracic scoliosis 10/01/2015   TIA (transient ischemic attack)    BP 132/86   Pulse 78   Ht '5\' 2"'$  (1.575 m)   Wt 157 lb 3.2 oz (71.3 kg)   LMP 08/22/2010   SpO2 98%   BMI 28.75 kg/m   Opioid Risk Score:   Fall Risk Score:  `1  Depression screen [facility] 2/9     10/25/2021    3:17 PM 10/17/2021   10:07 AM 10/07/2021   10:30 AM 08/18/2021    1:27 PM 08/18/2021    1:08 PM 08/11/2021   12:14 PM 06/24/2021   12:54 PM  Depression screen PHQ 2/9  Decreased Interest 0 2 0 0 0 1 0  Down, Depressed, Hopeless 0 2 0 0 0 1 0  PHQ - 2 Score 0 4 0 0 0 2 0  Altered sleeping  1   1    Tired, decreased energy  0   0    Change in appetite  1   0    Feeling bad or failure about yourself   0   0    Trouble concentrating  0   1    Moving slowly or fidgety/restless  2   1    Suicidal thoughts  0   0    PHQ-9 Score  8   3        Review of Systems  Musculoskeletal:  Positive for back pain.  All other systems reviewed and are negative.     Objective:   Physical Exam   Sacral thrust (prone) : Positive bilateral Lateral compression:  Negative FABER's: Positive right SI Distraction (supine): Positive bilateral SI Thigh thrust test: Positive bilateral SI  Mild tenderness palpation lumbar paraspinals moderate tenderness over upper sacral area Pain is relieved  with extension of the lumbar spine and worsened with flexion. Negative straight leg raising bilaterally Motor strength is 5/5 bilateral hip flexor knee extensor ankle dorsiflexors.       Assessment & Plan:   1.  Chronic low back and sacroiliac pain.  At this point she is requiring narcotic analgesics Tylenol 3 twice daily.  She has been taking them for more than 6 weeks.  We will get signed controlled substance agreement as well as opioid consent and do urine drug screen today. From an interventional standpoint we will order sacroiliac RF on the left side.  Overall goal is to decrease pain and increase function and reduce reliance on narcotic analgesic medications.

## 2021-11-24 ENCOUNTER — Telehealth: Payer: Self-pay | Admitting: *Deleted

## 2021-11-24 LAB — TOXASSURE SELECT,+ANTIDEPR,UR

## 2021-11-24 NOTE — Telephone Encounter (Signed)
Urine drug screen for this encounter is consistent for prescribed medication. There is a trace amount of oxazepam which is a metabolite of vialium. She currently is taking lorazepm which does not metabolize to oxazepam, but historically there has been an rx filled for valium in the past and she was prescribed one for the previous visit procedure.

## 2021-12-05 DIAGNOSIS — H6502 Acute serous otitis media, left ear: Secondary | ICD-10-CM | POA: Insufficient documentation

## 2021-12-19 ENCOUNTER — Other Ambulatory Visit: Payer: Self-pay | Admitting: Internal Medicine

## 2021-12-19 ENCOUNTER — Telehealth: Payer: Self-pay | Admitting: Internal Medicine

## 2021-12-19 NOTE — Telephone Encounter (Signed)
Pt called to confirm she requested lorazepam  refill at [place], [place]

## 2021-12-20 ENCOUNTER — Other Ambulatory Visit: Payer: Self-pay | Admitting: Internal Medicine

## 2021-12-20 DIAGNOSIS — Z1231 Encounter for screening mammogram for malignant neoplasm of breast: Secondary | ICD-10-CM

## 2021-12-23 ENCOUNTER — Telehealth: Payer: Self-pay | Admitting: Internal Medicine

## 2021-12-24 ENCOUNTER — Ambulatory Visit: Admission: RE | Admit: 2021-12-24 | Payer: BC Managed Care – PPO | Source: Ambulatory Visit

## 2021-12-26 ENCOUNTER — Telehealth: Payer: Self-pay | Admitting: Internal Medicine

## 2021-12-26 NOTE — Telephone Encounter (Signed)
Patient called to report LORAZEPAM 0.5 MG is on back order at her pharmacy. Pt reports only given 6 pills on 12/19/21 and still do not have that dosage in stock.  Call Back [phone]  Date of Last Office Visit: 10/25/21  Date of Next Office Visit:  -Not Scheduled  Medication(s) Requested: LORAZEPAM 1 MG is in stock at [place]  Preferred Pharmacy:  [facility] [address]  ** Please notify patient to allow 48-72 hours to process** **Let patient know to contact pharmacy at the end of the day to make sure medication is ready. ** **If patient has not been seen in a year or longer, book an appointment **Advise to use MyChart for refill requests OR to contact their pharmacy

## 2021-12-29 ENCOUNTER — Other Ambulatory Visit: Payer: Self-pay

## 2021-12-29 NOTE — Telephone Encounter (Signed)
Pt called checking status of LORAZEPAM request. Pt called her pharmacy and they still do not have her dosage in stock. Pt they told her the dosage could be increased to 1 mg and cut pills in half if provider will send a new RX for 1 mg.  Reader

## 2021-12-29 NOTE — Progress Notes (Signed)
  PROCEDURE RECORD [facility]   Name: [name] Walker DOB:10-Jul-1960 MRN: [id number]  Date:12/29/2021  Physician: Alysia Penna, MD    Nurse/CMA: Jorja Loa MA  Allergies:  Allergies  Allergen Reactions   Flexeril [Cyclobenzaprine] Swelling    Tongue swells   Lisinopril Other (See Comments)    ? Possible tongue swelling  Other reaction(s): Other (See Comments) ? Possible tongue swelling   ? Possible tongue swelling   Other Palpitations    ALL MUSCLE RELAXERS-PER PATIENT   Robaxin [Methocarbamol] Other (See Comments)    Tongue swelling   Contrast Media [Iodinated Contrast Media] Itching   Iodine Itching   Gabapentin Other (See Comments)   Pregabalin Anxiety    Anxiety attacks Anxiety attacks   Consent Signed: yes  Is patient diabetic? No.  CBG today? N/a  Pregnant: No. LMP: Patient's last menstrual period was 08/22/2010. (age 20-55)  Anticoagulants: no Anti-inflammatory: no Antibiotics: no  Procedure: Left Saroiliac Radiofrequency  Position: Prone Start Time: 1:17pm  End Time: 1:36pm  Fluoro Time: 31  RN/CMA Caralynn Gelber MA Yalena Colon MA    Time 12:45 1:40pm    BP 140/82 151/85    Pulse 76 68    Respirations 16 16    O2 Sat 97 97    S/S 6 6    Pain Level 9/10 0/10     D/C home with Son, patient A & O X 3, D/C instructions reviewed, and sits independently.

## 2021-12-30 ENCOUNTER — Encounter: Payer: Self-pay | Admitting: Physical Medicine & Rehabilitation

## 2021-12-30 ENCOUNTER — Encounter
Payer: BC Managed Care – PPO | Attending: Physical Medicine and Rehabilitation | Admitting: Physical Medicine & Rehabilitation

## 2021-12-30 VITALS — BP 140/82 | HR 76 | Ht 62.0 in | Wt 154.0 lb

## 2021-12-30 DIAGNOSIS — M259 Joint disorder, unspecified: Secondary | ICD-10-CM | POA: Insufficient documentation

## 2021-12-30 MED ORDER — LORAZEPAM 1 MG PO TABS: 0.5000 mg | ORAL_TABLET | Freq: Two times a day (BID) | ORAL | 2 refills | Status: DC | PRN

## 2021-12-30 MED ORDER — LIDOCAINE HCL 1 % IJ SOLN
5.0000 mL | Freq: Once | INTRAMUSCULAR | Status: AC
  Administered 2021-12-30: 5 mL

## 2021-12-30 MED ORDER — LIDOCAINE HCL (PF) 2 % IJ SOLN
2.0000 mL | Freq: Once | INTRAMUSCULAR | Status: AC
  Administered 2021-12-30: 2 mL

## 2021-12-30 NOTE — Progress Notes (Signed)
Left sacroiliac radio frequency Neurotomy  under fluoroscopic guidance This consists of  L5 dorsal ramus , S1,2,3 lateral branch radiofrequency Neurotomy    Indication is sacroiliac pain which has improved temporarily  by at least 50% after Intraarticular sacroiliac and/or L5 DR, S1.2.3 lateral branch blocks under fluoroscopic guidance Pain interferes with self-care and mobility and has failed to respond to conservative measures.  Informed consent was obtained after discussing risks and benefits of the procedure with the patient these include bleeding bruising and infection temporary or permanent paralysis. The patient elects to proceed and has given written consent.  Patient placed prone on fluoroscopy table. Area marked and prepped with Betadine. Fluoroscopic images utilized to guide needle. 25-gauge 1.5 inch needle was used to anesthetize 5 injection points with 2 cc of 1% lidocaine each. Then a 18-gauge 10 cm RF needle with a 10 mm curved active tip was inserted under fluoroscopic guidance first targeting the S1 SA P./sacral ala junction, bone contact made and confirmed with lateral imaging.  motor stimulation at 2 Hz confirm proper needle location followed by injection of 7m of 2% lidocaine MPF. imaging.  Motor stimulation at 2 Hz confirmed proper needle location followed by injection of one ML of the2% lidocaine solution. Radio frequency 80C for 90 seconds was performed. Then the infero- lateral aspect of the S1, S2 and lateral aspect of S3 sacral foramina were targeted. Bone contact made.  Motor stim at 2 Hz confirmed proper needle location. One ML of the 2% lidocaine solution was injected into each of 3 sites and radio frequency ablation 80C for 90 seconds was performed. Patient tolerated procedure well. Post procedure instructions given

## 2021-12-30 NOTE — Patient Instructions (Signed)
You had a radio frequency procedure today This was done to alleviate joint pain in your sacral area We injected lidocaine which is a local anesthetic.  You may experience soreness at the injection sites. You may also experienced some irritation of the nerves that were heated I'm recommending ice for 30 minutes every 2 hours as needed for the next 24-48 hours

## 2021-12-30 NOTE — Telephone Encounter (Signed)
Ok this is done 

## 2022-01-02 NOTE — Telephone Encounter (Signed)
Patient called back and said that they didn't have that either. She asked for a call back at [phone]

## 2022-01-03 ENCOUNTER — Telehealth: Payer: Self-pay | Admitting: Internal Medicine

## 2022-01-03 ENCOUNTER — Telehealth: Payer: Self-pay

## 2022-01-03 NOTE — Telephone Encounter (Signed)
Right hip pain down to right leg. Pain level is a 10/10 all day long. Using a heating pad with minimal relief.   Patient want to know what other option for pain relief she can use?  Or can you prescribe something stronger?   Call back phone [phone].

## 2022-01-03 NOTE — Telephone Encounter (Signed)
Patient's lorazapam was sent to [facility] on [address] - they were out of this medication - Please resend to [facility] on [address].  This pharmacy has the medication.

## 2022-01-04 MED ORDER — LORAZEPAM 1 MG PO TABS: 0.5000 mg | ORAL_TABLET | Freq: Two times a day (BID) | ORAL | 2 refills | Status: DC | PRN

## 2022-01-04 NOTE — Telephone Encounter (Signed)
Patient is still waiting on this rx

## 2022-01-04 NOTE — Telephone Encounter (Signed)
Patient states that pharmacy does not have Lorazepam '1mg'$  in stock either.

## 2022-01-04 NOTE — Telephone Encounter (Signed)
I dont have any other suggestions, sorry

## 2022-01-04 NOTE — Telephone Encounter (Signed)
Done erx 

## 2022-01-08 ENCOUNTER — Other Ambulatory Visit: Payer: Self-pay | Admitting: Physical Medicine & Rehabilitation

## 2022-01-08 MED ORDER — ACETAMINOPHEN-CODEINE 300-60 MG PO TABS: 1.0000 | ORAL_TABLET | Freq: Two times a day (BID) | ORAL | 1 refills | Status: DC | PRN

## 2022-02-07 ENCOUNTER — Other Ambulatory Visit: Payer: Self-pay | Admitting: Internal Medicine

## 2022-02-07 MED ORDER — TRAZODONE HCL 100 MG PO TABS: 150.0000 mg | ORAL_TABLET | Freq: Every evening | ORAL | 1 refills | Status: DC | PRN

## 2022-02-09 ENCOUNTER — Telehealth: Payer: Self-pay

## 2022-02-09 NOTE — Telephone Encounter (Signed)
approved

## 2022-02-14 ENCOUNTER — Telehealth: Payer: Self-pay

## 2022-02-14 ENCOUNTER — Encounter: Payer: Medicare PPO | Admitting: Physical Medicine & Rehabilitation

## 2022-02-14 NOTE — Telephone Encounter (Signed)
Good Morning, Auth for [name] Walker is still pending and they are offering a peer to peer review Pending: In MD review Tracking #[id number]  Note: Pending for higher level review by MD  They can be reached at [phone] for scheduling then for the options it will be option 1, then press option 2 5 times and then option 3.   Will call patient to inform.

## 2022-02-14 NOTE — Telephone Encounter (Signed)
Patient requesting refill on Tylenol last fill per pmp was 01/10/22  Filled  Written  ID  Drug  QTY  Days  Prescriber  RX #  Dispenser  Refill  Daily Dose*  Pymt Type  PMP  01/10/2022 01/08/2022 1  Acetaminophen-Cod #4 Tablet 60.00 30 An Kir [id number] [facility] ([id number]) 0/1 18.00 MME Comm Ins 

## 2022-02-24 ENCOUNTER — Telehealth: Payer: Self-pay | Admitting: Internal Medicine

## 2022-02-24 MED ORDER — SCOPOLAMINE 1 MG/3DAYS TD PT72: 1.0000 | MEDICATED_PATCH | TRANSDERMAL | 0 refills | Status: DC

## 2022-02-24 NOTE — Telephone Encounter (Signed)
Ok this is done to [facility]

## 2022-02-24 NOTE — Telephone Encounter (Signed)
Patient states that she is going on a cruise on the 10th of January.  She would like some of the seasick patches that go behind your ear to be called in.  Please call in to [facility] on [address].  Patients #  [phone]

## 2022-02-24 NOTE — Telephone Encounter (Signed)
Please advise for seasick patches

## 2022-03-01 ENCOUNTER — Telehealth: Payer: Self-pay

## 2022-03-01 NOTE — Telephone Encounter (Signed)
PA approved for Acetaminophen-cod #4 tablet good until 02/27/2023

## 2022-03-10 NOTE — Telephone Encounter (Signed)
Appeal was denied for Ms. Navarez, advising that they will maintain our original decision to not approve outpatient services for joint disorder, unspecified. There are additional options available, they have a contract w/ centers of Medicare & medicaid services and the case can be forwarded to [employer] and they will make a final decision and notify [employer] and they will abide by the decision. If you have additional information that can be helpful, it can be submitted in writing or faxed. Please let me know what you would like to do in this case and I have contacted patient also to let her know what is going on. They also have a phone number of [phone] if you would like to try that. Appeal file # is: [id number].

## 2022-03-30 ENCOUNTER — Encounter: Payer: Medicare HMO | Admitting: Physical Medicine & Rehabilitation

## 2022-04-04 ENCOUNTER — Other Ambulatory Visit: Payer: Self-pay | Admitting: Internal Medicine

## 2022-04-07 ENCOUNTER — Encounter: Payer: Self-pay | Admitting: Physical Medicine & Rehabilitation

## 2022-04-07 ENCOUNTER — Encounter: Payer: Medicare HMO | Attending: Physical Medicine and Rehabilitation | Admitting: Physical Medicine & Rehabilitation

## 2022-04-07 VITALS — BP 139/90 | HR 71 | Ht 62.0 in | Wt 153.0 lb

## 2022-04-07 DIAGNOSIS — M47817 Spondylosis without myelopathy or radiculopathy, lumbosacral region: Secondary | ICD-10-CM | POA: Insufficient documentation

## 2022-04-07 DIAGNOSIS — M533 Sacrococcygeal disorders, not elsewhere classified: Secondary | ICD-10-CM | POA: Diagnosis not present

## 2022-04-07 MED ORDER — ACETAMINOPHEN-CODEINE 300-60 MG PO TABS: 1.0000 | ORAL_TABLET | Freq: Two times a day (BID) | ORAL | 1 refills | Status: DC | PRN

## 2022-04-07 NOTE — Patient Instructions (Signed)
Referral made for aquatic therapy they will call you

## 2022-04-07 NOTE — Progress Notes (Signed)
Subjective:    Patient ID: [name] Walker, female    DOB: December 15, 1960, 62 y.o.   MRN: [id number]  HPI  62 year old female with prior left hemilaminotomy left L5-S1 chronic low back pain she has also buttocks pain.  Imaging studies were reviewed demonstrating multilevel lumbar facet arthropathy noted on MRI from 2020 The patient complains mainly of back pain that is midline just above the waist. She feels like her mobility has been declining over the last 3 months since patient was last seen in this office.  No falls. Right SI injection 10/07/21  Left SI RF 12/30/21   Pain Inventory Average Pain 10 Pain Right Now 10 My pain is constant, sharp, and aching  In the last 24 hours, has pain interfered with the following? General activity 2 Relation with others 6 Enjoyment of life 0 What TIME of day is your pain at its worst? morning , evening, and night Sleep (in general) Fair  Pain is worse with: walking, bending, sitting, and standing Pain improves with: rest, heat/ice, medication, and injections Relief from Meds: 8  Family History  Problem Relation Age of Onset   Colon cancer Sister        dx in her 45s   Diabetes Mother    Colon cancer Father        dx in his 37s   Prostate cancer Father    Breast cancer Maternal Aunt        50's   Esophageal cancer Neg Hx    Stomach cancer Neg Hx    Social History   Socioeconomic History   Marital status: Legally Separated    Spouse name: Les   Number of children: 3   Years of education: Not on file   Highest education level: Not on file  Occupational History   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  Tobacco Use   Smoking status: Every Day    Packs/day: 0.50    Years: 20.00    Total pack years: 10.00    Types: Cigarettes   Smokeless tobacco: Never  Vaping Use   Vaping Use: Never used  Substance and Sexual Activity   Alcohol use: No   Drug use: No   Sexual activity: Not Currently    Birth control/protection:  Post-menopausal, Surgical    Comment: 1st intercourse 62 yo-Fewer than 5 partners-BTL  Other Topics Concern   Not on file  Social History Narrative   Not on file   Social Determinants of Health   Financial Resource Strain: Not on file  Food Insecurity: Not on file  Transportation Needs: Not on file  Physical Activity: Not on file  Stress: Not on file  Social Connections: Not on file   Past Surgical History:  Procedure Laterality Date   ANTERIOR CERVICAL DECOMP/DISCECTOMY FUSION N/A 04/07/2016   Procedure: Cervical two-three Anterior cervical decompression/discectomy/fusion;  Surgeon: Ashok Pall, MD;  Location: [place];  Service: Neurosurgery;  Laterality: N/A;   BACK SURGERY  2015   lower back   EXTRACORPOREAL SHOCK WAVE LITHOTRIPSY Left 09/28/2016   Procedure: LEFT EXTRACORPOREAL SHOCK WAVE LITHOTRIPSY (ESWL);  Surgeon: Festus Aloe, MD;  Location: [facility];  Service: Urology;  Laterality: Left;   EXTRACORPOREAL SHOCK WAVE LITHOTRIPSY Right 04/08/2018   Procedure: EXTRACORPOREAL SHOCK WAVE LITHOTRIPSY (ESWL);  Surgeon: Ardis Hughs, MD;  Location: [facility];  Service: Urology;  Laterality: Right;   KIDNEY SURGERY     LITHOTRIPSY  05/2016   TUBAL LIGATION     Past  Surgical History:  Procedure Laterality Date   ANTERIOR CERVICAL DECOMP/DISCECTOMY FUSION N/A 04/07/2016   Procedure: Cervical two-three Anterior cervical decompression/discectomy/fusion;  Surgeon: Ashok Pall, MD;  Location: [place];  Service: Neurosurgery;  Laterality: N/A;   BACK SURGERY  2015   lower back   EXTRACORPOREAL SHOCK WAVE LITHOTRIPSY Left 09/28/2016   Procedure: LEFT EXTRACORPOREAL SHOCK WAVE LITHOTRIPSY (ESWL);  Surgeon: Festus Aloe, MD;  [facility];  Service: Urology;  Laterality: Left;   EXTRACORPOREAL SHOCK WAVE LITHOTRIPSY Right 04/08/2018   Procedure: EXTRACORPOREAL SHOCK WAVE LITHOTRIPSY (ESWL);  Surgeon: Ardis Hughs, MD;  [facility];  Service: Urology;  Laterality: Right;    KIDNEY SURGERY     LITHOTRIPSY  05/2016   TUBAL LIGATION     Past Medical History:  Diagnosis Date   ACHILLES TENDINITIS    ALLERGIC RHINITIS    Allergy    Anginal pain ([place]) 04/04/2016   Pt currently feels like she is having muscle spasms and aching in her chest   Anxiety    Asthma    Chronic kidney disease    Depression    GERD    History of kidney stones    HYPERTENSION    LATERAL EPICONDYLITIS, RIGHT    LOW BACK PAIN    Plantar fascial fibromatosis    Stress    SUBACROMIAL BURSITIS, RIGHT    Thoracic scoliosis 10/01/2015   TIA (transient ischemic attack)    LMP 08/22/2010   Opioid Risk Score:   Fall Risk Score:  `1  Depression screen [facility] 2/9     12/30/2021   12:41 PM 10/25/2021    3:17 PM 10/17/2021   10:07 AM 10/07/2021   10:30 AM 08/18/2021    1:27 PM 08/18/2021    1:08 PM 08/11/2021   12:14 PM  Depression screen PHQ 2/9  Decreased Interest 1 0 2 0 0 0 1  Down, Depressed, Hopeless 1 0 2 0 0 0 1  PHQ - 2 Score 2 0 4 0 0 0 2  Altered sleeping   1   1   Tired, decreased energy   0   0   Change in appetite   1   0   Feeling bad or failure about yourself    0   0   Trouble concentrating   0   1   Moving slowly or fidgety/restless   2   1   Suicidal thoughts   0   0   PHQ-9 Score   8   3      Review of Systems  Musculoskeletal:  Positive for back pain.  All other systems reviewed and are negative.      Objective:   Physical Exam   Obese female no acute distress Mood and affect appropriate Motor strength is 5/5 bilateral hip flexor knee extensor ankle dorsiflexor SI provocative test Positive compression test Negative distraction test Negative thigh thrust test bilaterally Negative Faber's test bilaterally  There is pain with lumbar flexion as well as with lumbar extension bilaterally.  Pain is centered L4-5 region. There is tenderness along the paraspinals at this level. Negative straight leg raise bilaterally and ambulates without assistive  device no evidence of toe drag or knee instability    Assessment & Plan:   #1.  Chronic low back pain she has had decline in function over the last several months her SI joint pain appears to be fairly well-controlled but believe that she is experiencing more facet arthropathy mediated  pain.  I would like her to attend some physical therapy we will see her back in approximately 6 weeks.  Continue Tylenol No. 4 1 tablet twice daily

## 2022-04-11 NOTE — Telephone Encounter (Signed)
ACETAMINOPHEN-CODEINE Tablet Type of coverage approved: Quantity Opioid 7 Day This approval authorizes your coverage from 03/30/2022 - 02/27/2023, unless we notify you  otherwise, and as long as the following conditions apply:

## 2022-04-12 NOTE — Telephone Encounter (Signed)
approved

## 2022-05-01 ENCOUNTER — Telehealth: Payer: Self-pay | Admitting: Internal Medicine

## 2022-05-01 NOTE — Telephone Encounter (Signed)
PT calls today requesting an RX for lidocaine patches. If possible PT would like this prescription sent to the  [place], [place]  . I did note to PT that I wasn't sure if this would be filled or if she would need to come in first. PT last seen 10/25/21 and is not due for their physical until June.  CB: [id number]

## 2022-05-02 MED ORDER — LIDOCAINE 5 % EX PTCH: 1.0000 | MEDICATED_PATCH | CUTANEOUS | 2 refills | Status: DC

## 2022-05-02 NOTE — Telephone Encounter (Signed)
Ok done to [employer] [facility]

## 2022-05-02 NOTE — Telephone Encounter (Signed)
Patient requesting Rx for Lidocaine patches if possible, LOV 10/25/21

## 2022-05-11 ENCOUNTER — Other Ambulatory Visit (HOSPITAL_COMMUNITY): Payer: Self-pay

## 2022-05-11 ENCOUNTER — Telehealth: Payer: Self-pay | Admitting: Internal Medicine

## 2022-05-11 NOTE — Telephone Encounter (Signed)
Patient called and said her insurance company won't cover lidocaine (LIDODERM) 5 % . They want to know why it was prescribed. She said they would likely need the appointment notes from when it was prescribed.  Best callback is [phone].

## 2022-05-11 NOTE — Telephone Encounter (Signed)
Pharmacy Patient Advocate Encounter   Received notification from [facility] that prior authorization for Lidocaine 5% patches is required/requested.  Per Test Claim: PA required   PA submitted on 05/11/22 to (ins) [employer] via [employer] or [facility]) confirmation # [id number] Status is pending

## 2022-05-19 ENCOUNTER — Encounter: Payer: Medicare HMO | Attending: Physical Medicine and Rehabilitation | Admitting: Physical Medicine & Rehabilitation

## 2022-05-19 ENCOUNTER — Encounter: Payer: Self-pay | Admitting: Physical Medicine & Rehabilitation

## 2022-05-19 VITALS — BP 146/92 | HR 63 | Ht 62.0 in | Wt 151.4 lb

## 2022-05-19 DIAGNOSIS — M961 Postlaminectomy syndrome, not elsewhere classified: Secondary | ICD-10-CM | POA: Insufficient documentation

## 2022-05-19 DIAGNOSIS — M533 Sacrococcygeal disorders, not elsewhere classified: Secondary | ICD-10-CM | POA: Diagnosis not present

## 2022-05-19 MED ORDER — KETOROLAC TROMETHAMINE 60 MG/2ML IM SOLN
30.0000 mg | Freq: Once | INTRAMUSCULAR | Status: AC
  Administered 2022-05-19: 30 mg via INTRAMUSCULAR

## 2022-05-19 NOTE — Progress Notes (Addendum)
Subjective:    Patient ID: [name] Walker, female    DOB: April 28, 1960, 62 y.o.   MRN: [id number] 62 year old female with prior left hemilaminotomy left L5-S1 chronic low back pain she has also buttocks pain.  Imaging studies were reviewed demonstrating multilevel lumbar facet arthropathy noted on MRI from 2020 The patient complains mainly of back pain that is midline just above the waist. She feels like her mobility has been declining over the last 3 months since patient was last seen in this office.  No falls. Right SI injection 10/07/21  Left SI RF 12/30/21 HPI Patient returns with increasing left greater than right-sided low back and buttock pain over the last month.  She has had no new falls or trauma to the area.  She occasionally has pain going down her leg but does not have any persistent numbness or weakness or persistent pain. No bowel or bladder dysfunction.   Pain Inventory Average Pain 10 Pain Right Now 10 My pain is sharp, stabbing, and aching  In the last 24 hours, has pain interfered with the following? General activity 10 Relation with others 10 Enjoyment of life 10 What TIME of day is your pain at its worst? morning , daytime, evening, and night Sleep (in general) Fair  Pain is worse with: walking, bending, sitting, standing, and some activites Pain improves with: heat/ice and medication Relief from Meds: 5  Family History  Problem Relation Age of Onset   Colon cancer Sister        dx in her 70s   Diabetes Mother    Colon cancer Father        dx in his 93s   Prostate cancer Father    Breast cancer Maternal Aunt        50's   Esophageal cancer Neg Hx    Stomach cancer Neg Hx    Social History   Socioeconomic History   Marital status: Legally Separated    Spouse name: Les   Number of children: 3   Years of education: Not on file   Highest education level: Not on file  Occupational History   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  Tobacco Use    Smoking status: Every Day    Packs/day: 0.50    Years: 20.00    Additional pack years: 0.00    Total pack years: 10.00    Types: Cigarettes   Smokeless tobacco: Never  Vaping Use   Vaping Use: Never used  Substance and Sexual Activity   Alcohol use: No   Drug use: No   Sexual activity: Not Currently    Birth control/protection: Post-menopausal, Surgical    Comment: 1st intercourse 62 yo-Fewer than 5 partners-BTL  Other Topics Concern   Not on file  Social History Narrative   Not on file   Social Determinants of Health   Financial Resource Strain: Not on file  Food Insecurity: Not on file  Transportation Needs: Not on file  Physical Activity: Not on file  Stress: Not on file  Social Connections: Not on file   Past Surgical History:  Procedure Laterality Date   ANTERIOR CERVICAL DECOMP/DISCECTOMY FUSION N/A 04/07/2016   Procedure: Cervical two-three Anterior cervical decompression/discectomy/fusion;  Surgeon: Ashok Pall, MD;  Location: [place];  Service: Neurosurgery;  Laterality: N/A;   BACK SURGERY  2015   lower back   EXTRACORPOREAL SHOCK WAVE LITHOTRIPSY Left 09/28/2016   Procedure: LEFT EXTRACORPOREAL SHOCK WAVE LITHOTRIPSY (ESWL);  Surgeon: Festus Aloe, MD;  [facility];  Service: Urology;  Laterality: Left;   EXTRACORPOREAL SHOCK WAVE LITHOTRIPSY Right 04/08/2018   Procedure: EXTRACORPOREAL SHOCK WAVE LITHOTRIPSY (ESWL);  Surgeon: Ardis Hughs, MD;  [facility];  Service: Urology;  Laterality: Right;   KIDNEY SURGERY     LITHOTRIPSY  05/2016   TUBAL LIGATION     Past Surgical History:  Procedure Laterality Date   ANTERIOR CERVICAL DECOMP/DISCECTOMY FUSION N/A 04/07/2016   Procedure: Cervical two-three Anterior cervical decompression/discectomy/fusion;  Surgeon: Ashok Pall, MD;  Location: [place];  Service: Neurosurgery;  Laterality: N/A;   BACK SURGERY  2015   lower back   EXTRACORPOREAL SHOCK WAVE LITHOTRIPSY Left 09/28/2016   Procedure: LEFT  EXTRACORPOREAL SHOCK WAVE LITHOTRIPSY (ESWL);  Surgeon: Festus Aloe, MD;  [facility];  Service: Urology;  Laterality: Left;   EXTRACORPOREAL SHOCK WAVE LITHOTRIPSY Right 04/08/2018   Procedure: EXTRACORPOREAL SHOCK WAVE LITHOTRIPSY (ESWL);  Surgeon: Ardis Hughs, MD;  [facility];  Service: Urology;  Laterality: Right;   KIDNEY SURGERY     LITHOTRIPSY  05/2016   TUBAL LIGATION     Past Medical History:  Diagnosis Date   ACHILLES TENDINITIS    ALLERGIC RHINITIS    Allergy    Anginal pain ([place]) 04/04/2016   Pt currently feels like she is having muscle spasms and aching in her chest   Anxiety    Asthma    Chronic kidney disease    Depression    GERD    History of kidney stones    HYPERTENSION    LATERAL EPICONDYLITIS, RIGHT    LOW BACK PAIN    Plantar fascial fibromatosis    Stress    SUBACROMIAL BURSITIS, RIGHT    Thoracic scoliosis 10/01/2015   TIA (transient ischemic attack)    LMP 08/22/2010   Opioid Risk Score:   Fall Risk Score:  `1  Depression screen [facility] 2/9     12/30/2021   12:41 PM 10/25/2021    3:17 PM 10/17/2021   10:07 AM 10/07/2021   10:30 AM 08/18/2021    1:27 PM 08/18/2021    1:08 PM 08/11/2021   12:14 PM  Depression screen PHQ 2/9  Decreased Interest 1 0 2 0 0 0 1  Down, Depressed, Hopeless 1 0 2 0 0 0 1  PHQ - 2 Score 2 0 4 0 0 0 2  Altered sleeping   1   1   Tired, decreased energy   0   0   Change in appetite   1   0   Feeling bad or failure about yourself    0   0   Trouble concentrating   0   1   Moving slowly or fidgety/restless   2   1   Suicidal thoughts   0   0   PHQ-9 Score   8   3     Review of Systems  Musculoskeletal:  Positive for back pain.       Leg pain  All other systems reviewed and are negative.     Objective:   Physical Exam Vitals and nursing note reviewed.  Constitutional:      Appearance: She is normal weight.  HENT:     Head: Normocephalic and atraumatic.  Eyes:     Extraocular Movements:  Extraocular movements intact.     Pupils: Pupils are equal, round, and reactive to light.  Musculoskeletal:     Right lower leg: No edema.  Left lower leg: No edema.     Comments: Patient has tenderness palpation bilateral PSIS area  Sacral thrust (prone) : Positive  FABER's: Positive on left and the SI area Distraction (supine): Positive on left Thigh thrust test: Positive on left greater than right  Skin:    General: Skin is warm and dry.  Neurological:     Mental Status: She is alert and oriented to person, place, and time.  Psychiatric:        Mood and Affect: Mood normal.        Behavior: Behavior normal.           Assessment & Plan:   #1.  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  We discussed that sacroiliac RF is not being covered by insurance at this time.  Would repeat left sacroiliac injection with corticosteroid.  May need follow-up physical therapy  Patient complains of pain today she would like to get injection we discussed that she will need to be scheduled for the sacroiliac injection.  Will give Toradol 30 mg IM today

## 2022-05-19 NOTE — Patient Instructions (Signed)
Back Exercises These exercises help to make your trunk and back strong. They also help to keep the lower back flexible. Doing these exercises can help to prevent or lessen pain in your lower back. If you have back pain, try to do these exercises 2-3 times each day or as told by your doctor. As you get better, do the exercises once each day. Repeat the exercises more often as told by your doctor. To stop back pain from coming back, do the exercises once each day, or as told by your doctor. Do exercises exactly as told by your doctor. Stop right away if you feel sudden pain or your pain gets worse. Exercises Single knee to chest Do these steps 3-5 times in a row for each leg: Lie on your back on a firm bed or the floor with your legs stretched out. Bring one knee to your chest. Grab your knee or thigh with both hands and hold it in place. Pull on your knee until you feel a gentle stretch in your lower back or butt. Keep doing the stretch for 10-30 seconds. Slowly let go of your leg and straighten it. Pelvic tilt Do these steps 5-10 times in a row: Lie on your back on a firm bed or the floor with your legs stretched out. Bend your knees so they point up to the ceiling. Your feet should be flat on the floor. Tighten your lower belly (abdomen) muscles to press your lower back against the floor. This will make your tailbone point up to the ceiling instead of pointing down to your feet or the floor. Stay in this position for 5-10 seconds while you gently tighten your muscles and breathe evenly. Cat-cow Do these steps until your lower back bends more easily: Get on your hands and knees on a firm bed or the floor. Keep your hands under your shoulders, and keep your knees under your hips. You may put padding under your knees. Let your head hang down toward your chest. Tighten (contract) the muscles in your belly. Point your tailbone toward the floor so your lower back becomes rounded like the back of a  cat. Stay in this position for 5 seconds. Slowly lift your head. Let the muscles of your belly relax. Point your tailbone up toward the ceiling so your back forms a sagging arch like the back of a cow. Stay in this position for 5 seconds.  Press-ups Do these steps 5-10 times in a row: Lie on your belly (face-down) on a firm bed or the floor. Place your hands near your head, about shoulder-width apart. While you keep your back relaxed and keep your hips on the floor, slowly straighten your arms to raise the top half of your body and lift your shoulders. Do not use your back muscles. You may change where you place your hands to make yourself more comfortable. Stay in this position for 5 seconds. Keep your back relaxed. Slowly return to lying flat on the floor.  Bridges Do these steps 10 times in a row: Lie on your back on a firm bed or the floor. Bend your knees so they point up to the ceiling. Your feet should be flat on the floor. Your arms should be flat at your sides, next to your body. Tighten your butt muscles and lift your butt off the floor until your waist is almost as high as your knees. If you do not feel the muscles working in your butt and the back of   your thighs, slide your feet 1-2 inches (2.5-5 cm) farther away from your butt. Stay in this position for 3-5 seconds. Slowly lower your butt to the floor, and let your butt muscles relax. If this exercise is too easy, try doing it with your arms crossed over your chest. Belly crunches Do these steps 5-10 times in a row: Lie on your back on a firm bed or the floor with your legs stretched out. Bend your knees so they point up to the ceiling. Your feet should be flat on the floor. Cross your arms over your chest. Tip your chin a little bit toward your chest, but do not bend your neck. Tighten your belly muscles and slowly raise your chest just enough to lift your shoulder blades a tiny bit off the floor. Avoid raising your body  higher than that because it can put too much stress on your lower back. Slowly lower your chest and your head to the floor. Back lifts Do these steps 5-10 times in a row: Lie on your belly (face-down) with your arms at your sides, and rest your forehead on the floor. Tighten the muscles in your legs and your butt. Slowly lift your chest off the floor while you keep your hips on the floor. Keep the back of your head in line with the curve in your back. Look at the floor while you do this. Stay in this position for 3-5 seconds. Slowly lower your chest and your face to the floor. Contact a doctor if: Your back pain gets a lot worse when you do an exercise. Your back pain does not get better within 2 hours after you exercise. If you have any of these problems, stop doing the exercises. Do not do them again unless your doctor says it is okay. Get help right away if: You have sudden, very bad back pain. If this happens, stop doing the exercises. Do not do them again unless your doctor says it is okay. This information is not intended to replace advice given to you by your health care provider. Make sure you discuss any questions you have with your health care provider. Document Revised: 04/28/2020 Document Reviewed: 04/28/2020 [employer] Patient Education  2023 [employer]  

## 2022-05-22 ENCOUNTER — Ambulatory Visit (HOSPITAL_BASED_OUTPATIENT_CLINIC_OR_DEPARTMENT_OTHER): Payer: Medicare HMO | Attending: Physical Medicine & Rehabilitation | Admitting: Physical Therapy

## 2022-05-22 DIAGNOSIS — M47817 Spondylosis without myelopathy or radiculopathy, lumbosacral region: Secondary | ICD-10-CM | POA: Diagnosis not present

## 2022-05-22 DIAGNOSIS — M6281 Muscle weakness (generalized): Secondary | ICD-10-CM

## 2022-05-22 DIAGNOSIS — M533 Sacrococcygeal disorders, not elsewhere classified: Secondary | ICD-10-CM | POA: Diagnosis not present
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
## 2022-05-23 ENCOUNTER — Encounter (HOSPITAL_BASED_OUTPATIENT_CLINIC_OR_DEPARTMENT_OTHER): Payer: Self-pay | Admitting: Physical Therapy

## 2022-05-26 NOTE — Telephone Encounter (Signed)
Pharmacy Patient Advocate Encounter  Received notification from [place] that the request for prior authorization for  Lidocaine 5% patches  has been denied due to not meeting criteria for prior authorization.       Please be advised we currently do not have a Pharmacist to review denials, therefore you will need to process appeals accordingly as needed. Thanks for your support at this time.   You may call [phone] or fax [phone], to appeal. E-appeal is also available [employer])

## 2022-06-01 ENCOUNTER — Encounter (HOSPITAL_BASED_OUTPATIENT_CLINIC_OR_DEPARTMENT_OTHER): Payer: Self-pay | Admitting: Physical Therapy

## 2022-06-01 ENCOUNTER — Ambulatory Visit (HOSPITAL_BASED_OUTPATIENT_CLINIC_OR_DEPARTMENT_OTHER): Payer: Medicare HMO | Attending: Physical Medicine & Rehabilitation | Admitting: Physical Therapy

## 2022-06-01 DIAGNOSIS — M6281 Muscle weakness (generalized): Secondary | ICD-10-CM | POA: Diagnosis not present

## 2022-06-01 DIAGNOSIS — M533 Sacrococcygeal disorders, not elsewhere classified: Secondary | ICD-10-CM | POA: Insufficient documentation

## 2022-06-01 DIAGNOSIS — M47817 Spondylosis without myelopathy or radiculopathy, lumbosacral region: Secondary | ICD-10-CM

## 2022-06-01 NOTE — Therapy (Signed)
OUTPATIENT PHYSICAL THERAPY THORACOLUMBAR TREATMENT   Patient Name: [name] Walker MRN: [id number] DOB:1960/11/08, 62 y.o., female Today's Date: 06/01/2022  END OF SESSION:  PT End of Session - 06/01/22 1405     Visit Number 2    Number of Visits 12    Date for PT Re-Evaluation 07/17/22    Authorization Type [employer] MCR    PT Start Time 1400    PT Stop Time 1440    PT Time Calculation (min) 40 min    Behavior During Therapy WFL for tasks assessed/performed             Past Medical History:  Diagnosis Date   ACHILLES TENDINITIS    ALLERGIC RHINITIS    Allergy    Anginal pain 04/04/2016   Pt currently feels like she is having muscle spasms and aching in her chest   Anxiety    Asthma    Chronic kidney disease    Depression    GERD    History of kidney stones    HYPERTENSION    LATERAL EPICONDYLITIS, RIGHT    LOW BACK PAIN    Plantar fascial fibromatosis    Stress    SUBACROMIAL BURSITIS, RIGHT    Thoracic scoliosis 10/01/2015   TIA (transient ischemic attack)    Past Surgical History:  Procedure Laterality Date   ANTERIOR CERVICAL DECOMP/DISCECTOMY FUSION N/A 04/07/2016   Procedure: Cervical two-three Anterior cervical decompression/discectomy/fusion;  Surgeon: Ashok Pall, MD;  Location: [facility];  Service: Neurosurgery;  Laterality: N/A;   BACK SURGERY  2015   lower back   EXTRACORPOREAL SHOCK WAVE LITHOTRIPSY Left 09/28/2016   Procedure: LEFT EXTRACORPOREAL SHOCK WAVE LITHOTRIPSY (ESWL);  Surgeon: Festus Aloe, MD;  Location: [facility];  Service: Urology;  Laterality: Left;   EXTRACORPOREAL SHOCK WAVE LITHOTRIPSY Right 04/08/2018   Procedure: EXTRACORPOREAL SHOCK WAVE LITHOTRIPSY (ESWL);  Surgeon: Ardis Hughs, MD;  Location: [facility];  Service: Urology;  Laterality: Right;   KIDNEY SURGERY     LITHOTRIPSY  05/2016   TUBAL LIGATION     Patient Active Problem List   Diagnosis Date Noted   Left otitis media 10/26/2021   Bacterial ear infection, bilateral  10/17/2021   Pain of right sacroiliac joint 10/07/2021   Chronic low back pain with left-sided sciatica 03/31/2020   Lumbar disc disease 03/02/2020   Left sided sciatica 01/31/2020   Depression 01/29/2020   Hematuria 01/29/2020   Left-sided weakness 10/31/2019   Balance disorder 10/31/2019   Difficulty with speech 10/02/2019   Insomnia 10/02/2019   HLD (hyperlipidemia) 04/14/2019   Acute otalgia, bilateral 04/14/2019   Vitamin D deficiency 04/14/2019   Anxiety with depression 10/06/2018   Neck pain 09/10/2018   Hand swelling 09/10/2018   Bilateral radiating leg pain 04/23/2018   Myalgia 04/23/2018   Right renal stone 04/03/2018   Smoker 04/29/2016   Bilateral hand pain 04/29/2016   HNP (herniated nucleus pulposus), cervical 04/07/2016   Cervical radiculopathy, acute 01/29/2016   Acute upper respiratory infection 01/29/2016   Degenerative disc disease, cervical 01/11/2016   Hyperglycemia 11/17/2015   Angioedema 10/12/2015   Bilateral lower extremity edema 10/12/2015   Mouth sores 10/12/2015   Hypokalemia 10/12/2015   Thoracic scoliosis 10/01/2015   Peripheral edema 10/01/2015   Eczema 10/01/2015   Asthma 10/01/2015   Breast lump on right side at 4 o'clock position 01/21/2014   Hot flashes 10/01/2013   Lumbar radiculopathy 07/17/2012   Grief reaction 07/17/2012   Family history of colon cancer 07/02/2012  OAB (overactive bladder) 03/01/2012   Encounter for well adult exam with abnormal findings 08/23/2010   Plantar fasciitis 08/23/2010   Dysuria 01/05/2010   UTI (urinary tract infection) 10/19/2008   ANKLE PAIN, BILATERAL 10/19/2008   VAGINITIS 09/10/2008   JOINT EFFUSION, ANKLE 09/10/2008   SUBACROMIAL BURSITIS, RIGHT 09/01/2008   ACHILLES TENDINITIS 09/01/2008   LATERAL EPICONDYLITIS, RIGHT 08/18/2008   Essential hypertension 07/12/2007   Allergic rhinitis 07/12/2007   GERD 07/12/2007   Chronic low back pain 07/12/2007   NEPHROLITHIASIS, HX OF 07/12/2007     PCP: Biagio Borg, MD   REFERRING PROVIDER: Charlett Blake, MD   REFERRING DIAG:  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  She cannot swim and is afraid of being in the water.    PERTINENT HISTORY:  Postlaminectomy syndrome, lumbar region    PAIN:  Are you having pain? Yes: NPRS scale: 7/10 Pain location: SIJ R Pain description: ache; throb, sharpe constant Aggravating factors: walking> 200 ft; ~20 mins standing; bending down Relieving factors: meds; lying on heating pad  PRECAUTIONS: Back  WEIGHT BEARING RESTRICTIONS: No  FALLS:  Has patient fallen in last 6 months? No  LIVING ENVIRONMENT: Lives with: lives with their family Lives in: House/apartment Stairs: Yes: External: 3 steps; on right going up Has following equipment at home: Single point cane  OCCUPATION: retired  PLOF: Independent  PATIENT GOALS: walk around and do my normal thing I used to do.  NEXT MD VISIT: next month  OBJECTIVE:   DIAGNOSTIC FINDINGS:  None in chart As per Dr Letta Pate: Imaging studies were reviewed demonstrating multilevel lumbar facet arthropathy noted on MRI from 2020   PATIENT SURVEYS:  FOTO Primary score: 30% with goal 43% 14th visit  SCREENING FOR RED FLAGS: none  COGNITION: Overall cognitive status: Within functional limits for tasks assessed     SENSATION: Pt reports radicular pain into ankles ~ twice week after standing too  long.  MUSCLE LENGTH: Hamstrings: Right 70 deg; Left 80 deg   POSTURE: left pelvic obliquity slight  PALPATION: Moderate TTP throughout SI area  LUMBAR ROM:  wfl  LOWER EXTREMITY ROM:  wfl  LOWER EXTREMITY MMT:    MMT Right eval Left eval  Hip flexion 4+ 3+  Hip extension    Hip abduction 5 5  Hip adduction 5 5  Hip internal rotation    Hip external rotation    Knee flexion 5 5  Knee extension 5 5  Ankle dorsiflexion    Ankle plantarflexion    Ankle inversion    Ankle eversion     (Blank rows = not tested)  LUMBAR SPECIAL TESTS:  Straight leg raise test: Negative, Slump test: Negative, Stork standing: Negative, FABER test: positive, and Thomas test: Negative  FUNCTIONAL TESTS:  30 seconds chair stand test: 3 Timed up and go (TUG): 17.36 4 stage balance: WFL  GAIT: Distance walked: 400 ft Assistive device utilized: None Level of assistance: Complete Independence Comments: antalgic, slowed cadence  TODAY'S TREATMENT:                                                                                                                              Pt seen for aquatic therapy today.  Treatment took place in water 3.5-3.75 ft in depth at the [facility] pool. Temp of water was 91.  Pt entered/exited the pool via stairs independently with bilat rail. * intro to aquatic therapy * holding wall:  side stepping R/L,  relaxed squats x 5,  heel raises x 5 * holding wall and yellow hand float:  walking forward/ backward * holding white barbell next to wall: walking forward/ backward and side stepping * one attempt to walk 1/2 width of pool * holding wall:  relaxed squats x 5, hip abdct/ addct x 5 each (increased pain); squats x 5 * holding rails:  fig 4 piriformis stretch with demo and cues x 20s x 2 each LE  Pt requires the buoyancy and hydrostatic pressure of water for support, and to offload joints by unweighting joint load by at least 50 % in navel deep water  and by at least 75-80% in chest to neck deep water.  Viscosity of the water is needed for resistance of strengthening. Water current perturbations provides challenge to standing balance requiring increased core activation.      PATIENT EDUCATION:  Education details:aquatic therapy intro   Person educated: Patient Education method: Explanation Education comprehension: verbalized understanding  HOME EXERCISE PROGRAM: TBA  ASSESSMENT:  CLINICAL IMPRESSION: Pt verbalized fear of being in water at initiation of session.  She was able to progress from holding wall, to holding white barbell next to wall.  She was unsteady and more guarded when attempting to walk the width of the pool (made it 1/2 way).  She reported some Inc in Rt SI pain with hip ext during retro gait and with Rt hip abdct.  Fig4 stretch tolerated much better at 2nd rep.  Pt was able to take direction from therapist on deck.  Anticipate improved acclimatization to water in future visits.  Goals are ongoing.  FROM EVAL: Patient is a 62 y.o. f who was seen today for physical therapy evaluation and treatment for SIJ disease and lumbosacral spondylosis. She has had chronic LBP for several years with surgical intervention >5 yrs ago. Pain appears to be multifactorial with SIJ and as per MD "facet arthropathy mediated" pain.  She tests with only minor weakness in hips but is highly pain sensitive with movement having significant difficulty with functional mobility (STS and sup<>sit).  She will benefit from skilled physical therapy intervention.  Will plan to begin aquatic based as she will benefit from the properties of water to reduce pain, improve movement and hip strength then will transition to land based for further progression.    OBJECTIVE IMPAIRMENTS: decreased activity tolerance, decreased mobility, difficulty walking, decreased strength, improper body mechanics, postural dysfunction, and pain.   ACTIVITY LIMITATIONS:  carrying, lifting, bending, sitting, standing, squatting, stairs, transfers, and bed mobility  PARTICIPATION LIMITATIONS: meal prep, cleaning, shopping, community activity, and yard work  PERSONAL FACTORS: Fitness and Time since onset of injury/illness/exacerbation are also affecting patient's functional outcome.   REHAB POTENTIAL: Good  CLINICAL DECISION MAKING: Evolving/moderate complexity  EVALUATION COMPLEXITY: Moderate   GOALS: Goals reviewed with patient? Yes  SHORT TERM GOALS: Target date: 07/16/22  Pt will tolerate full aquatic sessions consistently without increase in pain and with improving function to demonstrate good toleration and effectiveness of intervention.   Baseline: Goal status: INITIAL  2.  Pt will tolerate STS submerged from bench onto blue step x10 consecutive reps without pain Baseline: unable on land Goal status: INITIAL  3.  Pt will report decrease in overall  pain to </= 6/10 for improved toleration to activity/quality of life and to demonstrate improved management of pain.  Baseline: 9-10/10 Goal status: INITIAL  4.  Pt will tolerate stair climbing using alternating pattern ascending and descending 6 steps without use of handrail in aquatic setting Baseline: step to and painful Goal status: INITIAL   LONG TERM GOALS: Target date: 07/17/22  Pt to meet stated Foto Goal of 43% Baseline: 30% Goal status: INITIAL  2.  Pt will be indep with final HEP's (land and aquatic as appropriate) for continued management of condition  Baseline: none Goal status: INITIAL  3.  Pt will improve on 30s STS test to >or=  7  to demonstrate improving functional lower extremity strength, transitional movements, and balance  Baseline: 3 Goal status: INITIAL  4.  Pt will improve on Tug test to <or= 10s to demonstrate improvement in lower extremity function, mobility and decreased fall risk.  Baseline: 17.36 Goal status: INITIAL  5.  Pt will report decrease in worst  pain to </= 4/10 for improved toleration to activity/quality of life and to demonstrate improved management of pain.  Baseline: 9-10/10 Goal status: INITIAL  6.  Pt will report improved toleration to activity on daily basis as prior to the onset of skilled physical therapy intervention. Baseline: minimal toleration to any activity Goal status: INITIAL  PLAN:  PT FREQUENCY: 1-2x/week  PT DURATION: 8 weeks  12 visit (allowing for scheduling conflicts)  PLANNED INTERVENTIONS: Therapeutic exercises, Therapeutic activity, Neuromuscular re-education, Balance training, Gait training, Patient/Family education, Self Care, Joint mobilization, Stair training, DME instructions, Aquatic Therapy, Dry Needling, Taping, Ionotophoresis 4mg /ml Dexamethasone, Manual therapy, and Re-evaluation.  PLAN FOR NEXT SESSION: aquatic LE and lumbosacral mobility and strength; stretching, stair and transfer training, pain relief Land: body mechanics; sup<>sit/STS transfer, stair climbing; lumbosacral mobilization/strengthening; stretching program   Kerin Perna, PTA 06/01/22  2:54 PM [facility] [address] [place], [place], [address] Phone: [phone]   Fax:  [phone]

## 2022-06-06 ENCOUNTER — Other Ambulatory Visit: Payer: Self-pay | Admitting: Physical Medicine & Rehabilitation

## 2022-06-06 ENCOUNTER — Encounter (HOSPITAL_BASED_OUTPATIENT_CLINIC_OR_DEPARTMENT_OTHER): Payer: Self-pay | Admitting: Physical Therapy

## 2022-06-06 ENCOUNTER — Ambulatory Visit (HOSPITAL_BASED_OUTPATIENT_CLINIC_OR_DEPARTMENT_OTHER): Payer: Medicare HMO | Admitting: Physical Therapy

## 2022-06-06 DIAGNOSIS — M533 Sacrococcygeal disorders, not elsewhere classified: Secondary | ICD-10-CM | POA: Diagnosis not present

## 2022-06-06 DIAGNOSIS — M6281 Muscle weakness (generalized): Secondary | ICD-10-CM

## 2022-06-06 DIAGNOSIS — M47817 Spondylosis without myelopathy or radiculopathy, lumbosacral region: Secondary | ICD-10-CM | POA: Diagnosis not present

## 2022-06-06 NOTE — Therapy (Signed)
OUTPATIENT PHYSICAL THERAPY THORACOLUMBAR TREATMENT   Patient Name: [name] Walker MRN: [id number] DOB:07-02-1960, 62 y.o., female Today's Date: 06/06/2022  END OF SESSION:  PT End of Session - 06/06/22 1354     Visit Number 3    Number of Visits 12    Date for PT Re-Evaluation 07/17/22    PT Start Time 1400    PT Stop Time 1445    PT Time Calculation (min) 45 min    Activity Tolerance Patient tolerated treatment well    Behavior During Therapy [facility] for tasks assessed/performed             Past Medical History:  Diagnosis Date   ACHILLES TENDINITIS    ALLERGIC RHINITIS    Allergy    Anginal pain 04/04/2016   Pt currently feels like she is having muscle spasms and aching in her chest   Anxiety    Asthma    Chronic kidney disease    Depression    GERD    History of kidney stones    HYPERTENSION    LATERAL EPICONDYLITIS, RIGHT    LOW BACK PAIN    Plantar fascial fibromatosis    Stress    SUBACROMIAL BURSITIS, RIGHT    Thoracic scoliosis 10/01/2015   TIA (transient ischemic attack)    Past Surgical History:  Procedure Laterality Date   ANTERIOR CERVICAL DECOMP/DISCECTOMY FUSION N/A 04/07/2016   Procedure: Cervical two-three Anterior cervical decompression/discectomy/fusion;  Surgeon: Coletta Memos, MD;  Location: [facility] [place] [facility];  Service: Neurosurgery;  Laterality: N/A;   BACK SURGERY  2015   lower back   EXTRACORPOREAL SHOCK WAVE LITHOTRIPSY Left 09/28/2016   Procedure: LEFT EXTRACORPOREAL SHOCK WAVE LITHOTRIPSY (ESWL);  Surgeon: Jerilee Field, MD;  Location: [facility] ORS;  Service: Urology;  Laterality: Left;   EXTRACORPOREAL SHOCK WAVE LITHOTRIPSY Right 04/08/2018   Procedure: EXTRACORPOREAL SHOCK WAVE LITHOTRIPSY (ESWL);  Surgeon: Crist Fat, MD;  Location: [facility] OR[facility];  Service: Urology;  Laterality: Right;   KIDNEY SURGERY     LITHOTRIPSY  05/2016   TUBAL LIGATION     Patient Active Problem List   Diagnosis Date Noted   Left otitis media 10/26/2021   Bacterial ear  infection, bilateral 10/17/2021   Pain of right sacroiliac joint 10/07/2021   Chronic low back pain with left-sided sciatica 03/31/2020   Lumbar disc disease 03/02/2020   Left sided sciatica 01/31/2020   Depression 01/29/2020   Hematuria 01/29/2020   Left-sided weakness 10/31/2019   Balance disorder 10/31/2019   Difficulty with speech 10/02/2019   Insomnia 10/02/2019   HLD (hyperlipidemia) 04/14/2019   Acute otalgia, bilateral 04/14/2019   Vitamin D deficiency 04/14/2019   Anxiety with depression 10/06/2018   Neck pain 09/10/2018   Hand swelling 09/10/2018   Bilateral radiating leg pain 04/23/2018   Myalgia 04/23/2018   Right renal stone 04/03/2018   Smoker 04/29/2016   Bilateral hand pain 04/29/2016   HNP (herniated nucleus pulposus), cervical 04/07/2016   Cervical radiculopathy, acute 01/29/2016   Acute upper respiratory infection 01/29/2016   Degenerative disc disease, cervical 01/11/2016   Hyperglycemia 11/17/2015   Angioedema 10/12/2015   Bilateral lower extremity edema 10/12/2015   Mouth sores 10/12/2015   Hypokalemia 10/12/2015   Thoracic scoliosis 10/01/2015   Peripheral edema 10/01/2015   Eczema 10/01/2015   Asthma 10/01/2015   Breast lump on right side at 4 o'clock position 01/21/2014   Hot flashes 10/01/2013   Lumbar radiculopathy 07/17/2012   Grief reaction 07/17/2012   Family history of colon  cancer 07/02/2012   OAB (overactive bladder) 03/01/2012   Encounter for well adult exam with abnormal findings 08/23/2010   Plantar fasciitis 08/23/2010   Dysuria 01/05/2010   UTI (urinary tract infection) 10/19/2008   ANKLE PAIN, BILATERAL 10/19/2008   VAGINITIS 09/10/2008   JOINT EFFUSION, ANKLE 09/10/2008   SUBACROMIAL BURSITIS, RIGHT 09/01/2008   ACHILLES TENDINITIS 09/01/2008   LATERAL EPICONDYLITIS, RIGHT 08/18/2008   Essential hypertension 07/12/2007   Allergic rhinitis 07/12/2007   GERD 07/12/2007   Chronic low back pain 07/12/2007   NEPHROLITHIASIS, HX  OF 07/12/2007    PCP: Corwin LevinsJohn, James W, MD   REFERRING PROVIDER: Erick ColaceKirsteins, Andrew E, MD   REFERRING DIAG:  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    PERTINENT HISTORY:  Postlaminectomy syndrome, lumbar region    PAIN:  Are you having pain? Yes: NPRS scale: 7/10 Pain location: SIJ R Pain description: ache; throb, sharpe constant Aggravating factors: walking> 200 ft; ~20 mins standing; bending down Relieving factors: meds; lying on heating pad  PRECAUTIONS: Back  WEIGHT BEARING RESTRICTIONS: No  FALLS:  Has patient fallen in last 6 months? No  LIVING ENVIRONMENT: Lives with: lives with their family Lives in: House/apartment Stairs: Yes: External: 3 steps; on right going up Has following equipment at home: Single point cane  OCCUPATION: retired  PLOF: Independent  PATIENT GOALS: walk around and do my normal thing I used to do.  NEXT MD VISIT: next month  OBJECTIVE:   DIAGNOSTIC FINDINGS:  None in chart As per Dr Wynn BankerKirsteins: Imaging studies were reviewed demonstrating multilevel lumbar facet arthropathy noted on MRI from 2020   PATIENT SURVEYS:  FOTO Primary score: 30% with goal 43% 14th visit  SCREENING FOR RED FLAGS: none  COGNITION: Overall cognitive status: Within functional limits for tasks assessed     SENSATION: Pt reports radicular pain into ankles ~ twice week after standing too  long.  MUSCLE LENGTH: Hamstrings: Right 70 deg; Left 80 deg   POSTURE: left pelvic obliquity slight  PALPATION: Moderate TTP throughout SI area  LUMBAR ROM:  wfl  LOWER EXTREMITY ROM:     wfl  LOWER EXTREMITY MMT:    MMT Right eval Left eval  Hip flexion 4+ 3+  Hip extension    Hip abduction 5 5  Hip adduction 5 5  Hip internal rotation    Hip external rotation    Knee flexion 5 5  Knee extension 5 5  Ankle dorsiflexion    Ankle plantarflexion    Ankle inversion    Ankle eversion     (Blank rows = not tested)  LUMBAR SPECIAL TESTS:  Straight leg raise test: Negative, Slump test: Negative, Stork standing: Negative, FABER test: positive, and Thomas test: Negative  FUNCTIONAL TESTS:  30 seconds chair stand test: 3 Timed up and go (TUG): 17.36 4 stage balance: WFL  GAIT: Distance walked: 400 ft Assistive device utilized: None Level of assistance: Complete Independence Comments: antalgic, slowed cadence  TODAY'S TREATMENT:                                                                                                                              Pt seen for aquatic therapy today.  Treatment took place in water 3.5-3.75 ft in depth at the [facility] pool. Temp of water was 91.  Pt entered/exited the pool via stairs independently with bilat rail.  *Ue support white barbell: forward and backward amb x 4 widths ea.  Side stepping x 2.  Minor unsteadiness which she recovers from indep  - seated rest period * holding wall:  df; pf; hip abd; hip add x 10 reps.  - seated rest period *Seated hamstring and gastroc stretch *Standing ue support on wall: squats; hip extension; x10 *Seated on bench feet on step resisted hip abd using lt blue aqua band *Side stepping with band 8 steps R and L x 2 sets *Standing leaning on wall 3.6 ft: SKTC R and L with 20s hold x 2. Requires therapists hands on shoulders for  confidence in position.  Pt requires the buoyancy and  hydrostatic pressure of water for support, and to offload joints by unweighting joint load by at least 50 % in navel deep water and by at least 75-80% in chest to neck deep water.  Viscosity of the water is needed for resistance of strengthening. Water current perturbations provides challenge to standing balance requiring increased core activation.      PATIENT EDUCATION:  Education details:aquatic therapy intro   Person educated: Patient Education method: Explanation Education comprehension: verbalized understanding  HOME EXERCISE PROGRAM: TBA  ASSESSMENT:  CLINICAL IMPRESSION: Pt with less apprehension submerged.  Able to walk width of pool in all directions.  She does reports right sided SIJ pain with amb. Focused on gentle movements of LE and stretching as tolerated.  She does report increase in pain upon completion. Finished in 103d jacuzzi for massage SI area (unbilled). Goals ongoing. Trial pelvic tilting next visit.  FROM EVAL: Patient is a 62 y.o. f who was seen  today for physical therapy evaluation and treatment for SIJ disease and lumbosacral spondylosis. She has had chronic LBP for several years with surgical intervention >5 yrs ago. Pain appears to be multifactorial with SIJ and as per MD "facet arthropathy mediated" pain.  She tests with only minor weakness in hips but is highly pain sensitive with movement having significant difficulty with functional mobility (STS and sup<>sit).  She will benefit from skilled physical therapy intervention.  Will plan to begin aquatic based as she will benefit from the properties of water to reduce pain, improve movement and hip strength then will transition to land based for further progression.    OBJECTIVE IMPAIRMENTS: decreased activity tolerance, decreased mobility, difficulty walking, decreased strength, improper body mechanics, postural dysfunction, and pain.   ACTIVITY LIMITATIONS: carrying, lifting, bending, sitting, standing,  squatting, stairs, transfers, and bed mobility  PARTICIPATION LIMITATIONS: meal prep, cleaning, shopping, community activity, and yard work  PERSONAL FACTORS: Fitness and Time since onset of injury/illness/exacerbation are also affecting patient's functional outcome.   REHAB POTENTIAL: Good  CLINICAL DECISION MAKING: Evolving/moderate complexity  EVALUATION COMPLEXITY: Moderate   GOALS: Goals reviewed with patient? Yes  SHORT TERM GOALS: Target date: 07/16/22  Pt will tolerate full aquatic sessions consistently without increase in pain and with improving function to demonstrate good toleration and effectiveness of intervention.   Baseline: Goal status: INITIAL  2.  Pt will tolerate STS submerged from bench onto blue step x10 consecutive reps without pain Baseline: unable on land Goal status: INITIAL  3.  Pt will report decrease in overall  pain to </= 6/10 for improved toleration to activity/quality of life and to demonstrate improved management of pain.  Baseline: 9-10/10 Goal status: INITIAL  4.  Pt will tolerate stair climbing using alternating pattern ascending and descending 6 steps without use of handrail in aquatic setting Baseline: step to and painful Goal status: INITIAL   LONG TERM GOALS: Target date: 07/17/22  Pt to meet stated Foto Goal of 43% Baseline: 30% Goal status: INITIAL  2.  Pt will be indep with final HEP's (land and aquatic as appropriate) for continued management of condition  Baseline: none Goal status: INITIAL  3.  Pt will improve on 30s STS test to >or=  7  to demonstrate improving functional lower extremity strength, transitional movements, and balance  Baseline: 3 Goal status: INITIAL  4.  Pt will improve on Tug test to <or= 10s to demonstrate improvement in lower extremity function, mobility and decreased fall risk.  Baseline: 17.36 Goal status: INITIAL  5.  Pt will report decrease in worst pain to </= 4/10 for improved toleration to  activity/quality of life and to demonstrate improved management of pain.  Baseline: 9-10/10 Goal status: INITIAL  6.  Pt will report improved toleration to activity on daily basis as prior to the onset of skilled physical therapy intervention. Baseline: minimal toleration to any activity Goal status: INITIAL  PLAN:  PT FREQUENCY: 1-2x/week  PT DURATION: 8 weeks  12 visit (allowing for scheduling conflicts)  PLANNED INTERVENTIONS: Therapeutic exercises, Therapeutic activity, Neuromuscular re-education, Balance training, Gait training, Patient/Family education, Self Care, Joint mobilization, Stair training, DME instructions, Aquatic Therapy, Dry Needling, Taping, Ionotophoresis 4mg /ml Dexamethasone, Manual therapy, and Re-evaluation.  PLAN FOR NEXT SESSION: aquatic LE and lumbosacral mobility and strength; stretching, stair and transfer training, pain relief Land: body mechanics; sup<>sit/STS transfer, stair climbing; lumbosacral mobilization/strengthening; stretching program   Corrie Dandy Tomma Lightning) Aleza Pew MPT 06/06/22 2:48 PM [facility] [phone]  [place], [place], [address] Phone: [phone]   Fax:  [phone]

## 2022-06-07 NOTE — Telephone Encounter (Signed)
Per pmp last fill   05/15/2022 04/07/2022 1  Acetaminophen-Cod #4 Tablet 60.00 30 [place] [id number] [facility] ([id number]) 1/1 18.00 MME Medicare [place]

## 2022-06-14 ENCOUNTER — Encounter (HOSPITAL_BASED_OUTPATIENT_CLINIC_OR_DEPARTMENT_OTHER): Payer: Self-pay | Admitting: Physical Therapy

## 2022-06-14 ENCOUNTER — Ambulatory Visit (HOSPITAL_BASED_OUTPATIENT_CLINIC_OR_DEPARTMENT_OTHER): Payer: Medicare HMO | Admitting: Physical Therapy

## 2022-06-14 DIAGNOSIS — M47817 Spondylosis without myelopathy or radiculopathy, lumbosacral region: Secondary | ICD-10-CM

## 2022-06-14 DIAGNOSIS — M6281 Muscle weakness (generalized): Secondary | ICD-10-CM

## 2022-06-14 DIAGNOSIS — M533 Sacrococcygeal disorders, not elsewhere classified: Secondary | ICD-10-CM | POA: Diagnosis not present

## 2022-06-14 NOTE — Therapy (Signed)
OUTPATIENT PHYSICAL THERAPY THORACOLUMBAR TREATMENT   Patient Name: [name] Walker MRN: [id number] DOB:08-27-1960, 62 y.o., female Today's Date: 06/14/2022  END OF SESSION:  PT End of Session - 06/14/22 1300     Visit Number 4    Number of Visits 12    Date for PT Re-Evaluation 07/17/22    Authorization Type [employer] MCR    PT Start Time 1204    PT Stop Time 1245    PT Time Calculation (min) 41 min    Activity Tolerance Patient tolerated treatment well    Behavior During Therapy WFL for tasks assessed/performed              Past Medical History:  Diagnosis Date   ACHILLES TENDINITIS    ALLERGIC RHINITIS    Allergy    Anginal pain 04/04/2016   Pt currently feels like she is having muscle spasms and aching in her chest   Anxiety    Asthma    Chronic kidney disease    Depression    GERD    History of kidney stones    HYPERTENSION    LATERAL EPICONDYLITIS, RIGHT    LOW BACK PAIN    Plantar fascial fibromatosis    Stress    SUBACROMIAL BURSITIS, RIGHT    Thoracic scoliosis 10/01/2015   TIA (transient ischemic attack)    Past Surgical History:  Procedure Laterality Date   ANTERIOR CERVICAL DECOMP/DISCECTOMY FUSION N/A 04/07/2016   Procedure: Cervical two-three Anterior cervical decompression/discectomy/fusion;  Surgeon: Coletta Memos, MD;  Location: [facility] OR;  Service: Neurosurgery;  Laterality: N/A;   BACK SURGERY  2015   lower back   EXTRACORPOREAL SHOCK WAVE LITHOTRIPSY Left 09/28/2016   Procedure: LEFT EXTRACORPOREAL SHOCK WAVE LITHOTRIPSY (ESWL);  Surgeon: Jerilee Field, MD;  Location: [facility] ORS;  Service: Urology;  Laterality: Left;   EXTRACORPOREAL SHOCK WAVE LITHOTRIPSY Right 04/08/2018   Procedure: EXTRACORPOREAL SHOCK WAVE LITHOTRIPSY (ESWL);  Surgeon: Crist Fat, MD;  Location: [facility];  Service: Urology;  Laterality: Right;   KIDNEY SURGERY     LITHOTRIPSY  05/2016   TUBAL LIGATION     Patient Active Problem List   Diagnosis Date Noted   Left otitis  media 10/26/2021   Bacterial ear infection, bilateral 10/17/2021   Pain of right sacroiliac joint 10/07/2021   Chronic low back pain with left-sided sciatica 03/31/2020   Lumbar disc disease 03/02/2020   Left sided sciatica 01/31/2020   Depression 01/29/2020   Hematuria 01/29/2020   Left-sided weakness 10/31/2019   Balance disorder 10/31/2019   Difficulty with speech 10/02/2019   Insomnia 10/02/2019   HLD (hyperlipidemia) 04/14/2019   Acute otalgia, bilateral 04/14/2019   Vitamin D deficiency 04/14/2019   Anxiety with depression 10/06/2018   Neck pain 09/10/2018   Hand swelling 09/10/2018   Bilateral radiating leg pain 04/23/2018   Myalgia 04/23/2018   Right renal stone 04/03/2018   Smoker 04/29/2016   Bilateral hand pain 04/29/2016   HNP (herniated nucleus pulposus), cervical 04/07/2016   Cervical radiculopathy, acute 01/29/2016   Acute upper respiratory infection 01/29/2016   Degenerative disc disease, cervical 01/11/2016   Hyperglycemia 11/17/2015   Angioedema 10/12/2015   Bilateral lower extremity edema 10/12/2015   Mouth sores 10/12/2015   Hypokalemia 10/12/2015   Thoracic scoliosis 10/01/2015   Peripheral edema 10/01/2015   Eczema 10/01/2015   Asthma 10/01/2015   Breast lump on right side at 4 o'clock position 01/21/2014   Hot flashes 10/01/2013   Lumbar radiculopathy 07/17/2012   Grief  reaction 07/17/2012   Family history of colon cancer 07/02/2012   OAB (overactive bladder) 03/01/2012   Encounter for well adult exam with abnormal findings 08/23/2010   Plantar fasciitis 08/23/2010   Dysuria 01/05/2010   UTI (urinary tract infection) 10/19/2008   ANKLE PAIN, BILATERAL 10/19/2008   VAGINITIS 09/10/2008   JOINT EFFUSION, ANKLE 09/10/2008   SUBACROMIAL BURSITIS, RIGHT 09/01/2008   ACHILLES TENDINITIS 09/01/2008   LATERAL EPICONDYLITIS, RIGHT 08/18/2008   Essential hypertension 07/12/2007   Allergic rhinitis 07/12/2007   GERD 07/12/2007   Chronic low back pain  07/12/2007   NEPHROLITHIASIS, HX OF 07/12/2007    PCP: Corwin Levins, MD   REFERRING PROVIDER: Erick Colace, MD   REFERRING DIAG:  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   PERTINENT HISTORY:  Postlaminectomy syndrome, lumbar region    PAIN:  Are you having pain? Yes: NPRS scale: 7/10 Pain location: SIJ R Pain description: ache; throb, sharpe constant Aggravating factors: walking> 200 ft; ~20 mins standing; bending down Relieving factors: meds; lying on heating pad  PRECAUTIONS: Back  WEIGHT BEARING RESTRICTIONS: No  FALLS:  Has patient fallen in last 6 months? No  LIVING ENVIRONMENT: Lives with: lives with their family Lives in: House/apartment Stairs: Yes: External: 3 steps; on right going up Has following equipment at home: Single point cane  OCCUPATION: retired  PLOF: Independent  PATIENT GOALS: walk around and do my normal thing I used to do.  NEXT MD VISIT: next month  OBJECTIVE:   DIAGNOSTIC FINDINGS:  None in chart As per Dr Wynn Banker: Imaging studies were reviewed demonstrating multilevel lumbar facet arthropathy noted on MRI from 2020   PATIENT SURVEYS:  FOTO Primary score: 30% with goal 43% 14th visit  SCREENING FOR RED FLAGS: none  COGNITION: Overall cognitive status: Within functional limits for tasks assessed     SENSATION: Pt reports radicular pain into ankles ~ twice week after standing too long.  MUSCLE  LENGTH: Hamstrings: Right 70 deg; Left 80 deg   POSTURE: left pelvic obliquity slight  PALPATION: Moderate TTP throughout SI area  LUMBAR ROM:  wfl  LOWER EXTREMITY ROM:  wfl  LOWER EXTREMITY MMT:    MMT Right eval Left eval  Hip flexion 4+ 3+  Hip extension    Hip abduction 5 5  Hip adduction 5 5  Hip internal rotation    Hip external rotation    Knee flexion 5 5  Knee extension 5 5  Ankle dorsiflexion    Ankle plantarflexion    Ankle inversion    Ankle eversion     (Blank rows = not tested)  LUMBAR SPECIAL TESTS:  Straight leg raise test: Negative, Slump test: Negative, Stork standing: Negative, FABER test: positive, and Thomas test: Negative  FUNCTIONAL TESTS:  30 seconds chair stand test: 3 Timed up and go (TUG): 17.36 4 stage balance: WFL  GAIT: Distance walked: 400 ft Assistive device utilized: None Level of assistance: Complete Independence Comments: antalgic, slowed cadence  TODAY'S TREATMENT:                                                                                                                              Pt seen for aquatic therapy today.  Treatment took place in water 3.5-3.75 ft in depth at the [facility] pool. Temp of water was 91.  Pt entered/exited the pool via stairs independently with bilat rail.  *Ue support white barbell: forward and backward amb x 4 widths ea.  Side stepping x 2.  No unsteadiness today  - seated rest period * holding wall:  df; pf; hip abd; hip add; hip ext x 10 reps. *Seated on lift: cycling; add/abd; LAQ and flutter kicking x 20 ea  - seated rest period *walking forward and back in 4 ft Ue support barbel.  Added core strengthening and balance challenge further submerged *Standing ue support on wall: squats; 1/2 diamonds x10 4 ft *Seated on bench: gastroc and hamstring stretch  - STS onto water step ue unsupported x 8. Gains immediate standing balance 8/8/time. Slight unsteadiness first few .  -  SKTC for LB stretch seated.  Pt requires the buoyancy and hydrostatic pressure of water for support, and to offload joints by unweighting joint load by at least 50 % in navel deep water and by at least 75-80% in chest to neck deep water.  Viscosity of the water is needed for resistance of strengthening. Water current perturbations provides challenge to standing balance requiring increased core activation.      PATIENT EDUCATION:  Education details:aquatic therapy intro   Person educated: Patient Education method: Explanation Education comprehension: verbalized understanding  HOME EXERCISE PROGRAM: TBA  ASSESSMENT:  CLINICAL IMPRESSION: Pt progressing well with therapy improving in standing balance as evidenced of less ue support needed with submersion for all activities. She continues to report pain sensitivity in 7-8/range indicating uncontrolled.  She is encouraged to use whatever pain control measures md has approved to try to decrease. Upon completion she reports 0/10. She reports rising from hwer bed as she was instructed scooting towards edge of surface then pushing  up with UE.  She states she has and easier time with less pain completing. Pt not comfortable enough in water to trail pelvic tilting yet. Goals ongoing    FROM EVAL: Patient is a 62 y.o. f who was seen today for physical therapy evaluation and treatment for SIJ disease and lumbosacral spondylosis. She has had chronic LBP for several years with surgical intervention >5 yrs ago. Pain appears to be multifactorial with SIJ and as per MD "facet arthropathy mediated" pain.  She tests with only minor weakness in hips but is highly pain sensitive with movement having significant difficulty with functional mobility (STS and sup<>sit).  She will benefit from skilled physical therapy intervention.  Will plan to begin aquatic based as she will benefit from the properties of water to reduce pain, improve movement and hip strength then  will transition to land based for further progression.    OBJECTIVE IMPAIRMENTS: decreased activity tolerance, decreased mobility, difficulty walking, decreased strength, improper body mechanics, postural dysfunction, and pain.   ACTIVITY LIMITATIONS: carrying, lifting, bending, sitting, standing, squatting, stairs, transfers, and bed mobility  PARTICIPATION LIMITATIONS: meal prep, cleaning, shopping, community activity, and yard work  PERSONAL FACTORS: Fitness and Time since onset of injury/illness/exacerbation are also affecting patient's functional outcome.   REHAB POTENTIAL: Good  CLINICAL DECISION MAKING: Evolving/moderate complexity  EVALUATION COMPLEXITY: Moderate   GOALS: Goals reviewed with patient? Yes  SHORT TERM GOALS: Target date: 07/16/22  Pt will tolerate full aquatic sessions consistently without increase in pain and with improving function to demonstrate good toleration and effectiveness of intervention.   Baseline: Goal status: INITIAL  2.  Pt will tolerate STS submerged from bench onto blue step x10 consecutive reps without pain Baseline: unable on land Goal status: INITIAL  3.  Pt will report decrease in overall  pain to </= 6/10 for improved toleration to activity/quality of life and to demonstrate improved management of pain.  Baseline: 9-10/10 Goal status: INITIAL  4.  Pt will tolerate stair climbing using alternating pattern ascending and descending 6 steps without use of handrail in aquatic setting Baseline: step to and painful Goal status: INITIAL   LONG TERM GOALS: Target date: 07/17/22  Pt to meet stated Foto Goal of 43% Baseline: 30% Goal status: INITIAL  2.  Pt will be indep with final HEP's (land and aquatic as appropriate) for continued management of condition  Baseline: none Goal status: INITIAL  3.  Pt will improve on 30s STS test to >or=  7  to demonstrate improving functional lower extremity strength, transitional movements, and  balance  Baseline: 3 Goal status: INITIAL  4.  Pt will improve on Tug test to <or= 10s to demonstrate improvement in lower extremity function, mobility and decreased fall risk.  Baseline: 17.36 Goal status: INITIAL  5.  Pt will report decrease in worst pain to </= 4/10 for improved toleration to activity/quality of life and to demonstrate improved management of pain.  Baseline: 9-10/10 Goal status: INITIAL  6.  Pt will report improved toleration to activity on daily basis as prior to the onset of skilled physical therapy intervention. Baseline: minimal toleration to any activity Goal status: INITIAL  PLAN:  PT FREQUENCY: 1-2x/week  PT DURATION: 8 weeks  12 visit (allowing for scheduling conflicts)  PLANNED INTERVENTIONS: Therapeutic exercises, Therapeutic activity, Neuromuscular re-education, Balance training, Gait training, Patient/Family education, Self Care, Joint mobilization, Stair training, DME instructions, Aquatic Therapy, Dry Needling, Taping, Ionotophoresis 4mg /ml Dexamethasone, Manual therapy, and Re-evaluation.  PLAN FOR NEXT SESSION:  aquatic LE and lumbosacral mobility and strength; stretching, stair and transfer training, pain relief Land: body mechanics; sup<>sit/STS transfer, stair climbing; lumbosacral mobilization/strengthening; stretching program   Rushie Chestnut) Shareef Eddinger MPT 06/14/22 1:01 PM [facility] [address] [place], [place], [address] Phone: [phone]   Fax:  [phone]

## 2022-06-16 ENCOUNTER — Ambulatory Visit (HOSPITAL_BASED_OUTPATIENT_CLINIC_OR_DEPARTMENT_OTHER): Payer: Medicare HMO | Admitting: Physical Therapy

## 2022-06-16 ENCOUNTER — Encounter (HOSPITAL_BASED_OUTPATIENT_CLINIC_OR_DEPARTMENT_OTHER): Payer: Self-pay | Admitting: Physical Therapy

## 2022-06-16 DIAGNOSIS — M6281 Muscle weakness (generalized): Secondary | ICD-10-CM

## 2022-06-16 DIAGNOSIS — M533 Sacrococcygeal disorders, not elsewhere classified: Secondary | ICD-10-CM

## 2022-06-16 DIAGNOSIS — M47817 Spondylosis without myelopathy or radiculopathy, lumbosacral region: Secondary | ICD-10-CM | POA: Diagnosis not present

## 2022-06-16 NOTE — Therapy (Signed)
OUTPATIENT PHYSICAL THERAPY THORACOLUMBAR TREATMENT   Patient Name: [name] Walker MRN: [id number] DOB:[birth year], 62 y.o., female Today's Date: 06/16/2022  END OF SESSION:  PT End of Session - 06/16/22 1029     Visit Number 5    Number of Visits 12    Date for PT Re-Evaluation 07/17/22    Authorization Type [employer] MCR    PT Start Time 4098    PT Stop Time 1035    PT Time Calculation (min) 45 min    Activity Tolerance Patient tolerated treatment well    Behavior During Therapy [facility] for tasks assessed/performed              Past Medical History:  Diagnosis Date   ACHILLES TENDINITIS    ALLERGIC RHINITIS    Allergy    Anginal pain 04/04/2016   Pt currently feels like she is having muscle spasms and aching in her chest   Anxiety    Asthma    Chronic kidney disease    Depression    GERD    History of kidney stones    HYPERTENSION    LATERAL EPICONDYLITIS, RIGHT    LOW BACK PAIN    Plantar fascial fibromatosis    Stress    SUBACROMIAL BURSITIS, RIGHT    Thoracic scoliosis 10/01/2015   TIA (transient ischemic attack)    Past Surgical History:  Procedure Laterality Date   ANTERIOR CERVICAL DECOMP/DISCECTOMY FUSION N/A 04/07/2016   Procedure: Cervical two-three Anterior cervical decompression/discectomy/fusion;  Surgeon: Coletta Memos, MD;  Location: [facility] OR;  Service: Neurosurgery;  Laterality: N/A;   BACK SURGERY  2015   lower back   EXTRACORPOREAL SHOCK WAVE LITHOTRIPSY Left 09/28/2016   Procedure: LEFT EXTRACORPOREAL SHOCK WAVE LITHOTRIPSY (ESWL);  Surgeon: Jerilee Field, MD;  Location: [facility] ORS;  Service: Urology;  Laterality: Left;   EXTRACORPOREAL SHOCK WAVE LITHOTRIPSY Right 04/08/2018   Procedure: EXTRACORPOREAL SHOCK WAVE LITHOTRIPSY (ESWL);  Surgeon: Crist Fat, MD;  Location: [facility] ORS;  Service: Urology;  Laterality: Right;   KIDNEY SURGERY     LITHOTRIPSY  05/2016   TUBAL LIGATION     Patient Active Problem List   Diagnosis Date Noted   Left otitis  media 10/26/2021   Bacterial ear infection, bilateral 10/17/2021   Pain of right sacroiliac joint 10/07/2021   Chronic low back pain with left-sided sciatica 03/31/2020   Lumbar disc disease 03/02/2020   Left sided sciatica 01/31/2020   Depression 01/29/2020   Hematuria 01/29/2020   Left-sided weakness 10/31/2019   Balance disorder 10/31/2019   Difficulty with speech 10/02/2019   Insomnia 10/02/2019   HLD (hyperlipidemia) 04/14/2019   Acute otalgia, bilateral 04/14/2019   Vitamin D deficiency 04/14/2019   Anxiety with depression 10/06/2018   Neck pain 09/10/2018   Hand swelling 09/10/2018   Bilateral radiating leg pain 04/23/2018   Myalgia 04/23/2018   Right renal stone 04/03/2018   Smoker 04/29/2016   Bilateral hand pain 04/29/2016   HNP (herniated nucleus pulposus), cervical 04/07/2016   Cervical radiculopathy, acute 01/29/2016   Acute upper respiratory infection 01/29/2016   Degenerative disc disease, cervical 01/11/2016   Hyperglycemia 11/17/2015   Angioedema 10/12/2015   Bilateral lower extremity edema 10/12/2015   Mouth sores 10/12/2015   Hypokalemia 10/12/2015   Thoracic scoliosis 10/01/2015   Peripheral edema 10/01/2015   Eczema 10/01/2015   Asthma 10/01/2015   Breast lump on right side at 4 o'clock position 01/21/2014   Hot flashes 10/01/2013   Lumbar radiculopathy 07/17/2012   Grief  reaction 07/17/2012   Family history of colon cancer 07/02/2012   OAB (overactive bladder) 03/01/2012   Encounter for well adult exam with abnormal findings 08/23/2010   Plantar fasciitis 08/23/2010   Dysuria 01/05/2010   UTI (urinary tract infection) 10/19/2008   ANKLE PAIN, BILATERAL 10/19/2008   VAGINITIS 09/10/2008   JOINT EFFUSION, ANKLE 09/10/2008   SUBACROMIAL BURSITIS, RIGHT 09/01/2008   ACHILLES TENDINITIS 09/01/2008   LATERAL EPICONDYLITIS, RIGHT 08/18/2008   Essential hypertension 07/12/2007   Allergic rhinitis 07/12/2007   GERD 07/12/2007   Chronic low back pain  07/12/2007   NEPHROLITHIASIS, HX OF 07/12/2007    PCP: Corwin Levins, MD   REFERRING PROVIDER: Erick Colace, MD   REFERRING DIAG:  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  She does report high pain sensitivity today in left hip. "It seems to move from side to side"  PERTINENT HISTORY:  Postlaminectomy syndrome, lumbar region    PAIN:  Are you having pain? Yes: NPRS scale: 7/10 Pain location: SIJ R Pain description: ache; throb, sharpe constant Aggravating factors: walking> 200 ft; ~20 mins standing; bending down Relieving factors: meds; lying on heating pad  PRECAUTIONS: Back  WEIGHT BEARING RESTRICTIONS: No  FALLS:  Has patient fallen in last 6 months? No  LIVING ENVIRONMENT: Lives with: lives with their family Lives in: House/apartment Stairs: Yes: External: 3 steps; on right going up Has following equipment at home: Single point cane  OCCUPATION: retired  PLOF: Independent  PATIENT GOALS: walk around and do my normal thing I used to do.  NEXT MD VISIT: next month  OBJECTIVE:   DIAGNOSTIC FINDINGS:  None in chart As per Dr Wynn Banker: Imaging studies were reviewed demonstrating multilevel lumbar facet arthropathy noted on MRI from 2020   PATIENT SURVEYS:  FOTO Primary score: 30% with goal 43% 14th visit  SCREENING FOR RED FLAGS: none  COGNITION: Overall cognitive status: Within functional limits for tasks  assessed     SENSATION: Pt reports radicular pain into ankles ~ twice week after standing too long.  MUSCLE LENGTH: Hamstrings: Right 70 deg; Left 80 deg  POSTURE: left pelvic obliquity slight  PALPATION: Moderate TTP throughout SI area  LUMBAR ROM:  wfl  LOWER EXTREMITY ROM:     wfl  LOWER EXTREMITY MMT:    MMT Right eval Left eval  Hip flexion 4+ 3+  Hip extension    Hip abduction 5 5  Hip adduction 5 5  Hip internal rotation    Hip external rotation    Knee flexion 5 5  Knee extension 5 5  Ankle dorsiflexion    Ankle plantarflexion    Ankle inversion    Ankle eversion     (Blank rows = not tested)  LUMBAR SPECIAL TESTS:  Straight leg raise test: Negative, Slump test: Negative, Stork standing: Negative, FABER test: positive, and Thomas test: Negative  FUNCTIONAL TESTS:  30 seconds chair stand test: 3 Timed up and go (TUG): 17.36 4 stage balance: WFL  GAIT: Distance walked: 400 ft Assistive device utilized: None Level of assistance: Complete Independence Comments: antalgic, slowed cadence  TODAY'S TREATMENT:                                                                                                                              Pt seen for aquatic therapy today.  Treatment took place in water 3.5-3.75 ft in depth at the [facility] pool. Temp of water was 91.  Pt entered/exited the pool via stairs independently with bilat rail.  *Ue support white barbell: forward and backward amb x 4 widths ea.  Side stepping x 4.    *Core engagement submersion rainbow HB at sides walking forward, back and side step ea x 2 widths * SKTC for LB stretch standing leaning against wall. *Seated on lift: cycling; add/abd; LAQ and flutter kicking x 20 ea  - seated rest period *Sit to Stand with Arm Reach Toward [employer]. Using barbel x10  - seated rest period *Standing ue support on wall: squats; 1/2 diamonds x10 4 ft  * holding wall:  df; pf; hip abd; hip add;  hip ext x 10 reps.   *Seated on bench: gastroc and hamstring stretch  - STS onto water step ue unsupported x 8. Gains immediate standing balance 8/8/time. Slight unsteadiness first few .    Pt requires the buoyancy and hydrostatic pressure of water for support, and to offload joints by unweighting joint load by at least 50 % in navel deep water and by at least 75-80% in chest to neck deep water.  Viscosity of the water is needed for resistance of strengthening. Water current perturbations provides challenge to standing balance requiring increased core activation.      PATIENT EDUCATION:  Education details:aquatic therapy intro   Person educated: Patient Education method: Explanation Education comprehension: verbalized understanding  HOME EXERCISE PROGRAM: TBA  ASSESSMENT:  CLINICAL IMPRESSION: Pt presents with high pain today.  She feels like it switches sides.  She continues to need rest periods between exercises but 1-2 fewer today. She is completing 10  reps of STS submerged seated on bench with feet on water step.  Added reaching component for SIJ stretching. She does report slight improvement in pain sensitivity with improved ability to walk (through stores).  Goals ongoing      FROM EVAL: Patient is a 62 y.o. f who was seen today for physical therapy evaluation and treatment for SIJ disease and lumbosacral spondylosis. She has had chronic LBP for several years with surgical intervention >5 yrs ago. Pain appears to be multifactorial with SIJ and as per MD "facet arthropathy mediated" pain.  She tests with only minor weakness in hips but is highly pain sensitive with movement having significant difficulty with functional mobility (STS and sup<>sit).  She will benefit from skilled physical therapy intervention.  Will plan to begin aquatic based as she will benefit from the properties of water to reduce pain, improve movement and hip strength then will transition to land based for  further progression.    OBJECTIVE IMPAIRMENTS: decreased activity tolerance, decreased mobility, difficulty walking, decreased strength, improper body mechanics, postural dysfunction, and pain.   ACTIVITY LIMITATIONS: carrying, lifting, bending, sitting, standing, squatting, stairs, transfers, and bed mobility  PARTICIPATION LIMITATIONS: meal prep, cleaning, shopping, community activity, and yard work  PERSONAL FACTORS: Fitness and Time since onset of injury/illness/exacerbation are also affecting patient's functional outcome.   REHAB POTENTIAL: Good  CLINICAL DECISION MAKING: Evolving/moderate complexity  EVALUATION COMPLEXITY: Moderate   GOALS: Goals reviewed with patient? Yes  SHORT TERM GOALS: Target date: 07/16/22  Pt will tolerate full aquatic sessions consistently without increase in pain and with improving function to demonstrate good toleration and effectiveness of intervention.   Baseline: Goal status: INITIAL  2.  Pt will tolerate STS submerged from bench onto blue step x10 consecutive reps without pain Baseline: unable on land Goal status: INITIAL  3.  Pt will report decrease in overall  pain to </= 6/10 for improved toleration to activity/quality of life and to demonstrate improved management of pain.  Baseline: 9-10/10 Goal status: INITIAL  4.  Pt will tolerate stair climbing using alternating pattern ascending and descending 6 steps without use of handrail in aquatic setting Baseline: step to and painful Goal status: INITIAL   LONG TERM GOALS: Target date: 07/17/22  Pt to meet stated Foto Goal of 43% Baseline: 30% Goal status: INITIAL  2.  Pt will be indep with final HEP's (land and aquatic as appropriate) for continued management of condition  Baseline: none Goal status: INITIAL  3.  Pt will improve on 30s STS test to >or=  7  to demonstrate improving functional lower extremity strength, transitional movements, and balance  Baseline: 3 Goal status:  INITIAL  4.  Pt will improve on Tug test to <or= 10s to demonstrate improvement in lower extremity function, mobility and decreased fall risk.  Baseline: 17.36 Goal status: INITIAL  5.  Pt will report decrease in worst pain to </= 4/10 for improved toleration to activity/quality of life and to demonstrate improved management of pain.  Baseline: 9-10/10 Goal status: INITIAL  6.  Pt will report improved toleration to activity on daily basis as prior to the onset of skilled physical therapy intervention. Baseline: minimal toleration to any activity Goal status: INITIAL  PLAN:  PT FREQUENCY: 1-2x/week  PT DURATION: 8 weeks  12 visit (allowing for scheduling conflicts)  PLANNED INTERVENTIONS: Therapeutic exercises, Therapeutic activity, Neuromuscular re-education, Balance training, Gait training, Patient/Family education, Self Care, Joint mobilization, Stair training, DME instructions, Aquatic Therapy, Dry Needling, Taping, Ionotophoresis  4mg /ml Dexamethasone, Manual therapy, and Re-evaluation.  PLAN FOR NEXT SESSION: aquatic LE and lumbosacral mobility and strength; stretching, stair and transfer training, pain relief Land: body mechanics; sup<>sit/STS transfer, stair climbing; lumbosacral mobilization/strengthening; stretching program   Rushie Chestnut) Marquell Saenz MPT 06/16/22 10:30 AM [facility] [place] [facility] [address] [place], [place], [address] Phone: [phone]   Fax:  [phone]

## 2022-06-20 ENCOUNTER — Encounter: Payer: Medicare HMO | Admitting: Physical Medicine & Rehabilitation

## 2022-06-21 ENCOUNTER — Ambulatory Visit (HOSPITAL_BASED_OUTPATIENT_CLINIC_OR_DEPARTMENT_OTHER): Payer: Medicare HMO | Admitting: Physical Therapy

## 2022-06-23 ENCOUNTER — Ambulatory Visit (HOSPITAL_BASED_OUTPATIENT_CLINIC_OR_DEPARTMENT_OTHER): Payer: Medicare HMO | Admitting: Physical Therapy

## 2022-06-23 DIAGNOSIS — M6281 Muscle weakness (generalized): Secondary | ICD-10-CM | POA: Diagnosis not present

## 2022-06-23 DIAGNOSIS — M47817 Spondylosis without myelopathy or radiculopathy, lumbosacral region: Secondary | ICD-10-CM

## 2022-06-23 DIAGNOSIS — M533 Sacrococcygeal disorders, not elsewhere classified: Secondary | ICD-10-CM | POA: Diagnosis not present

## 2022-06-23 NOTE — Therapy (Signed)
OUTPATIENT PHYSICAL THERAPY THORACOLUMBAR TREATMENT   Patient Name: [name] Walker MRN: [id number] DOB:01/08/1961, 62 y.o., female Today's Date: 06/23/2022  END OF SESSION:  PT End of Session - 06/23/22 1041     Visit Number 6    Number of Visits 12    Date for PT Re-Evaluation 07/17/22    Authorization Type [employer] MCR    PT Start Time 1030    PT Stop Time 1110    PT Time Calculation (min) 40 min    Activity Tolerance Patient tolerated treatment well    Behavior During Therapy WFL for tasks assessed/performed              Past Medical History:  Diagnosis Date   ACHILLES TENDINITIS    ALLERGIC RHINITIS    Allergy    Anginal pain (HCC) 04/04/2016   Pt currently feels like she is having muscle spasms and aching in her chest   Anxiety    Asthma    Chronic kidney disease    Depression    GERD    History of kidney stones    HYPERTENSION    LATERAL EPICONDYLITIS, RIGHT    LOW BACK PAIN    Plantar fascial fibromatosis    Stress    SUBACROMIAL BURSITIS, RIGHT    Thoracic scoliosis 10/01/2015   TIA (transient ischemic attack)    Past Surgical History:  Procedure Laterality Date   ANTERIOR CERVICAL DECOMP/DISCECTOMY FUSION N/A 04/07/2016   Procedure: Cervical two-three Anterior cervical decompression/discectomy/fusion;  Surgeon: Coletta Memos, MD;  Location: [facility];  Service: Neurosurgery;  Laterality: N/A;   BACK SURGERY  2015   lower back   EXTRACORPOREAL SHOCK WAVE LITHOTRIPSY Left 09/28/2016   Procedure: LEFT EXTRACORPOREAL SHOCK WAVE LITHOTRIPSY (ESWL);  Surgeon: Jerilee Field, MD;  Location: [facility] ORS;  Service: Urology;  Laterality: Left;   EXTRACORPOREAL SHOCK WAVE LITHOTRIPSY Right 04/08/2018   Procedure: EXTRACORPOREAL SHOCK WAVE LITHOTRIPSY (ESWL);  Surgeon: Crist Fat, MD;  Location: [facility] ORS;  Service: Urology;  Laterality: Right;   KIDNEY SURGERY     LITHOTRIPSY  05/2016   TUBAL LIGATION     Patient Active Problem List   Diagnosis Date Noted   Left  otitis media 10/26/2021   Bacterial ear infection, bilateral 10/17/2021   Pain of right sacroiliac joint 10/07/2021   Chronic low back pain with left-sided sciatica 03/31/2020   Lumbar disc disease 03/02/2020   Left sided sciatica 01/31/2020   Depression 01/29/2020   Hematuria 01/29/2020   Left-sided weakness 10/31/2019   Balance disorder 10/31/2019   Difficulty with speech 10/02/2019   Insomnia 10/02/2019   HLD (hyperlipidemia) 04/14/2019   Acute otalgia, bilateral 04/14/2019   Vitamin D deficiency 04/14/2019   Anxiety with depression 10/06/2018   Neck pain 09/10/2018   Hand swelling 09/10/2018   Bilateral radiating leg pain 04/23/2018   Myalgia 04/23/2018   Right renal stone 04/03/2018   Smoker 04/29/2016   Bilateral hand pain 04/29/2016   HNP (herniated nucleus pulposus), cervical 04/07/2016   Cervical radiculopathy, acute 01/29/2016   Acute upper respiratory infection 01/29/2016   Degenerative disc disease, cervical 01/11/2016   Hyperglycemia 11/17/2015   Angioedema 10/12/2015   Bilateral lower extremity edema 10/12/2015   Mouth sores 10/12/2015   Hypokalemia 10/12/2015   Thoracic scoliosis 10/01/2015   Peripheral edema 10/01/2015   Eczema 10/01/2015   Asthma 10/01/2015   Breast lump on right side at 4 o'clock position 01/21/2014   Hot flashes 10/01/2013   Lumbar radiculopathy 07/17/2012  Grief reaction 07/17/2012   Family history of colon cancer 07/02/2012   OAB (overactive bladder) 03/01/2012   Encounter for well adult exam with abnormal findings 08/23/2010   Plantar fasciitis 08/23/2010   Dysuria 01/05/2010   UTI (urinary tract infection) 10/19/2008   ANKLE PAIN, BILATERAL 10/19/2008   VAGINITIS 09/10/2008   JOINT EFFUSION, ANKLE 09/10/2008   SUBACROMIAL BURSITIS, RIGHT 09/01/2008   ACHILLES TENDINITIS 09/01/2008   LATERAL EPICONDYLITIS, RIGHT 08/18/2008   Essential hypertension 07/12/2007   Allergic rhinitis 07/12/2007   GERD 07/12/2007   Chronic low  back pain 07/12/2007   NEPHROLITHIASIS, HX OF 07/12/2007    PCP: Corwin Levins, MD   REFERRING PROVIDER: Erick Colace, MD   REFERRING DIAG:  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  She states she has been doing exercises (squats, hip abdct, high knee marching and trunk ext) at counter 1x/ day.    PERTINENT HISTORY:  Postlaminectomy syndrome, lumbar region    PAIN:  Are you having pain? Yes: NPRS scale: 5/10 Pain location: centralized back, Rt shoulder  Pain description: ache; throb, sharpe constant Aggravating factors: walking> 200 ft; ~20 mins standing; bending down Relieving factors: meds; lying on heating pad  PRECAUTIONS: Back  WEIGHT BEARING RESTRICTIONS: No  FALLS:  Has patient fallen in last 6 months? No  LIVING ENVIRONMENT: Lives with: lives with their family Lives in: House/apartment Stairs: Yes: External: 3 steps; on right going up Has following equipment at home: Single point cane  OCCUPATION: retired  PLOF: Independent  PATIENT GOALS: walk around and do my normal thing I used to do.  NEXT MD VISIT: next month  OBJECTIVE:   DIAGNOSTIC FINDINGS:  None in chart As per Dr Wynn Banker: Imaging studies were reviewed demonstrating multilevel lumbar facet arthropathy noted on MRI from 2020   PATIENT SURVEYS:  FOTO Primary score: 30% with goal 43% 14th visit  SCREENING FOR RED FLAGS: none  COGNITION: Overall  cognitive status: Within functional limits for tasks assessed     SENSATION: Pt reports radicular pain into ankles ~ twice week after standing too long.  MUSCLE  LENGTH: Hamstrings: Right 70 deg; Left 80 deg   POSTURE: left pelvic obliquity slight  PALPATION: Moderate TTP throughout SI area  LUMBAR ROM:  wfl  LOWER EXTREMITY ROM:     wfl  LOWER EXTREMITY MMT:    MMT Right eval Left eval  Hip flexion 4+ 3+  Hip extension    Hip abduction 5 5  Hip adduction 5 5  Hip internal rotation    Hip external rotation    Knee flexion 5 5  Knee extension 5 5  Ankle dorsiflexion    Ankle plantarflexion    Ankle inversion    Ankle eversion     (Blank rows = not tested)  LUMBAR SPECIAL TESTS:  Straight leg raise test: Negative, Slump test: Negative, Stork standing: Negative, FABER test: positive, and Thomas test: Negative  FUNCTIONAL TESTS:  30 seconds chair stand test: 3 Timed up and go (TUG): 17.36 4 stage balance: WFL  GAIT: Distance walked: 400 ft Assistive device utilized: None Level of assistance: Complete Independence Comments: antalgic, slowed cadence  TODAY'S TREATMENT:                                                                                                                              Pt seen for aquatic therapy today.  Treatment took place in water 3.5-3.75 ft in depth at the [facility] pool. Temp of water was 91.  Pt entered/exited the pool via stairs independently with bilat rail.  *UE support white barbell: forward and backward amb x 4 laps.  Side stepping x 2 laps  * side stepping with rainbow hand floats and arm addct x 1 lap * seated on bench in water with feet on blue step:  STS with hands on rainbow hand floats x 10 - cues for neutral head and spine, core engaged;   cycling; alterating LAQ with DF; flutter kick; long sitting hip abdct/ addct   * holding wall:  heel/toe raises x 10;  hip abd/ hip add x 10 with cues for neutral foot; mini  squats x 10 * return to walking holding barball- forward/backward 1 lap.  Pt requires the buoyancy and hydrostatic pressure of water for support, and to offload joints by unweighting joint load by at least 50 % in navel deep water and by at least 75-80% in chest to neck deep water.  Viscosity of the water is needed for resistance of strengthening. Water current perturbations provides challenge to standing balance requiring increased core activation.      PATIENT EDUCATION:  Education details:aquatic therapy progressions   Person educated: Patient Education method: Explanation Education comprehension: verbalized understanding  HOME EXERCISE PROGRAM: TBA Verbally discussed for land- mini squats, hip abdct/ addct, trunk ext, and marching at counter  ASSESSMENT:  CLINICAL IMPRESSION: Pt able to progress exercises today requiring increased core engagement and balance without increase in pain.  Pt reported elimination of back pain while in water.  Pt did not require any  rest periods today.   Pt has been doing some of the exercises on land; discussed modifications to those as to not irritate her lower back and she verbalized understanding.  Good tolerance for all exercises today.  Encouraged self care in PM since she did more difficult exercises today. Pt has partially met her STG.    FROM EVAL: Patient is a 62 y.o. f who was seen today for physical therapy evaluation and treatment for SIJ disease and lumbosacral spondylosis. She has had chronic LBP for several years with surgical intervention >5 yrs ago. Pain appears to be multifactorial with SIJ and as per MD "facet arthropathy mediated" pain.  She tests with only minor weakness in hips but is highly pain sensitive with movement having significant difficulty with functional mobility (STS and sup<>sit).  She will benefit from skilled physical therapy intervention.  Will plan to begin aquatic based as she will benefit from the properties of water  to reduce pain, improve movement and hip strength then will transition to land based for further progression.    OBJECTIVE IMPAIRMENTS: decreased activity tolerance, decreased mobility, difficulty walking, decreased strength, improper body mechanics, postural dysfunction, and pain.   ACTIVITY LIMITATIONS: carrying, lifting, bending, sitting, standing, squatting, stairs, transfers, and bed mobility  PARTICIPATION LIMITATIONS: meal prep, cleaning, shopping, community activity, and yard work  PERSONAL FACTORS: Fitness and Time since onset of injury/illness/exacerbation are also affecting patient's functional outcome.   REHAB POTENTIAL: Good  CLINICAL DECISION MAKING: Evolving/moderate complexity  EVALUATION COMPLEXITY: Moderate   GOALS: Goals reviewed with patient? Yes  SHORT TERM GOALS: Target date:   Pt will tolerate full aquatic sessions consistently without increase in pain and with improving function to demonstrate good toleration and effectiveness of intervention.   Baseline: Goal status: MET - 06/23/22  2.  Pt will tolerate STS submerged from bench onto blue step x10 consecutive reps without pain Baseline: unable on land Goal status: MET - 06/23/22  3.  Pt will report decrease in overall  pain to </= 6/10 for improved toleration to activity/quality of life and to demonstrate improved management of pain.  Baseline: 9-10/10 Goal status:ONGOING   4.  Pt will tolerate stair climbing using alternating pattern ascending and descending 6 steps without use of handrail in aquatic setting Baseline: step to and painful Goal status: PARTIALLY MET - 06/23/22   LONG TERM GOALS: Target date: 07/17/22  Pt to meet stated Foto Goal of 43% Baseline: 30% Goal status: INITIAL  2.  Pt will be indep with final HEP's (land and aquatic as appropriate) for continued management of condition  Baseline: none Goal status: INITIAL  3.  Pt will improve on 30s STS test to >or=  7  to demonstrate  improving functional lower extremity strength, transitional movements, and balance  Baseline: 3 Goal status: INITIAL  4.  Pt will improve on Tug test to <or= 10s to demonstrate improvement in lower extremity function, mobility and decreased fall risk.  Baseline: 17.36 Goal status: INITIAL  5.  Pt will report decrease in worst pain to </= 4/10 for improved toleration to activity/quality of life and to demonstrate improved management of pain.  Baseline: 9-10/10 Goal status: INITIAL  6.  Pt will report improved toleration to activity on daily basis as prior to the onset of skilled physical therapy intervention. Baseline: minimal toleration to any activity Goal status: INITIAL  PLAN:  PT FREQUENCY: 1-2x/week  PT DURATION: 8 weeks  12 visit (allowing for scheduling conflicts)  PLANNED INTERVENTIONS: Therapeutic exercises,  Therapeutic activity, Neuromuscular re-education, Balance training, Gait training, Patient/Family education, Self Care, Joint mobilization, Stair training, DME instructions, Aquatic Therapy, Dry Needling, Taping, Ionotophoresis 4mg /ml Dexamethasone, Manual therapy, and Re-evaluation.  PLAN FOR NEXT SESSION: aquatic LE and lumbosacral mobility and strength; stretching, stair and transfer training, pain relief Land: body mechanics; sup<>sit/STS transfer, stair climbing; lumbosacral mobilization/strengthening; stretching program  Mayer Camel, PTA 06/23/22 11:11 AM [facility] [address] [place], [place], [address] Phone: [phone]   Fax:  [phone]

## 2022-06-28 ENCOUNTER — Encounter (HOSPITAL_BASED_OUTPATIENT_CLINIC_OR_DEPARTMENT_OTHER): Payer: Self-pay | Admitting: Physical Therapy

## 2022-06-28 ENCOUNTER — Ambulatory Visit (HOSPITAL_BASED_OUTPATIENT_CLINIC_OR_DEPARTMENT_OTHER): Payer: Medicare HMO | Attending: Physical Medicine & Rehabilitation | Admitting: Physical Therapy

## 2022-06-28 DIAGNOSIS — M6281 Muscle weakness (generalized): Secondary | ICD-10-CM | POA: Diagnosis not present

## 2022-06-28 DIAGNOSIS — M47817 Spondylosis without myelopathy or radiculopathy, lumbosacral region: Secondary | ICD-10-CM | POA: Insufficient documentation

## 2022-06-28 DIAGNOSIS — M533 Sacrococcygeal disorders, not elsewhere classified: Secondary | ICD-10-CM | POA: Diagnosis not present

## 2022-06-28 NOTE — Therapy (Signed)
OUTPATIENT PHYSICAL THERAPY THORACOLUMBAR TREATMENT   Patient Name: [name] Walker MRN: [id number] DOB:October 11, 1960, 62 y.o., female Today's Date: 06/28/2022  END OF SESSION:  PT End of Session - 06/28/22 1040     Visit Number 7    Number of Visits 12    Date for PT Re-Evaluation 07/17/22    Authorization Type [employer] MCR    PT Start Time 1030    PT Stop Time 1110    PT Time Calculation (min) 40 min    Activity Tolerance Patient tolerated treatment well    Behavior During Therapy WFL for tasks assessed/performed              Past Medical History:  Diagnosis Date   ACHILLES TENDINITIS    ALLERGIC RHINITIS    Allergy    Anginal pain (HCC) 04/04/2016   Pt currently feels like she is having muscle spasms and aching in her chest   Anxiety    Asthma    Chronic kidney disease    Depression    GERD    History of kidney stones    HYPERTENSION    LATERAL EPICONDYLITIS, RIGHT    LOW BACK PAIN    Plantar fascial fibromatosis    Stress    SUBACROMIAL BURSITIS, RIGHT    Thoracic scoliosis 10/01/2015   TIA (transient ischemic attack)    Past Surgical History:  Procedure Laterality Date   ANTERIOR CERVICAL DECOMP/DISCECTOMY FUSION N/A 04/07/2016   Procedure: Cervical two-three Anterior cervical decompression/discectomy/fusion;  Surgeon: Coletta Memos, MD;  Location: [facility] OR;  Service: Neurosurgery;  Laterality: N/A;   BACK SURGERY  2015   lower back   EXTRACORPOREAL SHOCK WAVE LITHOTRIPSY Left 09/28/2016   Procedure: LEFT EXTRACORPOREAL SHOCK WAVE LITHOTRIPSY (ESWL);  Surgeon: Jerilee Field, MD;  Location: [facility] ORS;  Service: Urology;  Laterality: Left;   EXTRACORPOREAL SHOCK WAVE LITHOTRIPSY Right 04/08/2018   Procedure: EXTRACORPOREAL SHOCK WAVE LITHOTRIPSY (ESWL);  Surgeon: Crist Fat, MD;  Location: [facility] ORS;  Service: Urology;  Laterality: Right;   KIDNEY SURGERY     LITHOTRIPSY  05/2016   TUBAL LIGATION     Patient Active Problem List   Diagnosis Date Noted   Left  otitis media 10/26/2021   Bacterial ear infection, bilateral 10/17/2021   Pain of right sacroiliac joint 10/07/2021   Chronic low back pain with left-sided sciatica 03/31/2020   Lumbar disc disease 03/02/2020   Left sided sciatica 01/31/2020   Depression 01/29/2020   Hematuria 01/29/2020   Left-sided weakness 10/31/2019   Balance disorder 10/31/2019   Difficulty with speech 10/02/2019   Insomnia 10/02/2019   HLD (hyperlipidemia) 04/14/2019   Acute otalgia, bilateral 04/14/2019   Vitamin D deficiency 04/14/2019   Anxiety with depression 10/06/2018   Neck pain 09/10/2018   Hand swelling 09/10/2018   Bilateral radiating leg pain 04/23/2018   Myalgia 04/23/2018   Right renal stone 04/03/2018   Smoker 04/29/2016   Bilateral hand pain 04/29/2016   HNP (herniated nucleus pulposus), cervical 04/07/2016   Cervical radiculopathy, acute 01/29/2016   Acute upper respiratory infection 01/29/2016   Degenerative disc disease, cervical 01/11/2016   Hyperglycemia 11/17/2015   Angioedema 10/12/2015   Bilateral lower extremity edema 10/12/2015   Mouth sores 10/12/2015   Hypokalemia 10/12/2015   Thoracic scoliosis 10/01/2015   Peripheral edema 10/01/2015   Eczema 10/01/2015   Asthma 10/01/2015   Breast lump on right side at 4 o'clock position 01/21/2014   Hot flashes 10/01/2013   Lumbar radiculopathy 07/17/2012  Grief reaction 07/17/2012   Family history of colon cancer 07/02/2012   OAB (overactive bladder) 03/01/2012   Encounter for well adult exam with abnormal findings 08/23/2010   Plantar fasciitis 08/23/2010   Dysuria 01/05/2010   UTI (urinary tract infection) 10/19/2008   ANKLE PAIN, BILATERAL 10/19/2008   VAGINITIS 09/10/2008   JOINT EFFUSION, ANKLE 09/10/2008   SUBACROMIAL BURSITIS, RIGHT 09/01/2008   ACHILLES TENDINITIS 09/01/2008   LATERAL EPICONDYLITIS, RIGHT 08/18/2008   Essential hypertension 07/12/2007   Allergic rhinitis 07/12/2007   GERD 07/12/2007   Chronic low  back pain 07/12/2007   NEPHROLITHIASIS, HX OF 07/12/2007    PCP: Corwin Levins, MD   REFERRING PROVIDER: Erick Colace, MD   REFERRING DIAG:  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  Her Rt hip/buttock is sore today.     PERTINENT HISTORY:  Postlaminectomy syndrome, lumbar region    PAIN:  Are you having pain? Yes: NPRS scale: 5/10 Pain location: centralized back, Rt hip  Pain description: ache; throb, sharpe constant Aggravating factors: walking> 200 ft; ~20 mins standing; bending down Relieving factors: meds; lying on heating pad  PRECAUTIONS: Back  WEIGHT BEARING RESTRICTIONS: No  FALLS:  Has patient fallen in last 6 months? No  LIVING ENVIRONMENT: Lives with: lives with their family Lives in: House/apartment Stairs: Yes: External: 3 steps; on right going up Has following equipment at home: Single point cane  OCCUPATION: retired  PLOF: Independent  PATIENT GOALS: walk around and do my normal thing I used to do.  NEXT MD VISIT: next month  OBJECTIVE:   DIAGNOSTIC FINDINGS:  None in chart As per Dr Wynn Banker: Imaging studies were reviewed demonstrating multilevel lumbar facet arthropathy noted on MRI from 2020   PATIENT SURVEYS:  FOTO Primary score: 30% with goal 43% 14th visit  SCREENING FOR RED FLAGS: none  COGNITION: Overall cognitive status: Within functional limits for tasks assessed     SENSATION: Pt reports radicular pain into  ankles ~ twice week after standing too long.  MUSCLE LENGTH: Hamstrings: Right 70 deg; Left 80 deg   POSTURE: left pelvic obliquity slight  PALPATION: Moderate TTP  throughout SI area  LUMBAR ROM:  wfl  LOWER EXTREMITY ROM:     wfl  LOWER EXTREMITY MMT:    MMT Right eval Left eval  Hip flexion 4+ 3+  Hip extension    Hip abduction 5 5  Hip adduction 5 5  Hip internal rotation    Hip external rotation    Knee flexion 5 5  Knee extension 5 5  Ankle dorsiflexion    Ankle plantarflexion    Ankle inversion    Ankle eversion     (Blank rows = not tested)  LUMBAR SPECIAL TESTS:  Straight leg raise test: Negative, Slump test: Negative, Stork standing: Negative, FABER test: positive, and Thomas test: Negative  FUNCTIONAL TESTS:  30 seconds chair stand test: 3 Timed up and go (TUG): 17.36 4 stage balance: WFL  06/28/22:11.64 TUG  GAIT: Distance walked: 400 ft Assistive device utilized: None Level of assistance: Complete Independence Comments: antalgic, slowed cadence  TODAY'S TREATMENT:                                                                                                                              Pt seen for aquatic therapy today.  Treatment took place in water 3.5-3.75 ft in depth at the [facility] pool. Temp of water was 91.  Pt entered/exited the pool via stairs independently with bilat rail.  *UE support white barbell: forward and backward amb x 4 laps. High knee marching forward * side stepping with rainbow hand floats and arm addct x 1 lap; * marching in place with rainbow * at rails:  fig 4 stretch 15s x 2 each LE; adductor stretch  * holding wall:  heel/toe raises x 10; straight LE circles x 10 CCW/CW; squats x 10 * return to walking holding barball- forward/backward 1 lap. * walking forward/backwardwithout support 1/2 width x 3 reps  * tandem gait forward with barbell (challenge!);  tandem gait backward / forward with RUE on wall  (improved)  Pt requires the buoyancy and hydrostatic pressure of water for support, and to offload joints by unweighting joint load by at least 50 % in navel deep water and by at least 75-80% in chest to neck deep water.  Viscosity of the water is needed for resistance of strengthening. Water current perturbations provides challenge to standing balance requiring increased core activation.  PATIENT EDUCATION:  Education details:aquatic therapy progressions   Person educated: Patient Education method: Explanation Education comprehension: verbalized understanding  HOME EXERCISE PROGRAM: TBA Verbally discussed for land- mini squats, hip abdct/ addct, trunk ext, and marching at counter  ASSESSMENT:  CLINICAL IMPRESSION: Pt able to progress exercises today requiring increased core engagement and balance with decrease in pain. Good tolerance for all exercises today. Trialed more balance exercises - which were challenging, and without LOB.  TUG time has improved; not yet at goal, but close. Pt inquiring on what land exercises to complete- advised on exercises to avoid for now.  Will transition to land soon to create land HEP.   Pt has partially met her goals.   FROM EVAL: Patient is a 62 y.o. f who was seen today for physical therapy evaluation and treatment for SIJ disease and lumbosacral spondylosis. She has had chronic LBP for several years with surgical intervention >5 yrs ago. Pain appears to be multifactorial with SIJ and as per MD "facet arthropathy mediated" pain.  She tests with only minor weakness in hips but is highly pain sensitive with movement having significant difficulty with functional mobility (STS and sup<>sit).  She will benefit from skilled physical therapy intervention.  Will plan to begin aquatic based as she will benefit from the properties of water to reduce pain, improve movement and hip strength then will transition to land based for further progression.    OBJECTIVE  IMPAIRMENTS: decreased activity tolerance, decreased mobility, difficulty walking, decreased strength, improper body mechanics, postural dysfunction, and pain.   ACTIVITY LIMITATIONS: carrying, lifting, bending, sitting, standing, squatting, stairs, transfers, and bed mobility  PARTICIPATION LIMITATIONS: meal prep, cleaning, shopping, community activity, and yard work  PERSONAL FACTORS: Fitness and Time since onset of injury/illness/exacerbation are also affecting patient's functional outcome.   REHAB POTENTIAL: Good  CLINICAL DECISION MAKING: Evolving/moderate complexity  EVALUATION COMPLEXITY: Moderate   GOALS: Goals reviewed with patient? Yes  SHORT TERM GOALS: Target date:   Pt will tolerate full aquatic sessions consistently without increase in pain and with improving function to demonstrate good toleration and effectiveness of intervention.   Baseline: Goal status: MET - 06/23/22  2.  Pt will tolerate STS submerged from bench onto blue step x10 consecutive reps without pain Baseline: unable on land Goal status: MET - 06/23/22  3.  Pt will report decrease in overall  pain to </= 6/10 for improved toleration to activity/quality of life and to demonstrate improved management of pain.  Baseline: 9-10/10 at eval:   7/10- 06/28/22 Goal status:ONGOING   4.  Pt will tolerate stair climbing using alternating pattern ascending and descending 6 steps without use of handrail in aquatic setting Baseline: step to and painful Goal status: PARTIALLY MET - 06/23/22   LONG TERM GOALS: Target date: 07/17/22  Pt to meet stated Foto Goal of 43% Baseline: 30% Goal status: INITIAL  2.  Pt will be indep with final HEP's (land and aquatic as appropriate) for continued management of condition  Baseline: none Goal status: INITIAL  3.  Pt will improve on 30s STS test to >or=  7  to demonstrate improving functional lower extremity strength, transitional movements, and balance  Baseline: 3 Goal  status: INITIAL  4.  Pt will improve on Tug test to <or= 10s to demonstrate improvement in lower extremity function, mobility and decreased fall risk.  Baseline: 17.36 at eval;  11.64 06/28/22 Goal status: ONGOING   5.  Pt will report decrease in worst pain to </= 4/10 for improved toleration to activity/quality of life and to demonstrate improved management of pain.  Baseline: 9-10/10 at eval;  7/10 06/28/22 Goal status: ONGOING   6.  Pt will report improved toleration to activity on daily basis as prior to the onset of skilled physical therapy intervention. Baseline: minimal toleration to any activity Goal status: INITIAL  PLAN:  PT FREQUENCY: 1-2x/week  PT DURATION: 8 weeks  12 visit (allowing for scheduling conflicts)  PLANNED INTERVENTIONS: Therapeutic exercises, Therapeutic activity, Neuromuscular re-education, Balance training, Gait training, Patient/Family education, Self Care, Joint mobilization, Stair training, DME instructions, Aquatic Therapy, Dry Needling,  Taping, Ionotophoresis 4mg /ml Dexamethasone, Manual therapy, and Re-evaluation.  PLAN FOR NEXT SESSION: aquatic LE and lumbosacral mobility and strength; stretching, stair and transfer training, pain relief Land: body mechanics; sup<>sit/STS transfer, stair climbing; lumbosacral mobilization/strengthening; stretching program  Mayer Camel, PTA 06/28/22 1:04 PM [facility] [address] [place], [place], [address] Phone: [phone]   Fax:  [phone]

## 2022-06-29 ENCOUNTER — Other Ambulatory Visit: Payer: Self-pay | Admitting: Internal Medicine

## 2022-06-30 ENCOUNTER — Ambulatory Visit (HOSPITAL_BASED_OUTPATIENT_CLINIC_OR_DEPARTMENT_OTHER): Payer: Medicare HMO | Admitting: Physical Therapy

## 2022-06-30 ENCOUNTER — Telehealth: Payer: Self-pay | Admitting: Internal Medicine

## 2022-06-30 ENCOUNTER — Encounter (HOSPITAL_BASED_OUTPATIENT_CLINIC_OR_DEPARTMENT_OTHER): Payer: Self-pay | Admitting: Physical Therapy

## 2022-06-30 DIAGNOSIS — M47817 Spondylosis without myelopathy or radiculopathy, lumbosacral region: Secondary | ICD-10-CM | POA: Diagnosis not present

## 2022-06-30 DIAGNOSIS — M533 Sacrococcygeal disorders, not elsewhere classified: Secondary | ICD-10-CM

## 2022-06-30 DIAGNOSIS — M6281 Muscle weakness (generalized): Secondary | ICD-10-CM | POA: Diagnosis not present

## 2022-06-30 MED ORDER — LORAZEPAM 1 MG PO TABS: ORAL_TABLET | ORAL | 2 refills | Status: DC

## 2022-06-30 NOTE — Telephone Encounter (Signed)
Md is out of the office until Monday. Office policy 24-72 hours turn around time for refills. Will forward to MD desktop to review.Marland KitchenRaechel Chute

## 2022-06-30 NOTE — Telephone Encounter (Signed)
Done erx 

## 2022-06-30 NOTE — Therapy (Signed)
OUTPATIENT PHYSICAL THERAPY THORACOLUMBAR TREATMENT   Patient Name: [name] Walker MRN: [id number] DOB:[birth year], 62 y.o., female Today's Date: 06/30/2022  END OF SESSION:  PT End of Session - 06/30/22 1040     Visit Number 8    Number of Visits 12    Date for PT Re-Evaluation 07/17/22    Authorization Type [employer] MCR    PT Start Time 1031    PT Stop Time 1110    PT Time Calculation (min) 39 min    Activity Tolerance Patient tolerated treatment well    Behavior During Therapy WFL for tasks assessed/performed              Past Medical History:  Diagnosis Date   ACHILLES TENDINITIS    ALLERGIC RHINITIS    Allergy    Anginal pain (HCC) 04/04/2016   Pt currently feels like she is having muscle spasms and aching in her chest   Anxiety    Asthma    Chronic kidney disease    Depression    GERD    History of kidney stones    HYPERTENSION    LATERAL EPICONDYLITIS, RIGHT    LOW BACK PAIN    Plantar fascial fibromatosis    Stress    SUBACROMIAL BURSITIS, RIGHT    Thoracic scoliosis 10/01/2015   TIA (transient ischemic attack)    Past Surgical History:  Procedure Laterality Date   ANTERIOR CERVICAL DECOMP/DISCECTOMY FUSION N/A 04/07/2016   Procedure: Cervical two-three Anterior cervical decompression/discectomy/fusion;  Surgeon: Coletta Memos, MD;  Location: [facility] OR;  Service: Neurosurgery;  Laterality: N/A;   BACK SURGERY  2015   lower back   EXTRACORPOREAL SHOCK WAVE LITHOTRIPSY Left 09/28/2016   Procedure: LEFT EXTRACORPOREAL SHOCK WAVE LITHOTRIPSY (ESWL);  Surgeon: Jerilee Field, MD;  Location: [facility] ORS;  Service: Urology;  Laterality: Left;   EXTRACORPOREAL SHOCK WAVE LITHOTRIPSY Right 04/08/2018   Procedure: EXTRACORPOREAL SHOCK WAVE LITHOTRIPSY (ESWL);  Surgeon: Crist Fat, MD;  Location: [facility] OR[facility];  Service: Urology;  Laterality: Right;   KIDNEY SURGERY     LITHOTRIPSY  05/2016   TUBAL LIGATION     Patient Active Problem List   Diagnosis Date Noted   Left  otitis media 10/26/2021   Bacterial ear infection, bilateral 10/17/2021   Pain of right sacroiliac joint 10/07/2021   Chronic low back pain with left-sided sciatica 03/31/2020   Lumbar disc disease 03/02/2020   Left sided sciatica 01/31/2020   Depression 01/29/2020   Hematuria 01/29/2020   Left-sided weakness 10/31/2019   Balance disorder 10/31/2019   Difficulty with speech 10/02/2019   Insomnia 10/02/2019   HLD (hyperlipidemia) 04/14/2019   Acute otalgia, bilateral 04/14/2019   Vitamin D deficiency 04/14/2019   Anxiety with depression 10/06/2018   Neck pain 09/10/2018   Hand swelling 09/10/2018   Bilateral radiating leg pain 04/23/2018   Myalgia 04/23/2018   Right renal stone 04/03/2018   Smoker 04/29/2016   Bilateral hand pain 04/29/2016   HNP (herniated nucleus pulposus), cervical 04/07/2016   Cervical radiculopathy, acute 01/29/2016   Acute upper respiratory infection 01/29/2016   Degenerative disc disease, cervical 01/11/2016   Hyperglycemia 11/17/2015   Angioedema 10/12/2015   Bilateral lower extremity edema 10/12/2015   Mouth sores 10/12/2015   Hypokalemia 10/12/2015   Thoracic scoliosis 10/01/2015   Peripheral edema 10/01/2015   Eczema 10/01/2015   Asthma 10/01/2015   Breast lump on right side at 4 o'clock position 01/21/2014   Hot flashes 10/01/2013   Lumbar radiculopathy 07/17/2012  Grief reaction 07/17/2012   Family history of colon cancer 07/02/2012   OAB (overactive bladder) 03/01/2012   Encounter for well adult exam with abnormal findings 08/23/2010   Plantar fasciitis 08/23/2010   Dysuria 01/05/2010   UTI (urinary tract infection) 10/19/2008   ANKLE PAIN, BILATERAL 10/19/2008   VAGINITIS 09/10/2008   JOINT EFFUSION, ANKLE 09/10/2008   SUBACROMIAL BURSITIS, RIGHT 09/01/2008   ACHILLES TENDINITIS 09/01/2008   LATERAL EPICONDYLITIS, RIGHT 08/18/2008   Essential hypertension 07/12/2007   Allergic rhinitis 07/12/2007   GERD 07/12/2007   Chronic low  back pain 07/12/2007   NEPHROLITHIASIS, HX OF 07/12/2007    PCP: Corwin Levins, MD   REFERRING PROVIDER: Erick Colace, MD   REFERRING DIAG:  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 Yes: NPRS scale: 5/10 Pain location: centralized back, Rt hip  Pain description: ache; throb, sharpe constant Aggravating factors: walking> 200 ft; ~20 mins standing; bending down Relieving factors: meds; lying on heating pad  PRECAUTIONS: Back  WEIGHT BEARING RESTRICTIONS: No  FALLS:  Has patient fallen in last 6 months? No  LIVING ENVIRONMENT: Lives with: lives with their family Lives in: House/apartment Stairs: Yes: External: 3 steps; on right going up Has following equipment at home: Single point cane  OCCUPATION: retired  PLOF: Independent  PATIENT GOALS: walk around and do my normal thing I used to do.  NEXT MD VISIT: next month  OBJECTIVE:   DIAGNOSTIC FINDINGS:  None in chart As per Dr Wynn Banker: Imaging studies were reviewed demonstrating multilevel lumbar facet arthropathy noted on MRI from 2020   PATIENT SURVEYS:  FOTO Primary score: 30% with goal 43% 14th visit  SCREENING FOR RED FLAGS: none  COGNITION: Overall cognitive status: Within functional limits for tasks assessed     SENSATION: Pt reports radicular pain into ankles ~ twice week after standing too long.  MUSCLE  LENGTH: Hamstrings: Right 70 deg; Left 80 deg   POSTURE: left pelvic obliquity slight  PALPATION: Moderate TTP throughout SI area  LUMBAR ROM:  wfl  LOWER EXTREMITY ROM:  wfl  LOWER EXTREMITY MMT:    MMT Right eval Left eval  Hip flexion 4+ 3+  Hip extension    Hip abduction 5 5  Hip adduction 5 5  Hip internal rotation    Hip external rotation    Knee flexion 5 5  Knee extension 5 5  Ankle dorsiflexion    Ankle plantarflexion    Ankle inversion    Ankle eversion     (Blank rows = not tested)  LUMBAR SPECIAL TESTS:  Straight leg raise test: Negative, Slump test: Negative, Stork standing: Negative, FABER test: positive, and Thomas test: Negative  FUNCTIONAL TESTS:  30 seconds chair stand test: 3 Timed up and go (TUG): 17.36 4 stage balance: WFL  06/28/22:11.64 TUG  GAIT: Distance walked: 400 ft Assistive device utilized: None Level of assistance: Complete Independence Comments: antalgic, slowed cadence  TODAY'S TREATMENT:                                                                                                                              Pt seen for aquatic therapy today.  Treatment took place in water 3.5-3.75 ft in depth at the [facility] pool. Temp of water was 91.  Pt entered/exited the pool via stairs independently with bilat rail.  *UE support white barbell: forward and backward amb x 4 laps. High knee marching forward * side stepping with rainbow hand floats and arm addct x 2 lap; * with rainbow hand floats under water at sides - walking forward/ backward  * holding wall:  heel/toe raises x 10; squats x 10; straight LE circles x 10 CCW/CW * STS at bench with feet on blue step in water with UE on barbell:  x 10  * tandem stance with horiz head turns with rainbow hand floats on surface;  tandem gait forward/backward  * return to walking holding hand floats- forward/backward 1 lap. * at rails:  fig 4 stretch 15s x 2 each LE  Pt  requires the buoyancy and hydrostatic pressure of water for support, and to offload joints by unweighting joint load by at least 50 % in navel deep water and by at least 75-80% in chest to neck deep water.  Viscosity of the water is needed for resistance of strengthening. Water current perturbations provides challenge to standing balance requiring increased core activation.  PATIENT EDUCATION:  Education details:aquatic therapy progressions   Person educated: Patient Education method: Explanation Education comprehension: verbalized understanding  HOME EXERCISE PROGRAM: TBA Verbally discussed for land- mini squats, hip abdct/ addct, trunk ext, and marching at counter  ASSESSMENT:  CLINICAL IMPRESSION: Pt able to progress exercises today requiring increased core engagement and balance with decrease in pain. Good tolerance for all exercises today, without increase in pain. She does better with extension based exercises, than flexion.   Will transition to next session and create land HEP.   Pt has partially met her goals. Pt doesn't have access to pool,  and verbalized she will not be seeking access, therefore no aquatic HEP was created.    FROM EVAL: Patient is a 62 y.o. f who was seen today for physical therapy evaluation and treatment for SIJ disease and lumbosacral spondylosis. She has had chronic LBP for several years with surgical intervention >5 yrs ago. Pain appears to be multifactorial with SIJ and as per MD "facet arthropathy mediated" pain.  She tests with only minor weakness in hips but is highly pain sensitive with movement having significant difficulty with functional mobility (STS and sup<>sit).  She will benefit from skilled physical therapy intervention.  Will plan to begin aquatic based as she will benefit from the properties of water to reduce pain, improve movement and hip strength then will transition to land based for further progression.    OBJECTIVE IMPAIRMENTS: decreased  activity tolerance, decreased mobility, difficulty walking, decreased strength, improper body mechanics, postural dysfunction, and pain.   ACTIVITY LIMITATIONS: carrying, lifting, bending, sitting, standing, squatting, stairs, transfers, and bed mobility  PARTICIPATION LIMITATIONS: meal prep, cleaning, shopping, community activity, and yard work  PERSONAL FACTORS: Fitness and Time since onset of injury/illness/exacerbation are also affecting patient's functional outcome.   REHAB POTENTIAL: Good  CLINICAL DECISION MAKING: Evolving/moderate complexity  EVALUATION COMPLEXITY: Moderate   GOALS: Goals reviewed with patient? Yes  SHORT TERM GOALS: Target date:   Pt will tolerate full aquatic sessions consistently without increase in pain and with improving function to demonstrate good toleration and effectiveness of intervention.   Baseline: Goal status: MET - 06/23/22  2.  Pt will tolerate STS submerged from bench onto blue step x10 consecutive reps without pain Baseline: unable on land Goal status: MET - 06/23/22  3.  Pt will report decrease in overall  pain to </= 6/10 for improved toleration to activity/quality of life and to demonstrate improved management of pain.  Baseline: 9-10/10 at eval:   7/10- 06/28/22 Goal status:ONGOING   4.  Pt will tolerate stair climbing using alternating pattern ascending and descending 6 steps without use of handrail in aquatic setting Baseline: step to and painful Goal status: PARTIALLY MET - 06/23/22   LONG TERM GOALS: Target date: 07/17/22  Pt to meet stated Foto Goal of 43% Baseline: 30% Goal status: INITIAL  2.  Pt will be indep with final HEP's (land and aquatic as appropriate) for continued management of condition  Baseline: none Goal status: INITIAL  3.  Pt will improve on 30s STS test to >or=  7  to demonstrate improving functional lower extremity strength, transitional movements, and balance  Baseline: 3 Goal status: INITIAL  4.  Pt  will improve on Tug test to <or= 10s to demonstrate improvement in lower extremity function, mobility and decreased fall risk.  Baseline: 17.36 at eval;  11.64 06/28/22 Goal status: ONGOING   5.  Pt will report decrease in worst pain to </= 4/10 for improved toleration to activity/quality of life and to demonstrate improved management of pain.  Baseline: 9-10/10 at eval;  7/10 06/28/22 Goal status: ONGOING   6.  Pt will report improved toleration to activity on daily basis as prior to the onset of skilled physical therapy intervention. Baseline: minimal toleration to any activity Goal status: INITIAL  PLAN:  PT FREQUENCY: 1-2x/week  PT DURATION: 8 weeks  12 visit (allowing for scheduling conflicts)  PLANNED INTERVENTIONS: Therapeutic exercises, Therapeutic activity, Neuromuscular re-education, Balance training, Gait training, Patient/Family education, Self Care, Joint mobilization, Stair training, DME instructions, Aquatic Therapy, Dry Needling, Taping, Ionotophoresis  4mg /ml Dexamethasone, Manual therapy, and Re-evaluation.  PLAN FOR NEXT SESSION: lumbosacral mobility and strength; stretching, stair and transfer training, pain relief Land: body mechanics; sup<>sit/STS transfer, stair climbing; lumbosacral mobilization/strengthening; stretching program  Mayer Camel, PTA 06/30/22 12:47 PM [facility] [address] [place], [place], [address] Phone: [phone]   Fax:  [phone]

## 2022-06-30 NOTE — Telephone Encounter (Signed)
Prescription Request  06/30/2022  LOV: 10/25/2021  What is the name of the medication or equipment? LORazepam (ATIVAN) 1 MG tablet   Have you contacted your pharmacy to request a refill? Yes   Which pharmacy would you like this sent to?    [facility] - [place], [place] - [address] [place] [address] Phone: [phone] Fax: [phone]   Patient notified that their request is being sent to the clinical staff for review and that they should receive a response within 2 business days.   Please advise at Mobile [phone] (mobile)

## 2022-06-30 NOTE — Telephone Encounter (Signed)
Notified pt rx sent to [facility],,,/[facility]

## 2022-07-05 ENCOUNTER — Ambulatory Visit (HOSPITAL_BASED_OUTPATIENT_CLINIC_OR_DEPARTMENT_OTHER): Payer: Medicare HMO

## 2022-07-05 ENCOUNTER — Encounter (HOSPITAL_BASED_OUTPATIENT_CLINIC_OR_DEPARTMENT_OTHER): Payer: Self-pay

## 2022-07-05 DIAGNOSIS — M533 Sacrococcygeal disorders, not elsewhere classified: Secondary | ICD-10-CM | POA: Diagnosis not present

## 2022-07-05 DIAGNOSIS — M6281 Muscle weakness (generalized): Secondary | ICD-10-CM | POA: Diagnosis not present

## 2022-07-05 DIAGNOSIS — M47817 Spondylosis without myelopathy or radiculopathy, lumbosacral region: Secondary | ICD-10-CM | POA: Diagnosis not present

## 2022-07-05 NOTE — Therapy (Signed)
OUTPATIENT PHYSICAL THERAPY THORACOLUMBAR TREATMENT   Patient Name: [name] Walker MRN: [id number] DOB:1961/01/30, 62 y.o., female Today's Date: 07/05/2022  END OF SESSION:  PT End of Session - 07/05/22 1020     Visit Number 9    Number of Visits 12    Date for PT Re-Evaluation 07/17/22    Authorization Type [employer] MCR    PT Start Time 1020    PT Stop Time 1104    PT Time Calculation (min) 44 min    Activity Tolerance Patient tolerated treatment well    Behavior During Therapy WFL for tasks assessed/performed              Past Medical History:  Diagnosis Date   ACHILLES TENDINITIS    ALLERGIC RHINITIS    Allergy    Anginal pain (HCC) 04/04/2016   Pt currently feels like she is having muscle spasms and aching in her chest   Anxiety    Asthma    Chronic kidney disease    Depression    GERD    History of kidney stones    HYPERTENSION    LATERAL EPICONDYLITIS, RIGHT    LOW BACK PAIN    Plantar fascial fibromatosis    Stress    SUBACROMIAL BURSITIS, RIGHT    Thoracic scoliosis 10/01/2015   TIA (transient ischemic attack)    Past Surgical History:  Procedure Laterality Date   ANTERIOR CERVICAL DECOMP/DISCECTOMY FUSION N/A 04/07/2016   Procedure: Cervical two-three Anterior cervical decompression/discectomy/fusion;  Surgeon: Coletta Memos, MD;  Location: [facility] OR;  Service: Neurosurgery;  Laterality: N/A;   BACK SURGERY  2015   lower back   EXTRACORPOREAL SHOCK WAVE LITHOTRIPSY Left 09/28/2016   Procedure: LEFT EXTRACORPOREAL SHOCK WAVE LITHOTRIPSY (ESWL);  Surgeon: Jerilee Field, MD;  Location: [facility] ORS;  Service: Urology;  Laterality: Left;   EXTRACORPOREAL SHOCK WAVE LITHOTRIPSY Right 04/08/2018   Procedure: EXTRACORPOREAL SHOCK WAVE LITHOTRIPSY (ESWL);  Surgeon: Crist Fat, MD;  Location: [facility] ORS;  Service: Urology;  Laterality: Right;   KIDNEY SURGERY     LITHOTRIPSY  05/2016   TUBAL LIGATION     Patient Active Problem List   Diagnosis Date Noted   Left  otitis media 10/26/2021   Bacterial ear infection, bilateral 10/17/2021   Pain of right sacroiliac joint 10/07/2021   Chronic low back pain with left-sided sciatica 03/31/2020   Lumbar disc disease 03/02/2020   Left sided sciatica 01/31/2020   Depression 01/29/2020   Hematuria 01/29/2020   Left-sided weakness 10/31/2019   Balance disorder 10/31/2019   Difficulty with speech 10/02/2019   Insomnia 10/02/2019   HLD (hyperlipidemia) 04/14/2019   Acute otalgia, bilateral 04/14/2019   Vitamin D deficiency 04/14/2019   Anxiety with depression 10/06/2018   Neck pain 09/10/2018   Hand swelling 09/10/2018   Bilateral radiating leg pain 04/23/2018   Myalgia 04/23/2018   Right renal stone 04/03/2018   Smoker 04/29/2016   Bilateral hand pain 04/29/2016   HNP (herniated nucleus pulposus), cervical 04/07/2016   Cervical radiculopathy, acute 01/29/2016   Acute upper respiratory infection 01/29/2016   Degenerative disc disease, cervical 01/11/2016   Hyperglycemia 11/17/2015   Angioedema 10/12/2015   Bilateral lower extremity edema 10/12/2015   Mouth sores 10/12/2015   Hypokalemia 10/12/2015   Thoracic scoliosis 10/01/2015   Peripheral edema 10/01/2015   Eczema 10/01/2015   Asthma 10/01/2015   Breast lump on right side at 4 o'clock position 01/21/2014   Hot flashes 10/01/2013   Lumbar radiculopathy 07/17/2012  Grief reaction 07/17/2012   Family history of colon cancer 07/02/2012   OAB (overactive bladder) 03/01/2012   Encounter for well adult exam with abnormal findings 08/23/2010   Plantar fasciitis 08/23/2010   Dysuria 01/05/2010   UTI (urinary tract infection) 10/19/2008   ANKLE PAIN, BILATERAL 10/19/2008   VAGINITIS 09/10/2008   JOINT EFFUSION, ANKLE 09/10/2008   SUBACROMIAL BURSITIS, RIGHT 09/01/2008   ACHILLES TENDINITIS 09/01/2008   LATERAL EPICONDYLITIS, RIGHT 08/18/2008   Essential hypertension 07/12/2007   Allergic rhinitis 07/12/2007   GERD 07/12/2007   Chronic low  back pain 07/12/2007   NEPHROLITHIASIS, HX OF 07/12/2007    PCP: Corwin Levins, MD   REFERRING PROVIDER: Erick Colace, MD   REFERRING DIAG:  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 States she is trying to join the [employer] so she can use the pool.  PERTINENT HISTORY:  Postlaminectomy syndrome, lumbar region    PAIN:  Are you having pain? Yes: NPRS scale: 5/10 Pain location: centralized back, bil hips Pain description: ache; throb, sharpe constant Aggravating factors: walking> 200 ft; ~20 mins standing; bending down Relieving factors: meds; lying on heating pad  PRECAUTIONS: Back  WEIGHT BEARING RESTRICTIONS: No  FALLS:  Has patient fallen in last 6 months? No  LIVING ENVIRONMENT: Lives with: lives with their family Lives in: House/apartment Stairs: Yes: External: 3 steps; on right going up Has following equipment at home: Single point cane  OCCUPATION: retired  PLOF: Independent  PATIENT GOALS: walk around and do my normal thing I used to do.  NEXT MD VISIT: next month  OBJECTIVE:   DIAGNOSTIC FINDINGS:  None in chart As per Dr Wynn Banker: Imaging studies were reviewed demonstrating multilevel lumbar facet arthropathy noted on MRI from 2020   PATIENT SURVEYS:  FOTO Primary score: 30% with goal 43% 14th visit  SCREENING FOR RED FLAGS: none  COGNITION: Overall cognitive status: Within functional limits for tasks  assessed     SENSATION: Pt reports radicular pain into ankles ~ twice week after standing too long.  MUSCLE LENGTH: Hamstrings: Right 70 deg; Left 80 deg   POSTURE: left pelvic  obliquity slight  PALPATION: Moderate TTP throughout SI area  LUMBAR ROM:  wfl  LOWER EXTREMITY ROM:     wfl  LOWER EXTREMITY MMT:    MMT Right eval Left eval  Hip flexion 4+ 3+  Hip extension    Hip abduction 5 5  Hip adduction 5 5  Hip internal rotation    Hip external rotation    Knee flexion 5 5  Knee extension 5 5  Ankle dorsiflexion    Ankle plantarflexion    Ankle inversion    Ankle eversion     (Blank rows = not tested)  LUMBAR SPECIAL TESTS:  Straight leg raise test: Negative, Slump test: Negative, Stork standing: Negative, FABER test: positive, and Thomas test: Negative  FUNCTIONAL TESTS:  30 seconds chair stand test: 3 Timed up and go (TUG): 17.36 4 stage balance: WFL  06/28/22:11.64 TUG  GAIT: Distance walked: 400 ft Assistive device utilized: None Level of assistance: Complete Independence Comments: antalgic, slowed cadence  TODAY'S TREATMENT:                                                                                                                              5/8: Manual HSS bilateral 30sec x2 PPT in hooklying 5" 2x10 Sidelying hip abduction 2x10ea Sit to stands 1x10 Standing hip abduction 1x10ea Seated ball roll out for lumbar stretch (ball on plinth) 5" hold x10 HEP hand out and review  Previous:  Pt seen for aquatic therapy today.  Treatment took place in water 3.5-3.75 ft in depth at the [facility] pool. Temp of water was 91.  Pt entered/exited the pool via stairs independently with bilat rail.  *UE support white barbell: forward and backward amb x 4 laps. High knee marching forward * side stepping with rainbow hand floats and arm addct x 2 lap; * with rainbow hand floats under water at sides - walking forward/ backward  * holding wall:   heel/toe raises x 10; squats x 10; straight LE circles x 10 CCW/CW * STS at bench with feet on blue step in water with UE on barbell:  x 10  * tandem stance with horiz head turns with rainbow hand floats on surface;  tandem gait forward/backward  * return to walking holding hand floats- forward/backward 1 lap. * at rails:  fig 4 stretch 15s x 2 each LE  Pt requires the buoyancy and hydrostatic pressure of water for support, and to offload joints by unweighting joint load by at least 50 % in navel deep water and by at least 75-80% in chest to neck deep water.  Viscosity of the water is needed for resistance of strengthening. Water current perturbations provides challenge to standing balance requiring increased core activation.  PATIENT EDUCATION:  Education details:aquatic therapy progressions   Person educated: Patient Education method: Explanation Education comprehension: verbalized understanding  HOME EXERCISE PROGRAM:  *Provide aquatic HEP if joins at [facility]  Access Code: [email or web address] URL: [email or web address] Date:  07/05/2022 Prepared by: Riki Altes  Exercises - Seated Hamstring Stretch  - 2 x daily - 7 x weekly - 3 sets - 30seconds hold - Supine Posterior Pelvic Tilt  - 1-2 x daily - 7 x weekly - 2 sets - 10 reps - 5 seconds hold - Sidelying Hip Abduction  - 1-2 x daily - 7 x weekly - 2-3 sets - 10 reps - Standing Hip Flexor Stretch  - 1 x daily - 7 x weekly - 3 sets - 20econds hold - Sit to Stand Without Arm Support  - 1-2 x daily - 7 x weekly - 1-2 sets - 10 reps   ASSESSMENT:  CLINICAL IMPRESSION:  Good tolerance for first land appt. She had some discomfort in low back with laying supine. Very tender to palpation of lumbar paraspinals and R hip. Utilized gentle manual techniques to decrease tightness here. Required cues for correct performance with PPT.  Mild fatigue with sidelying hip abduction. Pt has SIJ injection tomorrow morning. We will cancel her Friday  PT appt to allow time to adjust to this. Provided pt with land HEP for her to work on over the weekend, with instructions to modify exercises as needed to accommodate for pain level. Will progress as tolerated. Ensure correct performance of HEP next visit.  FROM EVAL: Patient is a 62 y.o. f who was seen today for physical therapy evaluation and treatment for SIJ disease and lumbosacral spondylosis. She has had chronic LBP for several years with surgical intervention >5 yrs ago. Pain appears to be multifactorial with SIJ and as per MD "facet arthropathy mediated" pain.  She tests with only minor weakness in hips but is highly pain sensitive with movement having significant difficulty with functional mobility (STS and sup<>sit).  She will benefit from skilled physical therapy intervention.  Will plan to begin aquatic based as she will benefit from the properties of water to reduce pain, improve movement and hip strength then will transition to land based for further progression.    OBJECTIVE IMPAIRMENTS: decreased activity tolerance, decreased mobility, difficulty walking, decreased strength, improper body mechanics, postural dysfunction, and pain.   ACTIVITY LIMITATIONS: carrying, lifting, bending, sitting, standing, squatting, stairs, transfers, and bed mobility  PARTICIPATION LIMITATIONS: meal prep, cleaning, shopping, community activity, and yard work  PERSONAL FACTORS: Fitness and Time since onset of injury/illness/exacerbation are also affecting patient's functional outcome.   REHAB POTENTIAL: Good  CLINICAL DECISION MAKING: Evolving/moderate complexity  EVALUATION COMPLEXITY: Moderate   GOALS: Goals reviewed with patient? Yes  SHORT TERM GOALS: Target date:   Pt will tolerate full aquatic sessions consistently without increase in pain and with improving function to demonstrate good toleration and effectiveness of intervention.   Baseline: Goal status: MET - 06/23/22  2.  Pt will  tolerate STS submerged from bench onto blue step x10 consecutive reps without pain Baseline: unable on land Goal status: MET - 06/23/22  3.  Pt will report decrease in overall  pain to </= 6/10 for improved toleration to activity/quality of life and to demonstrate improved management of pain.  Baseline: 9-10/10 at eval:   7/10- 06/28/22 Goal status:ONGOING   4.  Pt will tolerate stair climbing using alternating pattern ascending and descending 6 steps without use of handrail in aquatic setting Baseline: step to and painful Goal status: PARTIALLY MET - 06/23/22   LONG TERM GOALS: Target date: 07/17/22  Pt to meet stated Foto Goal of 43% Baseline: 30% Goal status: INITIAL  2.  Pt will be  indep with final HEP's (land and aquatic as appropriate) for continued management of condition  Baseline: none Goal status: INITIAL  3.  Pt will improve on 30s STS test to >or=  7  to demonstrate improving functional lower extremity strength, transitional movements, and balance  Baseline: 3 Goal status: INITIAL  4.  Pt will improve on Tug test to <or= 10s to demonstrate improvement in lower extremity function, mobility and decreased fall risk.  Baseline: 17.36 at eval;  11.64 06/28/22 Goal status: ONGOING   5.  Pt will report decrease in worst pain to </= 4/10 for improved toleration to activity/quality of life and to demonstrate improved management of pain.  Baseline: 9-10/10 at eval;  7/10 06/28/22 Goal status: ONGOING   6.  Pt will report improved toleration to activity on daily basis as prior to the onset of skilled physical therapy intervention. Baseline: minimal toleration to any activity Goal status: INITIAL  PLAN:  PT FREQUENCY: 1-2x/week  PT DURATION: 8 weeks  12 visit (allowing for scheduling conflicts)  PLANNED INTERVENTIONS: Therapeutic exercises, Therapeutic activity, Neuromuscular re-education, Balance training, Gait training, Patient/Family education, Self Care, Joint mobilization,  Stair training, DME instructions, Aquatic Therapy, Dry Needling, Taping, Ionotophoresis 4mg /ml Dexamethasone, Manual therapy, and Re-evaluation.  PLAN FOR NEXT SESSION: lumbosacral mobility and strength; stretching, stair and transfer training, pain relief Land: body mechanics; sup<>sit/STS transfer, stair climbing; lumbosacral mobilization/strengthening; stretching program  Riki Altes, PTA  07/05/22 11:37 AM [facility] [address] [place], [place], [address] Phone: [phone]   Fax:  [phone]

## 2022-07-06 ENCOUNTER — Encounter: Payer: Self-pay | Admitting: Physical Medicine & Rehabilitation

## 2022-07-06 ENCOUNTER — Encounter: Payer: Medicare HMO | Attending: Physical Medicine and Rehabilitation | Admitting: Physical Medicine & Rehabilitation

## 2022-07-06 VITALS — BP 126/80 | HR 76 | Ht 62.0 in | Wt 151.0 lb

## 2022-07-06 DIAGNOSIS — M533 Sacrococcygeal disorders, not elsewhere classified: Secondary | ICD-10-CM | POA: Diagnosis not present

## 2022-07-06 MED ORDER — LIDOCAINE HCL (PF) 1 % IJ SOLN: 2.0000 mL | Freq: Once | INTRAMUSCULAR | Status: DC

## 2022-07-06 MED ORDER — BETAMETHASONE SOD PHOS & ACET 6 (3-3) MG/ML IJ SUSP
6.0000 mg | Freq: Once | INTRAMUSCULAR | Status: AC
  Administered 2022-07-06: 6 mg

## 2022-07-06 MED ORDER — LIDOCAINE HCL (PF) 2 % IJ SOLN
2.0000 mL | Freq: Once | INTRAMUSCULAR | Status: AC
  Administered 2022-07-06: 2 mL

## 2022-07-06 MED ORDER — LIDOCAINE HCL 1 % IJ SOLN
1.0000 mL | Freq: Once | INTRAMUSCULAR | Status: AC
  Administered 2022-07-06: 1 mL

## 2022-07-06 MED ORDER — IOHEXOL 180 MG/ML  SOLN
1.0000 mL | Freq: Once | INTRAMUSCULAR | Status: AC
  Administered 2022-07-06: 1 mL

## 2022-07-06 NOTE — Patient Instructions (Signed)
Sacroiliac injection was performed today. A combination of numbing medicine (lidocaine) plus a cortisone medicine (betamethasone) was injected. The injection was done under x-ray guidance. This procedure has been performed to help reduce low back and buttocks pain as well as potentially hip pain. The duration of this injection is variable lasting from hours to  Months. It may repeated if needed. 

## 2022-07-06 NOTE — Progress Notes (Signed)
Left sacroiliac injection under fluoroscopic guidance  Indication: Left Low back and buttocks pain not relieved by medication management and other conservative care. Has had itching with iodinated contrast in past with CT abd but tolerated amount used of SI inj in past  Informed consent was obtained after describing risks and benefits of the procedure with the patient, this includes bleeding, bruising, infection, paralysis and medication side effects. The patient wishes to proceed and has given written consent. The patient was placed in a prone position. The lumbar and sacral area was marked and prepped with Betadine. A 25-gauge 1-1/2 inch needle was inserted into the skin and subcutaneous tissue and 2 mL of 1% lidocaine was injected. Then a 25-gauge 3 inch spinal needle was inserted under fluoroscopic guidance into the left sacroiliac joint. AP and lateral images were utilized. Omnipaque 180x0.5 mL under live fluoroscopy demonstrated no intravascular uptake. Then a solution containing one ML of 6 mg per mLbetamethasone and 2 ML of 2% lidocaine MPF was injected x1.5 mL. Patient tolerated the procedure well. Post procedure instructions were given. Please see post procedure form.   Pt c/o increased Right sacroiliac region pain with schedule for Right sided injection in 1 mo

## 2022-07-06 NOTE — Progress Notes (Signed)
  PROCEDURE RECORD [facility]   Name: [name] Walker DOB:04-Jun-1960 MRN: [id number]  Date:07/06/2022  Physician: Claudette Laws, MD    Nurse/CMA: Kennedee Kitzmiller RN  Allergies:  Allergies  Allergen Reactions   Flexeril [Cyclobenzaprine] Swelling    Tongue swells   Lisinopril Other (See Comments)    ? Possible tongue swelling   Other reaction(s): Other (See Comments)  ? Possible tongue swelling  ? Possible tongue swelling , , ? Possible tongue swelling   Other Palpitations    ALL MUSCLE RELAXERS-PER PATIENT   Robaxin [Methocarbamol] Other (See Comments)    Tongue swelling   Contrast Media [Iodinated Contrast Media] Itching   Iodine Itching   Gabapentin Other (See Comments)   Pregabalin Anxiety and Swelling    Anxiety attacks    Consent Signed: Yes.    Is patient diabetic? No.  CBG today?   Pregnant: No. LMP: Patient's last menstrual period was 08/22/2010. (age 80-55)  Anticoagulants: no Anti-inflammatory: yes (asa 81 mg) Antibiotics: no  Procedure: right sacroiliac steroid injection  Position: Prone Start Time: 1007  End Time: 1009  Fluoro Time: 17 sec  RN/CMA Haematologist RN    Time 940 1015    BP 126/80 135/80    Pulse 76 65    Respirations 14 14    O2 Sat 98 98    S/S 6 6    Pain Level 7 5     D/C home with son, patient A & O X 3, D/C instructions reviewed, and sits independently.

## 2022-07-07 ENCOUNTER — Encounter (HOSPITAL_BASED_OUTPATIENT_CLINIC_OR_DEPARTMENT_OTHER): Payer: BC Managed Care – PPO

## 2022-07-12 ENCOUNTER — Encounter (HOSPITAL_BASED_OUTPATIENT_CLINIC_OR_DEPARTMENT_OTHER): Payer: Self-pay

## 2022-07-12 ENCOUNTER — Ambulatory Visit (HOSPITAL_BASED_OUTPATIENT_CLINIC_OR_DEPARTMENT_OTHER): Payer: Medicare HMO

## 2022-07-12 DIAGNOSIS — M533 Sacrococcygeal disorders, not elsewhere classified: Secondary | ICD-10-CM | POA: Diagnosis not present

## 2022-07-12 DIAGNOSIS — M47817 Spondylosis without myelopathy or radiculopathy, lumbosacral region: Secondary | ICD-10-CM | POA: Diagnosis not present

## 2022-07-12 DIAGNOSIS — M6281 Muscle weakness (generalized): Secondary | ICD-10-CM | POA: Diagnosis not present

## 2022-07-12 LAB — TOXASSURE SELECT,+ANTIDEPR,UR

## 2022-07-12 NOTE — Therapy (Signed)
OUTPATIENT PHYSICAL THERAPY THORACOLUMBAR TREATMENT   Patient Name: [name] Walker MRN: [id number] DOB:[birth year], 62 y.o., female Today's Date: 07/12/2022  END OF SESSION:  PT End of Session - 07/12/22 1201     Visit Number 10    Number of Visits 12    Date for PT Re-Evaluation 07/17/22    Authorization Type [employer] MCR    PT Start Time 1020    PT Stop Time 1102    PT Time Calculation (min) 42 min    Activity Tolerance Patient tolerated treatment well    Behavior During Therapy WFL for tasks assessed/performed              Past Medical History:  Diagnosis Date   ACHILLES TENDINITIS    ALLERGIC RHINITIS    Allergy    Anginal pain (HCC) 04/04/2016   Pt currently feels like she is having muscle spasms and aching in her chest   Anxiety    Asthma    Chronic kidney disease    Depression    GERD    History of kidney stones    HYPERTENSION    LATERAL EPICONDYLITIS, RIGHT    LOW BACK PAIN    Plantar fascial fibromatosis    Stress    SUBACROMIAL BURSITIS, RIGHT    Thoracic scoliosis 10/01/2015   TIA (transient ischemic attack)    Past Surgical History:  Procedure Laterality Date   ANTERIOR CERVICAL DECOMP/DISCECTOMY FUSION N/A 04/07/2016   Procedure: Cervical two-three Anterior cervical decompression/discectomy/fusion;  Surgeon: Coletta Memos, MD;  Location: [facility] OR;  Service: Neurosurgery;  Laterality: N/A;   BACK SURGERY  2015   lower back   EXTRACORPOREAL SHOCK WAVE LITHOTRIPSY Left 09/28/2016   Procedure: LEFT EXTRACORPOREAL SHOCK WAVE LITHOTRIPSY (ESWL);  Surgeon: Jerilee Field, MD;  Location: [facility] ORS;  Service: Urology;  Laterality: Left;   EXTRACORPOREAL SHOCK WAVE LITHOTRIPSY Right 04/08/2018   Procedure: EXTRACORPOREAL SHOCK WAVE LITHOTRIPSY (ESWL);  Surgeon: Crist Fat, MD;  Location: [facility] ORS;  Service: Urology;  Laterality: Right;   KIDNEY SURGERY     LITHOTRIPSY  05/2016   TUBAL LIGATION     Patient Active Problem List   Diagnosis Date Noted   Left  otitis media 10/26/2021   Bacterial ear infection, bilateral 10/17/2021   Pain of right sacroiliac joint 10/07/2021   Chronic low back pain with left-sided sciatica 03/31/2020   Lumbar disc disease 03/02/2020   Left sided sciatica 01/31/2020   Depression 01/29/2020   Hematuria 01/29/2020   Left-sided weakness 10/31/2019   Balance disorder 10/31/2019   Difficulty with speech 10/02/2019   Insomnia 10/02/2019   HLD (hyperlipidemia) 04/14/2019   Acute otalgia, bilateral 04/14/2019   Vitamin D deficiency 04/14/2019   Anxiety with depression 10/06/2018   Neck pain 09/10/2018   Hand swelling 09/10/2018   Bilateral radiating leg pain 04/23/2018   Myalgia 04/23/2018   Right renal stone 04/03/2018   Smoker 04/29/2016   Bilateral hand pain 04/29/2016   HNP (herniated nucleus pulposus), cervical 04/07/2016   Cervical radiculopathy, acute 01/29/2016   Acute upper respiratory infection 01/29/2016   Degenerative disc disease, cervical 01/11/2016   Hyperglycemia 11/17/2015   Angioedema 10/12/2015   Bilateral lower extremity edema 10/12/2015   Mouth sores 10/12/2015   Hypokalemia 10/12/2015   Thoracic scoliosis 10/01/2015   Peripheral edema 10/01/2015   Eczema 10/01/2015   Asthma 10/01/2015   Breast lump on right side at 4 o'clock position 01/21/2014   Hot flashes 10/01/2013   Lumbar radiculopathy 07/17/2012  Grief reaction 07/17/2012   Family history of colon cancer 07/02/2012   OAB (overactive bladder) 03/01/2012   Encounter for well adult exam with abnormal findings 08/23/2010   Plantar fasciitis 08/23/2010   Dysuria 01/05/2010   UTI (urinary tract infection) 10/19/2008   ANKLE PAIN, BILATERAL 10/19/2008   VAGINITIS 09/10/2008   JOINT EFFUSION, ANKLE 09/10/2008   SUBACROMIAL BURSITIS, RIGHT 09/01/2008   ACHILLES TENDINITIS 09/01/2008   LATERAL EPICONDYLITIS, RIGHT 08/18/2008   Essential hypertension 07/12/2007   Allergic rhinitis 07/12/2007   GERD 07/12/2007   Chronic low  back pain 07/12/2007   NEPHROLITHIASIS, HX OF 07/12/2007    PCP: Corwin Levins, MD   REFERRING PROVIDER: Erick Colace, MD   REFERRING DIAG:  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 Has had muscle spasms in bilateral hips since injection. Was sore for 3 days after last land therapy. 7/10 pain level in L side of low back.  PERTINENT HISTORY:  Postlaminectomy syndrome, lumbar region    PAIN:  Are you having pain? Yes: NPRS scale: 7/10 Pain location: Lef tside of low back Pain description: ache; throb, sharpe constant Aggravating factors: walking> 200 ft; ~20 mins standing; bending down Relieving factors: meds; lying on heating pad  PRECAUTIONS: Back  WEIGHT BEARING RESTRICTIONS: No  FALLS:  Has patient fallen in last 6 months? No  LIVING ENVIRONMENT: Lives with: lives with their family Lives in: House/apartment Stairs: Yes: External: 3 steps; on right going up Has following equipment at home: Single point cane  OCCUPATION: retired  PLOF: Independent  PATIENT GOALS: walk around and do my normal thing I used to do.  NEXT MD VISIT: next month  OBJECTIVE:   DIAGNOSTIC FINDINGS:  None in chart As per Dr Wynn Banker: Imaging studies were reviewed demonstrating multilevel lumbar facet arthropathy noted on MRI from 2020   PATIENT SURVEYS:  FOTO Primary score: 30% with goal 43% 14th  visit  SCREENING FOR RED FLAGS: none  COGNITION: Overall cognitive status: Within functional limits for tasks assessed     SENSATION: Pt reports radicular pain into ankles ~  twice week after standing too long.  MUSCLE LENGTH: Hamstrings: Right 70 deg; Left 80 deg   POSTURE: left pelvic obliquity slight  PALPATION: Moderate TTP throughout SI area  LUMBAR ROM:  wfl  LOWER EXTREMITY ROM:     wfl  LOWER EXTREMITY MMT:    MMT Right eval Left eval  Hip flexion 4+ 3+  Hip extension    Hip abduction 5 5  Hip adduction 5 5  Hip internal rotation    Hip external rotation    Knee flexion 5 5  Knee extension 5 5  Ankle dorsiflexion    Ankle plantarflexion    Ankle inversion    Ankle eversion     (Blank rows = not tested)  LUMBAR SPECIAL TESTS:  Straight leg raise test: Negative, Slump test: Negative, Stork standing: Negative, FABER test: positive, and Thomas test: Negative  FUNCTIONAL TESTS:  30 seconds chair stand test: 3 Timed up and go (TUG): 17.36 4 stage balance: WFL  06/28/22:11.64 TUG  GAIT: Distance walked: 400 ft Assistive device utilized: None Level of assistance: Complete Independence Comments: antalgic, slowed cadence  TODAY'S TREATMENT:                                                                                                                               5/15: Manual HSS bilateral 30sec x2 Manual Pirifmoris stretch STM to L gluteal mm and lumbar ps.  LTR x15  PPT in hooklying 5" 2x10 Hooklying hip abduction unilateral GTB-2x10ea Hooklying ball squeeze with TrA 5" 2x10    5/8: Manual HSS bilateral 30sec x2 PPT in hooklying 5" 2x10 Sidelying hip abduction 2x10ea Sit to stands 1x10 Standing hip abduction 1x10ea Seated ball roll out for lumbar stretch (ball on plinth) 5" hold x10 HEP hand out and review  Previous:  Pt seen for aquatic therapy today.  Treatment took place in water 3.5-3.75 ft in depth at the [employer] [facility]  pool. Temp of water was 91.  Pt entered/exited the pool via stairs independently with bilat rail.  *UE support white barbell: forward and backward amb x 4 laps. High knee marching forward * side stepping with rainbow hand floats and arm addct x 2 lap; * with rainbow hand floats under water at sides - walking forward/ backward  * holding wall:  heel/toe raises x 10; squats x 10; straight LE circles x 10 CCW/CW * STS at bench with feet on blue step in water with UE on barbell:  x 10  * tandem stance with horiz head turns with rainbow hand floats on surface;  tandem gait forward/backward  * return to walking holding hand floats- forward/backward 1 lap. * at rails:  fig 4 stretch 15s x 2 each LE  Pt requires the buoyancy and hydrostatic pressure of water for support, and to offload joints by unweighting joint load by at least 50 % in navel deep water and by at least 75-80% in chest to neck deep  water.  Viscosity of the water is needed for resistance of strengthening. Water current perturbations provides challenge to standing balance requiring increased core activation.  PATIENT EDUCATION:  Education details:aquatic therapy progressions   Person educated: Patient Education method: Explanation Education comprehension: verbalized understanding  HOME EXERCISE PROGRAM:  *Provide aquatic HEP if joins at [facility]  Access Code: [email or web address] URL: [email or web address] Date: 07/05/2022 Prepared by: Riki Altes  Exercises - Seated Hamstring Stretch  - 2 x daily - 7 x weekly - 3 sets - 30seconds hold - Supine Posterior Pelvic Tilt  - 1-2 x daily - 7 x weekly - 2 sets - 10 reps - 5 seconds hold - Sidelying Hip Abduction  - 1-2 x daily - 7 x weekly - 2-3 sets - 10 reps - Standing Hip Flexor Stretch  - 1 x daily - 7 x weekly - 3 sets - 20econds hold - Sit to Stand Without Arm Support  - 1-2 x daily - 7 x weekly - 1-2 sets - 10 reps   ASSESSMENT:  CLINICAL IMPRESSION:  Worked on manual  intervention today to decrease tightness in bilateral lumbar paraspinals and L gluteal mm. Very tender to palpation and manual pressure on L side. She was educated about DOMS and management of this with ice/heat at home. Pt inquired about massage therapy and was provided information on this. Good tolerance for gentle core and hip strengthening. Will continue to monitor pain level.   FROM EVAL: Patient is a 62 y.o. f who was seen today for physical therapy evaluation and treatment for SIJ disease and lumbosacral spondylosis. She has had chronic LBP for several years with surgical intervention >5 yrs ago. Pain appears to be multifactorial with SIJ and as per MD "facet arthropathy mediated" pain.  She tests with only minor weakness in hips but is highly pain sensitive with movement having significant difficulty with functional mobility (STS and sup<>sit).  She will benefit from skilled physical therapy intervention.  Will plan to begin aquatic based as she will benefit from the properties of water to reduce pain, improve movement and hip strength then will transition to land based for further progression.    OBJECTIVE IMPAIRMENTS: decreased activity tolerance, decreased mobility, difficulty walking, decreased strength, improper body mechanics, postural dysfunction, and pain.   ACTIVITY LIMITATIONS: carrying, lifting, bending, sitting, standing, squatting, stairs, transfers, and bed mobility  PARTICIPATION LIMITATIONS: meal prep, cleaning, shopping, community activity, and yard work  PERSONAL FACTORS: Fitness and Time since onset of injury/illness/exacerbation are also affecting patient's functional outcome.   REHAB POTENTIAL: Good  CLINICAL DECISION MAKING: Evolving/moderate complexity  EVALUATION COMPLEXITY: Moderate   GOALS: Goals reviewed with patient? Yes  SHORT TERM GOALS: Target date:   Pt will tolerate full aquatic sessions consistently without increase in pain and with improving  function to demonstrate good toleration and effectiveness of intervention.   Baseline: Goal status: MET - 06/23/22  2.  Pt will tolerate STS submerged from bench onto blue step x10 consecutive reps without pain Baseline: unable on land Goal status: MET - 06/23/22  3.  Pt will report decrease in overall  pain to </= 6/10 for improved toleration to activity/quality of life and to demonstrate improved management of pain.  Baseline: 9-10/10 at eval:   7/10- 06/28/22 Goal status:ONGOING   4.  Pt will tolerate stair climbing using alternating pattern ascending and descending 6 steps without use of handrail in aquatic setting Baseline: step to and painful Goal status: PARTIALLY MET - 06/23/22   LONG TERM  GOALS: Target date: 07/17/22  Pt to meet stated Foto Goal of 43% Baseline: 30% Goal status: INITIAL  2.  Pt will be indep with final HEP's (land and aquatic as appropriate) for continued management of condition  Baseline: none Goal status: INITIAL  3.  Pt will improve on 30s STS test to >or=  7  to demonstrate improving functional lower extremity strength, transitional movements, and balance  Baseline: 3 Goal status: INITIAL  4.  Pt will improve on Tug test to <or= 10s to demonstrate improvement in lower extremity function, mobility and decreased fall risk.  Baseline: 17.36 at eval;  11.64 06/28/22 Goal status: ONGOING   5.  Pt will report decrease in worst pain to </= 4/10 for improved toleration to activity/quality of life and to demonstrate improved management of pain.  Baseline: 9-10/10 at eval;  7/10 06/28/22 Goal status: ONGOING   6.  Pt will report improved toleration to activity on daily basis as prior to the onset of skilled physical therapy intervention. Baseline: minimal toleration to any activity Goal status: INITIAL  PLAN:  PT FREQUENCY: 1-2x/week  PT DURATION: 8 weeks  12 visit (allowing for scheduling conflicts)  PLANNED INTERVENTIONS: Therapeutic exercises, Therapeutic  activity, Neuromuscular re-education, Balance training, Gait training, Patient/Family education, Self Care, Joint mobilization, Stair training, DME instructions, Aquatic Therapy, Dry Needling, Taping, Ionotophoresis 4mg /ml Dexamethasone, Manual therapy, and Re-evaluation.  PLAN FOR NEXT SESSION: lumbosacral mobility and strength; stretching, stair and transfer training, pain relief Land: body mechanics; sup<>sit/STS transfer, stair climbing; lumbosacral mobilization/strengthening; stretching program  Riki Altes, PTA  07/12/22 12:04 PM [facility] [address] [place], [place], [address] Phone: [phone]   Fax:  [phone]

## 2022-07-14 ENCOUNTER — Encounter (HOSPITAL_BASED_OUTPATIENT_CLINIC_OR_DEPARTMENT_OTHER): Payer: BC Managed Care – PPO | Admitting: Physical Therapy

## 2022-07-17 ENCOUNTER — Ambulatory Visit (HOSPITAL_BASED_OUTPATIENT_CLINIC_OR_DEPARTMENT_OTHER): Payer: Medicare HMO | Admitting: Physical Therapy

## 2022-07-17 DIAGNOSIS — M47817 Spondylosis without myelopathy or radiculopathy, lumbosacral region: Secondary | ICD-10-CM | POA: Diagnosis not present

## 2022-07-17 DIAGNOSIS — M6281 Muscle weakness (generalized): Secondary | ICD-10-CM | POA: Diagnosis not present

## 2022-07-17 DIAGNOSIS — M533 Sacrococcygeal disorders, not elsewhere classified: Secondary | ICD-10-CM

## 2022-07-17 NOTE — Therapy (Addendum)
OUTPATIENT PHYSICAL THERAPY THORACOLUMBAR TREATMENT/progress note    Patient Name: [name] Walker MRN: [id number] DOB:September 20, 1960, 62 y.o., female Today's Date: 07/18/2022  END OF SESSION:  PT End of Session - 07/18/22 0821     Visit Number 11    Number of Visits 17    Date for PT Re-Evaluation 08/29/22    Authorization Type [employer] MCR    PT Start Time 1017    PT Stop Time 1100    PT Time Calculation (min) 43 min    Activity Tolerance Patient tolerated treatment well    Behavior During Therapy Restless             Progress Note Reporting Period 3/25 to 5/20  See note below for Objective Data and Assessment of Progress/Goals.     Past Medical History:  Diagnosis Date   ACHILLES TENDINITIS    ALLERGIC RHINITIS    Allergy    Anginal pain (HCC) 04/04/2016   Pt currently feels like she is having muscle spasms and aching in her chest   Anxiety    Asthma    Chronic kidney disease    Depression    GERD    History of kidney stones    HYPERTENSION    LATERAL EPICONDYLITIS, RIGHT    LOW BACK PAIN    Plantar fascial fibromatosis    Stress    SUBACROMIAL BURSITIS, RIGHT    Thoracic scoliosis 10/01/2015   TIA (transient ischemic attack)    Past Surgical History:  Procedure Laterality Date   ANTERIOR CERVICAL DECOMP/DISCECTOMY FUSION N/A 04/07/2016   Procedure: Cervical two-three Anterior cervical decompression/discectomy/fusion;  Surgeon: Coletta Memos, MD;  Location: [facility] OR;  Service: Neurosurgery;  Laterality: N/A;   BACK SURGERY  2015   lower back   EXTRACORPOREAL SHOCK WAVE LITHOTRIPSY Left 09/28/2016   Procedure: LEFT EXTRACORPOREAL SHOCK WAVE LITHOTRIPSY (ESWL);  Surgeon: Jerilee Field, MD;  Location: [facility] ORS;  Service: Urology;  Laterality: Left;   EXTRACORPOREAL SHOCK WAVE LITHOTRIPSY Right 04/08/2018   Procedure: EXTRACORPOREAL SHOCK WAVE LITHOTRIPSY (ESWL);  Surgeon: Crist Fat, MD;  Location: [facility] ORS;  Service: Urology;  Laterality: Right;   KIDNEY  SURGERY     LITHOTRIPSY  05/2016   TUBAL LIGATION     Patient Active Problem List   Diagnosis Date Noted   Left otitis media 10/26/2021   Bacterial ear infection, bilateral 10/17/2021   Pain of right sacroiliac joint 10/07/2021   Chronic low back pain with left-sided sciatica 03/31/2020   Lumbar disc disease 03/02/2020   Left sided sciatica 01/31/2020   Depression 01/29/2020   Hematuria 01/29/2020   Left-sided weakness 10/31/2019   Balance disorder 10/31/2019   Difficulty with speech 10/02/2019   Insomnia 10/02/2019   HLD (hyperlipidemia) 04/14/2019   Acute otalgia, bilateral 04/14/2019   Vitamin D deficiency 04/14/2019   Anxiety with depression 10/06/2018   Neck pain 09/10/2018   Hand swelling 09/10/2018   Bilateral radiating leg pain 04/23/2018   Myalgia 04/23/2018   Right renal stone 04/03/2018   Smoker 04/29/2016   Bilateral hand pain 04/29/2016   HNP (herniated nucleus pulposus), cervical 04/07/2016   Cervical radiculopathy, acute 01/29/2016   Acute upper respiratory infection 01/29/2016   Degenerative disc disease, cervical 01/11/2016   Hyperglycemia 11/17/2015   Angioedema 10/12/2015   Bilateral lower extremity edema 10/12/2015   Mouth sores 10/12/2015   Hypokalemia 10/12/2015   Thoracic scoliosis 10/01/2015   Peripheral edema 10/01/2015   Eczema 10/01/2015   Asthma 10/01/2015   Breast lump  on right side at 4 o'clock position 01/21/2014   Hot flashes 10/01/2013   Lumbar radiculopathy 07/17/2012   Grief reaction 07/17/2012   Family history of colon cancer 07/02/2012   OAB (overactive bladder) 03/01/2012   Encounter for well adult exam with abnormal findings 08/23/2010   Plantar fasciitis 08/23/2010   Dysuria 01/05/2010   UTI (urinary tract infection) 10/19/2008   ANKLE PAIN, BILATERAL 10/19/2008   VAGINITIS 09/10/2008   JOINT EFFUSION, ANKLE 09/10/2008   SUBACROMIAL BURSITIS, RIGHT 09/01/2008   ACHILLES TENDINITIS 09/01/2008   LATERAL EPICONDYLITIS,  RIGHT 08/18/2008   Essential hypertension 07/12/2007   Allergic rhinitis 07/12/2007   GERD 07/12/2007   Chronic low back pain 07/12/2007   NEPHROLITHIASIS, HX OF 07/12/2007    PCP: Corwin Levins, MD   REFERRING PROVIDER: Erick Colace, MD   REFERRING DIAG:  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 She did some cooking over the weekedn   PERTINENT HISTORY:  Postlaminectomy syndrome, lumbar region    PAIN:  Are you having pain? Yes: NPRS scale: 5/10 Pain location: Lef tside of low back Pain description: ache; throb, sharpe constant Aggravating factors: walking> 200 ft; ~20 mins standing; bending down Relieving factors: meds; lying on heating pad  PRECAUTIONS: Back  WEIGHT BEARING RESTRICTIONS: No  FALLS:  Has patient fallen in last 6 months? No  LIVING ENVIRONMENT: Lives with: lives with their family Lives in: House/apartment Stairs: Yes: External: 3 steps; on right going up Has following equipment at home: Single point cane  OCCUPATION: retired  PLOF: Independent  PATIENT GOALS: walk around and do my normal thing I used to do.  NEXT MD VISIT: next month  OBJECTIVE:   DIAGNOSTIC FINDINGS:  None in chart As per Dr Wynn Banker: Imaging studies were reviewed demonstrating multilevel lumbar facet arthropathy noted on MRI from 2020   PATIENT SURVEYS:  FOTO Primary score: 30% with goal 43% 14th visit  SCREENING FOR RED  FLAGS: none  COGNITION: Overall cognitive status: Within functional limits for tasks assessed     SENSATION: Pt reports radicular pain into ankles ~ twice week after standing  too long.  MUSCLE LENGTH: Hamstrings: Right 70 deg; Left 80 deg   POSTURE: left pelvic obliquity slight  PALPATION: Moderate TTP throughout SI area  LUMBAR ROM:  wfl  LOWER EXTREMITY ROM:     wfl  LOWER EXTREMITY MMT:    MMT Right eval Left eval Right  5/20 Left 5/20   Hip flexion 4+ 3+ 4+ 4+  Hip extension      Hip abduction 5 5 5 5   Hip adduction 5 5    Hip internal rotation      Hip external rotation      Knee flexion 5 5 5 5   Knee extension 5 5 5 5   Ankle dorsiflexion      Ankle plantarflexion      Ankle inversion      Ankle eversion       (Blank rows = not tested)  LUMBAR SPECIAL TESTS:  Straight leg raise test: Negative, Slump test: Negative, Stork standing: Negative, FABER test: positive, and Thomas test: Negative  FUNCTIONAL TESTS:  30 seconds chair stand test: 3 Timed up and go (TUG): 17.36 4 stage balance: WFL  06/28/22:11.64 TUG  GAIT: Distance walked: 400 ft Assistive device utilized: None Level of assistance: Complete Independence Comments: antalgic, slowed cadence  TODAY'S TREATMENT:                                                                                                                              5/20 Reviewed complete HEP   LTRx20 Hamstring stretch seated 2 x 20-second hold each leg Supine glutes stretch 2 x 20-second hold each leg patient advised to look for the differences between the light right and left leg  Skilled palpation of trigger points to help target and on soft tissue mobilization Reviewed self trigger point release using tennis ball and Thera cane.  Reviewed where to buy Thera cane.  Tests and measures.  Reviewed goals with patient.  Seated hip abduction 2 x 10 palpable patient about RPE making sure her exercises were challenging at  home LAQ 2 x 10 talked about adjusting sets and reps to make sure exercises were challenging  5/15: Manual HSS bilateral 30sec x2 Manual Pirifmoris stretch STM to L gluteal mm and lumbar ps.  LTR x15  PPT in hooklying 5" 2x10 Hooklying hip abduction unilateral GTB-2x10ea Hooklying ball squeeze with TrA 5" 2x10    5/8: Manual HSS bilateral 30sec x2 PPT in hooklying 5" 2x10 Sidelying hip abduction 2x10ea Sit to stands 1x10 Standing hip abduction 1x10ea Seated ball roll out for lumbar stretch (ball on plinth) 5" hold x10 HEP hand out and review  Previous:  Pt seen for aquatic therapy today.  Treatment took place in water 3.5-3.75 ft in depth at the [facility] pool. Temp of water was 91.  Pt entered/exited the pool via stairs independently with bilat rail.  *UE support white barbell: forward and backward amb x 4 laps. High knee marching forward *  side stepping with rainbow hand floats and arm addct x 2 lap; * with rainbow hand floats under water at sides - walking forward/ backward  * holding wall:  heel/toe raises x 10; squats x 10; straight LE circles x 10 CCW/CW * STS at bench with feet on blue step in water with UE on barbell:  x 10  * tandem stance with horiz head turns with rainbow hand floats on surface;  tandem gait forward/backward  * return to walking holding hand floats- forward/backward 1 lap. * at rails:  fig 4 stretch 15s x 2 each LE  Pt requires the buoyancy and hydrostatic pressure of water for support, and to offload joints by unweighting joint load by at least 50 % in navel deep water and by at least 75-80% in chest to neck deep water.  Viscosity of the water is needed for resistance of strengthening. Water current perturbations provides challenge to standing balance requiring increased core activation.  PATIENT EDUCATION:  Education details:aquatic therapy progressions   Person educated: Patient Education method: Explanation Education comprehension:  verbalized understanding  HOME EXERCISE PROGRAM:  *Provide aquatic HEP if joins at [facility]  Access Code: [email or web address] URL: [email or web address] Date: 07/05/2022 Prepared by: Riki Altes  Exercises - Seated Hamstring Stretch  - 2 x daily - 7 x weekly - 3 sets - 30seconds hold - Supine Posterior Pelvic Tilt  - 1-2 x daily - 7 x weekly - 2 sets - 10 reps - 5 seconds hold - Sidelying Hip Abduction  - 1-2 x daily - 7 x weekly - 2-3 sets - 10 reps - Standing Hip Flexor Stretch  - 1 x daily - 7 x weekly - 3 sets - 20econds hold - Sit to Stand Without Arm Support  - 1-2 x daily - 7 x weekly - 1-2 sets - 10 reps   ASSESSMENT:  CLINICAL IMPRESSION: The patient has made progress.  She has improved strength when compared to initial evaluation.  She has reached her Foto goal for function.  Her functional levels measured at 44%.  She is advised she still has room to improve.  She has improved 14% since initial eval.  She continues to have tenderness to palpation in her lateral hip and lower back.  Therapy reviewed self soft tissue mobilization techniques.  She appears to be hypersensitive in these areas.  Therapy reviewed techniques to decrease sensitivity of these areas.  We also showed her focus stretching for lateral hip tightness and pain.  She would benefit from further skilled therapy to improve motion and strength but she may take her HEP and continue to work on her own.  She plans at some point on getting a membership to the [facility] so that she continue with her pool therapy.  Will likely discharge to HEP at this time the patient was advised that she can schedule appointments as needed.  See below for goal specific progress   FROM EVAL: Patient is a 62 y.o. f who was seen today for physical therapy evaluation and treatment for SIJ disease and lumbosacral spondylosis. She has had chronic LBP for several years with surgical intervention >5 yrs ago. Pain appears to be multifactorial with SIJ  and as per MD "facet arthropathy mediated" pain.  She tests with only minor weakness in hips but is highly pain sensitive with movement having significant difficulty with functional mobility (STS and sup<>sit).  She will benefit from skilled physical therapy intervention.  Will plan to begin aquatic based as she will  benefit from the properties of water to reduce pain, improve movement and hip strength then will transition to land based for further progression.    OBJECTIVE IMPAIRMENTS: decreased activity tolerance, decreased mobility, difficulty walking, decreased strength, improper body mechanics, postural dysfunction, and pain.   ACTIVITY LIMITATIONS: carrying, lifting, bending, sitting, standing, squatting, stairs, transfers, and bed mobility  PARTICIPATION LIMITATIONS: meal prep, cleaning, shopping, community activity, and yard work  PERSONAL FACTORS: Fitness and Time since onset of injury/illness/exacerbation are also affecting patient's functional outcome.   REHAB POTENTIAL: Good  CLINICAL DECISION MAKING: Evolving/moderate complexity  EVALUATION COMPLEXITY: Moderate   GOALS: Goals reviewed with patient? Yes  SHORT TERM GOALS: Target date:   Pt will tolerate full aquatic sessions consistently without increase in pain and with improving function to demonstrate good toleration and effectiveness of intervention.   Baseline: Goal status: MET - 06/23/22  2.  Pt will tolerate STS submerged from bench onto blue step x10 consecutive reps without pain Baseline: unable on land Goal status: MET - 06/23/22  3.  Pt will report decrease in overall  pain to </= 6/10 for improved toleration to activity/quality of life and to demonstrate improved management of pain.  Baseline: 9-10/10 at eval:   Pain can still reach a 7 out of 10 521 Goal status:ONGOING   4.  Pt will tolerate stair climbing using alternating pattern ascending and descending 6 steps without use of handrail in aquatic  setting Baseline: step to and painful Goal status: Continues to have pain 5/21   LONG TERM GOALS: Target date: 07/17/22  Pt to meet stated Foto Goal of 43% Baseline: 30% Goal status: 44% goal met  2.  Pt will be indep with final HEP's (land and aquatic as appropriate) for continued management of condition  Baseline: none Goal status: Independent with aquatic HEP could use further visits for land HEP  3.  Pt will improve on 30s STS test to >or=  7  to demonstrate improving functional lower extremity strength, transitional movements, and balance  Baseline: 3 Goal status: Not tested  4.  Pt will improve on Tug test to <or= 10s to demonstrate improvement in lower extremity function, mobility and decreased fall risk.  Baseline: 17.36 at eval;  11.64 06/28/22 Goal status: Not tested  5.  Pt will report decrease in worst pain to </= 4/10 for improved toleration to activity/quality of life and to demonstrate improved management of pain.  Baseline: 9-10/10 at eval;  7/10 06/28/22 Goal status: ONGOING   6.  Pt will report improved toleration to activity on daily basis as prior to the onset of skilled physical therapy intervention. Baseline: minimal toleration to any activity Goal status: INITIAL  PLAN:  PT FREQUENCY: 1-2x/week  PT DURATION: 8 weeks  12 visit (allowing for scheduling conflicts)  PLANNED INTERVENTIONS: Therapeutic exercises, Therapeutic activity, Neuromuscular re-education, Balance training, Gait training, Patient/Family education, Self Care, Joint mobilization, Stair training, DME instructions, Aquatic Therapy, Dry Needling, Taping, Ionotophoresis 4mg /ml Dexamethasone, Manual therapy, and Re-evaluation.  PLAN FOR NEXT SESSION: lumbosacral mobility and strength; stretching, stair and transfer training, pain relief Land: body mechanics; sup<>sit/STS transfer, stair climbing; lumbosacral mobilization/strengthening; stretching program  Lorayne Bender PT DPT   07/18/22 8:32  AM [facility] [address] [place], [place], [address] Phone: [phone]   Fax:  [phone]

## 2022-07-18 ENCOUNTER — Encounter (HOSPITAL_BASED_OUTPATIENT_CLINIC_OR_DEPARTMENT_OTHER): Payer: Self-pay | Admitting: Physical Therapy

## 2022-07-20 ENCOUNTER — Other Ambulatory Visit: Payer: Self-pay

## 2022-07-20 ENCOUNTER — Other Ambulatory Visit: Payer: Self-pay | Admitting: Physical Medicine & Rehabilitation

## 2022-07-20 ENCOUNTER — Other Ambulatory Visit: Payer: Self-pay | Admitting: Internal Medicine

## 2022-07-21 ENCOUNTER — Other Ambulatory Visit: Payer: Self-pay

## 2022-07-21 ENCOUNTER — Telehealth: Payer: Self-pay | Admitting: Internal Medicine

## 2022-07-21 NOTE — Telephone Encounter (Signed)
Patient states she needs a refill on the following medications :  albuterol (VENTOLIN HFA) 108 (90 Base) MCG/ACT inhaler,   amLODipine (NORVASC) 5 MG tablet,   atorvastatin (LIPITOR) 10 MG tablet,   cetirizine (ZYRTEC) 10 MG tablet ,   losartan (COZAAR) 50 MG tablet ,   naloxone (NARCAN) nasal spray 4 mg/0.1 mL.   Patient has an appointment scheduled for 07/28/22.

## 2022-07-25 MED ORDER — ALBUTEROL SULFATE HFA 108 (90 BASE) MCG/ACT IN AERS: INHALATION_SPRAY | RESPIRATORY_TRACT | 0 refills | Status: DC

## 2022-07-25 MED ORDER — CETIRIZINE HCL 10 MG PO TABS: 10.0000 mg | ORAL_TABLET | Freq: Every day | ORAL | 0 refills | Status: DC

## 2022-07-25 MED ORDER — LOSARTAN POTASSIUM 50 MG PO TABS: 50.0000 mg | ORAL_TABLET | Freq: Every day | ORAL | 0 refills | Status: DC

## 2022-07-25 MED ORDER — AMLODIPINE BESYLATE 5 MG PO TABS: 5.0000 mg | ORAL_TABLET | Freq: Every day | ORAL | 0 refills | Status: DC

## 2022-07-25 MED ORDER — ATORVASTATIN CALCIUM 10 MG PO TABS: 10.0000 mg | ORAL_TABLET | Freq: Every day | ORAL | 0 refills | Status: DC

## 2022-07-25 NOTE — Telephone Encounter (Signed)
Sent 30 day supply to [facility]...[name] Walker

## 2022-07-28 ENCOUNTER — Encounter: Payer: Self-pay | Admitting: Internal Medicine

## 2022-07-28 ENCOUNTER — Ambulatory Visit (INDEPENDENT_AMBULATORY_CARE_PROVIDER_SITE_OTHER): Payer: Medicare HMO | Admitting: Internal Medicine

## 2022-07-28 ENCOUNTER — Other Ambulatory Visit: Payer: Self-pay | Admitting: Internal Medicine

## 2022-07-28 VITALS — BP 122/80 | HR 60 | Temp 98.2°F | Ht 62.0 in | Wt 143.0 lb

## 2022-07-28 DIAGNOSIS — E559 Vitamin D deficiency, unspecified: Secondary | ICD-10-CM

## 2022-07-28 DIAGNOSIS — E538 Deficiency of other specified B group vitamins: Secondary | ICD-10-CM | POA: Diagnosis not present

## 2022-07-28 DIAGNOSIS — Z0000 Encounter for general adult medical examination without abnormal findings: Secondary | ICD-10-CM | POA: Diagnosis not present

## 2022-07-28 DIAGNOSIS — W57XXXA Bitten or stung by nonvenomous insect and other nonvenomous arthropods, initial encounter: Secondary | ICD-10-CM

## 2022-07-28 DIAGNOSIS — F17200 Nicotine dependence, unspecified, uncomplicated: Secondary | ICD-10-CM | POA: Diagnosis not present

## 2022-07-28 DIAGNOSIS — I10 Essential (primary) hypertension: Secondary | ICD-10-CM

## 2022-07-28 DIAGNOSIS — Z0001 Encounter for general adult medical examination with abnormal findings: Secondary | ICD-10-CM

## 2022-07-28 DIAGNOSIS — R739 Hyperglycemia, unspecified: Secondary | ICD-10-CM

## 2022-07-28 DIAGNOSIS — L309 Dermatitis, unspecified: Secondary | ICD-10-CM

## 2022-07-28 DIAGNOSIS — E7849 Other hyperlipidemia: Secondary | ICD-10-CM

## 2022-07-28 DIAGNOSIS — R600 Localized edema: Secondary | ICD-10-CM

## 2022-07-28 LAB — CBC WITH DIFFERENTIAL/PLATELET
Basophils Absolute: 0 10*3/uL (ref 0.0–0.1)
Basophils Relative: 0.6 % (ref 0.0–3.0)
Eosinophils Absolute: 0 10*3/uL (ref 0.0–0.7)
Eosinophils Relative: 1 % (ref 0.0–5.0)
HCT: 46.4 % — ABNORMAL HIGH (ref 36.0–46.0)
Hemoglobin: 14.8 g/dL (ref 12.0–15.0)
Lymphocytes Relative: 42 % (ref 12.0–46.0)
Lymphs Abs: 2 10*3/uL (ref 0.7–4.0)
MCHC: 32 g/dL (ref 30.0–36.0)
MCV: 89.8 fl (ref 78.0–100.0)
Monocytes Absolute: 0.4 10*3/uL (ref 0.1–1.0)
Monocytes Relative: 9.2 % (ref 3.0–12.0)
Neutro Abs: 2.3 10*3/uL (ref 1.4–7.7)
Neutrophils Relative %: 47.2 % (ref 43.0–77.0)
Platelets: 126 10*3/uL — ABNORMAL LOW (ref 150.0–400.0)
RBC: 5.17 Mil/uL — ABNORMAL HIGH (ref 3.87–5.11)
RDW: 15.3 % (ref 11.5–15.5)
WBC: 4.8 10*3/uL (ref 4.0–10.5)

## 2022-07-28 LAB — BASIC METABOLIC PANEL
BUN: 12 mg/dL (ref 6–23)
CO2: 28 mEq/L (ref 19–32)
Calcium: 9.6 mg/dL (ref 8.4–10.5)
Chloride: 109 mEq/L (ref 96–112)
Creatinine, Ser: 0.64 mg/dL (ref 0.40–1.20)
GFR: 94.61 mL/min (ref >60.00)
Glucose, Bld: 97 mg/dL (ref 70–99)
Potassium: 4 mEq/L (ref 3.5–5.1)
Sodium: 141 mEq/L (ref 135–145)

## 2022-07-28 LAB — URINALYSIS, ROUTINE W REFLEX MICROSCOPIC
Bilirubin Urine: NEGATIVE
Hgb urine dipstick: NEGATIVE
Ketones, ur: NEGATIVE
Leukocytes,Ua: NEGATIVE
Nitrite: NEGATIVE
RBC / HPF: NONE SEEN (ref 0–?)
Specific Gravity, Urine: 1.01 (ref 1.000–1.030)
Total Protein, Urine: NEGATIVE
Urine Glucose: NEGATIVE
Urobilinogen, UA: 1 (ref 0.0–1.0)
WBC, UA: NONE SEEN (ref 0–?)
pH: 6.5 (ref 5.0–8.0)

## 2022-07-28 LAB — HEPATIC FUNCTION PANEL
ALT: 21 U/L (ref 0–35)
AST: 21 U/L (ref 0–37)
Albumin: 4.5 g/dL (ref 3.5–5.2)
Alkaline Phosphatase: 74 U/L (ref 39–117)
Bilirubin, Direct: 0.1 mg/dL (ref 0.0–0.3)
Total Bilirubin: 0.5 mg/dL (ref 0.2–1.2)
Total Protein: 7.3 g/dL (ref 6.0–8.3)

## 2022-07-28 LAB — LIPID PANEL
Cholesterol: 170 mg/dL (ref 0–200)
HDL: 40.7 mg/dL (ref >39.00)
NonHDL: 128.84
Total CHOL/HDL Ratio: 4
Triglycerides: 203 mg/dL — ABNORMAL HIGH (ref 0.0–149.0)
VLDL: 40.6 mg/dL — ABNORMAL HIGH (ref 0.0–40.0)

## 2022-07-28 LAB — VITAMIN B12: Vitamin B-12: 245 pg/mL (ref 211–911)

## 2022-07-28 LAB — VITAMIN D 25 HYDROXY (VIT D DEFICIENCY, FRACTURES): VITD: 40.95 ng/mL (ref 30.00–100.00)

## 2022-07-28 LAB — TSH: TSH: 0.43 u[IU]/mL (ref 0.35–5.50)

## 2022-07-28 LAB — LDL CHOLESTEROL, DIRECT: Direct LDL: 103 mg/dL

## 2022-07-28 LAB — HEMOGLOBIN A1C: Hgb A1c MFr Bld: 5 % (ref 4.6–6.5)

## 2022-07-28 MED ORDER — ATORVASTATIN CALCIUM 20 MG PO TABS: 20.0000 mg | ORAL_TABLET | Freq: Every day | ORAL | 3 refills | Status: AC

## 2022-07-28 MED ORDER — TRIAMCINOLONE ACETONIDE 0.1 % EX CREA: 1.0000 | TOPICAL_CREAM | Freq: Two times a day (BID) | CUTANEOUS | 0 refills | Status: AC

## 2022-07-28 NOTE — Progress Notes (Unsigned)
Patient ID: [name] Walker, female   DOB: 03/02/1960, 62 y.o.   MRN: [id number]         Chief Complaint:: wellness exam and medication renewal  (Got bite on Monday on left upper thigh )  ***       HPI:  [name] Walker is a 62 y.o. female here for wellness exam                        Also lives with mother with dementia.     Wt Readings from Last 3 Encounters:  07/28/22 143 lb (64.9 kg)  07/06/22 151 lb (68.5 kg)  05/19/22 151 lb 6.4 oz (68.7 kg)   BP Readings from Last 3 Encounters:  07/28/22 122/80  07/06/22 126/80  05/19/22 (!) 146/92   Immunization History  Administered Date(s) Administered   Influenza, Quadrivalent, Recombinant, Inj, Pf 12/31/2018   Influenza,inj,Quad PF,6+ Mos 03/02/2020   PFIZER(Purple Top)SARS-COV-2 Vaccination 04/26/2019, 05/17/2019   Pneumococcal Polysaccharide-23 01/20/2019   Tdap 11/02/2019   Zoster Recombinat (Shingrix) 01/20/2019, 03/22/2019   Health Maintenance Due  Topic Date Due   Medicare Annual Wellness (AWV)  Never done   PAP SMEAR-Modifier  10/02/2020   Colonoscopy  11/21/2022      Past Medical History:  Diagnosis Date   ACHILLES TENDINITIS    ALLERGIC RHINITIS    Allergy    Anginal pain (HCC) 04/04/2016   Pt currently feels like she is having muscle spasms and aching in her chest   Anxiety    Asthma    Chronic kidney disease    Depression    GERD    History of kidney stones    HYPERTENSION    LATERAL EPICONDYLITIS, RIGHT    LOW BACK PAIN    Plantar fascial fibromatosis    Stress    SUBACROMIAL BURSITIS, RIGHT    Thoracic scoliosis 10/01/2015   TIA (transient ischemic attack)    Past Surgical History:  Procedure Laterality Date   ANTERIOR CERVICAL DECOMP/DISCECTOMY FUSION N/A 04/07/2016   Procedure: Cervical two-three Anterior cervical decompression/discectomy/fusion;  Surgeon: Coletta Memos, MD;  Location:  Hospital OR;  Service: Neurosurgery;  Laterality: N/A;   BACK SURGERY  2015   lower back   EXTRACORPOREAL SHOCK WAVE  LITHOTRIPSY Left 09/28/2016   Procedure: LEFT EXTRACORPOREAL SHOCK WAVE LITHOTRIPSY (ESWL);  Surgeon: Jerilee Field, MD;  Location: [facility]S;  Service: Urology;  Laterality: Left;   EXTRACORPOREAL SHOCK WAVE LITHOTRIPSY Right 04/08/2018   Procedure: EXTRACORPOREAL SHOCK WAVE LITHOTRIPSY (ESWL);  Surgeon: Crist Fat, MD;  Location: [facility];  Service: Urology;  Laterality: Right;   KIDNEY SURGERY     LITHOTRIPSY  05/2016   TUBAL LIGATION      reports that she has been smoking cigarettes. She has a 10.00 pack-year smoking history. She has never used smokeless tobacco. She reports that she does not drink alcohol and does not use drugs. family history includes Breast cancer in her maternal aunt; Colon cancer in her father and sister; Diabetes in her mother; Prostate cancer in her father. Allergies  Allergen Reactions   Flexeril [Cyclobenzaprine] Swelling    Tongue swells   Lisinopril Other (See Comments)    ? Possible tongue swelling   Other reaction(s): Other (See Comments)  ? Possible tongue swelling  ? Possible tongue swelling , , ? Possible tongue swelling   Other Palpitations    ALL MUSCLE RELAXERS-PER PATIENT   Robaxin [Methocarbamol] Other (See Comments)    Tongue swelling  Contrast Media [Iodinated Contrast Media] Itching   Iodine Itching   Gabapentin Other (See Comments)   Pregabalin Anxiety and Swelling    Anxiety attacks   Current Outpatient Medications on File Prior to Visit  Medication Sig Dispense Refill   acetaminophen-codeine (TYLENOL #3) 300-30 MG tablet      acetaminophen-codeine (TYLENOL #4) 300-60 MG tablet TAKE 1 TABLET BY MOUTH TWICE DAILY AS NEEDED FOR MODERATE PAIN 60 tablet 0   acetic acid-hydrocortisone (VOSOL-HC) OTIC solution Place 3 drops into the left ear 3 (three) times daily. 10 mL 1   albuterol (VENTOLIN HFA) 108 (90 Base) MCG/ACT inhaler INHALE 2 PUFFS BY MOUTH EVERY 6 HOURS AS NEEDED FOR WHEEZING FOR SHORTNESS OF BREATH 18 g 0   alendronate  (FOSAMAX) 70 MG tablet Take 1 tablet (70 mg total) by mouth once a week. 12 tablet 3   amLODipine (NORVASC) 5 MG tablet Take 1 tablet (5 mg total) by mouth daily. Must keep appt for future appt 30 tablet 0   aspirin EC 81 MG tablet Take 1 tablet (81 mg total) by mouth daily. Swallow whole. 30 tablet 11   atorvastatin (LIPITOR) 10 MG tablet Take 1 tablet (10 mg total) by mouth daily. Must keep appt for future appt 30 tablet 0   benzonatate (TESSALON PERLES) 100 MG capsule 1-2 tabs mouth every 8 hrs as needed 60 capsule 2   celecoxib (CELEBREX) 200 MG capsule Take 1 capsule (200 mg total) by mouth 2 (two) times daily. 60 capsule 11   cetirizine (ZYRTEC) 10 MG tablet Take 1 tablet (10 mg total) by mouth daily. Must keep appt for future appt 30 tablet 0   Cholecalciferol (THERA-D 2000) 50 MCG (2000 UT) TABS 1 tab by mouth once daily 30 tablet 99   citalopram (CELEXA) 40 MG tablet Take 1 tablet (40 mg total) by mouth daily. 90 tablet 3   glycopyrrolate (ROBINUL) 2 MG tablet Take 1 tablet (2 mg total) by mouth 2 (two) times daily. 60 tablet 11   lidocaine (LIDODERM) 5 % Place 1 patch onto the skin daily. Remove & Discard patch within 12 hours or as directed by MD 30 patch 2   lisinopril (ZESTRIL) 10 MG tablet Take by mouth.     LORazepam (ATIVAN) 1 MG tablet TAKE 1/2 (ONE-HALF) TABLET BY MOUTH TWICE DAILY AS NEEDED FOR ANXIETY 30 tablet 2   losartan (COZAAR) 50 MG tablet Take 1 tablet (50 mg total) by mouth daily. Must keep appt for future appt 30 tablet 0   naloxone (NARCAN) nasal spray 4 mg/0.1 mL      ondansetron (ZOFRAN) 8 MG tablet Take 1 tablet (8 mg total) by mouth every 8 (eight) hours as needed for nausea or vomiting. 20 tablet 0   pantoprazole (PROTONIX) 40 MG tablet Take 1 tablet (40 mg total) by mouth 2 (two) times daily before a meal. 180 tablet 3   scopolamine (TRANSDERM SCOP, 1.5 MG,) 1 MG/3DAYS Place 1 patch (1.5 mg total) onto the skin every 3 (three) days. 10 patch 0   traZODone  (DESYREL) 100 MG tablet Take 1.5 tablets (150 mg total) by mouth at bedtime as needed for sleep. 135 tablet 1   traZODone (DESYREL) 50 MG tablet Take by mouth.     No current facility-administered medications on file prior to visit.        ROS:  All others reviewed and negative.  Objective        PE:  BP 122/80 (BP Location: Left  Arm, Patient Position: Sitting, Cuff Size: Normal)   Pulse 60   Temp 98.2 F (36.8 C) (Oral)   Ht 5\' 2"  (1.575 m)   Wt 143 lb (64.9 kg)   LMP 08/22/2010   SpO2 99%   BMI 26.16 kg/m                 Constitutional: Pt appears in NAD               HENT: Head: NCAT.                Right Ear: External ear normal.                 Left Ear: External ear normal.                Eyes: . Pupils are equal, round, and reactive to light. Conjunctivae and EOM are normal               Nose: without d/c or deformity               Neck: Neck supple. Gross normal ROM               Cardiovascular: Normal rate and regular rhythm.                 Pulmonary/Chest: Effort normal and breath sounds without rales or wheezing.                Abd:  Soft, NT, ND, + BS, no organomegaly               Neurological: Pt is alert. At baseline orientation, motor grossly intact               Skin: Skin is warm. No rashes, no other new lesions, LE edema - ***               Psychiatric: Pt behavior is normal without agitation   Micro: none  Cardiac tracings I have personally interpreted today:  none  Pertinent Radiological findings (summarize): none   Lab Results  Component Value Date   WBC 5.1 08/18/2021   HGB 14.6 08/18/2021   HCT 44.9 08/18/2021   PLT 125.0 (L) 08/18/2021   GLUCOSE 101 (H) 08/18/2021   CHOL 154 08/18/2021   TRIG 250.0 (H) 08/18/2021   HDL 36.10 (L) 08/18/2021   LDLDIRECT 82.0 08/18/2021   LDLCALC 46 06/30/2020   ALT 21 08/18/2021   AST 20 08/18/2021   NA 142 08/18/2021   K 3.3 (L) 08/18/2021   CL 108 08/18/2021   CREATININE 0.67 08/18/2021   BUN 12  08/18/2021   CO2 29 08/18/2021   TSH 0.63 08/18/2021   INR 0.9 09/30/2019   HGBA1C 5.3 08/18/2021   Assessment/Plan:  KEEANNA FREISE is a 62 y.o. Black or African American [2] female with  has a past medical history of ACHILLES TENDINITIS, ALLERGIC RHINITIS, Allergy, Anginal pain (HCC) (04/04/2016), Anxiety, Asthma, Chronic kidney disease, Depression, GERD, History of kidney stones, HYPERTENSION, LATERAL EPICONDYLITIS, RIGHT, LOW BACK PAIN, Plantar fascial fibromatosis, Stress, SUBACROMIAL BURSITIS, RIGHT, Thoracic scoliosis (10/01/2015), and TIA (transient ischemic attack).  No problem-specific Assessment & Plan notes found for this encounter.  Followup: No follow-ups on file.  Oliver Barre, MD 07/28/2022 10:25 AM [facility]

## 2022-07-28 NOTE — Patient Instructions (Signed)
Please take all new medication as prescribed - the steroid cream for the bite, as well as the eczema  Please consider wearing compression stockings to the legs to the knees to help with the swelling  Please continue all other medications as before, and refills have been done if requested.  Please have the pharmacy call with any other refills you may need.  Please continue your efforts at being more active, low cholesterol diet, and weight control.  You are otherwise up to date with prevention measures today.  Please keep your appointments with your specialists as you may have planned  Please go to the LAB at the blood drawing area for the tests to be done  You will be contacted by phone if any changes need to be made immediately.  Otherwise, you will receive a letter about your results with an explanation, but please check with MyChart first.  Please remember to sign up for MyChart if you have not done so, as this will be important to you in the future with finding out test results, communicating by private email, and scheduling acute appointments online when needed.  Please make an Appointment to return in 6 months, or sooner if needed, also with Lab Appointment for testing done 3-5 days before at the FIRST FLOOR Lab (so this is for TWO appointments - please see the scheduling desk as you leave)

## 2022-07-29 DIAGNOSIS — W57XXXA Bitten or stung by nonvenomous insect and other nonvenomous arthropods, initial encounter: Secondary | ICD-10-CM | POA: Insufficient documentation

## 2022-07-29 NOTE — Assessment & Plan Note (Signed)
Age and sex appropriate education and counseling updated with regular exercise and diet Referrals for preventative services - for colonsocopy and mammogram soon Immunizations addressed - none needed Smoking counseling  - still smoking, not ready to quit Evidence for depression or other mood disorder - none significant Most recent labs reviewed. I have personally reviewed and have noted: 1) the patient's medical and social history 2) The patient's current medications and supplements 3) The patient's height, weight, and BMI have been recorded in the chart

## 2022-07-29 NOTE — Assessment & Plan Note (Signed)
Lab Results  Component Value Date   LDLCALC 46 06/30/2020   Stable, pt to continue current statin lipior 20 qd

## 2022-07-29 NOTE — Assessment & Plan Note (Signed)
Mild, for triam cr prn,  to f/u any worsening symptoms or concerns 

## 2022-07-29 NOTE — Assessment & Plan Note (Signed)
BP Readings from Last 3 Encounters:  07/28/22 122/80  07/06/22 126/80  05/19/22 (!) 146/92   Stable, pt to continue medical treatment norvasc 5 qd, losartan 50 qd

## 2022-07-29 NOTE — Assessment & Plan Note (Signed)
Last vitamin D Lab Results  Component Value Date   VD25OH 40.95 07/28/2022   Stable, cont oral replacement

## 2022-07-29 NOTE — Assessment & Plan Note (Signed)
Pt counsled to quit, pt not ready °

## 2022-07-29 NOTE — Assessment & Plan Note (Signed)
Lab Results  Component Value Date   HGBA1C 5.0 07/28/2022   Stable, pt to continue current medical treatment  - diet, wt control

## 2022-07-29 NOTE — Assessment & Plan Note (Signed)
C/w venous insufficiency - for compression stockings

## 2022-07-29 NOTE — Assessment & Plan Note (Signed)
With mild recent worsening, for triam cr prn

## 2022-08-03 ENCOUNTER — Telehealth: Payer: Self-pay | Admitting: *Deleted

## 2022-08-03 NOTE — Progress Notes (Signed)
   Subjective:    Patient ID: [name] Walker, female    DOB: 1960-10-22, 62 y.o.   MRN: [id number]  HPI   PROCEDURE RECORD [facility]   Name: [name] Walker DOB:[birth year] MRN: [id number]  Date:08/03/2022  Physician: Claudette Laws, MD    Nurse/CMA: Charise Carwin MA  Allergies:  Allergies  Allergen Reactions   Flexeril [Cyclobenzaprine] Swelling    Tongue swells   Lisinopril Other (See Comments)    ? Possible tongue swelling   Other reaction(s): Other (See Comments)  ? Possible tongue swelling  ? Possible tongue swelling , , ? Possible tongue swelling   Other Palpitations    ALL MUSCLE RELAXERS-PER PATIENT   Robaxin [Methocarbamol] Other (See Comments)    Tongue swelling   Contrast Media [Iodinated Contrast Media] Itching   Iodine Itching   Gabapentin Other (See Comments)   Pregabalin Anxiety and Swelling    Anxiety attacks    Consent Signed: Yes.    Is patient diabetic? No.  CBG today? N/a  Pregnant: No. LMP: Patient's last menstrual period was 08/22/2010. (age 46-55)  Anticoagulants: no Anti-inflammatory: no Antibiotics: no  Procedure: Sacroiliac Steroid Injection  Position: Prone Start Time: 11:10 am  End Time: 11:15 am  Fluoro Time: 11  RN/CMA Cason Luffman MA Fatuma Dowers MA    Time 11:00 11:19 am    BP 123/85 129/93    Pulse 76 75    Respirations 16 16    O2 Sat 97 99    S/S 6 6    Pain Level 8/10 0/10     D/C home with a Friend, patient A & O X 3, D/C instructions reviewed, and sits independently.        Review of Systems     Objective:   Physical Exam        Assessment & Plan:

## 2022-08-03 NOTE — Telephone Encounter (Signed)
Urine drug screen for this encounter is consistent for prescribed medication 

## 2022-08-04 ENCOUNTER — Encounter: Payer: Medicare HMO | Attending: Physical Medicine and Rehabilitation | Admitting: Physical Medicine & Rehabilitation

## 2022-08-04 ENCOUNTER — Encounter: Payer: Self-pay | Admitting: Physical Medicine & Rehabilitation

## 2022-08-04 VITALS — BP 123/85 | HR 71 | Temp 98.4°F | Ht 62.0 in | Wt 141.0 lb

## 2022-08-04 DIAGNOSIS — M533 Sacrococcygeal disorders, not elsewhere classified: Secondary | ICD-10-CM | POA: Diagnosis not present

## 2022-08-04 DIAGNOSIS — Z5181 Encounter for therapeutic drug level monitoring: Secondary | ICD-10-CM

## 2022-08-04 MED ORDER — ACETAMINOPHEN-CODEINE 300-60 MG PO TABS: 1.0000 | ORAL_TABLET | Freq: Two times a day (BID) | ORAL | 2 refills | Status: DC | PRN

## 2022-08-04 MED ORDER — LIDOCAINE HCL 1 % IJ SOLN
5.0000 mL | Freq: Once | INTRAMUSCULAR | Status: AC
Start: 2022-08-04 — End: 2022-08-04
  Administered 2022-08-04: 5 mL

## 2022-08-04 MED ORDER — LIDOCAINE HCL (PF) 2 % IJ SOLN
1.0000 mL | Freq: Once | INTRAMUSCULAR | Status: AC
Start: 2022-08-04 — End: 2022-08-04
  Administered 2022-08-04: 1 mL

## 2022-08-04 MED ORDER — BETAMETHASONE SOD PHOS & ACET 6 (3-3) MG/ML IJ SUSP
3.0000 mg | Freq: Once | INTRAMUSCULAR | Status: AC
Start: 2022-08-04 — End: 2022-08-04
  Administered 2022-08-04: 3 mg via INTRAMUSCULAR

## 2022-08-04 MED ORDER — IOHEXOL 180 MG/ML  SOLN
1.0000 mL | Freq: Once | INTRAMUSCULAR | Status: AC
  Administered 2022-08-04: 1 mL

## 2022-08-04 NOTE — Addendum Note (Signed)
Addended by: Erick Colace on: 08/04/2022 02:28 PM   Modules accepted: Orders

## 2022-08-04 NOTE — Patient Instructions (Signed)
Sacroiliac injection was performed today. A combination of numbing medicine (lidocaine) plus a cortisone medicine (betamethasone) was injected. The injection was done under x-ray guidance. This procedure has been performed to help reduce low back and buttocks pain as well as potentially hip pain. The duration of this injection is variable lasting from hours to  Months. It may repeated if needed. 

## 2022-08-04 NOTE — Progress Notes (Signed)

## 2022-08-04 NOTE — Addendum Note (Signed)
Addended by: Becky Sax on: 08/04/2022 12:59 PM   Modules accepted: Orders

## 2022-09-04 ENCOUNTER — Other Ambulatory Visit: Payer: Self-pay | Admitting: Internal Medicine

## 2022-09-15 ENCOUNTER — Telehealth: Payer: Self-pay

## 2022-09-15 ENCOUNTER — Other Ambulatory Visit (HOSPITAL_COMMUNITY): Payer: Self-pay

## 2022-09-15 NOTE — Telephone Encounter (Signed)
Pharmacy Patient Advocate Encounter   Received notification from  [facility]  that prior authorization for Lidocaine 5% patches is required/requested.   Insurance verification completed.   The patient is insured through  [employer] Part [employer]  .   Per test claim: PA submitted to [employer] via CoverMyMeds Key/confirmation #/EOC [id number] Status is pending

## 2022-09-18 ENCOUNTER — Other Ambulatory Visit: Payer: Self-pay | Admitting: Internal Medicine

## 2022-09-18 NOTE — Telephone Encounter (Signed)
Pharmacy Patient Advocate Encounter  Received notification from [facility] that Prior Authorization for Lidocaine 5% patches has been : The information provided by your prescriber did not  meet the requirements for covering this medication (prior authorization).Your plan does not allow coverage of this medication based on your prescriber answering No to the following question(s): Is the requested drug being prescribed for any of the following: A) pain associated with post-herpetic neuralgia, B) pain associated with diabetic neuropathy, C) pain associated with cancer-related neuropathy (including treatment-related neuropathy [e.g., neuropathy associated with radiation treatment or  chemotherapy])?  .  PA #/Case ID/Reference #: [id number]   Please be advised we currently do not have a Pharmacist to review denials, therefore you will need to process appeals accordingly as needed. Thanks for your support at this time. Contact for appeals are as follows: Phone: [phone], Fax: [phone]  Denial letter indexed to media tab

## 2022-09-22 ENCOUNTER — Other Ambulatory Visit: Payer: Self-pay | Admitting: Internal Medicine

## 2022-09-22 ENCOUNTER — Other Ambulatory Visit: Payer: Self-pay

## 2022-09-26 ENCOUNTER — Ambulatory Visit (INDEPENDENT_AMBULATORY_CARE_PROVIDER_SITE_OTHER): Payer: Medicare HMO | Admitting: Radiology

## 2022-09-26 VITALS — BP 124/82

## 2022-09-26 DIAGNOSIS — R300 Dysuria: Secondary | ICD-10-CM

## 2022-09-26 LAB — URINALYSIS, COMPLETE W/RFL CULTURE
Bilirubin Urine: NEGATIVE
Casts: NONE SEEN /LPF
Glucose, UA: NEGATIVE
Ketones, ur: NEGATIVE
Nitrites, Initial: NEGATIVE
Specific Gravity, Urine: 1.02 (ref 1.001–1.035)
Yeast: NONE SEEN /HPF
pH: 5.5 (ref 5.0–8.0)

## 2022-09-26 LAB — CULTURE INDICATED

## 2022-09-26 MED ORDER — NITROFURANTOIN MONOHYD MACRO 100 MG PO CAPS
100.0000 mg | ORAL_CAPSULE | Freq: Two times a day (BID) | ORAL | 0 refills | Status: DC
Start: 2022-09-26 — End: 2022-11-09

## 2022-09-26 NOTE — Progress Notes (Signed)
SUBJECTIVE: [name] Walker is a 62 y.o. female who complains of dysuria, frequency, urgency, right lower back pain, hematuria, denies fevers. Hx kidney stones. Symptoms began 6 days ago.   Allergies  Allergen Reactions   Flexeril [Cyclobenzaprine] Swelling    Tongue swells   Lisinopril Other (See Comments)    ? Possible tongue swelling   Other reaction(s): Other (See Comments)  ? Possible tongue swelling  ? Possible tongue swelling , , ? Possible tongue swelling   Other Palpitations    ALL MUSCLE RELAXERS-PER PATIENT   Robaxin [Methocarbamol] Other (See Comments)    Tongue swelling   Contrast Media [Iodinated Contrast Media] Itching   Iodine Itching   Gabapentin Other (See Comments)   Pregabalin Anxiety and Swelling    Anxiety attacks    Current Outpatient Medications on File Prior to Visit  Medication Sig Dispense Refill   acetaminophen-codeine (TYLENOL #4) 300-60 MG tablet Take 1 tablet by mouth 2 (two) times daily as needed for moderate pain or severe pain. 60 tablet 2   albuterol (VENTOLIN HFA) 108 (90 Base) MCG/ACT inhaler INHALE 2 PUFFS BY MOUTH EVERY 6 HOURS AS NEEDED FOR WHEEZING FOR SHORTNESS OF BREATH 18 g 0   alendronate (FOSAMAX) 70 MG tablet Take 1 tablet (70 mg total) by mouth once a week. 12 tablet 3   amLODipine (NORVASC) 5 MG tablet Take 1 tablet (5 mg total) by mouth daily. 90 tablet 2   aspirin EC 81 MG tablet Take 1 tablet (81 mg total) by mouth daily. Swallow whole. 30 tablet 11   atorvastatin (LIPITOR) 20 MG tablet Take 1 tablet (20 mg total) by mouth daily. 90 tablet 3   cetirizine (ZYRTEC) 10 MG tablet TAKE 1 TABLET BY MOUTH ONCE DAILY *MUST  KEEP  FUTURE  APPOINTMENT* 30 tablet 0   Cholecalciferol (THERA-D 2000) 50 MCG (2000 UT) TABS 1 tab by mouth once daily 30 tablet 99   citalopram (CELEXA) 40 MG tablet Take 1 tablet by mouth once daily 90 tablet 2   lidocaine (LIDODERM) 5 % Place 1 patch onto the skin daily. Remove & Discard patch within 12  hours or as directed by MD 30 patch 2   LORazepam (ATIVAN) 1 MG tablet TAKE 1/2 (ONE-HALF) TABLET BY MOUTH TWICE DAILY AS NEEDED FOR ANXIETY 30 tablet 2   losartan (COZAAR) 50 MG tablet Take 1 tablet (50 mg total) by mouth daily. Must keep appt for future appt 30 tablet 0   naloxone (NARCAN) nasal spray 4 mg/0.1 mL      ondansetron (ZOFRAN) 8 MG tablet Take 1 tablet (8 mg total) by mouth every 8 (eight) hours as needed for nausea or vomiting. 20 tablet 0   pantoprazole (PROTONIX) 40 MG tablet Take 1 tablet (40 mg total) by mouth 2 (two) times daily before a meal. 180 tablet 3   traZODone (DESYREL) 100 MG tablet Take 1.5 tablets (150 mg total) by mouth at bedtime as needed for sleep. 135 tablet 1   traZODone (DESYREL) 50 MG tablet Take by mouth.     triamcinolone cream (KENALOG) 0.1 % Apply 1 Application topically 2 (two) times daily. 30 g 0   benzonatate (TESSALON PERLES) 100 MG capsule 1-2 tabs mouth every 8 hrs as needed (Patient not taking: Reported on 09/26/2022) 60 capsule 2   celecoxib (CELEBREX) 200 MG capsule Take 1 capsule (200 mg total) by mouth 2 (two) times daily. (Patient not taking: Reported on 09/26/2022) 60 capsule 11  glycopyrrolate (ROBINUL) 2 MG tablet Take 1 tablet (2 mg total) by mouth 2 (two) times daily. (Patient not taking: Reported on 09/26/2022) 60 tablet 11   scopolamine (TRANSDERM SCOP, 1.5 MG,) 1 MG/3DAYS Place 1 patch (1.5 mg total) onto the skin every 3 (three) days. (Patient not taking: Reported on 09/26/2022) 10 patch 0   No current facility-administered medications on file prior to visit.    Past Medical History:  Diagnosis Date   ACHILLES TENDINITIS    ALLERGIC RHINITIS    Allergy    Anginal pain (HCC) 04/04/2016   Pt currently feels like she is having muscle spasms and aching in her chest   Anxiety    Asthma    Chronic kidney disease    Depression    GERD    History of kidney stones    HYPERTENSION    LATERAL EPICONDYLITIS, RIGHT    LOW BACK PAIN     Plantar fascial fibromatosis    Stress    SUBACROMIAL BURSITIS, RIGHT    Thoracic scoliosis 10/01/2015   TIA (transient ischemic attack)      OBJECTIVE: Appears well, in no apparent distress.  Vital signs are normal. The abdomen is soft without tenderness, guarding, mass, rebound or organomegaly. No CVA tenderness or inguinal adenopathy noted.  Urine dipstick shows positive for WBC's, positive for RBC's, and positive for leukocytes.  Micro exam: 20-40 WBC's per HPF, 10-20 RBC's per HPF, and few+ bacteria.    Blood pressure 124/82, last menstrual period 08/22/2010.    Chaperone offered and declined for exam.  ASSESSMENT/PLAN: 1. Dysuria - Urinalysis,Complete w/RFL Culture - nitrofurantoin, macrocrystal-monohydrate, (MACROBID) 100 MG capsule; Take 1 capsule (100 mg total) by mouth 2 (two) times daily.  Dispense: 14 capsule; Refill: 0   Will send urine culture and sensitivity.  Treatment per orders - also push fluids, avoid bladder irritants. Instructed she may use Pyridium OTC prn. Call or return to clinic prn if these symptoms worsen or fail to improve as anticipated. Pyelo precautions reviewed with patient.   Arlie Solomons B WHNP-BC 1:51 PM 09/26/2022

## 2022-09-27 ENCOUNTER — Other Ambulatory Visit: Payer: Self-pay

## 2022-09-27 MED ORDER — CETIRIZINE HCL 10 MG PO TABS: 10.0000 mg | ORAL_TABLET | Freq: Every day | ORAL | 1 refills | Status: DC

## 2022-09-29 ENCOUNTER — Other Ambulatory Visit: Payer: Self-pay

## 2022-09-29 DIAGNOSIS — N390 Urinary tract infection, site not specified: Secondary | ICD-10-CM

## 2022-09-29 MED ORDER — SULFAMETHOXAZOLE-TRIMETHOPRIM 800-160 MG PO TABS
1.0000 | ORAL_TABLET | Freq: Two times a day (BID) | ORAL | 0 refills | Status: AC
Start: 2022-09-29 — End: 2022-10-06

## 2022-10-03 ENCOUNTER — Encounter: Payer: Medicare HMO | Admitting: Physical Medicine & Rehabilitation

## 2022-10-18 DIAGNOSIS — N3001 Acute cystitis with hematuria: Secondary | ICD-10-CM | POA: Diagnosis not present

## 2022-10-18 DIAGNOSIS — N200 Calculus of kidney: Secondary | ICD-10-CM | POA: Diagnosis not present

## 2022-10-20 ENCOUNTER — Encounter: Payer: Medicare HMO | Attending: Physical Medicine and Rehabilitation | Admitting: Physical Medicine & Rehabilitation

## 2022-10-20 ENCOUNTER — Encounter: Payer: Self-pay | Admitting: Physical Medicine & Rehabilitation

## 2022-10-20 VITALS — BP 127/84 | HR 66 | Ht 62.0 in | Wt 144.6 lb

## 2022-10-20 DIAGNOSIS — M961 Postlaminectomy syndrome, not elsewhere classified: Secondary | ICD-10-CM | POA: Insufficient documentation
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
## 2022-11-08 DIAGNOSIS — N200 Calculus of kidney: Secondary | ICD-10-CM | POA: Diagnosis not present

## 2022-11-08 DIAGNOSIS — R310 Gross hematuria: Secondary | ICD-10-CM | POA: Diagnosis not present

## 2022-11-09 ENCOUNTER — Other Ambulatory Visit: Payer: Self-pay | Admitting: Urology

## 2022-11-09 DIAGNOSIS — R310 Gross hematuria: Secondary | ICD-10-CM | POA: Diagnosis not present

## 2022-11-10 NOTE — Progress Notes (Signed)
Sent message, via epic in basket, requesting orders in epic from surgeon.  

## 2022-11-13 ENCOUNTER — Telehealth: Payer: Self-pay | Admitting: Internal Medicine

## 2022-11-13 MED ORDER — LORAZEPAM 1 MG PO TABS: 1.0000 mg | ORAL_TABLET | Freq: Two times a day (BID) | ORAL | 2 refills | Status: DC

## 2022-11-13 NOTE — Progress Notes (Signed)
COVID Vaccine received:  []  No [x]  Yes Date of any COVID positive Test in last 90 days: no PCP - Oliver Barre MD Cardiologist -   Chest x-ray -  EKG -  11/14/22 Epic Stress Test - no ECHO -10/02/19 Epic  Cardiac Cath - no  Bowel Prep - [x]  No  []   Yes ______  Pacemaker / ICD device [x]  No []  Yes   Spinal Cord Stimulator:[x]  No []  Yes       History of Sleep Apnea? [x]  No []  Yes   CPAP used?- [x]  No []  Yes    Does the patient monitor blood sugar?          [x]  No []  Yes  []  N/A  Patient has: [x]  NO Hx DM   []  Pre-DM                 []  DM1  []   DM2 Does patient have a [employer] or Dexacom? []  No []  Yes   Fasting Blood Sugar Ranges-  Checks Blood Sugar _____ times a day  GLP1 agonist / usual dose - no GLP1 instructions:  SGLT-2 inhibitors / usual dose - no SGLT-2 instructions:   Blood Thinner / Instructions:no Aspirin Instructions:ASA 81mg  Will stop 2 days prior to surgery per per pt. per MD office  Comments:   Activity level: Patient is unable to climb a flight of stairs without difficulty; [x]  No CP  [x]  No SOB, but would have __back pain_   Patient can perform ADLs without assistance.   Anesthesia review:   Patient denies shortness of breath, fever, cough and chest pain at PAT appointment.  Patient verbalized understanding and agreement to the Pre-Surgical Instructions that were given to them at this PAT appointment. Patient was also educated of the need to review these PAT instructions again prior to his/her surgery.I reviewed the appropriate phone numbers to call if they have any and questions or concerns.

## 2022-11-13 NOTE — Telephone Encounter (Signed)
Patient is taking her LORazepam (ATIVAN) 1 MG tablet twice a day at one full pill both times. The prescription says take 1/2 a pills twice a day. Patient would like to know if that can be updated. Please advise. Best callback is [phone].

## 2022-11-13 NOTE — Patient Instructions (Signed)
SURGICAL WAITING ROOM VISITATION  Patients having surgery or a procedure may have no more than 2 support people in the waiting area - these visitors may rotate.    Children under the age of 35 must have an adult with them who is not the patient.  Due to an increase in RSV and influenza rates and associated hospitalizations, children ages 52 and under may not visit patients in [facility] hospitals.  If the patient needs to stay at the hospital during part of their recovery, the visitor guidelines for inpatient rooms apply. Pre-op nurse will coordinate an appropriate time for 1 support person to accompany patient in pre-op.  This support person may not rotate.    Please refer to the [facility] website for the visitor guidelines for Inpatients (after your surgery is over and you are in a regular room).       Your procedure is scheduled on: 11/21/22   Report to [facility] Main Entrance    Report to admitting at  12:15 PM   Call this number if you have problems the morning of surgery [phone]   Do not eat food or drink liquids:After Midnight.      Oral Hygiene is also important to reduce your risk of infection.                                    Remember - BRUSH YOUR TEETH THE MORNING OF SURGERY WITH YOUR REGULAR TOOTHPASTE  DENTURES WILL BE REMOVED PRIOR TO SURGERY PLEASE DO NOT APPLY "Poly grip" OR ADHESIVES!!!   Do NOT smoke after Midnight   Stop all vitamins and herbal supplements 7 days before surgery.   Take these medicines the morning of surgery with A SIP OF WATER: Amlodipine, Atrovastatin, Cetirizine, Citaprolam, Pantoprazole, Tylenol with codiene if needed             You may not have any metal on your body including hair pins, jewelry, and body piercing             Do not wear make-up, lotions, powders, perfumes/cologne, or deodorant  Do not wear nail polish including gel and S&S, artificial/acrylic nails, or any other type of covering on natural nails  including finger and toenails. If you have artificial nails, gel coating, etc. that needs to be removed by a nail salon please have this removed prior to surgery or surgery may need to be canceled/ delayed if the surgeon/ anesthesia feels like they are unable to be safely monitored.   Do not shave  48 hours prior to surgery.    Do not bring valuables to the hospital. [place] IS NOT             RESPONSIBLE   FOR VALUABLES.   Contacts, glasses, dentures or bridgework may not be worn into surgery.  DO NOT BRING YOUR HOME MEDICATIONS TO THE HOSPITAL. PHARMACY WILL DISPENSE MEDICATIONS LISTED ON YOUR MEDICATION LIST TO YOU DURING YOUR ADMISSION IN THE HOSPITAL!    Patients discharged on the day of surgery will not be allowed to drive home.  Someone NEEDS to stay with you for the first 24 hours after anesthesia.   Special Instructions: Bring a copy of your healthcare power of attorney and living will documents the day of surgery if you haven't scanned them before.              Please read over  the following fact sheets you were given: IF YOU HAVE QUESTIONS ABOUT YOUR PRE-OP INSTRUCTIONS PLEASE CALL [phone] [name]   If you received a COVID test during your pre-op visit  it is requested that you wear a mask when out in public, stay away from anyone that may not be feeling well and notify your surgeon if you develop symptoms. If you test positive for Covid or have been in contact with anyone that has tested positive in the last 10 days please notify you surgeon.    [facility] [facility] - Preparing for Surgery Before surgery, you can play an important role.  Because skin is not sterile, your skin needs to be as free of germs as possible.  You can reduce the number of germs on your skin by washing with CHG (chlorahexidine gluconate) soap before surgery.  CHG is an antiseptic cleaner which kills germs and bonds with the skin to continue killing germs even after washing. Please DO NOT use if you have an  allergy to CHG or antibacterial soaps.  If your skin becomes reddened/irritated stop using the CHG and inform your nurse when you arrive at Short Stay. Do not shave (including legs and underarms) for at least 48 hours prior to the first CHG shower.  You may shave your face/neck.  Please follow these instructions carefully:  1.  Shower with CHG Soap the night before surgery and the  morning of surgery.  2.  If you choose to wash your hair, wash your hair first as usual with your normal  shampoo.  3.  After you shampoo, rinse your hair and body thoroughly to remove the shampoo.                             4.  Use CHG as you would any other liquid soap.  You can apply chg directly to the skin and wash.  Gently with a scrungie or clean washcloth.  5.  Apply the CHG Soap to your body ONLY FROM THE NECK DOWN.   Do   not use on face/ open                           Wound or open sores. Avoid contact with eyes, ears mouth and   genitals (private parts).                       Wash face,  Genitals (private parts) with your normal soap.             6.  Wash thoroughly, paying special attention to the area where your    surgery  will be performed.  7.  Thoroughly rinse your body with warm water from the neck down.  8.  DO NOT shower/wash with your normal soap after using and rinsing off the CHG Soap.                9.  Pat yourself dry with a clean towel.            10.  Wear clean pajamas.            11.  Place clean sheets on your bed the night of your first shower and do not  sleep with pets. Day of Surgery : Do not apply any lotions/deodorants the morning of surgery.  Please wear clean clothes to the hospital/surgery center.  FAILURE TO  FOLLOW THESE INSTRUCTIONS MAY RESULT IN THE CANCELLATION OF YOUR SURGERY  PATIENT SIGNATURE_________________________________  NURSE SIGNATURE__________________________________  ________________________________________________________________________

## 2022-11-13 NOTE — Telephone Encounter (Signed)
Ok done erx

## 2022-11-14 ENCOUNTER — Encounter (HOSPITAL_COMMUNITY): Payer: Self-pay

## 2022-11-14 ENCOUNTER — Other Ambulatory Visit: Payer: Self-pay

## 2022-11-14 ENCOUNTER — Encounter (HOSPITAL_COMMUNITY)
Admission: RE | Admit: 2022-11-14 | Discharge: 2022-11-14 | Disposition: A | Discharge status: Home / Self Care | Payer: Medicare HMO | Source: Ambulatory Visit | Attending: Urology

## 2022-11-14 VITALS — BP 141/90 | HR 67 | Temp 98.5°F | Resp 16 | Ht 62.0 in

## 2022-11-14 DIAGNOSIS — R9431 Abnormal electrocardiogram [ECG] [EKG]: Secondary | ICD-10-CM | POA: Insufficient documentation

## 2022-11-14 DIAGNOSIS — I10 Essential (primary) hypertension: Secondary | ICD-10-CM | POA: Diagnosis not present

## 2022-11-14 DIAGNOSIS — Z01812 Encounter for preprocedural laboratory examination: Secondary | ICD-10-CM | POA: Insufficient documentation

## 2022-11-14 DIAGNOSIS — Z01810 Encounter for preprocedural cardiovascular examination: Secondary | ICD-10-CM | POA: Insufficient documentation

## 2022-11-14 DIAGNOSIS — Z01818 Encounter for other preprocedural examination: Secondary | ICD-10-CM | POA: Diagnosis not present

## 2022-11-14 HISTORY — DX: Cerebral infarction, unspecified: I63.9

## 2022-11-14 LAB — BASIC METABOLIC PANEL
Anion gap: 9 (ref 5–15)
BUN: 14 mg/dL (ref 8–23)
CO2: 23 mmol/L (ref 22–32)
Calcium: 9.8 mg/dL (ref 8.9–10.3)
Chloride: 110 mmol/L (ref 98–111)
Creatinine, Ser: 0.63 mg/dL (ref 0.44–1.00)
GFR, Estimated: >60 mL/min (ref >60)
Glucose, Bld: 87 mg/dL (ref 70–99)
Potassium: 3.9 mmol/L (ref 3.5–5.1)
Sodium: 142 mmol/L (ref 135–145)

## 2022-11-14 LAB — CBC
HCT: 44.9 % (ref 36.0–46.0)
Hemoglobin: 14.4 g/dL (ref 12.0–15.0)
MCH: 28.6 pg (ref 26.0–34.0)
MCHC: 32.1 g/dL (ref 30.0–36.0)
MCV: 89.1 fL (ref 80.0–100.0)
Platelets: 132 10*3/uL — ABNORMAL LOW (ref 150–400)
RBC: 5.04 MIL/uL (ref 3.87–5.11)
RDW: 14.6 % (ref 11.5–15.5)
WBC: 5.5 10*3/uL (ref 4.0–10.5)
nRBC: 0 % (ref 0.0–0.2)

## 2022-11-17 NOTE — H&P (Signed)
Office Visit Report     11/08/2022   --------------------------------------------------------------------------------   [name] Walker  MRN: [id number]  DOB: 20-Apr-1960, 62 year old Female  SSN: [id number]   PRIMARY CARE:  Oliver Barre, MD  PRIMARY CARE FAX:  [phone]  REFERRING:  Ria Clock, NP  PROVIDER:  Jerilee Field, M.D.  TREATING:  Bartholomew Crews, NP  LOCATION:  [facility] [phone]     --------------------------------------------------------------------------------   CC/HPI: F/u -   1) kidney stones - left ESWL for a 10 mm right prox stone in 2020. LLQ pain in 10/21 CT with 3 mm RUP stone. F/u renal US with same and negative KUB 12/21. Repeat CT 12/21 (abdominal pain) again showed 3 mm RUP stone.   Mar 2023. CT was benign. Small bilateral stone. She passed a small stone.   2) frequency - pt with c/o frequency 12/21. Tried Myrbetriq. UAs have been clear. She denies gross hematuria. Continues Myrbetriq. Symptoms better.   Today (01/2022) , she is seen for the above. KUB with a 3 mm RUP stone x 2. No flank pain or stone passage. No gross hematuria.   10/18/2022: Seen acutely today for evaluation over possible stone event. Current symptoms present since the end of July. Complaining of increased frequency/urgency, right lower back pain with some radiation into the flank as well as intermittent hematuria. She thinks she may have passed a stone fragment recently. She saw her gynecologist on 7/31 with the same complaints, urine culture positive for Proteus but symptoms cleared with 7-day course of Bactrim. Now recurring. Not associated with fevers or chills, nausea/vomiting. Tylenol has been helpful.   11/08/2022: [name] Walker presents today for follow-up of back possible stone event. Her urinalysis shows microscopic hematuria. She has had some right-sided flank pain and discomfort but denies fevers and chills. She has been seeing blood in her urine. She finished  a recent course of bactrim. Endorses nausea     ALLERGIES: Lisinopril TABS - Swelling, Hives Tramadol Hcl - tachycardia    MEDICATIONS: Hydrochlorothiazide  Amlodipine Besylate 10 mg tablet  Lorazepam 0.5 mg tablet  Pantoprazole Sodium 40 mg tablet, delayed release  Tylenol With Codeine     GU PSH: Catheterize For Residual - 2020 Cysto Uretero Lithotripsy - 2011 Cystoscopy Insert Stent - 2011 ESWL, Right - 2020, 2018       PSH Notes: Kidney Surgery - 1985   NON-GU PSH: No Non-GU PSH    GU PMH: Hemorrhagic cystitis - 10/18/2022 Renal calculus - 10/18/2022, doing well. Disc diet changes to prevent stones. Disc surv vs URS. She will cont surv. , - 02/08/2022 (Stable), disc diet changes to prevent stone. stone sent for analysis , - 08/18/2021, - 05/18/2021, Disc the small stone and nature r/b of cont surv, URS or ESWL. Stone small and cont surveillance. , - 2022, - 2021, - 2021, - 2017 Gross hematuria, resolved with stone passage - 08/18/2021, - 05/18/2021, - 2021, - 2021, - 2019 Urinary Frequency - 08/18/2021, Continue Myrbetriq. , - 2022, - 2021 Flank Pain - 05/18/2021, - 2019 Urinary Urgency - 2021 Ureteral calculus - 2020, - 2020, Calculus of right ureter, - 2014, Calculus of distal right ureter, - 2014 Urinary Tract Inf, Unspec site - 2020 Renal and ureteral calculus (Worsening, Chronic), Left, No progression of left mid ureteral stone. Will start pt on Tamsulosin 0.4 mg 1 po daily. Recommend proceeding with elective ESWL. Given strainer. Instructed if stone captured bring to office. Tramadol 50 mg 1 po  Q6 hrs prn. Culture urine. Currently on Cipro BID. - 2018 Hydronephrosis Unspec, Hydronephrosis, right - 2014      PMH Notes:  1898-02-27 00:00:00 - Note: Normal Routine History And Physical Adult  2006-04-27 13:51:33 - Note: Arthritis   NON-GU PMH: Personal history of other diseases of the digestive system, History of esophageal reflux - 2014    FAMILY HISTORY: Breast Cancer -  Sister Carcinoma Of The Stomach - Father Death - Father Diabetes - Mother, Sister Family Health Status Number - Runs In Family nephrolithiasis - Runs in Family Prostate Cancer - Father Urologic Disorder - Father   SOCIAL HISTORY: Marital Status: Single Preferred Language: English; Ethnicity: Not Hispanic Or Latino; Race: Black or African American Current Smoking Status: Patient smokes. Smokes 1/2 pack per day.   Tobacco Use Assessment Completed: Used Tobacco in last 30 days? Has never drank.  Does not drink caffeine. Patient's occupation Archivist Asst.     Notes: 2 sons, 1 daughter   REVIEW OF SYSTEMS:    GU Review Female:   hematuria.  Patient denies frequent urination, hard to postpone urination, burning /pain with urination, get up at night to urinate, leakage of urine, stream starts and stops, trouble starting your stream, have to strain to urinate, and being pregnant.  Gastrointestinal (Upper):   Patient reports nausea. Patient denies vomiting and indigestion/ heartburn.  Gastrointestinal (Lower):   Patient reports constipation. Patient denies diarrhea.  Constitutional:   Patient denies fever, night sweats, weight loss, and fatigue.  Skin:   Patient denies skin rash/ lesion and itching.  Eyes:   Patient denies blurred vision and double vision.  Ears/ Nose/ Throat:   Patient denies sore throat and sinus problems.  Hematologic/Lymphatic:   Patient denies swollen glands and easy bruising.  Cardiovascular:   Patient denies leg swelling and chest pains.  Respiratory:   Patient denies cough and shortness of breath.  Endocrine:   Patient denies excessive thirst.  Musculoskeletal:   Patient reports back pain. Patient denies joint pain.  Neurological:   Patient denies headaches and dizziness.  Psychologic:   Patient denies depression and anxiety.   VITAL SIGNS:      11/08/2022 10:37 AM  BP 136/88 mmHg  Pulse 61 /min  Temperature 97.4 F / 36.3 C   GU PHYSICAL  EXAMINATION:      Notes: right sided flank tenderness   MULTI-SYSTEM PHYSICAL EXAMINATION:    Constitutional: Well-nourished. No physical deformities. Normally developed. Good grooming.  Respiratory: No labored breathing, no use of accessory muscles.   Cardiovascular: Normal temperature, normal extremity pulses, no swelling, no varicosities.  Skin: No paleness, no jaundice, no cyanosis. No lesion, no ulcer, no rash.  Neurologic / Psychiatric: Oriented to time, oriented to place, oriented to person. No depression, no anxiety, no agitation.  Gastrointestinal: No mass, no tenderness, no rigidity, non obese abdomen.     Complexity of Data:  Source Of History:  Patient  Records Review:   Previous Doctor Records, Previous Patient Records  Urine Test Review:   Urinalysis, Urine Culture  X-Ray Review: KUB: Reviewed Films. Reviewed Report. Discussed With Patient.  Renal Ultrasound (Limited): Reviewed Films. Reviewed Report. Discussed With Patient.  C.T. Abdomen/Pelvis: Reviewed Films. Reviewed Report. Discussed With Patient.     11/08/22  Urinalysis  Urine Appearance Cloudy   Urine Color Red   Urine Glucose Neg mg/dL  Urine Bilirubin Neg mg/dL  Urine Ketones Neg mg/dL  Urine Specific Gravity 1.030   Urine Blood 3+ ery/uL  Urine pH 5.5   Urine Protein 1+ mg/dL  Urine Urobilinogen 0.2 mg/dL  Urine Nitrites Neg   Urine Leukocyte Esterase Trace leu/uL  Urine WBC/hpf 0 - 5/hpf   Urine RBC/hpf >60/hpf   Urine Epithelial Cells 0 - 5/hpf   Urine Bacteria Rare (0-9/hpf)   Urine Mucous Present   Urine Yeast NS (Not Seen)   Urine Trichomonas Not Present   Urine Cystals NS (Not Seen)   Urine Casts NS (Not Seen)   Urine Sperm Not Present    PROCEDURES:         C.T. Urogram - [id number]      Patient confirmed No Neulasta OnPro Device.          Renal Ultrasound (Limited) - [id number]  Kidney: Right Length: 10.4cm Depth: 6.18 cm Cortical Width: 1.42 cm Width: 5.28 cm    Right Kidney/Ureter:   no hydro noted, several small non obstructive calc  Bladder:  PVR 16.81cm      . Patient confirmed No Neulasta OnPro Device.            KUB - [id number]  A single view of the abdomen is obtained. Prominent overlying bowel gas pattern. Stable pelvic phleboliths within the pelvic inlet. Within the right renal shadow there does appear to be a 5 mm opacity at the level of L2.      . Patient confirmed No Neulasta OnPro Device.           Urinalysis w/Scope Dipstick Dipstick Cont'd Micro  Color: Red Bilirubin: Neg mg/dL WBC/hpf: 0 - 5/hpf  Appearance: Cloudy Ketones: Neg mg/dL RBC/hpf: >63/KZS  Specific Gravity: 1.030 Blood: 3+ ery/uL Bacteria: Rare (0-9/hpf)  pH: 5.5 Protein: 1+ mg/dL Cystals: NS (Not Seen)  Glucose: Neg mg/dL Urobilinogen: 0.2 mg/dL Casts: NS (Not Seen)    Nitrites: Neg Trichomonas: Not Present    Leukocyte Esterase: Trace leu/uL Mucous: Present      Epithelial Cells: 0 - 5/hpf      Yeast: NS (Not Seen)      Sperm: Not Present    Notes: Unspun micro due to turbidity.    ASSESSMENT:      ICD-10 Details  1 GU:   Gross hematuria - R31.0 Acute, Uncomplicated  2   Renal calculus - N20.0 Right, Chronic, Worsening   PLAN:            Medications New Meds: Ondansetron Odt 4 mg tablet,disintegrating 1 tablet PO Q 6 H PRN nausea  #20  0 Refill(s)  Oxycodone-Acetaminophen 5 mg-325 mg tablet 1 tablet PO Q 8 H PRN severe kidney stone pain  #15  0 Refill(s)  Pharmacy Name:  [id number]  Address:  [address]   [place], [place] [address]  Phone:  [phone]  Fax:  [phone]            Orders Labs CULTURE, URINE  X-Rays: C.T. Stone Protocol Without I.V. Contrast - gross hematuria, abdominal pain, KUB and renal US are inconclusive  X-Ray Notes: History:  Hematuria: Yes/No  Patient to see MD after exam: Yes/No  Previous exam: CT / IVP/ US/ KUB/ None  When:  Where:  Diabetic: Yes/ No  BUN/ Creatinine:  Date of last BUN  Creatinine:  Weight in pounds:  Allergy- IV Contrast: Yes/ No  Conflicting diabetic meds: Yes/ No  Diabetic Meds:  Prior Authorization #Drucie Opitz #[id number] Valid 11/08/22 thru 05/07/23            Schedule Return Visit/Planned  Activity: Next Available Appointment - Schedule Surgery          Document Letter(s):  Created for Patient: Clinical Summary         Notes:   Urine was sent for culture today. KUB shows a potential right-sided renal stone but is difficult to see due to bowel gas. Renal ultrasound is without obstruction. CT imaging obtained which does show a 5 mm stone within the renal pelvis. Urinalysis will be sent for precautionary culture today. Stone intervention was discussed in detail today. For ureteroscopy, the patient understands that there is a chance for a staged procedure. Patient also understands that there is risk for bleeding, infection, injury to surrounding organs, and general risks of anesthesia. The patient also understands the placement of a stent and the risks of stent placement including, risk for infection, the risk for pain, and the risk for injury. For ESWL, the patient understands that there is a chance of failure of procedure, there is also a risk for bruising, infection, bleeding, and injury to surrounding structures. The patient verbalized understanding to these risks.   She would like to pursue ureteroscopy. A green sheet was placed. Nausea and pain medication was sent to her pharmacy.    * Signed by Bartholomew Crews, NP on 11/09/22 at 7:36 AM (EDT)*     ADD: Urine cx was no growth.

## 2022-11-20 NOTE — Progress Notes (Signed)
Pt aware to arrive at [facility] admitting at 0745 for scheduled surgery on Tuesday 11/21/2022. No food after midnight; clear liquids from midnight till 0700 then nothing by mouth.

## 2022-11-21 ENCOUNTER — Ambulatory Visit (HOSPITAL_BASED_OUTPATIENT_CLINIC_OR_DEPARTMENT_OTHER): Payer: Medicare HMO | Admitting: Certified Registered"

## 2022-11-21 ENCOUNTER — Other Ambulatory Visit: Payer: Self-pay

## 2022-11-21 ENCOUNTER — Ambulatory Visit (HOSPITAL_COMMUNITY)
Admission: RE | Admit: 2022-11-21 | Discharge: 2022-11-21 | Disposition: A | Discharge status: Home / Self Care | Payer: Medicare HMO | Source: Ambulatory Visit | Attending: Urology | Admitting: Urology

## 2022-11-21 ENCOUNTER — Encounter (HOSPITAL_COMMUNITY): Payer: Self-pay | Admitting: Urology

## 2022-11-21 ENCOUNTER — Ambulatory Visit (HOSPITAL_COMMUNITY): Payer: Medicare HMO | Admitting: Certified Registered"

## 2022-11-21 ENCOUNTER — Ambulatory Visit (HOSPITAL_COMMUNITY): Payer: Medicare HMO

## 2022-11-21 ENCOUNTER — Other Ambulatory Visit: Payer: Self-pay | Admitting: Internal Medicine

## 2022-11-21 ENCOUNTER — Ambulatory Visit: Payer: Medicare HMO | Admitting: Physical Medicine & Rehabilitation

## 2022-11-21 ENCOUNTER — Encounter (HOSPITAL_COMMUNITY)
Admission: RE | Disposition: A | Discharge status: Home / Self Care | Payer: Self-pay | Source: Ambulatory Visit | Attending: Urology

## 2022-11-21 DIAGNOSIS — F17210 Nicotine dependence, cigarettes, uncomplicated: Secondary | ICD-10-CM | POA: Diagnosis not present

## 2022-11-21 DIAGNOSIS — F419 Anxiety disorder, unspecified: Secondary | ICD-10-CM | POA: Insufficient documentation

## 2022-11-21 DIAGNOSIS — Z8673 Personal history of transient ischemic attack (TIA), and cerebral infarction without residual deficits: Secondary | ICD-10-CM | POA: Insufficient documentation

## 2022-11-21 DIAGNOSIS — F32A Depression, unspecified: Secondary | ICD-10-CM | POA: Diagnosis not present

## 2022-11-21 DIAGNOSIS — I739 Peripheral vascular disease, unspecified: Secondary | ICD-10-CM | POA: Insufficient documentation

## 2022-11-21 DIAGNOSIS — K219 Gastro-esophageal reflux disease without esophagitis: Secondary | ICD-10-CM | POA: Diagnosis not present

## 2022-11-21 DIAGNOSIS — I10 Essential (primary) hypertension: Secondary | ICD-10-CM | POA: Diagnosis not present

## 2022-11-21 DIAGNOSIS — N200 Calculus of kidney: Secondary | ICD-10-CM | POA: Diagnosis not present

## 2022-11-21 DIAGNOSIS — N202 Calculus of kidney with calculus of ureter: Secondary | ICD-10-CM | POA: Diagnosis not present

## 2022-11-21 DIAGNOSIS — Z79899 Other long term (current) drug therapy: Secondary | ICD-10-CM | POA: Diagnosis not present

## 2022-11-21 DIAGNOSIS — R310 Gross hematuria: Secondary | ICD-10-CM | POA: Insufficient documentation

## 2022-11-21 HISTORY — PX: CYSTOSCOPY/URETEROSCOPY/HOLMIUM LASER/STENT PLACEMENT: SHX6546

## 2022-11-21 SURGERY — CYSTOSCOPY/URETEROSCOPY/HOLMIUM LASER/STENT PLACEMENT
Anesthesia: General | Laterality: Right

## 2022-11-21 MED ORDER — IOHEXOL 300 MG/ML  SOLN
INTRAMUSCULAR | Status: DC | PRN
  Administered 2022-11-21: 3 mL

## 2022-11-21 MED ORDER — PROMETHAZINE HCL 25 MG/ML IJ SOLN: 6.2500 mg | INTRAMUSCULAR | Status: DC | PRN

## 2022-11-21 MED ORDER — FENTANYL CITRATE (PF) 100 MCG/2ML IJ SOLN
INTRAMUSCULAR | Status: AC
  Filled 2022-11-21: qty 2

## 2022-11-21 MED ORDER — ACETAMINOPHEN 500 MG PO TABS
1000.0000 mg | ORAL_TABLET | Freq: Once | ORAL | Status: AC
  Administered 2022-11-21: 1000 mg via ORAL
  Filled 2022-11-21: qty 2

## 2022-11-21 MED ORDER — DEXAMETHASONE SODIUM PHOSPHATE 10 MG/ML IJ SOLN
INTRAMUSCULAR | Status: AC
  Filled 2022-11-21: qty 1

## 2022-11-21 MED ORDER — LIDOCAINE 2% (20 MG/ML) 5 ML SYRINGE
INTRAMUSCULAR | Status: DC | PRN
  Administered 2022-11-21: 40 mg via INTRAVENOUS

## 2022-11-21 MED ORDER — LACTATED RINGERS IV SOLN: INTRAVENOUS | Status: DC

## 2022-11-21 MED ORDER — FENTANYL CITRATE (PF) 100 MCG/2ML IJ SOLN
INTRAMUSCULAR | Status: DC | PRN
  Administered 2022-11-21: 50 ug via INTRAVENOUS

## 2022-11-21 MED ORDER — MIDAZOLAM HCL 2 MG/2ML IJ SOLN
INTRAMUSCULAR | Status: DC | PRN
  Administered 2022-11-21: 2 mg via INTRAVENOUS

## 2022-11-21 MED ORDER — ORAL CARE MOUTH RINSE: 15.0000 mL | Freq: Once | OROMUCOSAL | Status: AC

## 2022-11-21 MED ORDER — ATROPINE SULFATE 0.4 MG/ML IV SOLN
INTRAVENOUS | Status: AC
  Filled 2022-11-21: qty 1

## 2022-11-21 MED ORDER — NITROFURANTOIN MACROCRYSTAL 100 MG PO CAPS: 100.0000 mg | ORAL_CAPSULE | Freq: Every day | ORAL | 0 refills | Status: DC

## 2022-11-21 MED ORDER — ATROPINE SULFATE 0.4 MG/ML IV SOLN
INTRAVENOUS | Status: DC | PRN
Start: 2022-11-21 — End: 2022-11-21
  Administered 2022-11-21: .4 mg via INTRAVENOUS

## 2022-11-21 MED ORDER — MIDAZOLAM HCL 2 MG/2ML IJ SOLN
INTRAMUSCULAR | Status: AC
  Filled 2022-11-21: qty 2

## 2022-11-21 MED ORDER — PROPOFOL 10 MG/ML IV BOLUS
INTRAVENOUS | Status: DC | PRN
  Administered 2022-11-21: 120 mg via INTRAVENOUS

## 2022-11-21 MED ORDER — CHLORHEXIDINE GLUCONATE 0.12 % MT SOLN
15.0000 mL | Freq: Once | OROMUCOSAL | Status: AC
  Administered 2022-11-21: 15 mL via OROMUCOSAL

## 2022-11-21 MED ORDER — DROPERIDOL 2.5 MG/ML IJ SOLN: 0.6250 mg | Freq: Once | INTRAMUSCULAR | Status: DC | PRN

## 2022-11-21 MED ORDER — GLYCOPYRROLATE 0.2 MG/ML IJ SOLN
INTRAMUSCULAR | Status: AC
  Filled 2022-11-21: qty 1

## 2022-11-21 MED ORDER — FENTANYL CITRATE PF 50 MCG/ML IJ SOSY: 25.0000 ug | PREFILLED_SYRINGE | INTRAMUSCULAR | Status: DC | PRN

## 2022-11-21 MED ORDER — DEXAMETHASONE SODIUM PHOSPHATE 10 MG/ML IJ SOLN
INTRAMUSCULAR | Status: DC | PRN
  Administered 2022-11-21: 4 mg via INTRAVENOUS

## 2022-11-21 MED ORDER — SODIUM CHLORIDE 0.9 % IV SOLN
2.0000 g | INTRAVENOUS | Status: AC
  Administered 2022-11-21: 2 g via INTRAVENOUS
  Filled 2022-11-21: qty 20

## 2022-11-21 MED ORDER — SODIUM CHLORIDE 0.9 % IR SOLN
Status: DC | PRN
  Administered 2022-11-21: 3000 mL

## 2022-11-21 MED ORDER — GLYCOPYRROLATE 0.2 MG/ML IJ SOLN
INTRAMUSCULAR | Status: DC | PRN
Start: 2022-11-21 — End: 2022-11-21
  Administered 2022-11-21: .2 mg via INTRAVENOUS

## 2022-11-21 MED ORDER — ONDANSETRON HCL 4 MG/2ML IJ SOLN
INTRAMUSCULAR | Status: AC
  Filled 2022-11-21: qty 2

## 2022-11-21 MED ORDER — PROPOFOL 10 MG/ML IV BOLUS
INTRAVENOUS | Status: AC
  Filled 2022-11-21: qty 20

## 2022-11-21 SURGICAL SUPPLY — 23 items
BAG URO CATCHER STRL LF (MISCELLANEOUS) ×2 IMPLANT
BASKET ZERO TIP NITINOL 2.4FR (BASKET) IMPLANT
BSKT STON RTRVL ZERO TP 2.4FR (BASKET)
CATH URET 5FR 70CM CONE TIP (BALLOONS) IMPLANT
CATH URETL OPEN END 6FR 70 (CATHETERS) ×2 IMPLANT
CLOTH BEACON ORANGE TIMEOUT ST (SAFETY) ×2 IMPLANT
FIBER LASER MOSES 200 DFL (Laser) IMPLANT
FIBER LASER MOSES 365 DFL (Laser) IMPLANT
GLOVE BIO SURGEON STRL SZ7.5 (GLOVE) ×2 IMPLANT
GOWN STRL REUS W/ TWL XL LVL3 (GOWN DISPOSABLE) ×2 IMPLANT
GOWN STRL REUS W/TWL XL LVL3 (GOWN DISPOSABLE) ×1
GUIDEWIRE STR DUAL SENSOR (WIRE) ×2 IMPLANT
GUIDEWIRE ZIPWRE .038 STRAIGHT (WIRE) IMPLANT
KIT TURNOVER KIT A (KITS) IMPLANT
MANIFOLD NEPTUNE II (INSTRUMENTS) ×2 IMPLANT
PACK CYSTO (CUSTOM PROCEDURE TRAY) ×2 IMPLANT
SCOPE LITHOVUE DISP (UROLOGICAL SUPPLIES) IMPLANT
SCOPE LITHOVUE DISPOSABLE (UROLOGICAL SUPPLIES) ×1
SHEATH NAVIGATOR HD 11/13X28 (SHEATH) IMPLANT
SHEATH NAVIGATOR HD 11/13X36 (SHEATH) IMPLANT
STENT URET 6FRX24 CONTOUR (STENTS) IMPLANT
TUBING CONNECTING 10 (TUBING) ×2 IMPLANT
TUBING UROLOGY SET (TUBING) ×2 IMPLANT

## 2022-11-21 NOTE — Op Note (Signed)
Preoperative diagnosis: Right renal stones Postoperative diagnosis: Same  Procedure: Cystoscopy with right retrograde pyelogram, right ureteroscopy laser lithotripsy and right ureteral stent placement  Surgeon: Mena Goes  Anesthesia: General  Indication for procedure: [name] Walker is a 62 year old female with symptomatic right renal pelvic stone.  She elected to proceed with ureteroscopy.  Findings: On cystoscopy the urethra and the bladder were unremarkable.  No stone in the bladder.  No foreign body or mucosal lesions.  Right retrograde pyelogram-this outlined a single ureter single collecting system unit without obvious filling defect, stricture or dilation.  Right ureteroscopy revealed the stone at the right UPJ, small stone in the right midpole and a narrow infundibulum.  The stones were dusted.  Description of procedure: After consent was obtained patient brought to the operating room.  After adequate anesthesia she is placed lithotomy position and prepped and draped in the usual sterile fashion.  Timeout was performed to confirm the patient and procedure.  Cystoscope was passed per urethra and the bladder inspected.  Open-ended catheter was inserted into the right ureteral orifice and gentle retrograde injection of contrast was performed.  This outlined the ureter and the collecting system.  No pyelovenous backflow was noted.  I then passed a sensor wire and coiled that in the upper calyx.  I then passed the inner cannula of a short access sheath which went very easily and then passed the access sheath which went easily.  I used the access sheath to get 2 wires in place.  I then went adjacent to the sensor wire leaving it as the safety.  The [employer] single channel disposal flexible ureteroscope was advanced up into the collecting system where it was inspected.  There was a narrow infundibulum in the mid to lower pole and I was able to get the scope down into this as it was a single channel  and narrow.  A small stone was located and a 242 m laser fiber was advanced and the stone was dusted.  I then reinspected the collecting system and renal pelvis and could not locate the renal pelvic stone.  The access sheath even though it was short was just up into the renal pelvis so I carefully backed the access sheath back and located the stone just behind the sheath.  The stone was then pushed into a midpole calyx where it was dusted.  No other stones were noted.  The access sheath was backed out on the ureteroscope and the collecting system, renal pelvis and ureter carefully inspected.  No other stone fragments were noted no injury.  The wire was backloaded on the cystoscope and a 6 x 24 cm stent was advanced.  The wire was removed with a good coil seen in the collecting system and a good coil in the bladder.  The scope was removed.  She was awakened and taken to the recovery room in stable condition.  Complications: None  Blood loss: Minimal  Specimens: None  Drains: 6 x 24 cm right ureteral stent with tether  Disposition: Patient stable to PACU.

## 2022-11-21 NOTE — Anesthesia Preprocedure Evaluation (Addendum)
Anesthesia Evaluation  Patient identified by MRN, date of birth, ID band Patient awake    Reviewed: Allergy & Precautions, NPO status , Patient's Chart, lab work & pertinent test results  Airway Mallampati: II  TM Distance: >3 FB Neck ROM: Full    Dental  (+) Chipped, Poor Dentition, Dental Advisory Given, Missing   Pulmonary asthma , Current Smoker and Patient abstained from smoking.   Pulmonary exam normal breath sounds clear to auscultation       Cardiovascular hypertension, Pt. on medications (-) angina + Peripheral Vascular Disease  Normal cardiovascular exam Rhythm:Regular Rate:Normal  Echo 2021  1. Left ventricular ejection fraction, by estimation, is 60 to 65%. The left ventricle has normal function. The left ventricle has no regional wall motion abnormalities. Left ventricular diastolic parameters were normal.   2. Right ventricular systolic function is normal. The right ventricular size is normal. There is normal pulmonary artery systolic pressure. The estimated right ventricular systolic pressure is 28.4 mmHg.   3. The mitral valve is normal in structure. Trivial mitral valve regurgitation. No evidence of mitral stenosis.   4. The aortic valve is tricuspid. Aortic valve regurgitation is not visualized. No aortic stenosis is present.   5. The inferior vena cava is normal in size with greater than 50% respiratory variability, suggesting right atrial pressure of 3 mmHg.   Conclusion(s)/Recommendation(s): No intracardiac source of embolism detected on this transthoracic study.     Neuro/Psych  PSYCHIATRIC DISORDERS Anxiety Depression    TIA Neuromuscular disease CVA, No Residual Symptoms    GI/Hepatic Neg liver ROS,GERD  ,,  Endo/Other  negative endocrine ROS    Renal/GU Renal disease     Musculoskeletal  (+) Arthritis ,    Abdominal  (+) + obese  Peds negative pediatric ROS (+)  Hematology negative hematology  ROS (+)   Anesthesia Other Findings   Reproductive/Obstetrics negative OB ROS                             Anesthesia Physical Anesthesia Plan  ASA: 2  Anesthesia Plan: General   Post-op Pain Management: Tylenol PO (pre-op)*   Induction: Intravenous  PONV Risk Score and Plan: 4 or greater and Dexamethasone, Treatment may vary due to age or medical condition, Ondansetron and Midazolam  Airway Management Planned: LMA  Additional Equipment:   Intra-op Plan:   Post-operative Plan: Extubation in OR  Informed Consent: I have reviewed the patients History and Physical, chart, labs and discussed the procedure including the risks, benefits and alternatives for the proposed anesthesia with the patient or authorized representative who has indicated his/her understanding and acceptance.     Dental advisory given  Plan Discussed with: CRNA  Anesthesia Plan Comments:         Anesthesia Quick Evaluation

## 2022-11-21 NOTE — Anesthesia Postprocedure Evaluation (Signed)
Anesthesia Post Note  Patient: [name] Walker  Procedure(s) Performed: CYSTOSCOPY RIGHT RETROGRADE PYELOGRAM RIGHT URETEROSCOPY/HOLMIUM LASER/STENT PLACEMENT (Right)     Patient location during evaluation: PACU Anesthesia Type: General Level of consciousness: sedated and patient cooperative Pain management: pain level controlled Vital Signs Assessment: post-procedure vital signs reviewed and stable Respiratory status: spontaneous breathing Cardiovascular status: stable Anesthetic complications: no   No notable events documented.  Last Vitals:  Vitals:   11/21/22 1335 11/21/22 1345  BP:  (!) 148/85  Pulse:  80  Resp:    Temp: 36.4 C 36.4 C  SpO2:  100%    Last Pain:  Vitals:   11/21/22 1335  TempSrc:   PainSc: 3                  Lewie Loron

## 2022-11-21 NOTE — Transfer of Care (Signed)
Immediate Anesthesia Transfer of Care Note  Patient: [name] Walker  Procedure(s) Performed: CYSTOSCOPY RIGHT RETROGRADE PYELOGRAM RIGHT URETEROSCOPY/HOLMIUM LASER/STENT PLACEMENT (Right)  Patient Location: PACU  Anesthesia Type:General  Level of Consciousness: drowsy and patient cooperative  Airway & Oxygen Therapy: Patient Spontanous Breathing and Patient connected to face mask oxygen  Post-op Assessment: Report given to RN and Post -op Vital signs reviewed and stable  Post vital signs: Reviewed and stable  Last Vitals:  Vitals Value Taken Time  BP 148/104 11/21/22 1256  Temp    Pulse 84 11/21/22 1258  Resp 23 11/21/22 1258  SpO2 100 % 11/21/22 1258  Vitals shown include unfiled device data.  Last Pain:  Vitals:   11/21/22 0827  TempSrc:   PainSc: 6       Patients Stated Pain Goal: 5 (11/21/22 0827)  Complications: No notable events documented.

## 2022-11-21 NOTE — Anesthesia Procedure Notes (Signed)
Procedure Name: LMA Insertion Date/Time: 11/21/2022 11:53 AM  Performed by: Sindy Guadeloupe, CRNAPre-anesthesia Checklist: Patient identified, Emergency Drugs available, Suction available, Patient being monitored and Timeout performed Patient Re-evaluated:Patient Re-evaluated prior to induction Oxygen Delivery Method: Circle system utilized Preoxygenation: Pre-oxygenation with 100% oxygen Induction Type: IV induction Ventilation: Mask ventilation without difficulty LMA: LMA inserted LMA Size: 4.0 Number of attempts: 1 Tube secured with: Tape Dental Injury: Teeth and Oropharynx as per pre-operative assessment

## 2022-11-21 NOTE — Interval H&P Note (Signed)
History and Physical Interval Note:  11/21/2022 10:38 AM  [name] Walker  has presented today for surgery, with the diagnosis of RIGHT URETERAL PELVIC JUNCTION STONE. The various methods of treatment have been discussed with the patient and family. After consideration of risks, benefits and other options for treatment, the patient has consented to  Procedure(s) with comments: CYSTOSCOPY RIGHT RETROGRADE PYELOGRAM RIGHT URETEROSCOPY/HOLMIUM LASER/STENT PLACEMENT (Right) - 60 MINS FOR CASE as a surgical intervention.  The patient's history has been reviewed, patient examined, no change in status, stable for surgery.  I have reviewed the patient's chart and labs.  Questions were answered to the patient's satisfaction.  She has been well with dark urine but no dysuria.  No fever or congestion.  Discussed she may need a staged procedure with a prestent.  She denies any stone passage and continues to have some mild intermittent right flank pain.   Jerilee Field

## 2022-11-21 NOTE — Discharge Instructions (Signed)
Removal of the stent: Remove the stent on Monday morning, November 27, 2022 by pulling the string gently as instructed.

## 2022-11-22 ENCOUNTER — Encounter (HOSPITAL_COMMUNITY): Payer: Self-pay | Admitting: Urology

## 2022-11-23 ENCOUNTER — Encounter: Payer: Self-pay | Admitting: Physical Medicine & Rehabilitation

## 2022-11-23 ENCOUNTER — Encounter: Payer: Medicare HMO | Admitting: Physical Medicine & Rehabilitation

## 2022-11-26 ENCOUNTER — Ambulatory Visit
Admission: RE | Admit: 2022-11-26 | Discharge: 2022-11-26 | Disposition: A | Discharge status: Home / Self Care | Payer: Medicare HMO | Source: Ambulatory Visit | Attending: Physical Medicine & Rehabilitation | Admitting: Physical Medicine & Rehabilitation

## 2022-11-26 DIAGNOSIS — M47816 Spondylosis without myelopathy or radiculopathy, lumbar region: Secondary | ICD-10-CM | POA: Diagnosis not present

## 2022-11-26 DIAGNOSIS — M5126 Other intervertebral disc displacement, lumbar region: Secondary | ICD-10-CM | POA: Diagnosis not present

## 2022-11-26 DIAGNOSIS — M961 Postlaminectomy syndrome, not elsewhere classified: Secondary | ICD-10-CM

## 2022-11-26 MED ORDER — GADOPICLENOL 0.5 MMOL/ML IV SOLN
7.0000 mL | Freq: Once | INTRAVENOUS | Status: AC | PRN
  Administered 2022-11-26: 7 mL via INTRAVENOUS

## 2022-11-28 DIAGNOSIS — N202 Calculus of kidney with calculus of ureter: Secondary | ICD-10-CM | POA: Diagnosis not present

## 2022-12-01 ENCOUNTER — Encounter: Payer: Self-pay | Admitting: Physical Medicine & Rehabilitation

## 2022-12-01 ENCOUNTER — Encounter: Payer: Medicare HMO | Attending: Physical Medicine and Rehabilitation | Admitting: Physical Medicine & Rehabilitation

## 2022-12-01 VITALS — BP 134/85 | HR 66 | Ht 62.0 in | Wt 142.4 lb

## 2022-12-01 DIAGNOSIS — L299 Pruritus, unspecified: Secondary | ICD-10-CM | POA: Diagnosis not present

## 2022-12-01 MED ORDER — DIPHENHYDRAMINE HCL 50 MG/ML IJ SOLN
25.0000 mg | Freq: Once | INTRAMUSCULAR | Status: AC
Start: 2022-12-29 — End: 2023-01-05
  Administered 2023-01-05: 25 mg via INTRAMUSCULAR

## 2022-12-01 NOTE — Progress Notes (Signed)
Subjective:    Patient ID: [name] Walker, female    DOB: 08-Jun-1960, 62 y.o.   MRN: [id number]  HPI 62 year old female with history of chronic low back pain she has undergone left hemilaminotomy of L5-S1 several years ago.  She was complaining of primarily low back pain earlier this spring and underwent sacroiliac injections on the right side in May and the left side in June 2024.  She had a short-term relief with these injections.  At last visit in August 2024 she was complaining more of lower extremity pain.  She typically had left lower extremity pain now she had right lower extremity pain extending to the feet. She has no numbness or tingling no bowel or bladder dysfunction. She continues to be modified independent with all self-care and mobility and she walks independently without a device.  She is on Tylenol No. 4 with codeine twice a day. Reviewed her multiple medication allergies including intolerances to pregabalin causing anxiety and nonspecified intolerance to gabapentin.  We discussed her other medications that she is on already including Zoloft and trazodone.  We discussed that Cymbalta would add too much to her serotonin levels. Pain Inventory Average Pain 8 Pain Right Now 8 My pain is sharp, dull, and aching  In the last 24 hours, has pain interfered with the following? General activity 0 Relation with others 10 Enjoyment of life 0 What TIME of day is your pain at its worst? morning , daytime, evening, and night Sleep (in general) Fair  Pain is worse with: bending, sitting, standing, and some activites Pain improves with: rest, heat/ice, and medication Relief from Meds: 2  Family History  Problem Relation Age of Onset   Colon cancer Sister        dx in her 56s   Diabetes Mother    Colon cancer Father        dx in his 70s   Prostate cancer Father    Breast cancer Maternal Aunt        87's   Esophageal cancer Neg Hx    Stomach cancer Neg Hx    Social History    Socioeconomic History   Marital status: Legally Separated    Spouse name: Les   Number of children: 3   Years of education: Not on file   Highest education level: Not on file  Occupational History   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  Tobacco Use   Smoking status: Every Day    Current packs/day: 0.50    Average packs/day: 0.5 packs/day for 20.0 years (10.0 ttl pk-yrs)    Types: Cigarettes    Passive exposure: Never   Smokeless tobacco: Never  Vaping Use   Vaping status: Never Used  Substance and Sexual Activity   Alcohol use: No   Drug use: No   Sexual activity: Not Currently    Birth control/protection: Post-menopausal, Surgical    Comment: 1st intercourse 62 yo-Fewer than 5 partners-BTL  Other Topics Concern   Not on file  Social History Narrative   Not on file   Social Determinants of Health   Financial Resource Strain: Not on file  Food Insecurity: Not on file  Transportation Needs: Not on file  Physical Activity: Not on file  Stress: Not on file  Social Connections: Unknown (07/12/2021)   [facility]   Social Network    Social Network: Not on file   Past Surgical History:  Procedure Laterality Date   ANTERIOR  CERVICAL DECOMP/DISCECTOMY FUSION N/A 04/07/2016   Procedure: Cervical two-three Anterior cervical decompression/discectomy/fusion;  Surgeon: Coletta Memos, MD;  Location: [facility] OR;  Service: Neurosurgery;  Laterality: N/A;   BACK SURGERY  2015   lower back   CYSTOSCOPY/URETEROSCOPY/HOLMIUM LASER/STENT PLACEMENT Right 11/21/2022   Procedure: CYSTOSCOPY RIGHT RETROGRADE PYELOGRAM RIGHT URETEROSCOPY/HOLMIUM LASER/STENT PLACEMENT;  Surgeon: Jerilee Field, MD;  Location: [facility];  Service: Urology;  Laterality: Right;  60 MINS FOR CASE   EXTRACORPOREAL SHOCK WAVE LITHOTRIPSY Left 09/28/2016   Procedure: LEFT EXTRACORPOREAL SHOCK WAVE LITHOTRIPSY (ESWL);  Surgeon: Jerilee Field, MD;  Location: [facility];  Service: Urology;   Laterality: Left;   EXTRACORPOREAL SHOCK WAVE LITHOTRIPSY Right 04/08/2018   Procedure: EXTRACORPOREAL SHOCK WAVE LITHOTRIPSY (ESWL);  Surgeon: Crist Fat, MD;  Location: [facility];  Service: Urology;  Laterality: Right;   KIDNEY SURGERY     LITHOTRIPSY  05/2016   TUBAL LIGATION     Past Surgical History:  Procedure Laterality Date   ANTERIOR CERVICAL DECOMP/DISCECTOMY FUSION N/A 04/07/2016   Procedure: Cervical two-three Anterior cervical decompression/discectomy/fusion;  Surgeon: Coletta Memos, MD;  Location: [facility] OR;  Service: Neurosurgery;  Laterality: N/A;   BACK SURGERY  2015   lower back   CYSTOSCOPY/URETEROSCOPY/HOLMIUM LASER/STENT PLACEMENT Right 11/21/2022   Procedure: CYSTOSCOPY RIGHT RETROGRADE PYELOGRAM RIGHT URETEROSCOPY/HOLMIUM LASER/STENT PLACEMENT;  Surgeon: Jerilee Field, MD;  Location: [facility];  Service: Urology;  Laterality: Right;  60 MINS FOR CASE   EXTRACORPOREAL SHOCK WAVE LITHOTRIPSY Left 09/28/2016   Procedure: LEFT EXTRACORPOREAL SHOCK WAVE LITHOTRIPSY (ESWL);  Surgeon: Jerilee Field, MD;  Location: [facility];  Service: Urology;  Laterality: Left;   EXTRACORPOREAL SHOCK WAVE LITHOTRIPSY Right 04/08/2018   Procedure: EXTRACORPOREAL SHOCK WAVE LITHOTRIPSY (ESWL);  Surgeon: Crist Fat, MD;  Location: [facility];  Service: Urology;  Laterality: Right;   KIDNEY SURGERY     LITHOTRIPSY  05/2016   TUBAL LIGATION     Past Medical History:  Diagnosis Date   ACHILLES TENDINITIS    ALLERGIC RHINITIS    Allergy    Anginal pain (HCC) 04/04/2016   Pt currently feels like she is having muscle spasms and aching in her chest   Anxiety    Asthma    Chronic kidney disease    Depression    GERD    History of kidney stones    HYPERTENSION    LATERAL EPICONDYLITIS, RIGHT    LOW BACK PAIN    Plantar fascial fibromatosis    Stress    Stroke (HCC)    SUBACROMIAL BURSITIS, RIGHT    Thoracic scoliosis 10/01/2015   TIA (transient ischemic attack)    BP 134/85    Pulse 66   Ht 5\' 2"  (1.575 m)   Wt 142 lb 6.4 oz (64.6 kg)   LMP 08/22/2010   SpO2 98%   BMI 26.05 kg/m   Opioid Risk Score:   Fall Risk Score:  `1  Depression screen [facility] 2/9     12/01/2022   11:02 AM 10/20/2022   10:55 AM 08/04/2022   10:59 AM 07/28/2022   10:03 AM 07/06/2022    9:32 AM 12/30/2021   12:41 PM 10/25/2021    3:17 PM  Depression screen PHQ 2/9  Decreased Interest 0 0 0 0 1 1 0  Down, Depressed, Hopeless 0 0 0 0 1 1 0  PHQ - 2 Score 0 0 0 0 2 2 0     Review of Systems  Constitutional: Negative.   HENT: Negative.  Eyes: Negative.   Respiratory: Negative.    Cardiovascular: Negative.   Gastrointestinal: Negative.   Endocrine: Negative.   Genitourinary:        Recent kidney stones  Musculoskeletal:  Positive for back pain.  Allergic/Immunologic: Negative.   Neurological: Negative.   Hematological: Negative.   Psychiatric/Behavioral: Negative.    All other systems reviewed and are negative.      Objective:   Physical Exam  General no acute distress Mood affect appropriate She has mild tenderness palpation bilateral lumbar paraspinal area. Negative straight leg raise bilaterally 5/5 strength bilateral hip flexor knee extensor ankle dorsiflexor Normal sensation light touch bilateral L2-L3-L4 L5-S1 dermatomal distributions Deep tendon reflexes are 2+ bilateral knees and ankles bilaterally. No pain with hip knee or ankle range of motion. Lumbar spine has approximately 50% forward flexion 25% extension      Assessment & Plan:  1.  Lumbar postlaminectomy syndrome with chronic pain.  She has developed some increasing lower extremity pain reviewed her MRI from last week.  She does appear to have some increasing foraminal stenosis at L5-S1 due to disc bulging as well as facet hypertrophy.  This is compared to prior MRI.  Official report is still pending. We discussed treatment options including surgical referral, medication management although limited options  given above as well as lumbar injections.  We discussed that while she had some difficulty with itching during an abdominal CT where 100 mL of Omnipaque was used, she has tolerated sacroiliac injections x 2 with small amount of Omnipaque i.e. approximately 1 cc without issue.  Patient states that her lower extremity discomfort is a daily problem and she would like to do the injections.  Will premedicate with 25 mg of diphenhydramine IM.  She will need a driver.  Plan on bilateral L5 transforaminal ESI

## 2022-12-05 ENCOUNTER — Ambulatory Visit (INDEPENDENT_AMBULATORY_CARE_PROVIDER_SITE_OTHER): Payer: Medicare HMO | Admitting: Nurse Practitioner

## 2022-12-05 ENCOUNTER — Encounter: Payer: Self-pay | Admitting: Nurse Practitioner

## 2022-12-05 ENCOUNTER — Other Ambulatory Visit (HOSPITAL_COMMUNITY)
Admission: RE | Admit: 2022-12-05 | Discharge: 2022-12-05 | Disposition: A | Discharge status: Home / Self Care | Payer: Medicare HMO | Source: Ambulatory Visit | Attending: Nurse Practitioner | Admitting: Nurse Practitioner

## 2022-12-05 VITALS — BP 128/84 | Ht 59.75 in | Wt 141.0 lb

## 2022-12-05 DIAGNOSIS — Z01419 Encounter for gynecological examination (general) (routine) without abnormal findings: Secondary | ICD-10-CM

## 2022-12-05 DIAGNOSIS — Z1151 Encounter for screening for human papillomavirus (HPV): Secondary | ICD-10-CM | POA: Diagnosis not present

## 2022-12-05 DIAGNOSIS — Z780 Asymptomatic menopausal state: Secondary | ICD-10-CM | POA: Diagnosis not present

## 2022-12-05 DIAGNOSIS — M810 Age-related osteoporosis without current pathological fracture: Secondary | ICD-10-CM | POA: Diagnosis not present

## 2022-12-05 DIAGNOSIS — Z124 Encounter for screening for malignant neoplasm of cervix: Secondary | ICD-10-CM

## 2022-12-05 MED ORDER — ALENDRONATE SODIUM 70 MG PO TABS: 70.0000 mg | ORAL_TABLET | ORAL | 3 refills | Status: DC

## 2022-12-05 NOTE — Progress Notes (Signed)
[name] 1960/03/05 [id number]   History:  62 y.o. G3P3 presents for breast and pelvic exam. Postmenopausal - no HRT, no bleeding. 11/21/2022 ureteral stent placement for kidney stone. This was her 4th stone. H/O stroke. HTN, HLD managed by PCP. Smoker. Osteoporosis, started on Fosamax in 2022 but she is unsure if she has been taking and it is not on her list.    Gynecologic History Patient's last menstrual period was 08/22/2010.   Contraception: post menopausal status Sexually active: No  Health maintenance Last Pap: 10/02/2017. Results were: Normal neg HPV, 5-year repeat Last mammogram: 06/15/2021. Results were: Normal Last colonoscopy: 11/20/2017. Results were: polyps Last Dexa: 04/19/2020. Results were: T-score -3.2  Past medical history, past surgical history, family history and social history were all reviewed and documented in the EPIC chart. Divorced. Retired Midwife. 3 children, 6 grandchildren. Father (52s) and sister (3s) with history of colon cancer.   ROS:  A ROS was performed and pertinent positives and negatives are included.  Exam:  Vitals:   12/05/22 0954  BP: 128/84  Weight: 141 lb (64 kg)  Height: 4' 11.75" (1.518 m)    Body mass index is 27.77 kg/m.  General appearance:  Normal Thyroid:  Symmetrical, normal in size, without palpable masses or nodularity. Respiratory  Auscultation:  Clear without wheezing or rhonchi Cardiovascular  Auscultation:  Regular rate, without rubs, murmurs or gallops  Edema/varicosities:  Not grossly evident Abdominal  Soft,nontender, without masses, guarding or rebound.  Liver/spleen:  No organomegaly noted  Hernia:  None appreciated  Skin  Inspection:  Grossly normal   Breasts: Examined lying and sitting.   Right: Without masses, retractions, discharge or axillary adenopathy.   Left: Without masses, retractions, discharge or axillary adenopathy. Pelvic: External genitalia:  no lesions              Urethra:   normal appearing urethra with no masses, tenderness or lesions              Bartholins and Skenes: normal                 Vagina: normal appearing vagina with normal color and discharge, no lesions              Cervix: no lesions Bimanual Exam:  Uterus:  no masses or tenderness              Adnexa: no mass, fullness, tenderness              Rectovaginal: Deferred              Anus:  normal, no lesions   Patient informed chaperone available to be present for breast and pelvic exam. Patient has requested no chaperone to be present. Patient has been advised what will be completed during breast and pelvic exam.   Assessment/Plan:  62 y.o. G3P3 for breast and pelvic exam.   Encounter for breast and pelvic examination - Education provided on SBEs, importance of preventative screenings, current guidelines, high calcium diet, regular exercise, and multivitamin daily. Labs with PCP.   Postmenopausal - No HRT, no bleeding.  Cervical cancer screening - Plan: Cytology - PAP( [place]). Normal pap history.   Age-related osteoporosis without current pathological fracture - Plan: DG Bone Density, alendronate (FOSAMAX) 70 MG tablet weekly. Started in 2022 but not on list today. Looks like refilled most recently in June 2023 by PCP. She thinks she has been taking it. New prescription provided. Will repeat DXA  now. T-score -3.2 in February 2022.  Screening for breast cancer - Normal mammogram history.  Overdue and encouraged to schedule soon. Normal breast exam today.  Screening for colon cancer - 2019 colonoscopy. Will repeat at GI's recommended interval.  Return in about 2 years (around 12/04/2024) for B&P.       [name] Walker [facility], 10:15 AM 12/05/2022

## 2022-12-07 LAB — CYTOLOGY - PAP
Adequacy: ABSENT
Comment: NEGATIVE
Diagnosis: NEGATIVE
High risk HPV: NEGATIVE

## 2022-12-21 ENCOUNTER — Ambulatory Visit
Admission: RE | Admit: 2022-12-21 | Discharge: 2022-12-21 | Disposition: A | Discharge status: Home / Self Care | Payer: Medicare HMO | Source: Ambulatory Visit | Attending: Internal Medicine | Admitting: Internal Medicine

## 2022-12-21 ENCOUNTER — Other Ambulatory Visit: Payer: Self-pay | Admitting: Internal Medicine

## 2022-12-21 DIAGNOSIS — Z1231 Encounter for screening mammogram for malignant neoplasm of breast: Secondary | ICD-10-CM | POA: Diagnosis not present

## 2022-12-22 DIAGNOSIS — F411 Generalized anxiety disorder: Secondary | ICD-10-CM | POA: Diagnosis not present

## 2022-12-22 DIAGNOSIS — F329 Major depressive disorder, single episode, unspecified: Secondary | ICD-10-CM | POA: Diagnosis not present

## 2022-12-29 NOTE — Progress Notes (Signed)
  PROCEDURE RECORD [facility]   Name: [name] Walker DOB:[birth year] MRN: [id number]  Date:12/29/2022  Physician: Claudette Laws, MD    Nurse/CMA: Charise Carwin MA  Allergies:  Allergies  Allergen Reactions   Flexeril [Cyclobenzaprine] Swelling    Tongue swells   Lisinopril Other (See Comments)    Possible tongue swelling     Other Palpitations    ALL MUSCLE RELAXERS-PER PATIENT   Robaxin [Methocarbamol] Other (See Comments)    Tongue swelling   Contrast Media [Iodinated Contrast Media] Itching   Iodine Itching   Gabapentin Other (See Comments)   Pregabalin Anxiety and Swelling    Anxiety attacks    Consent Signed: Yes.    Is patient diabetic? No.  CBG today? N/A  Pregnant: No. LMP: Patient's last menstrual period was 08/22/2010. (age 21-55)  Anticoagulants: no Anti-inflammatory: no Antibiotics: no  Procedure: Bilateral  L-5 Transforaminal Epidural Steroid Injection  Position: Prone Start Time: 10:35 AM  End Time: 10:47  Fluoro Time: 44  RN/CMA Mohit Zirbes MA Lionel Woodberry MA Jyron Turman MA   Time 10:11 AM 10:49 10:52   BP 144/91 156/101 147/88   Pulse 64 59    Respirations 16 16    O2 Sat 99 99    S/S 6 6    Pain Level 9/10 0/10     D/C home with Jonny Ruiz, patient A & O X 3, D/C instructions reviewed, and sits independently.         Subjective:    Patient ID: [name] Walker, female    DOB: Dec 21, 1960, 62 y.o.   MRN: [id number]  HPI    Review of Systems     Objective:   Physical Exam        Assessment & Plan:

## 2023-01-05 ENCOUNTER — Encounter: Payer: Medicare HMO | Attending: Physical Medicine and Rehabilitation | Admitting: Physical Medicine & Rehabilitation

## 2023-01-05 ENCOUNTER — Encounter: Payer: Self-pay | Admitting: Physical Medicine & Rehabilitation

## 2023-01-05 VITALS — BP 144/91 | HR 68 | Temp 97.9°F | Ht 60.0 in | Wt 145.0 lb

## 2023-01-05 DIAGNOSIS — M5416 Radiculopathy, lumbar region: Secondary | ICD-10-CM | POA: Insufficient documentation

## 2023-01-05 DIAGNOSIS — L299 Pruritus, unspecified: Secondary | ICD-10-CM | POA: Insufficient documentation

## 2023-01-05 MED ORDER — LIDOCAINE HCL (PF) 1 % IJ SOLN
4.0000 mL | Freq: Once | INTRAMUSCULAR | Status: AC
Start: 2023-01-05 — End: 2023-01-05
  Administered 2023-01-05: 4 mL

## 2023-01-05 MED ORDER — DIPHENHYDRAMINE HCL 50 MG/ML IJ SOLN
25.0000 mg | Freq: Once | INTRAMUSCULAR | Status: AC
Start: 2023-01-05 — End: 2023-01-05
  Administered 2023-01-05: 25 mg via INTRAMUSCULAR

## 2023-01-05 MED ORDER — LIDOCAINE HCL 1 % IJ SOLN
5.0000 mL | Freq: Once | INTRAMUSCULAR | Status: AC
Start: 2023-01-05 — End: 2023-01-05
  Administered 2023-01-05: 5 mL

## 2023-01-05 MED ORDER — DEXAMETHASONE SODIUM PHOSPHATE 10 MG/ML IJ SOLN
20.0000 mg | Freq: Once | INTRAMUSCULAR | Status: AC
Start: 2023-01-05 — End: 2023-01-05
  Administered 2023-01-05: 20 mg

## 2023-01-05 MED ORDER — IOHEXOL 180 MG/ML  SOLN
3.0000 mL | Freq: Once | INTRAMUSCULAR | Status: AC
  Administered 2023-01-05: 3 mL

## 2023-01-05 NOTE — Patient Instructions (Signed)

## 2023-01-05 NOTE — Progress Notes (Signed)
Bilateral L5 Lumbar transforaminal epidural steroid injection under fluoroscopic guidance with contrast enhancement  Indication: Lumbosacral radiculitis is not relieved by medication management or other conservative care and interfering with self-care and mobility.  Due to hx of pruritus with large volume ( ) Omnipaque given prior to CT abd , pt premedicated with Diphenhydramine 25mg  IM Left Glut medius area.  Pt has had prior small volume ~2mL Omnipaque injections during sacroiliac injection under fluoro without pruritus or rash   Informed consent was obtained after describing risk and benefits of the procedure with the patient, this includes bleeding, bruising, infection, paralysis and medication side effects.  The patient wishes to proceed and has given written consent.  Patient was placed in prone position.  The lumbar area was marked and prepped with Betadine.  It was entered with a 25-gauge 1-1/2 inch needle and one mL of 1% lidocaine was injected into the skin and subcutaneous tissue.  Then a 22-gauge 3.5in spinal needle was inserted into the Right L5-S1 intervertebral foramen under AP, lateral, and oblique view.  Once needle tip was within the foramen on lateral views an dnor exceeding 6 o clock position on th epedical on AP viewed Isovue 200 was inected x 2ml Then a solution containing one mL of 10 mg per mL dexamethasone and 2 mL of 1% lidocaine was injected.  The patient tolerated procedure well.  Post procedure instructions were given.  Please see post procedure form.   No pruritus 15 min after injection , pain score is 0/10

## 2023-01-10 ENCOUNTER — Other Ambulatory Visit: Payer: Self-pay | Admitting: Internal Medicine

## 2023-01-10 DIAGNOSIS — F329 Major depressive disorder, single episode, unspecified: Secondary | ICD-10-CM | POA: Diagnosis not present

## 2023-01-10 DIAGNOSIS — F411 Generalized anxiety disorder: Secondary | ICD-10-CM | POA: Diagnosis not present

## 2023-01-31 DIAGNOSIS — F411 Generalized anxiety disorder: Secondary | ICD-10-CM | POA: Diagnosis not present

## 2023-01-31 DIAGNOSIS — F329 Major depressive disorder, single episode, unspecified: Secondary | ICD-10-CM | POA: Diagnosis not present

## 2023-02-02 ENCOUNTER — Ambulatory Visit (INDEPENDENT_AMBULATORY_CARE_PROVIDER_SITE_OTHER): Payer: Medicare HMO | Admitting: Family Medicine

## 2023-02-02 ENCOUNTER — Encounter: Payer: Self-pay | Admitting: Family Medicine

## 2023-02-02 ENCOUNTER — Telehealth: Payer: Self-pay | Admitting: Internal Medicine

## 2023-02-02 VITALS — BP 128/88 | HR 67 | Temp 97.1°F | Ht 60.0 in | Wt 143.4 lb

## 2023-02-02 DIAGNOSIS — J069 Acute upper respiratory infection, unspecified: Secondary | ICD-10-CM | POA: Diagnosis not present

## 2023-02-02 DIAGNOSIS — R051 Acute cough: Secondary | ICD-10-CM | POA: Diagnosis not present

## 2023-02-02 DIAGNOSIS — J3089 Other allergic rhinitis: Secondary | ICD-10-CM

## 2023-02-02 DIAGNOSIS — J209 Acute bronchitis, unspecified: Secondary | ICD-10-CM | POA: Diagnosis not present

## 2023-02-02 DIAGNOSIS — R062 Wheezing: Secondary | ICD-10-CM | POA: Diagnosis not present

## 2023-02-02 MED ORDER — ALBUTEROL SULFATE HFA 108 (90 BASE) MCG/ACT IN AERS: INHALATION_SPRAY | RESPIRATORY_TRACT | 0 refills | Status: AC

## 2023-02-02 MED ORDER — CETIRIZINE HCL 10 MG PO TABS
10.0000 mg | ORAL_TABLET | Freq: Every day | ORAL | 1 refills | Status: DC
Start: 2023-02-02 — End: 2023-10-02

## 2023-02-02 MED ORDER — PROMETHAZINE-DM 6.25-15 MG/5ML PO SYRP
5.0000 mL | ORAL_SOLUTION | Freq: Four times a day (QID) | ORAL | 0 refills | Status: DC | PRN
Start: 2023-02-02 — End: 2023-02-26

## 2023-02-02 MED ORDER — AZITHROMYCIN 250 MG PO TABS
ORAL_TABLET | ORAL | 0 refills | Status: DC
Start: 2023-02-02 — End: 2023-05-21

## 2023-02-02 MED ORDER — HYDROCODONE BIT-HOMATROP MBR 5-1.5 MG/5ML PO SOLN
5.0000 mL | Freq: Three times a day (TID) | ORAL | 0 refills | Status: DC | PRN
Start: 2023-02-02 — End: 2023-02-02

## 2023-02-02 MED ORDER — PREDNISONE 20 MG PO TABS
40.0000 mg | ORAL_TABLET | Freq: Every day | ORAL | 0 refills | Status: AC
Start: 2023-02-02 — End: 2023-02-07

## 2023-02-02 NOTE — Telephone Encounter (Signed)
Pharmacy called and said they do not have the Hycodan cough syrup, did you want to send in something else.  Please send to [facility] on [address] [place]

## 2023-02-02 NOTE — Progress Notes (Signed)
Acute Office Visit  Subjective:     Patient ID: [name] Walker, female    DOB: 1960-12-22, 62 y.o.   MRN: [id number]  No chief complaint on file.   HPI 62 year old female presents for evaluation of cough, nasal congestion for the last 8 to 9 days. Reports that she has been around her grandchildren and they have also been sick with upper respiratory illness. States that her cough started out as dry and is now progressing to productive, worse at night. Has been taking Mucinex with little relief. Has not taken a COVID test at home.  Denies recent exposures. Denies abdominal pain, shortness of breath, nausea, vomiting, diarrhea, rash, fever, other symptoms. Medical history as outlined below.  ROS Per HPI      Objective:    BP 128/88 (BP Location: Left Arm, Patient Position: Sitting, Cuff Size: Normal)   Pulse 67   Temp (!) 97.1 F (36.2 C) (Temporal)   Ht 5' (1.524 m)   Wt 143 lb 6.4 oz (65 kg)   LMP 08/22/2010   SpO2 97%   BMI 28.01 kg/m    Physical Exam Vitals and nursing note reviewed.  Constitutional:      Comments: Appears fatigued  HENT:     Head: Normocephalic and atraumatic.     Right Ear: Tympanic membrane and ear canal normal.     Left Ear: Tympanic membrane and ear canal normal.     Nose: Nose normal. No congestion.     Mouth/Throat:     Mouth: Mucous membranes are moist.     Pharynx: Oropharynx is clear. No oropharyngeal exudate or posterior oropharyngeal erythema.  Eyes:     Extraocular Movements: Extraocular movements intact.  Cardiovascular:     Rate and Rhythm: Normal rate and regular rhythm.     Heart sounds: Normal heart sounds.  Pulmonary:     Effort: Pulmonary effort is normal. No respiratory distress.     Breath sounds: Wheezing present.  Musculoskeletal:     Cervical back: Normal range of motion and neck supple.  Lymphadenopathy:     Cervical: No cervical adenopathy.  Skin:    General: Skin is warm and dry.  Neurological:      Mental Status: She is alert.     No results found for any visits on 02/02/23.      Assessment & Plan:  1. Acute cough  - albuterol (VENTOLIN HFA) 108 (90 Base) MCG/ACT inhaler; INHALE 2 PUFFS BY MOUTH EVERY 6 HOURS AS NEEDED FOR WHEEZING FOR SHORTNESS OF BREATH  Dispense: 18 g; Refill: 0 - cetirizine (ZYRTEC) 10 MG tablet; Take 1 tablet (10 mg total) by mouth daily.  Dispense: 90 tablet; Refill: 1  2. Upper respiratory tract infection, unspecified type  - azithromycin (ZITHROMAX Z-PAK) 250 MG tablet; Take 2 tablets (500 mg) PO today, then 1 tablet (250 mg) PO daily x4 days.  Dispense: 6 tablet; Refill: 0 - cetirizine (ZYRTEC) 10 MG tablet; Take 1 tablet (10 mg total) by mouth daily.  Dispense: 90 tablet; Refill: 1  3. Acute bronchitis, unspecified organism  - predniSONE (DELTASONE) 20 MG tablet; Take 2 tablets (40 mg total) by mouth daily for 5 days.  Dispense: 10 tablet; Refill: 0 - albuterol (VENTOLIN HFA) 108 (90 Base) MCG/ACT inhaler; INHALE 2 PUFFS BY MOUTH EVERY 6 HOURS AS NEEDED FOR WHEEZING FOR SHORTNESS OF BREATH  Dispense: 18 g; Refill: 0 - cetirizine (ZYRTEC) 10 MG tablet; Take 1 tablet (10 mg total) by mouth daily.  Dispense: 90 tablet; Refill: 1  4. Wheezing  - predniSONE (DELTASONE) 20 MG tablet; Take 2 tablets (40 mg total) by mouth daily for 5 days.  Dispense: 10 tablet; Refill: 0 - albuterol (VENTOLIN HFA) 108 (90 Base) MCG/ACT inhaler; INHALE 2 PUFFS BY MOUTH EVERY 6 HOURS AS NEEDED FOR WHEEZING FOR SHORTNESS OF BREATH  Dispense: 18 g; Refill: 0 - cetirizine (ZYRTEC) 10 MG tablet; Take 1 tablet (10 mg total) by mouth daily.  Dispense: 90 tablet; Refill: 1  5. Environmental and seasonal allergies  -Continue Zyrtec once daily  -Follow-up with me for new or worsening symptoms.    Meds ordered this encounter  Medications   azithromycin (ZITHROMAX Z-PAK) 250 MG tablet    Sig: Take 2 tablets (500 mg) PO today, then 1 tablet (250 mg) PO daily x4 days.     Dispense:  6 tablet    Refill:  0   DISCONTD: HYDROcodone bit-homatropine (HYCODAN) 5-1.5 MG/5ML syrup    Sig: Take 5 mLs by mouth every 8 (eight) hours as needed for cough.    Dispense:  120 mL    Refill:  0   predniSONE (DELTASONE) 20 MG tablet    Sig: Take 2 tablets (40 mg total) by mouth daily for 5 days.    Dispense:  10 tablet    Refill:  0   albuterol (VENTOLIN HFA) 108 (90 Base) MCG/ACT inhaler    Sig: INHALE 2 PUFFS BY MOUTH EVERY 6 HOURS AS NEEDED FOR WHEEZING FOR SHORTNESS OF BREATH    Dispense:  18 g    Refill:  0    Must keep appt for future appt   cetirizine (ZYRTEC) 10 MG tablet    Sig: Take 1 tablet (10 mg total) by mouth daily.    Dispense:  90 tablet    Refill:  1    Return if symptoms worsen or fail to improve.  Moshe Cipro, FNP

## 2023-02-12 ENCOUNTER — Other Ambulatory Visit: Payer: Self-pay | Admitting: Internal Medicine

## 2023-02-26 ENCOUNTER — Telehealth: Payer: Self-pay | Admitting: Internal Medicine

## 2023-02-26 DIAGNOSIS — R051 Acute cough: Secondary | ICD-10-CM

## 2023-02-26 MED ORDER — PROMETHAZINE-DM 6.25-15 MG/5ML PO SYRP
5.0000 mL | ORAL_SOLUTION | Freq: Four times a day (QID) | ORAL | 0 refills | Status: DC | PRN
Start: 2023-02-26 — End: 2023-06-01

## 2023-02-26 NOTE — Telephone Encounter (Signed)
Copied from CRM [id number]. Topic: Clinical - Medication Refill >> Feb 26, 2023  3:57 PM Fonda Kinder J wrote: Most Recent Primary Care Visit:  Provider: Moshe Cipro  Department: [facility]  Visit Type: OFFICE VISIT  Date: 02/02/2023  Medication: promethazine-dextromethorphan (PROMETHAZINE-DM) 6.25-15 MG/5ML syrup  Has the patient contacted their pharmacy? Yes (Agent: If no, request that the patient contact the pharmacy for the refill. If patient does not wish to contact the pharmacy document the reason why and proceed with request.) (Agent: If yes, when and what did the pharmacy advise?) Pt needs a new prescription  Is this the correct pharmacy for this prescription? Yes If no, delete pharmacy and type the correct one.  This is the patient's preferred pharmacy:  [facility] [address] [place], [place] - [address] [place] [address] Phone: [phone] Fax: [phone]   Has the prescription been filled recently? Yes  Is the patient out of the medication? Yes  Has the patient been seen for an appointment in the last year OR does the patient have an upcoming appointment? Yes  Can we respond through MyChart? No  Agent: Please be advised that Rx refills may take up to 3 business days. We ask that you follow-up with your pharmacy.

## 2023-03-06 ENCOUNTER — Encounter: Payer: Self-pay | Admitting: Physical Medicine & Rehabilitation

## 2023-03-06 ENCOUNTER — Encounter: Payer: Medicare PPO | Attending: Physical Medicine and Rehabilitation | Admitting: Physical Medicine & Rehabilitation

## 2023-03-06 VITALS — BP 132/87 | HR 68 | Ht 60.0 in | Wt 149.0 lb

## 2023-03-06 DIAGNOSIS — M47817 Spondylosis without myelopathy or radiculopathy, lumbosacral region: Secondary | ICD-10-CM

## 2023-03-06 MED ORDER — ACETAMINOPHEN-CODEINE 300-60 MG PO TABS: 1.0000 | ORAL_TABLET | Freq: Two times a day (BID) | ORAL | 2 refills | Status: DC | PRN

## 2023-03-06 NOTE — Progress Notes (Signed)
 Subjective:    Patient ID: [name] Walker, female    DOB: 01/26/1961, 63 y.o.   MRN: [id number]  HPI  63 year old female with chronic lumbar pain.  She has pain both above the waist and below the waist.  Her below the waist pain has been temporary relieved with sacroiliac joint injections as well as sacroiliac RF however her insurance quit paying for sacroiliac RF and she has not had this for greater than 1 year. She has no pain shooting down the legs.  Her pain is mainly above the belt area No itching after Bilateral L5 ESI  MRI LUMBAR SPINE WITHOUT AND WITH CONTRAST   TECHNIQUE: Multiplanar and multiecho pulse sequences of the lumbar spine were obtained without and with intravenous contrast.   CONTRAST:  7 mL Vueway , 0.5 mL wasted   COMPARISON:  08/02/2018   FINDINGS: Segmentation:  Standard.   Alignment:  Grade 1 anterolisthesis of L4 on L5.   Vertebrae: No acute fracture, evidence of discitis, or aggressive bone lesion.   Conus medullaris and cauda equina: Conus extends to the T12-L1 level. Conus and cauda equina appear normal.   Paraspinal and other soft tissues: No acute paraspinal abnormality.   Disc levels:   Disc spaces: Degenerative disease with disc height loss at L5-S1.   T12-L1: No significant disc bulge. No neural foraminal stenosis. No central canal stenosis.   L1-L2: Mild broad-based disc bulge. Mild bilateral facet arthropathy. No foraminal or central canal stenosis.   L2-L3: Mild broad-based disc bulge. Mild bilateral facet arthropathy. No foraminal or central canal stenosis.   L3-L4: Mild broad-based disc bulge. Mild bilateral facet arthropathy. No foraminal or central canal stenosis.   L4-L5: Moderate disc bulge. Severe bilateral facet arthropathy with bilateral facet effusions. Mild spinal stenosis. Bilateral lateral recess stenosis. Mild bilateral foraminal stenosis.   L5-S1: Mild disc bulge with a small right paracentral  disc protrusion. Moderate bilateral facet arthropathy. Left subarticular recess stenosis. Moderate-severe left foraminal stenosis. Mild right foraminal stenosis. No spinal stenosis.   IMPRESSION: 1. At L4-5 there is a moderate disc bulge. Severe bilateral facet arthropathy with bilateral facet effusions. Mild spinal stenosis. Bilateral lateral recess stenosis. Mild bilateral foraminal stenosis. 2. At L5-S1 there is a mild disc bulge with a small right paracentral disc protrusion. Moderate bilateral facet arthropathy. Left subarticular recess stenosis. Moderate-severe left foraminal stenosis. Mild right foraminal stenosis. 3. No acute osseous injury of the lumbar spine.     Electronically Signed   By: Julaine Blanch M.D.   On: 12/18/2022 14:21 Pain Inventory Average Pain 7 Pain Right Now 7 My pain is intermittent, dull, and tingling  In the last 24 hours, has pain interfered with the following? General activity 5 Relation with others 0 Enjoyment of life 5 What TIME of day is your pain at its worst? morning , evening, and night Sleep (in general) Good  Pain is worse with: walking, bending, sitting, standing, and some activites Pain improves with: rest and medication Relief from Meds: 7  Family History  Problem Relation Age of Onset   Diabetes Mother    Dementia Mother    Alzheimer's disease Mother    Colon cancer Father        dx in his 37s   Prostate cancer Father    Colon cancer Sister        dx in her 44s   Breast cancer Maternal Aunt        50's   Esophageal cancer Neg Hx  Stomach cancer Neg Hx    Social History   Socioeconomic History   Marital status: Legally Separated    Spouse name: Les   Number of children: 3   Years of education: Not on file   Highest education level: Not on file  Occupational History   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  Tobacco Use   Smoking status: Every Day    Current packs/day: 0.50    Average packs/day: 0.5  packs/day for 20.0 years (10.0 ttl pk-yrs)    Types: Cigarettes    Passive exposure: Never   Smokeless tobacco: Never  Vaping Use   Vaping status: Never Used  Substance and Sexual Activity   Alcohol use: No   Drug use: No   Sexual activity: Not Currently    Birth control/protection: Post-menopausal, Surgical    Comment: 1st intercourse 63 yo-Fewer than 5 partners-BTL  Other Topics Concern   Not on file  Social History Narrative   Not on file   Social Drivers of Health   Financial Resource Strain: Not on file  Food Insecurity: Not on file  Transportation Needs: Not on file  Physical Activity: Not on file  Stress: Not on file  Social Connections: Unknown (07/12/2021)   Received from [facility], [facility]   Social Network    Social Network: Not on file   Past Surgical History:  Procedure Laterality Date   ANTERIOR CERVICAL DECOMP/DISCECTOMY FUSION N/A 04/07/2016   Procedure: Cervical two-three Anterior cervical decompression/discectomy/fusion;  Surgeon: Rockey Peru, MD;  Location: [facility] OR;  Service: Neurosurgery;  Laterality: N/A;   BACK SURGERY  2015   lower back   CYSTOSCOPY/URETEROSCOPY/HOLMIUM LASER/STENT PLACEMENT Right 11/21/2022   Procedure: CYSTOSCOPY RIGHT RETROGRADE PYELOGRAM RIGHT URETEROSCOPY/HOLMIUM LASER/STENT PLACEMENT;  Surgeon: Nieves Cough, MD;  Location: [facility] ORS;  Service: Urology;  Laterality: Right;  60 MINS FOR CASE   EXTRACORPOREAL SHOCK WAVE LITHOTRIPSY Left 09/28/2016   Procedure: LEFT EXTRACORPOREAL SHOCK WAVE LITHOTRIPSY (ESWL);  Surgeon: Nieves Cough, MD;  Location: [facility];  Service: Urology;  Laterality: Left;   EXTRACORPOREAL SHOCK WAVE LITHOTRIPSY Right 04/08/2018   Procedure: EXTRACORPOREAL SHOCK WAVE LITHOTRIPSY (ESWL);  Surgeon: Cam Morene ORN, MD;  Location: [facility];  Service: Urology;  Laterality: Right;   KIDNEY SURGERY     LITHOTRIPSY  05/2016   TUBAL LIGATION     Past Surgical History:  Procedure Laterality Date   ANTERIOR  CERVICAL DECOMP/DISCECTOMY FUSION N/A 04/07/2016   Procedure: Cervical two-three Anterior cervical decompression/discectomy/fusion;  Surgeon: Rockey Peru, MD;  Location: [facility] OR;  Service: Neurosurgery;  Laterality: N/A;   BACK SURGERY  2015   lower back   CYSTOSCOPY/URETEROSCOPY/HOLMIUM LASER/STENT PLACEMENT Right 11/21/2022   Procedure: CYSTOSCOPY RIGHT RETROGRADE PYELOGRAM RIGHT URETEROSCOPY/HOLMIUM LASER/STENT PLACEMENT;  Surgeon: Nieves Cough, MD;  Location: [facility];  Service: Urology;  Laterality: Right;  60 MINS FOR CASE   EXTRACORPOREAL SHOCK WAVE LITHOTRIPSY Left 09/28/2016   Procedure: LEFT EXTRACORPOREAL SHOCK WAVE LITHOTRIPSY (ESWL);  Surgeon: Nieves Cough, MD;  Location: [facility];  Service: Urology;  Laterality: Left;   EXTRACORPOREAL SHOCK WAVE LITHOTRIPSY Right 04/08/2018   Procedure: EXTRACORPOREAL SHOCK WAVE LITHOTRIPSY (ESWL);  Surgeon: Cam Morene ORN, MD;  Location: [facility];  Service: Urology;  Laterality: Right;   KIDNEY SURGERY     LITHOTRIPSY  05/2016   TUBAL LIGATION     Past Medical History:  Diagnosis Date   ACHILLES TENDINITIS    ALLERGIC RHINITIS    Allergy     Anginal pain (HCC) 04/04/2016  Pt currently feels like she is having muscle spasms and aching in her chest   Anxiety    Asthma    Chronic kidney disease    Depression    GERD    History of kidney stones    HYPERTENSION    Kidney stone    LATERAL EPICONDYLITIS, RIGHT    LOW BACK PAIN    Plantar fascial fibromatosis    Sciatica    Stress    Stroke (HCC)    SUBACROMIAL BURSITIS, RIGHT    Thoracic scoliosis 10/01/2015   TIA (transient ischemic attack)    Ht 5' (1.524 m)   Wt 149 lb (67.6 kg)   LMP 08/22/2010   BMI 29.10 kg/m   Opioid Risk Score:   Fall Risk Score:  `1  Depression screen [facility] 2/9     03/06/2023   10:56 AM 01/05/2023   10:13 AM 12/01/2022   11:02 AM 10/20/2022   10:55 AM 08/04/2022   10:59 AM 07/28/2022   10:03 AM 07/06/2022    9:32 AM  Depression screen PHQ 2/9   Decreased Interest 0 0 0 0 0 0 1  Down, Depressed, Hopeless 0 0 0 0 0 0 1  PHQ - 2 Score 0 0 0 0 0 0 2      Review of Systems  Musculoskeletal:  Positive for back pain.  All other systems reviewed and are negative.      Objective:   Physical Exam  General No acute distress Mood and affect appropriate Ambulates without assistive device no evidence of toe drag or knee instability Negative straight leg raise bilaterally Motor strength is 5/5 bilateral hip flexor knee extensor ankle dorsiflexor Sensation normal in lower extremities to light touch Primary area of pain is lumbar area L4-5 as well as S1 area bilaterally.  There is pain with lateral bending as well as forward flexion and extension.      Assessment & Plan:  #1.  Lumbar spondylosis without myelopathy reviewed MRI lumbar spine dated 11/26/2022.  The patient has axial back pain mainly above L5 no evidence of sciatica.  She has some improvement in her pain after physical therapy back in May.  Will repeat this.  I will see her back in 6 weeks and if no better schedule for bilateral medial branch blocks.  Her pain is severe 7/10 despite Tylenol  No. 4.  Continue Tylenol  No. 4 1 p.o. 3 times daily 90-day supply, PDMP reviewed, urine toxicology screen next visit

## 2023-03-13 ENCOUNTER — Other Ambulatory Visit: Payer: Self-pay | Admitting: Internal Medicine

## 2023-03-16 ENCOUNTER — Other Ambulatory Visit: Payer: Self-pay | Admitting: Internal Medicine

## 2023-03-16 NOTE — Telephone Encounter (Signed)
Copied from CRM [id number]. Topic: Clinical - Medication Refill >> Mar 16, 2023 11:46 AM Florestine Avers wrote: Most Recent Primary Care Visit:  Provider: Moshe Cipro  Department: [facility] [place]  Visit Type: OFFICE VISIT  Date: 02/02/2023  Medication: LORazepam (ATIVAN) 1 MG tablet   Has the patient contacted their pharmacy? Yes (Agent: If no, request that the patient contact the pharmacy for the refill. If patient does not wish to contact the pharmacy document the reason why and proceed with request.) (Agent: If yes, when and what did the pharmacy advise?)  Is this the correct pharmacy for this prescription? Yes If no, delete pharmacy and type the correct one.  This is the patient's preferred pharmacy:  [facility] [address] [place], [place] - [address] [place] [address] Phone: [phone] Fax: [phone]   Has the prescription been filled recently? Yes  Is the patient out of the medication? Yes  Has the patient been seen for an appointment in the last year OR does the patient have an upcoming appointment? Yes  Can we respond through MyChart? Yes  Agent: Please be advised that Rx refills may take up to 3 business days. We ask that you follow-up with your pharmacy.

## 2023-03-20 ENCOUNTER — Ambulatory Visit: Payer: Medicare PPO

## 2023-03-20 ENCOUNTER — Ambulatory Visit: Payer: Self-pay | Admitting: Internal Medicine

## 2023-03-20 MED ORDER — LORAZEPAM 1 MG PO TABS: 1.0000 mg | ORAL_TABLET | Freq: Every day | ORAL | 2 refills | Status: DC | PRN

## 2023-03-20 NOTE — Telephone Encounter (Signed)
Spoke with patient, gave a verbal understanding °

## 2023-03-20 NOTE — Telephone Encounter (Signed)
Ok this was corrected and resent erx

## 2023-03-20 NOTE — Addendum Note (Signed)
Addended by: Corwin Levins on: 03/20/2023 04:50 PM   Modules accepted: Orders

## 2023-03-20 NOTE — Telephone Encounter (Signed)
Pt called to ask why her medication was increased to two tablets daily when it was just one. Pt stated she remembered it was wearing off before the day was over. Pt sounded a little confused over the situation. RN advised pt to take medication as prescribed, but if any issues to call us back. Pt verbalized understanding. Pt stated no other concerns.           Answer Assessment - Initial Assessment Questions 1. NAME of MEDICINE: "What medicine(s) are you calling about?"     Lorazepam 2. QUESTION: "What is your question?" (e.g., double dose of medicine, side effect)     Why increase to pills today 3. PRESCRIBER: "Who prescribed the medicine?" Reason: if prescribed by specialist, call should be referred to that group.     Dr Fayrene Fearing 4. SYMPTOMS: "Do you have any symptoms?" If Yes, ask: "What symptoms are you having?"  "How bad are the symptoms (e.g., mild, moderate, severe)     na  Protocols used: Medication Question Call-A-AH

## 2023-03-20 NOTE — Telephone Encounter (Signed)
Please clarify patients Lorazepam doseage

## 2023-03-20 NOTE — Telephone Encounter (Signed)
1st attempt - LVM   Copied from CRM [id number]. Topic: Clinical - Medication Question >> Mar 20, 2023  9:59 AM Larwance Sachs wrote: Reason for CRM: Patient is stating she would like clarification from Dr. Fayrene Fearing on prescription sent in for  LORazepam (ATIVAN) 1 MG tablet regarding if anything has change

## 2023-03-21 ENCOUNTER — Ambulatory Visit: Payer: Medicare PPO

## 2023-03-21 VITALS — Ht 60.0 in | Wt 149.0 lb

## 2023-03-21 DIAGNOSIS — Z1211 Encounter for screening for malignant neoplasm of colon: Secondary | ICD-10-CM

## 2023-03-21 DIAGNOSIS — Z0000 Encounter for general adult medical examination without abnormal findings: Secondary | ICD-10-CM | POA: Diagnosis not present

## 2023-03-21 DIAGNOSIS — Z1212 Encounter for screening for malignant neoplasm of rectum: Secondary | ICD-10-CM | POA: Diagnosis not present

## 2023-03-21 NOTE — Progress Notes (Addendum)
Subjective:   [name] Walker is a 63 y.o. female who presents for an Initial Medicare Annual Wellness Visit.  Visit Complete: Virtual I connected with  [name] Walker on 03/21/2023 by a audio enabled telemedicine application and verified that I am speaking with the correct person using two identifiers.  Patient Location: Home  Provider Location: Office/Clinic  I discussed the limitations of evaluation and management by telemedicine. The patient expressed understanding and agreed to proceed.  Vital Signs: Because this visit was a virtual/telehealth visit, some criteria may be missing or patient reported. Any vitals not documented were not able to be obtained and vitals that have been documented are patient reported.   Cardiac Risk Factors include: advanced age (>62men, >72 women);dyslipidemia;hypertension     Objective:    Today's Vitals   03/21/23 1131  Weight: 149 lb (67.6 kg)  Height: 5' (1.524 m)  PainSc: 8    Body mass index is 29.1 kg/m.     03/21/2023   11:41 AM 11/21/2022    8:30 AM 11/14/2022    9:49 AM 11/01/2019   11:37 PM 10/07/2019   10:07 AM 09/27/2019    1:21 PM 09/22/2019    1:32 PM  Advanced Directives  Does Patient Have a Medical Advance Directive? No No No No No No No  Would patient like information on creating a medical advance directive? Yes (MAU/Ambulatory/Procedural Areas - Information given) No - Patient declined   No - Patient declined      Current Medications (verified) Outpatient Encounter Medications as of 03/21/2023  Medication Sig   acetaminophen-codeine (TYLENOL #4) 300-60 MG tablet Take 1 tablet by mouth 2 (two) times daily as needed for moderate pain (pain score 4-6) or severe pain (pain score 7-10).   albuterol (VENTOLIN HFA) 108 (90 Base) MCG/ACT inhaler INHALE 2 PUFFS BY MOUTH EVERY 6 HOURS AS NEEDED FOR WHEEZING FOR SHORTNESS OF BREATH   alendronate (FOSAMAX) 70 MG tablet Take 1 tablet (70 mg total) by mouth every 7 (seven) days. Take  with a full glass of water on an empty stomach.   amLODipine (NORVASC) 5 MG tablet Take 1 tablet (5 mg total) by mouth daily.   aspirin EC 81 MG tablet Take 1 tablet (81 mg total) by mouth daily. Swallow whole.   atorvastatin (LIPITOR) 20 MG tablet Take 1 tablet (20 mg total) by mouth daily.   azithromycin (ZITHROMAX Z-PAK) 250 MG tablet Take 2 tablets (500 mg) PO today, then 1 tablet (250 mg) PO daily x4 days.   cetirizine (ZYRTEC) 10 MG tablet Take 1 tablet (10 mg total) by mouth daily.   Cholecalciferol (THERA-D 2000) 50 MCG (2000 UT) TABS 1 tab by mouth once daily (Patient taking differently: Take 1,000 Units by mouth daily.)   citalopram (CELEXA) 40 MG tablet Take 1 tablet by mouth once daily   lidocaine (LIDODERM) 5 % Place 1 patch onto the skin daily. Remove & Discard patch within 12 hours or as directed by MD   LORazepam (ATIVAN) 1 MG tablet Take 1 tablet (1 mg total) by mouth daily as needed for anxiety.   losartan (COZAAR) 50 MG tablet Take 1 tablet by mouth once daily   pantoprazole (PROTONIX) 40 MG tablet Take 1 tablet (40 mg total) by mouth 2 (two) times daily before a meal.   traZODone (DESYREL) 100 MG tablet Take 1 tablet (100 mg total) by mouth at bedtime.   triamcinolone cream (KENALOG) 0.1 % Apply 1 Application topically 2 (two) times daily.  promethazine-dextromethorphan (PROMETHAZINE-DM) 6.25-15 MG/5ML syrup Take 5 mLs by mouth 4 (four) times daily as needed for cough. (Patient not taking: Reported on 03/21/2023)   No facility-administered encounter medications on file as of 03/21/2023.    Allergies (verified) Flexeril [cyclobenzaprine], Lisinopril, Other, Robaxin [methocarbamol], Contrast media [iodinated contrast media], Iodine, Gabapentin, and Pregabalin   History: Past Medical History:  Diagnosis Date   ACHILLES TENDINITIS    ALLERGIC RHINITIS    Allergy    Anginal pain (HCC) 04/04/2016   Pt currently feels like she is having muscle spasms and aching in her chest    Anxiety    Asthma    Chronic kidney disease    Depression    GERD    History of kidney stones    HYPERTENSION    Kidney stone    LATERAL EPICONDYLITIS, RIGHT    LOW BACK PAIN    Plantar fascial fibromatosis    Sciatica    Stress    Stroke (HCC)    SUBACROMIAL BURSITIS, RIGHT    Thoracic scoliosis 10/01/2015   TIA (transient ischemic attack)    Past Surgical History:  Procedure Laterality Date   ANTERIOR CERVICAL DECOMP/DISCECTOMY FUSION N/A 04/07/2016   Procedure: Cervical two-three Anterior cervical decompression/discectomy/fusion;  Surgeon: Coletta Memos, MD;  Location: [facility] OR;  Service: Neurosurgery;  Laterality: N/A;   BACK SURGERY  2015   lower back   CYSTOSCOPY/URETEROSCOPY/HOLMIUM LASER/STENT PLACEMENT Right 11/21/2022   Procedure: CYSTOSCOPY RIGHT RETROGRADE PYELOGRAM RIGHT URETEROSCOPY/HOLMIUM LASER/STENT PLACEMENT;  Surgeon: Jerilee Field, MD;  Location: [facility] ORS;  Service: Urology;  Laterality: Right;  60 MINS FOR CASE   EXTRACORPOREAL SHOCK WAVE LITHOTRIPSY Left 09/28/2016   Procedure: LEFT EXTRACORPOREAL SHOCK WAVE LITHOTRIPSY (ESWL);  Surgeon: Jerilee Field, MD;  Location: [facility] OR[facility];  Service: Urology;  Laterality: Left;   EXTRACORPOREAL SHOCK WAVE LITHOTRIPSY Right 04/08/2018   Procedure: EXTRACORPOREAL SHOCK WAVE LITHOTRIPSY (ESWL);  Surgeon: Crist Fat, MD;  Location: [facility];  Service: Urology;  Laterality: Right;   KIDNEY SURGERY     LITHOTRIPSY  05/2016   TUBAL LIGATION     Family History  Problem Relation Age of Onset   Diabetes Mother    Dementia Mother    Alzheimer's disease Mother    Colon cancer Father        dx in his 63s   Prostate cancer Father    Colon cancer Sister        dx in her 7s   Breast cancer Maternal Aunt        50's   Esophageal cancer Neg Hx    Stomach cancer Neg Hx    Social History   Socioeconomic History   Marital status: Legally Separated    Spouse name: Les   Number of children: 3   Years of education: Not on  file   Highest education level: Not on file  Occupational History   Occupation: DISABLED/assistant GC  school bus driver  Tobacco Use   Smoking status: Every Day    Current packs/day: 0.50    Average packs/day: 0.5 packs/day for 20.0 years (10.0 ttl pk-yrs)    Types: Cigarettes    Passive exposure: Never   Smokeless tobacco: Never  Vaping Use   Vaping status: Never Used  Substance and Sexual Activity   Alcohol use: No   Drug use: No   Sexual activity: Not Currently    Birth control/protection: Post-menopausal, Surgical    Comment: 1st intercourse 63 yo-Fewer than 5 partners-BTL  Other Topics Concern  Not on file  Social History Narrative   Lives with her sister-2025   Social Drivers of Health   Financial Resource Strain: Low Risk  (03/21/2023)   Overall Financial Resource Strain (CARDIA)    Difficulty of Paying Living Expenses: Not hard at all  Food Insecurity: No Food Insecurity (03/21/2023)   Hunger Vital Sign    Worried About Running Out of Food in the Last Year: Never true    Ran Out of Food in the Last Year: Never true  Transportation Needs: No Transportation Needs (03/21/2023)   PRAPARE - Administrator, Civil Service (Medical): No    Lack of Transportation (Non-Medical): No  Physical Activity: Inactive (03/21/2023)   Exercise Vital Sign    Days of Exercise per Week: 0 days    Minutes of Exercise per Session: 0 min  Stress: No Stress Concern Present (03/21/2023)   [employer] of Occupational Health - Occupational Stress Questionnaire    Feeling of Stress : Not at all  Social Connections: Moderately Isolated (03/21/2023)   Social Connection and Isolation Panel [NHANES]    Frequency of Communication with Friends and Family: More than three times a week    Frequency of Social Gatherings with Friends and Family: Once a week    Attends Religious Services: More than 4 times per year    Active Member of [employer] or Organizations: No    Attends Tax inspector Meetings: Never    Marital Status: Separated    Tobacco Counseling Ready to quit: Not Answered Counseling given: Not Answered   Clinical Intake:  Pre-visit preparation completed: Yes  Pain : 0-10 Pain Score: 8  Pain Type: Chronic pain Pain Location: Back Pain Descriptors / Indicators: Aching, Discomfort Pain Onset: More than a month ago Pain Frequency: Intermittent Pain Relieving Factors: Tylenol codine  Pain Relieving Factors: Tylenol codine  BMI - recorded: 29.1 Nutritional Status: BMI 25 -29 Overweight Nutritional Risks: None Diabetes: No  How often do you need to have someone help you when you read instructions, pamphlets, or other written materials from your doctor or pharmacy?: 1 - Never  Interpreter Needed?: No  Information entered by :: Kanan Sobek, RMA   Activities of Daily Living    03/21/2023   11:31 AM 11/14/2022    9:52 AM  In your present state of health, do you have any difficulty performing the following activities:  Hearing? 0   Vision? 0   Difficulty concentrating or making decisions? 0   Walking or climbing stairs? 0   Dressing or bathing? 0   Doing errands, shopping? 0 0  Preparing Food and eating ? N   Using the Toilet? N   In the past six months, have you accidently leaked urine? N   Do you have problems with loss of bowel control? N   Managing your Medications? N   Managing your Finances? N   Housekeeping or managing your Housekeeping? N     Patient Care Team: Corwin Levins, MD as PCP - General  Indicate any recent Medical Services you may have received from other than [facility] providers in the past year (date may be approximate).     Assessment:   This is a routine wellness examination for [place].  Hearing/Vision screen Hearing Screening - Comments:: Denies hearing difficulties   Vision Screening - Comments:: Wears eyeglasses for reading   Goals Addressed               This Visit's Progress  Patient  Stated (pt-stated)        Would like get her back straight       Depression Screen    03/21/2023   11:47 AM 03/06/2023   10:56 AM 01/05/2023   10:13 AM 12/01/2022   11:02 AM 10/20/2022   10:55 AM 08/04/2022   10:59 AM 07/28/2022   10:03 AM  PHQ 2/9 Scores  PHQ - 2 Score 0 0 0 0 0 0 0  PHQ- 9 Score 3          Fall Risk    03/21/2023   11:42 AM 03/06/2023   10:56 AM 01/05/2023   10:13 AM 12/01/2022   11:02 AM 10/20/2022   11:29 AM  Fall Risk   Falls in the past year? 1 0 0 0 0  Number falls in past yr: 0  0 0 0  Injury with Fall? 0  0    Risk for fall due to : No Fall Risks      Follow up Falls prevention discussed;Falls evaluation completed        MEDICARE RISK AT HOME: Medicare Risk at Home Any stairs in or around the home?: Yes (in front) If so, are there any without handrails?: Yes Home free of loose throw rugs in walkways, pet beds, electrical cords, etc?: Yes Adequate lighting in your home to reduce risk of falls?: Yes Life alert?: No Use of a cane, walker or w/c?: No Grab bars in the bathroom?: Yes Shower chair or bench in shower?: Yes Elevated toilet seat or a handicapped toilet?: Yes  TIMED UP AND GO:  Was the test performed? No    Cognitive Function:        03/21/2023   11:31 AM  6CIT Screen  What Year? 0 points  What month? 0 points  What time? 0 points  Count back from 20 0 points  Months in reverse 4 points  Repeat phrase 4 points  Total Score 8 points    Immunizations Immunization History  Administered Date(s) Administered   Influenza, Quadrivalent, Recombinant, Inj, Pf 12/31/2018   Influenza,inj,Quad PF,6+ Mos 03/02/2020   PFIZER(Purple Top)SARS-COV-2 Vaccination 04/26/2019, 05/17/2019   Pneumococcal Polysaccharide-23 01/20/2019   Tdap 11/02/2019   Zoster Recombinant(Shingrix) 01/20/2019, 03/22/2019    TDAP status: Up to date  Flu Vaccine status: Due, Education has been provided regarding the importance of this vaccine. Advised may receive  this vaccine at local pharmacy or Health Dept. Aware to provide a copy of the vaccination record if obtained from local pharmacy or Health Dept. Verbalized acceptance and understanding.  Pneumococcal vaccine status: Due, Education has been provided regarding the importance of this vaccine. Advised may receive this vaccine at local pharmacy or Health Dept. Aware to provide a copy of the vaccination record if obtained from local pharmacy or Health Dept. Verbalized acceptance and understanding.  Covid-19 vaccine status: Declined, Education has been provided regarding the importance of this vaccine but patient still declined. Advised may receive this vaccine at local pharmacy or Health Dept.or vaccine clinic. Aware to provide a copy of the vaccination record if obtained from local pharmacy or Health Dept. Verbalized acceptance and understanding.  Qualifies for Shingles Vaccine? Yes   Zostavax completed Yes   Shingrix Completed?: Yes  Screening Tests Health Maintenance  Topic Date Due   Pneumococcal Vaccine 18-88 Years old (2 of 2 - PCV) 01/20/2020   Colonoscopy  11/21/2022   INFLUENZA VACCINE  05/28/2023 (Originally 09/28/2022)   Medicare Annual Wellness (AWV)  03/20/2024  MAMMOGRAM  12/20/2024   Cervical Cancer Screening (HPV/Pap Cotest)  12/05/2027   DTaP/Tdap/Td (2 - Td or Tdap) 11/01/2029   Hepatitis C Screening  Completed   HIV Screening  Completed   Zoster Vaccines- Shingrix  Completed   HPV VACCINES  Aged Out   COVID-19 Vaccine  Discontinued    Health Maintenance  Health Maintenance Due  Topic Date Due   Pneumococcal Vaccine 25-64 Years old (2 of 2 - PCV) 01/20/2020   Colonoscopy  11/21/2022    Colorectal cancer screening: Referral to GI placed 03/21/2023. Pt aware the office will call re: appt.  Mammogram status: Completed 12/21/2022. Repeat every year   Lung Cancer Screening: (Low Dose CT Chest recommended if Age 1-80 years, 20 pack-year currently smoking OR have quit  w/in 15years.) does not qualify.   Lung Cancer Screening Referral: N/A  Additional Screening:  Hepatitis C Screening: does qualify; Completed 10/01/2015  Vision Screening: Recommended annual ophthalmology exams for early detection of glaucoma and other disorders of the eye. Is the patient up to date with their annual eye exam?  No  Who is the provider or what is the name of the office in which the patient attends annual eye exams? NEED referral  If pt is not established with a provider, would they like to be referred to a provider to establish care? Yes .   Dental Screening: Recommended annual dental exams for proper oral hygiene   Community Resource Referral / Chronic Care Management: CRR required this visit?  No   CCM required this visit?  No     Plan:     I have personally reviewed and noted the following in the patient's chart:   Medical and social history Use of alcohol, tobacco or illicit drugs  Current medications and supplements including opioid prescriptions. Patient is currently taking opioid prescriptions. Information provided to patient regarding non-opioid alternatives. Patient advised to discuss non-opioid treatment plan with their provider. Functional ability and status Nutritional status Physical activity Advanced directives List of other physicians Hospitalizations, surgeries, and ER visits in previous 12 months Vitals Screenings to include cognitive, depression, and falls Referrals and appointments  In addition, I have reviewed and discussed with patient certain preventive protocols, quality metrics, and best practice recommendations. A written personalized care plan for preventive services as well as general preventive health recommendations were provided to patient.     Codi Kertz L Kasheena Sambrano, CMA   03/23/2023   After Visit Summary: (MyChart) Due to this being a telephonic visit, the after visit summary with patients personalized plan was offered to patient  via MyChart   Nurse Notes: Patient is due for a Pneumonia vaccine, a Flu vaccine and a colonoscopy.  Order has been placed for the colonoscopy today.  Patient also is requesting a referral to an eye doctor for an eye exam.  She had no other concerns to address today.

## 2023-03-21 NOTE — Addendum Note (Signed)
Addended by: Wyvonne Lenz on: 03/21/2023 03:22 PM   Modules accepted: Level of Service

## 2023-03-21 NOTE — Patient Instructions (Signed)
[name] Walker , Thank you for taking time to come for your Medicare Wellness Visit. I appreciate your ongoing commitment to your health goals. Please review the following plan we discussed and let me know if I can assist you in the future.   Referrals/Orders/Follow-Ups/Clinician Recommendations: You are due for a Flu and Pneumonia vaccine.  You have an order placed for a colonoscopy.  Please call  [facility], at [phone].  It was nice talking to you today.  Aim for 30 minutes of exercise or brisk walking, 6-8 glasses of water, and 5 servings of fruits and vegetables each day.   This is a list of the screening recommended for you and due dates:  Health Maintenance  Topic Date Due   Pneumococcal Vaccination (2 of 2 - PCV) 01/20/2020   Colon Cancer Screening  11/21/2022   Flu Shot  05/28/2023*   Medicare Annual Wellness Visit  03/20/2024   Mammogram  12/20/2024   Pap with HPV screening  12/05/2027   DTaP/Tdap/Td vaccine (2 - Td or Tdap) 11/01/2029   Hepatitis C Screening  Completed   HIV Screening  Completed   Zoster (Shingles) Vaccine  Completed   HPV Vaccine  Aged Out   COVID-19 Vaccine  Discontinued  *Topic was postponed. The date shown is not the original due date.    Advanced directives: (Provided) Advance directive discussed with you today. I have provided a copy for you to complete at home and have notarized. Once this is complete, please bring a copy in to our office so we can scan it into your chart.   Next Medicare Annual Wellness Visit scheduled for next year: Yes

## 2023-04-17 ENCOUNTER — Encounter: Payer: Medicare PPO | Admitting: Physical Medicine & Rehabilitation

## 2023-05-04 ENCOUNTER — Encounter: Payer: Medicare PPO | Attending: Physical Medicine and Rehabilitation | Admitting: Physical Medicine & Rehabilitation

## 2023-05-04 ENCOUNTER — Encounter: Payer: Self-pay | Admitting: Physical Medicine & Rehabilitation

## 2023-05-04 VITALS — BP 124/84 | HR 76 | Ht 60.0 in | Wt 154.0 lb

## 2023-05-04 DIAGNOSIS — Z79891 Long term (current) use of opiate analgesic: Secondary | ICD-10-CM | POA: Diagnosis not present

## 2023-05-04 DIAGNOSIS — G894 Chronic pain syndrome: Secondary | ICD-10-CM | POA: Diagnosis not present

## 2023-05-04 DIAGNOSIS — Z5181 Encounter for therapeutic drug level monitoring: Secondary | ICD-10-CM | POA: Insufficient documentation

## 2023-05-04 MED ORDER — ACETAMINOPHEN-CODEINE 300-60 MG PO TABS
1.0000 | ORAL_TABLET | ORAL | 2 refills | Status: DC | PRN
Start: 2023-05-04 — End: 2023-09-06

## 2023-05-04 NOTE — Progress Notes (Signed)
 Subjective:    Patient ID: [name] Walker, female    DOB: May 07, 1960, 63 y.o.   MRN: [id number]  HPI 63 year old female with history of chronic low back pain.  She has had good response to sacroiliac radiofrequency procedures for low back and buttock pain but because of insurance changes she was not able to continue these procedures.  She also has had intermittent radicular pain and has had good relief with L5 transforaminal lumbar epidural steroid injections last performed in November 2024.  While she has occasional right lower extremity radicular type pain this is fairly rare.  Her main complaint is low back pain Has been doing HEP (prescribed by PT) at home 4 x per week over the last 2 months  Pain Inventory Average Pain 10 Pain Right Now 10 My pain is constant, sharp, burning, dull, stabbing, tingling, and aching  In the last 24 hours, has pain interfered with the following? General activity 10 Relation with others 10 Enjoyment of life 10 What TIME of day is your pain at its worst? morning , daytime, evening, and night Sleep (in general) Good  Pain is worse with: walking, bending, sitting, inactivity, and standing Pain improves with: rest, medication, TENS, injections, and heat Relief from Meds: 5  Family History  Problem Relation Age of Onset   Diabetes Mother    Dementia Mother    Alzheimer's disease Mother    Colon cancer Father        dx in his 15s   Prostate cancer Father    Colon cancer Sister        dx in her 20s   Breast cancer Maternal Aunt        50's   Esophageal cancer Neg Hx    Stomach cancer Neg Hx    Social History   Socioeconomic History   Marital status: Legally Separated    Spouse name: Les   Number of children: 3   Years of education: Not on file   Highest education level: Not on file  Occupational History   Occupation: DISABLED/assistant GC  school bus driver  Tobacco Use   Smoking status: Every Day    Current packs/day: 0.50    Average  packs/day: 0.5 packs/day for 20.0 years (10.0 ttl pk-yrs)    Types: Cigarettes    Passive exposure: Never   Smokeless tobacco: Never  Vaping Use   Vaping status: Never Used  Substance and Sexual Activity   Alcohol use: No   Drug use: No   Sexual activity: Not Currently    Birth control/protection: Post-menopausal, Surgical    Comment: 1st intercourse 63 yo-Fewer than 5 partners-BTL  Other Topics Concern   Not on file  Social History Narrative   Lives with her sister-2025   Social Drivers of Health   Financial Resource Strain: Low Risk  (03/21/2023)   Overall Financial Resource Strain (CARDIA)    Difficulty of Paying Living Expenses: Not hard at all  Food Insecurity: No Food Insecurity (03/21/2023)   Hunger Vital Sign    Worried About Running Out of Food in the Last Year: Never true    Ran Out of Food in the Last Year: Never true  Transportation Needs: No Transportation Needs (03/21/2023)   PRAPARE - Administrator, Civil Service (Medical): No    Lack of Transportation (Non-Medical): No  Physical Activity: Inactive (03/21/2023)   Exercise Vital Sign    Days of Exercise per Week: 0 days    Minutes of  Exercise per Session: 0 min  Stress: No Stress Concern Present (03/21/2023)   [employer] of Occupational Health - Occupational Stress Questionnaire    Feeling of Stress : Not at all  Social Connections: Moderately Isolated (03/21/2023)   Social Connection and Isolation Panel [NHANES]    Frequency of Communication with Friends and Family: More than three times a week    Frequency of Social Gatherings with Friends and Family: Once a week    Attends Religious Services: More than 4 times per year    Active Member of [employer] or Organizations: No    Attends Banker Meetings: Never    Marital Status: Separated   Past Surgical History:  Procedure Laterality Date   ANTERIOR CERVICAL DECOMP/DISCECTOMY FUSION N/A 04/07/2016   Procedure: Cervical two-three Anterior  cervical decompression/discectomy/fusion;  Surgeon: Coletta Memos, MD;  Location: [facility] OR;  Service: Neurosurgery;  Laterality: N/A;   BACK SURGERY  2015   lower back   CYSTOSCOPY/URETEROSCOPY/HOLMIUM LASER/STENT PLACEMENT Right 11/21/2022   Procedure: CYSTOSCOPY RIGHT RETROGRADE PYELOGRAM RIGHT URETEROSCOPY/HOLMIUM LASER/STENT PLACEMENT;  Surgeon: Jerilee Field, MD;  Location: [facility];  Service: Urology;  Laterality: Right;  60 MINS FOR CASE   EXTRACORPOREAL SHOCK WAVE LITHOTRIPSY Left 09/28/2016   Procedure: LEFT EXTRACORPOREAL SHOCK WAVE LITHOTRIPSY (ESWL);  Surgeon: Jerilee Field, MD;  Location: [facility];  Service: Urology;  Laterality: Left;   EXTRACORPOREAL SHOCK WAVE LITHOTRIPSY Right 04/08/2018   Procedure: EXTRACORPOREAL SHOCK WAVE LITHOTRIPSY (ESWL);  Surgeon: Crist Fat, MD;  Location: [facility];  Service: Urology;  Laterality: Right;   KIDNEY SURGERY     LITHOTRIPSY  05/2016   TUBAL LIGATION     Past Surgical History:  Procedure Laterality Date   ANTERIOR CERVICAL DECOMP/DISCECTOMY FUSION N/A 04/07/2016   Procedure: Cervical two-three Anterior cervical decompression/discectomy/fusion;  Surgeon: Coletta Memos, MD;  Location: [facility] OR;  Service: Neurosurgery;  Laterality: N/A;   BACK SURGERY  2015   lower back   CYSTOSCOPY/URETEROSCOPY/HOLMIUM LASER/STENT PLACEMENT Right 11/21/2022   Procedure: CYSTOSCOPY RIGHT RETROGRADE PYELOGRAM RIGHT URETEROSCOPY/HOLMIUM LASER/STENT PLACEMENT;  Surgeon: Jerilee Field, MD;  Location: [facility];  Service: Urology;  Laterality: Right;  60 MINS FOR CASE   EXTRACORPOREAL SHOCK WAVE LITHOTRIPSY Left 09/28/2016   Procedure: LEFT EXTRACORPOREAL SHOCK WAVE LITHOTRIPSY (ESWL);  Surgeon: Jerilee Field, MD;  Location: [facility];  Service: Urology;  Laterality: Left;   EXTRACORPOREAL SHOCK WAVE LITHOTRIPSY Right 04/08/2018   Procedure: EXTRACORPOREAL SHOCK WAVE LITHOTRIPSY (ESWL);  Surgeon: Crist Fat, MD;  Location: [facility];  Service: Urology;   Laterality: Right;   KIDNEY SURGERY     LITHOTRIPSY  05/2016   TUBAL LIGATION     Past Medical History:  Diagnosis Date   ACHILLES TENDINITIS    ALLERGIC RHINITIS    Allergy    Anginal pain (HCC) 04/04/2016   Pt currently feels like she is having muscle spasms and aching in her chest   Anxiety    Asthma    Chronic kidney disease    Depression    GERD    History of kidney stones    HYPERTENSION    Kidney stone    LATERAL EPICONDYLITIS, RIGHT    LOW BACK PAIN    Plantar fascial fibromatosis    Sciatica    Stress    Stroke (HCC)    SUBACROMIAL BURSITIS, RIGHT    Thoracic scoliosis 10/01/2015   TIA (transient ischemic attack)    LMP 08/22/2010   Opioid Risk Score:   Fall Risk Score:  `  1  Depression screen [facility] 2/9     03/21/2023   11:47 AM 03/06/2023   10:56 AM 01/05/2023   10:13 AM 12/01/2022   11:02 AM 10/20/2022   10:55 AM 08/04/2022   10:59 AM 07/28/2022   10:03 AM  Depression screen PHQ 2/9  Decreased Interest 0 0 0 0 0 0 0  Down, Depressed, Hopeless 0 0 0 0 0 0 0  PHQ - 2 Score 0 0 0 0 0 0 0  Altered sleeping 1        Tired, decreased energy 2        Change in appetite 0        Feeling bad or failure about yourself  0        Trouble concentrating 0        Moving slowly or fidgety/restless 0        Suicidal thoughts 0        PHQ-9 Score 3        Difficult doing work/chores Not difficult at all          Review of Systems  Musculoskeletal:  Positive for back pain.       Pain in both hips, pain right leg  All other systems reviewed and are negative.      Objective:   Physical Exam   General No acute distress Mood and affect appropriate Extremities without edema Negative straight leg raise bilaterally Sensation intact to light touch bilateral lower extremities Deep tendon reflexes 2+ bilateral knees 1+ left ankle 2+ right ankle  Sacral thrust (prone) : Positive bilateral Lateral compression: Negative FABER's: Negative Distraction (supine):  Negative Thigh thrust test: Positive left Has tenderness palpation around the L4 and L5 region bilaterally. Lumbar range of motion 75% flexion extension without increasing pain. Ambulates without assistive device no evidence of toe drag or knee instability    Assessment & Plan:  1.  Lumbar spondylosis without myelopathy is most likely pain generator at this time reviewed most recent MRI of the lumbar spine from September 2024.  She has failed conservative care including 4 days a week, exercise program from physical therapy.  Will schedule for bilateral L3-L4 medial branch and L5 dorsal ramus injection under fluoroscopic guidance no contrast due to itching with contrast 2.  Lumbar spinal stenosis mainly affecting 5 S1 level at this point her radicular pain is quite rare therefore do not think she needs repeat ESI 3.  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

## 2023-05-08 ENCOUNTER — Telehealth: Payer: Self-pay

## 2023-05-08 NOTE — Telephone Encounter (Signed)
 Approved today by [facility] NCPDP [id number] Your request has been approved Effective Date: 04/28/2023 Authorization Expiration Date: 02/27/2024

## 2023-05-08 NOTE — Telephone Encounter (Signed)
 PA FOR TYLENOL #4 created in Cover My Meds

## 2023-05-10 LAB — TOXASSURE SELECT,+ANTIDEPR,UR

## 2023-05-18 ENCOUNTER — Encounter: Payer: Self-pay | Admitting: Gastroenterology

## 2023-05-21 ENCOUNTER — Telehealth (INDEPENDENT_AMBULATORY_CARE_PROVIDER_SITE_OTHER): Admitting: Internal Medicine

## 2023-05-21 ENCOUNTER — Encounter: Payer: Self-pay | Admitting: Internal Medicine

## 2023-05-21 DIAGNOSIS — J069 Acute upper respiratory infection, unspecified: Secondary | ICD-10-CM

## 2023-05-21 DIAGNOSIS — J45909 Unspecified asthma, uncomplicated: Secondary | ICD-10-CM

## 2023-05-21 DIAGNOSIS — J309 Allergic rhinitis, unspecified: Secondary | ICD-10-CM

## 2023-05-21 MED ORDER — LORAZEPAM 1 MG PO TABS: 1.0000 mg | ORAL_TABLET | Freq: Two times a day (BID) | ORAL | 2 refills | Status: DC | PRN

## 2023-05-21 MED ORDER — HYDROCODONE BIT-HOMATROP MBR 5-1.5 MG/5ML PO SOLN: 5.0000 mL | Freq: Four times a day (QID) | ORAL | 0 refills | Status: AC | PRN

## 2023-05-21 MED ORDER — AZITHROMYCIN 250 MG PO TABS
ORAL_TABLET | ORAL | 1 refills | Status: DC
Start: 2023-05-21 — End: 2023-06-01

## 2023-05-21 MED ORDER — PREDNISONE 10 MG PO TABS: ORAL_TABLET | ORAL | 0 refills | Status: DC

## 2023-05-21 NOTE — Assessment & Plan Note (Signed)
 Mild to mod, for also prednisone taper,  to f/u any worsening symptoms or concerns

## 2023-05-21 NOTE — Assessment & Plan Note (Signed)
Stable, cont current med tx

## 2023-05-21 NOTE — Progress Notes (Signed)
 Patient ID: [name] Walker, female   DOB: 02-May-1960, 63 y.o.   MRN: [id number]  Virtual Visit via Video Note  I connected with [name] Walker on 05/21/23 at  1:20 PM EDT by a video enabled telemedicine application and verified that I am speaking with the correct person using two identifiers.  Location of all partiicpants today Patient: at home Provider: at office   I discussed the limitations of evaluation and management by telemedicine and the availability of in person appointments. The patient expressed understanding and agreed to proceed.  History of Present Illness:  Here with 2-3 days acute onset fever, facial pain, pressure, headache, general weakness and malaise, and greenish d/c, with mild ST and cough, but pt denies chest pain, wheezing, increased sob or doe, orthopnea, PND, increased LE swelling, palpitations, dizziness or syncope.  Does have several wks ongoing nasal allergy symptoms with clearish congestion, itch and sneezing, without fever, pain, ST, cough, swelling or wheezing.   Pt denies polydipsia, polyuria, or new focal neuro s/s.    Pt denies other wt loss, night sweats, loss of appetite, or other constitutional symptoms    Past Medical History:  Diagnosis Date   ACHILLES TENDINITIS    ALLERGIC RHINITIS    Allergy    Anginal pain (HCC) 04/04/2016   Pt currently feels like she is having muscle spasms and aching in her chest   Anxiety    Asthma    Chronic kidney disease    Depression    GERD    History of kidney stones    HYPERTENSION    Kidney stone    LATERAL EPICONDYLITIS, RIGHT    LOW BACK PAIN    Plantar fascial fibromatosis    Sciatica    Stress    Stroke (HCC)    SUBACROMIAL BURSITIS, RIGHT    Thoracic scoliosis 10/01/2015   TIA (transient ischemic attack)    Past Surgical History:  Procedure Laterality Date   ANTERIOR CERVICAL DECOMP/DISCECTOMY FUSION N/A 04/07/2016   Procedure: Cervical two-three Anterior cervical decompression/discectomy/fusion;   Surgeon: Coletta Memos, MD;  Location: [facility] OR;  Service: Neurosurgery;  Laterality: N/A;   BACK SURGERY  2015   lower back   CYSTOSCOPY/URETEROSCOPY/HOLMIUM LASER/STENT PLACEMENT Right 11/21/2022   Procedure: CYSTOSCOPY RIGHT RETROGRADE PYELOGRAM RIGHT URETEROSCOPY/HOLMIUM LASER/STENT PLACEMENT;  Surgeon: Jerilee Field, MD;  Location: [facility];  Service: Urology;  Laterality: Right;  60 MINS FOR CASE   EXTRACORPOREAL SHOCK WAVE LITHOTRIPSY Left 09/28/2016   Procedure: LEFT EXTRACORPOREAL SHOCK WAVE LITHOTRIPSY (ESWL);  Surgeon: Jerilee Field, MD;  Location: [facility];  Service: Urology;  Laterality: Left;   EXTRACORPOREAL SHOCK WAVE LITHOTRIPSY Right 04/08/2018   Procedure: EXTRACORPOREAL SHOCK WAVE LITHOTRIPSY (ESWL);  Surgeon: Crist Fat, MD;  Location: [facility];  Service: Urology;  Laterality: Right;   KIDNEY SURGERY     LITHOTRIPSY  05/2016   TUBAL LIGATION      reports that she has been smoking cigarettes. She has a 10 pack-year smoking history. She has never been exposed to tobacco smoke. She has never used smokeless tobacco. She reports that she does not drink alcohol and does not use drugs. family history includes Alzheimer's disease in her mother; Breast cancer in her maternal aunt; Colon cancer in her father and sister; Dementia in her mother; Diabetes in her mother; Prostate cancer in her father. Allergies  Allergen Reactions   Flexeril [Cyclobenzaprine] Swelling    Tongue swells   Lisinopril Other (See Comments)    Possible tongue swelling  Other Palpitations    ALL MUSCLE RELAXERS-PER PATIENT   Robaxin [Methocarbamol] Other (See Comments)    Tongue swelling   Contrast Media [Iodinated Contrast Media] Itching   Iodine Itching   Gabapentin Other (See Comments)   Pregabalin Anxiety and Swelling    Anxiety attacks   Current Outpatient Medications on File Prior to Visit  Medication Sig Dispense Refill   acetaminophen-codeine (TYLENOL #4) 300-60 MG tablet Take 1 tablet  by mouth every 4 (four) hours as needed for moderate pain (pain score 4-6). 60 tablet 2   albuterol (VENTOLIN HFA) 108 (90 Base) MCG/ACT inhaler INHALE 2 PUFFS BY MOUTH EVERY 6 HOURS AS NEEDED FOR WHEEZING FOR SHORTNESS OF BREATH 18 g 0   alendronate (FOSAMAX) 70 MG tablet Take 1 tablet (70 mg total) by mouth every 7 (seven) days. Take with a full glass of water on an empty stomach. 12 tablet 3   amLODipine (NORVASC) 5 MG tablet Take 1 tablet (5 mg total) by mouth daily. 90 tablet 2   aspirin EC 81 MG tablet Take 1 tablet (81 mg total) by mouth daily. Swallow whole. 30 tablet 11   atorvastatin (LIPITOR) 20 MG tablet Take 1 tablet (20 mg total) by mouth daily. 90 tablet 3   cetirizine (ZYRTEC) 10 MG tablet Take 1 tablet (10 mg total) by mouth daily. 90 tablet 1   Cholecalciferol (THERA-D 2000) 50 MCG (2000 UT) TABS 1 tab by mouth once daily (Patient taking differently: Take 1,000 Units by mouth daily.) 30 tablet 99   citalopram (CELEXA) 40 MG tablet Take 1 tablet by mouth once daily 90 tablet 2   lidocaine (LIDODERM) 5 % Place 1 patch onto the skin daily. Remove & Discard patch within 12 hours or as directed by MD 30 patch 2   losartan (COZAAR) 50 MG tablet Take 1 tablet by mouth once daily 90 tablet 3   pantoprazole (PROTONIX) 40 MG tablet Take 1 tablet (40 mg total) by mouth 2 (two) times daily before a meal. 180 tablet 3   promethazine-dextromethorphan (PROMETHAZINE-DM) 6.25-15 MG/5ML syrup Take 5 mLs by mouth 4 (four) times daily as needed for cough. 118 mL 0   traZODone (DESYREL) 100 MG tablet Take 1 tablet (100 mg total) by mouth at bedtime. 90 tablet 1   triamcinolone cream (KENALOG) 0.1 % Apply 1 Application topically 2 (two) times daily. 30 g 0   No current facility-administered medications on file prior to visit.   Observations/Objective: Alert, mild ill appearing, appropriate mood and affect, resps normal, cn 2-12 intact, moves all 4s, no visible rash or swelling Lab Results  Component  Value Date   WBC 5.5 11/14/2022   HGB 14.4 11/14/2022   HCT 44.9 11/14/2022   PLT 132 (L) 11/14/2022   GLUCOSE 87 11/14/2022   CHOL 170 07/28/2022   TRIG 203.0 (H) 07/28/2022   HDL 40.70 07/28/2022   LDLDIRECT 103.0 07/28/2022   LDLCALC 46 06/30/2020   ALT 21 07/28/2022   AST 21 07/28/2022   NA 142 11/14/2022   K 3.9 11/14/2022   CL 110 11/14/2022   CREATININE 0.63 11/14/2022   BUN 14 11/14/2022   CO2 23 11/14/2022   TSH 0.43 07/28/2022   INR 0.9 09/30/2019   HGBA1C 5.0 07/28/2022   Assessment and Plan: See notes  Follow Up Instructions: See notes   I discussed the assessment and treatment plan with the patient. The patient was provided an opportunity to ask questions and all were answered. The patient agreed  with the plan and demonstrated an understanding of the instructions.   The patient was advised to call back or seek an in-person evaluation if the symptoms worsen or if the condition fails to improve as anticipated.   Oliver Barre, MD

## 2023-05-21 NOTE — Assessment & Plan Note (Signed)
 Mild to mod, for antibx course zpack and cough med prn,  to f/u any worsening symptoms or concerns

## 2023-05-21 NOTE — Patient Instructions (Signed)
 Please take all new medication as prescribed

## 2023-05-28 NOTE — Progress Notes (Deleted)
  PROCEDURE RECORD [facility]   Name: [name] Walker DOB:Jan 19, 1961 MRN: [id number]  Date:05/28/2023  Physician: Claudette Laws, MD    Nurse/CMA: Charise Carwin MA  Allergies:  Allergies  Allergen Reactions   Flexeril [Cyclobenzaprine] Swelling    Tongue swells   Lisinopril Other (See Comments)    Possible tongue swelling     Other Palpitations    ALL MUSCLE RELAXERS-PER PATIENT   Robaxin [Methocarbamol] Other (See Comments)    Tongue swelling   Contrast Media [Iodinated Contrast Media] Itching   Iodine Itching   Gabapentin Other (See Comments)   Pregabalin Anxiety and Swelling    Anxiety attacks    Consent Signed: {yes VF:643329}  Is patient diabetic? {yes no:314532}  CBG today? ***  Pregnant: {yes no:314532} LMP: Patient's last menstrual period was 08/22/2010. (age 36-55)  Anticoagulants: {Yes/No:19989} Anti-inflammatory: {Yes/No:19989} Antibiotics: {Yes/No:19989}  Procedure: Bilateral L3-4-5 Medial Branch Block   Position: Prone Start Time: ***  End Time: ***  Fluoro Time: ***  RN/CMA Ashaunte Standley MA Klaudia Beirne MA    Time      BP      Pulse      Respirations      O2 Sat      S/S      Pain Level       D/C home with ***, patient A & O X 3, D/C instructions reviewed, and sits independently.

## 2023-05-31 ENCOUNTER — Encounter: Admitting: Physical Medicine & Rehabilitation

## 2023-06-01 ENCOUNTER — Ambulatory Visit (AMBULATORY_SURGERY_CENTER): Admitting: *Deleted

## 2023-06-01 VITALS — Ht 60.0 in | Wt 150.0 lb

## 2023-06-01 DIAGNOSIS — Z800 Family history of malignant neoplasm of digestive organs: Secondary | ICD-10-CM

## 2023-06-01 DIAGNOSIS — Z860100 Personal history of colon polyps, unspecified: Secondary | ICD-10-CM

## 2023-06-01 MED ORDER — SUFLAVE 178.7 G PO SOLR
1.0000 | Freq: Once | ORAL | 0 refills | Status: AC
Start: 2023-06-01 — End: 2023-06-01

## 2023-06-01 NOTE — Progress Notes (Signed)
 Pt's name and DOB verified at the beginning of the pre-visit wit 2 identifiers  Pt denies any difficulty with ambulating,sitting, laying down or rolling side to side  Pt has no issues with ambulation   Pt has no issues moving head neck or swallowing  No egg or soy allergy known to patient   No issues known to pt with past sedation with any surgeries or procedures  Pt denies having issues being intubated  No FH of Malignant Hyperthermia  Pt is not on diet pills or shots  Pt is not on home 02   Pt is not on blood thinners    Pt has frequent issues with constipation RN instructed pt to use Miralax per bottles instructions a week before prep days. Pt states they will  Pt is not on dialysis  Pt denise any abnormal heart rhythms   Pt denies any upcoming cardiac testing  Patient's chart reviewed by Cathlyn Parsons CNRA prior to pre-visit and patient appropriate for the [facility].  Pre-visit completed and red dot placed by patient's name on their procedure day (on provider's schedule).    Chart not reviewed by CRNA prior to [facility]  Visit by phone  Pt states weight is 150 lb  IInstructions reviewed. Pt given [facility], [facility] main # and MD on call # prior to instructions.  Pt states understanding of instructions. Instructed to review again prior to procedure. Pt states they will.   Informed pt that they will receive a text or  call from [facility] regarding there prep med.

## 2023-06-03 ENCOUNTER — Other Ambulatory Visit: Payer: Self-pay | Admitting: Internal Medicine

## 2023-06-04 ENCOUNTER — Other Ambulatory Visit: Payer: Self-pay

## 2023-06-06 ENCOUNTER — Telehealth: Payer: Self-pay

## 2023-06-06 NOTE — Telephone Encounter (Signed)
 Bilateral MBB denied. peer to peer by April 12. case#[id number]

## 2023-06-07 NOTE — Telephone Encounter (Signed)
 I put the paper in cubby for front office to put in your folder

## 2023-06-11 ENCOUNTER — Other Ambulatory Visit: Payer: Self-pay | Admitting: Internal Medicine

## 2023-06-11 DIAGNOSIS — K219 Gastro-esophageal reflux disease without esophagitis: Secondary | ICD-10-CM

## 2023-06-12 ENCOUNTER — Other Ambulatory Visit: Payer: Self-pay

## 2023-06-14 ENCOUNTER — Encounter: Admitting: Physical Medicine & Rehabilitation

## 2023-06-19 ENCOUNTER — Encounter: Payer: Self-pay | Admitting: Gastroenterology

## 2023-06-19 ENCOUNTER — Ambulatory Visit (AMBULATORY_SURGERY_CENTER): Admitting: Gastroenterology

## 2023-06-19 VITALS — BP 130/85 | HR 65 | Temp 97.5°F | Resp 15 | Ht 60.0 in | Wt 150.0 lb

## 2023-06-19 DIAGNOSIS — Z800 Family history of malignant neoplasm of digestive organs: Secondary | ICD-10-CM

## 2023-06-19 DIAGNOSIS — D124 Benign neoplasm of descending colon: Secondary | ICD-10-CM | POA: Diagnosis not present

## 2023-06-19 DIAGNOSIS — K635 Polyp of colon: Secondary | ICD-10-CM

## 2023-06-19 DIAGNOSIS — K5730 Diverticulosis of large intestine without perforation or abscess without bleeding: Secondary | ICD-10-CM | POA: Diagnosis not present

## 2023-06-19 DIAGNOSIS — Z860101 Personal history of adenomatous and serrated colon polyps: Secondary | ICD-10-CM

## 2023-06-19 DIAGNOSIS — K641 Second degree hemorrhoids: Secondary | ICD-10-CM

## 2023-06-19 DIAGNOSIS — D123 Benign neoplasm of transverse colon: Secondary | ICD-10-CM

## 2023-06-19 DIAGNOSIS — Z1211 Encounter for screening for malignant neoplasm of colon: Secondary | ICD-10-CM | POA: Diagnosis not present

## 2023-06-19 DIAGNOSIS — F419 Anxiety disorder, unspecified: Secondary | ICD-10-CM | POA: Diagnosis not present

## 2023-06-19 DIAGNOSIS — F32A Depression, unspecified: Secondary | ICD-10-CM | POA: Diagnosis not present

## 2023-06-19 DIAGNOSIS — D125 Benign neoplasm of sigmoid colon: Secondary | ICD-10-CM

## 2023-06-19 DIAGNOSIS — Z860100 Personal history of colon polyps, unspecified: Secondary | ICD-10-CM

## 2023-06-19 DIAGNOSIS — I10 Essential (primary) hypertension: Secondary | ICD-10-CM | POA: Diagnosis not present

## 2023-06-19 MED ORDER — SODIUM CHLORIDE 0.9 % IV SOLN
500.0000 mL | Freq: Once | INTRAVENOUS | Status: DC
Start: 2023-06-19 — End: 2023-06-19

## 2023-06-19 NOTE — Progress Notes (Signed)
 GASTROENTEROLOGY PROCEDURE H&P NOTE   Primary Care Physician: Roslyn Coombe, MD  HPI: [name] Walker is a 63 y.o. female who presents for Colonoscopy for surveillance prior adenomas.  Past Medical History:  Diagnosis Date   ACHILLES TENDINITIS    ALLERGIC RHINITIS    Allergy    Anginal pain (HCC) 04/04/2016   Pt currently feels like she is having muscle spasms and aching in her chest   Anxiety    Asthma    Chronic kidney disease    Depression    GERD    History of kidney stones    Hyperlipidemia    HYPERTENSION    Kidney stone    LATERAL EPICONDYLITIS, RIGHT    LOW BACK PAIN    Plantar fascial fibromatosis    Sciatica    Stress    Stroke (HCC)    SUBACROMIAL BURSITIS, RIGHT    Thoracic scoliosis 10/01/2015   TIA (transient ischemic attack)    Past Surgical History:  Procedure Laterality Date   ANTERIOR CERVICAL DECOMP/DISCECTOMY FUSION N/A 04/07/2016   Procedure: Cervical two-three Anterior cervical decompression/discectomy/fusion;  Surgeon: Audie Bleacher, MD;  Location: [facility] OR;  Service: Neurosurgery;  Laterality: N/A;   BACK SURGERY  2015   lower back   CYSTOSCOPY/URETEROSCOPY/HOLMIUM LASER/STENT PLACEMENT Right 11/21/2022   Procedure: CYSTOSCOPY RIGHT RETROGRADE PYELOGRAM RIGHT URETEROSCOPY/HOLMIUM LASER/STENT PLACEMENT;  Surgeon: Christina Coyer, MD;  Location: [facility] ORS;  Service: Urology;  Laterality: Right;  60 MINS FOR CASE   EXTRACORPOREAL SHOCK WAVE LITHOTRIPSY Left 09/28/2016   Procedure: LEFT EXTRACORPOREAL SHOCK WAVE LITHOTRIPSY (ESWL);  Surgeon: Christina Coyer, MD;  Location: [facility] OR[facility];  Service: Urology;  Laterality: Left;   EXTRACORPOREAL SHOCK WAVE LITHOTRIPSY Right 04/08/2018   Procedure: EXTRACORPOREAL SHOCK WAVE LITHOTRIPSY (ESWL);  Surgeon: Andrez Banker, MD;  Location: [facility] OR[facility];  Service: Urology;  Laterality: Right;   KIDNEY SURGERY     LITHOTRIPSY  05/2016   TUBAL LIGATION     Current Outpatient Medications  Medication Sig Dispense Refill    amLODipine  (NORVASC ) 5 MG tablet Take 1 tablet (5 mg total) by mouth daily. 90 tablet 2   aspirin  EC 81 MG tablet Take 1 tablet (81 mg total) by mouth daily. Swallow whole. 30 tablet 11   atorvastatin  (LIPITOR) 20 MG tablet Take 1 tablet (20 mg total) by mouth daily. 90 tablet 3   cetirizine  (ZYRTEC ) 10 MG tablet Take 1 tablet (10 mg total) by mouth daily. 90 tablet 1   Cholecalciferol (THERA-D 2000) 50 MCG (2000 UT) TABS 1 tab by mouth once daily (Patient taking differently: Take 1,000 Units by mouth daily.) 30 tablet 99   citalopram  (CELEXA ) 40 MG tablet Take 1 tablet by mouth once daily 90 tablet 0   LORazepam  (ATIVAN ) 1 MG tablet Take 1 tablet (1 mg total) by mouth 2 (two) times daily as needed for anxiety. 60 tablet 2   losartan  (COZAAR ) 50 MG tablet Take 1 tablet by mouth once daily 90 tablet 3   pantoprazole  (PROTONIX ) 40 MG tablet TAKE 1 TABLET BY MOUTH TWICE DAILY BEFORE A MEAL 180 tablet 3   traZODone  (DESYREL ) 100 MG tablet Take 1 tablet (100 mg total) by mouth at bedtime. 90 tablet 1   triamcinolone  cream (KENALOG ) 0.1 % Apply 1 Application topically 2 (two) times daily. 30 g 0   acetaminophen -codeine  (TYLENOL  #4) 300-60 MG tablet Take 1 tablet by mouth every 4 (four) hours as needed for moderate pain (pain score 4-6). 60 tablet 2  albuterol  (VENTOLIN  HFA) 108 (90 Base) MCG/ACT inhaler INHALE 2 PUFFS BY MOUTH EVERY 6 HOURS AS NEEDED FOR WHEEZING FOR SHORTNESS OF BREATH 18 g 0   alendronate  (FOSAMAX ) 70 MG tablet Take 1 tablet (70 mg total) by mouth every 7 (seven) days. Take with a full glass of water on an empty stomach. 12 tablet 3   lidocaine  (LIDODERM ) 5 % Place 1 patch onto the skin daily. Remove & Discard patch within 12 hours or as directed by MD 30 patch 2   Current Facility-Administered Medications  Medication Dose Route Frequency Provider Last Rate Last Admin   0.9 %  sodium chloride  infusion  500 mL Intravenous Once Mansouraty, Davarion Cuffee Jr., MD        Current Outpatient  Medications:    amLODipine  (NORVASC ) 5 MG tablet, Take 1 tablet (5 mg total) by mouth daily., Disp: 90 tablet, Rfl: 2   aspirin  EC 81 MG tablet, Take 1 tablet (81 mg total) by mouth daily. Swallow whole., Disp: 30 tablet, Rfl: 11   atorvastatin  (LIPITOR) 20 MG tablet, Take 1 tablet (20 mg total) by mouth daily., Disp: 90 tablet, Rfl: 3   cetirizine  (ZYRTEC ) 10 MG tablet, Take 1 tablet (10 mg total) by mouth daily., Disp: 90 tablet, Rfl: 1   Cholecalciferol (THERA-D 2000) 50 MCG (2000 UT) TABS, 1 tab by mouth once daily (Patient taking differently: Take 1,000 Units by mouth daily.), Disp: 30 tablet, Rfl: 99   citalopram  (CELEXA ) 40 MG tablet, Take 1 tablet by mouth once daily, Disp: 90 tablet, Rfl: 0   LORazepam  (ATIVAN ) 1 MG tablet, Take 1 tablet (1 mg total) by mouth 2 (two) times daily as needed for anxiety., Disp: 60 tablet, Rfl: 2   losartan  (COZAAR ) 50 MG tablet, Take 1 tablet by mouth once daily, Disp: 90 tablet, Rfl: 3   pantoprazole  (PROTONIX ) 40 MG tablet, TAKE 1 TABLET BY MOUTH TWICE DAILY BEFORE A MEAL, Disp: 180 tablet, Rfl: 3   traZODone  (DESYREL ) 100 MG tablet, Take 1 tablet (100 mg total) by mouth at bedtime., Disp: 90 tablet, Rfl: 1   triamcinolone  cream (KENALOG ) 0.1 %, Apply 1 Application topically 2 (two) times daily., Disp: 30 g, Rfl: 0   acetaminophen -codeine  (TYLENOL  #4) 300-60 MG tablet, Take 1 tablet by mouth every 4 (four) hours as needed for moderate pain (pain score 4-6)., Disp: 60 tablet, Rfl: 2   albuterol  (VENTOLIN  HFA) 108 (90 Base) MCG/ACT inhaler, INHALE 2 PUFFS BY MOUTH EVERY 6 HOURS AS NEEDED FOR WHEEZING FOR SHORTNESS OF BREATH, Disp: 18 g, Rfl: 0   alendronate  (FOSAMAX ) 70 MG tablet, Take 1 tablet (70 mg total) by mouth every 7 (seven) days. Take with a full glass of water on an empty stomach., Disp: 12 tablet, Rfl: 3   lidocaine  (LIDODERM ) 5 %, Place 1 patch onto the skin daily. Remove & Discard patch within 12 hours or as directed by MD, Disp: 30 patch, Rfl:  2  Current Facility-Administered Medications:    0.9 %  sodium chloride  infusion, 500 mL, Intravenous, Once, Mansouraty, Albino Alu., MD Allergies  Allergen Reactions   Flexeril  [Cyclobenzaprine ] Swelling    Tongue swells   Lisinopril  Other (See Comments)    Possible tongue swelling     Other Palpitations    ALL MUSCLE RELAXERS-PER PATIENT   Robaxin  [Methocarbamol ] Other (See Comments)    Tongue swelling   Contrast Media [Iodinated Contrast Media] Itching   Iodine Itching   Gabapentin  Other (See Comments)   Pregabalin Anxiety and  Swelling    Anxiety attacks   Family History  Problem Relation Age of Onset   Diabetes Mother    Dementia Mother    Alzheimer's disease Mother    Colon cancer Father        dx in his 79s   Prostate cancer Father    Colon cancer Sister        dx in her 53s   Breast cancer Maternal Aunt        50's   Esophageal cancer Neg Hx    Stomach cancer Neg Hx    Crohn's disease Neg Hx    Rectal cancer Neg Hx    Social History   Socioeconomic History   Marital status: Legally Separated    Spouse name: Les   Number of children: 3   Years of education: Not on file   Highest education level: Not on file  Occupational History   Occupation: DISABLED/assistant GC  school bus driver  Tobacco Use   Smoking status: Every Day    Current packs/day: 0.50    Average packs/day: 0.5 packs/day for 20.0 years (10.0 ttl pk-yrs)    Types: Cigarettes    Passive exposure: Never   Smokeless tobacco: Never  Vaping Use   Vaping status: Never Used  Substance and Sexual Activity   Alcohol use: No   Drug use: No   Sexual activity: Not Currently    Birth control/protection: Post-menopausal, Surgical    Comment: 1st intercourse 63 yo-Fewer than 5 partners-BTL  Other Topics Concern   Not on file  Social History Narrative   Lives with her sister-2025   Social Drivers of Health   Financial Resource Strain: Low Risk  (03/21/2023)   Overall Financial Resource Strain  (CARDIA)    Difficulty of Paying Living Expenses: Not hard at all  Food Insecurity: No Food Insecurity (03/21/2023)   Hunger Vital Sign    Worried About Running Out of Food in the Last Year: Never true    Ran Out of Food in the Last Year: Never true  Transportation Needs: No Transportation Needs (03/21/2023)   PRAPARE - Administrator, Civil Service (Medical): No    Lack of Transportation (Non-Medical): No  Physical Activity: Inactive (03/21/2023)   Exercise Vital Sign    Days of Exercise per Week: 0 days    Minutes of Exercise per Session: 0 min  Stress: No Stress Concern Present (03/21/2023)   [employer] of Occupational Health - Occupational Stress Questionnaire    Feeling of Stress : Not at all  Social Connections: Moderately Isolated (03/21/2023)   Social Connection and Isolation Panel [NHANES]    Frequency of Communication with Friends and Family: More than three times a week    Frequency of Social Gatherings with Friends and Family: Once a week    Attends Religious Services: More than 4 times per year    Active Member of [employer] or Organizations: No    Attends Banker Meetings: Never    Marital Status: Separated  Intimate Partner Violence: Patient Unable To Answer (03/21/2023)   Humiliation, Afraid, Rape, and Kick questionnaire    Fear of Current or Ex-Partner: Patient unable to answer    Emotionally Abused: Patient unable to answer    Physically Abused: Patient unable to answer    Sexually Abused: Patient unable to answer    Physical Exam: Today's Vitals   06/19/23 1016  BP: (!) 130/93  Pulse: 73  Temp: (!) 97.5 F (36.4 C)  SpO2: 99%  Weight: 150 lb (68 kg)  Height: 5' (1.524 m)   Body mass index is 29.29 kg/m. GEN: NAD EYE: Sclerae anicteric ENT: MMM CV: Non-tachycardic GI: Soft, NT/ND NEURO:  Alert & Oriented x 3  Lab Results: No results for input(s): "WBC", "HGB", "HCT", "PLT" in the last 72 hours. BMET No results for input(s):  "NA", "K", "CL", "CO2", "GLUCOSE", "BUN", "CREATININE", "CALCIUM " in the last 72 hours. LFT No results for input(s): "PROT", "ALBUMIN", "AST", "ALT", "ALKPHOS", "BILITOT", "BILIDIR", "IBILI" in the last 72 hours. PT/INR No results for input(s): "LABPROT", "INR" in the last 72 hours.   Impression / Plan: This is a 63 y.o.female who presents for Colonoscopy for surveillance prior adenomas.  The risks and benefits of endoscopic evaluation/treatment were discussed with the patient and/or family; these include but are not limited to the risk of perforation, infection, bleeding, missed lesions, lack of diagnosis, severe illness requiring hospitalization, as well as anesthesia and sedation related illnesses.  The patient's history has been reviewed, patient examined, no change in status, and deemed stable for procedure.  The patient and/or family is agreeable to proceed.    Yong Henle, MD [facility] Office # [phone]

## 2023-06-19 NOTE — Progress Notes (Signed)
 A/o x 3, VSS, gd SR's, pleased with anesthesia, report to RN

## 2023-06-19 NOTE — Op Note (Signed)
 [facility] Patient Name: [name] Walker Procedure Date: 06/19/2023 10:41 AM MRN: [id number] Endoscopist: Yong Henle , MD, [id number] Age: 63 Referring MD:  Date of Birth: 1960-10-23 Gender: Female Account #: [id number] Procedure:                Colonoscopy Indications:              Screening patient at increased risk: Family history                            of colorectal cancer in multiple 1st-degree                            relatives, Screening in patient at increased risk:                            Colorectal cancer in father 41 or older, Screening                            in patient at increased risk: Colorectal cancer in                            sister before age 29, Surveillance: Personal                            history of adenomatous polyps on last colonoscopy >                            5 years ago Medicines:                Monitored Anesthesia Care Procedure:                Pre-Anesthesia Assessment:                           - Prior to the procedure, a History and Physical                            was performed, and patient medications and                            allergies were reviewed. The patient's tolerance of                            previous anesthesia was also reviewed. The risks                            and benefits of the procedure and the sedation                            options and risks were discussed with the patient.                            All questions were answered, and informed consent  was obtained. Prior Anticoagulants: The patient has                            taken no anticoagulant or antiplatelet agents. ASA                            Grade Assessment: II - A patient with mild systemic                            disease. After reviewing the risks and benefits,                            the patient was deemed in satisfactory condition to                            undergo the  procedure.                           After obtaining informed consent, the colonoscope                            was passed under direct vision. Throughout the                            procedure, the patient's blood pressure, pulse, and                            oxygen saturations were monitored continuously. The                            Olympus Scope SN [id number] was introduced through the                            anus and advanced to the 3 cm into the ileum. The                            colonoscopy was performed without difficulty. The                            patient tolerated the procedure. The quality of the                            bowel preparation was good. The terminal ileum,                            ileocecal valve, appendiceal orifice, and rectum                            were photographed. Scope In: 10:48:13 AM Scope Out: 11:03:29 AM Scope Withdrawal Time: 0 hours 12 minutes 35 seconds  Total Procedure Duration: 0 hours 15 minutes 16 seconds  Findings:                 The digital rectal exam findings include  hemorrhoids. Pertinent negatives include no                            palpable rectal lesions.                           The terminal ileum and ileocecal valve appeared                            normal.                           Four sessile polyps were found in the sigmoid colon                            (2), descending colon (1) and hepatic flexure (1).                            The polyps were 2 to 6 mm in size. These polyps                            were removed with a cold snare. Resection and                            retrieval were complete.                           Many medium-mouthed and small-mouthed diverticula                            were found in the entire colon.                           Normal mucosa was found in the entire colon                            otherwise.                            Non-bleeding non-thrombosed external and internal                            hemorrhoids were found during retroflexion, during                            perianal exam and during digital exam. The                            hemorrhoids were Grade II (internal hemorrhoids                            that prolapse but reduce spontaneously). Complications:            No immediate complications. Estimated Blood Loss:     Estimated blood loss was minimal. Impression:               -  Hemorrhoids found on digital rectal exam.                           - The examined portion of the ileum was normal.                           - Four 2 to 6 mm polyps in the sigmoid colon, in                            the descending colon and at the hepatic flexure,                            removed with a cold snare. Resected and retrieved.                           - Diverticulosis in the entire examined colon.                           - Normal mucosa in the entire examined colon                            otherwise.                           - Non-bleeding non-thrombosed external and internal                            hemorrhoids. Recommendation:           - The patient will be observed post-procedure,                            until all discharge criteria are met.                           - Discharge patient to home.                           - Patient has a contact number available for                            emergencies. The signs and symptoms of potential                            delayed complications were discussed with the                            patient. Return to normal activities tomorrow.                            Written discharge instructions were provided to the                            patient.                           -  High fiber diet.                           - Use FiberCon 1-2 tablets PO daily.                           - Continue present medications.                            - Await pathology results.                           - Repeat colonoscopy in 3 - 5 years for                            surveillance based on pathology results; patient                            should not go more than 5 years between procedures                            no matter pathology as result of her family history                            of colon cancer in father and sister.                           - The findings and recommendations were discussed                            with the patient.                           - The findings and recommendations were discussed                            with the patient's family. Yong Henle, MD 06/19/2023 11:11:35 AM

## 2023-06-19 NOTE — Progress Notes (Signed)
 Pt's states no medical or surgical changes since previsit or office visit.

## 2023-06-19 NOTE — Patient Instructions (Signed)

## 2023-06-19 NOTE — Progress Notes (Signed)
 Called to room to assist during endoscopic procedure.  Patient ID and intended procedure confirmed with present staff. Received instructions for my participation in the procedure from the performing physician.

## 2023-06-20 ENCOUNTER — Telehealth: Payer: Self-pay

## 2023-06-20 NOTE — Telephone Encounter (Signed)
 PT returned call to let us  know that she is feeling great since having procedure on yesterday.

## 2023-06-20 NOTE — Telephone Encounter (Signed)
 Post procedure follow up call, no answer

## 2023-06-22 ENCOUNTER — Encounter: Payer: Self-pay | Admitting: Gastroenterology

## 2023-06-22 LAB — SURGICAL PATHOLOGY

## 2023-08-03 ENCOUNTER — Encounter: Attending: Physical Medicine and Rehabilitation | Admitting: Physical Medicine & Rehabilitation

## 2023-08-03 ENCOUNTER — Encounter: Payer: Self-pay | Admitting: Physical Medicine & Rehabilitation

## 2023-08-03 VITALS — BP 148/93 | HR 72 | Ht 60.0 in | Wt 157.0 lb

## 2023-08-03 DIAGNOSIS — M259 Joint disorder, unspecified: Secondary | ICD-10-CM | POA: Diagnosis not present

## 2023-08-03 DIAGNOSIS — G8929 Other chronic pain: Secondary | ICD-10-CM

## 2023-08-03 DIAGNOSIS — M545 Low back pain, unspecified: Secondary | ICD-10-CM | POA: Insufficient documentation

## 2023-08-03 MED ORDER — KETOROLAC TROMETHAMINE 60 MG/2ML IM SOLN
60.0000 mg | Freq: Once | INTRAMUSCULAR | Status: AC
  Administered 2023-08-03: 60 mg via INTRAMUSCULAR

## 2023-08-03 NOTE — Progress Notes (Signed)
 Subjective:    Patient ID: [name] Walker, female    DOB: 1960/12/10, 63 y.o.   MRN: [id number]  HPI 63 year old female with history of chronic low back pain.  She was last seen 3 months ago in clinic and was doing well at that time.  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  Her current symptoms are mainly in the left lower back and upper buttock area.  She does have some pain that goes into the thigh but no pain below the knee.  No numbness or tingling or weakness. The patient was to get lumbar medial branch blocks past based on last evaluation but had insurance denial. He remains independent with all self-care body  Pain Inventory Average Pain 10 Pain Right Now 10 My pain is constant  In the last 24 hours, has pain interfered with the following? General activity 6 Relation with others 10 Enjoyment of life 1 What TIME of day is your pain at its worst? morning , daytime, evening, and night Sleep (in general) Good  Pain is worse with: walking, bending, sitting, standing, and some activites Pain improves with: rest, medication, and heat Relief from Meds: 5  Family History  Problem Relation Age of Onset   Diabetes Mother    Dementia Mother    Alzheimer's disease Mother    Colon cancer Father        dx in his 4s   Prostate cancer Father    Colon cancer Sister        dx in her 31s   Breast cancer Maternal Aunt        50's   Esophageal cancer Neg Hx    Stomach cancer Neg Hx    Crohn's disease Neg Hx    Rectal cancer Neg Hx    Social History   Socioeconomic History   Marital status: Legally Separated    Spouse name: Les   Number of children: 3   Years of education: Not on file   Highest education level: Not on file  Occupational History   Occupation: DISABLED/assistant GC  school bus driver  Tobacco Use   Smoking status: Every Day    Current packs/day: 0.50    Average packs/day: 0.5 packs/day for  20.0 years (10.0 ttl pk-yrs)    Types: Cigarettes    Passive exposure: Never   Smokeless tobacco: Never  Vaping Use   Vaping status: Never Used  Substance and Sexual Activity   Alcohol use: No   Drug use: No   Sexual activity: Not Currently    Birth control/protection: Post-menopausal, Surgical    Comment: 1st intercourse 63 yo-Fewer than 5 partners-BTL  Other Topics Concern   Not on file  Social History Narrative   Lives with her sister-2025   Social Drivers of Health   Financial Resource Strain: Low Risk  (03/21/2023)   Overall Financial Resource Strain (CARDIA)    Difficulty of Paying Living Expenses: Not hard at all  Food Insecurity: No Food Insecurity (03/21/2023)   Hunger Vital Sign    Worried About Running Out of Food in the Last Year: Never true    Ran Out of Food in the Last Year: Never true  Transportation Needs: No Transportation Needs (03/21/2023)   PRAPARE - Administrator, Civil Service (Medical): No    Lack of Transportation (Non-Medical): No  Physical Activity: Inactive (03/21/2023)   Exercise Vital Sign    Days of  Exercise per Week: 0 days    Minutes of Exercise per Session: 0 min  Stress: No Stress Concern Present (03/21/2023)   [employer] of Occupational Health - Occupational Stress Questionnaire    Feeling of Stress : Not at all  Social Connections: Moderately Isolated (03/21/2023)   Social Connection and Isolation Panel [NHANES]    Frequency of Communication with Friends and Family: More than three times a week    Frequency of Social Gatherings with Friends and Family: Once a week    Attends Religious Services: More than 4 times per year    Active Member of [employer] or Organizations: No    Attends Banker Meetings: Never    Marital Status: Separated   Past Surgical History:  Procedure Laterality Date   ANTERIOR CERVICAL DECOMP/DISCECTOMY FUSION N/A 04/07/2016   Procedure: Cervical two-three Anterior cervical  decompression/discectomy/fusion;  Surgeon: Audie Bleacher, MD;  Location: [facility] OR;  Service: Neurosurgery;  Laterality: N/A;   BACK SURGERY  2015   lower back   CYSTOSCOPY/URETEROSCOPY/HOLMIUM LASER/STENT PLACEMENT Right 11/21/2022   Procedure: CYSTOSCOPY RIGHT RETROGRADE PYELOGRAM RIGHT URETEROSCOPY/HOLMIUM LASER/STENT PLACEMENT;  Surgeon: Christina Coyer, MD;  Location: [facility];  Service: Urology;  Laterality: Right;  60 MINS FOR CASE   EXTRACORPOREAL SHOCK WAVE LITHOTRIPSY Left 09/28/2016   Procedure: LEFT EXTRACORPOREAL SHOCK WAVE LITHOTRIPSY (ESWL);  Surgeon: Christina Coyer, MD;  Location: [facility];  Service: Urology;  Laterality: Left;   EXTRACORPOREAL SHOCK WAVE LITHOTRIPSY Right 04/08/2018   Procedure: EXTRACORPOREAL SHOCK WAVE LITHOTRIPSY (ESWL);  Surgeon: Andrez Banker, MD;  Location: [facility];  Service: Urology;  Laterality: Right;   KIDNEY SURGERY     LITHOTRIPSY  05/2016   TUBAL LIGATION     Past Surgical History:  Procedure Laterality Date   ANTERIOR CERVICAL DECOMP/DISCECTOMY FUSION N/A 04/07/2016   Procedure: Cervical two-three Anterior cervical decompression/discectomy/fusion;  Surgeon: Audie Bleacher, MD;  [facility];  Service: Neurosurgery;  Laterality: N/A;   BACK SURGERY  2015   lower back   CYSTOSCOPY/URETEROSCOPY/HOLMIUM LASER/STENT PLACEMENT Right 11/21/2022   Procedure: CYSTOSCOPY RIGHT RETROGRADE PYELOGRAM RIGHT URETEROSCOPY/HOLMIUM LASER/STENT PLACEMENT;  Surgeon: Christina Coyer, MD;  Location: [facility];  Service: Urology;  Laterality: Right;  60 MINS FOR CASE   EXTRACORPOREAL SHOCK WAVE LITHOTRIPSY Left 09/28/2016   Procedure: LEFT EXTRACORPOREAL SHOCK WAVE LITHOTRIPSY (ESWL);  Surgeon: Christina Coyer, MD;  Location: [facility];  Service: Urology;  Laterality: Left;   EXTRACORPOREAL SHOCK WAVE LITHOTRIPSY Right 04/08/2018   Procedure: EXTRACORPOREAL SHOCK WAVE LITHOTRIPSY (ESWL);  Surgeon: Andrez Banker, MD;  Location: [facility];  Service: Urology;  Laterality:  Right;   KIDNEY SURGERY     LITHOTRIPSY  05/2016   TUBAL LIGATION     Past Medical History:  Diagnosis Date   ACHILLES TENDINITIS    ALLERGIC RHINITIS    Allergy    Anginal pain (HCC) 04/04/2016   Pt currently feels like she is having muscle spasms and aching in her chest   Anxiety    Asthma    Chronic kidney disease    Depression    GERD    History of kidney stones    Hyperlipidemia    HYPERTENSION    Kidney stone    LATERAL EPICONDYLITIS, RIGHT    LOW BACK PAIN    Plantar fascial fibromatosis    Sciatica    Stress    Stroke (HCC)    SUBACROMIAL BURSITIS, RIGHT    Thoracic scoliosis 10/01/2015   TIA (transient ischemic attack)  LMP 08/22/2010   Opioid Risk Score:   Fall Risk Score:  `1  Depression screen [facility] 2/9     05/04/2023    9:51 AM 03/21/2023   11:47 AM 03/06/2023   10:56 AM 01/05/2023   10:13 AM 12/01/2022   11:02 AM 10/20/2022   10:55 AM 08/04/2022   10:59 AM  Depression screen PHQ 2/9  Decreased Interest 0 0 0 0 0 0 0  Down, Depressed, Hopeless 0 0 0 0 0 0 0  PHQ - 2 Score 0 0 0 0 0 0 0  Altered sleeping  1       Tired, decreased energy  2       Change in appetite  0       Feeling bad or failure about yourself   0       Trouble concentrating  0       Moving slowly or fidgety/restless  0       Suicidal thoughts  0       PHQ-9 Score  3       Difficult doing work/chores  Not difficult at all         Review of Systems  Musculoskeletal:  Positive for back pain.       Left upper leg and hip  All other systems reviewed and are negative.      Objective:   Physical Exam General No acute distress Mood and affect appropriate Extremities without edema   Sacral thrust (prone) : Positive left FABER's: Positive left Distraction (supine): Positive left Thigh thrust test: Positive left  Negative straight leg raise Strength 5/5 flexion extension ankle dorsiflexion  Lumbar spine tenderness in the lower L5 region and into the upper sacral  region Ambulates without assistive device no evidence of toe drag    Assessment & Plan:   1.  Acute exacerbation of chronic low back pain.  He has mainly left lower lumbar and sacral pain.  Her exam is most consistent with recurrent sacroiliac disorder Will schedule for sacroiliac injection.  Meantime we will give Toradol  injection today 60 mg IM

## 2023-08-03 NOTE — Patient Instructions (Signed)
 Sacroiliac Joint Injection: What to Expect The SI joint is where two bones in your lower back meet. These bones are at the bottom of your spine (sacrum) and upper part of the hip bone (ilium). During a sacroiliac (SI) joint injection, a health care provider injects medicine into the SI joint to help with pain. You might need this injection if your SI joint is swollen or hurt due to problems like rheumatoid arthritis, gout, infection, or injury. This pain can affect your lower back, butt, or leg. Your provider may use a numbing medicine (anesthetic block) or medicine to reduce swelling (steroid medicine). SI joint injection may be done to: Check if the numbing medicine helps with the pain. This can show if the SI joint is the cause of the pain. Treat the painful SI joint with steroid medicine, numbing medicine, or both. Tell a health care provider about: Any allergies you have. All medicines you take. These include vitamins, herbs, eye drops, and creams. Any problems you or family members have had with anesthesia. Any bleeding problems you have. Any surgeries you've had. Any medical problems you have. Whether you're pregnant or may be pregnant. What are the risks? Your provider will talk with you about risks. These may include: Infection. Bleeding. Allergies to medicines. Nerve injury. Failure to relieve pain. What happens before? Medicines Ask about changing or stopping: Any medicines you take. Any vitamins, herbs, or supplements you take. Do not take aspirin  or ibuprofen  unless you're told to. Tests You may have tests, such as: A physical exam. Blood tests. Imaging tests, such as an X-ray, CT scan, or MRI. General instructions Eat and drink only as you've been told. Do not smoke, vape, or use nicotine  or tobacco for at least 4 weeks before the procedure. Plan to have a responsible adult take you home from the hospital or clinic. If you are getting a steroid medicine injection  and you have diabetes or pre-diabetes: Talk with your provider about how to manage your blood sugar, also called blood glucose. Steroids can make your blood sugar go up and stay high for a few days. Your provider can make a plan to make sure your blood sugar level stays under control. What happens during a sacroiliac joint injection?  You will be awake during the procedure and may be given: A sedative to help you relax. Local anesthesia to keep you from feeling pain. Your provider will inject it into the skin above your SI joint. Your provider will position you for the injection and locate the injection site over your joint. The skin over the joint will be cleaned with a soap that kills germs. An X-ray machine that produces moving X-ray images (fluoroscopy) will be placed above the procedure table. A long, thin needle will be placed through your skin into your SI joint. Your provider will use fluoroscopy to guide the needle to the right spot. An X-ray dye will be injected to make sure the needle enters the joint space. You may be asked if you feel any pain. Numbing medicine will be injected. Steroids may also be used. The needle will be removed. A small bandage will be placed over the injection site. These steps may vary. Ask what you can expect. What happens after? You'll be watched closely until you leave. This includes checking your pain level, blood pressure, heart rate, and breathing rate. You will be told to drink plenty of water to wash (flush) the dye out of your body. If you were given  a sedative, do not drive or use machines until you're told it's safe. A sedative can make you sleepy. This information is not intended to replace advice given to you by your health care provider. Make sure you discuss any questions you have with your health care provider. Document Revised: 02/14/2023 Document Reviewed: 02/14/2023 Elsevier Patient Education  2025 [employer].

## 2023-08-05 ENCOUNTER — Other Ambulatory Visit: Payer: Self-pay | Admitting: Family Medicine

## 2023-08-05 ENCOUNTER — Other Ambulatory Visit: Payer: Self-pay | Admitting: Internal Medicine

## 2023-08-05 DIAGNOSIS — J209 Acute bronchitis, unspecified: Secondary | ICD-10-CM

## 2023-08-05 DIAGNOSIS — R062 Wheezing: Secondary | ICD-10-CM

## 2023-08-05 DIAGNOSIS — R051 Acute cough: Secondary | ICD-10-CM

## 2023-08-06 ENCOUNTER — Other Ambulatory Visit: Payer: Self-pay

## 2023-08-21 ENCOUNTER — Other Ambulatory Visit: Payer: Self-pay | Admitting: Internal Medicine

## 2023-08-22 ENCOUNTER — Other Ambulatory Visit: Payer: Self-pay

## 2023-08-29 ENCOUNTER — Other Ambulatory Visit: Payer: Self-pay | Admitting: Internal Medicine

## 2023-08-29 MED ORDER — LORAZEPAM 1 MG PO TABS: 1.0000 mg | ORAL_TABLET | Freq: Two times a day (BID) | ORAL | 2 refills | Status: DC | PRN

## 2023-08-29 NOTE — Telephone Encounter (Signed)
 Copied from CRM [id number]. Topic: Clinical - Medication Refill >> Aug 29, 2023 11:51 AM [place] M wrote: Medication: LORazepam  (ATIVAN ) 1 MG tablet  Has the patient contacted their pharmacy? Yes (Agent: If no, request that the patient contact the pharmacy for the refill. If patient does not wish to contact the pharmacy document the reason why and proceed with request.) (Agent: If yes, when and what did the pharmacy advise?)  This is the patient's preferred pharmacy:  [facility] [address], [place] - [address] [place] [address] Phone: [phone] Fax: [phone]   Is this the correct pharmacy for this prescription? Yes If no, delete pharmacy and type the correct one.   Has the prescription been filled recently? Yes  Is the patient out of the medication? Yes  Has the patient been seen for an appointment in the last year OR does the patient have an upcoming appointment? Yes  Can we respond through MyChart? Yes  Agent: Please be advised that Rx refills may take up to 3 business days. We ask that you follow-up with your pharmacy.

## 2023-09-04 NOTE — Progress Notes (Unsigned)
  PROCEDURE RECORD [facility]   Name: [name] Walker DOB:[birth year] MRN: [id number]  Date:09/04/2023  Physician: Prentice Compton MD    Nurse/CMA: Tye Kitty MA  Allergies:  Allergies  Allergen Reactions   Flexeril  [Cyclobenzaprine ] Swelling    Tongue swells   Lisinopril  Other (See Comments)    Possible tongue swelling     Other Palpitations    ALL MUSCLE RELAXERS-PER PATIENT   Robaxin  [Methocarbamol ] Other (See Comments)    Tongue swelling   Contrast Media [Iodinated Contrast Media] Itching   Iodine Itching   Gabapentin  Other (See Comments)   Pregabalin Anxiety and Swelling    Anxiety attacks    Consent Signed: {yes [id number]}  Is patient diabetic? {yes no:314532}  CBG today? ***  Pregnant: {yes no:314532} LMP: Patient's last menstrual period was 08/22/2010. (age 31-55)  Anticoagulants: {Yes/No:19989} Anti-inflammatory: {Yes/No:19989} Antibiotics: {Yes/No:19989}  Procedure: ***  Position: Prone Start Time: ***  End Time: ***  Fluoro Time: ***  RN/CMA      Time      BP      Pulse      Respirations      O2 Sat      S/S      Pain Level       D/C home with ***, patient A & O X 3, D/C instructions reviewed, and sits independently.

## 2023-09-06 ENCOUNTER — Encounter: Payer: Self-pay | Admitting: Physical Medicine & Rehabilitation

## 2023-09-06 ENCOUNTER — Encounter: Attending: Physical Medicine and Rehabilitation | Admitting: Physical Medicine & Rehabilitation

## 2023-09-06 VITALS — BP 135/71 | HR 68 | Temp 97.9°F | Ht 60.0 in | Wt 157.0 lb

## 2023-09-06 DIAGNOSIS — M533 Sacrococcygeal disorders, not elsewhere classified: Secondary | ICD-10-CM | POA: Diagnosis not present

## 2023-09-06 MED ORDER — ACETAMINOPHEN-CODEINE 300-60 MG PO TABS: 1.0000 | ORAL_TABLET | ORAL | 2 refills | Status: DC | PRN

## 2023-09-06 MED ORDER — BETAMETHASONE SOD PHOS & ACET 6 (3-3) MG/ML IJ SUSP
3.0000 mg | Freq: Once | INTRAMUSCULAR | Status: AC
  Administered 2023-09-06: 3 mg via INTRAMUSCULAR

## 2023-09-06 MED ORDER — LIDOCAINE HCL (PF) 2 % IJ SOLN
1.0000 mL | Freq: Once | INTRAMUSCULAR | Status: AC
  Administered 2023-09-06: 1 mL

## 2023-09-06 MED ORDER — IOHEXOL 180 MG/ML  SOLN
1.0000 mL | Freq: Once | INTRAMUSCULAR | Status: AC
  Administered 2023-09-06: 1 mL

## 2023-09-06 MED ORDER — LIDOCAINE HCL 1 % IJ SOLN
5.0000 mL | Freq: Once | INTRAMUSCULAR | Status: AC
  Administered 2023-09-06: 5 mL

## 2023-09-06 NOTE — Progress Notes (Signed)
 Right sacroiliac injection under fluoroscopic guidance  Indication: Right Low back and buttocks pain not relieved by medication management and other conservative care.hx itching with omnipaque  for CT scan but has tolerated low volume omnipaque , 1 ml without itching   Informed consent was obtained after describing risks and benefits of the procedure with the patient, this includes bleeding, bruising, infection, paralysis and medication side effects. The patient wishes to proceed and has given written consent. The patient was placed in a prone position. The lumbar and sacral area was marked and prepped with Betadine. A 25-gauge 1-1/2 inch needle was inserted into the skin and subcutaneous tissue and 1 mL of 1% lidocaine  was injected. Then a 25-gauge 3 inch spinal needle was inserted under fluoroscopic guidance into the Right sacroiliac joint. AP and lateral images were utilized. Omnipaque  180x0.5 mL under live fluoroscopy demonstrated no intravascular uptake. Then a solution containing one ML of 6 mg per ml betamethasone  and 2 ML of 1% lidocaine  MPF was injected x1.5 mL. Patient tolerated the procedure well. Post procedure instructions were given. Please see post procedure form.

## 2023-09-07 ENCOUNTER — Ambulatory Visit: Admitting: Allergy

## 2023-09-07 NOTE — Progress Notes (Deleted)
 New Patient Note  RE: [name] Walker MRN: [id number] DOB: 04/06/1960 Date of Office Visit: 09/07/2023  Consult requested by: [name] Walker ORN, MD Primary care provider: Norleen Walker ORN, MD  Chief Complaint: No chief complaint on file.  History of Present Illness: I had the pleasure of seeing [name] Walker for initial evaluation at the [facility] on 09/07/2023. She is a 63 y.o. female, who is referred here by [name] Walker ORN, MD for the evaluation of ***.  Discussed the use of AI scribe software for clinical note transcription with the patient, who gave verbal consent to proceed.  History of Present Illness             ***  Assessment and Plan: [name] is a 63 y.o. female with: ***  Assessment and Plan               No follow-ups on file.  No orders of the defined types were placed in this encounter.  Lab Orders  No laboratory test(s) ordered today    Other allergy screening: Asthma: {Blank single:19197::yes,no} Rhino conjunctivitis: {Blank single:19197::yes,no} Food allergy: {Blank single:19197::yes,no} Medication allergy: {Blank single:19197::yes,no} Hymenoptera allergy: {Blank single:19197::yes,no} Urticaria: {Blank single:19197::yes,no} Eczema:{Blank single:19197::yes,no} History of recurrent infections suggestive of immunodeficency: {Blank single:19197::yes,no}  Diagnostics: Spirometry:  Tracings reviewed. Her effort: {Blank single:19197::Good reproducible efforts.,It was hard to get consistent efforts and there is a question as to whether this reflects a maximal maneuver.,Poor effort, data can not be interpreted.} FVC: ***L FEV1: ***L, ***% predicted FEV1/FVC ratio: ***% Interpretation: {Blank single:19197::Spirometry consistent with mild obstructive disease,Spirometry consistent with moderate obstructive disease,Spirometry consistent with severe obstructive disease,Spirometry consistent  with possible restrictive disease,Spirometry consistent with mixed obstructive and restrictive disease,Spirometry uninterpretable due to technique,Spirometry consistent with normal pattern,No overt abnormalities noted given today's efforts}.  Please see scanned spirometry results for details.  Skin Testing: {Blank single:19197::Select foods,Environmental allergy panel,Environmental allergy panel and select foods,Food allergy panel,None,Deferred due to recent antihistamines use}. *** Results discussed with patient/family.   Past Medical History: Patient Active Problem List   Diagnosis Date Noted  . Insect bite 07/29/2022  . Non-recurrent acute serous otitis media of left ear 12/05/2021  . Left otitis media 10/26/2021  . Bacterial ear infection, bilateral 10/17/2021  . Pain of right sacroiliac joint 10/07/2021  . Chronic low back pain with left-sided sciatica 03/31/2020  . Lumbar disc disease 03/02/2020  . Left sided sciatica 01/31/2020  . Depression 01/29/2020  . Hematuria 01/29/2020  . Left-sided weakness 10/31/2019  . Balance disorder 10/31/2019  . Difficulty with speech 10/02/2019  . Insomnia 10/02/2019  . HLD (hyperlipidemia) 04/14/2019  . Acute otalgia, bilateral 04/14/2019  . Vitamin D  deficiency 04/14/2019  . Anxiety with depression 10/06/2018  . Neck pain 09/10/2018  . Hand swelling 09/10/2018  . Bilateral radiating leg pain 04/23/2018  . Myalgia 04/23/2018  . Right renal stone 04/03/2018  . Smoker 04/29/2016  . Bilateral hand pain 04/29/2016  . HNP (herniated nucleus pulposus), cervical 04/07/2016  . Cervical radiculopathy, acute 01/29/2016  . Acute upper respiratory infection 01/29/2016  . Degenerative disc disease, cervical 01/11/2016  . Hyperglycemia 11/17/2015  . Angioedema 10/12/2015  . Bilateral lower extremity edema 10/12/2015  . Mouth sores 10/12/2015  . Hypokalemia 10/12/2015  . Thoracic scoliosis 10/01/2015  . Peripheral edema  10/01/2015  . Eczema 10/01/2015  . Asthma 10/01/2015  . Breast lump on right side at 4 o'clock position 01/21/2014  . Hot flashes 10/01/2013  . Lumbar radiculopathy 07/17/2012  .  Grief reaction 07/17/2012  . Family history of colon cancer 07/02/2012  . OAB (overactive bladder) 03/01/2012  . Encounter for well adult exam with abnormal findings 08/23/2010  . Plantar fasciitis 08/23/2010  . Dysuria 01/05/2010  . UTI (urinary tract infection) 10/19/2008  . ANKLE PAIN, BILATERAL 10/19/2008  . VAGINITIS 09/10/2008  . JOINT EFFUSION, ANKLE 09/10/2008  . SUBACROMIAL BURSITIS, RIGHT 09/01/2008  . ACHILLES TENDINITIS 09/01/2008  . LATERAL EPICONDYLITIS, RIGHT 08/18/2008  . Essential hypertension 07/12/2007  . Allergic rhinitis 07/12/2007  . GERD 07/12/2007  . Chronic low back pain 07/12/2007  . NEPHROLITHIASIS, HX OF 07/12/2007   Past Medical History:  Diagnosis Date  . ACHILLES TENDINITIS   . ALLERGIC RHINITIS   . Allergy   . Anginal pain (HCC) 04/04/2016   Pt currently feels like she is having muscle spasms and aching in her chest  . Anxiety   . Asthma   . Chronic kidney disease   . Depression   . GERD   . History of kidney stones   . Hyperlipidemia   . HYPERTENSION   . Kidney stone   . LATERAL EPICONDYLITIS, RIGHT   . LOW BACK PAIN   . Plantar fascial fibromatosis   . Sciatica   . Stress   . Stroke (HCC)   . SUBACROMIAL BURSITIS, RIGHT   . Thoracic scoliosis 10/01/2015  . TIA (transient ischemic attack)    Past Surgical History: Past Surgical History:  Procedure Laterality Date  . ANTERIOR CERVICAL DECOMP/DISCECTOMY FUSION N/A 04/07/2016   Procedure: Cervical two-three Anterior cervical decompression/discectomy/fusion;  Surgeon: Rockey Peru, MD;  Location: [facility] OR;  Service: Neurosurgery;  Laterality: N/A;  . BACK SURGERY  2015   lower back  . CYSTOSCOPY/URETEROSCOPY/HOLMIUM LASER/STENT PLACEMENT Right 11/21/2022   Procedure: CYSTOSCOPY RIGHT RETROGRADE PYELOGRAM RIGHT  URETEROSCOPY/HOLMIUM LASER/STENT PLACEMENT;  Surgeon: Nieves Cough, MD;  Location: [facility] OR[facility];  Service: Urology;  Laterality: Right;  60 MINS FOR CASE  . EXTRACORPOREAL SHOCK WAVE LITHOTRIPSY Left 09/28/2016   Procedure: LEFT EXTRACORPOREAL SHOCK WAVE LITHOTRIPSY (ESWL);  Surgeon: Nieves Cough, MD;  Location: [facility];  Service: Urology;  Laterality: Left;  . EXTRACORPOREAL SHOCK WAVE LITHOTRIPSY Right 04/08/2018   Procedure: EXTRACORPOREAL SHOCK WAVE LITHOTRIPSY (ESWL);  Surgeon: Cam Morene ORN, MD;  Location: [facility];  Service: Urology;  Laterality: Right;  . KIDNEY SURGERY    . LITHOTRIPSY  05/2016  . TUBAL LIGATION     Medication List:  Current Outpatient Medications  Medication Sig Dispense Refill  . acetaminophen -codeine  (TYLENOL  #4) 300-60 MG tablet Take 1 tablet by mouth every 4 (four) hours as needed for moderate pain (pain score 4-6). 60 tablet 2  . albuterol  (VENTOLIN  HFA) 108 (90 Base) MCG/ACT inhaler INHALE 2 PUFFS BY MOUTH EVERY 6 HOURS AS NEEDED FOR WHEEZING FOR SHORTNESS OF BREATH 18 g 0  . alendronate  (FOSAMAX ) 70 MG tablet Take 1 tablet (70 mg total) by mouth every 7 (seven) days. Take with a full glass of water on an empty stomach. 12 tablet 3  . amLODipine  (NORVASC ) 5 MG tablet Take 1 tablet by mouth once daily 90 tablet 0  . aspirin  EC 81 MG tablet Take 1 tablet (81 mg total) by mouth daily. Swallow whole. 30 tablet 11  . atorvastatin  (LIPITOR) 20 MG tablet Take 1 tablet (20 mg total) by mouth daily. 90 tablet 3  . cetirizine  (ZYRTEC ) 10 MG tablet Take 1 tablet (10 mg total) by mouth daily. 90 tablet 1  . Cholecalciferol (THERA-D 2000) 50 MCG (2000 UT)  TABS 1 tab by mouth once daily (Patient taking differently: Take 1,000 Units by mouth daily.) 30 tablet 99  . citalopram  (CELEXA ) 40 MG tablet Take 1 tablet by mouth once daily 90 tablet 0  . lidocaine  (LIDODERM ) 5 % Place 1 patch onto the skin daily. Remove & Discard patch within 12 hours or as directed by MD 30 patch 2   . LORazepam  (ATIVAN ) 1 MG tablet Take 1 tablet (1 mg total) by mouth 2 (two) times daily as needed for anxiety. 60 tablet 2  . losartan  (COZAAR ) 50 MG tablet Take 1 tablet by mouth once daily 90 tablet 3  . pantoprazole  (PROTONIX ) 40 MG tablet TAKE 1 TABLET BY MOUTH TWICE DAILY BEFORE A MEAL 180 tablet 3  . traZODone  (DESYREL ) 100 MG tablet Take 1 tablet (100 mg total) by mouth at bedtime. 90 tablet 1   No current facility-administered medications for this visit.   Allergies: Allergies  Allergen Reactions  . Flexeril  [Cyclobenzaprine ] Swelling    Tongue swells  . Lisinopril  Other (See Comments)    Possible tongue swelling    . Other Palpitations    ALL MUSCLE RELAXERS-PER PATIENT  . Robaxin  [Methocarbamol ] Other (See Comments)    Tongue swelling  . Contrast Media [Iodinated Contrast Media] Itching  . Iodine Itching  . Gabapentin  Other (See Comments)  . Pregabalin Anxiety and Swelling    Anxiety attacks   Social History: Social History   Socioeconomic History  . Marital status: Legally Separated    Spouse name: Les  . Number of children: 3  . Years of education: Not on file  . Highest education level: Not on file  Occupational History  . Occupation: DISABLED/assistant GC  school bus driver  Tobacco Use  . Smoking status: Every Day    Current packs/day: 0.50    Average packs/day: 0.5 packs/day for 20.0 years (10.0 ttl pk-yrs)    Types: Cigarettes    Passive exposure: Never  . Smokeless tobacco: Never  Vaping Use  . Vaping status: Never Used  Substance and Sexual Activity  . Alcohol use: No  . Drug use: No  . Sexual activity: Not Currently    Birth control/protection: Post-menopausal, Surgical    Comment: 1st intercourse 63 yo-Fewer than 5 partners-BTL  Other Topics Concern  . Not on file  Social History Narrative   Lives with her sister-2025   Social Drivers of Health   Financial Resource Strain: Low Risk  (03/21/2023)   Overall Financial Resource Strain  (CARDIA)   . Difficulty of Paying Living Expenses: Not hard at all  Food Insecurity: No Food Insecurity (03/21/2023)   Hunger Vital Sign   . Worried About Programme researcher, broadcasting/film/video in the Last Year: Never true   . Ran Out of Food in the Last Year: Never true  Transportation Needs: No Transportation Needs (03/21/2023)   PRAPARE - Transportation   . Lack of Transportation (Medical): No   . Lack of Transportation (Non-Medical): No  Physical Activity: Inactive (03/21/2023)   Exercise Vital Sign   . Days of Exercise per Week: 0 days   . Minutes of Exercise per Session: 0 min  Stress: No Stress Concern Present (03/21/2023)   [employer] of Occupational Health - Occupational Stress Questionnaire   . Feeling of Stress : Not at all  Social Connections: Moderately Isolated (03/21/2023)   Social Connection and Isolation Panel   . Frequency of Communication with Friends and Family: More than three times a week   .  Frequency of Social Gatherings with Friends and Family: Once a week   . Attends Religious Services: More than 4 times per year   . Active Member of Clubs or Organizations: No   . Attends Banker Meetings: Never   . Marital Status: Separated   Lives in a ***. Smoking: *** Occupation: ***  Environmental HistorySurveyor, minerals in the house: Network engineer in the family room: {Blank single:19197::yes,no} Carpet in the bedroom: {Blank single:19197::yes,no} Heating: {Blank single:19197::electric,gas,heat pump} Cooling: {Blank single:19197::central,window,heat pump} Pet: {Blank single:19197::yes ***,no}  Family History: Family History  Problem Relation Age of Onset  . Diabetes Mother   . Dementia Mother   . Alzheimer's disease Mother   . Colon cancer Father        dx in his 81s  . Prostate cancer Father   . Colon cancer Sister        dx in her 79s  . Breast cancer Maternal Aunt        50's  . Esophageal cancer  Neg Hx   . Stomach cancer Neg Hx   . Crohn's disease Neg Hx   . Rectal cancer Neg Hx    Problem                               Relation Asthma                                   *** Eczema                                *** Food allergy                          *** Allergic rhino conjunctivitis     ***  Review of Systems  Constitutional:  Negative for appetite change, chills, fever and unexpected weight change.  HENT:  Negative for congestion and rhinorrhea.   Eyes:  Negative for itching.  Respiratory:  Negative for cough, chest tightness, shortness of breath and wheezing.   Cardiovascular:  Negative for chest pain.  Gastrointestinal:  Negative for abdominal pain.  Genitourinary:  Negative for difficulty urinating.  Skin:  Negative for rash.  Neurological:  Negative for headaches.    Objective: LMP 08/22/2010  There is no height or weight on file to calculate BMI. Physical Exam Vitals and nursing note reviewed.  Constitutional:      Appearance: Normal appearance. She is well-developed.  HENT:     Head: Normocephalic and atraumatic.     Right Ear: Tympanic membrane and external ear normal.     Left Ear: Tympanic membrane and external ear normal.     Nose: Nose normal.     Mouth/Throat:     Mouth: Mucous membranes are moist.     Pharynx: Oropharynx is clear.  Eyes:     Conjunctiva/sclera: Conjunctivae normal.  Cardiovascular:     Rate and Rhythm: Normal rate and regular rhythm.     Heart sounds: Normal heart sounds. No murmur heard.    No friction rub. No gallop.  Pulmonary:     Effort: Pulmonary effort is normal.     Breath sounds: Normal breath sounds. No wheezing, rhonchi or rales.  Musculoskeletal:     Cervical back: Neck supple.  Skin:  General: Skin is warm.     Findings: No rash.  Neurological:     Mental Status: She is alert and oriented to person, place, and time.  Psychiatric:        Behavior: Behavior normal.   The plan was reviewed with the  patient/family, and all questions/concerned were addressed.  It was my pleasure to see Tamyia today and participate in her care. Please feel free to contact me with any questions or concerns.  Sincerely,  Orlan Cramp, DO Allergy & Immunology  [facility]  [facility] office: [phone] [facility] office: [phone]

## 2023-10-02 ENCOUNTER — Other Ambulatory Visit: Payer: Self-pay

## 2023-10-02 ENCOUNTER — Encounter: Payer: Self-pay | Admitting: Allergy & Immunology

## 2023-10-02 ENCOUNTER — Ambulatory Visit (INDEPENDENT_AMBULATORY_CARE_PROVIDER_SITE_OTHER): Admitting: Allergy & Immunology

## 2023-10-02 VITALS — BP 130/82 | HR 72 | Temp 97.6°F | Resp 20 | Ht 60.0 in | Wt 154.5 lb

## 2023-10-02 DIAGNOSIS — K219 Gastro-esophageal reflux disease without esophagitis: Secondary | ICD-10-CM

## 2023-10-02 DIAGNOSIS — T7800XA Anaphylactic reaction due to unspecified food, initial encounter: Secondary | ICD-10-CM

## 2023-10-02 DIAGNOSIS — J310 Chronic rhinitis: Secondary | ICD-10-CM

## 2023-10-02 DIAGNOSIS — T7800XD Anaphylactic reaction due to unspecified food, subsequent encounter: Secondary | ICD-10-CM

## 2023-10-02 DIAGNOSIS — J4520 Mild intermittent asthma, uncomplicated: Secondary | ICD-10-CM | POA: Diagnosis not present

## 2023-10-02 MED ORDER — LEVOCETIRIZINE DIHYDROCHLORIDE 5 MG PO TABS
5.0000 mg | ORAL_TABLET | Freq: Every evening | ORAL | 1 refills | Status: AC
Start: 2023-10-02 — End: ?

## 2023-10-02 MED ORDER — FLUTICASONE-SALMETEROL 250-50 MCG/ACT IN AEPB: 1.0000 | INHALATION_SPRAY | Freq: Every day | RESPIRATORY_TRACT | 5 refills | Status: AC

## 2023-10-02 MED ORDER — FLUTICASONE PROPIONATE 50 MCG/ACT NA SUSP
1.0000 | Freq: Two times a day (BID) | NASAL | 5 refills | Status: DC
Start: 2023-10-02 — End: 2023-12-17

## 2023-10-02 NOTE — Progress Notes (Unsigned)
 NEW PATIENT  Date of Service/Encounter:  10/02/23  Consult requested by: Norleen Lynwood ORN, MD   Assessment:   Gastroesophageal reflux disease, unspecified whether esophagitis present  Chronic rhinitis  Allergy with anaphylaxis due to food  Mild intermittent asthma, uncomplicated  Contact dermatitis - needs patch testing  Plan/Recommendations:   1. Chronic rhinitis - with rhinitis medicamentosa from Afrin use - Try to limit the Afrin use to 3-5 days at a time. - RESTART Flonase  (fluticasone ) one spray per nostril twice daily. - Stop the cetirizine  and start levocetirizine 5 mg up to twice daily. - Because of insurance stipulations, we cannot do skin testing on the same day as your first visit. - We are all working to fight this, but for now we need to do two separate visits.  - We will know more after we do testing at the next visit.  - The skin testing visit can be squeezed in at your convenience.  - Then we can make a more full plan to address all of your symptoms. - Be sure to stop your antihistamines for 3 days before this appointment.   2. Allergy with anaphylaxis due to food - rash  - We will test for tomato and lemon at the next visit. - We can look at that more next time.   3. Gastroesophageal reflux disease - Continue with the pantoprazole  twice daily as you are doing.   4. Mild intermittent asthma, uncomplicated - Lung testing looks great today. - Because you are using albuterol  so frequently, we are going to start a daily inhaled called Advair, which has an inhaled steroid combined with a long-acting albuterol . - We will start Advair (device demonstrated in clinic). - Daily controller medication(s): Advair 250/64mcg one puff once daily - Prior to physical activity: albuterol  2 puffs 10-15 minutes before physical activity. - Rescue medications: albuterol  4 puffs every 4-6 hours as needed - Changes during respiratory infections or worsening symptoms: Increase  Advair to 1 puff twice daily for TWO WEEKS. - Asthma control goals:  * Full participation in all desired activities (may need albuterol  before activity) * Albuterol  use two time or less a week on average (not counting use with activity) * Cough interfering with sleep two time or less a month * Oral steroids no more than once a year * No hospitalizations  5. Return in about 1 week (around 10/09/2023) for ALLERGY TESTING (1-55 + tomato + lemon + oranage). You can have the follow up appointment with Dr. Iva or a Nurse Practicioner (our Nurse Practitioners are excellent and always have Physician oversight!).    This note in its entirety was forwarded to the Provider who requested this consultation.  Subjective:   [name] Walker is a 63 y.o. female presenting today for evaluation of  Chief Complaint  Patient presents with   Allergic Rhinitis     Allergic to grass and some perfumes. Experiences sneezing and coughing. Had a flare up over the weekend after the grass was cut.    Allergic Reaction    A month ago had a re action to tomato sauce. Experienced itchy tongue and itching all around. No other symptoms. Took some Benadryl . Also experiences these symptoms with lemons    [name] Walker has a history of the following: Patient Active Problem List   Diagnosis Date Noted   Insect bite 07/29/2022   Non-recurrent acute serous otitis media of left ear 12/05/2021   Left otitis media 10/26/2021   Bacterial ear  infection, bilateral 10/17/2021   Pain of right sacroiliac joint 10/07/2021   Chronic low back pain with left-sided sciatica 03/31/2020   Lumbar disc disease 03/02/2020   Left sided sciatica 01/31/2020   Depression 01/29/2020   Hematuria 01/29/2020   Left-sided weakness 10/31/2019   Balance disorder 10/31/2019   Difficulty with speech 10/02/2019   Insomnia 10/02/2019   HLD (hyperlipidemia) 04/14/2019   Acute otalgia, bilateral 04/14/2019   Vitamin D  deficiency 04/14/2019    Anxiety with depression 10/06/2018   Neck pain 09/10/2018   Hand swelling 09/10/2018   Bilateral radiating leg pain 04/23/2018   Myalgia 04/23/2018   Right renal stone 04/03/2018   Smoker 04/29/2016   Bilateral hand pain 04/29/2016   HNP (herniated nucleus pulposus), cervical 04/07/2016   Cervical radiculopathy, acute 01/29/2016   Acute upper respiratory infection 01/29/2016   Degenerative disc disease, cervical 01/11/2016   Hyperglycemia 11/17/2015   Angioedema 10/12/2015   Bilateral lower extremity edema 10/12/2015   Mouth sores 10/12/2015   Hypokalemia 10/12/2015   Thoracic scoliosis 10/01/2015   Peripheral edema 10/01/2015   Eczema 10/01/2015   Asthma 10/01/2015   Breast lump on right side at 4 o'clock position 01/21/2014   Hot flashes 10/01/2013   Lumbar radiculopathy 07/17/2012   Grief reaction 07/17/2012   Family history of colon cancer 07/02/2012   OAB (overactive bladder) 03/01/2012   Encounter for well adult exam with abnormal findings 08/23/2010   Plantar fasciitis 08/23/2010   Dysuria 01/05/2010   UTI (urinary tract infection) 10/19/2008   ANKLE PAIN, BILATERAL 10/19/2008   VAGINITIS 09/10/2008   JOINT EFFUSION, ANKLE 09/10/2008   SUBACROMIAL BURSITIS, RIGHT 09/01/2008   ACHILLES TENDINITIS 09/01/2008   LATERAL EPICONDYLITIS, RIGHT 08/18/2008   Essential hypertension 07/12/2007   Allergic rhinitis 07/12/2007   GERD 07/12/2007   Chronic low back pain 07/12/2007   NEPHROLITHIASIS, HX OF 07/12/2007    History obtained from: chart review and patient.  Discussed the use of AI scribe software for clinical note transcription with the patient and/or guardian, who gave verbal consent to proceed.  [name] Walker was referred by Norleen Lynwood ORN, MD.     [name] Walker is a 63 y.o. female presenting for an evaluation of multiple atopic complaints.  Asthma/Respiratory Symptom History: She uses her emergency inhaler approximately once a day, which provides relief. Her  asthma was diagnosed in her 30s or 13s, and she has never been on daily asthma medication like inhaled steroids. She smokes half a pack per day and has attempted to quit in the past, managing to stop for six months before resuming.  Allergic Rhinitis Symptom History: She has a history of environmental allergies that persist throughout the year, with exacerbations in the summer. She has tried Claritin and Zyrtec  without relief and frequently uses Afrin nasal spray. She also uses Flonase  but had run out recently. She experiences frequent sinus infections.  Food Allergy Symptom History: She has allergies to tomatoes and lemons, causing rashes, and cannot consume citrus fruits like oranges or grapefruit. She also reports issues with certain metals in jewelry.  GERD Symptom History: She has a history of acid reflux, managed with pantoprazole , taken once a day for several years.  Socially, she is retired and spends her time with her three grandchildren, who also visit the same medical practice.   Otherwise, there is no history of other atopic diseases, including drug allergies, stinging insect allergies, or contact dermatitis. There is no significant infectious history. Vaccinations are up to date.  Past Medical History: Patient Active Problem List   Diagnosis Date Noted   Insect bite 07/29/2022   Non-recurrent acute serous otitis media of left ear 12/05/2021   Left otitis media 10/26/2021   Bacterial ear infection, bilateral 10/17/2021   Pain of right sacroiliac joint 10/07/2021   Chronic low back pain with left-sided sciatica 03/31/2020   Lumbar disc disease 03/02/2020   Left sided sciatica 01/31/2020   Depression 01/29/2020   Hematuria 01/29/2020   Left-sided weakness 10/31/2019   Balance disorder 10/31/2019   Difficulty with speech 10/02/2019   Insomnia 10/02/2019   HLD (hyperlipidemia) 04/14/2019   Acute otalgia, bilateral 04/14/2019   Vitamin D  deficiency 04/14/2019   Anxiety with  depression 10/06/2018   Neck pain 09/10/2018   Hand swelling 09/10/2018   Bilateral radiating leg pain 04/23/2018   Myalgia 04/23/2018   Right renal stone 04/03/2018   Smoker 04/29/2016   Bilateral hand pain 04/29/2016   HNP (herniated nucleus pulposus), cervical 04/07/2016   Cervical radiculopathy, acute 01/29/2016   Acute upper respiratory infection 01/29/2016   Degenerative disc disease, cervical 01/11/2016   Hyperglycemia 11/17/2015   Angioedema 10/12/2015   Bilateral lower extremity edema 10/12/2015   Mouth sores 10/12/2015   Hypokalemia 10/12/2015   Thoracic scoliosis 10/01/2015   Peripheral edema 10/01/2015   Eczema 10/01/2015   Asthma 10/01/2015   Breast lump on right side at 4 o'clock position 01/21/2014   Hot flashes 10/01/2013   Lumbar radiculopathy 07/17/2012   Grief reaction 07/17/2012   Family history of colon cancer 07/02/2012   OAB (overactive bladder) 03/01/2012   Encounter for well adult exam with abnormal findings 08/23/2010   Plantar fasciitis 08/23/2010   Dysuria 01/05/2010   UTI (urinary tract infection) 10/19/2008   ANKLE PAIN, BILATERAL 10/19/2008   VAGINITIS 09/10/2008   JOINT EFFUSION, ANKLE 09/10/2008   SUBACROMIAL BURSITIS, RIGHT 09/01/2008   ACHILLES TENDINITIS 09/01/2008   LATERAL EPICONDYLITIS, RIGHT 08/18/2008   Essential hypertension 07/12/2007   Allergic rhinitis 07/12/2007   GERD 07/12/2007   Chronic low back pain 07/12/2007   NEPHROLITHIASIS, HX OF 07/12/2007    Medication List:  Allergies as of 10/02/2023       Reactions   Flexeril  [cyclobenzaprine ] Swelling   Tongue swells   Lisinopril  Other (See Comments)   Possible tongue swelling    Other Palpitations   ALL MUSCLE RELAXERS-PER PATIENT   Robaxin  [methocarbamol ] Other (See Comments)   Tongue swelling   Contrast Media [iodinated Contrast Media] Itching   Iodine Itching   Gabapentin  Other (See Comments)   Pregabalin Anxiety, Swelling   Anxiety attacks         Medication List        Accurate as of October 02, 2023 11:59 PM. If you have any questions, ask your nurse or doctor.          STOP taking these medications    cetirizine  10 MG tablet Commonly known as: ZYRTEC  Stopped by: Marty Morton Shaggy       TAKE these medications    acetaminophen -codeine  300-60 MG tablet Commonly known as: TYLENOL  #4 Take 1 tablet by mouth every 4 (four) hours as needed for moderate pain (pain score 4-6).   albuterol  108 (90 Base) MCG/ACT inhaler Commonly known as: VENTOLIN  HFA INHALE 2 PUFFS BY MOUTH EVERY 6 HOURS AS NEEDED FOR WHEEZING FOR SHORTNESS OF BREATH   alendronate  70 MG tablet Commonly known as: FOSAMAX  Take 1 tablet (70 mg total) by mouth every 7 (seven) days. Take with a  full glass of water on an empty stomach.   amLODipine  5 MG tablet Commonly known as: NORVASC  Take 1 tablet by mouth once daily   aspirin  EC 81 MG tablet Take 1 tablet (81 mg total) by mouth daily. Swallow whole.   atorvastatin  20 MG tablet Commonly known as: Lipitor Take 1 tablet (20 mg total) by mouth daily.   citalopram  40 MG tablet Commonly known as: CELEXA  Take 1 tablet by mouth once daily   fluticasone  50 MCG/ACT nasal spray Commonly known as: FLONASE  Place 1 spray into both nostrils in the morning and at bedtime. Started by: Marty Morton Shaggy   fluticasone -salmeterol 250-50 MCG/ACT Aepb Commonly known as: Advair Diskus Inhale 1 puff into the lungs daily. Can increase to twice daily during respiratory flares. Started by: Marty Morton Shaggy   levocetirizine 5 MG tablet Commonly known as: XYZAL  Take 1 tablet (5 mg total) by mouth every evening. Started by: Marty Morton Shaggy   lidocaine  5 % Commonly known as: Lidoderm  Place 1 patch onto the skin daily. Remove & Discard patch within 12 hours or as directed by MD   LORazepam  1 MG tablet Commonly known as: ATIVAN  Take 1 tablet (1 mg total) by mouth 2 (two) times daily as needed for  anxiety.   losartan  50 MG tablet Commonly known as: COZAAR  Take 1 tablet by mouth once daily   pantoprazole  40 MG tablet Commonly known as: PROTONIX  TAKE 1 TABLET BY MOUTH TWICE DAILY BEFORE A MEAL   Thera-D 2000 50 MCG (2000 UT) Tabs Generic drug: Cholecalciferol 1 tab by mouth once daily   traZODone  100 MG tablet Commonly known as: DESYREL  Take 1 tablet (100 mg total) by mouth at bedtime.        Birth History: non-contributory  Developmental History: non-contributory  Past Surgical History: Past Surgical History:  Procedure Laterality Date   ANTERIOR CERVICAL DECOMP/DISCECTOMY FUSION N/A 04/07/2016   Procedure: Cervical two-three Anterior cervical decompression/discectomy/fusion;  Surgeon: Rockey Peru, MD;  Location: [facility];  Service: Neurosurgery;  Laterality: N/A;   BACK SURGERY  2015   lower back   CYSTOSCOPY/URETEROSCOPY/HOLMIUM LASER/STENT PLACEMENT Right 11/21/2022   Procedure: CYSTOSCOPY RIGHT RETROGRADE PYELOGRAM RIGHT URETEROSCOPY/HOLMIUM LASER/STENT PLACEMENT;  Surgeon: Nieves Cough, MD;  Location: [facility];  Service: Urology;  Laterality: Right;  60 MINS FOR CASE   EXTRACORPOREAL SHOCK WAVE LITHOTRIPSY Left 09/28/2016   Procedure: LEFT EXTRACORPOREAL SHOCK WAVE LITHOTRIPSY (ESWL);  Surgeon: Nieves Cough, MD;  Location: [facility];  Service: Urology;  Laterality: Left;   EXTRACORPOREAL SHOCK WAVE LITHOTRIPSY Right 04/08/2018   Procedure: EXTRACORPOREAL SHOCK WAVE LITHOTRIPSY (ESWL);  Surgeon: Cam Morene ORN, MD;  Location: [facility];  Service: Urology;  Laterality: Right;   KIDNEY SURGERY     LITHOTRIPSY  05/2016   TUBAL LIGATION       Family History: Family History  Problem Relation Age of Onset   Diabetes Mother    Dementia Mother    Alzheimer's disease Mother    Colon cancer Father        dx in his 39s   Prostate cancer Father    Colon cancer Sister        dx in her 86s   Breast cancer Maternal Aunt        50's   Esophageal cancer Neg Hx     Stomach cancer Neg Hx    Crohn's disease Neg Hx    Rectal cancer Neg Hx      Social History: Lallie lives at home with  her family.  She lives in a house in associated home.  There are Throughout the home.  They have electric heating and central cooling.  There are no animals inside or outside of the home.  There are no dust mite covers on the bedding.  There is no tobacco exposure in the house, but she does smoke in the car.  She is currently on disability.  There is no fume, chemical, or dust exposure.  There is no HEPA filter in the home.  [place].  She has been a smoker for 10 years.   Review of systems otherwise negative other than that mentioned in the HPI.    Objective:   Blood pressure 130/82, pulse 72, temperature 97.6 F (36.4 C), temperature source Temporal, resp. rate 20, height 5' (1.524 m), weight 154 lb 8 oz (70.1 kg), last menstrual period 08/22/2010, SpO2 98%. Body mass index is 30.17 kg/m.     Physical Exam Vitals reviewed.  Constitutional:      Appearance: She is well-developed.     Comments: Talkative.  HENT:     Head: Normocephalic and atraumatic.     Right Ear: Tympanic membrane, ear canal and external ear normal. No drainage, swelling or tenderness. Tympanic membrane is not injected, scarred, erythematous, retracted or bulging.     Left Ear: Tympanic membrane, ear canal and external ear normal. No drainage, swelling or tenderness. Tympanic membrane is not injected, scarred, erythematous, retracted or bulging.     Nose: No nasal deformity, septal deviation, mucosal edema or rhinorrhea.     Right Turbinates: Enlarged, swollen and pale.     Left Turbinates: Enlarged, swollen and pale.     Right Sinus: No maxillary sinus tenderness or frontal sinus tenderness.     Left Sinus: No maxillary sinus tenderness or frontal sinus tenderness.     Mouth/Throat:     Mouth: Mucous membranes are not pale and not dry.     Pharynx:  Uvula midline.  Eyes:     General:        Right eye: No discharge.        Left eye: No discharge.     Conjunctiva/sclera: Conjunctivae normal.     Right eye: Right conjunctiva is not injected. No chemosis.    Left eye: Left conjunctiva is not injected. No chemosis.    Pupils: Pupils are equal, round, and reactive to light.  Cardiovascular:     Rate and Rhythm: Normal rate and regular rhythm.     Heart sounds: Normal heart sounds.  Pulmonary:     Effort: Pulmonary effort is normal. No tachypnea, accessory muscle usage or respiratory distress.     Breath sounds: Normal breath sounds. No wheezing, rhonchi or rales.  Chest:     Chest wall: No tenderness.  Abdominal:     Tenderness: There is no abdominal tenderness. There is no guarding or rebound.  Lymphadenopathy:     Head:     Right side of head: No submandibular, tonsillar or occipital adenopathy.     Left side of head: No submandibular, tonsillar or occipital adenopathy.     Cervical: No cervical adenopathy.  Skin:    Coloration: Skin is not pale.     Findings: No abrasion, erythema, petechiae or rash. Rash is not papular, urticarial or vesicular.  Neurological:     Mental Status: She is alert.  Psychiatric:        Behavior: Behavior is cooperative.  Diagnostic studies:    Spirometry: results normal (FEV1: 1.37/76%, FVC: 1.72/76%, FEV1/FVC: 80%).    Spirometry consistent with normal pattern.   Allergy Studies: deferred due to insurance stipulations that require a separate visit for testing           Marty Shaggy, MD [facility] 

## 2023-10-02 NOTE — Patient Instructions (Addendum)
 1. Chronic rhinitis - with rhinitis medicamentosa from Afrin use - Try to limit the Afrin use to 3-5 days at a time. - RESTART Flonase  (fluticasone ) one spray per nostril twice daily. - Stop the cetirizine  and start levocetirizine 5 mg up to twice daily. - Because of insurance stipulations, we cannot do skin testing on the same day as your first visit. - We are all working to fight this, but for now we need to do two separate visits.  - We will know more after we do testing at the next visit.  - The skin testing visit can be squeezed in at your convenience.  - Then we can make a more full plan to address all of your symptoms. - Be sure to stop your antihistamines for 3 days before this appointment.   2. Allergy with anaphylaxis due to food - rash  - We will test for tomato and lemon at the next visit. - We can look at that more next time.   3. Gastroesophageal reflux disease - Continue with the pantoprazole  twice daily as you are doing.   4. Mild intermittent asthma, uncomplicated - Lung testing looks great today. - Because you are using albuterol  so frequently, we are going to start a daily inhaled called Advair, which has an inhaled steroid combined with a long-acting albuterol . - We will start Advair (device demonstrated in clinic). - Daily controller medication(s): Advair 250/13mcg one puff once daily - Prior to physical activity: albuterol  2 puffs 10-15 minutes before physical activity. - Rescue medications: albuterol  4 puffs every 4-6 hours as needed - Changes during respiratory infections or worsening symptoms: Increase Advair to 1 puff twice daily for TWO WEEKS. - Asthma control goals:  * Full participation in all desired activities (may need albuterol  before activity) * Albuterol  use two time or less a week on average (not counting use with activity) * Cough interfering with sleep two time or less a month * Oral steroids no more than once a year * No hospitalizations  5.  Return in about 1 week (around 10/09/2023) for ALLERGY TESTING (1-55 + tomato + lemon + oranage). You can have the follow up appointment with Dr. Iva or a Nurse Practicioner (our Nurse Practitioners are excellent and always have Physician oversight!).    Please inform us  of any Emergency Department visits, hospitalizations, or changes in symptoms. Call us  before going to the ED for breathing or allergy symptoms since we might be able to fit you in for a sick visit. Feel free to contact us  anytime with any questions, problems, or concerns.  It was a pleasure to meet you today!  Websites that have reliable patient information: 1. American Academy of Asthma, Allergy, and Immunology: www.aaaai.org 2. Food Allergy Research and Education (FARE): foodallergy.org 3. Mothers of Asthmatics: http://www.asthmacommunitynetwork.org 4. American College of Allergy, Asthma, and Immunology: www.acaai.org      "Like" us  on Facebook and Instagram for our latest updates!      A healthy democracy works best when [employer] participate! Make sure you are registered to vote! If you have moved or changed any of your contact information, you will need to get this updated before voting! Scan the QR codes below to learn more!

## 2023-10-04 ENCOUNTER — Encounter: Payer: Self-pay | Admitting: Allergy & Immunology

## 2023-10-11 ENCOUNTER — Encounter: Payer: Self-pay | Admitting: Allergy & Immunology

## 2023-10-11 ENCOUNTER — Ambulatory Visit (INDEPENDENT_AMBULATORY_CARE_PROVIDER_SITE_OTHER): Admitting: Allergy & Immunology

## 2023-10-11 VITALS — Wt 155.2 lb

## 2023-10-11 DIAGNOSIS — T7800XA Anaphylactic reaction due to unspecified food, initial encounter: Secondary | ICD-10-CM

## 2023-10-11 DIAGNOSIS — T7800XD Anaphylactic reaction due to unspecified food, subsequent encounter: Secondary | ICD-10-CM

## 2023-10-11 DIAGNOSIS — J302 Other seasonal allergic rhinitis: Secondary | ICD-10-CM

## 2023-10-11 DIAGNOSIS — J4520 Mild intermittent asthma, uncomplicated: Secondary | ICD-10-CM

## 2023-10-11 DIAGNOSIS — K219 Gastro-esophageal reflux disease without esophagitis: Secondary | ICD-10-CM

## 2023-10-11 DIAGNOSIS — J3089 Other allergic rhinitis: Secondary | ICD-10-CM

## 2023-10-11 NOTE — Patient Instructions (Addendum)
 1. Chronic rhinitis - with rhinitis medicamentosa from Afrin use - Testing today showed: indoor molds and outdoor molds - Copy of test results provided.  - Avoidance measures provided. - Continue with: Xyzal  (levocetirizine) 5mg  tablet once daily and Flonase  (fluticasone ) one spray per nostril daily (AIM FOR EAR ON EACH SIDE) - You can use an extra dose of the antihistamine, if needed, for breakthrough symptoms.  - Consider nasal saline rinses 1-2 times daily to remove allergens from the nasal cavities as well as help with mucous clearance (this is especially helpful to do before the nasal sprays are given) - Consider allergy  shots as a means of long-term control. - Allergy  shots re-train and reset the immune system to ignore environmental allergens and decrease the resulting immune response to those allergens (sneezing, itchy watery eyes, runny nose, nasal congestion, etc).    - Allergy  shots improve symptoms in 75-85% of patients.  - We can discuss more at the next appointment if the medications are not working for you. - I am not sure that allergy  shots would be helpful with just the sensitizations to mold.   2. Allergy  with anaphylaxis due to food - rash  - Testing to the foods that we tested was negative which is good news. - I do not think that you need an EpiPen .   3. Gastroesophageal reflux disease - Continue with the pantoprazole  twice daily.  4. Mild intermittent asthma, uncomplicated - Lung testing looked great at the last visit. - Definitely start taking the Advair to see how that goes.  - Daily controller medication(s): Advair 250/31mcg one puff once daily - Prior to physical activity: albuterol  2 puffs 10-15 minutes before physical activity. - Rescue medications: albuterol  4 puffs every 4-6 hours as needed - Changes during respiratory infections or worsening symptoms: Increase Advair to 1 puff twice daily for TWO WEEKS. - Asthma control goals:  * Full participation in  all desired activities (may need albuterol  before activity) * Albuterol  use two time or less a week on average (not counting use with activity) * Cough interfering with sleep two time or less a month * Oral steroids no more than once a year * No hospitalizations  5. Return in about 3 months (around 01/11/2024). You can have the follow up appointment with Dr. Iva or a Nurse Practicioner (our Nurse Practitioners are excellent and always have Physician oversight!).    Please inform us  of any Emergency Department visits, hospitalizations, or changes in symptoms. Call us  before going to the ED for breathing or allergy  symptoms since we might be able to fit you in for a sick visit. Feel free to contact us  anytime with any questions, problems, or concerns.  It was a pleasure to meet you today!  Websites that have reliable patient information: 1. American Academy of Asthma, Allergy , and Immunology: www.aaaai.org 2. Food Allergy  Research and Education (FARE): foodallergy.org 3. Mothers of Asthmatics: http://www.asthmacommunitynetwork.org 4. American College of Allergy , Asthma, and Immunology: www.acaai.org      "Like" us  on Facebook and Instagram for our latest updates!      A healthy democracy works best when [employer] participate! Make sure you are registered to vote! If you have moved or changed any of your contact information, you will need to get this updated before voting! Scan the QR codes below to learn more!        Airborne Adult Perc - 10/11/23 1100     Time Antigen Placed 1050    Allergen Manufacturer Jestine  Location Back    Number of Test 55    1. Control-Buffer 50% Glycerol Negative    2. Control-Histamine --   1+   3. Bahia Negative    4. French Southern Territories Negative    5. Johnson Negative    6. [place]  Blue Negative    7. Meadow Fescue Negative    8. Perennial Rye Negative    9. Timothy Negative    10. Ragweed Mix Negative    11. Cocklebur Negative    12.  Plantain,  English Negative    13. Baccharis Negative    14. Dog Fennel Negative    15. Russian Thistle Negative    16. Lamb's Quarters Negative    17. Sheep Sorrell Negative    18. Rough Pigweed Negative    19. Marsh Elder, Rough Negative    20. Mugwort, Common Negative    21. Box, Elder Negative    22. Cedar, red Negative    23. Sweet Gum Negative    24. Pecan Pollen Negative    25. Pine Mix Negative    26. Walnut, Black Pollen Negative    27. Red Mulberry Negative    28. Ash Mix Negative    29. Birch Mix Negative    30. Beech American Negative    31. Cottonwood, Guinea-Bissau Negative    32. Hickory, White Negative    33. Maple Mix Negative    34. Oak, [place] Mix Negative    35. Sycamore Eastern Negative    36. Alternaria Alternata Negative    37. Cladosporium Herbarum Negative    38. Aspergillus Mix Negative    39. Penicillium Mix Negative    40. Bipolaris Sorokiniana (Helminthosporium) Negative    41. Drechslera Spicifera (Curvularia) Negative    42. Mucor Plumbeus Negative    43. Fusarium Moniliforme Negative    44. Aureobasidium Pullulans (pullulara) Negative    45. Rhizopus Oryzae Negative    46. Botrytis Cinera Negative    47. Epicoccum Nigrum Negative    48. Phoma Betae Negative    49. Dust Mite Mix Negative    50. Cat Hair 10,000 BAU/ml Negative    51.  Dog Epithelia Negative    52. Mixed Feathers Negative    53. Horse Epithelia Negative    54. Cockroach, German Negative    55. Tobacco Leaf Negative          Intradermal - 10/11/23 1100     Time Antigen Placed 1113    Allergen Manufacturer Jestine    Location Arm    Number of Test 16    Control Negative    Bahia Negative    French Southern Territories Negative    Johnson Negative    7 Grass Negative    Ragweed Mix Negative    Weed Mix Negative    Tree Mix Negative    Mold 1 Negative    Mold 2 2+    Mold 3 2+    Mold 4 Negative    Mite Mix Negative    Cat Negative    Dog Negative    Cockroach Negative           Food Adult Perc - 10/11/23 1100     Time Antigen Placed 1050    Allergen Manufacturer Greer    Location Back    Number of allergen test 3    38. Tomato Negative    55. Orange  Negative    56. Lemon Negative  Control of Mold Allergen   Mold and fungi can grow on a variety of surfaces provided certain temperature and moisture conditions exist.  Outdoor molds grow on plants, decaying vegetation and soil.  The major outdoor mold, Alternaria and Cladosporium, are found in very high numbers during hot and dry conditions.  Generally, a late Summer - Fall peak is seen for common outdoor fungal spores.  Rain will temporarily lower outdoor mold spore count, but counts rise rapidly when the rainy period ends.  The most important indoor molds are Aspergillus and Penicillium.  Dark, humid and poorly ventilated basements are ideal sites for mold growth.  The next most common sites of mold growth are the bathroom and the kitchen.  Outdoor (Seasonal) Mold Control  Positive outdoor molds via skin testing: Bipolaris (Helminthsporium), Drechslera (Curvalaria), and Mucor  Use air conditioning and keep windows closed Avoid exposure to decaying vegetation. Avoid leaf raking. Avoid grain handling. Consider wearing a face mask if working in moldy areas.   Indoor (Perennial) Mold Control   Positive indoor molds via skin testing: Aspergillus and Penicillium  Maintain humidity below 50%. Clean washable surfaces with 5% bleach solution. Remove sources e.g. contaminated carpets.

## 2023-10-11 NOTE — Progress Notes (Signed)
 FOLLOW UP  Date of Service/Encounter:  10/11/23   Assessment:   Gastroesophageal reflux disease, unspecified whether esophagitis present   Chronic rhinitis   Allergy  with anaphylaxis due to food   Mild intermittent asthma, uncomplicated   Contact dermatitis - needs patch testing  Plan/Recommendations:   1. Chronic rhinitis - with rhinitis medicamentosa from Afrin use - Testing today showed: indoor molds and outdoor molds - Copy of test results provided.  - Avoidance measures provided. - Continue with: Xyzal  (levocetirizine) 5mg  tablet once daily and Flonase  (fluticasone ) one spray per nostril daily (AIM FOR EAR ON EACH SIDE) - You can use an extra dose of the antihistamine, if needed, for breakthrough symptoms.  - Consider nasal saline rinses 1-2 times daily to remove allergens from the nasal cavities as well as help with mucous clearance (this is especially helpful to do before the nasal sprays are given) - Consider allergy  shots as a means of long-term control. - Allergy  shots re-train and reset the immune system to ignore environmental allergens and decrease the resulting immune response to those allergens (sneezing, itchy watery eyes, runny nose, nasal congestion, etc).    - Allergy  shots improve symptoms in 75-85% of patients.  - We can discuss more at the next appointment if the medications are not working for you. - I am not sure that allergy  shots would be helpful with just the sensitizations to mold.   2. Allergy  with anaphylaxis due to food - rash  - Testing to the foods that we tested was negative which is good news. - I do not think that you need an EpiPen .   3. Gastroesophageal reflux disease - Continue with the pantoprazole  twice daily.  4. Mild intermittent asthma, uncomplicated - Lung testing looked great at the last visit. - Definitely start taking the Advair to see how that goes.  - Daily controller medication(s): Advair 250/33mcg one puff once  daily - Prior to physical activity: albuterol  2 puffs 10-15 minutes before physical activity. - Rescue medications: albuterol  4 puffs every 4-6 hours as needed - Changes during respiratory infections or worsening symptoms: Increase Advair to 1 puff twice daily for TWO WEEKS. - Asthma control goals:  * Full participation in all desired activities (may need albuterol  before activity) * Albuterol  use two time or less a week on average (not counting use with activity) * Cough interfering with sleep two time or less a month * Oral steroids no more than once a year * No hospitalizations  5. Return in about 3 months (around 01/11/2024). You can have the follow up appointment with Dr. Iva or a Nurse Practicioner (our Nurse Practitioners are excellent and always have Physician oversight!).   Subjective:   [name] Walker is a 63 y.o. female presenting today for follow up of No chief complaint on file.   [name] Walker has a history of the following: Patient Active Problem List   Diagnosis Date Noted   Insect bite 07/29/2022   Non-recurrent acute serous otitis media of left ear 12/05/2021   Left otitis media 10/26/2021   Bacterial ear infection, bilateral 10/17/2021   Pain of right sacroiliac joint 10/07/2021   Chronic low back pain with left-sided sciatica 03/31/2020   Lumbar disc disease 03/02/2020   Left sided sciatica 01/31/2020   Depression 01/29/2020   Hematuria 01/29/2020   Left-sided weakness 10/31/2019   Balance disorder 10/31/2019   Difficulty with speech 10/02/2019   Insomnia 10/02/2019   HLD (hyperlipidemia) 04/14/2019   Acute otalgia,  bilateral 04/14/2019   Vitamin D  deficiency 04/14/2019   Anxiety with depression 10/06/2018   Neck pain 09/10/2018   Hand swelling 09/10/2018   Bilateral radiating leg pain 04/23/2018   Myalgia 04/23/2018   Right renal stone 04/03/2018   Smoker 04/29/2016   Bilateral hand pain 04/29/2016   HNP (herniated nucleus pulposus),  cervical 04/07/2016   Cervical radiculopathy, acute 01/29/2016   Acute upper respiratory infection 01/29/2016   Degenerative disc disease, cervical 01/11/2016   Hyperglycemia 11/17/2015   Angioedema 10/12/2015   Bilateral lower extremity edema 10/12/2015   Mouth sores 10/12/2015   Hypokalemia 10/12/2015   Thoracic scoliosis 10/01/2015   Peripheral edema 10/01/2015   Eczema 10/01/2015   Asthma 10/01/2015   Breast lump on right side at 4 o'clock position 01/21/2014   Hot flashes 10/01/2013   Lumbar radiculopathy 07/17/2012   Grief reaction 07/17/2012   Family history of colon cancer 07/02/2012   OAB (overactive bladder) 03/01/2012   Encounter for well adult exam with abnormal findings 08/23/2010   Plantar fasciitis 08/23/2010   Dysuria 01/05/2010   UTI (urinary tract infection) 10/19/2008   ANKLE PAIN, BILATERAL 10/19/2008   VAGINITIS 09/10/2008   JOINT EFFUSION, ANKLE 09/10/2008   SUBACROMIAL BURSITIS, RIGHT 09/01/2008   ACHILLES TENDINITIS 09/01/2008   LATERAL EPICONDYLITIS, RIGHT 08/18/2008   Essential hypertension 07/12/2007   Allergic rhinitis 07/12/2007   GERD 07/12/2007   Chronic low back pain 07/12/2007   NEPHROLITHIASIS, HX OF 07/12/2007    History obtained from: chart review and patient.  Discussed the use of AI scribe software for clinical note transcription with the patient and/or guardian, who gave verbal consent to proceed.  [name] Walker is a 63 y.o. female presenting for skin testing. She was last seen on August 5th. We could not do testing because her insurance company does not cover testing on the same day as a New Patient visit. She has been off of all antihistamines 3 days in anticipation of the testing.   At the last visit, we recommended restarting the Flonase .  We stopped cetirizine  and started levocetirizine 5 mg 1-2 times a day.  We did encourage her to limit her Afrin use.  She reported a rash with tomato and lemon.  We decided to do testing.  She continue  with pantoprazole  twice daily for GERD.  Her lung testing looked great.  She was using albuterol  frequently, so we started Advair 250/50 micrograms 1 puff once daily and albuterol  as needed.  Otherwise, there have been no changes to her past medical history, surgical history, family history, or social history.    Review of systems otherwise negative other than that mentioned in the HPI.    Objective:   Weight 155 lb 3.2 oz (70.4 kg), last menstrual period 08/22/2010. Body mass index is 30.31 kg/m.    Physical exam deferred since this was a skin testing appointment only.   Diagnostic studies:   Allergy  Studies:     Airborne Adult Perc - 10/11/23 1100     Time Antigen Placed 1050    Allergen Manufacturer Jestine    Location Back    Number of Test 55    1. Control-Buffer 50% Glycerol Negative    2. Control-Histamine --   1+   3. Bahia Negative    4. [place] Territ[place] Negative    5. Johnson Negative    6. [place]  Blue Negative    7. Meadow Fescue Negative    8. Perennial Rye Negative    9. Timothy Negative  10. Ragweed Mix Negative    11. Cocklebur Negative    12. Plantain,  English Negative    13. Baccharis Negative    14. Dog Fennel Negative    15. Russian Thistle Negative    16. Lamb's Quarters Negative    17. Sheep Sorrell Negative    18. Rough Pigweed Negative    19. Marsh Elder, Rough Negative    20. Mugwort, Common Negative    21. Box, Elder Negative    22. Cedar, red Negative    23. Sweet Gum Negative    24. Pecan Pollen Negative    25. Pine Mix Negative    26. Walnut, Black Pollen Negative    27. Red Mulberry Negative    28. Ash Mix Negative    29. Birch Mix Negative    30. Beech American Negative    31. Cottonwood, [place]iss[place] Negative    32. Hickory, White Negative    33. Maple Mix Negative    34. Oak, [place] Mix Negative    35. Sycamore Eastern Negative    36. Alternaria Alternata Negative    37. Cladosporium Herbarum Negative    38. Aspergillus Mix  Negative    39. Penicillium Mix Negative    40. Bipolaris Sorokiniana (Helminthosporium) Negative    41. Drechslera Spicifera (Curvularia) Negative    42. Mucor Plumbeus Negative    43. Fusarium Moniliforme Negative    44. Aureobasidium Pullulans (pullulara) Negative    45. Rhizopus Oryzae Negative    46. Botrytis Cinera Negative    47. Epicoccum Nigrum Negative    48. Phoma Betae Negative    49. Dust Mite Mix Negative    50. Cat Hair 10,000 BAU/ml Negative    51.  Dog Epithelia Negative    52. Mixed Feathers Negative    53. Horse Epithelia Negative    54. Cockroach, German Negative    55. Tobacco Leaf Negative          Intradermal - 10/11/23 1100     Time Antigen Placed 1113    Allergen Manufacturer Jestine    Location Arm    Number of Test 16    Control Negative    Bahia Negative    French Southern Territ[place] Negative    Johnson Negative    7 Grass Negative    Ragweed Mix Negative    Weed Mix Negative    Tree Mix Negative    Mold 1 Negative    Mold 2 2+    Mold 3 2+    Mold 4 Negative    Mite Mix Negative    Cat Negative    Dog Negative    Cockroach Negative          Food Adult Perc - 10/11/23 1100     Time Antigen Placed 1050    Allergen Manufacturer Greer    Location Back    Number of allergen test 3    38. Tomato Negative    55. Orange  Negative    56. Lemon Negative          Allergy  testing results were read and interpreted by myself, documented by clinical staff.      Marty Shaggy, MD  [facility] 

## 2023-10-30 ENCOUNTER — Telehealth: Admitting: Physician Assistant

## 2023-10-30 DIAGNOSIS — J069 Acute upper respiratory infection, unspecified: Secondary | ICD-10-CM | POA: Diagnosis not present

## 2023-10-30 MED ORDER — FLUTICASONE PROPIONATE 50 MCG/ACT NA SUSP: 2.0000 | Freq: Every day | NASAL | 0 refills | Status: AC

## 2023-10-30 MED ORDER — BENZONATATE 100 MG PO CAPS: 100.0000 mg | ORAL_CAPSULE | Freq: Three times a day (TID) | ORAL | 0 refills | Status: AC

## 2023-10-30 NOTE — Progress Notes (Signed)
 [name] Walker, [name] Walker are scheduled for a virtual visit with your provider today.    Just as we do with appointments in the office, we must obtain your consent to participate.  Your consent will be active for this visit and any virtual visit you may have with one of our providers in the next 365 days.    If you have a MyChart account, I can also send a copy of this consent to you electronically.  All virtual visits are billed to your insurance company just like a traditional visit in the office.  As this is a virtual visit, video technology does not allow for your provider to perform a traditional examination.  This may limit your provider's ability to fully assess your condition.  If your provider identifies any concerns that need to be evaluated in person or the need to arrange testing such as labs, EKG, etc, we will make arrangements to do so.    Although advances in technology are sophisticated, we cannot ensure that it will always work on either your end or our end.  If the connection with a video visit is poor, we may have to switch to a telephone visit.  With either a video or telephone visit, we are not always able to ensure that we have a secure connection.   I need to obtain your verbal consent now.   Are you willing to proceed with your visit today?   [name] Walker has provided verbal consent on 10/30/2023 for a virtual visit (video or telephone).   [name] Walker, [place] 10/30/2023  12:37 PM   Date:  10/30/2023   ID:  ANILAH HUCK, DOB 24-May-1960, MRN [id number]  Patient Location: Home Provider Location: Home Office   Participants: Patient and Provider for Visit and Wrap up  Method of visit: Video  Location of Patient: Home Location of Provider: Home Office Consent was obtain for visit over the video. Services rendered by provider: Visit was performed via video  A video enabled telemedicine application was used and I verified that I am speaking with the correct person using two  identifiers.  PCP:  Norleen Lynwood ORN, MD   Chief Complaint:  [name] Walker  History of Present Illness:    [name] Walker is a 63 y.o. female with history as stated below. Presents video telehealth for an acute care visit  Pt reports that she started having a sore throat, rhinorrhea, cough that started 3 days ago. Denies fevers but did have chills last night. Denies shortness of breath, chest pain, vomiting, diarrhea.  She states that her granddaughter stays with her and there has been a covid outbreak at school.  Has not taken a covid test  Past Medical, Surgical, Social History, Allergies, and Medications have been Reviewed.  Past Medical History:  Diagnosis Date   ACHILLES TENDINITIS    ALLERGIC RHINITIS    Allergy     Anginal pain (HCC) 04/04/2016   Pt currently feels like she is having muscle spasms and aching in her chest   Anxiety    Asthma    Chronic kidney disease    Depression    Eczema    GERD    History of kidney stones    Hyperlipidemia    HYPERTENSION    Kidney stone    LATERAL EPICONDYLITIS, RIGHT    LOW BACK PAIN    Plantar fascial fibromatosis    Sciatica    Stress    Stroke (HCC)    SUBACROMIAL BURSITIS, RIGHT  Thoracic scoliosis 10/01/2015   TIA (transient ischemic attack)     No outpatient medications have been marked as taking for the 10/30/23 encounter (Appointment) with [facility] PROVIDER.     Allergies:   Flexeril  [cyclobenzaprine ], Lisinopril , Other, Robaxin  [methocarbamol ], Contrast media [iodinated contrast media], Iodine, Gabapentin , and Pregabalin   ROS See HPI for history of present illness.  Physical Exam Constitutional:      Appearance: Normal appearance.  Pulmonary:     Effort: Pulmonary effort is normal.  Neurological:     Mental Status: She is alert.               MDM: Pt with uri sxs, likely viral in nature. Rx for medications for supportive care given   Tests Ordered: No orders of the defined types were placed in this  encounter.   Medication Changes: No orders of the defined types were placed in this encounter.    Disposition:  Follow up  Signed, [name] GORMAN Snuffer, PA-C  10/30/2023 12:37 PM

## 2023-10-31 ENCOUNTER — Ambulatory Visit: Admitting: Internal Medicine

## 2023-11-01 ENCOUNTER — Encounter: Payer: Self-pay | Admitting: Internal Medicine

## 2023-11-01 ENCOUNTER — Telehealth (INDEPENDENT_AMBULATORY_CARE_PROVIDER_SITE_OTHER): Admitting: Internal Medicine

## 2023-11-01 DIAGNOSIS — I10 Essential (primary) hypertension: Secondary | ICD-10-CM | POA: Diagnosis not present

## 2023-11-01 DIAGNOSIS — F418 Other specified anxiety disorders: Secondary | ICD-10-CM

## 2023-11-01 DIAGNOSIS — U071 COVID-19: Secondary | ICD-10-CM | POA: Diagnosis not present

## 2023-11-01 DIAGNOSIS — E559 Vitamin D deficiency, unspecified: Secondary | ICD-10-CM | POA: Diagnosis not present

## 2023-11-01 DIAGNOSIS — R739 Hyperglycemia, unspecified: Secondary | ICD-10-CM

## 2023-11-01 MED ORDER — NIRMATRELVIR/RITONAVIR (PAXLOVID)TABLET: 3.0000 | ORAL_TABLET | Freq: Two times a day (BID) | ORAL | 0 refills | Status: AC

## 2023-11-01 MED ORDER — HYDROCODONE BIT-HOMATROP MBR 5-1.5 MG/5ML PO SOLN: 5.0000 mL | Freq: Four times a day (QID) | ORAL | 0 refills | Status: AC | PRN

## 2023-11-01 MED ORDER — CITALOPRAM HYDROBROMIDE 40 MG PO TABS: 40.0000 mg | ORAL_TABLET | Freq: Every day | ORAL | 3 refills | Status: AC

## 2023-11-01 MED ORDER — AMLODIPINE BESYLATE 5 MG PO TABS: 5.0000 mg | ORAL_TABLET | Freq: Every day | ORAL | 3 refills | Status: AC

## 2023-11-01 NOTE — Patient Instructions (Signed)
Please take all new medication as prescribed  Please continue all other medications as before, and refills have been done if requested.  Please have the pharmacy call with any other refills you may need.  Please continue your efforts at being more active, low cholesterol diet, and weight control.  Please keep your appointments with your specialists as you may have planned    

## 2023-11-01 NOTE — Assessment & Plan Note (Signed)
Last vitamin D Lab Results  Component Value Date   VD25OH 40.95 07/28/2022   Stable, cont oral replacement

## 2023-11-01 NOTE — Assessment & Plan Note (Signed)
 With mild worsening this past wk with running out of the celexa  - ok for refill 40 mg qd

## 2023-11-01 NOTE — Assessment & Plan Note (Signed)
 Mild to mod, for antibx course paxlovid  course, cough med prn,,  to f/u any worsening symptoms or concerns

## 2023-11-01 NOTE — Progress Notes (Signed)
 Patient ID: [name] Walker, female   DOB: Aug 06, 1960, 63 y.o.   MRN: [id number]  Virtual Visit via Video Note  I connected with [name] Walker on 11/01/23 at  8:40 AM EDT by a video enabled telemedicine application and verified that I am speaking with the correct person using two identifiers.  Location:of all participants today Patient: at home Provider:  at office   I discussed the limitations of evaluation and management by telemedicine and the availability of in person appointments. The patient expressed understanding and agreed to proceed.  History of Present Illness: Here with 3 days sinus nasal congestion , feeling ill, fatigued, malaise with low grade temp, ST and scan t prod cough.  Pt denies chest pain, increased sob or doe, wheezing, orthopnea, PND, increased LE swelling, palpitations, dizziness or syncope.  Pt did test Positive for COVD infection yesterday.  Grandchild ill with same.    Past Medical History:  Diagnosis Date   ACHILLES TENDINITIS    ALLERGIC RHINITIS    Allergy     Anginal pain (HCC) 04/04/2016   Pt currently feels like she is having muscle spasms and aching in her chest   Anxiety    Asthma    Chronic kidney disease    Depression    Eczema    GERD    History of kidney stones    Hyperlipidemia    HYPERTENSION    Kidney stone    LATERAL EPICONDYLITIS, RIGHT    LOW BACK PAIN    Plantar fascial fibromatosis    Sciatica    Stress    Stroke (HCC)    SUBACROMIAL BURSITIS, RIGHT    Thoracic scoliosis 10/01/2015   TIA (transient ischemic attack)    Past Surgical History:  Procedure Laterality Date   ANTERIOR CERVICAL DECOMP/DISCECTOMY FUSION N/A 04/07/2016   Procedure: Cervical two-three Anterior cervical decompression/discectomy/fusion;  Surgeon: Rockey Peru, MD;  Location: [facility] OR;  Service: Neurosurgery;  Laterality: N/A;   BACK SURGERY  2015   lower back   CYSTOSCOPY/URETEROSCOPY/HOLMIUM LASER/STENT PLACEMENT Right 11/21/2022   Procedure: CYSTOSCOPY  RIGHT RETROGRADE PYELOGRAM RIGHT URETEROSCOPY/HOLMIUM LASER/STENT PLACEMENT;  Surgeon: Nieves Cough, MD;  Location: [facility];  Service: Urology;  Laterality: Right;  60 MINS FOR CASE   EXTRACORPOREAL SHOCK WAVE LITHOTRIPSY Left 09/28/2016   Procedure: LEFT EXTRACORPOREAL SHOCK WAVE LITHOTRIPSY (ESWL);  Surgeon: Nieves Cough, MD;  Location: [facility];  Service: Urology;  Laterality: Left;   EXTRACORPOREAL SHOCK WAVE LITHOTRIPSY Right 04/08/2018   Procedure: EXTRACORPOREAL SHOCK WAVE LITHOTRIPSY (ESWL);  Surgeon: Cam Morene ORN, MD;  Location: [facility];  Service: Urology;  Laterality: Right;   KIDNEY SURGERY     LITHOTRIPSY  05/2016   TUBAL LIGATION      reports that she has been smoking cigarettes. She has a 10 pack-year smoking history. She has never been exposed to tobacco smoke. She has never used smokeless tobacco. She reports that she does not drink alcohol and does not use drugs. family history includes Alzheimer's disease in her mother; Breast cancer in her maternal aunt; Colon cancer in her father and sister; Dementia in her mother; Diabetes in her mother; Prostate cancer in her father. Allergies  Allergen Reactions   Flexeril  [Cyclobenzaprine ] Swelling    Tongue swells   Lisinopril  Other (See Comments)    Possible tongue swelling     Other Palpitations    ALL MUSCLE RELAXERS-PER PATIENT   Robaxin  [Methocarbamol ] Other (See Comments)    Tongue swelling   Contrast Media [Iodinated Contrast Media]  Itching   Iodine Itching   Gabapentin  Other (See Comments)   Pregabalin Anxiety and Swelling    Anxiety attacks   Current Outpatient Medications on File Prior to Visit  Medication Sig Dispense Refill   acetaminophen -codeine  (TYLENOL  #4) 300-60 MG tablet Take 1 tablet by mouth every 4 (four) hours as needed for moderate pain (pain score 4-6). 60 tablet 2   albuterol  (VENTOLIN  HFA) 108 (90 Base) MCG/ACT inhaler INHALE 2 PUFFS BY MOUTH EVERY 6 HOURS AS NEEDED FOR WHEEZING FOR SHORTNESS  OF BREATH 18 g 0   alendronate  (FOSAMAX ) 70 MG tablet Take 1 tablet (70 mg total) by mouth every 7 (seven) days. Take with a full glass of water on an empty stomach. 12 tablet 3   aspirin  EC 81 MG tablet Take 1 tablet (81 mg total) by mouth daily. Swallow whole. 30 tablet 11   atorvastatin  (LIPITOR) 20 MG tablet Take 1 tablet (20 mg total) by mouth daily. 90 tablet 3   benzonatate  (TESSALON ) 100 MG capsule Take 1 capsule (100 mg total) by mouth every 8 (eight) hours for 5 days. 15 capsule 0   Cholecalciferol (THERA-D 2000) 50 MCG (2000 UT) TABS 1 tab by mouth once daily 30 tablet 99   fluticasone  (FLONASE ) 50 MCG/ACT nasal spray Place 1 spray into both nostrils in the morning and at bedtime. 16 g 5   fluticasone  (FLONASE ) 50 MCG/ACT nasal spray Place 2 sprays into both nostrils daily. 16 g 0   fluticasone -salmeterol (ADVAIR DISKUS) 250-50 MCG/ACT AEPB Inhale 1 puff into the lungs daily. Can increase to twice daily during respiratory flares. 60 each 5   levocetirizine (XYZAL ) 5 MG tablet Take 1 tablet (5 mg total) by mouth every evening. 90 tablet 1   lidocaine  (LIDODERM ) 5 % Place 1 patch onto the skin daily. Remove & Discard patch within 12 hours or as directed by MD 30 patch 2   LORazepam  (ATIVAN ) 1 MG tablet Take 1 tablet (1 mg total) by mouth 2 (two) times daily as needed for anxiety. 60 tablet 2   losartan  (COZAAR ) 50 MG tablet Take 1 tablet by mouth once daily 90 tablet 3   pantoprazole  (PROTONIX ) 40 MG tablet TAKE 1 TABLET BY MOUTH TWICE DAILY BEFORE A MEAL 180 tablet 3   traZODone  (DESYREL ) 100 MG tablet Take 1 tablet (100 mg total) by mouth at bedtime. 90 tablet 1   No current facility-administered medications on file prior to visit.   Observations/Objective: Alert, mild ill appearing with cough, appropriate mood and affect, resps normal, cn 2-12 intact, moves all 4s, no visible rash or swelling  Lab Results  Component Value Date   WBC 5.5 11/14/2022   HGB 14.4 11/14/2022   HCT 44.9  11/14/2022   PLT 132 (L) 11/14/2022   GLUCOSE 87 11/14/2022   CHOL 170 07/28/2022   TRIG 203.0 (H) 07/28/2022   HDL 40.70 07/28/2022   LDLDIRECT 103.0 07/28/2022   LDLCALC 46 06/30/2020   ALT 21 07/28/2022   AST 21 07/28/2022   NA 142 11/14/2022   K 3.9 11/14/2022   CL 110 11/14/2022   CREATININE 0.63 11/14/2022   BUN 14 11/14/2022   CO2 23 11/14/2022   TSH 0.43 07/28/2022   INR 0.9 09/30/2019   HGBA1C 5.0 07/28/2022   Assessment and Plan: See notes  Follow Up Instructions: See notes   I discussed the assessment and treatment plan with the patient. The patient was provided an opportunity to ask questions and all were answered.  The patient agreed with the plan and demonstrated an understanding of the instructions.   The patient was advised to call back or seek an in-person evaluation if the symptoms worsen or if the condition fails to improve as anticipated.   Lynwood Rush, MD

## 2023-11-01 NOTE — Assessment & Plan Note (Signed)
 Mild high uncontrolled per pt after running out of norvasc  - to restart and continue losartan  as well

## 2023-11-01 NOTE — Assessment & Plan Note (Signed)
Lab Results  Component Value Date   HGBA1C 5.0 07/28/2022   Stable, pt to continue current medical treatment  - diet, wt control

## 2023-11-05 ENCOUNTER — Encounter: Admitting: Family

## 2023-11-07 ENCOUNTER — Encounter: Admitting: Allergy

## 2023-11-09 ENCOUNTER — Encounter: Admitting: Allergy

## 2023-11-10 ENCOUNTER — Other Ambulatory Visit: Payer: Self-pay | Admitting: Internal Medicine

## 2023-12-03 ENCOUNTER — Ambulatory Visit (INDEPENDENT_AMBULATORY_CARE_PROVIDER_SITE_OTHER): Admitting: Family

## 2023-12-03 ENCOUNTER — Encounter: Payer: Self-pay | Admitting: Family

## 2023-12-03 DIAGNOSIS — L230 Allergic contact dermatitis due to metals: Secondary | ICD-10-CM | POA: Diagnosis not present

## 2023-12-03 NOTE — Progress Notes (Signed)
 Follow-up Note  RE: [name] Walker MRN:  [id number]  DOB:12-Jan-1961 Date of Office Visit: 12/03/2023  Primary care provider: Norleen Lynwood ORN, MD Referring provider: Norleen Lynwood ORN, MD   [name] returns to the office today for the patch test placement, given suspected history of contact dermatitis.      Diagnostics: Metals testing placed Chromium chloride 1%, potassium dichromate 0.25%, cobalt chloride hexahydrate 1%, copper sulfate pentahydrate 2%, molybdenum chloride 0.5%, titanium 0.1%, tantal, magnese chloride 0.5%, nickel sulfate hexahydrate 5%, aluminum hydroxide 10%, and vanadium pentoxide 10%.   Plan:   Allergic contact dermatitis Discussed with patient that patch testing tests for contact dermatitis and sometimes it does not correlate to how one will react to metals in the body. Positive patch testing results can help in avoiding those items however it is possible to get false negative results.  Nevertheless, this is the most accessible test for metal sensitivity currently available.  Metal Patches placed today. Please avoid strenuous physical activities and do not get the patches on the back wet. No showering for the next 48 hours Okay to take antihistamines for itching but avoid placing any creams on the back where the patches are. We will remove the patches on Wednesday and will do our initial read, next reading on Friday, and final reading on Monday  Wanda Craze, FNP [facility]

## 2023-12-04 NOTE — Progress Notes (Deleted)
   Follow Up Note  RE: [name] Walker MRN: [id number] DOB: [birth year] Date of Office Visit: 12/05/2023  Referring provider: Norleen Lynwood ORN, MD Primary care provider: Norleen Lynwood ORN, MD  History of Present Illness: I had the pleasure of seeing [name] Walker for a follow up visit at the [facility] on 12/05/2023. She is a 63 y.o. female, who is being followed for dermatitis. Today she is here for initial patch test interpretation, given suspected history of contact dermatitis.   Diagnostics:  NAC-80 Panel 48 hour reading:     Assessment and Plan: [name] Walker is a 63 y.o. female with: There are no diagnoses linked to this encounter. Patches removed and 48 hour reading was ***. The patient has been provided detailed information regarding the substances she is sensitive to, as well as products containing the substances.  Meticulous avoidance of these substances is recommended.   No follow-ups on file.  It was my pleasure to see [name] Walker today and participate in her care. Please feel free to contact me with any questions or concerns.  Sincerely,  Orlan Cramp, DO Allergy  & Immunology  [facility]  [place] office: [phone] [facility] office: [phone]

## 2023-12-05 ENCOUNTER — Encounter: Admitting: Allergy

## 2023-12-05 ENCOUNTER — Ambulatory Visit (INDEPENDENT_AMBULATORY_CARE_PROVIDER_SITE_OTHER): Admitting: Allergy

## 2023-12-05 ENCOUNTER — Encounter: Payer: Self-pay | Admitting: Allergy

## 2023-12-05 DIAGNOSIS — L230 Allergic contact dermatitis due to metals: Secondary | ICD-10-CM

## 2023-12-05 NOTE — Addendum Note (Signed)
 Addended by: MARCINE ISAIAH CROME on: 12/05/2023 04:33 PM   Modules accepted: Orders

## 2023-12-05 NOTE — Progress Notes (Signed)
   Follow Up Note  RE: [name] MRN: [id number] DOB: [birth year] Date of Office Visit: 12/05/2023  Primary care provider: Norleen Lynwood ORN, MD  History of Present Illness: I had the pleasure of seeing [name] Walker for a follow up visit at the [facility] on 12/05/2023. She is a 63 y.o. female, who is being followed for dermatitis. Today she is here for initial patch test interpretation, given suspected history of contact dermatitis.   Diagnostics:  Metal series patch 48 hour reading:  Negative    Assessment and Plan: [name] is a 63 y.o. female with contact dermatitis. : Patches removed and 48 hour reading was negative. She will return in 2 days for 96-hour reading and again at 7 days for final reading. Informational handouts will be provided on any positive allergens  It was my pleasure to see [name] Walker today and participate in her care.   Sincerely,  Danita Brain, MD [facility]

## 2023-12-07 ENCOUNTER — Encounter: Attending: Physical Medicine and Rehabilitation | Admitting: Physical Medicine & Rehabilitation

## 2023-12-07 ENCOUNTER — Ambulatory Visit: Admitting: Internal Medicine

## 2023-12-07 ENCOUNTER — Encounter: Payer: Self-pay | Admitting: Physical Medicine & Rehabilitation

## 2023-12-07 VITALS — BP 125/84 | HR 66 | Ht 60.0 in | Wt 154.0 lb

## 2023-12-07 DIAGNOSIS — Z5181 Encounter for therapeutic drug level monitoring: Secondary | ICD-10-CM | POA: Diagnosis not present

## 2023-12-07 DIAGNOSIS — Z79891 Long term (current) use of opiate analgesic: Secondary | ICD-10-CM | POA: Insufficient documentation

## 2023-12-07 DIAGNOSIS — L230 Allergic contact dermatitis due to metals: Secondary | ICD-10-CM | POA: Diagnosis not present

## 2023-12-07 DIAGNOSIS — G894 Chronic pain syndrome: Secondary | ICD-10-CM | POA: Insufficient documentation

## 2023-12-07 MED ORDER — ACETAMINOPHEN-CODEINE 300-60 MG PO TABS: 1.0000 | ORAL_TABLET | ORAL | 2 refills | Status: AC | PRN

## 2023-12-07 NOTE — Patient Instructions (Addendum)
  VISIT SUMMARY: You visited us  today due to worsening sciatic pain in both legs. We discussed your chronic low back pain, the results of your previous MRI, and your current pain management plan.  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 -We will administer L5-S1 epidural injections to help relieve your leg pain. -We will schedule lumbar medial branch blocks to help with your back pain. -If the medial branch blocks are effective, we may consider radiofrequency neurotomy. -If injections do not provide sufficient relief, we will discuss a surgical referral for decompression surgery.  CHRONIC PAIN SYNDROME: Your current pain management regimen is not providing adequate relief. -Continue taking Tylenol  #4 with codeine , up to three times daily as needed. -We reviewed your recent urine drug screen.                      Contains text generated by Abridge.                                 Contains text generated by Abridge.                          Contains text generated by Abridge.                                 Contains text generated by Abridge.

## 2023-12-07 NOTE — Progress Notes (Signed)
 Subjective:    Patient ID: [name] Walker, female    DOB: [birth year], 63 y.o.   MRN: [id number]  HPI Discussed the use of AI scribe software for clinical note transcription with the patient, who gave verbal consent to proceed.  History of Present Illness [name] Walker is a 63 year old female with chronic sciatic pain who presents with worsening bilateral leg pain.  She experiences persistent and severe sciatic pain that radiates past her knees to her calves on both sides. The pain is described as 'stabbing.' No numbness or tingling is associated with the pain. She experiences some weakness in her legs and has not had any falls. She had a bladder accident once but denies any ongoing issues with bowel or bladder function.  She has a history of receiving various injections for pain management. A sacroiliac injection on the right side in July provided only one day of relief. In November of the previous year, she received L5 epidural injections, which improved her leg pain for a period of time. However, none of the injections have provided long-term relief. She has a known allergy  to contrast media but has tolerated injections with premedication using diphenhydramine .  Her medication regimen includes Tylenol  #4 with codeine , which she takes up to three times a day for pain management. She has previously tried gabapentin  and pregabalin for sciatic pain but could not tolerate them due to side effects such as swelling and anxiety.  An MRI from a year ago showed severe arthritis at L4-5 with nerve narrowing, and moderate arthritis at L5-S1 with similar narrowing. Despite these findings, she reports that the pain feels equal on both sides.    Pain Inventory Average Pain 9 Pain Right Now 9 My pain is constant, sharp, stabbing, tingling, and aching  In the last 24 hours, has pain interfered with the following? General activity 2 Relation with others 10 Enjoyment of life 5 What TIME of day is  your pain at its worst? morning , daytime, evening, and night Sleep (in general) Fair  Pain is worse with: walking, bending, sitting, standing, and some activites Pain improves with: heat/ice Relief from Meds: 9  Family History  Problem Relation Age of Onset   Diabetes Mother    Dementia Mother    Alzheimer's disease Mother    Colon cancer Father        dx in his 64s   Prostate cancer Father    Colon cancer Sister        dx in her 58s   Breast cancer Maternal Aunt        50's   Esophageal cancer Neg Hx    Stomach cancer Neg Hx    Crohn's disease Neg Hx    Rectal cancer Neg Hx    Social History   Socioeconomic History   Marital status: Legally Separated    Spouse name: Les   Number of children: 3   Years of education: Not on file   Highest education level: Not on file  Occupational History   Occupation: DISABLED/assistant GC  school bus driver  Tobacco Use   Smoking status: Every Day    Current packs/day: 0.50    Average packs/day: 0.5 packs/day for 20.0 years (10.0 ttl pk-yrs)    Types: Cigarettes    Passive exposure: Never   Smokeless tobacco: Never  Vaping Use   Vaping status: Never Used  Substance and Sexual Activity   Alcohol use: No   Drug use: No  Sexual activity: Not Currently    Birth control/protection: Post-menopausal, Surgical    Comment: 1st intercourse 63 yo-Fewer than 5 partners-BTL  Other Topics Concern   Not on file  Social History Narrative   Lives with her sister-2025   Social Drivers of Health   Financial Resource Strain: Low Risk  (03/21/2023)   Overall Financial Resource Strain (CARDIA)    Difficulty of Paying Living Expenses: Not hard at all  Food Insecurity: No Food Insecurity (03/21/2023)   Hunger Vital Sign    Worried About Running Out of Food in the Last Year: Never true    Ran Out of Food in the Last Year: Never true  Transportation Needs: No Transportation Needs (03/21/2023)   PRAPARE - Administrator, Civil Service  (Medical): No    Lack of Transportation (Non-Medical): No  Physical Activity: Inactive (03/21/2023)   Exercise Vital Sign    Days of Exercise per Week: 0 days    Minutes of Exercise per Session: 0 min  Stress: No Stress Concern Present (03/21/2023)   [employer] of Occupational Health - Occupational Stress Questionnaire    Feeling of Stress : Not at all  Social Connections: Moderately Isolated (03/21/2023)   Social Connection and Isolation Panel    Frequency of Communication with Friends and Family: More than three times a week    Frequency of Social Gatherings with Friends and Family: Once a week    Attends Religious Services: More than 4 times per year    Active Member of [employer] or Organizations: No    Attends Banker Meetings: Never    Marital Status: Separated   Past Surgical History:  Procedure Laterality Date   ANTERIOR CERVICAL DECOMP/DISCECTOMY FUSION N/A 04/07/2016   Procedure: Cervical two-three Anterior cervical decompression/discectomy/fusion;  Surgeon: Rockey Peru, MD;  Location: [facility] OR;  Service: Neurosurgery;  Laterality: N/A;   BACK SURGERY  2015   lower back   CYSTOSCOPY/URETEROSCOPY/HOLMIUM LASER/STENT PLACEMENT Right 11/21/2022   Procedure: CYSTOSCOPY RIGHT RETROGRADE PYELOGRAM RIGHT URETEROSCOPY/HOLMIUM LASER/STENT PLACEMENT;  Surgeon: Nieves Cough, MD;  Location: [facility];  Service: Urology;  Laterality: Right;  60 MINS FOR CASE   EXTRACORPOREAL SHOCK WAVE LITHOTRIPSY Left 09/28/2016   Procedure: LEFT EXTRACORPOREAL SHOCK WAVE LITHOTRIPSY (ESWL);  Surgeon: Nieves Cough, MD;  Location: [facility];  Service: Urology;  Laterality: Left;   EXTRACORPOREAL SHOCK WAVE LITHOTRIPSY Right 04/08/2018   Procedure: EXTRACORPOREAL SHOCK WAVE LITHOTRIPSY (ESWL);  Surgeon: Cam Morene ORN, MD;  Location: [facility];  Service: Urology;  Laterality: Right;   KIDNEY SURGERY     LITHOTRIPSY  05/2016   TUBAL LIGATION     Past Surgical History:  Procedure Laterality  Date   ANTERIOR CERVICAL DECOMP/DISCECTOMY FUSION N/A 04/07/2016   Procedure: Cervical two-three Anterior cervical decompression/discectomy/fusion;  Surgeon: Rockey Peru, MD;  Location: [facility] OR;  Service: Neurosurgery;  Laterality: N/A;   BACK SURGERY  2015   lower back   CYSTOSCOPY/URETEROSCOPY/HOLMIUM LASER/STENT PLACEMENT Right 11/21/2022   Procedure: CYSTOSCOPY RIGHT RETROGRADE PYELOGRAM RIGHT URETEROSCOPY/HOLMIUM LASER/STENT PLACEMENT;  Surgeon: Nieves Cough, MD;  [facility]: [facility];  Service: Urology;  Laterality: Right;  60 MINS FOR CASE   EXTRACORPOREAL SHOCK WAVE LITHOTRIPSY Left 09/28/2016   Procedure: LEFT EXTRACORPOREAL SHOCK WAVE LITHOTRIPSY (ESWL);  Surgeon: Nieves Cough, MD;  Location: [facility];  Service: Urology;  Laterality: Left;   EXTRACORPOREAL SHOCK WAVE LITHOTRIPSY Right 04/08/2018   Procedure: EXTRACORPOREAL SHOCK WAVE LITHOTRIPSY (ESWL);  Surgeon: Cam Morene ORN, MD;  Location: [facility];  Service:  Urology;  Laterality: Right;   KIDNEY SURGERY     LITHOTRIPSY  05/2016   TUBAL LIGATION     Past Medical History:  Diagnosis Date   ACHILLES TENDINITIS    ALLERGIC RHINITIS    Allergy     Anginal pain 04/04/2016   Pt currently feels like she is having muscle spasms and aching in her chest   Anxiety    Asthma    Chronic kidney disease    Depression    Eczema    GERD    History of kidney stones    Hyperlipidemia    HYPERTENSION    Kidney stone    LATERAL EPICONDYLITIS, RIGHT    LOW BACK PAIN    Plantar fascial fibromatosis    Sciatica    Stress    Stroke (HCC)    SUBACROMIAL BURSITIS, RIGHT    Thoracic scoliosis 10/01/2015   TIA (transient ischemic attack)    BP 125/84   Pulse 66   Ht 5' (1.524 m)   Wt 154 lb (69.9 kg)   LMP 08/22/2010   SpO2 97%   BMI 30.08 kg/m   Opioid Risk Score:   Fall Risk Score:  `1  Depression screen [facility] 2/9     12/07/2023   11:22 AM 09/06/2023    1:19 PM 08/03/2023    2:01 PM 05/04/2023    9:51 AM 03/21/2023   11:47 AM  03/06/2023   10:56 AM 01/05/2023   10:13 AM  Depression screen PHQ 2/9  Decreased Interest 1 0 0 0 0 0 0  Down, Depressed, Hopeless 1 0 0 0 0 0 0  PHQ - 2 Score 2 0 0 0 0 0 0  Altered sleeping     1    Tired, decreased energy     2    Change in appetite     0    Feeling bad or failure about yourself      0    Trouble concentrating     0    Moving slowly or fidgety/restless     0    Suicidal thoughts     0    PHQ-9 Score     3    Difficult doing work/chores     Not difficult at all      Review of Systems  Musculoskeletal:  Positive for back pain.       Pain in both legs from buttocks to calves  All other systems reviewed and are negative.      Objective:   Physical Exam Vitals and nursing note reviewed.  Constitutional:      Appearance: She is obese.  HENT:     Head: Normocephalic and atraumatic.  Eyes:     General: No visual field deficit.    Extraocular Movements: Extraocular movements intact.     Conjunctiva/sclera: Conjunctivae normal.     Pupils: Pupils are equal, round, and reactive to light.  Musculoskeletal:     Right lower leg: No edema.     Left lower leg: No edema.  Skin:    General: Skin is warm and dry.     Findings: No rash.  Neurological:     Mental Status: She is alert and oriented to person, place, and time.     Cranial Nerves: No dysarthria or facial asymmetry.     Sensory: No sensory deficit.     Motor: No weakness, tremor or abnormal muscle tone.     Coordination: Coordination is intact.  Gait: Gait is intact.  Psychiatric:        Mood and Affect: Mood normal.        Behavior: Behavior normal.    Tenderness in the lumbar paraspinal area L4-S1 No pain with thigh thrust or FABERs except in the lumbar spine area no pain in the SI area or hip area Physical Exam MUSCULOSKELETAL: Spine with normal range of motion. Left leg with normal strength. Straight leg raise test normal.        Assessment & Plan:  Assessment and Plan Assessment &  Plan Chronic low back pain with bilateral sciatica and lumbar spondylosis with radiculopathy Pain radiates to calves without numbness or tingling. MRI shows severe L4-5 and moderate L5-S1 arthritis with nerve narrowing. Previous injections provided temporary relief. Symptoms suggest pinched nerves and arthritis. Epidural injections may offer 2-3 months relief. Surgical referral considered if injections insufficient. - Administer L5-S1 epidural injections for leg pain relief. - Schedule lumbar medial branch blocks for back pain relief. - Consider radiofrequency neurotomy if medial branch blocks effective. - Discuss surgical referral for decompression surgery.  Chronic pain syndrome Inadequate relief from current pain management. Gabapentin  and pregabalin not tolerated. Current regimen includes Tylenol  #4 with codeine . Recent urine drug screen reviewed. - Prescribe Tylenol  #4 with codeine , up to three times daily as needed. - Review recent urine drug screen.  Repeat oral swab today

## 2023-12-07 NOTE — Progress Notes (Signed)
   Follow Up Note  RE: TASHA DIAZ MRN: [id number] DOB: [birth year] Date of Office Visit: 12/07/2023  Referring provider: Norleen Lynwood ORN, MD Primary care provider: Norleen Lynwood ORN, MD  History of Present Illness: I had the pleasure of seeing Annise Boran for a follow up visit at the [facility] on 12/07/2023. She is a 63 y.o. female, who is being followed for contact dermatitis. Today she is here for second patch test interpretation, given suspected history of contact dermatitis.   Diagnostics:     Metals Patch     Time Antigen Placed      Location     Number of Test     Reading Interval  Day 5    Chromium chloride 1%  0     Cobalt chloride hexahydrate 1%  0     Molybdenum chloride 0.5%  0     Nickel sulfate hexahydrate 5%  0     Potassium dichromate 0.25%  0     Copper sulfate pentahydrate 2%  0     Titanium 0.1%  0     Manganese chloride 0.5%  0     Tantal 1%  0    Vanadium pentoxide 10%  0   Aluminum hydroxide 10%  0           Comments  all negative      Assessment and Plan: Alize is a 63 y.o. female with: Concern for Contact Dermatitis:  The patient has been provided detailed information regarding the substances she is sensitive to, as well as products containing the substances.  Meticulous avoidance of these substances is recommended. If avoidance is not possible, the use of barrier creams or lotions is recommended. If symptoms persist or progress despite meticulous avoidance of chemicals/substances above, dermatology evaluation may be warranted. No follow-ups on file.  It was my pleasure to see Sharri today and participate in her care. Please feel free to contact me with any questions or concerns.  Sincerely,   Arleta Blanch, MD [facility]

## 2023-12-10 ENCOUNTER — Encounter: Payer: Self-pay | Admitting: Family Medicine

## 2023-12-10 ENCOUNTER — Ambulatory Visit: Admitting: Family Medicine

## 2023-12-10 DIAGNOSIS — L230 Allergic contact dermatitis due to metals: Secondary | ICD-10-CM | POA: Insufficient documentation

## 2023-12-10 NOTE — Patient Instructions (Addendum)
 Diagnostics:    Metals Patch     Time Antigen Placed  12/03/2023    Location  Back    Number of Test  11    Reading Interval  Day 7    Chromium chloride 1%  0     Cobalt chloride hexahydrate 1%  0     Molybdenum chloride 0.5%  0     Nickel sulfate hexahydrate 5%  0     Potassium dichromate 0.25%  0     Copper sulfate pentahydrate 2%  0     Titanium 0.1%  0     Manganese chloride 0.5%  0     Tantal 1%  0    Vanadium pentoxide 10%  0   Aluminum hydroxide 10%  0    Comments  N/A     Allergic contact dermatitis - Metals patch testing negative on day 7 - If symptoms persist or progress, a dermatology referral may be warranted. - If your symptoms re-occur, begin a journal of events that occurred for up to 6 hours before your symptoms began including foods and beverages consumed, soaps or perfumes you had contact with, and medications.    Call the clinic if this treatment plan is not working well for you  Follow up in 3 months or sooner if needed.

## 2023-12-10 NOTE — Progress Notes (Signed)
    Follow-up Note  RE: [name] Walker MRN: [id number] DOB: Nov 30, 1960 Date of Office Visit: 12/10/2023  Primary care provider: Norleen Lynwood ORN, MD Referring provider: Norleen Lynwood ORN, MD   Germani returns to the office today for the final metals patch test interpretation, given suspected history of contact dermatitis. Metals patch testing results discussed. Patient aware that further testing will be available soon and she may want to get the extended metals patch when available.    Diagnostics:    Metals Patch     Time Antigen Placed  12/03/2023    Location  Back    Number of Test  11    Reading Interval  Day 7    Chromium chloride 1%  0     Cobalt chloride hexahydrate 1%  0     Molybdenum chloride 0.5%  0     Nickel sulfate hexahydrate 5%  0     Potassium dichromate 0.25%  0     Copper sulfate pentahydrate 2%  0     Titanium 0.1%  0     Manganese chloride 0.5%  0     Tantal 1%  0    Vanadium pentoxide 10%  0   Aluminum hydroxide 10%  0    Comments  N/A     Allergic contact dermatitis - Metals patch testing negative on day 7 - If symptoms persist or progress, a dermatology referral may be warranted. - If your symptoms re-occur, begin a journal of events that occurred for up to 6 hours before your symptoms began including foods and beverages consumed, soaps or perfumes you had contact with, and medications.    Call the clinic if this treatment plan is not working well for you  Follow up in 3 months or sooner if needed.    Thank you for the opportunity to care for this patient.  Please do not hesitate to contact me with questions.  Arlean Mutter, FNP [facility]  [facility]

## 2023-12-11 ENCOUNTER — Ambulatory Visit: Payer: Self-pay

## 2023-12-11 ENCOUNTER — Other Ambulatory Visit: Payer: Self-pay

## 2023-12-11 ENCOUNTER — Emergency Department (HOSPITAL_COMMUNITY)
Admission: EM | Admit: 2023-12-11 | Discharge: 2023-12-11 | Disposition: A | Discharge status: Home / Self Care | Attending: Emergency Medicine | Admitting: Emergency Medicine

## 2023-12-11 ENCOUNTER — Emergency Department (HOSPITAL_COMMUNITY)

## 2023-12-11 ENCOUNTER — Encounter (HOSPITAL_COMMUNITY): Payer: Self-pay

## 2023-12-11 DIAGNOSIS — N200 Calculus of kidney: Secondary | ICD-10-CM | POA: Diagnosis not present

## 2023-12-11 DIAGNOSIS — N132 Hydronephrosis with renal and ureteral calculous obstruction: Secondary | ICD-10-CM | POA: Diagnosis not present

## 2023-12-11 DIAGNOSIS — K5730 Diverticulosis of large intestine without perforation or abscess without bleeding: Secondary | ICD-10-CM | POA: Diagnosis not present

## 2023-12-11 DIAGNOSIS — Z7982 Long term (current) use of aspirin: Secondary | ICD-10-CM | POA: Diagnosis not present

## 2023-12-11 DIAGNOSIS — R109 Unspecified abdominal pain: Secondary | ICD-10-CM | POA: Diagnosis not present

## 2023-12-11 DIAGNOSIS — R10A2 Flank pain, left side: Secondary | ICD-10-CM | POA: Diagnosis present

## 2023-12-11 LAB — CBC WITH DIFFERENTIAL/PLATELET
Abs Immature Granulocytes: 0.03 K/uL (ref 0.00–0.07)
Basophils Absolute: 0 K/uL (ref 0.0–0.1)
Basophils Relative: 0 %
Eosinophils Absolute: 0 K/uL (ref 0.0–0.5)
Eosinophils Relative: 0 %
HCT: 45.5 % (ref 36.0–46.0)
Hemoglobin: 14.5 g/dL (ref 12.0–15.0)
Immature Granulocytes: 0 %
Lymphocytes Relative: 25 %
Lymphs Abs: 2.7 K/uL (ref 0.7–4.0)
MCH: 28 pg (ref 26.0–34.0)
MCHC: 31.9 g/dL (ref 30.0–36.0)
MCV: 87.8 fL (ref 80.0–100.0)
Monocytes Absolute: 1.2 K/uL — ABNORMAL HIGH (ref 0.1–1.0)
Monocytes Relative: 11 %
Neutro Abs: 6.7 K/uL (ref 1.7–7.7)
Neutrophils Relative %: 64 %
Platelets: 120 K/uL — ABNORMAL LOW (ref 150–400)
RBC: 5.18 MIL/uL — ABNORMAL HIGH (ref 3.87–5.11)
RDW: 14.6 % (ref 11.5–15.5)
WBC: 10.6 K/uL — ABNORMAL HIGH (ref 4.0–10.5)
nRBC: 0 % (ref 0.0–0.2)

## 2023-12-11 LAB — COMPREHENSIVE METABOLIC PANEL WITH GFR
ALT: 21 U/L (ref 0–44)
AST: 29 U/L (ref 15–41)
Albumin: 4.3 g/dL (ref 3.5–5.0)
Alkaline Phosphatase: 87 U/L (ref 38–126)
Anion gap: 15 (ref 5–15)
BUN: 17 mg/dL (ref 8–23)
CO2: 21 mmol/L — ABNORMAL LOW (ref 22–32)
Calcium: 10.3 mg/dL (ref 8.9–10.3)
Chloride: 108 mmol/L (ref 98–111)
Creatinine, Ser: 1.31 mg/dL — ABNORMAL HIGH (ref 0.44–1.00)
GFR, Estimated: 46 mL/min — ABNORMAL LOW (ref >60)
Glucose, Bld: 97 mg/dL (ref 70–99)
Potassium: 4.1 mmol/L (ref 3.5–5.1)
Sodium: 143 mmol/L (ref 135–145)
Total Bilirubin: 0.8 mg/dL (ref 0.0–1.2)
Total Protein: 7.2 g/dL (ref 6.5–8.1)

## 2023-12-11 LAB — URINALYSIS, ROUTINE W REFLEX MICROSCOPIC
Bilirubin Urine: NEGATIVE
Glucose, UA: NEGATIVE mg/dL
Ketones, ur: NEGATIVE mg/dL
Leukocytes,Ua: NEGATIVE
Nitrite: NEGATIVE
Protein, ur: NEGATIVE mg/dL
RBC / HPF: >50 RBC/hpf (ref 0–5)
Specific Gravity, Urine: 1.012 (ref 1.005–1.030)
pH: 5 (ref 5.0–8.0)

## 2023-12-11 LAB — LIPASE, BLOOD: Lipase: 21 U/L (ref 11–51)

## 2023-12-11 MED ORDER — ONDANSETRON HCL 4 MG PO TABS: 4.0000 mg | ORAL_TABLET | Freq: Four times a day (QID) | ORAL | 0 refills | Status: DC

## 2023-12-11 MED ORDER — ONDANSETRON HCL 4 MG/2ML IJ SOLN
4.0000 mg | Freq: Once | INTRAMUSCULAR | Status: DC
  Filled 2023-12-11: qty 2

## 2023-12-11 MED ORDER — KETOROLAC TROMETHAMINE 30 MG/ML IJ SOLN
15.0000 mg | Freq: Once | INTRAMUSCULAR | Status: AC
  Administered 2023-12-11: 15 mg via INTRAVENOUS
  Filled 2023-12-11: qty 1

## 2023-12-11 MED ORDER — HYDROCODONE-ACETAMINOPHEN 5-325 MG PO TABS
1.0000 | ORAL_TABLET | Freq: Once | ORAL | Status: AC
  Administered 2023-12-11: 1 via ORAL
  Filled 2023-12-11: qty 1

## 2023-12-11 NOTE — Discharge Instructions (Addendum)
 Evaluation today revealed that you likely did have a kidney stone which appears to have passed.  The CT scan.  Of note your kidney function appears to be slightly reduced.  Comparison to last check a year ago.  Please follow-up with your PCP for recheck of your labs.  If you develop worsening flank pain, fever, persistent nausea and vomiting or any other concerning symptom please return to the ED for further evaluation.  I sent a few tablets of Zofran  as well for nausea at home.

## 2023-12-11 NOTE — Telephone Encounter (Signed)
 FYI Only or Action Required?: FYI only for provider.  Patient was last seen in primary care on 11/01/2023 by Norleen Lynwood ORN, MD.  Called Nurse Triage reporting Hematuria.  Symptoms began yesterday.  Interventions attempted: Nothing.  Symptoms are: gradually worsening.  Triage Disposition: Go to ED Now (Notify PCP)  Patient/caregiver understands and will follow disposition?: Yes        Copied from CRM [id number]. Topic: Clinical - Red Word Triage >> Dec 11, 2023  3:04 PM Thersia BROCKS wrote: Red Word that prompted transfer to Nurse Triage: Patient called in stated her back is hurting, urinating blood Reason for Disposition  Passing pure blood or large blood clots (i.e., size > a dime)  (Exception: Fleck or small strands.)  Answer Assessment - Initial Assessment Questions Pt states symptoms stated yesterday, blood in urine, back pain, abdominal pain, throwing up.    1. COLOR of URINE: Describe the color of the urine.  (e.g., tea-colored, pink, red, bloody) Do you have blood clots in your urine? (e.g., none, pea, grape, small coin)     red 2. ONSET: When did the bleeding start?      yesterday 3. EPISODES: How many times has there been blood in the urine? or How many times today?     3-4  4. PAIN with URINATION: Is there any pain with passing your urine? If Yes, ask: How bad is the pain?  (Scale 1-10; or mild, moderate, severe)      A little  5. FEVER: Do you have a fever? If Yes, ask: What is your temperature, how was it measured, and when did it start?     No  6. ASSOCIATED SYMPTOMS: Are you passing urine more frequently than usual?     Lower abdominal, nausea and vomiting,  7. OTHER SYMPTOMS: Do you have any other symptoms? (e.g., back/flank pain, abdomen pain, vomiting)     Back pain  Protocols used: Urine - Blood In-A-AH

## 2023-12-11 NOTE — ED Triage Notes (Signed)
 Pt reports with left flank pain and hematuria since yesterday. Pt has a hx of kidney stones.

## 2023-12-11 NOTE — ED Provider Notes (Signed)
 [facility] EMERGENCY DEPARTMENT AT  Hospital Provider Note   CSN: [id number] Arrival date & time: 12/11/23  8371     Patient presents with: Flank Pain  HPI [name] Walker is a 63 y.o. female presenting for left flank pain and hematuria.  Started yesterday.  Pain at times radiates to the left lower abdomen.  She states it feels like when she has had a kidney stone previously.  Denies fever.  Endorses some nausea but no vomiting or diarrhea.    Flank Pain       Prior to Admission medications   Medication Sig Start Date End Date Taking? Authorizing Provider  acetaminophen -codeine  (TYLENOL  #4) 300-60 MG tablet Take 1 tablet by mouth every 4 (four) hours as needed for moderate pain (pain score 4-6). 12/07/23   Kirsteins, Prentice BRAVO, MD  albuterol  (VENTOLIN  HFA) 108 (90 Base) MCG/ACT inhaler INHALE 2 PUFFS BY MOUTH EVERY 6 HOURS AS NEEDED FOR WHEEZING FOR SHORTNESS OF BREATH 02/02/23   Alvia Corean CROME, FNP  alendronate  (FOSAMAX ) 70 MG tablet Take 1 tablet (70 mg total) by mouth every 7 (seven) days. Take with a full glass of water on an empty stomach. 12/05/22   Prentiss Annabella LABOR, NP  amLODipine  (NORVASC ) 5 MG tablet Take 1 tablet (5 mg total) by mouth daily. 11/01/23   Norleen Lynwood ORN, MD  aspirin  EC 81 MG tablet Take 1 tablet (81 mg total) by mouth daily. Swallow whole. 09/15/19   Norleen Lynwood ORN, MD  atorvastatin  (LIPITOR) 20 MG tablet Take 1 tablet (20 mg total) by mouth daily. 07/28/22 12/07/23  Norleen Lynwood ORN, MD  Cholecalciferol (THERA-D 2000) 50 MCG (2000 UT) TABS 1 tab by mouth once daily 06/30/20   Norleen Lynwood ORN, MD  citalopram  (CELEXA ) 40 MG tablet Take 1 tablet (40 mg total) by mouth daily. 11/01/23   Norleen Lynwood ORN, MD  fluticasone  (FLONASE ) 50 MCG/ACT nasal spray Place 1 spray into both nostrils in the morning and at bedtime. 10/02/23   Iva Marty Saltness, MD  fluticasone  (FLONASE ) 50 MCG/ACT nasal spray Place 2 sprays into both nostrils daily. 10/30/23   Couture, Cortni  S, PA-C  fluticasone -salmeterol (ADVAIR DISKUS) 250-50 MCG/ACT AEPB Inhale 1 puff into the lungs daily. Can increase to twice daily during respiratory flares. 10/02/23   Iva Marty Saltness, MD  levocetirizine (XYZAL ) 5 MG tablet Take 1 tablet (5 mg total) by mouth every evening. 10/02/23   Iva Marty Saltness, MD  lidocaine  (LIDODERM ) 5 % Place 1 patch onto the skin daily. Remove & Discard patch within 12 hours or as directed by MD 05/02/22   Norleen Lynwood ORN, MD  LORazepam  (ATIVAN ) 1 MG tablet Take 1 tablet (1 mg total) by mouth 2 (two) times daily as needed for anxiety. 08/29/23   Norleen Lynwood ORN, MD  losartan  (COZAAR ) 50 MG tablet Take 1 tablet by mouth once daily 11/21/22   Norleen Lynwood ORN, MD  pantoprazole  (PROTONIX ) 40 MG tablet TAKE 1 TABLET BY MOUTH TWICE DAILY BEFORE A MEAL 06/12/23   Norleen Lynwood ORN, MD  traZODone  (DESYREL ) 100 MG tablet TAKE 1 TABLET BY MOUTH AT BEDTIME 11/12/23   Norleen Lynwood ORN, MD    Allergies: Flexeril  [cyclobenzaprine ], Lisinopril , Other, Robaxin  [methocarbamol ], Contrast media [iodinated contrast media], Iodine, Gabapentin , and Pregabalin    Review of Systems  Genitourinary:  Positive for flank pain.    Updated Vital Signs BP 130/88 (BP Location: Left Arm)   Pulse 85   Temp 99.1 F (37.3  C) (Oral)   Resp 18   LMP 08/22/2010   SpO2 96%   Physical Exam Vitals and nursing note reviewed.  HENT:     Head: Normocephalic and atraumatic.     Mouth/Throat:     Mouth: Mucous membranes are moist.  Eyes:     General:        Right eye: No discharge.        Left eye: No discharge.     Conjunctiva/sclera: Conjunctivae normal.  Cardiovascular:     Rate and Rhythm: Normal rate and regular rhythm.     Pulses: Normal pulses.     Heart sounds: Normal heart sounds.  Pulmonary:     Effort: Pulmonary effort is normal.     Breath sounds: Normal breath sounds.  Abdominal:     General: Abdomen is flat. There is no distension.     Palpations: Abdomen is soft.     Tenderness: There  is no abdominal tenderness.  Skin:    General: Skin is warm and dry.  Neurological:     General: No focal deficit present.  Psychiatric:        Mood and Affect: Mood normal.     (all labs ordered are listed, but only abnormal results are displayed) Labs Reviewed  COMPREHENSIVE METABOLIC PANEL WITH GFR - Abnormal; Notable for the following components:      Result Value   CO2 21 (*)    Creatinine, Ser 1.31 (*)    GFR, Estimated 46 (*)    All other components within normal limits  CBC WITH DIFFERENTIAL/PLATELET - Abnormal; Notable for the following components:   WBC 10.6 (*)    RBC 5.18 (*)    Platelets 120 (*)    Monocytes Absolute 1.2 (*)    All other components within normal limits  URINALYSIS, ROUTINE W REFLEX MICROSCOPIC - Abnormal; Notable for the following components:   Hgb urine dipstick MODERATE (*)    Bacteria, UA RARE (*)    All other components within normal limits  LIPASE, BLOOD    EKG: None  Radiology: CT Renal Stone Study Result Date: 12/11/2023 EXAM: CT UROGRAM 12/11/2023 05:39:39 PM TECHNIQUE: CT of the abdomen and pelvis was performed without the administration of intravenous contrast as per CT urogram protocol. Multiplanar reformatted images as well as MIP urogram images are provided for review. Automated exposure control, iterative reconstruction, and/or weight based adjustment of the mA/kV was utilized to reduce the radiation dose to as low as reasonably achievable. COMPARISON: CT Urogram dated 09/11/20024. CLINICAL HISTORY: Abdominal/flank pain, stone suspected. Pt reports with left flank pain and hematuria since yesterday. Pt has a hx of kidney stones. FINDINGS: LOWER CHEST: No acute abnormality. LIVER: The liver is unremarkable. GALLBLADDER AND BILE DUCTS: Gallbladder is unremarkable. No biliary ductal dilatation. SPLEEN: No acute abnormality. PANCREAS: No acute abnormality. ADRENAL GLANDS: No acute abnormality. KIDNEYS, URETERS AND BLADDER: Small  nonobstructive right renal calculus. Mild left hydroureteronephrosis is noted with perinephric stranding without definite evidence of obstructing calculus. Urinary bladder is decompressed. There does appear to be a small calculus in dependent portion of urinary bladder suggesting recently passed stone. GI AND BOWEL: Stomach demonstrates no acute abnormality. There is no bowel obstruction. Diverticulosis of descending and sigmoid colon is noted without inflammation. PERITONEUM AND RETROPERITONEUM: No ascites. No free air. VASCULATURE: Aorta is normal in caliber. LYMPH NODES: No lymphadenopathy. REPRODUCTIVE ORGANS: No acute abnormality. BONES AND SOFT TISSUES: No acute osseous abnormality. No focal soft tissue abnormality. IMPRESSION: 1. Mild left  hydroureteronephrosis with perinephric stranding, without definite evidence of an obstructing calculus. 2. Small nonobstructive right renal calculus. 3. Small calculus in the dependent portion of the urinary bladder, suggesting a recently passed stone. 4. Colonic diverticulosis without evidence of diverticulitis. Electronically signed by: Lynwood Seip MD 12/11/2023 05:53 PM EDT RP Workstation: [id number]     Procedures   Medications Ordered in the ED  ondansetron  (ZOFRAN ) injection 4 mg (has no administration in time range)  ketorolac  (TORADOL ) 30 MG/ML injection 15 mg (has no administration in time range)  HYDROcodone -acetaminophen  (NORCO/VICODIN) 5-325 MG per tablet 1 tablet (has no administration in time range)    Clinical Course as of 12/11/23 2006  Tue Dec 11, 2023  2003 CT Renal Bethena Rear [JR]    Clinical Course User Index [JR] [name] Norleen POUR, PA-C                                 Medical Decision Making Amount and/or Complexity of Data Reviewed Labs: ordered. Radiology: ordered.   63 year old well-appearing female presenting for left flank pain and hematuria.  Exam was unremarkable.  CT renal shows mild left hydro ureter nephrosis and  evidence of what is likely a passed kidney stone.  She states overall her pain has improved.  UA not concerning for infection.  Creatinine noted to be 1.31 here and 0.62 a year ago.  Renal function slightly reduced.  Unsure if this is related to the kidney stone or chronic or both.  Nevertheless she looks well symptoms have improved and she has expressed desire to be discharged.  Feel this is appropriate given that the kidney stone appears to have passed.  Advised her to follow-up with her PCP for recheck of her labs and her symptoms.  Discussed return precautions.  Advised Tylenol  and ibuprofen  at home for pain.  Discharged good condition.     Final diagnoses:  Kidney stone    ED Discharge Orders     None          [name] Walker [name] Walker 12/11/23 2007    [name] Hollering, MD 12/11/23 [phone]

## 2023-12-12 LAB — DRUG TOX MONITOR 1 W/CONF, ORAL FLD
AMINOCLONAZEPAM: NEGATIVE ng/mL (ref <0.50)
Alprazolam: NEGATIVE ng/mL (ref <0.50)
Amphetamines: NEGATIVE ng/mL (ref <10)
Barbiturates: NEGATIVE ng/mL (ref <10)
Benzodiazepines: POSITIVE ng/mL — AB (ref <0.50)
Buprenorphine: NEGATIVE ng/mL (ref <0.10)
Chlordiazepoxide: NEGATIVE ng/mL (ref <0.50)
Clonazepam: NEGATIVE ng/mL (ref <0.50)
Cocaine: NEGATIVE ng/mL (ref <5.0)
Codeine: 59.4 ng/mL — ABNORMAL HIGH (ref <2.5)
Cotinine: >250 ng/mL — ABNORMAL HIGH (ref <5.0)
Diazepam: NEGATIVE ng/mL (ref <0.50)
Dihydrocodeine: NEGATIVE ng/mL (ref <2.5)
Fentanyl: NEGATIVE ng/mL (ref <0.10)
Flunitrazepam: NEGATIVE ng/mL (ref <0.50)
Flurazepam: NEGATIVE ng/mL (ref <0.50)
Heroin Metabolite: NEGATIVE ng/mL (ref <1.0)
Hydrocodone: 5.8 ng/mL — ABNORMAL HIGH (ref <2.5)
Hydromorphone: NEGATIVE ng/mL (ref <2.5)
Lorazepam: 4.19 ng/mL — ABNORMAL HIGH (ref <0.50)
MARIJUANA: NEGATIVE ng/mL (ref <2.5)
MDMA: NEGATIVE ng/mL (ref <10)
Meprobamate: NEGATIVE ng/mL (ref <2.5)
Methadone: NEGATIVE ng/mL (ref <5.0)
Midazolam: NEGATIVE ng/mL (ref <0.50)
Morphine: NEGATIVE ng/mL (ref <2.5)
Nicotine Metabolite: POSITIVE ng/mL — AB (ref <5.0)
Nordiazepam: NEGATIVE ng/mL (ref <0.50)
Norhydrocodone: NEGATIVE ng/mL (ref <2.5)
Noroxycodone: NEGATIVE ng/mL (ref <2.5)
Opiates: POSITIVE ng/mL — AB (ref <2.5)
Oxazepam: NEGATIVE ng/mL (ref <0.50)
Oxycodone: NEGATIVE ng/mL (ref <2.5)
Oxymorphone: NEGATIVE ng/mL (ref <2.5)
Phencyclidine: NEGATIVE ng/mL (ref <10)
Tapentadol: NEGATIVE ng/mL (ref <5.0)
Temazepam: NEGATIVE ng/mL (ref <0.50)
Tramadol: NEGATIVE ng/mL (ref <5.0)
Triazolam: NEGATIVE ng/mL (ref <0.50)
Zolpidem: NEGATIVE ng/mL (ref <5.0)

## 2023-12-12 LAB — DRUG TOX ALC METAB W/CON, ORAL FLD: Alcohol Metabolite: NEGATIVE ng/mL (ref <25)

## 2023-12-12 NOTE — Telephone Encounter (Signed)
 FYI

## 2023-12-14 ENCOUNTER — Other Ambulatory Visit: Payer: Self-pay | Admitting: Internal Medicine

## 2023-12-14 NOTE — Telephone Encounter (Signed)
 Copied from CRM [id number]. Topic: Clinical - Medication Refill >> Dec 14, 2023 11:50 AM Laymon HERO wrote: Medication: LORazepam  (ATIVAN ) 1 MG tablet  Has the patient contacted their pharmacy? Yes (Agent: If no, request that the patient contact the pharmacy for the refill. If patient does not wish to contact the pharmacy document the reason why and proceed with request.) (Agent: If yes, when and what did the pharmacy advise?)  This is the patient's preferred pharmacy:  [facility] [address], [place] - [address] [place] [address] Phone: [phone] Fax: [phone]    Is this the correct pharmacy for this prescription? Yes If no, delete pharmacy and type the correct one.   Has the prescription been filled recently? Yes  Is the patient out of the medication? Yes  Has the patient been seen for an appointment in the last year OR does the patient have an upcoming appointment? Yes  Can we respond through MyChart? Yes  Agent: Please be advised that Rx refills may take up to 3 business days. We ask that you follow-up with your pharmacy.

## 2023-12-16 ENCOUNTER — Inpatient Hospital Stay (HOSPITAL_COMMUNITY)
Admission: EM | Admit: 2023-12-16 | Discharge: 2023-12-28 | DRG: 335 | Disposition: A | Discharge status: Home / Self Care | Attending: Internal Medicine | Admitting: Internal Medicine

## 2023-12-16 ENCOUNTER — Emergency Department (HOSPITAL_COMMUNITY)

## 2023-12-16 DIAGNOSIS — J9601 Acute respiratory failure with hypoxia: Secondary | ICD-10-CM | POA: Diagnosis present

## 2023-12-16 DIAGNOSIS — K219 Gastro-esophageal reflux disease without esophagitis: Secondary | ICD-10-CM | POA: Diagnosis present

## 2023-12-16 DIAGNOSIS — Z8679 Personal history of other diseases of the circulatory system: Secondary | ICD-10-CM

## 2023-12-16 DIAGNOSIS — I472 Ventricular tachycardia, unspecified: Secondary | ICD-10-CM | POA: Diagnosis not present

## 2023-12-16 DIAGNOSIS — K5652 Intestinal adhesions [bands] with complete obstruction: Principal | ICD-10-CM | POA: Diagnosis present

## 2023-12-16 DIAGNOSIS — Z79899 Other long term (current) drug therapy: Secondary | ICD-10-CM

## 2023-12-16 DIAGNOSIS — Z7951 Long term (current) use of inhaled steroids: Secondary | ICD-10-CM

## 2023-12-16 DIAGNOSIS — K56609 Unspecified intestinal obstruction, unspecified as to partial versus complete obstruction: Principal | ICD-10-CM | POA: Diagnosis present

## 2023-12-16 DIAGNOSIS — K567 Ileus, unspecified: Secondary | ICD-10-CM | POA: Diagnosis not present

## 2023-12-16 DIAGNOSIS — Z6830 Body mass index (BMI) 30.0-30.9, adult: Secondary | ICD-10-CM

## 2023-12-16 DIAGNOSIS — I158 Other secondary hypertension: Secondary | ICD-10-CM | POA: Diagnosis present

## 2023-12-16 DIAGNOSIS — R1084 Generalized abdominal pain: Secondary | ICD-10-CM | POA: Diagnosis present

## 2023-12-16 DIAGNOSIS — E785 Hyperlipidemia, unspecified: Secondary | ICD-10-CM | POA: Diagnosis present

## 2023-12-16 DIAGNOSIS — E860 Dehydration: Secondary | ICD-10-CM | POA: Diagnosis present

## 2023-12-16 DIAGNOSIS — Z8673 Personal history of transient ischemic attack (TIA), and cerebral infarction without residual deficits: Secondary | ICD-10-CM

## 2023-12-16 DIAGNOSIS — M419 Scoliosis, unspecified: Secondary | ICD-10-CM | POA: Diagnosis present

## 2023-12-16 DIAGNOSIS — Z7982 Long term (current) use of aspirin: Secondary | ICD-10-CM

## 2023-12-16 DIAGNOSIS — K3189 Other diseases of stomach and duodenum: Secondary | ICD-10-CM | POA: Diagnosis present

## 2023-12-16 DIAGNOSIS — E870 Hyperosmolality and hypernatremia: Secondary | ICD-10-CM | POA: Diagnosis not present

## 2023-12-16 DIAGNOSIS — Z833 Family history of diabetes mellitus: Secondary | ICD-10-CM

## 2023-12-16 DIAGNOSIS — J9811 Atelectasis: Secondary | ICD-10-CM | POA: Diagnosis not present

## 2023-12-16 DIAGNOSIS — J90 Pleural effusion, not elsewhere classified: Secondary | ICD-10-CM | POA: Diagnosis not present

## 2023-12-16 DIAGNOSIS — J4540 Moderate persistent asthma, uncomplicated: Secondary | ICD-10-CM | POA: Diagnosis present

## 2023-12-16 DIAGNOSIS — D696 Thrombocytopenia, unspecified: Secondary | ICD-10-CM | POA: Diagnosis not present

## 2023-12-16 DIAGNOSIS — Z888 Allergy status to other drugs, medicaments and biological substances status: Secondary | ICD-10-CM

## 2023-12-16 DIAGNOSIS — T380X5A Adverse effect of glucocorticoids and synthetic analogues, initial encounter: Secondary | ICD-10-CM | POA: Diagnosis present

## 2023-12-16 DIAGNOSIS — Z820 Family history of epilepsy and other diseases of the nervous system: Secondary | ICD-10-CM

## 2023-12-16 DIAGNOSIS — K9189 Other postprocedural complications and disorders of digestive system: Secondary | ICD-10-CM | POA: Diagnosis not present

## 2023-12-16 DIAGNOSIS — E876 Hypokalemia: Secondary | ICD-10-CM | POA: Diagnosis present

## 2023-12-16 DIAGNOSIS — Z91041 Radiographic dye allergy status: Secondary | ICD-10-CM

## 2023-12-16 DIAGNOSIS — R188 Other ascites: Secondary | ICD-10-CM | POA: Diagnosis present

## 2023-12-16 DIAGNOSIS — F411 Generalized anxiety disorder: Secondary | ICD-10-CM | POA: Diagnosis present

## 2023-12-16 DIAGNOSIS — R110 Nausea: Secondary | ICD-10-CM | POA: Diagnosis present

## 2023-12-16 DIAGNOSIS — Z981 Arthrodesis status: Secondary | ICD-10-CM

## 2023-12-16 DIAGNOSIS — E861 Hypovolemia: Secondary | ICD-10-CM | POA: Diagnosis present

## 2023-12-16 DIAGNOSIS — F17210 Nicotine dependence, cigarettes, uncomplicated: Secondary | ICD-10-CM | POA: Diagnosis present

## 2023-12-16 DIAGNOSIS — F32A Depression, unspecified: Secondary | ICD-10-CM | POA: Diagnosis present

## 2023-12-16 DIAGNOSIS — Z720 Tobacco use: Secondary | ICD-10-CM | POA: Diagnosis present

## 2023-12-16 DIAGNOSIS — E669 Obesity, unspecified: Secondary | ICD-10-CM | POA: Diagnosis present

## 2023-12-16 LAB — CBC WITH DIFFERENTIAL/PLATELET
Abs Immature Granulocytes: 0.02 K/uL (ref 0.00–0.07)
Basophils Absolute: 0 K/uL (ref 0.0–0.1)
Basophils Relative: 0 %
Eosinophils Absolute: 0 K/uL (ref 0.0–0.5)
Eosinophils Relative: 0 %
HCT: 49.9 % — ABNORMAL HIGH (ref 36.0–46.0)
Hemoglobin: 16.1 g/dL — ABNORMAL HIGH (ref 12.0–15.0)
Immature Granulocytes: 0 %
Lymphocytes Relative: 23 %
Lymphs Abs: 1.6 K/uL (ref 0.7–4.0)
MCH: 27.9 pg (ref 26.0–34.0)
MCHC: 32.3 g/dL (ref 30.0–36.0)
MCV: 86.5 fL (ref 80.0–100.0)
Monocytes Absolute: 0.5 K/uL (ref 0.1–1.0)
Monocytes Relative: 7 %
Neutro Abs: 4.8 K/uL (ref 1.7–7.7)
Neutrophils Relative %: 70 %
Platelets: 162 K/uL (ref 150–400)
RBC: 5.77 MIL/uL — ABNORMAL HIGH (ref 3.87–5.11)
RDW: 14 % (ref 11.5–15.5)
WBC: 6.9 K/uL (ref 4.0–10.5)
nRBC: 0 % (ref 0.0–0.2)

## 2023-12-16 LAB — BASIC METABOLIC PANEL WITH GFR
Anion gap: 18 — ABNORMAL HIGH (ref 5–15)
BUN: 22 mg/dL (ref 8–23)
CO2: 22 mmol/L (ref 22–32)
Calcium: 10.9 mg/dL — ABNORMAL HIGH (ref 8.9–10.3)
Chloride: 104 mmol/L (ref 98–111)
Creatinine, Ser: 1.09 mg/dL — ABNORMAL HIGH (ref 0.44–1.00)
GFR, Estimated: 57 mL/min — ABNORMAL LOW (ref >60)
Glucose, Bld: 171 mg/dL — ABNORMAL HIGH (ref 70–99)
Potassium: 3.6 mmol/L (ref 3.5–5.1)
Sodium: 144 mmol/L (ref 135–145)

## 2023-12-16 MED ORDER — SODIUM CHLORIDE 0.9 % IV BOLUS
1000.0000 mL | Freq: Once | INTRAVENOUS | Status: AC
  Administered 2023-12-16: 1000 mL via INTRAVENOUS

## 2023-12-16 MED ORDER — MORPHINE SULFATE (PF) 4 MG/ML IV SOLN
4.0000 mg | Freq: Once | INTRAVENOUS | Status: AC
  Administered 2023-12-16: 4 mg via INTRAVENOUS
  Filled 2023-12-16: qty 1

## 2023-12-16 MED ORDER — ONDANSETRON HCL 4 MG/2ML IJ SOLN
4.0000 mg | Freq: Once | INTRAMUSCULAR | Status: AC
  Administered 2023-12-16: 4 mg via INTRAVENOUS
  Filled 2023-12-16: qty 2

## 2023-12-16 NOTE — ED Triage Notes (Signed)
 Patient arrived with complaints of left sided flank pain, hematuria, nausea and vomiting over the last few days. Reports hx of kidney stones in the past.

## 2023-12-16 NOTE — ED Notes (Signed)
 Patient returned from CT at this time.

## 2023-12-16 NOTE — ED Provider Notes (Signed)
 [place] EMERGENCY DEPARTMENT AT [facility] Provider Note   CSN: [id number] Arrival date & time: 12/16/23  2147     Patient presents with: Flank Pain   [name] Walker is a 63 y.o. female.  Patient with past medical history significant for TIA, CVA, kidney stones, chronic kidney disease presents emergency room complaining of left-sided flank pain.  She states she was seen approximately 5 days ago for similar pain.  At that time a scan was done which appeared to show a possible passed stone she states she has been taking Zofran  and over-the-counter medications at home with no relief of nausea or pain.  She is currently vomiting frequently throughout the day.  She states she last kept fluids down yesterday.  Pain is rated at 9 out of 10 in severity.  She denies chest pain, fever, shortness of breath.  She continues to feel that this pain is similar to kidney stone pain.  {Add pertinent medical, surgical, social history, OB history to HPI:32947}  Flank Pain       Prior to Admission medications   Medication Sig Start Date End Date Taking? Authorizing Provider  acetaminophen -codeine  (TYLENOL  #4) 300-60 MG tablet Take 1 tablet by mouth every 4 (four) hours as needed for moderate pain (pain score 4-6). 12/07/23   Kirsteins, Prentice BRAVO, MD  albuterol  (VENTOLIN  HFA) 108 (90 Base) MCG/ACT inhaler INHALE 2 PUFFS BY MOUTH EVERY 6 HOURS AS NEEDED FOR WHEEZING FOR SHORTNESS OF BREATH 02/02/23   Alvia Corean CROME, FNP  alendronate  (FOSAMAX ) 70 MG tablet Take 1 tablet (70 mg total) by mouth every 7 (seven) days. Take with a full glass of water on an empty stomach. 12/05/22   Prentiss Annabella LABOR, NP  amLODipine  (NORVASC ) 5 MG tablet Take 1 tablet (5 mg total) by mouth daily. 11/01/23   Norleen Lynwood ORN, MD  aspirin  EC 81 MG tablet Take 1 tablet (81 mg total) by mouth daily. Swallow whole. 09/15/19   Norleen Lynwood ORN, MD  atorvastatin  (LIPITOR) 20 MG tablet Take 1 tablet (20 mg total) by mouth daily.  07/28/22 12/07/23  Norleen Lynwood ORN, MD  Cholecalciferol (THERA-D 2000) 50 MCG (2000 UT) TABS 1 tab by mouth once daily 06/30/20   Norleen Lynwood ORN, MD  citalopram  (CELEXA ) 40 MG tablet Take 1 tablet (40 mg total) by mouth daily. 11/01/23   Norleen Lynwood ORN, MD  fluticasone  (FLONASE ) 50 MCG/ACT nasal spray Place 1 spray into both nostrils in the morning and at bedtime. 10/02/23   Iva Marty Saltness, MD  fluticasone  (FLONASE ) 50 MCG/ACT nasal spray Place 2 sprays into both nostrils daily. 10/30/23   Couture, Cortni S, PA-C  fluticasone -salmeterol (ADVAIR DISKUS) 250-50 MCG/ACT AEPB Inhale 1 puff into the lungs daily. Can increase to twice daily during respiratory flares. 10/02/23   Iva Marty Saltness, MD  levocetirizine (XYZAL ) 5 MG tablet Take 1 tablet (5 mg total) by mouth every evening. 10/02/23   Iva Marty Saltness, MD  lidocaine  (LIDODERM ) 5 % Place 1 patch onto the skin daily. Remove & Discard patch within 12 hours or as directed by MD 05/02/22   Norleen Lynwood ORN, MD  LORazepam  (ATIVAN ) 1 MG tablet Take 1 tablet by mouth twice daily as needed for anxiety 12/14/23   Norleen Lynwood ORN, MD  losartan  (COZAAR ) 50 MG tablet Take 1 tablet by mouth once daily 11/21/22   Norleen Lynwood ORN, MD  ondansetron  (ZOFRAN ) 4 MG tablet Take 1 tablet (4 mg total) by mouth every 6 (  six) hours. 12/11/23   Robinson, John K, PA-C  pantoprazole  (PROTONIX ) 40 MG tablet TAKE 1 TABLET BY MOUTH TWICE DAILY BEFORE A MEAL 06/12/23   Norleen Lynwood ORN, MD  traZODone  (DESYREL ) 100 MG tablet TAKE 1 TABLET BY MOUTH AT BEDTIME 11/12/23   Norleen Lynwood ORN, MD    Allergies: Flexeril  [cyclobenzaprine ], Lisinopril , Other, Robaxin  [methocarbamol ], Contrast media [iodinated contrast media], Iodine, Gabapentin , and Pregabalin    Review of Systems  Genitourinary:  Positive for flank pain.    Updated Vital Signs BP (!) 148/107 (BP Location: Left Arm)   Pulse 100   Temp 97.9 F (36.6 C) (Oral)   Resp (!) 21   LMP 08/22/2010   SpO2 100%   Physical Exam Vitals  and nursing note reviewed.  Constitutional:      General: She is not in acute distress.    Appearance: She is well-developed.  HENT:     Head: Normocephalic and atraumatic.  Eyes:     Conjunctiva/sclera: Conjunctivae normal.  Cardiovascular:     Rate and Rhythm: Normal rate and regular rhythm.  Pulmonary:     Effort: Pulmonary effort is normal. No respiratory distress.     Breath sounds: Normal breath sounds.  Abdominal:     Palpations: Abdomen is soft.     Tenderness: There is no abdominal tenderness. There is left CVA tenderness.  Musculoskeletal:        General: No swelling.     Cervical back: Neck supple.  Skin:    General: Skin is warm and dry.     Capillary Refill: Capillary refill takes less than 2 seconds.  Neurological:     Mental Status: She is alert.  Psychiatric:        Mood and Affect: Mood normal.     (all labs ordered are listed, but only abnormal results are displayed) Labs Reviewed  CBC WITH DIFFERENTIAL/PLATELET - Abnormal; Notable for the following components:      Result Value   RBC 5.77 (*)    Hemoglobin 16.1 (*)    HCT 49.9 (*)    All other components within normal limits  BASIC METABOLIC PANEL WITH GFR  URINALYSIS, ROUTINE W REFLEX MICROSCOPIC    EKG: None  Radiology: No results found.  {Document cardiac monitor, telemetry assessment procedure when appropriate:32947} Procedures   Medications Ordered in the ED  morphine  (PF) 4 MG/ML injection 4 mg (4 mg Intravenous Given 12/16/23 2237)  sodium chloride  0.9 % bolus 1,000 mL (1,000 mLs Intravenous New Bag/Given 12/16/23 2237)  ondansetron  (ZOFRAN ) injection 4 mg (4 mg Intravenous Given 12/16/23 2237)      {Click here for ABCD2, HEART and other calculators REFRESH Note before signing:1}                              Medical Decision Making Amount and/or Complexity of Data Reviewed Labs: ordered. Radiology: ordered.  Risk Prescription drug management.   ***  {Document critical  care time when appropriate  Document review of labs and clinical decision tools ie CHADS2VASC2, etc  Document your independent review of radiology images and any outside records  Document your discussion with family members, caretakers and with consultants  Document social determinants of health affecting pt's care  Document your decision making why or why not admission, treatments were needed:32947:::1}   Final diagnoses:  None    ED Discharge Orders     None

## 2023-12-17 ENCOUNTER — Emergency Department (HOSPITAL_COMMUNITY)

## 2023-12-17 ENCOUNTER — Inpatient Hospital Stay (HOSPITAL_COMMUNITY)

## 2023-12-17 ENCOUNTER — Emergency Department (HOSPITAL_COMMUNITY): Admitting: Anesthesiology

## 2023-12-17 ENCOUNTER — Encounter (HOSPITAL_COMMUNITY): Payer: Self-pay | Admitting: Internal Medicine

## 2023-12-17 ENCOUNTER — Other Ambulatory Visit: Payer: Self-pay

## 2023-12-17 ENCOUNTER — Encounter (HOSPITAL_COMMUNITY)
Admission: EM | Disposition: A | Discharge status: Home / Self Care | Payer: Self-pay | Source: Home / Self Care | Attending: Internal Medicine

## 2023-12-17 DIAGNOSIS — R0989 Other specified symptoms and signs involving the circulatory and respiratory systems: Secondary | ICD-10-CM | POA: Diagnosis not present

## 2023-12-17 DIAGNOSIS — F411 Generalized anxiety disorder: Secondary | ICD-10-CM | POA: Diagnosis present

## 2023-12-17 DIAGNOSIS — F17210 Nicotine dependence, cigarettes, uncomplicated: Secondary | ICD-10-CM | POA: Diagnosis not present

## 2023-12-17 DIAGNOSIS — F32A Depression, unspecified: Secondary | ICD-10-CM | POA: Diagnosis not present

## 2023-12-17 DIAGNOSIS — E785 Hyperlipidemia, unspecified: Secondary | ICD-10-CM | POA: Diagnosis not present

## 2023-12-17 DIAGNOSIS — J4540 Moderate persistent asthma, uncomplicated: Secondary | ICD-10-CM | POA: Diagnosis not present

## 2023-12-17 DIAGNOSIS — I158 Other secondary hypertension: Secondary | ICD-10-CM | POA: Diagnosis not present

## 2023-12-17 DIAGNOSIS — K9189 Other postprocedural complications and disorders of digestive system: Secondary | ICD-10-CM | POA: Diagnosis not present

## 2023-12-17 DIAGNOSIS — I10 Essential (primary) hypertension: Secondary | ICD-10-CM | POA: Diagnosis not present

## 2023-12-17 DIAGNOSIS — Z720 Tobacco use: Secondary | ICD-10-CM

## 2023-12-17 DIAGNOSIS — K56609 Unspecified intestinal obstruction, unspecified as to partial versus complete obstruction: Principal | ICD-10-CM | POA: Diagnosis present

## 2023-12-17 DIAGNOSIS — R918 Other nonspecific abnormal finding of lung field: Secondary | ICD-10-CM | POA: Diagnosis not present

## 2023-12-17 DIAGNOSIS — Z4682 Encounter for fitting and adjustment of non-vascular catheter: Secondary | ICD-10-CM | POA: Diagnosis not present

## 2023-12-17 DIAGNOSIS — Z8679 Personal history of other diseases of the circulatory system: Secondary | ICD-10-CM | POA: Diagnosis not present

## 2023-12-17 DIAGNOSIS — J9811 Atelectasis: Secondary | ICD-10-CM | POA: Diagnosis not present

## 2023-12-17 DIAGNOSIS — R1084 Generalized abdominal pain: Secondary | ICD-10-CM | POA: Diagnosis not present

## 2023-12-17 DIAGNOSIS — K5652 Intestinal adhesions [bands] with complete obstruction: Secondary | ICD-10-CM | POA: Diagnosis not present

## 2023-12-17 DIAGNOSIS — E876 Hypokalemia: Secondary | ICD-10-CM | POA: Diagnosis not present

## 2023-12-17 DIAGNOSIS — Z452 Encounter for adjustment and management of vascular access device: Secondary | ICD-10-CM | POA: Diagnosis not present

## 2023-12-17 DIAGNOSIS — D696 Thrombocytopenia, unspecified: Secondary | ICD-10-CM | POA: Diagnosis not present

## 2023-12-17 DIAGNOSIS — Z4659 Encounter for fitting and adjustment of other gastrointestinal appliance and device: Secondary | ICD-10-CM | POA: Diagnosis not present

## 2023-12-17 DIAGNOSIS — R079 Chest pain, unspecified: Secondary | ICD-10-CM | POA: Diagnosis not present

## 2023-12-17 DIAGNOSIS — E870 Hyperosmolality and hypernatremia: Secondary | ICD-10-CM | POA: Diagnosis not present

## 2023-12-17 DIAGNOSIS — K567 Ileus, unspecified: Secondary | ICD-10-CM | POA: Diagnosis not present

## 2023-12-17 DIAGNOSIS — E669 Obesity, unspecified: Secondary | ICD-10-CM | POA: Diagnosis not present

## 2023-12-17 DIAGNOSIS — Z6830 Body mass index (BMI) 30.0-30.9, adult: Secondary | ICD-10-CM | POA: Diagnosis not present

## 2023-12-17 DIAGNOSIS — J90 Pleural effusion, not elsewhere classified: Secondary | ICD-10-CM | POA: Diagnosis not present

## 2023-12-17 DIAGNOSIS — F418 Other specified anxiety disorders: Secondary | ICD-10-CM | POA: Diagnosis not present

## 2023-12-17 DIAGNOSIS — J432 Centrilobular emphysema: Secondary | ICD-10-CM | POA: Diagnosis not present

## 2023-12-17 DIAGNOSIS — E860 Dehydration: Secondary | ICD-10-CM | POA: Diagnosis not present

## 2023-12-17 DIAGNOSIS — K5669 Other partial intestinal obstruction: Secondary | ICD-10-CM | POA: Diagnosis not present

## 2023-12-17 DIAGNOSIS — R188 Other ascites: Secondary | ICD-10-CM | POA: Diagnosis not present

## 2023-12-17 DIAGNOSIS — R110 Nausea: Secondary | ICD-10-CM | POA: Diagnosis present

## 2023-12-17 DIAGNOSIS — J9601 Acute respiratory failure with hypoxia: Secondary | ICD-10-CM | POA: Diagnosis not present

## 2023-12-17 DIAGNOSIS — K6389 Other specified diseases of intestine: Secondary | ICD-10-CM | POA: Diagnosis not present

## 2023-12-17 DIAGNOSIS — I472 Ventricular tachycardia, unspecified: Secondary | ICD-10-CM | POA: Diagnosis not present

## 2023-12-17 DIAGNOSIS — E861 Hypovolemia: Secondary | ICD-10-CM | POA: Diagnosis not present

## 2023-12-17 HISTORY — PX: LAPAROTOMY: SHX154

## 2023-12-17 LAB — COMPREHENSIVE METABOLIC PANEL WITH GFR
ALT: 14 U/L (ref 0–44)
AST: 23 U/L (ref 15–41)
Albumin: 4 g/dL (ref 3.5–5.0)
Alkaline Phosphatase: 71 U/L (ref 38–126)
Anion gap: 12 (ref 5–15)
BUN: 21 mg/dL (ref 8–23)
CO2: 21 mmol/L — ABNORMAL LOW (ref 22–32)
Calcium: 8.4 mg/dL — ABNORMAL LOW (ref 8.9–10.3)
Chloride: 110 mmol/L (ref 98–111)
Creatinine, Ser: 0.83 mg/dL (ref 0.44–1.00)
GFR, Estimated: >60 mL/min (ref >60)
Glucose, Bld: 142 mg/dL — ABNORMAL HIGH (ref 70–99)
Potassium: 3.5 mmol/L (ref 3.5–5.1)
Sodium: 143 mmol/L (ref 135–145)
Total Bilirubin: 0.6 mg/dL (ref 0.0–1.2)
Total Protein: 6.4 g/dL — ABNORMAL LOW (ref 6.5–8.1)

## 2023-12-17 LAB — CBC WITH DIFFERENTIAL/PLATELET
Abs Immature Granulocytes: 0.04 K/uL (ref 0.00–0.07)
Basophils Absolute: 0 K/uL (ref 0.0–0.1)
Basophils Relative: 0 %
Eosinophils Absolute: 0 K/uL (ref 0.0–0.5)
Eosinophils Relative: 0 %
HCT: 41.9 % (ref 36.0–46.0)
Hemoglobin: 13.7 g/dL (ref 12.0–15.0)
Immature Granulocytes: 0 %
Lymphocytes Relative: 10 %
Lymphs Abs: 1.1 K/uL (ref 0.7–4.0)
MCH: 29.2 pg (ref 26.0–34.0)
MCHC: 32.7 g/dL (ref 30.0–36.0)
MCV: 89.3 fL (ref 80.0–100.0)
Monocytes Absolute: 0.5 K/uL (ref 0.1–1.0)
Monocytes Relative: 4 %
Neutro Abs: 9.4 K/uL — ABNORMAL HIGH (ref 1.7–7.7)
Neutrophils Relative %: 86 %
Platelets: 142 K/uL — ABNORMAL LOW (ref 150–400)
RBC: 4.69 MIL/uL (ref 3.87–5.11)
RDW: 14.2 % (ref 11.5–15.5)
WBC: 11.1 K/uL — ABNORMAL HIGH (ref 4.0–10.5)
nRBC: 0 % (ref 0.0–0.2)

## 2023-12-17 LAB — URINALYSIS, ROUTINE W REFLEX MICROSCOPIC
Bilirubin Urine: NEGATIVE
Glucose, UA: NEGATIVE mg/dL
Hgb urine dipstick: NEGATIVE
Ketones, ur: 5 mg/dL — AB
Nitrite: NEGATIVE
Protein, ur: 100 mg/dL — AB
Specific Gravity, Urine: 1.019 (ref 1.005–1.030)
pH: 7 (ref 5.0–8.0)

## 2023-12-17 LAB — TYPE AND SCREEN
ABO/RH(D): O POS
Antibody Screen: NEGATIVE

## 2023-12-17 LAB — PHOSPHORUS: Phosphorus: 3.9 mg/dL (ref 2.5–4.6)

## 2023-12-17 LAB — MAGNESIUM: Magnesium: 1.7 mg/dL (ref 1.7–2.4)

## 2023-12-17 SURGERY — LAPAROTOMY, EXPLORATORY
Anesthesia: General

## 2023-12-17 MED ORDER — OXYCODONE HCL 5 MG/5ML PO SOLN: 5.0000 mg | Freq: Once | ORAL | Status: DC | PRN

## 2023-12-17 MED ORDER — ACETAMINOPHEN 10 MG/ML IV SOLN
INTRAVENOUS | Status: DC | PRN
  Administered 2023-12-17: 1000 mg via INTRAVENOUS

## 2023-12-17 MED ORDER — HYDROMORPHONE HCL 1 MG/ML IJ SOLN: 0.5000 mg | INTRAMUSCULAR | Status: DC | PRN

## 2023-12-17 MED ORDER — DEXAMETHASONE SOD PHOSPHATE PF 10 MG/ML IJ SOLN
INTRAMUSCULAR | Status: DC | PRN
  Administered 2023-12-17: 10 mg via INTRAVENOUS

## 2023-12-17 MED ORDER — ONDANSETRON HCL 4 MG/2ML IJ SOLN
INTRAMUSCULAR | Status: DC | PRN
  Administered 2023-12-17: 4 mg via INTRAVENOUS

## 2023-12-17 MED ORDER — FENTANYL CITRATE (PF) 50 MCG/ML IJ SOSY
25.0000 ug | PREFILLED_SYRINGE | INTRAMUSCULAR | Status: DC | PRN
  Administered 2023-12-17 (×2): 50 ug via INTRAVENOUS

## 2023-12-17 MED ORDER — LACTATED RINGERS IV SOLN: INTRAVENOUS | Status: AC

## 2023-12-17 MED ORDER — MAGNESIUM SULFATE 2 GM/50ML IV SOLN
2.0000 g | Freq: Once | INTRAVENOUS | Status: AC
Start: 2023-12-17 — End: 2023-12-17
  Administered 2023-12-17: 2 g via INTRAVENOUS
  Filled 2023-12-17: qty 50

## 2023-12-17 MED ORDER — ENOXAPARIN SODIUM 40 MG/0.4ML IJ SOSY
40.0000 mg | PREFILLED_SYRINGE | Freq: Every day | INTRAMUSCULAR | Status: DC
  Administered 2023-12-17 – 2023-12-27 (×11): 40 mg via SUBCUTANEOUS
  Filled 2023-12-17 (×11): qty 0.4

## 2023-12-17 MED ORDER — ALBUTEROL SULFATE (2.5 MG/3ML) 0.083% IN NEBU
2.5000 mg | INHALATION_SOLUTION | RESPIRATORY_TRACT | Status: DC | PRN
  Administered 2023-12-21: 2.5 mg via RESPIRATORY_TRACT
  Filled 2023-12-17: qty 3

## 2023-12-17 MED ORDER — PNEUMOCOCCAL 20-VAL CONJ VACC 0.5 ML IM SUSY
0.5000 mL | PREFILLED_SYRINGE | INTRAMUSCULAR | Status: DC
  Filled 2023-12-17: qty 0.5

## 2023-12-17 MED ORDER — SODIUM CHLORIDE 0.9 % IV BOLUS
1000.0000 mL | Freq: Once | INTRAVENOUS | Status: AC
  Administered 2023-12-17: 1000 mL via INTRAVENOUS

## 2023-12-17 MED ORDER — ROCURONIUM BROMIDE 10 MG/ML (PF) SYRINGE
PREFILLED_SYRINGE | INTRAVENOUS | Status: DC | PRN
  Administered 2023-12-17: 50 mg via INTRAVENOUS

## 2023-12-17 MED ORDER — MIDAZOLAM HCL (PF) 2 MG/2ML IJ SOLN
INTRAMUSCULAR | Status: DC | PRN
  Administered 2023-12-17: 2 mg via INTRAVENOUS

## 2023-12-17 MED ORDER — DIAZEPAM 5 MG/ML IJ SOLN
5.0000 mg | Freq: Four times a day (QID) | INTRAMUSCULAR | Status: DC | PRN
  Administered 2023-12-17 – 2023-12-25 (×4): 5 mg via INTRAVENOUS
  Filled 2023-12-17 (×4): qty 2

## 2023-12-17 MED ORDER — FENTANYL CITRATE (PF) 100 MCG/2ML IJ SOLN
INTRAMUSCULAR | Status: AC
  Filled 2023-12-17: qty 2

## 2023-12-17 MED ORDER — FENTANYL CITRATE (PF) 250 MCG/5ML IJ SOLN
INTRAMUSCULAR | Status: DC | PRN
  Administered 2023-12-17 (×4): 50 ug via INTRAVENOUS

## 2023-12-17 MED ORDER — SODIUM CHLORIDE 0.9 % IV SOLN: INTRAVENOUS | Status: DC | PRN

## 2023-12-17 MED ORDER — NALOXONE HCL 0.4 MG/ML IJ SOLN
0.4000 mg | INTRAMUSCULAR | Status: DC | PRN
Start: 2023-12-17 — End: 2023-12-28

## 2023-12-17 MED ORDER — OXYCODONE HCL 5 MG PO TABS: 5.0000 mg | ORAL_TABLET | Freq: Once | ORAL | Status: DC | PRN

## 2023-12-17 MED ORDER — NALOXONE HCL 0.4 MG/ML IJ SOLN: 0.4000 mg | INTRAMUSCULAR | Status: DC | PRN

## 2023-12-17 MED ORDER — INFLUENZA VIRUS VACC SPLIT PF (FLUZONE) 0.5 ML IM SUSY
0.5000 mL | PREFILLED_SYRINGE | INTRAMUSCULAR | Status: DC
  Filled 2023-12-17: qty 0.5

## 2023-12-17 MED ORDER — ACETAMINOPHEN 10 MG/ML IV SOLN
INTRAVENOUS | Status: AC
  Filled 2023-12-17: qty 100

## 2023-12-17 MED ORDER — ONDANSETRON HCL 4 MG/2ML IJ SOLN: 4.0000 mg | Freq: Four times a day (QID) | INTRAMUSCULAR | Status: DC | PRN

## 2023-12-17 MED ORDER — SODIUM CHLORIDE 0.9 % IV SOLN: 12.5000 mg | INTRAVENOUS | Status: DC | PRN

## 2023-12-17 MED ORDER — LIDOCAINE 2% (20 MG/ML) 5 ML SYRINGE
INTRAMUSCULAR | Status: DC | PRN
  Administered 2023-12-17: 70 mg via INTRAVENOUS

## 2023-12-17 MED ORDER — FENTANYL CITRATE (PF) 50 MCG/ML IJ SOSY
PREFILLED_SYRINGE | INTRAMUSCULAR | Status: AC
  Filled 2023-12-17: qty 1

## 2023-12-17 MED ORDER — PROPOFOL 10 MG/ML IV BOLUS
INTRAVENOUS | Status: DC | PRN
  Administered 2023-12-17: 140 mg via INTRAVENOUS

## 2023-12-17 MED ORDER — ACETAMINOPHEN 650 MG RE SUPP: 650.0000 mg | Freq: Four times a day (QID) | RECTAL | Status: DC | PRN

## 2023-12-17 MED ORDER — CEFAZOLIN SODIUM-DEXTROSE 2-4 GM/100ML-% IV SOLN
2.0000 g | Freq: Once | INTRAVENOUS | Status: AC
  Administered 2023-12-17: 2 g via INTRAVENOUS
  Filled 2023-12-17: qty 100

## 2023-12-17 MED ORDER — ACETAMINOPHEN 10 MG/ML IV SOLN
1000.0000 mg | Freq: Four times a day (QID) | INTRAVENOUS | Status: AC
Start: 2023-12-17 — End: 2023-12-18
  Administered 2023-12-17 – 2023-12-18 (×4): 1000 mg via INTRAVENOUS
  Filled 2023-12-17 (×4): qty 100

## 2023-12-17 MED ORDER — MIDAZOLAM HCL 2 MG/2ML IJ SOLN
INTRAMUSCULAR | Status: AC
  Filled 2023-12-17: qty 2

## 2023-12-17 MED ORDER — LABETALOL HCL 5 MG/ML IV SOLN
5.0000 mg | INTRAVENOUS | Status: DC | PRN
  Administered 2023-12-17: 5 mg via INTRAVENOUS

## 2023-12-17 MED ORDER — ONDANSETRON HCL 4 MG/2ML IJ SOLN
4.0000 mg | Freq: Four times a day (QID) | INTRAMUSCULAR | Status: DC | PRN
  Administered 2023-12-23 – 2023-12-24 (×2): 4 mg via INTRAVENOUS
  Filled 2023-12-17 (×2): qty 2

## 2023-12-17 MED ORDER — SUCCINYLCHOLINE CHLORIDE 200 MG/10ML IV SOSY
PREFILLED_SYRINGE | INTRAVENOUS | Status: DC | PRN
  Administered 2023-12-17: 120 mg via INTRAVENOUS

## 2023-12-17 MED ORDER — SUGAMMADEX SODIUM 200 MG/2ML IV SOLN
INTRAVENOUS | Status: DC | PRN
  Administered 2023-12-17: 150 mg via INTRAVENOUS

## 2023-12-17 MED ORDER — HYDROMORPHONE 1 MG/ML IV SOLN
INTRAVENOUS | Status: DC
  Administered 2023-12-17: 2.1 mg via INTRAVENOUS
  Administered 2023-12-17: 1.8 mg via INTRAVENOUS
  Administered 2023-12-17: 2.1 mg via INTRAVENOUS
  Administered 2023-12-17: 30 mg via INTRAVENOUS
  Administered 2023-12-17: 2.7 mg via INTRAVENOUS
  Administered 2023-12-18: 1.2 mg via INTRAVENOUS
  Administered 2023-12-18: 2.7 mg via INTRAVENOUS
  Administered 2023-12-18: 2.1 mg via INTRAVENOUS
  Administered 2023-12-18: 2.7 mg via INTRAVENOUS
  Administered 2023-12-18: 2.1 mg via INTRAVENOUS
  Administered 2023-12-18: 30 mg via INTRAVENOUS
  Administered 2023-12-18: 3 mg via INTRAVENOUS
  Administered 2023-12-19: 3.3 mL via INTRAVENOUS
  Administered 2023-12-19 (×3): 30 mg via INTRAVENOUS
  Administered 2023-12-19: 2.7 mL via INTRAVENOUS
  Administered 2023-12-20: 1.8 mL via INTRAVENOUS
  Administered 2023-12-20: 2.7 mg via INTRAVENOUS
  Administered 2023-12-20: 2.4 mg via INTRAVENOUS
  Administered 2023-12-20: 3 mg via INTRAVENOUS
  Administered 2023-12-20: 1.8 mL via INTRAVENOUS
  Administered 2023-12-20: 2.4 mg via INTRAVENOUS
  Administered 2023-12-21: 5.7 mg via INTRAVENOUS
  Administered 2023-12-21: 2.1 mg via INTRAVENOUS
  Administered 2023-12-21: 2.7 mg via INTRAVENOUS
  Administered 2023-12-22: 3 mg via INTRAVENOUS
  Filled 2023-12-17 (×3): qty 30

## 2023-12-17 MED ORDER — HYDROMORPHONE HCL 1 MG/ML IJ SOLN
1.0000 mg | Freq: Once | INTRAMUSCULAR | Status: AC
  Administered 2023-12-17: 1 mg via INTRAVENOUS
  Filled 2023-12-17: qty 1

## 2023-12-17 MED ORDER — PROPOFOL 10 MG/ML IV BOLUS
INTRAVENOUS | Status: AC
  Filled 2023-12-17: qty 20

## 2023-12-17 MED ORDER — DIPHENHYDRAMINE HCL 50 MG/ML IJ SOLN: 12.5000 mg | Freq: Four times a day (QID) | INTRAMUSCULAR | Status: DC | PRN

## 2023-12-17 MED ORDER — ACETAMINOPHEN 325 MG PO TABS: 650.0000 mg | ORAL_TABLET | Freq: Four times a day (QID) | ORAL | Status: DC | PRN

## 2023-12-17 MED ORDER — CEFAZOLIN SODIUM-DEXTROSE 2-4 GM/100ML-% IV SOLN
INTRAVENOUS | Status: AC
  Filled 2023-12-17: qty 100

## 2023-12-17 MED ORDER — NICOTINE 14 MG/24HR TD PT24
14.0000 mg | MEDICATED_PATCH | Freq: Every day | TRANSDERMAL | Status: DC | PRN
  Administered 2023-12-21: 14 mg via TRANSDERMAL
  Filled 2023-12-17: qty 1

## 2023-12-17 MED ORDER — AMISULPRIDE (ANTIEMETIC) 5 MG/2ML IV SOLN: 10.0000 mg | Freq: Once | INTRAVENOUS | Status: DC | PRN

## 2023-12-17 MED ORDER — SODIUM CHLORIDE 0.9% FLUSH: 9.0000 mL | INTRAVENOUS | Status: DC | PRN

## 2023-12-17 MED ORDER — LABETALOL HCL 5 MG/ML IV SOLN
INTRAVENOUS | Status: AC
  Filled 2023-12-17: qty 4

## 2023-12-17 MED ORDER — DIPHENHYDRAMINE HCL 12.5 MG/5ML PO ELIX: 12.5000 mg | ORAL_SOLUTION | Freq: Four times a day (QID) | ORAL | Status: DC | PRN

## 2023-12-17 MED ORDER — CHLORHEXIDINE GLUCONATE CLOTH 2 % EX PADS
6.0000 | MEDICATED_PAD | Freq: Every day | CUTANEOUS | Status: DC
  Administered 2023-12-17 – 2023-12-27 (×6): 6 via TOPICAL

## 2023-12-17 MED ORDER — SODIUM CHLORIDE 0.9 % IR SOLN
Status: DC | PRN
  Administered 2023-12-17 (×2): 1000 mL

## 2023-12-17 MED ORDER — FLUTICASONE FUROATE-VILANTEROL 200-25 MCG/ACT IN AEPB
1.0000 | INHALATION_SPRAY | Freq: Every day | RESPIRATORY_TRACT | Status: DC
  Administered 2023-12-17: 1 via RESPIRATORY_TRACT
  Filled 2023-12-17: qty 28

## 2023-12-17 SURGICAL SUPPLY — 42 items
APPLICATOR COTTON TIP 6 STRL (MISCELLANEOUS) ×4 IMPLANT
BAG COUNTER SPONGE SURGICOUNT (BAG) IMPLANT
BLADE EXTENDED COATED 6.5IN (ELECTRODE) IMPLANT
BLADE HEX COATED 2.75 (ELECTRODE) ×2 IMPLANT
BLADE SURG SZ10 CARB STEEL (BLADE) IMPLANT
COVER MAYO STAND STRL (DRAPES) ×2 IMPLANT
COVER SURGICAL LIGHT HANDLE (MISCELLANEOUS) ×2 IMPLANT
DRAIN CHANNEL 19F RND (DRAIN) IMPLANT
DRAPE LAPAROSCOPIC ABDOMINAL (DRAPES) ×2 IMPLANT
DRAPE SHEET LG 3/4 BI-LAMINATE (DRAPES) IMPLANT
DRAPE WARM FLUID 44X44 (DRAPES) ×2 IMPLANT
DRSG OPSITE POSTOP 4X10 (GAUZE/BANDAGES/DRESSINGS) IMPLANT
DRSG OPSITE POSTOP 4X12 (GAUZE/BANDAGES/DRESSINGS) IMPLANT
DRSG OPSITE POSTOP 4X8 (GAUZE/BANDAGES/DRESSINGS) IMPLANT
ELECT REM PT RETURN 15FT ADLT (MISCELLANEOUS) ×2 IMPLANT
EVACUATOR DRAINAGE 10X20 100CC (DRAIN) IMPLANT
GAUZE PAD ABD 8X10 STRL (GAUZE/BANDAGES/DRESSINGS) IMPLANT
GAUZE SPONGE 4X4 12PLY STRL (GAUZE/BANDAGES/DRESSINGS) ×2 IMPLANT
GLOVE BIO SURGEON STRL SZ 6 (GLOVE) ×6 IMPLANT
GLOVE INDICATOR 6.5 STRL GRN (GLOVE) ×4 IMPLANT
GOWN STRL REUS W/ TWL XL LVL3 (GOWN DISPOSABLE) ×8 IMPLANT
HANDLE SUCTION POOLE (INSTRUMENTS) ×2 IMPLANT
KIT BASIN OR (CUSTOM PROCEDURE TRAY) ×2 IMPLANT
KIT TURNOVER KIT A (KITS) ×2 IMPLANT
LEGGING LITHOTOMY PAIR STRL (DRAPES) IMPLANT
LIGASURE IMPACT 36 18CM CVD LR (INSTRUMENTS) ×2 IMPLANT
NS IRRIG 1000ML POUR BTL (IV SOLUTION) ×4 IMPLANT
PACK GENERAL/GYN (CUSTOM PROCEDURE TRAY) ×2 IMPLANT
RELOAD STAPLE 75 3.8 BLU REG (ENDOMECHANICALS) ×4 IMPLANT
RETRACTOR WND ALEXIS 25 LRG (MISCELLANEOUS) IMPLANT
STAPLER PROXIMATE 75MM BLUE (STAPLE) ×2 IMPLANT
STAPLER SKIN PROX 35W (STAPLE) ×2 IMPLANT
SUT PDS AB 1 TP1 96 (SUTURE) IMPLANT
SUT SILK 2 0 SH CR/8 (SUTURE) ×2 IMPLANT
SUT SILK 3 0 SH CR/8 (SUTURE) ×2 IMPLANT
SUT VIC AB 2-0 SH 18 (SUTURE) IMPLANT
SUT VIC AB 3-0 SH 18 (SUTURE) IMPLANT
SUT VIC AB 3-0 SH 8-18 (SUTURE) IMPLANT
SUT VICRYL 3-0 CR8 SH (SUTURE) IMPLANT
TOWEL OR 17X26 10 PK STRL BLUE (TOWEL DISPOSABLE) ×4 IMPLANT
TRAY FOLEY MTR SLVR 16FR STAT (SET/KITS/TRAYS/PACK) ×2 IMPLANT
YANKAUER SUCT BULB TIP NO VENT (SUCTIONS) ×2 IMPLANT

## 2023-12-17 NOTE — Plan of Care (Signed)

## 2023-12-17 NOTE — Plan of Care (Signed)
   Problem: Health Behavior/Discharge Planning: Goal: Ability to manage health-related needs will improve Outcome: Progressing   Problem: Activity: Goal: Risk for activity intolerance will decrease Outcome: Progressing

## 2023-12-17 NOTE — Anesthesia Procedure Notes (Signed)
 Procedure Name: Intubation Date/Time: 12/17/2023 2:31 AM  Performed by: Claudene Hoy CROME, CRNAPre-anesthesia Checklist: Patient identified, Emergency Drugs available, Suction available and Patient being monitored Patient Re-evaluated:Patient Re-evaluated prior to induction Oxygen Delivery Method: Circle System Utilized Preoxygenation: Pre-oxygenation with 100% oxygen Induction Type: IV induction, Rapid sequence and Cricoid Pressure applied Laryngoscope Size: Mac and 3 Grade View: Grade I Tube type: Oral Number of attempts: 1 Airway Equipment and Method: Stylet Placement Confirmation: ETT inserted through vocal cords under direct vision, positive ETCO2 and breath sounds checked- equal and bilateral Secured at: 21 cm Tube secured with: Tape Dental Injury: Teeth and Oropharynx as per pre-operative assessment

## 2023-12-17 NOTE — Progress Notes (Addendum)
   12/17/23 1646  TOC Brief Assessment  Insurance and Status Reviewed  Patient has primary care physician Yes  Home environment has been reviewed single family home  Prior level of function: independent  Prior/Current Home Services No current home services  Social Drivers of Health Review SDOH reviewed no interventions necessary  Readmission risk has been reviewed Yes  Transition of care needs no transition of care needs at this time    Pt had surgery today for small bowel obstruction. Pt currently on 2L of O2.  Signed: Heather Saltness, MSW, LCSW Clinical Social Worker Inpatient Care Management 12/17/2023 4:50 PM

## 2023-12-17 NOTE — Plan of Care (Addendum)
 Progress Note    JULIEANA ESHLEMAN   FMW:[id number]  DOB: 07/23/1960  DOA: 12/16/2023     0 Date of Service: 12/17/2023   Hospital course:   Per H&P HPI: [name] Walker is a 63 y.o. female with medical history significant for essential hypertension, moderate persistent asthma, GAD, who is admitted to [facility] on 12/16/2023 with small bowel striction after presenting from home to [facility] ED complaining of abdominal pain.    The patient reports 2 days of generalized sharp, nonradiating abdominal discomfort, worse with palpation.  This has been associated with intermittent nausea over that timeframe, without vomiting.  Additionally, this been associated with diminished flatus production, with the patient noting that most recent bowel movement occurred yesterday.  Denies any associated subjective fever, chills, rigors, or generalized myalgias.  No associated any acute dysuria.  No recent chest pain, shortness of breath, orthopnea or PND.  She is on daily baby aspirin  at home, but otherwise no additional blood thinners as an outpatient.   Prior abdominal surgical procedures include a history of tubal ligation.    Subjective:  Having some abd pain, but is improved with PCA. No flatus or BM.   Hospital Problems Assessment and Plan: SBO S/p Ex lap w/ LOA 10/20.  AROBF NGT, PCA  Continue IV fluids  Hypercalcemia  Likely due to dehydration Resolved with IV fluids    HTN On amlodipine  and losartan   Vitals:   12/17/23 1525 12/17/23 1559  BP:  113/82  Pulse:  63  Resp: 15   Temp:  97.8 F (36.6 C)  SpO2: 94% 98%   Mostly normotensive. Will hold as not taking PO.   AKI  Resolved.   Leukocytosis  Likely reactive d/t recent OR  Moderate persistent asthma Continue home advair and PRN abluterol   GAD On PRN ativan  at home.  PRN IV valium  for anxiety   Tobacoo use disorder  Nicotine  patch   Objective    12/17/2023    3:59 PM 12/17/2023    8:54 AM 12/17/2023     6:05 AM  Vitals with BMI  Systolic 113 135 860  Diastolic 82 90 95  Pulse 63 74 74   Physical Exam  Constitutional: In no distress.  Cardiovascular: Normal rate, regular rhythm. No lower extremity edema  Pulmonary: Non labored breathing on room air, no wheezing or rales.   Abdominal: Soft.  Mildly distended.  Midline incision with minimal sanguinous drainage. Musculoskeletal: Normal range of motion.     Neurological: Alert and oriented to person, place, and time. Non focal  Skin: Skin is warm and dry.     Labs / Other Information    Latest Ref Rng & Units 12/17/2023    5:33 AM 12/16/2023   10:32 PM 12/11/2023    4:52 PM  CBC  WBC 4.0 - 10.5 K/uL 11.1  6.9  10.6   Hemoglobin 12.0 - 15.0 g/dL 86.2  83.8  85.4   Hematocrit 36.0 - 46.0 % 41.9  49.9  45.5   Platelets 150 - 400 K/uL 142  162  120       Latest Ref Rng & Units 12/17/2023    5:33 AM 12/16/2023   10:32 PM 12/11/2023    4:52 PM  BMP  Glucose 70 - 99 mg/dL 857  828  97   BUN 8 - 23 mg/dL 21  22  17    Creatinine 0.44 - 1.00 mg/dL 9.16  8.90  8.68   Sodium 135 -  145 mmol/L 143  144  143   Potassium 3.5 - 5.1 mmol/L 3.5  3.6  4.1   Chloride 98 - 111 mmol/L 110  104  108   CO2 22 - 32 mmol/L 21  22  21    Calcium  8.9 - 10.3 mg/dL 8.4  89.0  89.6       Alban Pepper MD  Time spent:20 min [facility] 12/17/2023, 4:14 PM  7p-7a please find overnight on amion

## 2023-12-17 NOTE — H&P (Signed)
 History and Physical      [name] FMW:[id number] DOB: November 24, 1960 DOA: 12/16/2023; DOS: 12/17/2023  PCP: Norleen Lynwood ORN, MD  Patient coming from: home   I have personally briefly reviewed patient's old medical records in [facility]  Chief Complaint: Abdominal pain  HPI: [name] Walker is a 63 y.o. female with medical history significant for essential hypertension, moderate persistent asthma, GAD, who is admitted to [facility] on 12/16/2023 with small bowel striction after presenting from home to [facility] ED complaining of abdominal pain.   The patient reports 2 days of generalized sharp, nonradiating abdominal discomfort, worse with palpation.  This has been associated with intermittent nausea over that timeframe, without vomiting.  Additionally, this been associated with diminished flatus production, with the patient noting that most recent bowel movement occurred yesterday.  Denies any associated subjective fever, chills, rigors, or generalized myalgias.  No associated any acute dysuria.  No recent chest pain, shortness of breath, orthopnea or PND.  She is on daily baby aspirin  at home, but otherwise no additional blood thinners as an outpatient.  Prior abdominal surgical procedures include a history of tubal ligation.     ED Course:  Vital signs in the ED were notable for the following: Afebrile; heart rates initially in the low 100; systolic blood pressures in the 140s; respiratory rate 17-26, oxygen saturation 100% on room air.  Labs were notable for the following: BMP was notable for the following: Creatinine 1.09 compared to 1.31 on 12/11/2023, calcium  10.9.  CBC notable for white blood cell count 6900, hemoglobin 16.1, platelet count 162.  Urinalysis showed 6-10 white blood cells, nitrate negative, and was positive for hyaline cast.  Per my interpretation, EKG in ED demonstrated the following: No EKG performed in the ED today.  Imaging in the ED, per  corresponding formal radiology read, was notable for the following: CT renal stone study showed evidence of closed-loop small bowel obstruction in the mid left hemiabdomen, with 2 adjacent transition points, without any corresponding evidence of bowel abscess or perforation.  EDP d/w on-call general surgery, Dr. Aron, who will formally consult and is planning to take pt to the OR overnight for exploratory laparotomy.   While in the ED, the following were administered: Morphine  4 mg IV x 1 dose, Dilaudid  1 mg IV x 1 dose, Zofran  4 mg IV x 1 dose, normal saline x 1 L bolus.  Subsequently, the patient was admitted for further evaluation management of his suspected closed-loop small bowel obstruction, with presenting labs also notable for mild hypercalcemia.    Review of Systems: As per HPI otherwise 10 point review of systems negative.   Past Medical History:  Diagnosis Date   ACHILLES TENDINITIS    ALLERGIC RHINITIS    Allergy     Anginal pain 04/04/2016   Pt currently feels like she is having muscle spasms and aching in her chest   Anxiety    Asthma    Chronic kidney disease    Depression    Eczema    GERD    History of kidney stones    Hyperlipidemia    HYPERTENSION    Kidney stone    LATERAL EPICONDYLITIS, RIGHT    LOW BACK PAIN    Plantar fascial fibromatosis    Sciatica    Stress    Stroke (HCC)    SUBACROMIAL BURSITIS, RIGHT    Thoracic scoliosis 10/01/2015   TIA (transient ischemic attack)     Past Surgical  History:  Procedure Laterality Date   ANTERIOR CERVICAL DECOMP/DISCECTOMY FUSION N/A 04/07/2016   Procedure: Cervical two-three Anterior cervical decompression/discectomy/fusion;  Surgeon: Rockey Peru, MD;  Location: [facility] OR;  Service: Neurosurgery;  Laterality: N/A;   BACK SURGERY  2015   lower back   CYSTOSCOPY/URETEROSCOPY/HOLMIUM LASER/STENT PLACEMENT Right 11/21/2022   Procedure: CYSTOSCOPY RIGHT RETROGRADE PYELOGRAM RIGHT URETEROSCOPY/HOLMIUM LASER/STENT  PLACEMENT;  Surgeon: Nieves Cough, MD;  Location: [facility] ORS;  Service: Urology;  Laterality: Right;  60 MINS FOR CASE   EXTRACORPOREAL SHOCK WAVE LITHOTRIPSY Left 09/28/2016   Procedure: LEFT EXTRACORPOREAL SHOCK WAVE LITHOTRIPSY (ESWL);  Surgeon: Nieves Cough, MD;  Location: [facility] ORS;  Service: Urology;  Laterality: Left;   EXTRACORPOREAL SHOCK WAVE LITHOTRIPSY Right 04/08/2018   Procedure: EXTRACORPOREAL SHOCK WAVE LITHOTRIPSY (ESWL);  Surgeon: Cam Morene ORN, MD;  Location: [facility] ORS;  Service: Urology;  Laterality: Right;   KIDNEY SURGERY     LITHOTRIPSY  05/2016   TUBAL LIGATION      Social History:  reports that she has been smoking cigarettes. She has a 10 pack-year smoking history. She has never been exposed to tobacco smoke. She has never used smokeless tobacco. She reports that she does not drink alcohol and does not use drugs.   Allergies  Allergen Reactions   Flexeril  [Cyclobenzaprine ] Swelling    Tongue swells   Lisinopril  Other (See Comments)    Possible tongue swelling     Other Palpitations    ALL MUSCLE RELAXERS-PER PATIENT   Robaxin  [Methocarbamol ] Other (See Comments)    Tongue swelling   Contrast Media [Iodinated Contrast Media] Itching   Iodine Itching   Gabapentin  Other (See Comments)   Pregabalin Anxiety and Swelling    Anxiety attacks    Family History  Problem Relation Age of Onset   Diabetes Mother    Dementia Mother    Alzheimer's disease Mother    Colon cancer Father        dx in his 60s   Prostate cancer Father    Colon cancer Sister        dx in her 47s   Breast cancer Maternal Aunt        50's   Esophageal cancer Neg Hx    Stomach cancer Neg Hx    Crohn's disease Neg Hx    Rectal cancer Neg Hx      Prior to Admission medications   Medication Sig Start Date End Date Taking? Authorizing Provider  acetaminophen -codeine  (TYLENOL  #4) 300-60 MG tablet Take 1 tablet by mouth every 4 (four) hours as needed for moderate pain (pain score  4-6). 12/07/23   Carilyn Prentice BRAVO, MD  albuterol  (VENTOLIN  HFA) 108 (90 Base) MCG/ACT inhaler INHALE 2 PUFFS BY MOUTH EVERY 6 HOURS AS NEEDED FOR WHEEZING FOR SHORTNESS OF BREATH 02/02/23   Alvia Corean CROME, FNP  alendronate  (FOSAMAX ) 70 MG tablet Take 1 tablet (70 mg total) by mouth every 7 (seven) days. Take with a full glass of water on an empty stomach. 12/05/22   Prentiss Annabella LABOR, NP  amLODipine  (NORVASC ) 5 MG tablet Take 1 tablet (5 mg total) by mouth daily. 11/01/23   Norleen Lynwood ORN, MD  aspirin  EC 81 MG tablet Take 1 tablet (81 mg total) by mouth daily. Swallow whole. 09/15/19   Norleen Lynwood ORN, MD  atorvastatin  (LIPITOR) 20 MG tablet Take 1 tablet (20 mg total) by mouth daily. 07/28/22 12/07/23  Norleen Lynwood ORN, MD  Cholecalciferol (THERA-D 2000) 50 MCG (2000 UT) TABS 1 tab  by mouth once daily 06/30/20   Norleen Lynwood ORN, MD  citalopram  (CELEXA ) 40 MG tablet Take 1 tablet (40 mg total) by mouth daily. 11/01/23   Norleen Lynwood ORN, MD  fluticasone  (FLONASE ) 50 MCG/ACT nasal spray Place 1 spray into both nostrils in the morning and at bedtime. 10/02/23   Iva Marty Saltness, MD  fluticasone  (FLONASE ) 50 MCG/ACT nasal spray Place 2 sprays into both nostrils daily. 10/30/23   Couture, Cortni S, PA-C  fluticasone -salmeterol (ADVAIR DISKUS) 250-50 MCG/ACT AEPB Inhale 1 puff into the lungs daily. Can increase to twice daily during respiratory flares. 10/02/23   Iva Marty Saltness, MD  levocetirizine (XYZAL ) 5 MG tablet Take 1 tablet (5 mg total) by mouth every evening. 10/02/23   Iva Marty Saltness, MD  lidocaine  (LIDODERM ) 5 % Place 1 patch onto the skin daily. Remove & Discard patch within 12 hours or as directed by MD 05/02/22   Norleen Lynwood ORN, MD  LORazepam  (ATIVAN ) 1 MG tablet Take 1 tablet by mouth twice daily as needed for anxiety 12/14/23   Norleen Lynwood ORN, MD  losartan  (COZAAR ) 50 MG tablet Take 1 tablet by mouth once daily 11/21/22   Norleen Lynwood ORN, MD  ondansetron  (ZOFRAN ) 4 MG tablet Take 1 tablet (4 mg  total) by mouth every 6 (six) hours. 12/11/23   Robinson, John K, PA-C  pantoprazole  (PROTONIX ) 40 MG tablet TAKE 1 TABLET BY MOUTH TWICE DAILY BEFORE A MEAL 06/12/23   Norleen Lynwood ORN, MD  traZODone  (DESYREL ) 100 MG tablet TAKE 1 TABLET BY MOUTH AT BEDTIME 11/12/23   Norleen Lynwood ORN, MD     Objective    Physical Exam: Vitals:   12/16/23 2155  BP: (!) 148/107  Pulse: 100  Resp: (!) 21  Temp: 97.9 F (36.6 C)  TempSrc: Oral  SpO2: 100%    General: appears to be stated age; alert Skin: warm, dry, no rash Head:  AT/[place] Mouth:  Oral mucosa membranes appear dry, normal dentition Neck: supple; trachea midline Heart:  mildly tachycardic, but regular; did not appreciate any M/R/G Lungs: CTAB, did not appreciate any wheezes, rales, or rhonchi Abdomen:  generalized tenderness to palpation; hypoactive BS's. Vascular: 2+ pedal pulses b/l; 2+ radial pulses b/l Extremities: no peripheral edema, no muscle wasting   Labs on Admission: I have personally reviewed following labs and imaging studies  CBC: Recent Labs  Lab 12/11/23 1652 12/16/23 2232  WBC 10.6* 6.9  NEUTROABS 6.7 4.8  HGB 14.5 16.1*  HCT 45.5 49.9*  MCV 87.8 86.5  PLT 120* 162   Basic Metabolic Panel: Recent Labs  Lab 12/11/23 1652 12/16/23 2232  NA 143 144  K 4.1 3.6  CL 108 104  CO2 21* 22  GLUCOSE 97 171*  BUN 17 22  CREATININE 1.31* 1.09*  CALCIUM  10.3 10.9*   GFR: Estimated Creatinine Clearance: 46.1 mL/min (A) (by C-G formula based on SCr of 1.09 mg/dL (H)). Liver Function Tests: Recent Labs  Lab 12/11/23 1652  AST 29  ALT 21  ALKPHOS 87  BILITOT 0.8  PROT 7.2  ALBUMIN 4.3   Recent Labs  Lab 12/11/23 1652  LIPASE 21   No results for input(s): AMMONIA in the last 168 hours. Coagulation Profile: No results for input(s): INR, PROTIME in the last 168 hours. Cardiac Enzymes: No results for input(s): CKTOTAL, CKMB, CKMBINDEX, TROPONINI in the last 168 hours. BNP (last 3  results) No results for input(s): PROBNP in the last 8760 hours. HbA1C: No results for input(s):  HGBA1C in the last 72 hours. CBG: No results for input(s): GLUCAP in the last 168 hours. Lipid Profile: No results for input(s): CHOL, HDL, LDLCALC, TRIG, CHOLHDL, LDLDIRECT in the last 72 hours. Thyroid  Function Tests: No results for input(s): TSH, T4TOTAL, FREET4, T3FREE, THYROIDAB in the last 72 hours. Anemia Panel: No results for input(s): VITAMINB12, FOLATE, FERRITIN, TIBC, IRON, RETICCTPCT in the last 72 hours. Urine analysis:    Component Value Date/Time   COLORURINE AMBER (A) 12/16/2023 0006   APPEARANCEUR HAZY (A) 12/16/2023 0006   LABSPEC 1.019 12/16/2023 0006   PHURINE 7.0 12/16/2023 0006   GLUCOSEU NEGATIVE 12/16/2023 0006   GLUCOSEU NEGATIVE 07/28/2022 1048   HGBUR NEGATIVE 12/16/2023 0006   HGBUR trace-lysed 01/05/2010 0919   BILIRUBINUR NEGATIVE 12/16/2023 0006   BILIRUBINUR negative 03/28/2018 1637   KETONESUR 5 (A) 12/16/2023 0006   PROTEINUR 100 (A) 12/16/2023 0006   UROBILINOGEN 1.0 07/28/2022 1048   NITRITE NEGATIVE 12/16/2023 0006   LEUKOCYTESUR TRACE (A) 12/16/2023 0006    Radiological Exams on Admission: CT Renal Stone Study Addendum Date: 12/16/2023 ** ADDENDUM #1 ** 1. Critical value/emergent results were called by telephone at the time of interpretation on 12/16/23 at 11:05 pm to Dr. Griselda, who verbally acknowledged these results. Electronically signed by: Norman Gatlin MD 12/16/2023 11:20 PM EDT RP Workstation: [id number]   Result Date: 12/16/2023 ** ORIGINAL REPORT ** EXAM: CT UROGRAM 12/16/2023 10:49:07 PM TECHNIQUE: CT of the abdomen and pelvis was performed without the administration of intravenous contrast as per CT urogram protocol. Multiplanar reformatted images as well as MIP urogram images are provided for review. Automated exposure control, iterative reconstruction, and/or weight based adjustment of the  mA/kV was utilized to reduce the radiation dose to as low as reasonably achievable. COMPARISON: CT 12/11/2023 CLINICAL HISTORY: Abdominal/flank pain, stone suspected. FINDINGS: LOWER CHEST: No acute abnormality. LIVER: The liver is unremarkable. GALLBLADDER AND BILE DUCTS: Gallbladder is unremarkable. No biliary ductal dilatation. SPLEEN: No acute abnormality. PANCREAS: No acute abnormality. ADRENAL GLANDS: No acute abnormality. KIDNEYS, URETERS AND BLADDER: Nonobstructing right nephrolithiasis. No stones in the ureters. No hydronephrosis. No perinephric or periureteral stranding. Urinary bladder is unremarkable. GI AND BOWEL: Diffuse dilation of the small bowel with 2 adjacent transition points in the mid left hemiabdomen. The initial transition point is on series 6 image 57. The small bowel downstream from the initial transition point is again dilated with a 2nd transition point in an area of mesenteric swirling (series 6 image [id number]). Findings are concerning for closed loop obstruction. The small bowel downstream from the 2nd transition point is decompressed. Stomach demonstrates no acute abnormality. PERITONEUM AND RETROPERITONEUM: Small volume of pelvic ascites. No free air. VASCULATURE: Aorta is normal in caliber. LYMPH NODES: No lymphadenopathy. REPRODUCTIVE ORGANS: No acute abnormality. BONES AND SOFT TISSUES: No acute osseous abnormality. No focal soft tissue abnormality. IMPRESSION: 1. Findings concerning for closed-loop small-bowel obstruction in the mid left hemiabdomen with two adjacent transition points and mesenteric swirling; downstream bowel decompressed. No findings for acute ischemia. Urgent surgical evaluation recommended. 2. Nonobstructing right nephrolithiasis. Electronically signed by: Norman Gatlin MD 12/16/2023 11:12 PM EDT RP Workstation: [id number]      Assessment/Plan    Principal Problem:   SBO (small bowel obstruction) (HCC) Active Problems:   Tobacco abuse   Generalized  abdominal pain   Nausea   Hypercalcemia   History of essential hypertension   Moderate persistent asthma   GAD (generalized anxiety disorder)     #) Small bowel obstruction: In the  setting of 2 days of progressive generalized abdominal discomfort, associated nausea, diminished flatus production, with most recent bowel movement occurring yesterday, presenting CT renal stone study shows evidence of closed loop small bowel obstruction in the with mid left hemiabdomen, with 2 adjacent transition points, in the absence of evidence of bowel perforation or abscess.  Postop adhesions in the setting of history of prior abdominal procedures.  EDP d/w on-call general surgery, Dr. Aron, who will formally consult and is planning to take pt to the OR overnight for exploratory laparotomy.   No evidence of acute MI and no evidence of acutely decompensated heart failure to serve as absolute contraindications to proceeding with invention this evening.  Plan: NPO.  General surgery consult, as above.  Prn IV Dilaudid .  As needed IV Zofran .  Continuous lactated Ringer 's.  Monitor strict I's and O's and daily weights.  Repeat CMP, CBC in the morning.  Check EKG type and screen.  Additionally, there are orders for placement of NGT with confirmatory plain film ordered.  Incentive spirometry.                    #) Hypercalcemia: Presenting labs reflect serum calcium  10.9, but without available albumin value for potential adjustment.  Suspect element of dehydration, without overt pharmacologic contribution, noting 2 days of diminished oral intake in the context of nausea and abdominal discomfort in the setting of presenting solid obstruction. Will initiate gentle IVF's, as above, with repeat calcium  level in the morning, with consideration for further expansion of work-up if no ensuing improvement in serum calcium  level following interval IVF administration.    Plan: LR, as above.  Monitor strict  I's&O's, daily weights.  CMP in the morning.  Check serum Mg and Phos levels.                       #) Essential Hypertension: documented h/o such, with outpatient antihypertensive regimen including amlodipine , losartan .  SBP's in the ED today: 140s mmHg.   Plan: Close monitoring of subsequent BP via routine VS. in the setting of current n.p.o. status, will hold home antihypertensive medications for now.  Monitor strict I's and O's and daily weights.                        #) Moderate persistent asthma: documented history thereof, without clinical e/o to suggest current exacerbation. Outpatient respiratory regimen includes scheduled Advair as well as as needed albuterol  inhaler.   Plan: Check serum magnesium level. Continue home Advair.  As needed albuterol  inhaler.                       #) Generalized anxiety disorder: documented h/o such. On as needed oral Ativan  as outpatient.    Plan: In the setting of current n.p.o. status, and limited availability of IV Ativan , will hold for now, and proceed with prn IV Valium  for anxiety.                     #) Chronic tobacco abuse: Patient conveys that they are a current smoker, continuing to smoke 0.5 ppd.  Plan: Counseled the patient for 3-5 minutes on the importance of complete smoking discontinuation.  Order placed for prn nicotine  patch for use during this hospitalization.          DVT prophylaxis: SCD's   Code Status: Full code Family Communication: none Disposition Plan: Per Rounding Team  Consults called: EDP d/w on-call general surgery, Dr. Aron, who will formally consult and is planning to take pt to the OR overnight for exploratory laparotomy. ;  Admission status: inpatient.    I SPENT GREATER THAN 75  MINUTES IN CLINICAL CARE TIME/MEDICAL DECISION-MAKING IN COMPLETING THIS ADMISSION.     Eva NOVAK Olanda Boughner DO [facility] From 7PM -  7AM   12/17/2023, 2:08 AM

## 2023-12-17 NOTE — Op Note (Signed)
 PRE-OPERATIVE DIAGNOSIS: closed loop small bowel obstruction  POST-OPERATIVE DIAGNOSIS:  Same  PROCEDURE:  Procedure(s): Exploratory laparotomy  SURGEON:  Surgeon(s): Jina Nephew, MD  ANESTHESIA:   general  DRAINS: none   LOCAL MEDICATIONS USED:  NONE  SPECIMEN:  No Specimen  DISPOSITION OF SPECIMEN:  N/A  COUNTS:  YES  DICTATION: .Dragon Dictation  PLAN OF CARE: Admit to inpatient   PATIENT DISPOSITION:  PACU - hemodynamically stable.  FINDINGS:   NGT confirmed in stomach by palpation Closed loop obstruction from adhesion between small bowel and transverse colon omentum.   Murky ascites, no evidence of bowel perforation or ischemia.  EBL: min  PROCEDURE:  Patient was identified in the holding area and taken the operating room where she was placed supine on the operating room table.  Sequential compression devices were placed.  General anesthesia was induced.  Foley catheter was placed.  The patient's abdomen was then prepped and draped in sterile fashion.  A timeout was performed according to the surgical safety checklist.  When all was correct, we continued.    A vertical midline incision was made starting from just above the previous incision.  The subcutaneous tissues divided with the cautery.  The fascia was entered sharply in the midline at the superior aspect of the incision.  The fascial incision was then carried out the length of the skin incision.  The patient previously had interrupted sutures and so these were removed sharply.  The knots were pulled out is much as possible along the length of the incision.  The fascia was elevated with Kocher clamps and then the fascia was opened and the remainder of the incision.  The fascia was elevated and the adhesions were taken off the posterior surface of the abdominal wall.  There was no immediate visibility of any dead bowel, but there was some murky fluid present.  A wound protector was placed.  The small bowel was  then eviscerated starting at the ligament of Treitz going toward the terminal ileum.  All of the proximal jejunum was extremely dilated.  Approximately halfway down to the jejunum an additional loop of jejunum was found laying over the top of this adherent to the transverse colon omentum.  The bowel was twisted on itself also several times and the dilated bowel had created an internal hernia underneath this.  The adhesions between the small intestine and the transverse colon were taken down.  The bowel was then run from ligament of Treitz to the terminal ileum and back.  The distal decompressed bowel looks like it was in good condition and had no evidence of injury.  The portion where the previous adhesion had been had no evidence of serosal injury either.  The bowel was able to lay back in the abdomen nicely.  Upon palpation there were no other gross intra-abdominal pathologies noted.  The abdomen was irrigated.  Four-quadrant inspection was performed following irrigation and there was no evidence of pulling blood or any evidence of succus.  The omentum was pulled down over the top of the bowel and the NG tube placement confirmed to be in the stomach.   The fascia was then closed using running #1 looped PDS suture.  The skin was irrigated and then closed with skin staples.  The wounds were then cleaned, dried, and dressed with a honeycomb dressing.  The patient was then allowed to emerge from anesthesia and taken the PACU in stable condition.

## 2023-12-17 NOTE — Anesthesia Preprocedure Evaluation (Addendum)
 Anesthesia Evaluation  Patient identified by MRN, date of birth, ID band Patient awake    Reviewed: Allergy  & Precautions, NPO status , Patient's Chart, lab work & pertinent test results  History of Anesthesia Complications Negative for: history of anesthetic complications  Airway Mallampati: II  TM Distance: >3 FB Neck ROM: Full    Dental  (+) Dental Advisory Given, Teeth Intact   Pulmonary asthma , Current Smoker and Patient abstained from smoking.   Pulmonary exam normal        Cardiovascular hypertension, Pt. on medications Normal cardiovascular exam     Neuro/Psych  PSYCHIATRIC DISORDERS Anxiety Depression     Balance disorder Hx ACDF  TIA Neuromuscular disease    GI/Hepatic Neg liver ROS,GERD  Medicated and Controlled,, SBO    Endo/Other   Obesity   Renal/GU CRFRenal disease  Female GU complaint     Musculoskeletal negative musculoskeletal ROS (+)    Abdominal   Peds  Hematology negative hematology ROS (+)   Anesthesia Other Findings Chronic back pain  Reproductive/Obstetrics                              Anesthesia Physical Anesthesia Plan  ASA: 3 and emergent  Anesthesia Plan: General   Post-op Pain Management: Ofirmev  IV (intra-op)*   Induction: Intravenous, Rapid sequence and Cricoid pressure planned  PONV Risk Score and Plan: 2 and Treatment may vary due to age or medical condition, Ondansetron , Dexamethasone  and Midazolam   Airway Management Planned: Oral ETT  Additional Equipment: None  Intra-op Plan:   Post-operative Plan: Extubation in OR  Informed Consent: I have reviewed the patients History and Physical, chart, labs and discussed the procedure including the risks, benefits and alternatives for the proposed anesthesia with the patient or authorized representative who has indicated his/her understanding and acceptance.     Dental advisory  given  Plan Discussed with: CRNA and Anesthesiologist  Anesthesia Plan Comments:          Anesthesia Quick Evaluation

## 2023-12-17 NOTE — Consult Note (Signed)
 Reason for Consult:closed loop small bowel obstruction Referring:  Ubaldo High PA-C  [name] Walker is an 63 y.o. female.  HPI:  Patient presents the emergency department with severe abdominal pain that is unremitting.  She has felt poorly since passing a kidney last week.  This was a similar type of pain, but the pain now is much more severe.  The kidney stone pain did improve some, but she never quite recovered her appetite and energy level after that.  Yesterday morning she was unable to eat much from brunch as she felt poorly.  She threw up several times and then yesterday evening started having severe pain that worsened throughout the night.  She does not recall passing any flatus or having a bowel movement since yesterday.  She has gotten also more distended today and has thrown up several more times today.  She has antiemetics from previous medical issues at home and tried these and they were ineffective.  She is unable to get comfortable and the pain medicine barely touch to the amount of pain that she had.  She has had a tubal ligation as well as some type of surgery with a vertical midline incision that occurred when she was pregnant with her son.  She describes it as being related to low amniotic fluid.  Past Medical History:  Diagnosis Date   ACHILLES TENDINITIS    ALLERGIC RHINITIS    Allergy     Anginal pain 04/04/2016   Pt currently feels like she is having muscle spasms and aching in her chest   Anxiety    Asthma    Chronic kidney disease    Depression    Eczema    GERD    History of kidney stones    Hyperlipidemia    HYPERTENSION    Kidney stone    LATERAL EPICONDYLITIS, RIGHT    LOW BACK PAIN    Plantar fascial fibromatosis    Sciatica    Stress    Stroke (HCC)    SUBACROMIAL BURSITIS, RIGHT    Thoracic scoliosis 10/01/2015   TIA (transient ischemic attack)     Past Surgical History:  Procedure Laterality Date   ANTERIOR CERVICAL DECOMP/DISCECTOMY FUSION  N/A 04/07/2016   Procedure: Cervical two-three Anterior cervical decompression/discectomy/fusion;  Surgeon: Rockey Peru, MD;  Location: [facility];  Service: Neurosurgery;  Laterality: N/A;   BACK SURGERY  2015   lower back   CYSTOSCOPY/URETEROSCOPY/HOLMIUM LASER/STENT PLACEMENT Right 11/21/2022   Procedure: CYSTOSCOPY RIGHT RETROGRADE PYELOGRAM RIGHT URETEROSCOPY/HOLMIUM LASER/STENT PLACEMENT;  Surgeon: Nieves Cough, MD;  Location: [facility];  Service: Urology;  Laterality: Right;  60 MINS FOR CASE   EXTRACORPOREAL SHOCK WAVE LITHOTRIPSY Left 09/28/2016   Procedure: LEFT EXTRACORPOREAL SHOCK WAVE LITHOTRIPSY (ESWL);  Surgeon: Nieves Cough, MD;  Location: [facility];  Service: Urology;  Laterality: Left;   EXTRACORPOREAL SHOCK WAVE LITHOTRIPSY Right 04/08/2018   Procedure: EXTRACORPOREAL SHOCK WAVE LITHOTRIPSY (ESWL);  Surgeon: Cam Morene ORN, MD;  Location: [facility];  Service: Urology;  Laterality: Right;   KIDNEY SURGERY     LITHOTRIPSY  05/2016   TUBAL LIGATION      Family History  Problem Relation Age of Onset   Diabetes Mother    Dementia Mother    Alzheimer's disease Mother    Colon cancer Father        dx in his 32s   Prostate cancer Father    Colon cancer Sister        dx in her 70s  Breast cancer Maternal Aunt        50's   Esophageal cancer Neg Hx    Stomach cancer Neg Hx    Crohn's disease Neg Hx    Rectal cancer Neg Hx     Social History:  reports that she has been smoking cigarettes. She has a 10 pack-year smoking history. She has never been exposed to tobacco smoke. She has never used smokeless tobacco. She reports that she does not drink alcohol and does not use drugs.  Allergies:  Allergies  Allergen Reactions   Flexeril  [Cyclobenzaprine ] Swelling    Tongue swells   Lisinopril  Other (See Comments)    Possible tongue swelling     Other Palpitations    ALL MUSCLE RELAXERS-PER PATIENT   Robaxin  [Methocarbamol ] Other (See Comments)    Tongue swelling   Contrast  Media [Iodinated Contrast Media] Itching   Iodine Itching   Gabapentin  Other (See Comments)   Pregabalin Anxiety and Swelling    Anxiety attacks    Medications:  acetaminophen -codeine  (TYLENOL  #4) 300-60 MG tablet albuterol  (VENTOLIN  HFA) 108 (90 Base) MCG/ACT inhaler alendronate  (FOSAMAX ) 70 MG tablet amLODipine  (NORVASC ) 5 MG tablet aspirin  EC 81 MG tablet atorvastatin  (LIPITOR) 20 MG tablet (Expired) Cholecalciferol (THERA-D 2000) 50 MCG (2000 UT) TABS citalopram  (CELEXA ) 40 MG tablet fluticasone  (FLONASE ) 50 MCG/ACT nasal spray fluticasone  (FLONASE ) 50 MCG/ACT nasal spray fluticasone -salmeterol (ADVAIR DISKUS) 250-50 MCG/ACT AEPB levocetirizine (XYZAL ) 5 MG tablet lidocaine  (LIDODERM ) 5 % LORazepam  (ATIVAN ) 1 MG tablet losartan  (COZAAR ) 50 MG tablet ondansetron  (ZOFRAN ) 4 MG tablet pantoprazole  (PROTONIX ) 40 MG tablet traZODone  (DESYREL ) 100 MG tablet  Results for orders placed or performed during the hospital encounter of 12/16/23 (from the past 48 hours)  Urinalysis, Routine w reflex microscopic -Urine, Clean Catch     Status: Abnormal   Collection Time: 12/16/23 12:06 AM  Result Value Ref Range   Color, Urine AMBER (A) YELLOW    Comment: BIOCHEMICALS MAY BE AFFECTED BY COLOR   APPearance HAZY (A) CLEAR   Specific Gravity, Urine 1.019 1.005 - 1.030   pH 7.0 5.0 - 8.0   Glucose, UA NEGATIVE NEGATIVE mg/dL   Hgb urine dipstick NEGATIVE NEGATIVE   Bilirubin Urine NEGATIVE NEGATIVE   Ketones, ur 5 (A) NEGATIVE mg/dL   Protein, ur 899 (A) NEGATIVE mg/dL   Nitrite NEGATIVE NEGATIVE   Leukocytes,Ua TRACE (A) NEGATIVE   RBC / HPF 0-5 0 - 5 RBC/hpf   WBC, UA 6-10 0 - 5 WBC/hpf   Bacteria, UA RARE (A) NONE SEEN   Squamous Epithelial / HPF 6-10 0 - 5 /HPF   Mucus PRESENT    Hyaline Casts, UA PRESENT     Comment: Performed at [facility], [address] [place], [place] [address]  Basic metabolic panel     Status: Abnormal   Collection Time: 12/16/23  10:32 PM  Result Value Ref Range   Sodium 144 135 - 145 mmol/L   Potassium 3.6 3.5 - 5.1 mmol/L   Chloride 104 98 - 111 mmol/L   CO2 22 22 - 32 mmol/L   Glucose, Bld 171 (H) 70 - 99 mg/dL    Comment: Glucose reference range applies only to samples taken after fasting for at least 8 hours.   BUN 22 8 - 23 mg/dL   Creatinine, Ser 8.90 (H) 0.44 - 1.00 mg/dL   Calcium  10.9 (H) 8.9 - 10.3 mg/dL   GFR, Estimated 57 (L) >60 mL/min    Comment: (NOTE) Calculated using  the CKD-EPI Creatinine Equation (2021)    Anion gap 18 (H) 5 - 15    Comment: Performed at [facility], [address], [place], [place] [address]  CBC with Differential     Status: Abnormal   Collection Time: 12/16/23 10:32 PM  Result Value Ref Range   WBC 6.9 4.0 - 10.5 K/uL   RBC 5.77 (H) 3.87 - 5.11 MIL/uL   Hemoglobin 16.1 (H) 12.0 - 15.0 g/dL   HCT 50.0 (H) 63.9 - 53.9 %   MCV 86.5 80.0 - 100.0 fL   MCH 27.9 26.0 - 34.0 pg   MCHC 32.3 30.0 - 36.0 g/dL   RDW 85.9 88.4 - 84.4 %   Platelets 162 150 - 400 K/uL   nRBC 0.0 0.0 - 0.2 %   Neutrophils Relative % 70 %   Neutro Abs 4.8 1.7 - 7.7 K/uL   Lymphocytes Relative 23 %   Lymphs Abs 1.6 0.7 - 4.0 K/uL   Monocytes Relative 7 %   Monocytes Absolute 0.5 0.1 - 1.0 K/uL   Eosinophils Relative 0 %   Eosinophils Absolute 0.0 0.0 - 0.5 K/uL   Basophils Relative 0 %   Basophils Absolute 0.0 0.0 - 0.1 K/uL   Immature Granulocytes 0 %   Abs Immature Granulocytes 0.02 0.00 - 0.07 K/uL    Comment: Performed at [facility], [address] [place], [place] [address]    CT Renal Stone Study Addendum Date: 12/16/2023 MELVINIA #1 ^^^^^ 1. Critical value/emergent results were called by telephone at the time of interpretation on 12/16/23 at 11:05 pm to Dr. Griselda, who verbally acknowledged these results. Electronically signed by: Norman Gatlin MD 12/16/2023 11:20 PM EDT RP Workstation: [id number]   Result Date:  12/16/2023 JOANETTE REPORT ^^^^^^ EXAM: CT UROGRAM 12/16/2023 10:49:07 PM TECHNIQUE: CT of the abdomen and pelvis was performed without the administration of intravenous contrast as per CT urogram protocol. Multiplanar reformatted images as well as MIP urogram images are provided for review. Automated exposure control, iterative reconstruction, and/or weight based adjustment of the mA/kV was utilized to reduce the radiation dose to as low as reasonably achievable. COMPARISON: CT 12/11/2023 CLINICAL HISTORY: Abdominal/flank pain, stone suspected. FINDINGS: LOWER CHEST: No acute abnormality. LIVER: The liver is unremarkable. GALLBLADDER AND BILE DUCTS: Gallbladder is unremarkable. No biliary ductal dilatation. SPLEEN: No acute abnormality. PANCREAS: No acute abnormality. ADRENAL GLANDS: No acute abnormality. KIDNEYS, URETERS AND BLADDER: Nonobstructing right nephrolithiasis. No stones in the ureters. No hydronephrosis. No perinephric or periureteral stranding. Urinary bladder is unremarkable. GI AND BOWEL: Diffuse dilation of the small bowel with 2 adjacent transition points in the mid left hemiabdomen. The initial transition point is on series 6 image 57. The small bowel downstream from the initial transition point is again dilated with a 2nd transition point in an area of mesenteric swirling (series 6 image [id number]). Findings are concerning for closed loop obstruction. The small bowel downstream from the 2nd transition point is decompressed. Stomach demonstrates no acute abnormality. PERITONEUM AND RETROPERITONEUM: Small volume of pelvic ascites. No free air. VASCULATURE: Aorta is normal in caliber. LYMPH NODES: No lymphadenopathy. REPRODUCTIVE ORGANS: No acute abnormality. BONES AND SOFT TISSUES: No acute osseous abnormality. No focal soft tissue abnormality. IMPRESSION: 1. Findings concerning for closed-loop small-bowel obstruction in the mid left hemiabdomen with two adjacent transition points and mesenteric  swirling; downstream bowel decompressed. No findings for acute ischemia. Urgent surgical evaluation recommended. 2. Nonobstructing right nephrolithiasis. Electronically signed by: Norman Gatlin MD  12/16/2023 11:12 PM EDT RP Workstation: [id number]    Review of Systems  Constitutional:  Positive for appetite change.  HENT: Negative.    Eyes: Negative.   Respiratory: Negative.    Gastrointestinal:  Positive for abdominal distention, abdominal pain, nausea and vomiting.  Endocrine: Negative.   Genitourinary:  Positive for decreased urine volume.       Passed kidney stone <1 week ago  Musculoskeletal:  Positive for back pain (chronic).  Skin: Negative.   Allergic/Immunologic: Negative.   Neurological: Negative.   Hematological: Negative.   Psychiatric/Behavioral: Negative.    All other systems reviewed and are negative.  Blood pressure (!) 148/107, pulse 100, temperature 97.9 F (36.6 C), temperature source Oral, resp. rate (!) 21, last menstrual period 08/22/2010, SpO2 100%. Physical Exam Vitals reviewed.  Constitutional:      General: She is in acute distress.     Appearance: Normal appearance. She is ill-appearing. She is not toxic-appearing or diaphoretic.     Comments: Rubbing abdomen  HENT:     Head: Normocephalic and atraumatic.     Right Ear: External ear normal.     Left Ear: External ear normal.     Nose: Nose normal.     Mouth/Throat:     Mouth: Mucous membranes are dry.  Eyes:     General: No scleral icterus.       Right eye: No discharge.        Left eye: No discharge.     Conjunctiva/sclera: Conjunctivae normal.     Pupils: Pupils are equal, round, and reactive to light.  Cardiovascular:     Rate and Rhythm: Regular rhythm. Tachycardia present.     Pulses: Normal pulses.     Heart sounds: Normal heart sounds. No murmur heard. Pulmonary:     Effort: Pulmonary effort is normal. Tachypnea present.     Comments: Mild tachypnea and sl labored after some  conversation Abdominal:     General: There is distension.     Palpations: Abdomen is soft. There is no mass.     Tenderness: There is abdominal tenderness. There is guarding. There is no rebound.     Hernia: No hernia is present.  Musculoskeletal:        General: No swelling, tenderness, deformity or signs of injury.     Cervical back: Normal range of motion and neck supple. No rigidity.  Skin:    General: Skin is warm and dry.     Capillary Refill: Capillary refill takes 2 to 3 seconds.     Coloration: Skin is not jaundiced or pale.     Findings: No bruising or erythema.  Neurological:     General: No focal deficit present.     Mental Status: She is alert and oriented to person, place, and time.     Coordination: Coordination normal.  Psychiatric:        Mood and Affect: Mood normal.        Behavior: Behavior normal.        Thought Content: Thought content normal.     Assessment/Plan: Closed loop small bowel obstruction Dehydration H/o kidney stones  NPO IV fluids NGT Ex lap/lysis of adhesions/possible small bowel resection.   Discussed surgery with patient and daughter.  Unclear given appearance of scan if she would have any ability to detorse loop of bowel.  Would not do SB protocol in setting of closed loop obstruction.     Jina LITTIE Nephew, MD, FACS, [facility] Surgical Oncology, General Surgery,  Trauma and Critical [facility] Surgery, [place] [phone] for weekday/non holidays Check [email or web address] for coverage night/weekend/holidays

## 2023-12-17 NOTE — Transfer of Care (Signed)
 Immediate Anesthesia Transfer of Care Note  Patient: [name] Walker  Procedure(s) Performed: LAPAROTOMY, EXPLORATORY  Patient Location: PACU  Anesthesia Type:General  Level of Consciousness: drowsy and patient cooperative  Airway & Oxygen Therapy: Patient Spontanous Breathing and Patient connected to face mask oxygen  Post-op Assessment: Report given to RN and Post -op Vital signs reviewed and stable  Post vital signs: Reviewed and stable  Last Vitals:  Vitals Value Taken Time  BP 165/151 12/17/23 03:48  Temp    Pulse 78 12/17/23 03:53  Resp 17 12/17/23 03:53  SpO2 100 % 12/17/23 03:53  Vitals shown include unfiled device data. BP recheck 156/102  Last Pain:  Vitals:   12/17/23 0010  TempSrc:   PainSc: 8          Complications: No notable events documented.

## 2023-12-17 NOTE — Progress Notes (Signed)
 * Day of Surgery *  Subjective: CC: Generalized abdominal pain improved with PCA but not controlled. Reports she is on Tylenol  #4 at baseline. No nausea. NGT in place. No flatus or BM. Foley in place.   Objective: Vital signs in last 24 hours: Temp:  [97 F (36.1 C)-98.9 F (37.2 C)] 98.9 F (37.2 C) (10/20 0605) Pulse Rate:  [72-100] 74 (10/20 0854) Resp:  [11-21] 16 (10/20 1152) BP: (132-175)/(90-107) 135/90 (10/20 0854) SpO2:  [90 %-100 %] 96 % (10/20 1152) Last BM Date : 12/15/23  Intake/Output from previous day: 10/19 0701 - 10/20 0700 In: 900 [I.V.:800; IV Piggyback:100] Out: 100 [Urine:50; Blood:50] Intake/Output this shift: Total I/O In: 0  Out: 300 [Urine:300]  PE: Gen:  Alert, NAD, pleasant Abd: Soft, mild distension, generalized ttp without rigidity or guarding. Midline wound with honeycomb dressing in place, cdi  Lab Results:  Recent Labs    12/16/23 2232 12/17/23 0533  WBC 6.9 11.1*  HGB 16.1* 13.7  HCT 49.9* 41.9  PLT 162 142*   BMET Recent Labs    12/16/23 2232 12/17/23 0533  NA 144 143  K 3.6 3.5  CL 104 110  CO2 22 21*  GLUCOSE 171* 142*  BUN 22 21  CREATININE 1.09* 0.83  CALCIUM  10.9* 8.4*   PT/INR No results for input(s): LABPROT, INR in the last 72 hours. CMP     Component Value Date/Time   NA 143 12/17/2023 0533   K 3.5 12/17/2023 0533   CL 110 12/17/2023 0533   CO2 21 (L) 12/17/2023 0533   GLUCOSE 142 (H) 12/17/2023 0533   BUN 21 12/17/2023 0533   CREATININE 0.83 12/17/2023 0533   CREATININE 0.58 09/23/2019 1128   CALCIUM  8.4 (L) 12/17/2023 0533   PROT 6.4 (L) 12/17/2023 0533   ALBUMIN 4.0 12/17/2023 0533   AST 23 12/17/2023 0533   ALT 14 12/17/2023 0533   ALKPHOS 71 12/17/2023 0533   BILITOT 0.6 12/17/2023 0533   GFRNONAA >60 12/17/2023 0533   GFRAA >60 09/30/2019 1202   Lipase     Component Value Date/Time   LIPASE 21 12/11/2023 1652    Studies/Results: DG Abd 1 View Result Date: 12/17/2023 EXAM: 1  VIEW XRAY OF THE ABDOMEN 12/17/2023 at 4:30 am COMPARISON: CT abdomen and pelvis 12/16/2023 CLINICAL HISTORY: evaluate NG tube placement FINDINGS: LINES, TUBES AND DEVICES: There is an enteric tube with the tip in the side port below the GE junction. The tip projects over the expected location of the gastric antrum. Midline skin staples are identified. BOWEL: Multiple dilated loops of small bowel identified within the central abdomen and left upper quadrant of the abdomen. SOFT TISSUES: No opaque urinary calculi. BONES: No acute osseous abnormality. IMPRESSION: 1. Enteric tube tip projects over the expected location of the gastric antrum. 2. Multiple dilated loops of small bowel within the left upper quadrant and central abdomen which may reflect postoperative ileus versus obstruction. Electronically signed by: Waddell Calk MD 12/17/2023 06:16 AM EDT RP Workstation: [id number]   CT Renal Stone Study Addendum Date: 12/16/2023 1. Critical value/emergent results were called by telephone at the time of interpretation on 12/16/23 at 11:05 pm to Dr. Griselda, who verbally acknowledged these results. Electronically signed by: Norman Gatlin MD 12/16/2023 11:20 PM EDT RP Workstation: [id number]   Result Date: 12/16/2023 EXAM: CT UROGRAM 12/16/2023 10:49:07 PM TECHNIQUE: CT of the abdomen and pelvis was performed without the administration of intravenous contrast as per CT urogram protocol. Multiplanar reformatted images  as well as MIP urogram images are provided for review. Automated exposure control, iterative reconstruction, and/or weight based adjustment of the mA/kV was utilized to reduce the radiation dose to as low as reasonably achievable. COMPARISON: CT 12/11/2023 CLINICAL HISTORY: Abdominal/flank pain, stone suspected. FINDINGS: LOWER CHEST: No acute abnormality. LIVER: The liver is unremarkable. GALLBLADDER AND BILE DUCTS: Gallbladder is unremarkable. No biliary ductal dilatation. SPLEEN: No acute abnormality.  PANCREAS: No acute abnormality. ADRENAL GLANDS: No acute abnormality. KIDNEYS, URETERS AND BLADDER: Nonobstructing right nephrolithiasis. No stones in the ureters. No hydronephrosis. No perinephric or periureteral stranding. Urinary bladder is unremarkable. GI AND BOWEL: Diffuse dilation of the small bowel with 2 adjacent transition points in the mid left hemiabdomen. The initial transition point is on series 6 image 57. The small bowel downstream from the initial transition point is again dilated with a 2nd transition point in an area of mesenteric swirling (series 6 image 60-48). Findings are concerning for closed loop obstruction. The small bowel downstream from the 2nd transition point is decompressed. Stomach demonstrates no acute abnormality. PERITONEUM AND RETROPERITONEUM: Small volume of pelvic ascites. No free air. VASCULATURE: Aorta is normal in caliber. LYMPH NODES: No lymphadenopathy. REPRODUCTIVE ORGANS: No acute abnormality. BONES AND SOFT TISSUES: No acute osseous abnormality. No focal soft tissue abnormality. IMPRESSION: 1. Findings concerning for closed-loop small-bowel obstruction in the mid left hemiabdomen with two adjacent transition points and mesenteric swirling; downstream bowel decompressed. No findings for acute ischemia. Urgent surgical evaluation recommended. 2. Nonobstructing right nephrolithiasis. Electronically signed by: Norman Gatlin MD 12/16/2023 11:12 PM EDT RP Workstation: [id number]    Anti-infectives: Anti-infectives (From admission, onward)    Start     Dose/Rate Route Frequency Ordered Stop   12/17/23 0115  ceFAZolin  (ANCEF ) IVPB 2g/100 mL premix        2 g 200 mL/hr over 30 Minutes Intravenous  Once 12/17/23 0107 12/17/23 0235        Assessment/Plan POD 0 s/p ex lap, loa by Dr. Aron on 12/17/23 for closed loop small bowel obstruction  - Cont NPO, NGT to LIWS - Cont PCA. Add IV Tylenol  and Robaxin  - Mobilize - Pulm toilet   FEN - NPO, NGT to LIWS, IVF  per TRH  VTE - SCDs, okay for chem ppx from a general surgery standpoint ID - Ancef  peri-op. None currently Foley - In place, continue foley, plan to d/c POD 1    LOS: 0 days    Ozell CHRISTELLA Shaper, [facility] Surgery 12/17/2023, 12:46 PM Please see Amion for pager number during day hours 7:00am-4:30pm

## 2023-12-18 ENCOUNTER — Inpatient Hospital Stay (HOSPITAL_COMMUNITY)

## 2023-12-18 ENCOUNTER — Encounter (HOSPITAL_COMMUNITY): Payer: Self-pay | Admitting: General Surgery

## 2023-12-18 DIAGNOSIS — R918 Other nonspecific abnormal finding of lung field: Secondary | ICD-10-CM | POA: Diagnosis not present

## 2023-12-18 DIAGNOSIS — R0989 Other specified symptoms and signs involving the circulatory and respiratory systems: Secondary | ICD-10-CM | POA: Diagnosis not present

## 2023-12-18 DIAGNOSIS — K56609 Unspecified intestinal obstruction, unspecified as to partial versus complete obstruction: Secondary | ICD-10-CM | POA: Diagnosis not present

## 2023-12-18 DIAGNOSIS — J90 Pleural effusion, not elsewhere classified: Secondary | ICD-10-CM | POA: Diagnosis not present

## 2023-12-18 DIAGNOSIS — R079 Chest pain, unspecified: Secondary | ICD-10-CM | POA: Diagnosis not present

## 2023-12-18 LAB — CBC
HCT: 38.9 % (ref 36.0–46.0)
Hemoglobin: 11.9 g/dL — ABNORMAL LOW (ref 12.0–15.0)
MCH: 27.9 pg (ref 26.0–34.0)
MCHC: 30.6 g/dL (ref 30.0–36.0)
MCV: 91.3 fL (ref 80.0–100.0)
Platelets: 111 K/uL — ABNORMAL LOW (ref 150–400)
RBC: 4.26 MIL/uL (ref 3.87–5.11)
RDW: 14.7 % (ref 11.5–15.5)
WBC: 5.7 K/uL (ref 4.0–10.5)
nRBC: 0 % (ref 0.0–0.2)

## 2023-12-18 LAB — BASIC METABOLIC PANEL WITH GFR
Anion gap: 8 (ref 5–15)
BUN: 19 mg/dL (ref 8–23)
CO2: 24 mmol/L (ref 22–32)
Calcium: 8.7 mg/dL — ABNORMAL LOW (ref 8.9–10.3)
Chloride: 110 mmol/L (ref 98–111)
Creatinine, Ser: 0.6 mg/dL (ref 0.44–1.00)
GFR, Estimated: >60 mL/min (ref >60)
Glucose, Bld: 99 mg/dL (ref 70–99)
Potassium: 3.4 mmol/L — ABNORMAL LOW (ref 3.5–5.1)
Sodium: 143 mmol/L (ref 135–145)

## 2023-12-18 LAB — PHOSPHORUS: Phosphorus: 2.4 mg/dL — ABNORMAL LOW (ref 2.5–4.6)

## 2023-12-18 LAB — TROPONIN T, HIGH SENSITIVITY
Troponin T High Sensitivity: <15 ng/L (ref 0–19)
Troponin T High Sensitivity: <15 ng/L (ref 0–19)

## 2023-12-18 LAB — MAGNESIUM: Magnesium: 2.5 mg/dL — ABNORMAL HIGH (ref 1.7–2.4)

## 2023-12-18 MED ORDER — POTASSIUM CHLORIDE 10 MEQ/100ML IV SOLN
10.0000 meq | INTRAVENOUS | Status: AC
  Administered 2023-12-18 (×3): 10 meq via INTRAVENOUS
  Filled 2023-12-18 (×2): qty 100

## 2023-12-18 MED ORDER — PHENOL 1.4 % MT LIQD
1.0000 | OROMUCOSAL | Status: DC | PRN
  Filled 2023-12-18: qty 177

## 2023-12-18 MED ORDER — ARFORMOTEROL TARTRATE 15 MCG/2ML IN NEBU
15.0000 ug | INHALATION_SOLUTION | Freq: Two times a day (BID) | RESPIRATORY_TRACT | Status: DC
  Administered 2023-12-18 – 2023-12-28 (×20): 15 ug via RESPIRATORY_TRACT
  Filled 2023-12-18 (×20): qty 2

## 2023-12-18 MED ORDER — PANTOPRAZOLE SODIUM 40 MG IV SOLR
40.0000 mg | Freq: Two times a day (BID) | INTRAVENOUS | Status: DC
  Administered 2023-12-18 – 2023-12-21 (×8): 40 mg via INTRAVENOUS
  Filled 2023-12-18 (×8): qty 10

## 2023-12-18 MED ORDER — POTASSIUM PHOSPHATES 15 MMOLE/5ML IV SOLN
15.0000 mmol | Freq: Once | INTRAVENOUS | Status: AC
  Administered 2023-12-18: 15 mmol via INTRAVENOUS
  Filled 2023-12-18: qty 5

## 2023-12-18 MED ORDER — IPRATROPIUM-ALBUTEROL 0.5-2.5 (3) MG/3ML IN SOLN
3.0000 mL | Freq: Three times a day (TID) | RESPIRATORY_TRACT | Status: DC
  Administered 2023-12-18 – 2023-12-19 (×3): 3 mL via RESPIRATORY_TRACT
  Filled 2023-12-18 (×3): qty 3

## 2023-12-18 MED ORDER — POTASSIUM CHLORIDE 10 MEQ/100ML IV SOLN
INTRAVENOUS | Status: AC
  Administered 2023-12-18: 10 meq
  Filled 2023-12-18: qty 100

## 2023-12-18 MED ORDER — BUDESONIDE 0.25 MG/2ML IN SUSP
0.2500 mg | Freq: Two times a day (BID) | RESPIRATORY_TRACT | Status: DC
  Administered 2023-12-18 – 2023-12-28 (×20): 0.25 mg via RESPIRATORY_TRACT
  Filled 2023-12-18 (×20): qty 2

## 2023-12-18 MED ORDER — LACTATED RINGERS IV SOLN: INTRAVENOUS | Status: AC

## 2023-12-18 NOTE — Progress Notes (Addendum)
 PROGRESS NOTE    [name] Walker  FMW:[id number] DOB: 11-03-1960 DOA: 12/16/2023 PCP: Norleen Lynwood ORN, MD   Brief Narrative: 63 year old with past medical history signal for persistent asthma, GAD admitted 10/19 with a small bowel obstruction, initially presented complaining of abdominal pain.  CT omentum pelvis finding concerning for closed-loop small bowel obstruction in the mid hemiabdomen with 2 adjacent transition point and mesenteric swirling . Patient underwent exploratory laparotomy with LOA on 10/20 close loop bowel obstruction.     Assessment & Plan:   Principal Problem:   Small bowel obstruction (HCC) Active Problems:   Tobacco abuse   Generalized abdominal pain   Nausea   Hypercalcemia   History of essential hypertension   Moderate persistent asthma   GAD (generalized anxiety disorder)   1-Closed-loop bowel obstruction: - Patient presented with abdominal pain, nausea vomiting CT finding concerning for closed-loop bowel obstruction and to transition point. - General Surgery was consulted and patient underwent exploratory laparotomy with LOA on 10/20. - Continue n.p.o. NG tube -Continue with IV fluids .  -replete electrolytes.   Hypercalcemia: - In the setting of dehydration resolved  Hypophosphatemia: Replete  Hypokalemia: Replace IV  Chest Pain:  Report burning pain.  Continue with IV Protonix .  EKG has some T wave inversion on V 4--6 suspect related to hypokalemia.  Cycle troponin.  Chest pain resolved.   Acute Hypoxic resp failure Post sx Continue 3 L oxygen Follow up chest x ray   Run os Asymptomatic VT; Replete electrolytes.   Moderate persistent asthma; Change  Breo Ellipta---to nebulizer form.   Hypertension: - Holding home blood pressure medication Norvasc  and losartan  in the setting of n.p.o. - As needed hydralazine  Leukocytosis: - In the setting of SBO - Resolved, follow trend  GAD:  - On Ativan  1 mg twice daily as needed at  home. - Resume Celexa  and trazodone  when tolerating orals.  Tobacco use disorder, -counseling.   Thrombocytopenia; monitor.   Estimated body mass index is 30.08 kg/m as calculated from the following:   Height as of 12/07/23: 5' (1.524 m).   Weight as of 12/07/23: 69.9 kg.   DVT prophylaxis: Lovenox Code Status: Full code Family Communication: Care discussed with patient. And son who was at bedside.  Disposition Plan:  Status is: Inpatient Remains inpatient appropriate because: management of SBP    Consultants:  Surgery   Procedures:  Exp lap  Antimicrobials:    Subjective: She is alert, report abdominal pain. No passing gas. NG tube in placed.  Still requiring PCA pump for pain management.  She had an episode of chest pain in the afternoon, burning sensation.   Objective: Vitals:   12/18/23 0005 12/18/23 0217 12/18/23 0400 12/18/23 0610  BP:  121/86  136/79  Pulse:  73  87  Resp: 19 16 19 16   Temp:  97.8 F (36.6 C)  97.7 F (36.5 C)  TempSrc:  Oral    SpO2: 96% 91% 90% 90%    Intake/Output Summary (Last 24 hours) at 12/18/2023 0727 Last data filed at 12/18/2023 9386 Gross per 24 hour  Intake 3016.86 ml  Output 1650 ml  Net 1366.86 ml   There were no vitals filed for this visit.  Examination:  General exam: Appears calm and comfortable  Respiratory system: Clear to auscultation. Respiratory effort normal. Cardiovascular system: S1 & S2 heard, RRR. No JVD, murmurs, rubs, gallops or clicks. No pedal edema. Gastrointestinal system: midline with honeycomb dressing. Soft, mild tender.  Central nervous system:  Alert and oriented Extremities: Symmetric 5 x 5 power.   Data Reviewed: I have personally reviewed following labs and imaging studies  CBC: Recent Labs  Lab 12/11/23 1652 12/16/23 2232 12/17/23 0533 12/18/23 0439  WBC 10.6* 6.9 11.1* 5.7  NEUTROABS 6.7 4.8 9.4*  --   HGB 14.5 16.1* 13.7 11.9*  HCT 45.5 49.9* 41.9 38.9  MCV 87.8 86.5  89.3 91.3  PLT 120* 162 142* 111*   Basic Metabolic Panel: Recent Labs  Lab 12/11/23 1652 12/16/23 2232 12/17/23 0533 12/17/23 0827 12/18/23 0439  NA 143 144 143  --  143  K 4.1 3.6 3.5  --  3.4*  CL 108 104 110  --  110  CO2 21* 22 21*  --  24  GLUCOSE 97 171* 142*  --  99  BUN 17 22 21   --  19  CREATININE 1.31* 1.09* 0.83  --  0.60  CALCIUM  10.3 10.9* 8.4*  --  8.7*  MG  --   --  1.7  --  2.5*  PHOS  --   --   --  3.9 2.4*   GFR: Estimated Creatinine Clearance: 62.8 mL/min (by C-G formula based on SCr of 0.6 mg/dL). Liver Function Tests: Recent Labs  Lab 12/11/23 1652 12/17/23 0533  AST 29 23  ALT 21 14  ALKPHOS 87 71  BILITOT 0.8 0.6  PROT 7.2 6.4*  ALBUMIN 4.3 4.0   Recent Labs  Lab 12/11/23 1652  LIPASE 21   No results for input(s): AMMONIA in the last 168 hours. Coagulation Profile: No results for input(s): INR, PROTIME in the last 168 hours. Cardiac Enzymes: No results for input(s): CKTOTAL, CKMB, CKMBINDEX, TROPONINI in the last 168 hours. BNP (last 3 results) No results for input(s): PROBNP in the last 8760 hours. HbA1C: No results for input(s): HGBA1C in the last 72 hours. CBG: No results for input(s): GLUCAP in the last 168 hours. Lipid Profile: No results for input(s): CHOL, HDL, LDLCALC, TRIG, CHOLHDL, LDLDIRECT in the last 72 hours. Thyroid  Function Tests: No results for input(s): TSH, T4TOTAL, FREET4, T3FREE, THYROIDAB in the last 72 hours. Anemia Panel: No results for input(s): VITAMINB12, FOLATE, FERRITIN, TIBC, IRON, RETICCTPCT in the last 72 hours. Sepsis Labs: No results for input(s): PROCALCITON, LATICACIDVEN in the last 168 hours.  No results found for this or any previous visit (from the past 240 hours).       Radiology Studies:       Scheduled Meds:  Chlorhexidine  Gluconate Cloth  6 each Topical Daily   enoxaparin (LOVENOX) injection  40 mg Subcutaneous QHS    fluticasone  furoate-vilanterol  1 puff Inhalation Daily   HYDROmorphone    Intravenous Q4H   influenza vac split trivalent PF  0.5 mL Intramuscular Tomorrow-1000   pneumococcal 20-valent conjugate vaccine  0.5 mL Intramuscular Tomorrow-1000   Continuous Infusions:   LOS: 1 day    Time spent: 35 Minutes    Vara Mairena A Soriyah Osberg, MD [facility]   If 7PM-7AM, please contact night-coverage [email or web address]  12/18/2023, 7:27 AM

## 2023-12-18 NOTE — Progress Notes (Signed)
 1 Day Post-Op  Subjective: CC: Pain.  No flatus or bowel movements.  Objective: Vital signs in last 24 hours: Temp:  [97.7 F (36.5 C)-97.8 F (36.6 C)] 97.7 F (36.5 C) (10/21 0610) Pulse Rate:  [63-87] 87 (10/21 0610) Resp:  [15-19] 16 (10/21 0610) BP: (113-136)/(79-86) 136/79 (10/21 0610) SpO2:  [90 %-98 %] 90 % (10/21 0610) Last BM Date : 12/15/23  Intake/Output from previous day: 10/20 0701 - 10/21 0700 In: 3016.9 [I.V.:2687; IV Piggyback:329.9] Out: 1650 [Urine:1450; Emesis/NG output:200] Intake/Output this shift: No intake/output data recorded.  PE: Gen:  Alert, NAD, pleasant Abd: Soft, mild distension, generalized ttp without rigidity or guarding. Midline wound with honeycomb dressing in place, cdi  Lab Results:  Recent Labs    12/17/23 0533 12/18/23 0439  WBC 11.1* 5.7  HGB 13.7 11.9*  HCT 41.9 38.9  PLT 142* 111*   BMET Recent Labs    12/17/23 0533 12/18/23 0439  NA 143 143  K 3.5 3.4*  CL 110 110  CO2 21* 24  GLUCOSE 142* 99  BUN 21 19  CREATININE 0.83 0.60  CALCIUM  8.4* 8.7*   PT/INR No results for input(s): LABPROT, INR in the last 72 hours. CMP     Component Value Date/Time   NA 143 12/18/2023 0439   K 3.4 (L) 12/18/2023 0439   CL 110 12/18/2023 0439   CO2 24 12/18/2023 0439   GLUCOSE 99 12/18/2023 0439   BUN 19 12/18/2023 0439   CREATININE 0.60 12/18/2023 0439   CREATININE 0.58 09/23/2019 1128   CALCIUM  8.7 (L) 12/18/2023 0439   PROT 6.4 (L) 12/17/2023 0533   ALBUMIN 4.0 12/17/2023 0533   AST 23 12/17/2023 0533   ALT 14 12/17/2023 0533   ALKPHOS 71 12/17/2023 0533   BILITOT 0.6 12/17/2023 0533   GFRNONAA >60 12/18/2023 0439   GFRAA >60 09/30/2019 1202   Lipase     Component Value Date/Time   LIPASE 21 12/11/2023 1652    Studies/Results: DG Abd 1 View Result Date: 12/17/2023 EXAM: 1 VIEW XRAY OF THE ABDOMEN 12/17/2023 at 4:30 am COMPARISON: CT abdomen and pelvis 12/16/2023 CLINICAL HISTORY: evaluate NG tube  placement FINDINGS: LINES, TUBES AND DEVICES: There is an enteric tube with the tip in the side port below the GE junction. The tip projects over the expected location of the gastric antrum. Midline skin staples are identified. BOWEL: Multiple dilated loops of small bowel identified within the central abdomen and left upper quadrant of the abdomen. SOFT TISSUES: No opaque urinary calculi. BONES: No acute osseous abnormality. IMPRESSION: 1. Enteric tube tip projects over the expected location of the gastric antrum. 2. Multiple dilated loops of small bowel within the left upper quadrant and central abdomen which may reflect postoperative ileus versus obstruction. Electronically signed by: Waddell Calk MD 12/17/2023 06:16 AM EDT RP Workstation: [id number]   CT Renal Stone Study Addendum Date: 12/16/2023 1. Critical value/emergent results were called by telephone at the time of interpretation on 12/16/23 at 11:05 pm to Dr. Griselda, who verbally acknowledged these results. Electronically signed by: Norman Gatlin MD 12/16/2023 11:20 PM EDT RP Workstation: [id number]   Result Date: 12/16/2023 EXAM: CT UROGRAM 12/16/2023 10:49:07 PM TECHNIQUE: CT of the abdomen and pelvis was performed without the administration of intravenous contrast as per CT urogram protocol. Multiplanar reformatted images as well as MIP urogram images are provided for review. Automated exposure control, iterative reconstruction, and/or weight based adjustment of the mA/kV was utilized to reduce the radiation dose  to as low as reasonably achievable. COMPARISON: CT 12/11/2023 CLINICAL HISTORY: Abdominal/flank pain, stone suspected. FINDINGS: LOWER CHEST: No acute abnormality. LIVER: The liver is unremarkable. GALLBLADDER AND BILE DUCTS: Gallbladder is unremarkable. No biliary ductal dilatation. SPLEEN: No acute abnormality. PANCREAS: No acute abnormality. ADRENAL GLANDS: No acute abnormality. KIDNEYS, URETERS AND BLADDER: Nonobstructing right  nephrolithiasis. No stones in the ureters. No hydronephrosis. No perinephric or periureteral stranding. Urinary bladder is unremarkable. GI AND BOWEL: Diffuse dilation of the small bowel with 2 adjacent transition points in the mid left hemiabdomen. The initial transition point is on series 6 image 57. The small bowel downstream from the initial transition point is again dilated with a 2nd transition point in an area of mesenteric swirling (series 6 image 60-48). Findings are concerning for closed loop obstruction. The small bowel downstream from the 2nd transition point is decompressed. Stomach demonstrates no acute abnormality. PERITONEUM AND RETROPERITONEUM: Small volume of pelvic ascites. No free air. VASCULATURE: Aorta is normal in caliber. LYMPH NODES: No lymphadenopathy. REPRODUCTIVE ORGANS: No acute abnormality. BONES AND SOFT TISSUES: No acute osseous abnormality. No focal soft tissue abnormality. IMPRESSION: 1. Findings concerning for closed-loop small-bowel obstruction in the mid left hemiabdomen with two adjacent transition points and mesenteric swirling; downstream bowel decompressed. No findings for acute ischemia. Urgent surgical evaluation recommended. 2. Nonobstructing right nephrolithiasis. Electronically signed by: Norman Gatlin MD 12/16/2023 11:12 PM EDT RP Workstation: [id number]    Anti-infectives: Anti-infectives (From admission, onward)    Start     Dose/Rate Route Frequency Ordered Stop   12/17/23 0115  ceFAZolin  (ANCEF ) IVPB 2g/100 mL premix        2 g 200 mL/hr over 30 Minutes Intravenous  Once 12/17/23 0107 12/17/23 0235        Assessment/Plan POD 1 s/p ex lap, loa by Dr. Aron on 12/17/23 for closed loop small bowel obstruction  - Cont NPO, NGT to LIWS - Cont PCA. Add IV Tylenol  and Robaxin  - Mobilize - Pulm toilet   FEN - NPO, NGT to LIWS, IVF per TRH  VTE - SCDs, okay for chem ppx from a general surgery standpoint ID - Ancef  peri-op. None currently Foley -  Remove    LOS: 1 day    Deward JINNY Foy, MD [facility] Surgery 12/18/2023, 9:23 AM Please see Amion for pager number during day hours 7:00am-4:30pm

## 2023-12-18 NOTE — Plan of Care (Signed)

## 2023-12-18 NOTE — Progress Notes (Signed)
 Spoke with patient about the flutter I brought her and what it does during patient's breathing treatment.  Patient will exhibit back after treatment.

## 2023-12-18 NOTE — Progress Notes (Signed)
 Mobility Specialist - Progress Note  [place] 3 L Pre-mobility: 94% SpO2 During mobility: 89-90% SpO2 Post-mobility: 92% SPO2    12/18/23 1103  Mobility  Activity Ambulated with assistance  Level of Assistance Contact guard assist, steadying assist  Assistive Device Front wheel walker  Distance Ambulated (ft) 60 ft  Activity Response Tolerated well  Mobility Referral Yes  Mobility visit 1 Mobility  Mobility Specialist Start Time (ACUTE ONLY) 1040  Mobility Specialist Stop Time (ACUTE ONLY) 1100  Mobility Specialist Time Calculation (min) (ACUTE ONLY) 20 min   Pt was received in recliner and agreed to mobility. 1x small standing break during ambulation. Returned to bed with all needs met. Call bell in reach. Left in room with family.  [employer] - Mobility Specialist

## 2023-12-18 NOTE — Anesthesia Postprocedure Evaluation (Signed)
 Anesthesia Post Note  Patient: [name] Walker  Procedure(s) Performed: LAPAROTOMY, EXPLORATORY     Patient location during evaluation: PACU Anesthesia Type: General Level of consciousness: awake and alert Pain management: pain level controlled Vital Signs Assessment: post-procedure vital signs reviewed and stable Respiratory status: spontaneous breathing, nonlabored ventilation, respiratory function stable and patient connected to nasal cannula oxygen Cardiovascular status: blood pressure returned to baseline and stable Postop Assessment: no apparent nausea or vomiting Anesthetic complications: no   No notable events documented.  Last Vitals:  Vitals:   12/18/23 0400 12/18/23 0610  BP:  136/79  Pulse:  87  Resp: 19 16  Temp:  36.5 C  SpO2: 90% 90%                    Debby FORBES Like

## 2023-12-19 ENCOUNTER — Inpatient Hospital Stay (HOSPITAL_COMMUNITY)

## 2023-12-19 DIAGNOSIS — K56609 Unspecified intestinal obstruction, unspecified as to partial versus complete obstruction: Secondary | ICD-10-CM | POA: Diagnosis not present

## 2023-12-19 LAB — BASIC METABOLIC PANEL WITH GFR
Anion gap: 12 (ref 5–15)
BUN: 15 mg/dL (ref 8–23)
CO2: 23 mmol/L (ref 22–32)
Calcium: 9 mg/dL (ref 8.9–10.3)
Chloride: 111 mmol/L (ref 98–111)
Creatinine, Ser: 0.49 mg/dL (ref 0.44–1.00)
GFR, Estimated: >60 mL/min (ref >60)
Glucose, Bld: 83 mg/dL (ref 70–99)
Potassium: 3.8 mmol/L (ref 3.5–5.1)
Sodium: 146 mmol/L — ABNORMAL HIGH (ref 135–145)

## 2023-12-19 LAB — CBC
HCT: 35.7 % — ABNORMAL LOW (ref 36.0–46.0)
Hemoglobin: 11.2 g/dL — ABNORMAL LOW (ref 12.0–15.0)
MCH: 28.9 pg (ref 26.0–34.0)
MCHC: 31.4 g/dL (ref 30.0–36.0)
MCV: 92 fL (ref 80.0–100.0)
Platelets: 116 K/uL — ABNORMAL LOW (ref 150–400)
RBC: 3.88 MIL/uL (ref 3.87–5.11)
RDW: 14.8 % (ref 11.5–15.5)
WBC: 5.6 K/uL (ref 4.0–10.5)
nRBC: 0 % (ref 0.0–0.2)

## 2023-12-19 LAB — MAGNESIUM: Magnesium: 2.1 mg/dL (ref 1.7–2.4)

## 2023-12-19 LAB — PHOSPHORUS: Phosphorus: 2 mg/dL — ABNORMAL LOW (ref 2.5–4.6)

## 2023-12-19 MED ORDER — MENTHOL 3 MG MT LOZG
1.0000 | LOZENGE | OROMUCOSAL | Status: DC | PRN
  Filled 2023-12-19: qty 9

## 2023-12-19 MED ORDER — ACETAMINOPHEN 10 MG/ML IV SOLN
1000.0000 mg | Freq: Four times a day (QID) | INTRAVENOUS | Status: AC
Start: 2023-12-19 — End: 2023-12-20
  Administered 2023-12-19 – 2023-12-20 (×4): 1000 mg via INTRAVENOUS
  Filled 2023-12-19 (×4): qty 100

## 2023-12-19 MED ORDER — IPRATROPIUM-ALBUTEROL 0.5-2.5 (3) MG/3ML IN SOLN
3.0000 mL | Freq: Two times a day (BID) | RESPIRATORY_TRACT | Status: DC
  Administered 2023-12-19 – 2023-12-25 (×12): 3 mL via RESPIRATORY_TRACT
  Filled 2023-12-19 (×12): qty 3

## 2023-12-19 NOTE — Progress Notes (Signed)
 Mobility Specialist - Progress Note  [place] 2 L Pre-mobility: 93% SpO2 During mobility: 89-90% SpO2 Post-mobility: 96% SPO2   12/19/23 1156  Mobility  Activity Ambulated with assistance  Level of Assistance Contact guard assist, steadying assist  Assistive Device Front wheel walker  Distance Ambulated (ft) 65 ft  Activity Response Tolerated well  Mobility Referral Yes  Mobility visit 1 Mobility  Mobility Specialist Start Time (ACUTE ONLY) 1034  Mobility Specialist Stop Time (ACUTE ONLY) 1108  Mobility Specialist Time Calculation (min) (ACUTE ONLY) 34 min   Pt was received in bed and agreed to mobility. CGA sit to stand. 1x small standing break during ambulation. Returned to recliner with all needs met. Call bell in reach. Left in room with son.  [employer] - Mobility Specialist

## 2023-12-19 NOTE — Plan of Care (Signed)

## 2023-12-19 NOTE — Plan of Care (Signed)

## 2023-12-19 NOTE — Progress Notes (Signed)
 2 Days Post-Op  Subjective: CC: Stable abdominal pain. On PCA. Some nausea. NGT to LIWS. No flatus or bowel movements. Voiding. Oob to chair.   Objective: Vital signs in last 24 hours: Temp:  [98.6 F (37 C)-99.6 F (37.6 C)] 98.9 F (37.2 C) (10/22 0702) Pulse Rate:  [69-80] 78 (10/22 0702) Resp:  [14-20] 16 (10/22 0757) BP: (138-157)/(84-105) 154/87 (10/22 0702) SpO2:  [87 %-99 %] 97 % (10/22 0757) Weight:  [70 kg] 70 kg (10/22 0455) Last BM Date : 12/15/23  Intake/Output from previous day: 10/21 0701 - 10/22 0700 In: 1946.9 [I.V.:1337.8; IV Piggyback:609.1] Out: 1500 [Urine:1400; Emesis/NG output:100] Intake/Output this shift: Total I/O In: -  Out: 600 [Urine:300; Emesis/NG output:300]  PE: Gen:  Alert, NAD, pleasant Abd: Soft, mild distension, appropriately ttp around her incision without rigidity or guarding. Otherwise NT. Midline wound with honeycomb dressing in place, cdi  Lab Results:  Recent Labs    12/18/23 0439 12/19/23 0505  WBC 5.7 5.6  HGB 11.9* 11.2*  HCT 38.9 35.7*  PLT 111* 116*   BMET Recent Labs    12/18/23 0439 12/19/23 0505  NA 143 146*  K 3.4* 3.8  CL 110 111  CO2 24 23  GLUCOSE 99 83  BUN 19 15  CREATININE 0.60 0.49  CALCIUM  8.7* 9.0   PT/INR No results for input(s): LABPROT, INR in the last 72 hours. CMP     Component Value Date/Time   NA 146 (H) 12/19/2023 0505   K 3.8 12/19/2023 0505   CL 111 12/19/2023 0505   CO2 23 12/19/2023 0505   GLUCOSE 83 12/19/2023 0505   BUN 15 12/19/2023 0505   CREATININE 0.49 12/19/2023 0505   CREATININE 0.58 09/23/2019 1128   CALCIUM  9.0 12/19/2023 0505   PROT 6.4 (L) 12/17/2023 0533   ALBUMIN 4.0 12/17/2023 0533   AST 23 12/17/2023 0533   ALT 14 12/17/2023 0533   ALKPHOS 71 12/17/2023 0533   BILITOT 0.6 12/17/2023 0533   GFRNONAA >60 12/19/2023 0505   GFRAA >60 09/30/2019 1202   Lipase     Component Value Date/Time   LIPASE 21 12/11/2023 1652    Studies/Results: DG  Abd 1 View Result Date: 12/17/2023 EXAM: 1 VIEW XRAY OF THE ABDOMEN 12/17/2023 at 4:30 am COMPARISON: CT abdomen and pelvis 12/16/2023 CLINICAL HISTORY: evaluate NG tube placement FINDINGS: LINES, TUBES AND DEVICES: There is an enteric tube with the tip in the side port below the GE junction. The tip projects over the expected location of the gastric antrum. Midline skin staples are identified. BOWEL: Multiple dilated loops of small bowel identified within the central abdomen and left upper quadrant of the abdomen. SOFT TISSUES: No opaque urinary calculi. BONES: No acute osseous abnormality. IMPRESSION: 1. Enteric tube tip projects over the expected location of the gastric antrum. 2. Multiple dilated loops of small bowel within the left upper quadrant and central abdomen which may reflect postoperative ileus versus obstruction. Electronically signed by: Waddell Calk MD 12/17/2023 06:16 AM EDT RP Workstation: [id number]   CT Renal Stone Study Addendum Date: 12/16/2023 1. Critical value/emergent results were called by telephone at the time of interpretation on 12/16/23 at 11:05 pm to Dr. Griselda, who verbally acknowledged these results. Electronically signed by: Norman Gatlin MD 12/16/2023 11:20 PM EDT RP Workstation: [id number]   Result Date: 12/16/2023 EXAM: CT UROGRAM 12/16/2023 10:49:07 PM TECHNIQUE: CT of the abdomen and pelvis was performed without the administration of intravenous contrast as per CT urogram protocol. Multiplanar  reformatted images as well as MIP urogram images are provided for review. Automated exposure control, iterative reconstruction, and/or weight based adjustment of the mA/kV was utilized to reduce the radiation dose to as low as reasonably achievable. COMPARISON: CT 12/11/2023 CLINICAL HISTORY: Abdominal/flank pain, stone suspected. FINDINGS: LOWER CHEST: No acute abnormality. LIVER: The liver is unremarkable. GALLBLADDER AND BILE DUCTS: Gallbladder is unremarkable. No biliary  ductal dilatation. SPLEEN: No acute abnormality. PANCREAS: No acute abnormality. ADRENAL GLANDS: No acute abnormality. KIDNEYS, URETERS AND BLADDER: Nonobstructing right nephrolithiasis. No stones in the ureters. No hydronephrosis. No perinephric or periureteral stranding. Urinary bladder is unremarkable. GI AND BOWEL: Diffuse dilation of the small bowel with 2 adjacent transition points in the mid left hemiabdomen. The initial transition point is on series 6 image 57. The small bowel downstream from the initial transition point is again dilated with a 2nd transition point in an area of mesenteric swirling (series 6 image [id number]). Findings are concerning for closed loop obstruction. The small bowel downstream from the 2nd transition point is decompressed. Stomach demonstrates no acute abnormality. PERITONEUM AND RETROPERITONEUM: Small volume of pelvic ascites. No free air. VASCULATURE: Aorta is normal in caliber. LYMPH NODES: No lymphadenopathy. REPRODUCTIVE ORGANS: No acute abnormality. BONES AND SOFT TISSUES: No acute osseous abnormality. No focal soft tissue abnormality. IMPRESSION: 1. Findings concerning for closed-loop small-bowel obstruction in the mid left hemiabdomen with two adjacent transition points and mesenteric swirling; downstream bowel decompressed. No findings for acute ischemia. Urgent surgical evaluation recommended. 2. Nonobstructing right nephrolithiasis. Electronically signed by: Norman Gatlin MD 12/16/2023 11:12 PM EDT RP Workstation: [id number]    Anti-infectives: Anti-infectives (From admission, onward)    Start     Dose/Rate Route Frequency Ordered Stop   12/17/23 0115  ceFAZolin  (ANCEF ) IVPB 2g/100 mL premix        2 g 200 mL/hr over 30 Minutes Intravenous  Once 12/17/23 0107 12/17/23 0235        Assessment/Plan POD 2 s/p ex lap, loa by Dr. Aron on 12/17/23 for closed loop small bowel obstruction  - Cont NPO, NGT to LIWS - Cont PCA. Add IV Tylenol  - Mobilize - Pulm  toilet   FEN - NPO, NGT to LIWS, IVF per TRH  VTE - SCDs, Lovenox ID - Ancef  peri-op. None currently Foley - none, spont void    LOS: 2 days    Ozell CHRISTELLA Shaper, [facility] 12/19/2023, 8:58 AM Please see Amion for pager number during day hours 7:00am-4:30pm

## 2023-12-19 NOTE — Progress Notes (Signed)
 PROGRESS NOTE    [name] Walker  FMW:[id number] DOB: [birth year] DOA: 12/16/2023 PCP: Norleen Lynwood ORN, MD   Brief Narrative:  63 year old with past medical history signal for persistent asthma, GAD admitted 10/19 with a small bowel obstruction, initially presented complaining of abdominal pain.  CT omentum pelvis finding concerning for closed-loop small bowel obstruction in the mid hemiabdomen with 2 adjacent transition point and mesenteric swirling . Patient underwent exploratory laparotomy with LOA on 10/20 close loop bowel obstruction.  Assessment & Plan:   Principal Problem:   Small bowel obstruction (HCC) Active Problems:   Tobacco abuse   Generalized abdominal pain   Nausea   Hypercalcemia   History of essential hypertension   Moderate persistent asthma   GAD (generalized anxiety disorder)   Small bowel obstruction, closed-loop, POA  - Status post ex lap and lysis of adhesions 12/17/2023 - Appreciate general surgery insight recommendations - Currently n.p.o. with NG tube clamped -patient hopeful for diet advancement - Pain currently well-controlled on PCA pump - Continue to mobilize as tolerated, incentive spirometry flutter early ambulation ongoing   Hypercalcemia- In the setting of dehydration,  resolved Hypophosphatemia: Replete  Hypokalemia: Replace IV   Atypical Chest Pain:  -Likely GI in origin given burning and midepigastric in the setting of n.p.o. status and small bowel obstruction as above - Troponin remains negative, EKG without overt ST elevations - Pain controlled with PPI and supportive care.  Acute hypoxic respiratory failure, likely secondary to pain -Likely in the setting of splinting given abdominal pain, patient reports bandlike pain at her diaphragm worse with deep inspiration -Continue oxygen, wean as appropriate, ambulating today without hypoxia on 2 L   Transient episode of asymptomatic VT; Replete electrolytes.  Continue to follow  Moderate  persistent asthma; continue nebs, incentive spirometry and flutter, hold steroids  Hypertension -resume home medications once able to tolerate p.o. safely, as needed IV hydralazine in the interim   Leukocytosis: -- Likely reactive given above -currently within normal limits   General anxiety disorder, well-controlled:  - Continue Ativan , Celexa , trazodone  once p.o. as tolerated; diazepam  as needed IV in the interim   Tobacco use disorder, -counseling provided  Thrombocytopenia; monitor, secondary to above.    Obesity Estimated body mass index is 30.08 kg/m as calculated from the following:   Height as of 12/07/23: 5' (1.524 m).   Weight as of 12/07/23: 69.9 kg.  DVT prophylaxis: enoxaparin (LOVENOX) injection 40 mg Start: 12/17/23 2200 SCDs Start: 12/17/23 0417 Code Status:   Code Status: Full Code Family Communication: At bedside  Status is: Inpatient  Dispo: The patient is from: Home              Anticipated d/c is to: Home              Anticipated d/c date is: 48 to 72 hours              Patient currently not medically stable for discharge  Consultants:  General surgery  Procedures:  Ex lap lysis of adhesions 10/20  Antimicrobials:  Perioperatively  Subjective: No acute issues or events overnight denies nausea vomiting diarrhea headache fevers chills or chest pain  Objective: Vitals:   12/19/23 0206 12/19/23 0415 12/19/23 0455 12/19/23 0702  BP: (!) 146/84   (!) 154/87  Pulse: 71   78  Resp: 17 18  14   Temp: 99.6 F (37.6 C)   98.9 F (37.2 C)  TempSrc: Oral   Oral  SpO2: 95% 94%  98%  Weight:   70 kg     Intake/Output Summary (Last 24 hours) at 12/19/2023 0724 Last data filed at 12/19/2023 0612 Gross per 24 hour  Intake 1946.91 ml  Output 1500 ml  Net 446.91 ml   Filed Weights   12/19/23 0455  Weight: 70 kg    Examination:  General:  Pleasantly resting in bed, No acute distress. HEENT:  Normocephalic atraumatic.  Sclerae nonicteric,  noninjected.  Extraocular movements intact bilaterally.  Cortrak intact Neck:  Without mass or deformity.  Trachea is midline. Lungs:  Clear to auscultate bilaterally without rhonchi, wheeze, or rales. Heart:  Regular rate and rhythm.  Without murmurs, rubs, or gallops. Abdomen:  Soft, moderately tender diffusely without guarding or rebound. Extremities: Without cyanosis, clubbing, edema, or obvious deformity. Skin:  Warm and dry, no erythema.  Data Reviewed: I have personally reviewed following labs and imaging studies  CBC: Recent Labs  Lab 12/16/23 2232 12/17/23 0533 12/18/23 0439 12/19/23 0505  WBC 6.9 11.1* 5.7 5.6  NEUTROABS 4.8 9.4*  --   --   HGB 16.1* 13.7 11.9* 11.2*  HCT 49.9* 41.9 38.9 35.7*  MCV 86.5 89.3 91.3 92.0  PLT 162 142* 111* 116*   Basic Metabolic Panel: Recent Labs  Lab 12/16/23 2232 12/17/23 0533 12/17/23 0827 12/18/23 0439 12/19/23 0505  NA 144 143  --  143 146*  K 3.6 3.5  --  3.4* 3.8  CL 104 110  --  110 111  CO2 22 21*  --  24 23  GLUCOSE 171* 142*  --  99 83  BUN 22 21  --  19 15  CREATININE 1.09* 0.83  --  0.60 0.49  CALCIUM  10.9* 8.4*  --  8.7* 9.0  MG  --  1.7  --  2.5* 2.1  PHOS  --   --  3.9 2.4* 2.0*   GFR: Estimated Creatinine Clearance: 62.8 mL/min (by C-G formula based on SCr of 0.49 mg/dL). Liver Function Tests: Recent Labs  Lab 12/17/23 0533  AST 23  ALT 14  ALKPHOS 71  BILITOT 0.6  PROT 6.4*  ALBUMIN 4.0   No results found for this or any previous visit (from the past 240 hours).   Radiology Studies: DG CHEST PORT 1 VIEW Result Date: 12/18/2023 EXAM: 1 VIEW(S) XRAY OF THE CHEST 12/18/2023 03:12:00 PM COMPARISON: 02/07/2016 CLINICAL HISTORY: Chest pain FINDINGS: LINES, TUBES AND DEVICES: Enteric tube in stomach, likely terminating near gastric antrum. LUNGS AND PLEURA: Low lung volumes. Bibasilar opacities. Small left pleural effusion. There are opacities over the medial aspect of the right upper lung field which  could reflect partial collapse of the right upper lobe. Consider CT for further evaluation. No pulmonary edema. No pneumothorax. HEART AND MEDIASTINUM: Tortuosity of the descending thoracic aorta. No acute abnormality of the cardiac silhouette. BONES AND SOFT TISSUES: No acute osseous abnormality. IMPRESSION: 1. Opacities over the medial right upper lung field suspicious for partial right upper lobe collapse. CT chest recommended for further evaluation. 2. Small left pleural effusion. 3. Low lung volumes with bibasilar opacities. Electronically signed by: Donnice Mania MD 12/18/2023 05:59 PM EDT RP Workstation: [id number]   DG Abd 1 View Result Date: 12/18/2023 CLINICAL DATA:  Check gastric catheter placement EXAM: ABDOMEN - 1 VIEW COMPARISON:  None Available. FINDINGS: Gastric catheter is noted in the distal stomach. Postsurgical changes are seen. Some persistent small bowel dilatation is noted. IMPRESSION: Stable gastric catheter. Persistent small bowel dilatation. Electronically Signed   By: Oneil  Lukens M.D.   On: 12/18/2023 02:51        Scheduled Meds:  arformoterol  15 mcg Nebulization BID   budesonide (PULMICORT) nebulizer solution  0.25 mg Nebulization BID   Chlorhexidine  Gluconate Cloth  6 each Topical Daily   enoxaparin (LOVENOX) injection  40 mg Subcutaneous QHS   HYDROmorphone    Intravenous Q4H   influenza vac split trivalent PF  0.5 mL Intramuscular Tomorrow-1000   ipratropium-albuterol   3 mL Nebulization TID   pantoprazole  (PROTONIX ) IV  40 mg Intravenous Q12H   pneumococcal 20-valent conjugate vaccine  0.5 mL Intramuscular Tomorrow-1000   Continuous Infusions:  lactated ringers  100 mL/hr at 12/18/23 2008     LOS: 2 days   Time spent:  Elsie JAYSON Mont[place], DO [facility]  If 7PM-7AM, please contact night-coverage [email or web address]  12/19/2023, 7:24 AM

## 2023-12-20 DIAGNOSIS — K56609 Unspecified intestinal obstruction, unspecified as to partial versus complete obstruction: Secondary | ICD-10-CM | POA: Diagnosis not present

## 2023-12-20 LAB — BASIC METABOLIC PANEL WITH GFR
Anion gap: 15 (ref 5–15)
BUN: 13 mg/dL (ref 8–23)
CO2: 23 mmol/L (ref 22–32)
Calcium: 9.5 mg/dL (ref 8.9–10.3)
Chloride: 109 mmol/L (ref 98–111)
Creatinine, Ser: 0.47 mg/dL (ref 0.44–1.00)
GFR, Estimated: >60 mL/min (ref >60)
Glucose, Bld: 86 mg/dL (ref 70–99)
Potassium: 3.4 mmol/L — ABNORMAL LOW (ref 3.5–5.1)
Sodium: 146 mmol/L — ABNORMAL HIGH (ref 135–145)

## 2023-12-20 LAB — CBC
HCT: 37.8 % (ref 36.0–46.0)
Hemoglobin: 11.5 g/dL — ABNORMAL LOW (ref 12.0–15.0)
MCH: 27.9 pg (ref 26.0–34.0)
MCHC: 30.4 g/dL (ref 30.0–36.0)
MCV: 91.7 fL (ref 80.0–100.0)
Platelets: 117 K/uL — ABNORMAL LOW (ref 150–400)
RBC: 4.12 MIL/uL (ref 3.87–5.11)
RDW: 14.6 % (ref 11.5–15.5)
WBC: 8.3 K/uL (ref 4.0–10.5)
nRBC: 0 % (ref 0.0–0.2)

## 2023-12-20 MED ORDER — MAGIC MOUTHWASH W/LIDOCAINE
10.0000 mL | Freq: Once | ORAL | Status: AC
  Administered 2023-12-20: 10 mL via ORAL
  Filled 2023-12-20: qty 10

## 2023-12-20 NOTE — Plan of Care (Signed)

## 2023-12-20 NOTE — Progress Notes (Signed)
 PROGRESS NOTE    [name] Walker  FMW:[id number] DOB: 23-Apr-1960 DOA: 12/16/2023 PCP: Norleen Lynwood ORN, MD   Brief Narrative:  63 year old with past medical history signal for persistent asthma, GAD admitted 10/19 with a small bowel obstruction, initially presented complaining of abdominal pain.  CT omentum pelvis finding concerning for closed-loop small bowel obstruction in the mid hemiabdomen with 2 adjacent transition point and mesenteric swirling . Patient underwent exploratory laparotomy with LOA on 10/20 close loop bowel obstruction.  Assessment & Plan:   Principal Problem:   Small bowel obstruction (HCC) Active Problems:   Tobacco abuse   Generalized abdominal pain   Nausea   Hypercalcemia   History of essential hypertension   Moderate persistent asthma   GAD (generalized anxiety disorder)   Small bowel obstruction, closed-loop, POA  - Status post ex lap and lysis of adhesions 12/17/2023 - Appreciate general surgery insight recommendations - Currently n.p.o. with NG to intermittent suction -Pain currently well-controlled on PCA pump with Dilaudid , will likely transition to p.o. medications once bowel function returns - Continue to mobilize as tolerated, incentive spirometry flutter early ambulation ongoing   Hypercalcemia- In the setting of dehydration,  resolved Hypophosphatemia: Replete  Hypokalemia: Replace IV   Atypical Chest Pain:  -Likely GI in origin given burning and midepigastric in the setting of n.p.o. status and small bowel obstruction as above - Troponin remains negative, EKG without overt ST elevations - Pain controlled with PPI and supportive care.  Acute hypoxic respiratory failure, secondary to bilateral pleural effusions -Likely in the setting of splinting given abdominal pain and pleural effusion -Continue oxygen, wean as appropriate, ambulating today without hypoxia on 2 L -No indication at this time for antibiotics, remains afebrile without  leukocytosis   Transient episode of asymptomatic VT; Replete electrolytes.  Continue to follow  Moderate persistent asthma; continue nebs, incentive spirometry and flutter, hold steroids  Hypertension -resume home medications once able to tolerate p.o. safely, as needed IV hydralazine in the interim   Leukocytosis: -- Likely reactive given above -currently within normal limits   General anxiety disorder, well-controlled:  - Continue Ativan , Celexa , trazodone  once p.o. as tolerated; diazepam  as needed IV in the interim   Tobacco use disorder, -counseling provided  Thrombocytopenia; monitor, secondary to above.    Obesity Estimated body mass index is 30.08 kg/m as calculated from the following:   Height as of 12/07/23: 5' (1.524 m).   Weight as of 12/07/23: 69.9 kg.  DVT prophylaxis: enoxaparin (LOVENOX) injection 40 mg Start: 12/17/23 2200 SCDs Start: 12/17/23 0417 Code Status:   Code Status: Full Code Family Communication: At bedside  Status is: Inpatient  Dispo: The patient is from: Home              Anticipated d/c is to: Home              Anticipated d/c date is: 48 to 72 hours              Patient currently not medically stable for discharge  Consultants:  General surgery  Procedures:  Ex lap lysis of adhesions 10/20  Antimicrobials:  Perioperatively  Subjective: No acute issues or events overnight denies nausea vomiting diarrhea headache fevers chills or chest pain  Objective: Vitals:   12/19/23 2055 12/20/23 0003 12/20/23 0413 12/20/23 0634  BP: (!) 159/95   (!) 165/94  Pulse: 66   62  Resp: 16 11 13 16   Temp: 98.3 F (36.8 C)   98.5 F (36.9 C)  TempSrc: Oral   Oral  SpO2: 99% 98% 98% 99%  Weight:        Intake/Output Summary (Last 24 hours) at 12/20/2023 0729 Last data filed at 12/20/2023 0630 Gross per 24 hour  Intake 451.68 ml  Output 2300 ml  Net -1848.32 ml   Filed Weights   12/19/23 0455  Weight: 70 kg    Examination:  General:   Pleasantly resting in bed, No acute distress. HEENT:  Normocephalic atraumatic.  Sclerae nonicteric, noninjected.  Extraocular movements intact bilaterally.  Cortrak intact Neck:  Without mass or deformity.  Trachea is midline. Lungs:  Clear to auscultate bilaterally without rhonchi, wheeze, or rales. Heart:  Regular rate and rhythm.  Without murmurs, rubs, or gallops. Abdomen:  Soft, moderately tender diffusely without guarding or rebound. Extremities: Without cyanosis, clubbing, edema, or obvious deformity. Skin:  Warm and dry, no erythema.  Data Reviewed: I have personally reviewed following labs and imaging studies  CBC: Recent Labs  Lab 12/16/23 2232 12/17/23 0533 12/18/23 0439 12/19/23 0505 12/20/23 0448  WBC 6.9 11.1* 5.7 5.6 8.3  NEUTROABS 4.8 9.4*  --   --   --   HGB 16.1* 13.7 11.9* 11.2* 11.5*  HCT 49.9* 41.9 38.9 35.7* 37.8  MCV 86.5 89.3 91.3 92.0 91.7  PLT 162 142* 111* 116* 117*   Basic Metabolic Panel: Recent Labs  Lab 12/16/23 2232 12/17/23 0533 12/17/23 0827 12/18/23 0439 12/19/23 0505 12/20/23 0448  NA 144 143  --  143 146* 146*  K 3.6 3.5  --  3.4* 3.8 3.4*  CL 104 110  --  110 111 109  CO2 22 21*  --  24 23 23   GLUCOSE 171* 142*  --  99 83 86  BUN 22 21  --  19 15 13   CREATININE 1.09* 0.83  --  0.60 0.49 0.47  CALCIUM  10.9* 8.4*  --  8.7* 9.0 9.5  MG  --  1.7  --  2.5* 2.1  --   PHOS  --   --  3.9 2.4* 2.0*  --    GFR: Estimated Creatinine Clearance: 62.8 mL/min (by C-G formula based on SCr of 0.47 mg/dL). Liver Function Tests: Recent Labs  Lab 12/17/23 0533  AST 23  ALT 14  ALKPHOS 71  BILITOT 0.6  PROT 6.4*  ALBUMIN 4.0   No results found for this or any previous visit (from the past 240 hours).   Radiology Studies: CT CHEST WO CONTRAST Result Date: 12/19/2023 CLINICAL DATA:  Abnormal chest radiograph, lung nodule. EXAM: CT CHEST WITHOUT CONTRAST TECHNIQUE: Multidetector CT imaging of the chest was performed following the standard  protocol without IV contrast. RADIATION DOSE REDUCTION: This exam was performed according to the departmental dose-optimization program which includes automated exposure control, adjustment of the mA and/or kV according to patient size and/or use of iterative reconstruction technique. COMPARISON:  Chest radiograph 12/18/2023. FINDINGS: Cardiovascular: Right-sided aortic arch. Atherosclerotic calcification of the aorta. Heart is enlarged. No pericardial effusion. Mediastinum/Nodes: No pathologically enlarged mediastinal or axillary lymph nodes. Hilar regions are difficult to definitively evaluate without IV contrast. Nasogastric tube in the esophagus. Lungs/Pleura: Centrilobular emphysema. Patchy bilateral ground-glass with small bilateral pleural effusions. Volume loss in both lower lobes. Mild basilar septal thickening. Lingular scarring. Airway is unremarkable. Upper Abdomen: Elevated right hemidiaphragm. Small right renal stone. Nasogastric tube is partially imaged in the stomach. Visualized portions of the liver, gallbladder, adrenal glands, kidneys, spleen, pancreas, stomach and bowel are otherwise grossly unremarkable. No upper abdominal  adenopathy. Musculoskeletal: Degenerative changes in the spine. IMPRESSION: 1. Patchy bilateral ground-glass with septal thickening and small bilateral pleural effusions, findings which may be due to congestive heart failure. An atypical or viral pneumonia cannot be excluded. 2. Bilateral lower lobe atelectasis. 3. Small right renal stone. 4.  Aortic atherosclerosis (ICD10-I70.0). 5. Emphysema (ICD10-J43.9). Low-dose CT lung cancer screening is recommended for patients who are 6-44 years of age with a 20+ pack-year history of smoking and who are currently smoking or quit <=15 years ago. Electronically Signed   By: Newell Eke M.D.   On: 12/19/2023 12:47   DG CHEST PORT 1 VIEW Result Date: 12/18/2023 EXAM: 1 VIEW(S) XRAY OF THE CHEST 12/18/2023 03:12:00 PM COMPARISON:  02/07/2016 CLINICAL HISTORY: Chest pain FINDINGS: LINES, TUBES AND DEVICES: Enteric tube in stomach, likely terminating near gastric antrum. LUNGS AND PLEURA: Low lung volumes. Bibasilar opacities. Small left pleural effusion. There are opacities over the medial aspect of the right upper lung field which could reflect partial collapse of the right upper lobe. Consider CT for further evaluation. No pulmonary edema. No pneumothorax. HEART AND MEDIASTINUM: Tortuosity of the descending thoracic aorta. No acute abnormality of the cardiac silhouette. BONES AND SOFT TISSUES: No acute osseous abnormality. IMPRESSION: 1. Opacities over the medial right upper lung field suspicious for partial right upper lobe collapse. CT chest recommended for further evaluation. 2. Small left pleural effusion. 3. Low lung volumes with bibasilar opacities. Electronically signed by: Donnice Mania MD 12/18/2023 05:59 PM EDT RP Workstation: [id number]        Scheduled Meds:  arformoterol  15 mcg Nebulization BID   budesonide (PULMICORT) nebulizer solution  0.25 mg Nebulization BID   Chlorhexidine  Gluconate Cloth  6 each Topical Daily   enoxaparin (LOVENOX) injection  40 mg Subcutaneous QHS   HYDROmorphone    Intravenous Q4H   influenza vac split trivalent PF  0.5 mL Intramuscular Tomorrow-1000   ipratropium-albuterol   3 mL Nebulization BID   pantoprazole  (PROTONIX ) IV  40 mg Intravenous Q12H   pneumococcal 20-valent conjugate vaccine  0.5 mL Intramuscular Tomorrow-1000   Continuous Infusions:     LOS: 3 days   Time spent:  Elsie JAYSON [place], DO [facility]  If 7PM-7AM, please contact night-coverage [email or web address]  12/20/2023, 7:29 AM

## 2023-12-20 NOTE — Progress Notes (Signed)
 Patient complained of pain and irritation in her throat and going down her throat.  Patient stated, the spray burns when I use it. Patient noted to have chloraseptic spray and lozenges on bedside table. MD on call notified. New one time order received. Patient also went to the restroom while this writer was in the room and stated,  I passed gas. Plan of care ongoing.

## 2023-12-20 NOTE — Progress Notes (Signed)
 Progress Note  3 Days Post-Op  Subjective: Patient accompanied by her son.   Reports generalized abdominal pain that is more prominent of epigastric region. Feels this manageable. Denies worsening pain. Having flatulence. Currently NPO. Having SOB with walking. Denies chest pain and SOB at rest. Denies BM, nausea, and vomiting.   ROS  All negative with the exception of above.  Objective: Vital signs in last 24 hours: Temp:  [98.1 F (36.7 C)-98.5 F (36.9 C)] 98.5 F (36.9 C) (10/23 0634) Pulse Rate:  [62-78] 62 (10/23 0634) Resp:  [11-18] 12 (10/23 0759) BP: (136-165)/(73-95) 165/94 (10/23 0634) SpO2:  [93 %-100 %] 94 % (10/23 0929) Last BM Date : 12/15/23  Intake/Output from previous day: 10/22 0701 - 10/23 0700 In: 451.7 [I.V.:131.7; IV Piggyback:320] Out: 2300 [Urine:1850; Emesis/NG output:450] Intake/Output this shift: No intake/output data recorded.  PE: General: Pleasant female who is sitting at bedside in chair in NAD. HEENT: Head is normocephalic, atraumatic. NGT in place connected to wall cannister. Heart: HR normal. Lungs: Respiratory effort nonlabored. Abd: Soft with mild distention. Generalized tenderness to palpation that is more prominent of epigastric region. No rebound tenderness or guarding. Midline incision present with honeycomb dressing. C/D/I.  Skin: Warm and dry Psych: A&Ox3 with an appropriate affect.    Lab Results:  Recent Labs    12/19/23 0505 12/20/23 0448  WBC 5.6 8.3  HGB 11.2* 11.5*  HCT 35.7* 37.8  PLT 116* 117*   BMET Recent Labs    12/19/23 0505 12/20/23 0448  NA 146* 146*  K 3.8 3.4*  CL 111 109  CO2 23 23  GLUCOSE 83 86  BUN 15 13  CREATININE 0.49 0.47  CALCIUM  9.0 9.5   PT/INR No results for input(s): LABPROT, INR in the last 72 hours. CMP     Component Value Date/Time   NA 146 (H) 12/20/2023 0448   K 3.4 (L) 12/20/2023 0448   CL 109 12/20/2023 0448   CO2 23 12/20/2023 0448   GLUCOSE 86 12/20/2023  0448   BUN 13 12/20/2023 0448   CREATININE 0.47 12/20/2023 0448   CREATININE 0.58 09/23/2019 1128   CALCIUM  9.5 12/20/2023 0448   PROT 6.4 (L) 12/17/2023 0533   ALBUMIN 4.0 12/17/2023 0533   AST 23 12/17/2023 0533   ALT 14 12/17/2023 0533   ALKPHOS 71 12/17/2023 0533   BILITOT 0.6 12/17/2023 0533   GFRNONAA >60 12/20/2023 0448   GFRAA >60 09/30/2019 1202   Lipase     Component Value Date/Time   LIPASE 21 12/11/2023 1652       Studies/Results: CT CHEST WO CONTRAST Result Date: 12/19/2023 CLINICAL DATA:  Abnormal chest radiograph, lung nodule. EXAM: CT CHEST WITHOUT CONTRAST TECHNIQUE: Multidetector CT imaging of the chest was performed following the standard protocol without IV contrast. RADIATION DOSE REDUCTION: This exam was performed according to the departmental dose-optimization program which includes automated exposure control, adjustment of the mA and/or kV according to patient size and/or use of iterative reconstruction technique. COMPARISON:  Chest radiograph 12/18/2023. FINDINGS: Cardiovascular: Right-sided aortic arch. Atherosclerotic calcification of the aorta. Heart is enlarged. No pericardial effusion. Mediastinum/Nodes: No pathologically enlarged mediastinal or axillary lymph nodes. Hilar regions are difficult to definitively evaluate without IV contrast. Nasogastric tube in the esophagus. Lungs/Pleura: Centrilobular emphysema. Patchy bilateral ground-glass with small bilateral pleural effusions. Volume loss in both lower lobes. Mild basilar septal thickening. Lingular scarring. Airway is unremarkable. Upper Abdomen: Elevated right hemidiaphragm. Small right renal stone. Nasogastric tube is partially imaged in  the stomach. Visualized portions of the liver, gallbladder, adrenal glands, kidneys, spleen, pancreas, stomach and bowel are otherwise grossly unremarkable. No upper abdominal adenopathy. Musculoskeletal: Degenerative changes in the spine. IMPRESSION: 1. Patchy bilateral  ground-glass with septal thickening and small bilateral pleural effusions, findings which may be due to congestive heart failure. An atypical or viral pneumonia cannot be excluded. 2. Bilateral lower lobe atelectasis. 3. Small right renal stone. 4.  Aortic atherosclerosis (ICD10-I70.0). 5. Emphysema (ICD10-J43.9). Low-dose CT lung cancer screening is recommended for patients who are 25-48 years of age with a 20+ pack-year history of smoking and who are currently smoking or quit <=15 years ago. Electronically Signed   By: Newell Eke M.D.   On: 12/19/2023 12:47   DG CHEST PORT 1 VIEW Result Date: 12/18/2023 EXAM: 1 VIEW(S) XRAY OF THE CHEST 12/18/2023 03:12:00 PM COMPARISON: 02/07/2016 CLINICAL HISTORY: Chest pain FINDINGS: LINES, TUBES AND DEVICES: Enteric tube in stomach, likely terminating near gastric antrum. LUNGS AND PLEURA: Low lung volumes. Bibasilar opacities. Small left pleural effusion. There are opacities over the medial aspect of the right upper lung field which could reflect partial collapse of the right upper lobe. Consider CT for further evaluation. No pulmonary edema. No pneumothorax. HEART AND MEDIASTINUM: Tortuosity of the descending thoracic aorta. No acute abnormality of the cardiac silhouette. BONES AND SOFT TISSUES: No acute osseous abnormality. IMPRESSION: 1. Opacities over the medial right upper lung field suspicious for partial right upper lobe collapse. CT chest recommended for further evaluation. 2. Small left pleural effusion. 3. Low lung volumes with bibasilar opacities. Electronically signed by: Donnice Mania MD 12/18/2023 05:59 PM EDT RP Workstation: [id number]    Anti-infectives: Anti-infectives (From admission, onward)    Start     Dose/Rate Route Frequency Ordered Stop   12/17/23 0115  ceFAZolin  (ANCEF ) IVPB 2g/100 mL premix        2 g 200 mL/hr over 30 Minutes Intravenous  Once 12/17/23 0107 12/17/23 0235        Assessment/Plan POD 3 s/p ex lap, loa by Dr.  Aron on 12/17/23 for closed loop small bowel obstruction  - Had CT chest WO contrast 10/22 showing bilateral pleural effusions. Could not exclude atypical or viral pneumonia. Initiation of antibiotics deferred to primary. On 2L of oxygen. - Afebrile.  - WBC 8.3 and HGB 11.5. - Tolerating pain. No BM yet. Having flatulence.  - Cont NPO, NGT to LIWS. Total output noted to be 450 mL from 10/22-10/23 - Cont PCA.  - Mobilize as tolerated.  - Emphasized using IS.    FEN - NPO, NGT to LIWS, IVF per TRH  VTE - SCDs, Lovenox ID - Ancef  peri-op. None currently Foley - none, spont void   LOS: 3 days   I reviewed hospitalist notes, specialist notes, nursing notes, last 24 h vitals and pain scores, last 48 h intake and output, last 24 h labs and trends, and last 24 h imaging results.   Marjorie Carlyon Favre, [facility] Surgery 12/20/2023, 10:16 AM Please see Amion for pager number during day hours 7:00am-4:30pm

## 2023-12-21 DIAGNOSIS — K56609 Unspecified intestinal obstruction, unspecified as to partial versus complete obstruction: Secondary | ICD-10-CM | POA: Diagnosis not present

## 2023-12-21 MED ORDER — FAMOTIDINE IN NACL 20-0.9 MG/50ML-% IV SOLN
20.0000 mg | Freq: Once | INTRAVENOUS | Status: AC
  Administered 2023-12-21: 20 mg via INTRAVENOUS
  Filled 2023-12-21: qty 50

## 2023-12-21 MED ORDER — HYDRALAZINE HCL 20 MG/ML IJ SOLN
5.0000 mg | Freq: Four times a day (QID) | INTRAMUSCULAR | Status: DC | PRN
  Administered 2023-12-22: 5 mg via INTRAVENOUS
  Filled 2023-12-21 (×2): qty 1

## 2023-12-21 NOTE — Progress Notes (Signed)
 PROGRESS NOTE    [name] Walker  FMW:[id number] DOB: 1960-05-11 DOA: 12/16/2023 PCP: Norleen Lynwood ORN, MD   Brief Narrative:  63 year old with past medical history signal for persistent asthma, GAD admitted 10/19 with a small bowel obstruction, initially presented complaining of abdominal pain.  CT omentum pelvis finding concerning for closed-loop small bowel obstruction in the mid hemiabdomen with 2 adjacent transition point and mesenteric swirling . Patient underwent exploratory laparotomy with LOA on 10/20 close loop bowel obstruction.  Assessment & Plan:   Principal Problem:   Small bowel obstruction (HCC) Active Problems:   Tobacco abuse   Generalized abdominal pain   Nausea   Hypercalcemia   History of essential hypertension   Moderate persistent asthma   GAD (generalized anxiety disorder)  Small bowel obstruction, closed-loop, POA  - Status post ex lap and lysis of adhesions 12/17/2023 - Appreciate general surgery insight recommendations - Currently n.p.o. with NG to intermittent suction - No bowel movement noted or flatus noted over the past 24 hours -Pain currently well-controlled on PCA pump with Dilaudid , will likely transition to p.o. medications once bowel function returns - Continue to mobilize as tolerated, incentive spirometry flutter early ambulation ongoing   Hypernatremia - Likely hypovolemic - continue IVF as appropriate until PO intake improves Hypercalcemia- In the setting of dehydration,  resolved Hypophosphatemia: Repleted Hypokalemia: Replace PRN   Atypical Chest Pain:  -Likely GI in origin given burning and midepigastric in the setting of n.p.o. status and small bowel obstruction as above - Troponin remains negative, EKG without overt ST elevations - Pain controlled with PPI and supportive care.  Acute hypoxic respiratory failure, secondary to bilateral pleural effusions -Likely in the setting of splinting given abdominal pain and pleural  effusion -Continue oxygen, wean as appropriate, ambulating today without hypoxia on 2 L -No indication at this time for antibiotics, remains afebrile without leukocytosis   Transient episode of asymptomatic VT; Replete electrolytes.  Continue to follow   Moderate persistent asthma; continue nebs, incentive spirometry and flutter, hold steroids   Hypertension -resume home medications once able to tolerate p.o. safely, as needed IV hydralazine in the interim   Leukocytosis: -- Likely reactive given above -currently within normal limits   General anxiety disorder, well-controlled:  - Continue Ativan , Celexa , trazodone  once p.o. as tolerated; diazepam  as needed IV in the interim   Tobacco use disorder, -counseling provided  Thrombocytopenia; monitor, secondary to above.    Obesity Estimated body mass index is 30.08 kg/m as calculated from the following:   Height as of 12/07/23: 5' (1.524 m).   Weight as of 12/07/23: 69.9 kg.  DVT prophylaxis: enoxaparin (LOVENOX) injection 40 mg Start: 12/17/23 2200 SCDs Start: 12/17/23 0417 Code Status:   Code Status: Full Code Family Communication: At bedside  Status is: Inpatient  Dispo: The patient is from: Home              Anticipated d/c is to: Home              Anticipated d/c date is: 48 to 72 hours              Patient currently not medically stable for discharge  Consultants:  General surgery  Procedures:  Ex lap lysis of adhesions 10/20  Antimicrobials:  Perioperatively  Subjective: No acute issues or events overnight denies nausea vomiting diarrhea headache fevers chills or chest pain  Objective: Vitals:   12/21/23 0545 12/21/23 0611 12/21/23 0613 12/21/23 0615  BP: (!) 161/89  Pulse: 63     Resp: 18     Temp: 98.8 F (37.1 C)     TempSrc: Oral     SpO2: 94% 99% 99% 99%  Weight:        Intake/Output Summary (Last 24 hours) at 12/21/2023 0758 Last data filed at 12/21/2023 0600 Gross per 24 hour  Intake 4.8  ml  Output 900 ml  Net -895.2 ml   Filed Weights   12/19/23 0455  Weight: 70 kg    Examination:  General:  Pleasantly resting in bed, No acute distress. HEENT:  Normocephalic atraumatic.  Sclerae nonicteric, noninjected.  Extraocular movements intact bilaterally.  Cortrak intact Neck:  Without mass or deformity.  Trachea is midline. Lungs:  Clear to auscultate bilaterally without rhonchi, wheeze, or rales. Heart:  Regular rate and rhythm.  Without murmurs, rubs, or gallops. Abdomen:  Soft, moderately tender diffusely without guarding or rebound. Extremities: Without cyanosis, clubbing, edema, or obvious deformity. Skin:  Warm and dry, no erythema.  Data Reviewed: I have personally reviewed following labs and imaging studies  CBC: Recent Labs  Lab 12/16/23 2232 12/17/23 0533 12/18/23 0439 12/19/23 0505 12/20/23 0448  WBC 6.9 11.1* 5.7 5.6 8.3  NEUTROABS 4.8 9.4*  --   --   --   HGB 16.1* 13.7 11.9* 11.2* 11.5*  HCT 49.9* 41.9 38.9 35.7* 37.8  MCV 86.5 89.3 91.3 92.0 91.7  PLT 162 142* 111* 116* 117*   Basic Metabolic Panel: Recent Labs  Lab 12/16/23 2232 12/17/23 0533 12/17/23 0827 12/18/23 0439 12/19/23 0505 12/20/23 0448  NA 144 143  --  143 146* 146*  K 3.6 3.5  --  3.4* 3.8 3.4*  CL 104 110  --  110 111 109  CO2 22 21*  --  24 23 23   GLUCOSE 171* 142*  --  99 83 86  BUN 22 21  --  19 15 13   CREATININE 1.09* 0.83  --  0.60 0.49 0.47  CALCIUM  10.9* 8.4*  --  8.7* 9.0 9.5  MG  --  1.7  --  2.5* 2.1  --   PHOS  --   --  3.9 2.4* 2.0*  --    GFR: Estimated Creatinine Clearance: 62.8 mL/min (by C-G formula based on SCr of 0.47 mg/dL). Liver Function Tests: Recent Labs  Lab 12/17/23 0533  AST 23  ALT 14  ALKPHOS 71  BILITOT 0.6  PROT 6.4*  ALBUMIN 4.0   No results found for this or any previous visit (from the past 240 hours).   Radiology Studies: CT CHEST WO CONTRAST Result Date: 12/19/2023 CLINICAL DATA:  Abnormal chest radiograph, lung nodule.  EXAM: CT CHEST WITHOUT CONTRAST TECHNIQUE: Multidetector CT imaging of the chest was performed following the standard protocol without IV contrast. RADIATION DOSE REDUCTION: This exam was performed according to the departmental dose-optimization program which includes automated exposure control, adjustment of the mA and/or kV according to patient size and/or use of iterative reconstruction technique. COMPARISON:  Chest radiograph 12/18/2023. FINDINGS: Cardiovascular: Right-sided aortic arch. Atherosclerotic calcification of the aorta. Heart is enlarged. No pericardial effusion. Mediastinum/Nodes: No pathologically enlarged mediastinal or axillary lymph nodes. Hilar regions are difficult to definitively evaluate without IV contrast. Nasogastric tube in the esophagus. Lungs/Pleura: Centrilobular emphysema. Patchy bilateral ground-glass with small bilateral pleural effusions. Volume loss in both lower lobes. Mild basilar septal thickening. Lingular scarring. Airway is unremarkable. Upper Abdomen: Elevated right hemidiaphragm. Small right renal stone. Nasogastric tube is partially imaged in the stomach.  Visualized portions of the liver, gallbladder, adrenal glands, kidneys, spleen, pancreas, stomach and bowel are otherwise grossly unremarkable. No upper abdominal adenopathy. Musculoskeletal: Degenerative changes in the spine. IMPRESSION: 1. Patchy bilateral ground-glass with septal thickening and small bilateral pleural effusions, findings which may be due to congestive heart failure. An atypical or viral pneumonia cannot be excluded. 2. Bilateral lower lobe atelectasis. 3. Small right renal stone. 4.  Aortic atherosclerosis (ICD10-I70.0). 5. Emphysema (ICD10-J43.9). Low-dose CT lung cancer screening is recommended for patients who are 74-33 years of age with a 20+ pack-year history of smoking and who are currently smoking or quit <=15 years ago. Electronically Signed   By: Newell Eke M.D.   On: 12/19/2023 12:47         Scheduled Meds:  arformoterol  15 mcg Nebulization BID   budesonide (PULMICORT) nebulizer solution  0.25 mg Nebulization BID   Chlorhexidine  Gluconate Cloth  6 each Topical Daily   enoxaparin (LOVENOX) injection  40 mg Subcutaneous QHS   HYDROmorphone    Intravenous Q4H   influenza vac split trivalent PF  0.5 mL Intramuscular Tomorrow-1000   ipratropium-albuterol   3 mL Nebulization BID   pantoprazole  (PROTONIX ) IV  40 mg Intravenous Q12H   pneumococcal 20-valent conjugate vaccine  0.5 mL Intramuscular Tomorrow-1000   Continuous Infusions:     LOS: 4 days   Time spent:  Elsie JAYSON [place], DO [facility]  If 7PM-7AM, please contact night-coverage [email or web address]  12/21/2023, 7:58 AM

## 2023-12-21 NOTE — Plan of Care (Signed)
 ?  Problem: Clinical Measurements: ?Goal: Ability to maintain clinical measurements within normal limits will improve ?Outcome: Progressing ?Goal: Will remain free from infection ?Outcome: Progressing ?Goal: Diagnostic test results will improve ?Outcome: Progressing ?  ?

## 2023-12-21 NOTE — Progress Notes (Signed)
 Progress Note  4 Days Post-Op  Subjective: Reports generalized soreness and epigastric pain. Continues to feel this is manageable with PCA. Denies worsening pain. Having flatulence. Currently NPO. Denies BM, nausea, and vomiting.   ROS  All negative with the exception of above.  Objective: Vital signs in last 24 hours: Temp:  [98.1 F (36.7 C)-99 F (37.2 C)] 98.8 F (37.1 C) (10/24 0545) Pulse Rate:  [63-71] 63 (10/24 0545) Resp:  [11-18] 11 (10/24 0757) BP: (140-167)/(80-91) 161/89 (10/24 0545) SpO2:  [92 %-100 %] 99 % (10/24 0757) Last BM Date : 12/15/23  Intake/Output from previous day: 10/23 0701 - 10/24 0700 In: 4.8 [I.V.:4.8] Out: 900 [Urine:900] Intake/Output this shift: No intake/output data recorded.  PE: General: Pleasant female who is in bed in NAD. HEENT: Head is normocephalic, atraumatic. NGT in place connected to wall cannister. Heart: HR normal. Lungs: Respiratory effort nonlabored. Abd: Soft with mild distention. Generalized tenderness to palpation that is more prominent of epigastric region. No rebound tenderness or guarding. Midline incision present with honeycomb dressing. C/D/I.  Skin: Warm and dry Psych: A&Ox3 with an appropriate affect.    Lab Results:  Recent Labs    12/19/23 0505 12/20/23 0448  WBC 5.6 8.3  HGB 11.2* 11.5*  HCT 35.7* 37.8  PLT 116* 117*   BMET Recent Labs    12/19/23 0505 12/20/23 0448  NA 146* 146*  K 3.8 3.4*  CL 111 109  CO2 23 23  GLUCOSE 83 86  BUN 15 13  CREATININE 0.49 0.47  CALCIUM  9.0 9.5   PT/INR No results for input(s): LABPROT, INR in the last 72 hours. CMP     Component Value Date/Time   NA 146 (H) 12/20/2023 0448   K 3.4 (L) 12/20/2023 0448   CL 109 12/20/2023 0448   CO2 23 12/20/2023 0448   GLUCOSE 86 12/20/2023 0448   BUN 13 12/20/2023 0448   CREATININE 0.47 12/20/2023 0448   CREATININE 0.58 09/23/2019 1128   CALCIUM  9.5 12/20/2023 0448   PROT 6.4 (L) 12/17/2023 0533    ALBUMIN 4.0 12/17/2023 0533   AST 23 12/17/2023 0533   ALT 14 12/17/2023 0533   ALKPHOS 71 12/17/2023 0533   BILITOT 0.6 12/17/2023 0533   GFRNONAA >60 12/20/2023 0448   GFRAA >60 09/30/2019 1202   Lipase     Component Value Date/Time   LIPASE 21 12/11/2023 1652       Studies/Results: CT CHEST WO CONTRAST Result Date: 12/19/2023 CLINICAL DATA:  Abnormal chest radiograph, lung nodule. EXAM: CT CHEST WITHOUT CONTRAST TECHNIQUE: Multidetector CT imaging of the chest was performed following the standard protocol without IV contrast. RADIATION DOSE REDUCTION: This exam was performed according to the departmental dose-optimization program which includes automated exposure control, adjustment of the mA and/or kV according to patient size and/or use of iterative reconstruction technique. COMPARISON:  Chest radiograph 12/18/2023. FINDINGS: Cardiovascular: Right-sided aortic arch. Atherosclerotic calcification of the aorta. Heart is enlarged. No pericardial effusion. Mediastinum/Nodes: No pathologically enlarged mediastinal or axillary lymph nodes. Hilar regions are difficult to definitively evaluate without IV contrast. Nasogastric tube in the esophagus. Lungs/Pleura: Centrilobular emphysema. Patchy bilateral ground-glass with small bilateral pleural effusions. Volume loss in both lower lobes. Mild basilar septal thickening. Lingular scarring. Airway is unremarkable. Upper Abdomen: Elevated right hemidiaphragm. Small right renal stone. Nasogastric tube is partially imaged in the stomach. Visualized portions of the liver, gallbladder, adrenal glands, kidneys, spleen, pancreas, stomach and bowel are otherwise grossly unremarkable. No upper abdominal adenopathy. Musculoskeletal:  Degenerative changes in the spine. IMPRESSION: 1. Patchy bilateral ground-glass with septal thickening and small bilateral pleural effusions, findings which may be due to congestive heart failure. An atypical or viral pneumonia  cannot be excluded. 2. Bilateral lower lobe atelectasis. 3. Small right renal stone. 4.  Aortic atherosclerosis (ICD10-I70.0). 5. Emphysema (ICD10-J43.9). Low-dose CT lung cancer screening is recommended for patients who are 75-16 years of age with a 20+ pack-year history of smoking and who are currently smoking or quit <=15 years ago. Electronically Signed   By: Newell Eke M.D.   On: 12/19/2023 12:47    Anti-infectives: Anti-infectives (From admission, onward)    Start     Dose/Rate Route Frequency Ordered Stop   12/17/23 0115  ceFAZolin  (ANCEF ) IVPB 2g/100 mL premix        2 g 200 mL/hr over 30 Minutes Intravenous  Once 12/17/23 0107 12/17/23 0235        Assessment/Plan POD 4 s/p ex lap, loa by Dr. Aron on 12/17/23 for closed loop small bowel obstruction  - Afebrile.  - Labs from 10/23: WBC 8.3 and HGB 11.5. Labs planned for 10/25. - Pain is manageable. No BM yet. Having flatulence.  - Cont NPO, NGT to LIWS. Total output noted to be 450 mL from 10/22-10/23. Total output in cannister this morning 10/24 noted to be 400 mL. 0 recorded. Will consider clamp trail after communicating with nursing staff. - Cont PCA as needed. - Mobilize as tolerated.  - Emphasized using IS.    FEN - NPO, NGT to LIWS, IVF per TRH  VTE - SCDs, Lovenox ID - Ancef  peri-op. None currently Foley - none, spont void    LOS: 4 days   I reviewed hospitalist notes, specialist notes, nursing notes, last 24 h vitals and pain scores, last 48 h intake and output, last 24 h labs and trends, and last 24 h imaging results.   Marjorie Carlyon Favre, [facility] Surgery 12/21/2023, 8:33 AM Please see Amion for pager number during day hours 7:00am-4:30pm

## 2023-12-21 NOTE — Progress Notes (Signed)
 3rd Shift RT completed CPT for 12/21/23 0800 round at approximately 0619- is documented under CPT but too early to remove task from Worklist.

## 2023-12-21 NOTE — Care Management Important Message (Signed)
 Important Message  Patient Details  Name: [name] Walker MRN: [id number] Date of Birth: 1960-07-25   Important Message Given:        Glade Cuff 12/21/2023, 11:12 AM

## 2023-12-22 DIAGNOSIS — K56609 Unspecified intestinal obstruction, unspecified as to partial versus complete obstruction: Secondary | ICD-10-CM | POA: Diagnosis not present

## 2023-12-22 LAB — BASIC METABOLIC PANEL WITH GFR
Anion gap: 12 (ref 5–15)
BUN: 12 mg/dL (ref 8–23)
CO2: 25 mmol/L (ref 22–32)
Calcium: 10 mg/dL (ref 8.9–10.3)
Chloride: 104 mmol/L (ref 98–111)
Creatinine, Ser: 0.42 mg/dL — ABNORMAL LOW (ref 0.44–1.00)
GFR, Estimated: >60 mL/min (ref >60)
Glucose, Bld: 77 mg/dL (ref 70–99)
Potassium: 2.7 mmol/L — CL (ref 3.5–5.1)
Sodium: 141 mmol/L (ref 135–145)

## 2023-12-22 LAB — CBC
HCT: 37.4 % (ref 36.0–46.0)
Hemoglobin: 12.1 g/dL (ref 12.0–15.0)
MCH: 28.7 pg (ref 26.0–34.0)
MCHC: 32.4 g/dL (ref 30.0–36.0)
MCV: 88.8 fL (ref 80.0–100.0)
Platelets: 153 K/uL (ref 150–400)
RBC: 4.21 MIL/uL (ref 3.87–5.11)
RDW: 14.3 % (ref 11.5–15.5)
WBC: 7.8 K/uL (ref 4.0–10.5)
nRBC: 0 % (ref 0.0–0.2)

## 2023-12-22 LAB — MAGNESIUM: Magnesium: 1.8 mg/dL (ref 1.7–2.4)

## 2023-12-22 LAB — PHOSPHORUS: Phosphorus: 2.8 mg/dL (ref 2.5–4.6)

## 2023-12-22 LAB — PREALBUMIN: Prealbumin: 9 mg/dL — ABNORMAL LOW (ref 18–38)

## 2023-12-22 MED ORDER — DIPHENHYDRAMINE HCL 50 MG/ML IJ SOLN: 12.5000 mg | Freq: Four times a day (QID) | INTRAMUSCULAR | Status: DC | PRN

## 2023-12-22 MED ORDER — POTASSIUM CHLORIDE CRYS ER 20 MEQ PO TBCR
40.0000 meq | EXTENDED_RELEASE_TABLET | Freq: Four times a day (QID) | ORAL | Status: AC
  Administered 2023-12-22 (×2): 40 meq via ORAL
  Filled 2023-12-22 (×2): qty 2

## 2023-12-22 MED ORDER — MAGIC MOUTHWASH: 15.0000 mL | Freq: Four times a day (QID) | ORAL | Status: DC | PRN

## 2023-12-22 MED ORDER — PROCHLORPERAZINE EDISYLATE 10 MG/2ML IJ SOLN: 5.0000 mg | INTRAMUSCULAR | Status: DC | PRN

## 2023-12-22 MED ORDER — METHOCARBAMOL 1000 MG/10ML IJ SOLN: 1000.0000 mg | Freq: Four times a day (QID) | INTRAMUSCULAR | Status: DC | PRN

## 2023-12-22 MED ORDER — POLYETHYLENE GLYCOL 3350 17 G PO PACK
17.0000 g | PACK | Freq: Two times a day (BID) | ORAL | Status: DC
  Administered 2023-12-22 – 2023-12-25 (×7): 17 g via ORAL
  Filled 2023-12-22 (×9): qty 1

## 2023-12-22 MED ORDER — SALINE SPRAY 0.65 % NA SOLN
1.0000 | Freq: Four times a day (QID) | NASAL | Status: DC | PRN
  Administered 2023-12-27: 1 via NASAL
  Filled 2023-12-22: qty 44

## 2023-12-22 MED ORDER — NAPHAZOLINE-GLYCERIN 0.012-0.25 % OP SOLN: 1.0000 [drp] | Freq: Four times a day (QID) | OPHTHALMIC | Status: DC | PRN

## 2023-12-22 MED ORDER — PHENOL 1.4 % MT LIQD: 2.0000 | OROMUCOSAL | Status: DC | PRN

## 2023-12-22 MED ORDER — HYDROMORPHONE HCL 1 MG/ML IJ SOLN
0.5000 mg | INTRAMUSCULAR | Status: DC | PRN
  Administered 2023-12-22 – 2023-12-26 (×12): 1 mg via INTRAVENOUS
  Filled 2023-12-22 (×13): qty 1

## 2023-12-22 MED ORDER — POLYETHYLENE GLYCOL 3350 17 G PO PACK: 17.0000 g | PACK | Freq: Two times a day (BID) | ORAL | Status: DC | PRN

## 2023-12-22 MED ORDER — BISACODYL 10 MG RE SUPP: 10.0000 mg | Freq: Two times a day (BID) | RECTAL | Status: DC | PRN

## 2023-12-22 MED ORDER — ACETAMINOPHEN 500 MG PO TABS
1000.0000 mg | ORAL_TABLET | Freq: Four times a day (QID) | ORAL | Status: DC
  Administered 2023-12-22 – 2023-12-28 (×21): 1000 mg via ORAL
  Filled 2023-12-22 (×22): qty 2

## 2023-12-22 MED ORDER — PANTOPRAZOLE SODIUM 40 MG PO TBEC
40.0000 mg | DELAYED_RELEASE_TABLET | Freq: Every day | ORAL | Status: DC
  Administered 2023-12-22 – 2023-12-28 (×7): 40 mg via ORAL
  Filled 2023-12-22 (×7): qty 1

## 2023-12-22 NOTE — Progress Notes (Signed)
   12/22/23 9370  Provider Notification  Provider Name/Title A. Andrez, NP  Date Provider Notified 12/22/23  Time Provider Notified 0630  Method of Notification Page (secure chat message preferred)  Notification Reason Critical Result  Test performed and critical result Potassium  Date Critical Result Received 12/22/23  Time Critical Result Received 0620  Provider response Evaluate remotely

## 2023-12-22 NOTE — Progress Notes (Addendum)
 PROGRESS NOTE    [name] Walker  FMW:[id number] DOB: January 19, 1961 DOA: 12/16/2023 PCP: Norleen Lynwood ORN, MD   Brief Narrative:  63 year old with past medical history signal for persistent asthma, GAD admitted 10/19 with a small bowel obstruction, initially presented complaining of abdominal pain.  CT omentum pelvis finding concerning for closed-loop small bowel obstruction in the mid hemiabdomen with 2 adjacent transition point and mesenteric swirling . Patient underwent exploratory laparotomy with LOA on 10/20 close loop bowel obstruction.  Assessment & Plan:   Principal Problem:   Small bowel obstruction (HCC) Active Problems:   Tobacco abuse   Generalized abdominal pain   Nausea   Hypercalcemia   History of essential hypertension   Moderate persistent asthma   GAD (generalized anxiety disorder)  Small bowel obstruction, closed-loop, POA  - Status post ex lap and lysis of adhesions 12/17/2023 - Appreciate general surgery insight recommendations - NG tube out, advancing diet from clears to fulls today - No bowel movement noted postoperatively but patient is now having flatus overnight - Transitioning off PCA pump per general surgery - Continue to mobilize as tolerated, incentive spirometry flutter early ambulation ongoing   Hypernatremia - Likely hypovolemic - continue IVF as appropriate until PO intake improves Hypercalcemia- In the setting of dehydration,  resolved Hypophosphatemia: Repleted Hypokalemia: Replace PRN   Atypical Chest Pain:  -Likely GI in origin given burning and midepigastric in the setting of n.p.o. status and small bowel obstruction as above - Troponin remains negative, EKG without overt ST elevations - Pain controlled with PPI and supportive care.  Acute hypoxic respiratory failure, secondary to bilateral pleural effusions -Likely in the setting of splinting given abdominal pain and pleural effusion -Continue oxygen, wean as appropriate, ambulating today  without hypoxia on 2 L -No indication at this time for antibiotics, remains afebrile without leukocytosis   Transient episode of asymptomatic VT; Replete electrolytes.  Continue to follow   Moderate persistent asthma; continue nebs, incentive spirometry and flutter, hold steroids - not in acute exacerbation  Hypertension -resume home medications once able to tolerate p.o. safely, as needed IV hydralazine in the interim   Leukocytosis: -- Likely reactive given above -currently within normal limits   General anxiety disorder, well-controlled:  - Continue Ativan , Celexa , trazodone  once p.o. as tolerated; diazepam  as needed IV in the interim   Tobacco use disorder, -counseling provided  Thrombocytopenia; monitor, secondary to above.    Obesity Estimated body mass index is 30.08 kg/m as calculated from the following:   Height as of 12/07/23: 5' (1.524 m).   Weight as of 12/07/23: 69.9 kg.  DVT prophylaxis: enoxaparin (LOVENOX) injection 40 mg Start: 12/17/23 2200 SCDs Start: 12/17/23 0417 Code Status:   Code Status: Full Code Family Communication: At bedside  Status is: Inpatient  Dispo: The patient is from: Home              Anticipated d/c is to: Home              Anticipated d/c date is: 48 to 72 hours              Patient currently not medically stable for discharge  Consultants:  General surgery  Procedures:  Ex lap lysis of adhesions 10/20  Antimicrobials:  Perioperatively  Subjective: No acute issues or events overnight denies nausea vomiting diarrhea headache fevers chills or chest pain  Objective: Vitals:   12/22/23 0137 12/22/23 0410 12/22/23 0419 12/22/23 0559  BP: (!) 166/90 (!) 156/93  (!) 161/87  Pulse: 64   64  Resp: 16  17 16   Temp: 98.9 F (37.2 C)   99 F (37.2 C)  TempSrc: Oral   Oral  SpO2: 100%  99% 100%  Weight:        Intake/Output Summary (Last 24 hours) at 12/22/2023 0652 Last data filed at 12/22/2023 0200 Gross per 24 hour  Intake  1294.1 ml  Output 700 ml  Net 594.1 ml   Filed Weights   12/19/23 0455  Weight: 70 kg    Examination:  General:  Pleasantly resting in bed, No acute distress. HEENT:  Normocephalic atraumatic.  Sclerae nonicteric, noninjected.  Extraocular movements intact bilaterally.  Neck:  Without mass or deformity.  Trachea is midline. Lungs:  Clear to auscultate bilaterally without rhonchi, wheeze, or rales. Heart:  Regular rate and rhythm.  Without murmurs, rubs, or gallops. Abdomen:  Soft, moderately tender diffusely without guarding or rebound. Extremities: Without cyanosis, clubbing, edema, or obvious deformity. Skin:  Warm and dry, no erythema.  Data Reviewed: I have personally reviewed following labs and imaging studies  CBC: Recent Labs  Lab 12/16/23 2232 12/17/23 0533 12/18/23 0439 12/19/23 0505 12/20/23 0448 12/22/23 0532  WBC 6.9 11.1* 5.7 5.6 8.3 7.8  NEUTROABS 4.8 9.4*  --   --   --   --   HGB 16.1* 13.7 11.9* 11.2* 11.5* 12.1  HCT 49.9* 41.9 38.9 35.7* 37.8 37.4  MCV 86.5 89.3 91.3 92.0 91.7 88.8  PLT 162 142* 111* 116* 117* 153   Basic Metabolic Panel: Recent Labs  Lab 12/17/23 0533 12/17/23 0827 12/18/23 0439 12/19/23 0505 12/20/23 0448 12/22/23 0532  NA 143  --  143 146* 146* 141  K 3.5  --  3.4* 3.8 3.4* 2.7*  CL 110  --  110 111 109 104  CO2 21*  --  24 23 23 25   GLUCOSE 142*  --  99 83 86 77  BUN 21  --  19 15 13 12   CREATININE 0.83  --  0.60 0.49 0.47 0.42*  CALCIUM  8.4*  --  8.7* 9.0 9.5 10.0  MG 1.7  --  2.5* 2.1  --   --   PHOS  --  3.9 2.4* 2.0*  --   --    GFR: Estimated Creatinine Clearance: 62.8 mL/min (A) (by C-G formula based on SCr of 0.42 mg/dL (L)). Liver Function Tests: Recent Labs  Lab 12/17/23 0533  AST 23  ALT 14  ALKPHOS 71  BILITOT 0.6  PROT 6.4*  ALBUMIN 4.0   No results found for this or any previous visit (from the past 240 hours).   Radiology Studies: No results found.       Scheduled Meds:  arformoterol   15 mcg Nebulization BID   budesonide (PULMICORT) nebulizer solution  0.25 mg Nebulization BID   Chlorhexidine  Gluconate Cloth  6 each Topical Daily   enoxaparin (LOVENOX) injection  40 mg Subcutaneous QHS   HYDROmorphone    Intravenous Q4H   influenza vac split trivalent PF  0.5 mL Intramuscular Tomorrow-1000   ipratropium-albuterol   3 mL Nebulization BID   pantoprazole  (PROTONIX ) IV  40 mg Intravenous Q12H   pneumococcal 20-valent conjugate vaccine  0.5 mL Intramuscular Tomorrow-1000   potassium chloride   40 mEq Oral Q6H   Continuous Infusions:     LOS: 5 days   Time spent:  Elsie JAYSON Montclair, DO [facility]  If 7PM-7AM, please contact night-coverage [email or web address]  12/22/2023, 6:52 AM

## 2023-12-22 NOTE — Progress Notes (Signed)
 12/22/2023  [name] Walker [id number] Jun 09, 1960  CARE TEAM: PCP: Norleen Lynwood ORN, MD  Outpatient Care Team: Patient Care Team: Norleen Lynwood ORN, MD as PCP - General  Inpatient Treatment Team: Treatment Team:  Lue Elsie BROCKS, MD Ccs, Md, MD Bobbette Cartwright, MD Perri Tinnie LABOR, NT Erlenmeyer, Bernardino HERO, RN Raynaldo Vina GAILS, RN Utomwen, [place], RPH [place], Heather LABOR, LCSW   Problem List:   Principal Problem:   Small bowel obstruction [facility]) Active Problems:   Tobacco abuse   Generalized abdominal pain   Nausea   Hypercalcemia   History of essential hypertension   Moderate persistent asthma   GAD (generalized anxiety disorder)   12/17/2023  PRE-OPERATIVE DIAGNOSIS: closed loop small bowel obstruction   POST-OPERATIVE DIAGNOSIS:  Same   PROCEDURE:  Exploratory laparotomy   SURGEON:  Jina Nephew, MD   FINDINGS:   NGT confirmed in stomach by palpation Closed loop obstruction from adhesion between small bowel and transverse colon omentum.   Murky ascites, no evidence of bowel perforation or ischemia.    Assessment [facility] Stay = 5 days) 5 Days Post-Op    Slowly resolving.    Assessment/Plan POD 4 s/p ex lap, loa by Dr. Nephew on 12/17/23 for closed loop small bowel obstruction  - Afebrile.  - Labs from 10/23: WBC 8.3 and HGB 11.5. Labs planned for 10/25. - Tolerated clamping trial clear liquids.  Advance to full liquids for now. -Stop PCA and see if we can do PRN (as needed) IV meds.  Transition to oral pain control.  Consider oral narcotics if doing well tomorrow.  Try and wean off the IV pole.   - Mobilize as tolerated.  More she is up to better.  That means more pain medications, do it.   FEN -tolerating clears.  Try full liquid diet 10/25.  Follow off IV fluids to try and keep on the dry side.  PRN (as needed) boluses just in case.  IVF per [facility] monitor electrolytes & replace as needed.  Check mag and Phos.  Severe hypokalemia being replaced by  primary service.  Keep K>4, Mg>2, Phos>3  VTE - SCDs, Lovenox ID - Ancef  peri-op. None currently Foley - none, spont void  I updated the patient's status to the patient and significant other  Recommendations were made.  Questions were answered.  They expressed understanding & appreciation.  -Disposition: Probable discharge home if meets goals.  Biggest challenge will be postop pain management.       I reviewed nursing notes, hospitalist notes, last 24 h vitals and pain scores, last 48 h intake and output, last 24 h labs and trends, and last 24 h imaging results.  I have reviewed this patient's available data, including medical history, events of note, test results, etc as part of my evaluation.   A significant portion of that time was spent in counseling. Care during the described time interval was provided by me.  This care required moderate level of medical decision making.  12/22/2023    Subjective: (Chief complaint)  Patient with some soreness but a little more manageable.  Significant other in room.  Walking some.  No nausea with liquids.  Objective:  Vital signs:  Vitals:   12/22/23 0500 12/22/23 0559 12/22/23 0718 12/22/23 0801  BP:  (!) 161/87    Pulse:  64    Resp:  16 13   Temp:  99 F (37.2 C)    TempSrc:  Oral    SpO2:  100% 99% 100%  Weight: 72.3 kg       Last BM Date : 12/15/23  Intake/Output   Yesterday:  10/24 0701 - 10/25 0700 In: 1529.1 [P.O.:1515; I.V.:14.1] Out: 1500 [Urine:1400; Emesis/NG output:100] This shift:  No intake/output data recorded.  Bowel function:  Flatus: YES  BM:  No  Drain: (No drain)   Physical Exam:  General: Pt awake/alert in no acute distress.  Tired but not toxic/sickly Eyes: PERRL, normal EOM.  Sclera clear.  No icterus Neuro: CN II-XII intact w/o focal sensory/motor deficits. Lymph: No head/neck/groin lymphadenopathy Psych:  No delerium/psychosis/paranoia.  Oriented x 4 HENT: Normocephalic, Mucus  membranes moist.  No thrush Neck: Supple, No tracheal deviation.  No obvious thyromegaly Chest: No pain to chest wall compression.  Good respiratory excursion.  No audible wheezing CV:  Pulses intact.  Regular rhythm.  No major extremity edema MS: Normal AROM mjr joints.  No obvious deformity  Abdomen: Soft.  Mildy distended.  Mildly tender at incisions only.  Dressing with old blood.  Incision closed with skin staples.  No ecchymosis/seroma/hematoma/cellulitis.  No evidence of peritonitis.  No incarcerated hernias.  Ext:  No deformity.  No mjr edema.  No cyanosis Skin: No petechiae / purpurea.  No major sores.  Warm and dry    Results:   Cultures: No results found for this or any previous visit (from the past 720 hours).  Labs: Results for orders placed or performed during the hospital encounter of 12/16/23 (from the past 48 hours)  CBC     Status: None   Collection Time: 12/22/23  5:32 AM  Result Value Ref Range   WBC 7.8 4.0 - 10.5 K/uL   RBC 4.21 3.87 - 5.11 MIL/uL   Hemoglobin 12.1 12.0 - 15.0 g/dL   HCT 62.5 63.9 - 53.9 %   MCV 88.8 80.0 - 100.0 fL   MCH 28.7 26.0 - 34.0 pg   MCHC 32.4 30.0 - 36.0 g/dL   RDW 85.6 88.4 - 84.4 %   Platelets 153 150 - 400 K/uL   nRBC 0.0 0.0 - 0.2 %    Comment: Performed at [facility], [address], [place], [place] [address]  Basic metabolic panel with GFR     Status: Abnormal   Collection Time: 12/22/23  5:32 AM  Result Value Ref Range   Sodium 141 135 - 145 mmol/L   Potassium 2.7 (LL) 3.5 - 5.1 mmol/L    Comment: Hemolysis at this level may affect result  H. GEORGINA, RN [phone] 12/22/23 BY V. NICANOR    Chloride 104 98 - 111 mmol/L   CO2 25 22 - 32 mmol/L   Glucose, Bld 77 70 - 99 mg/dL    Comment: Glucose reference range applies only to samples taken after fasting for at least 8 hours.   BUN 12 8 - 23 mg/dL   Creatinine, Ser 9.57 (L) 0.44 - 1.00 mg/dL   Calcium  10.0 8.9 - 10.3 mg/dL   GFR, Estimated >39 >39  mL/min    Comment: (NOTE) Calculated using the CKD-EPI Creatinine Equation (2021)    Anion gap 12 5 - 15    Comment: Performed at [facility], [address] [place], [place] [address]    Imaging / Studies: No results found.  Medications / Allergies: per chart  Antibiotics: Anti-infectives (From admission, onward)    Start     Dose/Rate Route Frequency Ordered Stop   12/17/23 0115  ceFAZolin  (ANCEF ) IVPB 2g/100 mL premix  2 g 200 mL/hr over 30 Minutes Intravenous  Once 12/17/23 0107 12/17/23 0235         Note: Portions of this report may have been transcribed using voice recognition software. Every effort was made to ensure accuracy; however, inadvertent computerized transcription errors may be present.   Any transcriptional errors that result from this process are unintentional.    Elspeth KYM Schultze, MD, FACS, MASCRS Esophageal, Gastrointestinal & Colorectal Surgery Robotic and Minimally Invasive Surgery  [facility] [address], [address] [place], [place] [address] [phone] Fax [phone] Main  CONTACT INFORMATION: Weekday (9AM-5PM): Call [facility] main office at [phone] Weeknight (5PM-9AM) or Weekend/Holiday: Check EPIC Web Links tab & use AMION (password  [email or web address]) for General Surgery [facility] coverage  Please, DO NOT use SecureChat  (it is not reliable communication to reach operating surgeons & will lead to a delay in care).   Epic staff messaging available for outpatient concerns needing 1-2 business day response.      12/22/2023  8:47 AM

## 2023-12-22 NOTE — Plan of Care (Signed)

## 2023-12-22 NOTE — Progress Notes (Signed)
SATURATION QUALIFICATIONS: (This note is used to comply with regulatory documentation for home oxygen)  Patient Saturations on Room Air at Rest =100%  Patient Saturations on Room Air while Ambulating = *97%  Patient Saturations on 2 Liters of oxygen while Ambulating = 99%  Please briefly explain why patient needs home oxygen:

## 2023-12-22 NOTE — Plan of Care (Signed)

## 2023-12-23 DIAGNOSIS — K56609 Unspecified intestinal obstruction, unspecified as to partial versus complete obstruction: Secondary | ICD-10-CM | POA: Diagnosis not present

## 2023-12-23 DIAGNOSIS — Z8679 Personal history of other diseases of the circulatory system: Secondary | ICD-10-CM | POA: Diagnosis not present

## 2023-12-23 LAB — BASIC METABOLIC PANEL WITH GFR
Anion gap: 12 (ref 5–15)
BUN: 12 mg/dL (ref 8–23)
CO2: 22 mmol/L (ref 22–32)
Calcium: 10.7 mg/dL — ABNORMAL HIGH (ref 8.9–10.3)
Chloride: 104 mmol/L (ref 98–111)
Creatinine, Ser: 0.53 mg/dL (ref 0.44–1.00)
GFR, Estimated: >60 mL/min (ref >60)
Glucose, Bld: 113 mg/dL — ABNORMAL HIGH (ref 70–99)
Potassium: 3.3 mmol/L — ABNORMAL LOW (ref 3.5–5.1)
Sodium: 139 mmol/L (ref 135–145)

## 2023-12-23 MED ORDER — POTASSIUM CHLORIDE CRYS ER 20 MEQ PO TBCR
40.0000 meq | EXTENDED_RELEASE_TABLET | Freq: Four times a day (QID) | ORAL | Status: AC
  Administered 2023-12-23 (×2): 40 meq via ORAL
  Filled 2023-12-23 (×2): qty 2

## 2023-12-23 MED ORDER — LOSARTAN POTASSIUM 50 MG PO TABS
50.0000 mg | ORAL_TABLET | Freq: Every day | ORAL | Status: DC
  Administered 2023-12-23 – 2023-12-28 (×6): 50 mg via ORAL
  Filled 2023-12-23 (×6): qty 1

## 2023-12-23 MED ORDER — AMLODIPINE BESYLATE 5 MG PO TABS
5.0000 mg | ORAL_TABLET | Freq: Every day | ORAL | Status: DC
  Administered 2023-12-23 – 2023-12-28 (×6): 5 mg via ORAL
  Filled 2023-12-23 (×6): qty 1

## 2023-12-23 NOTE — Progress Notes (Signed)
 PROGRESS NOTE    SELENI Walker  FMW:[id number] DOB: 1960-03-27 DOA: 12/16/2023 PCP: [name] Walker   Brief Narrative:  63 year old with past medical history signal for persistent asthma, GAD admitted 10/19 with a small bowel obstruction, initially presented complaining of abdominal pain.  CT omentum pelvis finding concerning for closed-loop small bowel obstruction in the mid hemiabdomen with 2 adjacent transition point and mesenteric swirling . Patient underwent exploratory laparotomy with LOA on 10/20 close loop bowel obstruction.  Assessment & Plan:   Small bowel obstruction, closed-loop, POA  - Status post ex lap and lysis of adhesions 12/17/2023 General Surgery following and managing. Pain appears to be reasonably well-controlled.  Patient is currently on full liquid diet.   Hypernatremia - Likely hypovolemic.  Improvement noted. Hypercalcemia- In the setting of dehydration,  resolved Hypophosphatemia: Repleted Hypokalemia: Continue to supplement as indicated.   Atypical Chest Pain:  -Likely GI in origin given burning and midepigastric in the setting of n.p.o. status and small bowel obstruction as above - Troponin remains negative, EKG without overt ST elevations  Acute hypoxic respiratory failure, secondary to bilateral pleural effusions -Likely in the setting of splinting given abdominal pain and pleural effusion Resolved.   Transient episode of asymptomatic VT; Replete electrolytes.  Continue to follow   Moderate persistent asthma; stable.  Essential hypertension High readings noted occasionally.  Patient is not on any antihypertensives currently.  Prior to admission patient was on amlodipine  and losartan .  This will be resumed.  Leukocytosis: Was reactive.  Now normal.   General anxiety disorder, well-controlled:  - Continue Ativan , Celexa , trazodone  once p.o. as tolerated; diazepam  as needed IV in the interim   Tobacco use disorder, -counseling provided   Thrombocytopenia; monitor, secondary to above.    Obesity Estimated body mass index is 30.08 kg/m as calculated from the following:   Height as of 12/07/23: 5' (1.524 m).   Weight as of 12/07/23: 69.9 kg.  DVT prophylaxis: enoxaparin (LOVENOX) injection 40 mg Start: 12/17/23 2200 SCDs Start: 12/17/23 0417 Code Status:   Code Status: Full Code Family Communication: At bedside Disposition: Hopefully home when cleared by general surgery.  Ambulate.  Consultants:  General surgery  Procedures:  Ex lap lysis of adhesions 10/20  Antimicrobials:  Perioperatively  Subjective: Patient has any abdominal pain nausea or vomiting.  Feels well for the most part.  Objective: Vitals:   12/22/23 2214 12/23/23 0448 12/23/23 0703 12/23/23 0744  BP: (!) 153/88  130/85   Pulse: 66  75   Resp: 16  16   Temp: 98.3 F (36.8 C)  98.3 F (36.8 C)   TempSrc:      SpO2: 98%  99% 97%  Weight:  72.6 kg      Intake/Output Summary (Last 24 hours) at 12/23/2023 1003 Last data filed at 12/23/2023 0600 Gross per 24 hour  Intake 1680 ml  Output 1200 ml  Net 480 ml   Filed Weights   12/19/23 0455 12/22/23 0500 12/23/23 0448  Weight: 70 kg 72.3 kg 72.6 kg    Examination:  General appearance: Awake alert.  In no distress Resp: Clear to auscultation bilaterally.  Normal effort Cardio: S1-S2 is normal regular.  No S3-S4.  No rubs murmurs or bruit GI: Abdomen is soft.  Nontender nondistended.  Bowel sounds are present normal.  No masses organomegaly Extremities: No edema.  Full range of motion of lower extremities. Neurologic: Alert and oriented x3.  No focal neurological deficits.    Data Reviewed:  CBC: Recent Labs  Lab 12/16/23 2232 12/17/23 0533 12/18/23 0439 12/19/23 0505 12/20/23 0448 12/22/23 0532  WBC 6.9 11.1* 5.7 5.6 8.3 7.8  NEUTROABS 4.8 9.4*  --   --   --   --   HGB 16.1* 13.7 11.9* 11.2* 11.5* 12.1  HCT 49.9* 41.9 38.9 35.7* 37.8 37.4  MCV 86.5 89.3 91.3 92.0 91.7  88.8  PLT 162 142* 111* 116* 117* 153   Basic Metabolic Panel: Recent Labs  Lab 12/17/23 0533 12/17/23 0827 12/18/23 0439 12/19/23 0505 12/20/23 0448 12/22/23 0532 12/22/23 0918 12/23/23 0903  NA 143  --  143 146* 146* 141  --  139  K 3.5  --  3.4* 3.8 3.4* 2.7*  --  3.3*  CL 110  --  110 111 109 104  --  104  CO2 21*  --  24 23 23 25   --  22  GLUCOSE 142*  --  99 83 86 77  --  113*  BUN 21  --  19 15 13 12   --  12  CREATININE 0.83  --  0.60 0.49 0.47 0.42*  --  0.53  CALCIUM  8.4*  --  8.7* 9.0 9.5 10.0  --  10.7*  MG 1.7  --  2.5* 2.1  --   --  1.8  --   PHOS  --  3.9 2.4* 2.0*  --   --  2.8  --    GFR: Estimated Creatinine Clearance: 64 mL/min (by C-G formula based on SCr of 0.53 mg/dL). Liver Function Tests: Recent Labs  Lab 12/17/23 0533  AST 23  ALT 14  ALKPHOS 71  BILITOT 0.6  PROT 6.4*  ALBUMIN 4.0    Radiology Studies: No results found.  Scheduled Meds:  acetaminophen   1,000 mg Oral Q6H   arformoterol  15 mcg Nebulization BID   budesonide (PULMICORT) nebulizer solution  0.25 mg Nebulization BID   Chlorhexidine  Gluconate Cloth  6 each Topical Daily   enoxaparin (LOVENOX) injection  40 mg Subcutaneous QHS   influenza vac split trivalent PF  0.5 mL Intramuscular Tomorrow-1000   ipratropium-albuterol   3 mL Nebulization BID   pantoprazole   40 mg Oral Q1200   pneumococcal 20-valent conjugate vaccine  0.5 mL Intramuscular Tomorrow-1000   polyethylene glycol  17 g Oral BID   Continuous Infusions:    LOS: 6 days   Joette Pebbles,  [facility]  If 7PM-7AM, please contact night-coverage [email or web address]  12/23/2023, 10:03 AM

## 2023-12-23 NOTE — Plan of Care (Signed)
  Problem: Clinical Measurements: Goal: Ability to maintain clinical measurements within normal limits will improve Outcome: Progressing   Problem: Activity: Goal: Risk for activity intolerance will decrease Outcome: Progressing   Problem: Nutrition: Goal: Adequate nutrition will be maintained Outcome: Progressing   Problem: Elimination: Goal: Will not experience complications related to bowel motility Outcome: Progressing   Problem: Pain Managment: Goal: General experience of comfort will improve and/or be controlled Outcome: Progressing   Problem: Safety: Goal: Ability to remain free from injury will improve Outcome: Progressing

## 2023-12-23 NOTE — Progress Notes (Signed)
 6 Days Post-Op   Subjective/Chief Complaint: Complains of soreness   Objective: Vital signs in last 24 hours: Temp:  [98.3 F (36.8 C)-98.4 F (36.9 C)] 98.3 F (36.8 C) (10/26 0703) Pulse Rate:  [60-75] 75 (10/26 0703) Resp:  [16-20] 16 (10/26 0703) BP: (130-153)/(85-90) 130/85 (10/26 0703) SpO2:  [97 %-100 %] 97 % (10/26 0744) FiO2 (%):  [21 %] 21 % (10/26 0744) Weight:  [72.6 kg] 72.6 kg (10/26 0448) Last BM Date : 12/15/23  Intake/Output from previous day: 10/25 0701 - 10/26 0700 In: 1680 [P.O.:1680] Out: 1200 [Urine:1200] Intake/Output this shift: No intake/output data recorded.  General appearance: alert and cooperative Resp: clear to auscultation bilaterally Cardio: regular rate and rhythm GI: soft, mild tenderness. distended  Lab Results:  Recent Labs    12/22/23 0532  WBC 7.8  HGB 12.1  HCT 37.4  PLT 153   BMET Recent Labs    12/22/23 0532 12/23/23 0903  NA 141 139  K 2.7* 3.3*  CL 104 104  CO2 25 22  GLUCOSE 77 113*  BUN 12 12  CREATININE 0.42* 0.53  CALCIUM  10.0 10.7*   PT/INR No results for input(s): LABPROT, INR in the last 72 hours. ABG No results for input(s): PHART, HCO3 in the last 72 hours.  Invalid input(s): PCO2, PO2  Studies/Results: No results found.  Anti-infectives: Anti-infectives (From admission, onward)    Start     Dose/Rate Route Frequency Ordered Stop   12/17/23 0115  ceFAZolin  (ANCEF ) IVPB 2g/100 mL premix        2 g 200 mL/hr over 30 Minutes Intravenous  Once 12/17/23 0107 12/17/23 0235       Assessment/Plan: s/p Procedure(s): LAPAROTOMY, EXPLORATORY (N/A) Continue full liquids until better return of bowel function Ambulate Ileus slowly improving POD 6 VTE - lovenox  LOS: 6 days    Deward Null III 12/23/2023

## 2023-12-24 ENCOUNTER — Inpatient Hospital Stay (HOSPITAL_COMMUNITY)

## 2023-12-24 DIAGNOSIS — Z8679 Personal history of other diseases of the circulatory system: Secondary | ICD-10-CM | POA: Diagnosis not present

## 2023-12-24 DIAGNOSIS — K56609 Unspecified intestinal obstruction, unspecified as to partial versus complete obstruction: Secondary | ICD-10-CM | POA: Diagnosis not present

## 2023-12-24 LAB — BASIC METABOLIC PANEL WITH GFR
Anion gap: 11 (ref 5–15)
BUN: 16 mg/dL (ref 8–23)
CO2: 25 mmol/L (ref 22–32)
Calcium: 10.7 mg/dL — ABNORMAL HIGH (ref 8.9–10.3)
Chloride: 105 mmol/L (ref 98–111)
Creatinine, Ser: 0.64 mg/dL (ref 0.44–1.00)
GFR, Estimated: >60 mL/min (ref >60)
Glucose, Bld: 129 mg/dL — ABNORMAL HIGH (ref 70–99)
Potassium: 4.6 mmol/L (ref 3.5–5.1)
Sodium: 141 mmol/L (ref 135–145)

## 2023-12-24 LAB — CBC
HCT: 42.1 % (ref 36.0–46.0)
Hemoglobin: 14.1 g/dL (ref 12.0–15.0)
MCH: 29 pg (ref 26.0–34.0)
MCHC: 33.5 g/dL (ref 30.0–36.0)
MCV: 86.4 fL (ref 80.0–100.0)
Platelets: 203 K/uL (ref 150–400)
RBC: 4.87 MIL/uL (ref 3.87–5.11)
RDW: 14.3 % (ref 11.5–15.5)
WBC: 10.2 K/uL (ref 4.0–10.5)
nRBC: 0 % (ref 0.0–0.2)

## 2023-12-24 LAB — MAGNESIUM: Magnesium: 1.6 mg/dL — ABNORMAL LOW (ref 1.7–2.4)

## 2023-12-24 MED ORDER — SODIUM CHLORIDE 0.9% FLUSH: 10.0000 mL | INTRAVENOUS | Status: DC | PRN

## 2023-12-24 MED ORDER — SODIUM CHLORIDE 0.9% FLUSH
10.0000 mL | Freq: Two times a day (BID) | INTRAVENOUS | Status: DC
  Administered 2023-12-25 (×2): 10 mL

## 2023-12-24 MED ORDER — MAGNESIUM SULFATE 4 GM/100ML IV SOLN
4.0000 g | Freq: Once | INTRAVENOUS | Status: AC
Start: 2023-12-24 — End: 2023-12-24
  Administered 2023-12-24: 4 g via INTRAVENOUS
  Filled 2023-12-24: qty 100

## 2023-12-24 NOTE — Progress Notes (Signed)
 PHARMACY - TOTAL PARENTERAL NUTRITION CONSULT NOTE   Indication: Prolonged ileus  Patient Measurements: Weight: 72.6 kg (160 lb 0.9 oz)   Body mass index is 31.26 kg/m.  Assessment:  63 YO female admitted 10/19 with abdominal pain. CT omentum pelvis finding concerning for closed-loop small bowel obstruction in the mid hemiabdomen. Patient underwent exploratory laparotomy with LOA on 10/20 close loop bowel obstruction. Surgery concerned for post-op ileus given emesis today and yesterday. Pharmacy consulted for TPN management.  Plan: TPN ordered after the noon cut-off time. Will order TPN labs for the morning, registered dietician consulted. Follow-up PICC placement and TPN start tomorrow, 10/28.    Lacinda Moats, PharmD Clinical Pharmacist  10/27/20252:53 PM

## 2023-12-24 NOTE — Progress Notes (Signed)
 PROGRESS NOTE    MACHELL WIRTHLIN  FMW:[id number] DOB: 03/11/1960 DOA: 12/16/2023 PCP: Norleen Lynwood ORN, MD   Brief Narrative:  63 year old with past medical history signal for persistent asthma, GAD admitted 10/19 with a small bowel obstruction, initially presented complaining of abdominal pain.  CT omentum pelvis finding concerning for closed-loop small bowel obstruction in the mid hemiabdomen with 2 adjacent transition point and mesenteric swirling . Patient underwent exploratory laparotomy with LOA on 10/20 close loop bowel obstruction.  Assessment & Plan:   Small bowel obstruction, closed-loop, POA  Status post ex lap and lysis of adhesions 12/17/2023 General Surgery following and managing. Pain is reasonably well-controlled.  However patient mentioned that she had 3 episodes of emesis in the last 24 hours.  Will wait for general surgery input.  Will get abdominal x-ray this morning.   Hypernatremia - Likely hypovolemic.  Improvement noted. Hypercalcemia- In the setting of dehydration,  resolved Hypophosphatemia: Repleted Hypokalemia: Continue to supplement as indicated. Hypomagnesemia: This will be supplemented.   Atypical Chest Pain:  -Likely GI in origin given burning and midepigastric in the setting of n.p.o. status and small bowel obstruction as above - Troponin remains negative, EKG without overt ST elevations  Acute hypoxic respiratory failure, secondary to bilateral pleural effusions Likely in the setting of splinting given abdominal pain and pleural effusion Resolved.   Transient episode of asymptomatic VT; Replete electrolytes.  Continue to follow   Moderate persistent asthma; stable.  Essential hypertension Patient's amlodipine  and ARB was resumed.  Blood pressure is better controlled now.    Leukocytosis: Was reactive.  Now normal.   General anxiety disorder, well-controlled:  Continue Ativan , Celexa , trazodone  once p.o. as tolerated; diazepam  as needed IV in  the interim   Tobacco use disorder, -counseling provided  Thrombocytopenia; monitor, secondary to above.    Obesity Estimated body mass index is 30.08 kg/m as calculated from the following:   Height as of 12/07/23: 5' (1.524 m).   Weight as of 12/07/23: 69.9 kg.  DVT prophylaxis: enoxaparin (LOVENOX) injection 40 mg Start: 12/17/23 2200 SCDs Start: 12/17/23 0417 Code Status:   Code Status: Full Code Family Communication: At bedside Disposition: Hopefully home when cleared by general surgery.  Ambulate.  Consultants:  General surgery  Procedures:  Ex lap lysis of adhesions 10/20  Antimicrobials:  Perioperatively  Subjective: Patient mentions 3 episodes of emesis in the last 24 hours.  Denies any worsening abdominal pain though she does mention that the abdomen feels more tight this morning.  Did have a bowel movement yesterday.  Objective: Vitals:   12/23/23 1928 12/23/23 2112 12/24/23 0611 12/24/23 0858  BP:  (!) 141/93 (!) 132/90   Pulse:  (!) 105 85   Resp:  19    Temp:  (!) 97.5 F (36.4 C) 98.6 F (37 C)   TempSrc:  Oral Oral   SpO2: 98% 100% 100% 99%  Weight:        Intake/Output Summary (Last 24 hours) at 12/24/2023 1017 Last data filed at 12/24/2023 1000 Gross per 24 hour  Intake 1080 ml  Output 200 ml  Net 880 ml   Filed Weights   12/19/23 0455 12/22/23 0500 12/23/23 0448  Weight: 70 kg 72.3 kg 72.6 kg    Examination:  General appearance: Awake alert.  In no distress Resp: Clear to auscultation bilaterally.  Normal effort Cardio: S1-S2 is normal regular.  No S3-S4.  No rubs murmurs or bruit GI: Abdomen is noted to be mildly distended this morning.  Incision site is clean.  Staples are noted.  Sluggish bowel sounds.  Tender to palpation. Extremities: No edema.  Full range of motion of lower extremities. Neurologic: Alert and oriented x3.  No focal neurological deficits.     Data Reviewed:   CBC: Recent Labs  Lab 12/18/23 0439  12/19/23 0505 12/20/23 0448 12/22/23 0532 12/24/23 0455  WBC 5.7 5.6 8.3 7.8 10.2  HGB 11.9* 11.2* 11.5* 12.1 14.1  HCT 38.9 35.7* 37.8 37.4 42.1  MCV 91.3 92.0 91.7 88.8 86.4  PLT 111* 116* 117* 153 203   Basic Metabolic Panel: Recent Labs  Lab 12/18/23 0439 12/19/23 0505 12/20/23 0448 12/22/23 0532 12/22/23 0918 12/23/23 0903 12/24/23 0455  NA 143 146* 146* 141  --  139 141  K 3.4* 3.8 3.4* 2.7*  --  3.3* 4.6  CL 110 111 109 104  --  104 105  CO2 24 23 23 25   --  22 25  GLUCOSE 99 83 86 77  --  113* 129*  BUN 19 15 13 12   --  12 16  CREATININE 0.60 0.49 0.47 0.42*  --  0.53 0.64  CALCIUM  8.7* 9.0 9.5 10.0  --  10.7* 10.7*  MG 2.5* 2.1  --   --  1.8  --  1.6*  PHOS 2.4* 2.0*  --   --  2.8  --   --    GFR: Estimated Creatinine Clearance: 64 mL/min (by C-G formula based on SCr of 0.64 mg/dL).   Radiology Studies: No results found.  Scheduled Meds:  acetaminophen   1,000 mg Oral Q6H   amLODipine   5 mg Oral Daily   arformoterol  15 mcg Nebulization BID   budesonide (PULMICORT) nebulizer solution  0.25 mg Nebulization BID   Chlorhexidine  Gluconate Cloth  6 each Topical Daily   enoxaparin (LOVENOX) injection  40 mg Subcutaneous QHS   influenza vac split trivalent PF  0.5 mL Intramuscular Tomorrow-1000   ipratropium-albuterol   3 mL Nebulization BID   losartan   50 mg Oral Daily   pantoprazole   40 mg Oral Q1200   pneumococcal 20-valent conjugate vaccine  0.5 mL Intramuscular Tomorrow-1000   polyethylene glycol  17 g Oral BID   Continuous Infusions:    LOS: 7 days   Joette Pebbles,  [facility]  If 7PM-7AM, please contact night-coverage [email or web address]  12/24/2023, 10:17 AM

## 2023-12-24 NOTE — Plan of Care (Signed)
   Problem: Education: Goal: Knowledge of General Education information will improve Description: Including pain rating scale, medication(s)/side effects and non-pharmacologic comfort measures Outcome: Progressing   Problem: Activity: Goal: Risk for activity intolerance will decrease Outcome: Progressing

## 2023-12-24 NOTE — Progress Notes (Signed)
   General Surgery Follow Up Note  Subjective:    Overnight Issues:   Objective:  Vital signs for last 24 hours: Temp:  [97.5 F (36.4 C)-98.6 F (37 C)] 98.6 F (37 C) (10/27 9388) Pulse Rate:  [83-105] 85 (10/27 0611) Resp:  [18-19] 19 (10/26 2112) BP: (132-141)/(87-96) 132/90 (10/27 0611) SpO2:  [98 %-100 %] 99 % (10/27 0858) FiO2 (%):  [21 %] 21 % (10/26 1928)  Hemodynamic parameters for last 24 hours:    Intake/Output from previous day: 10/26 0701 - 10/27 0700 In: 1320 [P.O.:1320] Out: 200 [Urine:200]  Intake/Output this shift: Total I/O In: 120 [P.O.:120] Out: -   Vent settings for last 24 hours: FiO2 (%):  [21 %] 21 %  Physical Exam:  Gen: comfortable, no distress Neuro: follows commands, alert, communicative HEENT: PERRL Neck: supple CV: RRR Pulm: unlabored breathing on RA Abd: soft, NT, incision clean, dry, intact  GU: urine clear and yellow, +spontaneous void Extr: wwp, no edema  Results for orders placed or performed during the hospital encounter of 12/16/23 (from the past 24 hours)  Basic metabolic panel with GFR     Status: Abnormal   Collection Time: 12/24/23  4:55 AM  Result Value Ref Range   Sodium 141 135 - 145 mmol/L   Potassium 4.6 3.5 - 5.1 mmol/L   Chloride 105 98 - 111 mmol/L   CO2 25 22 - 32 mmol/L   Glucose, Bld 129 (H) 70 - 99 mg/dL   BUN 16 8 - 23 mg/dL   Creatinine, Ser 9.35 0.44 - 1.00 mg/dL   Calcium  10.7 (H) 8.9 - 10.3 mg/dL   GFR, Estimated >39 >39 mL/min   Anion gap 11 5 - 15  CBC     Status: None   Collection Time: 12/24/23  4:55 AM  Result Value Ref Range   WBC 10.2 4.0 - 10.5 K/uL   RBC 4.87 3.87 - 5.11 MIL/uL   Hemoglobin 14.1 12.0 - 15.0 g/dL   HCT 57.8 63.9 - 53.9 %   MCV 86.4 80.0 - 100.0 fL   MCH 29.0 26.0 - 34.0 pg   MCHC 33.5 30.0 - 36.0 g/dL   RDW 85.6 88.4 - 84.4 %   Platelets 203 150 - 400 K/uL   nRBC 0.0 0.0 - 0.2 %  Magnesium     Status: Abnormal   Collection Time: 12/24/23  4:55 AM  Result Value  Ref Range   Magnesium 1.6 (L) 1.7 - 2.4 mg/dL    Assessment & Plan:  Present on Admission:  Small bowel obstruction (HCC)  Generalized abdominal pain  Nausea  Hypercalcemia  Moderate persistent asthma  GAD (generalized anxiety disorder)  Tobacco abuse    LOS: 7 days   Additional comments:I reviewed the patient's new clinical lab test results.   and I reviewed the patients new imaging test results.    s/p Procedure(s): LAPAROTOMY, EXPLORATORY (N/A) Ambulate Ileus was slowly improving, emesis this AM and yest, XR abd P, back down to npo except sips/chips. Based on XR, may need to start TPN POD7  FEN - NPO except sips/chips, replete hypomagnesemia DVT - SCDs, LMWH Dispo - med-surg     Dreama GEANNIE Hanger, MD Trauma & General Surgery Please use [email or web address] to contact on call provider  12/24/2023  *Care during the described time interval was provided by me. I have reviewed this patient's available data, including medical history, events of note, physical examination and test results as part of my evaluation.

## 2023-12-25 ENCOUNTER — Inpatient Hospital Stay (HOSPITAL_COMMUNITY)

## 2023-12-25 ENCOUNTER — Other Ambulatory Visit: Payer: Self-pay

## 2023-12-25 DIAGNOSIS — K56609 Unspecified intestinal obstruction, unspecified as to partial versus complete obstruction: Secondary | ICD-10-CM | POA: Diagnosis not present

## 2023-12-25 LAB — COMPREHENSIVE METABOLIC PANEL WITH GFR
ALT: 35 U/L (ref 0–44)
AST: 45 U/L — ABNORMAL HIGH (ref 15–41)
Albumin: 3.3 g/dL — ABNORMAL LOW (ref 3.5–5.0)
Alkaline Phosphatase: 76 U/L (ref 38–126)
Anion gap: 10 (ref 5–15)
BUN: 13 mg/dL (ref 8–23)
CO2: 24 mmol/L (ref 22–32)
Calcium: 9.6 mg/dL (ref 8.9–10.3)
Chloride: 104 mmol/L (ref 98–111)
Creatinine, Ser: 0.59 mg/dL (ref 0.44–1.00)
GFR, Estimated: >60 mL/min (ref >60)
Glucose, Bld: 83 mg/dL (ref 70–99)
Potassium: 3.7 mmol/L (ref 3.5–5.1)
Sodium: 138 mmol/L (ref 135–145)
Total Bilirubin: 0.8 mg/dL (ref 0.0–1.2)
Total Protein: 5.9 g/dL — ABNORMAL LOW (ref 6.5–8.1)

## 2023-12-25 LAB — GLUCOSE, CAPILLARY
Glucose-Capillary: 139 mg/dL — ABNORMAL HIGH (ref 70–99)
Glucose-Capillary: 86 mg/dL (ref 70–99)

## 2023-12-25 LAB — PHOSPHORUS: Phosphorus: 3.3 mg/dL (ref 2.5–4.6)

## 2023-12-25 LAB — MAGNESIUM: Magnesium: 1.7 mg/dL (ref 1.7–2.4)

## 2023-12-25 MED ORDER — POTASSIUM CHLORIDE 10 MEQ/100ML IV SOLN
10.0000 meq | INTRAVENOUS | Status: AC
  Administered 2023-12-25 (×3): 10 meq via INTRAVENOUS
  Filled 2023-12-25 (×2): qty 100

## 2023-12-25 MED ORDER — INSULIN ASPART 100 UNIT/ML IJ SOLN
0.0000 [IU] | Freq: Three times a day (TID) | INTRAMUSCULAR | Status: DC
  Administered 2023-12-26 (×2): 1 [IU] via SUBCUTANEOUS

## 2023-12-25 MED ORDER — SODIUM CHLORIDE 0.9% FLUSH
10.0000 mL | Freq: Two times a day (BID) | INTRAVENOUS | Status: DC
  Administered 2023-12-26: 10 mL

## 2023-12-25 MED ORDER — BOOST / RESOURCE BREEZE PO LIQD CUSTOM
1.0000 | Freq: Three times a day (TID) | ORAL | Status: DC
  Administered 2023-12-25 – 2023-12-26 (×5): 1 via ORAL

## 2023-12-25 MED ORDER — MAGNESIUM SULFATE 2 GM/50ML IV SOLN
2.0000 g | Freq: Once | INTRAVENOUS | Status: AC
  Administered 2023-12-25: 2 g via INTRAVENOUS
  Filled 2023-12-25: qty 50

## 2023-12-25 MED ORDER — SODIUM CHLORIDE 0.9% FLUSH: 10.0000 mL | INTRAVENOUS | Status: DC | PRN

## 2023-12-25 MED ORDER — THIAMINE MONONITRATE 100 MG PO TABS
100.0000 mg | ORAL_TABLET | Freq: Every day | ORAL | Status: DC
  Administered 2023-12-25 – 2023-12-28 (×4): 100 mg via ORAL
  Filled 2023-12-25 (×4): qty 1

## 2023-12-25 MED ORDER — TRAVASOL 10 % IV SOLN
INTRAVENOUS | Status: AC
  Filled 2023-12-25: qty 480

## 2023-12-25 NOTE — Progress Notes (Signed)
 PICC order noted, will plan for insertion this afternoon for TPN.

## 2023-12-25 NOTE — Progress Notes (Signed)
 Initial Nutrition Assessment  DOCUMENTATION CODES:   Obesity unspecified  INTERVENTION:  - TPN to start tonight at 12mL/hr.  - TPN management per pharmacy.   - Monitor magnesium, potassium, and phosphorus daily for at least 3 days, MD to replete as needed, as pt is at risk for refeeding syndrome given inadequate intake since admit 9 days ago. - Add 100mg  thiamine x5 days.  - Daily weights while on TPN.   - Clear Liquid diet per Surgery.  - Boost Breeze po TID, each supplement provides 250 kcal and 9 grams of protein   NUTRITION DIAGNOSIS:   Inadequate oral intake related to acute illness, altered GI function (small bowel obstruction) as evidenced by energy intake < 75% for > 7 days.  GOAL:   Patient will meet greater than or equal to 90% of their needs  MONITOR:   PO intake, Supplement acceptance, Diet advancement, Labs, Weight trends  REASON FOR ASSESSMENT:   Consult New TPN/TNA  ASSESSMENT:   63 y.o. female with PMH significant for persistent asthma and GAD who presented complaining of abdominal pain and admitted for small bowel obstruction.   10/19 Admit; NPO; NGT placed 10/24 NGT removed; CLD 10/25 FLD 10/27 NPO 10/28 CLD; starting TPN  Patient in bedside chair at time of visit.  She reports a UBW of 150# and dnies any significant weight changes over the past year until possibly this admission.  Per EMR, weight stable over the past year. Current weight is up from admit weight however suspect this is fluid related.  Patient reports eating normally up until symptoms started that caused her to present to the hospital. Typically only eats 1 meal a day, occasionally has a snack as well.   Her intake has been limited since admission due to restrictive diets. She is documented to have had 100% of lunch on 10/25 and 100% of dinner on 10/26. Has not been on supplements until Boost Breeze was added today by Surgery. Patient does not feel like she has tried this yet. At  end of visit patient received phone call, will attempt NFPE at next visit.  Discussed plan for TPN with patient who endorsed understanding.  Pharmacy ordered TPN to start tonight at 40mL/hr. Per pharmacy, goal is 58mL/hr. Patient likely at risk of refeeding syndrome. Will add thiamine.   Admit weight: 154# Current weight: 159# I&O's: +2.1L + for right and left lower extremity edema  Medications reviewed and include: Protonix , Miralax  Labs reviewed:  -  NUTRITION - FOCUSED PHYSICAL EXAM:  Will attempt at next visit  Diet Order:   Diet Order             Diet clear liquid Fluid consistency: Thin  Diet effective now                   EDUCATION NEEDS:  Education needs have been addressed  Skin:  Skin Assessment: Reviewed RN Assessment  Last BM:  10/27 - type 7  Height:  Ht Readings from Last 1 Encounters:  12/25/23 5' (1.524 m)   Weight:  Wt Readings from Last 1 Encounters:  12/25/23 72.5 kg   Ideal Body Weight:  45.45 kg  BMI:  Body mass index is 31.22 kg/m.  Estimated Nutritional Needs:  Kcal:  1550-1750 kcals Protein:  70-85 grams Fluid:  >/= 1.6L    Trude Ned RD, LDN Contact via Secure Chat.

## 2023-12-25 NOTE — TOC Progression Note (Signed)
 Transition of Care [facility]) - Progression Note    Patient Details  Name: [name] Walker MRN: [id number] Date of Birth: [birth year]  [facility]) CM/SW Contact  NORMAN ASPEN, LCSW Phone Number: 12/25/2023, 2:07 PM  Clinical Narrative:      IP CM continues to monitor for any potential dc needs.       Expected Discharge Plan and Services                                               Social Drivers of Health (SDOH) Interventions SDOH Screenings   Food Insecurity: No Food Insecurity (12/17/2023)  Housing: Low Risk  (12/17/2023)  Transportation Needs: No Transportation Needs (12/17/2023)  Utilities: Not At Risk (12/17/2023)  Alcohol Screen: Low Risk  (03/21/2023)  Depression (PHQ2-9): Low Risk  (12/07/2023)  Financial Resource Strain: Low Risk  (03/21/2023)  Physical Activity: Inactive (03/21/2023)  Social Connections: Moderately Isolated (03/21/2023)  Stress: No Stress Concern Present (03/21/2023)  Tobacco Use: High Risk (12/17/2023)  Health Literacy: Adequate Health Literacy (03/21/2023)    Readmission Risk Interventions    12/17/2023    4:46 PM  Readmission Risk Prevention Plan  Post Dischage Appt Complete  Medication Screening Complete  Transportation Screening Complete

## 2023-12-25 NOTE — Progress Notes (Signed)
 Peripherally Inserted Central Catheter Placement  The IV Nurse has discussed with the patient and/or persons authorized to consent for the patient, the purpose of this procedure and the potential benefits and risks involved with this procedure.  The benefits include less needle sticks, lab draws from the catheter, and the patient may be discharged home with the catheter. Risks include, but not limited to, infection, bleeding, blood clot (thrombus formation), and puncture of an artery; nerve damage and irregular heartbeat and possibility to perform a PICC exchange if needed/ordered by physician.  Alternatives to this procedure were also discussed.  Bard Power PICC patient education guide, fact sheet on infection prevention and patient information card has been provided to patient /or left at bedside.    PICC Placement Documentation  PICC Double Lumen 12/25/23 Right Basilic 37 cm 1 cm (Active)  Indication for Insertion or Continuance of Line Administration of hyperosmolar/irritating solutions (i.e. TPN, Vancomycin, etc.) 12/25/23 1429  Site Assessment Clean, Dry, Intact 12/25/23 1429  Lumen #1 Status Flushed;Saline locked;Blood return noted 12/25/23 1429  Lumen #2 Status Flushed;Blood return noted;Saline locked 12/25/23 1429  Dressing Type Transparent;Securing device 12/25/23 1429  Dressing Status Antimicrobial disc/dressing in place;Clean, Dry, Intact 12/25/23 1429  Line Care Connections checked and tightened 12/25/23 1429  Line Adjustment (NICU/IV Team Only) No 12/25/23 1429  Dressing Change Due 01/01/24 12/25/23 1429       Bonni Rock Larve 12/25/2023, 2:31 PM

## 2023-12-25 NOTE — Progress Notes (Signed)
 PHARMACY - TOTAL PARENTERAL NUTRITION CONSULT NOTE   Indication: Prolonged ileus  Patient Measurements: Weight: 72.6 kg (160 lb 0.9 oz)   Body mass index is 31.26 kg/m. Usual Weight:   Assessment: 63 YO female admitted 10/19 with abdominal pain. CT omentum pelvis finding concerning for closed-loop small bowel obstruction in the mid hemiabdomen. Patient underwent exploratory laparotomy with LOA on 10/20 close loop bowel obstruction. Surgery concerned for post-op ileus given emesis.  Pharmacy consulted for TPN management.   Glucose / Insulin: No hx DM, glucose 83 with AM labs.  Electrolytes: WNL, but will aim for higher goal K > 4 and Mag > 2 due to ileus.  Renal: SCr 0.59, BUN WNL Hepatic: AST 45.  Alk Phos, ALT, Tbili WNL. Albumin 3.3 Intake / Output; MIVF:  - Intake: no mIVF - Output: Urine x8, stool x3, emesis not charted GI Imaging: 10/27 Abd Xray: postoperative ileus GI Surgeries / Procedures:  10/20 Exploratory laparotomy with LOA  Central access: PICC (ordered 10/28) TPN start date: 10/28   Nutritional Goals: Goal TPN rate is 70 mL/hr to provide 84 g of protein and 1,697 kcals per day  RD Assessment:     Current Nutrition:  Clear liquids  Plan:  Now:   - KCl 10 mEq IV x3 runs - Mag 2g IV  At 18:00 Start TPN at 40 mL/hr Electrolytes in TPN: Na 63mEq/L, K 1mEq/L, Ca 20mEq/L, Mg 68mEq/L, and Phos 15mmol/L. Cl:Ac 1:1 Add standard MVI and trace elements to TPN Initiate Sensitive q8h SSI and adjust as needed  Monitor TPN labs on Mon/Thurs, and prn  Wanda Hasting PharmD, BCPS [facility] main pharmacy [phone] 12/25/2023 7:53 AM

## 2023-12-25 NOTE — Progress Notes (Addendum)
  8 Days Post-Op   Chief Complaint/Subjective: +BMs overnight, abdominal pain more controlled, no nausea or vomiting  Objective: Vital signs in last 24 hours: Temp:  [97.8 F (36.6 C)-98.4 F (36.9 C)] 98.4 F (36.9 C) (10/28 0553) Pulse Rate:  [70-95] 70 (10/28 0553) Resp:  [16-18] 18 (10/28 0553) BP: (121-133)/(81-90) 133/83 (10/28 0553) SpO2:  [97 %-100 %] 99 % (10/28 0810) Weight:  [72.5 kg] 72.5 kg (10/28 0721) Last BM Date : 12/15/23 Intake/Output from previous day: 10/27 0701 - 10/28 0700 In: 525.1 [P.O.:420; I.V.:5.1; IV Piggyback:100] Out: -   PE: Gen: NAD Resp: nonlabored Card: RRR Abd: soft, incision c/d/I, nontender  Lab Results:  Recent Labs    12/24/23 0455  WBC 10.2  HGB 14.1  HCT 42.1  PLT 203   Recent Labs    12/24/23 0455 12/25/23 0451  NA 141 138  K 4.6 3.7  CL 105 104  CO2 25 24  GLUCOSE 129* 83  BUN 16 13  CREATININE 0.64 0.59  CALCIUM  10.7* 9.6   No results for input(s): LABPROT, INR in the last 72 hours.    Component Value Date/Time   NA 138 12/25/2023 0451   K 3.7 12/25/2023 0451   CL 104 12/25/2023 0451   CO2 24 12/25/2023 0451   GLUCOSE 83 12/25/2023 0451   BUN 13 12/25/2023 0451   CREATININE 0.59 12/25/2023 0451   CREATININE 0.58 09/23/2019 1128   CALCIUM  9.6 12/25/2023 0451   PROT 5.9 (L) 12/25/2023 0451   ALBUMIN 3.3 (L) 12/25/2023 0451   AST 45 (H) 12/25/2023 0451   ALT 35 12/25/2023 0451   ALKPHOS 76 12/25/2023 0451   BILITOT 0.8 12/25/2023 0451   GFRNONAA >60 12/25/2023 0451   GFRAA >60 09/30/2019 1202    Assessment/Plan  s/p Procedure(s): LAPAROTOMY, EXPLORATORY 12/17/2023 (lysis of adhesions)   Ileus with vomiting yesterday morning, +BM overnight -XR this am FEN - clear liquids/breeze today VTE - lovenox ID - periop Disposition - med-surg   LOS: 8 days   I reviewed last 24 h vitals and pain scores, last 48 h intake and output, last 24 h labs and trends, and last 24 h imaging results.  This  care required moderate level of medical decision making.   [name] Walker [facility] 12/25/2023, 8:15 AM Please see Amion for pager number during day hours 7:00am-4:30pm or 7:00am -11:30am on weekends  -XR with worsened gas throughout small bowel and stomach -Plan for PICC placement for TPN

## 2023-12-25 NOTE — Progress Notes (Signed)
 PROGRESS NOTE    [name] Walker  FMW:[id number] DOB: 1960/04/28 DOA: 12/16/2023 PCP: Norleen Lynwood ORN, MD   Brief Narrative:  63 year old with past medical history signal for persistent asthma, GAD admitted 10/19 with a small bowel obstruction, initially presented complaining of abdominal pain.  CT omentum pelvis finding concerning for closed-loop small bowel obstruction in the mid hemiabdomen with 2 adjacent transition point and mesenteric swirling . Patient underwent exploratory laparotomy with LOA on 10/20 close loop bowel obstruction.  Assessment & Plan:   Small bowel obstruction, closed-loop, POA  Status post ex lap and lysis of adhesions 12/17/2023 General Surgery following and managing. Pain is well-controlled however patient did have episodes of emesis on 10/26.  Did have bowel movements yesterday.  Feels better today compared to yesterday. X-ray from this morning shows small bowel and gastric distention. General surgery is following and managing.  Plan is for TPN.   Hypernatremia - Likely hypovolemic.  Improvement noted. Hypercalcemia- In the setting of dehydration,  resolved Hypophosphatemia: Repleted Hypokalemia: Continue to supplement as indicated. Hypomagnesemia: Continue to monitor and supplement as indicated   Atypical Chest Pain:  -Likely GI in origin given burning and midepigastric in the setting of n.p.o. status and small bowel obstruction as above Troponins were normal.  Acute hypoxic respiratory failure, secondary to bilateral pleural effusions Likely in the setting of splinting given abdominal pain and pleural effusion Resolved.   Transient episode of asymptomatic VT; Replete electrolytes.  Continue to follow   Moderate persistent asthma; stable.  Essential hypertension Patient's amlodipine  and ARB was resumed.  Blood pressure is better controlled now.    Leukocytosis: Was reactive.  Now normal.   General anxiety disorder, well-controlled:  Continue  Ativan , Celexa , trazodone  once p.o. as tolerated; diazepam  as needed IV in the interim   Tobacco use disorder, -counseling provided  Thrombocytopenia; monitor, secondary to above.    Obesity Estimated body mass index is 30.08 kg/m as calculated from the following:   Height as of 12/07/23: 5' (1.524 m).   Weight as of 12/07/23: 69.9 kg.  DVT prophylaxis: enoxaparin (LOVENOX) injection 40 mg Start: 12/17/23 2200 SCDs Start: 12/17/23 0417 Code Status:   Code Status: Full Code Family Communication: At bedside Disposition: Hopefully home when cleared by general surgery.  Ambulate.  Consultants:  General surgery  Procedures:  Ex lap lysis of adhesions 10/20  Antimicrobials:  Perioperatively  Subjective: Patient mentioned that she had several bowel movements yesterday.  Feels better today compared to yesterday.  No shortness of breath.    Objective: Vitals:   12/24/23 2046 12/25/23 0553 12/25/23 0721 12/25/23 0810  BP: 131/81 133/83    Pulse: 95 70    Resp: 18 18    Temp: 97.8 F (36.6 C) 98.4 F (36.9 C)    TempSrc: Oral Oral    SpO2: 97% 100%  99%  Weight:   72.5 kg   Height:   5' (1.524 m)     Intake/Output Summary (Last 24 hours) at 12/25/2023 0946 Last data filed at 12/25/2023 0851 Gross per 24 hour  Intake 535.1 ml  Output --  Net 535.1 ml   Filed Weights   12/22/23 0500 12/23/23 0448 12/25/23 0721  Weight: 72.3 kg 72.6 kg 72.5 kg    Examination:  General appearance: Awake alert.  In no distress Resp: Clear to auscultation bilaterally.  Normal effort Cardio: S1-S2 is normal regular.  No S3-S4.  No rubs murmurs or bruit    Data Reviewed:   CBC: Recent Labs  Lab 12/19/23 0505 12/20/23 0448 12/22/23 0532 12/24/23 0455  WBC 5.6 8.3 7.8 10.2  HGB 11.2* 11.5* 12.1 14.1  HCT 35.7* 37.8 37.4 42.1  MCV 92.0 91.7 88.8 86.4  PLT 116* 117* 153 203   Basic Metabolic Panel: Recent Labs  Lab 12/19/23 0505 12/20/23 0448 12/22/23 0532 12/22/23 0918  12/23/23 0903 12/24/23 0455 12/25/23 0451  NA 146* 146* 141  --  139 141 138  K 3.8 3.4* 2.7*  --  3.3* 4.6 3.7  CL 111 109 104  --  104 105 104  CO2 23 23 25   --  22 25 24   GLUCOSE 83 86 77  --  113* 129* 83  BUN 15 13 12   --  12 16 13   CREATININE 0.49 0.47 0.42*  --  0.53 0.64 0.59  CALCIUM  9.0 9.5 10.0  --  10.7* 10.7* 9.6  MG 2.1  --   --  1.8  --  1.6* 1.7  PHOS 2.0*  --   --  2.8  --   --  3.3   GFR: Estimated Creatinine Clearance: 64 mL/min (by C-G formula based on SCr of 0.59 mg/dL).   Radiology Studies: US  EKG SITE RITE Result Date: 12/25/2023 If Site Rite image not attached, placement could not be confirmed due to current cardiac rhythm.  DG Abd 1 View Result Date: 12/25/2023 EXAM: 1 VIEW XRAY OF THE ABDOMEN 12/25/2023 08:30:00 AM COMPARISON: Comparison 12/24/2023. CLINICAL HISTORY: FINDINGS: LINES, TUBES AND DEVICES: Midline surgical staples are again noted. BOWEL: Increased gastric distention is noted. Increased small bowel dilatation is noted most consistent with postoperative ileus. No significant colonic dilatation is noted. SOFT TISSUES: No opaque urinary calculi. BONES: No acute osseous abnormality. IMPRESSION: 1. Postoperative ileus with increased gastric distention and small bowel dilatation. 2. No significant colonic dilatation. Electronically signed by: Lynwood Seip MD 12/25/2023 09:13 AM EDT RP Workstation: [id number]   DG Abd Portable 1V Result Date: 12/24/2023 EXAM: 1 VIEW XRAY OF THE ABDOMEN 12/24/2023 10:59:00 AM COMPARISON: 12/18/2023 CLINICAL HISTORY: FINDINGS: BOWEL: Mildly dilated mid and left abdominal small bowel loops and a few in the proximal to mid ascending colon consistent with postoperative ileus. SOFT TISSUES: Midline laparotomy staples noted. No opaque urinary calculi. BONES: No acute osseous abnormality. IMPRESSION: 1. Findings consistent with postoperative ileus. Electronically signed by: Norleen Boxer MD 12/24/2023 11:29 AM EDT RP Workstation:  [id number]    Scheduled Meds:  acetaminophen   1,000 mg Oral Q6H   amLODipine   5 mg Oral Daily   arformoterol  15 mcg Nebulization BID   budesonide (PULMICORT) nebulizer solution  0.25 mg Nebulization BID   Chlorhexidine  Gluconate Cloth  6 each Topical Daily   enoxaparin (LOVENOX) injection  40 mg Subcutaneous QHS   feeding supplement  1 Container Oral TID BM   influenza vac split trivalent PF  0.5 mL Intramuscular Tomorrow-1000   insulin aspart  0-9 Units Subcutaneous Q8H   losartan   50 mg Oral Daily   pantoprazole   40 mg Oral Q1200   pneumococcal 20-valent conjugate vaccine  0.5 mL Intramuscular Tomorrow-1000   polyethylene glycol  17 g Oral BID   sodium chloride  flush  10-40 mL Intracatheter Q12H   Continuous Infusions:  magnesium sulfate bolus IVPB 2 g (12/25/23 0901)   potassium chloride  10 mEq (12/25/23 0903)     LOS: 8 days   Joette Pebbles,  [facility]  If 7PM-7AM, please contact night-coverage [email or web address]  12/25/2023, 9:46 AM

## 2023-12-25 NOTE — Progress Notes (Signed)
 Mobility Specialist Progress Note:   12/25/23 0915  Mobility  Activity Ambulated independently  Level of Assistance Standby assist, set-up cues, supervision of patient - no hands on  Assistive Device None  Distance Ambulated (ft) 400 ft  Activity Response Tolerated well  Mobility Referral Yes  Mobility visit 1 Mobility  Mobility Specialist Start Time (ACUTE ONLY) 0845  Mobility Specialist Stop Time (ACUTE ONLY) 0858  Mobility Specialist Time Calculation (min) (ACUTE ONLY) 13 min   Pt was received in bed and agreed to mobility. No complaints during ambulation. Returned to chair with all needs met. Left in room with RN.  [employer] - Mobility Specialist

## 2023-12-26 ENCOUNTER — Inpatient Hospital Stay (HOSPITAL_COMMUNITY)

## 2023-12-26 DIAGNOSIS — K56609 Unspecified intestinal obstruction, unspecified as to partial versus complete obstruction: Secondary | ICD-10-CM | POA: Diagnosis not present

## 2023-12-26 LAB — COMPREHENSIVE METABOLIC PANEL WITH GFR
ALT: 46 U/L — ABNORMAL HIGH (ref 0–44)
AST: 44 U/L — ABNORMAL HIGH (ref 15–41)
Albumin: 3.4 g/dL — ABNORMAL LOW (ref 3.5–5.0)
Alkaline Phosphatase: 78 U/L (ref 38–126)
Anion gap: 9 (ref 5–15)
BUN: 6 mg/dL — ABNORMAL LOW (ref 8–23)
CO2: 24 mmol/L (ref 22–32)
Calcium: 9.2 mg/dL (ref 8.9–10.3)
Chloride: 107 mmol/L (ref 98–111)
Creatinine, Ser: 0.52 mg/dL (ref 0.44–1.00)
GFR, Estimated: >60 mL/min (ref >60)
Glucose, Bld: 105 mg/dL — ABNORMAL HIGH (ref 70–99)
Potassium: 3.3 mmol/L — ABNORMAL LOW (ref 3.5–5.1)
Sodium: 139 mmol/L (ref 135–145)
Total Bilirubin: 0.5 mg/dL (ref 0.0–1.2)
Total Protein: 5.7 g/dL — ABNORMAL LOW (ref 6.5–8.1)

## 2023-12-26 LAB — CBC
HCT: 34.7 % — ABNORMAL LOW (ref 36.0–46.0)
Hemoglobin: 11.3 g/dL — ABNORMAL LOW (ref 12.0–15.0)
MCH: 28.9 pg (ref 26.0–34.0)
MCHC: 32.6 g/dL (ref 30.0–36.0)
MCV: 88.7 fL (ref 80.0–100.0)
Platelets: 158 K/uL (ref 150–400)
RBC: 3.91 MIL/uL (ref 3.87–5.11)
RDW: 14.8 % (ref 11.5–15.5)
WBC: 7.3 K/uL (ref 4.0–10.5)
nRBC: 0 % (ref 0.0–0.2)

## 2023-12-26 LAB — MAGNESIUM: Magnesium: 2.1 mg/dL (ref 1.7–2.4)

## 2023-12-26 LAB — GLUCOSE, CAPILLARY
Glucose-Capillary: 119 mg/dL — ABNORMAL HIGH (ref 70–99)
Glucose-Capillary: 112 mg/dL — ABNORMAL HIGH (ref 70–99)
Glucose-Capillary: 127 mg/dL — ABNORMAL HIGH (ref 70–99)

## 2023-12-26 LAB — PHOSPHORUS: Phosphorus: 2.8 mg/dL (ref 2.5–4.6)

## 2023-12-26 MED ORDER — TRAVASOL 10 % IV SOLN
INTRAVENOUS | Status: AC
  Filled 2023-12-26: qty 480

## 2023-12-26 MED ORDER — POTASSIUM CHLORIDE CRYS ER 20 MEQ PO TBCR
20.0000 meq | EXTENDED_RELEASE_TABLET | Freq: Once | ORAL | Status: AC
  Administered 2023-12-26: 20 meq via ORAL
  Filled 2023-12-26: qty 1

## 2023-12-26 MED ORDER — POTASSIUM CHLORIDE CRYS ER 20 MEQ PO TBCR
40.0000 meq | EXTENDED_RELEASE_TABLET | Freq: Once | ORAL | Status: AC
  Administered 2023-12-26: 40 meq via ORAL
  Filled 2023-12-26: qty 2

## 2023-12-26 MED ORDER — POTASSIUM PHOSPHATES 15 MMOLE/5ML IV SOLN
15.0000 mmol | Freq: Once | INTRAVENOUS | Status: AC
  Administered 2023-12-26: 15 mmol via INTRAVENOUS
  Filled 2023-12-26: qty 5

## 2023-12-26 MED ORDER — LORAZEPAM 1 MG PO TABS
1.0000 mg | ORAL_TABLET | Freq: Two times a day (BID) | ORAL | Status: DC | PRN
  Administered 2023-12-26 – 2023-12-27 (×4): 1 mg via ORAL
  Filled 2023-12-26 (×4): qty 1

## 2023-12-26 NOTE — Progress Notes (Signed)
 PROGRESS NOTE    [name] Walker  FMW:[id number] DOB: [birth year] DOA: 12/16/2023 PCP: Norleen Lynwood ORN, MD   Brief Narrative: 63 year old with past medical history significant for persistent asthma, GAD, admitted 10/19 with a small bowel obstruction, initially presented complaining with abdominal pain.  C showed: Omentum pelvis finding concerning for closed-loop small bowel obstruction in the mid hemiabdomen with 2 adjacent transition point and mesenteric swirling.  Patient underwent exploratory laparotomy with LOA 10/20 closed-loop bowel obstruction.   Assessment & Plan:   Principal Problem:   Small bowel obstruction (HCC) Active Problems:   Tobacco abuse   Generalized abdominal pain   Nausea   Hypercalcemia   History of essential hypertension   Moderate persistent asthma   GAD (generalized anxiety disorder)   1-SBO, closed-loop, POA: - Status post exploratory laparotomy and lysis of adhesion in 12/17/2023 - General Surgery following and managing - Started on TPN -plan to advanced to full liquid diet today.  Sx considering repeating CT scan if unable to advanced diet further.   Acute hypoxic respiratory failure -In the setting of bilateral pleural effusion, atelectasis and post op -Resolved. Currently on RA.   Transient episode of asymptomatic V. tach: -Electrolytes replaced.   Moderate persistent asthma: - Continue DuoNebs, Brovana and Pulmicort  Essential hypertension: - Continue amlodipine  and ARB  Generalized anxiety disorder: -Resume Ativan . - Continue Celexa , trazodone  as needed  Tobacco use disorder: - Counseling provided  Thrombocytopenia: - Resolved  Atypical chest pain: - Continue with PPI - Troponins negative   Hypernatremia: - Resolved - Likely hypovolemia.   Hypercalcemia: - Treated with fluids  Hypophosphatemia: - Replaced  Hypokalemia: - Replaced  Hypomagnesemia -Replaced.    Nutrition Problem: Inadequate oral  intake Etiology: acute illness, altered GI function (small bowel obstruction)    Signs/Symptoms: energy intake < 75% for > 7 days    Interventions: Refer to RD note for recommendations, Boost Breeze, TPN  Estimated body mass index is 30.87 kg/m as calculated from the following:   Height as of this encounter: 5' (1.524 m).   Weight as of this encounter: 71.7 kg.   DVT prophylaxis: SCDs, Lovenox Code Status: Full code Family Communication: Care discussed with patient Disposition Plan:  Status is: Inpatient Remains inpatient appropriate because: Management of postop ileus    Consultants:  General surgery  Procedures:  None  Antimicrobials:    Subjective: She is feeling better. Report some chest pain after picc line placed yesterday.  Had BM yesterday   Objective: Vitals:   12/25/23 2045 12/26/23 0500 12/26/23 0650 12/26/23 0745  BP: 137/86  136/85   Pulse: 82  67   Resp: 16  16   Temp: 98.8 F (37.1 C)  98.3 F (36.8 C)   TempSrc:      SpO2: 99%  100% 97%  Weight:  71.7 kg    Height:        Intake/Output Summary (Last 24 hours) at 12/26/2023 0753 Last data filed at 12/26/2023 9391 Gross per 24 hour  Intake 1800.75 ml  Output 0 ml  Net 1800.75 ml   Filed Weights   12/23/23 0448 12/25/23 0721 12/26/23 0500  Weight: 72.6 kg 72.5 kg 71.7 kg    Examination:  General exam: Appears calm and comfortable  Respiratory system: Clear to auscultation. Respiratory effort normal. Cardiovascular system: S1 & S2 heard, RRR. No JVD, murmurs, rubs, gallops or clicks. No pedal edema. Gastrointestinal system: Abdomen is nondistended, soft and mild tender. Incision with staples.  Central nervous system:  Alert and oriented. No focal neurological deficits. Extremities: Symmetric 5 x 5 power.    Data Reviewed: I have personally reviewed following labs and imaging studies  CBC: Recent Labs  Lab 12/20/23 0448 12/22/23 0532 12/24/23 0455 12/26/23 0059  WBC 8.3  7.8 10.2 7.3  HGB 11.5* 12.1 14.1 11.3*  HCT 37.8 37.4 42.1 34.7*  MCV 91.7 88.8 86.4 88.7  PLT 117* 153 203 158   Basic Metabolic Panel: Recent Labs  Lab 12/22/23 0532 12/22/23 0918 12/23/23 0903 12/24/23 0455 12/25/23 0451 12/26/23 0059  NA 141  --  139 141 138 139  K 2.7*  --  3.3* 4.6 3.7 3.3*  CL 104  --  104 105 104 107  CO2 25  --  22 25 24 24   GLUCOSE 77  --  113* 129* 83 105*  BUN 12  --  12 16 13  6*  CREATININE 0.42*  --  0.53 0.64 0.59 0.52  CALCIUM  10.0  --  10.7* 10.7* 9.6 9.2  MG  --  1.8  --  1.6* 1.7 2.1  PHOS  --  2.8  --   --  3.3 2.8   GFR: Estimated Creatinine Clearance: 63.6 mL/min (by C-G formula based on SCr of 0.52 mg/dL). Liver Function Tests: Recent Labs  Lab 12/25/23 0451 12/26/23 0059  AST 45* 44*  ALT 35 46*  ALKPHOS 76 78  BILITOT 0.8 0.5  PROT 5.9* 5.7*  ALBUMIN 3.3* 3.4*   No results for input(s): LIPASE, AMYLASE in the last 168 hours. No results for input(s): AMMONIA in the last 168 hours. Coagulation Profile: No results for input(s): INR, PROTIME in the last 168 hours. Cardiac Enzymes: No results for input(s): CKTOTAL, CKMB, CKMBINDEX, TROPONINI in the last 168 hours. BNP (last 3 results) No results for input(s): PROBNP in the last 8760 hours. HbA1C: No results for input(s): HGBA1C in the last 72 hours. CBG: Recent Labs  Lab 12/25/23 1655 12/25/23 2351 12/26/23 0745  GLUCAP 86 139* 119*   Lipid Profile: No results for input(s): CHOL, HDL, LDLCALC, TRIG, CHOLHDL, LDLDIRECT in the last 72 hours. Thyroid  Function Tests: No results for input(s): TSH, T4TOTAL, FREET4, T3FREE, THYROIDAB in the last 72 hours. Anemia Panel: No results for input(s): VITAMINB12, FOLATE, FERRITIN, TIBC, IRON, RETICCTPCT in the last 72 hours. Sepsis Labs: No results for input(s): PROCALCITON, LATICACIDVEN in the last 168 hours.  No results found for this or any previous visit (from the  past 240 hours).       Radiology Studies: US  EKG SITE RITE Result Date: 12/25/2023 If Site Rite image not attached, placement could not be confirmed due to current cardiac rhythm.  DG Abd 1 View Result Date: 12/25/2023 EXAM: 1 VIEW XRAY OF THE ABDOMEN 12/25/2023 08:30:00 AM COMPARISON: Comparison 12/24/2023. CLINICAL HISTORY: FINDINGS: LINES, TUBES AND DEVICES: Midline surgical staples are again noted. BOWEL: Increased gastric distention is noted. Increased small bowel dilatation is noted most consistent with postoperative ileus. No significant colonic dilatation is noted. SOFT TISSUES: No opaque urinary calculi. BONES: No acute osseous abnormality. IMPRESSION: 1. Postoperative ileus with increased gastric distention and small bowel dilatation. 2. No significant colonic dilatation. Electronically signed by: Lynwood Seip MD 12/25/2023 09:13 AM EDT RP Workstation: [id number]   DG Abd Portable 1V Result Date: 12/24/2023 EXAM: 1 VIEW XRAY OF THE ABDOMEN 12/24/2023 10:59:00 AM COMPARISON: 12/18/2023 CLINICAL HISTORY: FINDINGS: BOWEL: Mildly dilated mid and left abdominal small bowel loops and a few in the proximal to mid ascending colon consistent with  postoperative ileus. SOFT TISSUES: Midline laparotomy staples noted. No opaque urinary calculi. BONES: No acute osseous abnormality. IMPRESSION: 1. Findings consistent with postoperative ileus. Electronically signed by: Norleen Boxer MD 12/24/2023 11:29 AM EDT RP Workstation: [id number]        Scheduled Meds:  acetaminophen   1,000 mg Oral Q6H   amLODipine   5 mg Oral Daily   arformoterol  15 mcg Nebulization BID   budesonide (PULMICORT) nebulizer solution  0.25 mg Nebulization BID   Chlorhexidine  Gluconate Cloth  6 each Topical Daily   enoxaparin (LOVENOX) injection  40 mg Subcutaneous QHS   feeding supplement  1 Container Oral TID BM   influenza vac split trivalent PF  0.5 mL Intramuscular Tomorrow-1000   insulin aspart  0-9 Units  Subcutaneous Q8H   losartan   50 mg Oral Daily   pantoprazole   40 mg Oral Q1200   pneumococcal 20-valent conjugate vaccine  0.5 mL Intramuscular Tomorrow-1000   polyethylene glycol  17 g Oral BID   sodium chloride  flush  10-40 mL Intracatheter Q12H   sodium chloride  flush  10-40 mL Intracatheter Q12H   thiamine  100 mg Oral Daily   Continuous Infusions:  TPN ADULT (ION) 40 mL/hr at 12/25/23 1702     LOS: 9 days    Time spent: 35 Minutes    Burl Tauzin A Shaquanda Graves, MD [facility]   If 7PM-7AM, please contact night-coverage [email or web address]  12/26/2023, 7:53 AM

## 2023-12-26 NOTE — Progress Notes (Signed)
 Progress Note  9 Days Post-Op  Subjective: Pt having bowel function, no further nausea or vomiting. Reports feeling well overall. Still has a little bloating.   Objective: Vital signs in last 24 hours: Temp:  [98.3 F (36.8 C)-98.8 F (37.1 C)] 98.5 F (36.9 C) (10/29 0755) Pulse Rate:  [67-82] 72 (10/29 0755) Resp:  [16-17] 17 (10/29 0755) BP: (136-145)/(85-92) 143/92 (10/29 0755) SpO2:  [97 %-100 %] 99 % (10/29 0755) Weight:  [71.7 kg] 71.7 kg (10/29 0500) Last BM Date : 12/26/23  Intake/Output from previous day: 10/28 0701 - 10/29 0700 In: 1800.8 [P.O.:1010; I.V.:517.5; IV Piggyback:273.3] Out: 0  Intake/Output this shift: No intake/output data recorded.  PE: General: pleasant, WD, WN female who is laying in bed in NAD Heart: regular, rate, and rhythm.  Lungs:Respiratory effort nonlabored Abd: soft, NT, mild to moderate distention, incision C/D/I with staples present  Psych: A&Ox3 with an appropriate affect.    Lab Results:  Recent Labs    12/24/23 0455 12/26/23 0059  WBC 10.2 7.3  HGB 14.1 11.3*  HCT 42.1 34.7*  PLT 203 158   BMET Recent Labs    12/25/23 0451 12/26/23 0059  NA 138 139  K 3.7 3.3*  CL 104 107  CO2 24 24  GLUCOSE 83 105*  BUN 13 6*  CREATININE 0.59 0.52  CALCIUM  9.6 9.2   PT/INR No results for input(s): LABPROT, INR in the last 72 hours. CMP     Component Value Date/Time   NA 139 12/26/2023 0059   K 3.3 (L) 12/26/2023 0059   CL 107 12/26/2023 0059   CO2 24 12/26/2023 0059   GLUCOSE 105 (H) 12/26/2023 0059   BUN 6 (L) 12/26/2023 0059   CREATININE 0.52 12/26/2023 0059   CREATININE 0.58 09/23/2019 1128   CALCIUM  9.2 12/26/2023 0059   PROT 5.7 (L) 12/26/2023 0059   ALBUMIN 3.4 (L) 12/26/2023 0059   AST 44 (H) 12/26/2023 0059   ALT 46 (H) 12/26/2023 0059   ALKPHOS 78 12/26/2023 0059   BILITOT 0.5 12/26/2023 0059   GFRNONAA >60 12/26/2023 0059   GFRAA >60 09/30/2019 1202   Lipase     Component Value Date/Time    LIPASE 21 12/11/2023 1652       Studies/Results: US  EKG SITE RITE Result Date: 12/25/2023 If Site Rite image not attached, placement could not be confirmed due to current cardiac rhythm.  DG Abd 1 View Result Date: 12/25/2023 EXAM: 1 VIEW XRAY OF THE ABDOMEN 12/25/2023 08:30:00 AM COMPARISON: Comparison 12/24/2023. CLINICAL HISTORY: FINDINGS: LINES, TUBES AND DEVICES: Midline surgical staples are again noted. BOWEL: Increased gastric distention is noted. Increased small bowel dilatation is noted most consistent with postoperative ileus. No significant colonic dilatation is noted. SOFT TISSUES: No opaque urinary calculi. BONES: No acute osseous abnormality. IMPRESSION: 1. Postoperative ileus with increased gastric distention and small bowel dilatation. 2. No significant colonic dilatation. Electronically signed by: Lynwood Seip MD 12/25/2023 09:13 AM EDT RP Workstation: [id number]   DG Abd Portable 1V Result Date: 12/24/2023 EXAM: 1 VIEW XRAY OF THE ABDOMEN 12/24/2023 10:59:00 AM COMPARISON: 12/18/2023 CLINICAL HISTORY: FINDINGS: BOWEL: Mildly dilated mid and left abdominal small bowel loops and a few in the proximal to mid ascending colon consistent with postoperative ileus. SOFT TISSUES: Midline laparotomy staples noted. No opaque urinary calculi. BONES: No acute osseous abnormality. IMPRESSION: 1. Findings consistent with postoperative ileus. Electronically signed by: Norleen Boxer MD 12/24/2023 11:29 AM EDT RP Workstation: [id number]    Anti-infectives: Anti-infectives (From admission,  onward)    Start     Dose/Rate Route Frequency Ordered Stop   12/17/23 0115  ceFAZolin  (ANCEF ) IVPB 2g/100 mL premix        2 g 200 mL/hr over 30 Minutes Intravenous  Once 12/17/23 0107 12/17/23 0235        Assessment/Plan Closed loop SBO POD9 s/p ex-lap with LOA by Dr. Byerly  - KUB still shows some dilated loops of bowel, not formally read but looks slightly improved on my evaluation  - having  bowel function and no further N/V in last 24 hrs - labs reassuring, afebrile and HD stable  - ambulating  - I think can advance to FLD today, would continue TPN today  - if patient unable to tolerate advancement to FLD then would recommend CT AP to evaluate further   FEN: FLD, IVF per TRH, TPN VTE: LMWH ID: Ancef  pre-op  - per TRH -  HTN HLD Hx of CVA/TIA ?CKD Depression/anxiety GERD    LOS: 9 days     Burnard JONELLE Louder, [facility] Surgery 12/26/2023, 9:13 AM Please see Amion for pager number during day hours 7:00am-4:30pm

## 2023-12-26 NOTE — Progress Notes (Signed)
 PHARMACY - TOTAL PARENTERAL NUTRITION CONSULT NOTE   Indication: Prolonged ileus  Patient Measurements: Height: 5' (152.4 cm) Weight: 71.7 kg (158 lb 1.1 oz) IBW/kg (Calculated) : 45.5 TPN AdjBW (KG): 52.3 Body mass index is 30.87 kg/m. Usual Weight:   Assessment: 1 yoF admitted 10/19 with abdominal & flank pain. Underwent exploratory laparotomy with LOA on 10/20 to relieve closed-loop SBO. Patient began having N/V on 10/27 concerning for post-op ileus. Pharmacy consulted for TPN management.  Glucose / Insulin: No hx DM - CBGs well-controlled (goal 100-150) after starting TPN at half-rate - 1 unit sensitive SSI used yesterday Electrolytes: - K decreased; now low despite repletion yesterday - Phos decreasing but remains WNL - Mg improved to goal this AM  - Other lytes WNL Renal: SCr, BUN stable WNL Hepatic: LFTs, Tbili WNL; Alb slightly low; TG pending I/O:  - 350 ml CLD charted - not capturing UOP/stool OP (volume) - no drains, emesis, or other OP - mIVF: none GI Imaging: 10/27 AXR: postoperative ileus 10/28 AXR: same, with increasing SB distention GI Surgeries / Procedures:  10/20 Exploratory laparotomy with LOA  Central access: PICC placed 10/28 TPN start date: 10/28   Nutritional Goals: Goal TPN rate is 70 mL/hr to provide 84 g of protein and 1,697 kcals per day  RD Assessment:  Estimated Needs Total Energy Estimated Needs: 1550-1750 kcals Total Protein Estimated Needs: 70-85 grams Total Fluid Estimated Needs: >/= 1.6L  Current Nutrition:  Clear liquids  Plan:  MD ordered KCl 40 mEq PO x 1 Will add KPhos 15 mmol IV x 1  Continue 3-in-1 TPN at 40 mL/hr at 1800 Allow lytes to stabilize before advancing to goal Electrolytes in TPN: standard concentrations; no changes from yesterday Na - 50 mEq/L K - 50 mEq/L Ca - 5 mEq/L Mg - 5 mEq/L Phos - 15 mmol/L Cl:Ac ratio - 1:1 Add standard MVI and trace elements to TPN Chromium remains on hold d/t ongoing  critical shortage  Continue sensitive SSI with q8 hr CBG checks  MIVF per MD (none currently) Monitor TPN labs on Mon/Thurs   [name] Walker, PharmD, BCPS [phone] 12/26/2023, 7:37 AM

## 2023-12-27 DIAGNOSIS — K56609 Unspecified intestinal obstruction, unspecified as to partial versus complete obstruction: Secondary | ICD-10-CM | POA: Diagnosis not present

## 2023-12-27 LAB — BASIC METABOLIC PANEL WITH GFR
Anion gap: 8 (ref 5–15)
BUN: 6 mg/dL — ABNORMAL LOW (ref 8–23)
CO2: 22 mmol/L (ref 22–32)
Calcium: 9.2 mg/dL (ref 8.9–10.3)
Chloride: 110 mmol/L (ref 98–111)
Creatinine, Ser: 0.54 mg/dL (ref 0.44–1.00)
GFR, Estimated: >60 mL/min (ref >60)
Glucose, Bld: 112 mg/dL — ABNORMAL HIGH (ref 70–99)
Potassium: 4.2 mmol/L (ref 3.5–5.1)
Sodium: 140 mmol/L (ref 135–145)

## 2023-12-27 LAB — MAGNESIUM: Magnesium: 2 mg/dL (ref 1.7–2.4)

## 2023-12-27 LAB — PHOSPHORUS: Phosphorus: 2.9 mg/dL (ref 2.5–4.6)

## 2023-12-27 LAB — GLUCOSE, CAPILLARY: Glucose-Capillary: 123 mg/dL — ABNORMAL HIGH (ref 70–99)

## 2023-12-27 MED ORDER — BOOST PLUS PO LIQD
237.0000 mL | Freq: Three times a day (TID) | ORAL | Status: DC
  Administered 2023-12-27 – 2023-12-28 (×3): 237 mL via ORAL
  Filled 2023-12-27 (×4): qty 237

## 2023-12-27 MED ORDER — HYDROMORPHONE HCL 1 MG/ML IJ SOLN
0.5000 mg | INTRAMUSCULAR | Status: DC | PRN
  Administered 2023-12-27 – 2023-12-28 (×2): 0.5 mg via INTRAVENOUS
  Filled 2023-12-27 (×3): qty 0.5

## 2023-12-27 MED ORDER — ALUM & MAG HYDROXIDE-SIMETH 200-200-20 MG/5ML PO SUSP
30.0000 mL | Freq: Four times a day (QID) | ORAL | Status: DC | PRN
  Administered 2023-12-27: 30 mL via ORAL
  Filled 2023-12-27: qty 30

## 2023-12-27 MED ORDER — OXYCODONE HCL 5 MG PO TABS
5.0000 mg | ORAL_TABLET | ORAL | Status: DC | PRN
  Administered 2023-12-27 – 2023-12-28 (×3): 10 mg via ORAL
  Filled 2023-12-27 (×3): qty 2

## 2023-12-27 MED ORDER — CARMEX CLASSIC LIP BALM EX OINT
1.0000 | TOPICAL_OINTMENT | CUTANEOUS | Status: DC | PRN
  Administered 2023-12-27: 1 via TOPICAL
  Filled 2023-12-27: qty 10

## 2023-12-27 NOTE — Plan of Care (Signed)
   Problem: Education: Goal: Knowledge of General Education information will improve Description: Including pain rating scale, medication(s)/side effects and non-pharmacologic comfort measures Outcome: Progressing   Problem: Activity: Goal: Risk for activity intolerance will decrease Outcome: Progressing

## 2023-12-27 NOTE — Progress Notes (Signed)
 Progress Note  10 Days Post-Op  Subjective: Pt having bowel function, no further nausea or vomiting. Reports feeling well overall.   Objective: Vital signs in last 24 hours: Temp:  [98.4 F (36.9 C)-98.7 F (37.1 C)] 98.4 F (36.9 C) (10/30 0531) Pulse Rate:  [64-77] 64 (10/30 0531) Resp:  [17] 17 (10/30 0531) BP: (110-134)/(80-85) 110/80 (10/30 0531) SpO2:  [100 %] 100 % (10/30 0531) Last BM Date : 12/26/23  Intake/Output from previous day: 10/29 0701 - 10/30 0700 In: 1592.9 [P.O.:840; I.V.:752.9] Out: -  Intake/Output this shift: Total I/O In: 360 [P.O.:360] Out: 0   PE: General: pleasant, WD, WN female who is laying in bed in NAD Heart: regular, rate, and rhythm.  Lungs:Respiratory effort nonlabored Abd: soft, NT, mild distention, incision C/D/I with staples present  Psych: A&Ox3 with an appropriate affect.    Lab Results:  Recent Labs    12/26/23 0059  WBC 7.3  HGB 11.3*  HCT 34.7*  PLT 158   BMET Recent Labs    12/26/23 0059 12/27/23 0108  NA 139 140  K 3.3* 4.2  CL 107 110  CO2 24 22  GLUCOSE 105* 112*  BUN 6* 6*  CREATININE 0.52 0.54  CALCIUM  9.2 9.2   PT/INR No results for input(s): LABPROT, INR in the last 72 hours. CMP     Component Value Date/Time   NA 140 12/27/2023 0108   K 4.2 12/27/2023 0108   CL 110 12/27/2023 0108   CO2 22 12/27/2023 0108   GLUCOSE 112 (H) 12/27/2023 0108   BUN 6 (L) 12/27/2023 0108   CREATININE 0.54 12/27/2023 0108   CREATININE 0.58 09/23/2019 1128   CALCIUM  9.2 12/27/2023 0108   PROT 5.7 (L) 12/26/2023 0059   ALBUMIN 3.4 (L) 12/26/2023 0059   AST 44 (H) 12/26/2023 0059   ALT 46 (H) 12/26/2023 0059   ALKPHOS 78 12/26/2023 0059   BILITOT 0.5 12/26/2023 0059   GFRNONAA >60 12/27/2023 0108   GFRAA >60 09/30/2019 1202   Lipase     Component Value Date/Time   LIPASE 21 12/11/2023 1652       Studies/Results: DG CHEST PORT 1 VIEW Result Date: 12/26/2023 CLINICAL DATA:  Status post PICC  placement. EXAM: PORTABLE CHEST 1 VIEW COMPARISON:  Radiograph 12/18/2023 FINDINGS: Tip of the right upper extremity PICC projects over the lower SVC. No pneumothorax. Improved aeration from prior exam. The heart is normal in size. Stable mediastinal contours. No focal airspace disease, pulmonary edema or large pleural effusion. Thoracic scoliosis IMPRESSION: Tip of the right upper extremity PICC projects over the lower SVC. No pneumothorax. Electronically Signed   By: Andrea Gasman M.D.   On: 12/26/2023 15:00   DG Abd Portable 1V Result Date: 12/26/2023 EXAM: 1 VIEW XRAY OF THE ABDOMEN 12/26/2023 09:15:00 AM COMPARISON: 12/25/2023 CLINICAL HISTORY: FINDINGS: BOWEL: Continued mild gaseous distention of left abdominal small bowel loops slightly improved since prior study. Gas within nondistended large bowel. SOFT TISSUES: No opaque urinary calculi. BONES: No acute osseous abnormality. IMPRESSION: 1. Persistent mild gaseous distention of left abdominal small bowel loops, slightly improved since the prior study. Electronically signed by: Franky Crease MD 12/26/2023 01:29 PM EDT RP Workstation: [id number]    Anti-infectives: Anti-infectives (From admission, onward)    Start     Dose/Rate Route Frequency Ordered Stop   12/17/23 0115  ceFAZolin  (ANCEF ) IVPB 2g/100 mL premix        2 g 200 mL/hr over 30 Minutes Intravenous  Once 12/17/23 0107  12/17/23 0235        Assessment/Plan Closed loop SBO POD10 s/p ex-lap with LOA by Dr. Aron  - having bowel function and no further N/V in last 24 hrs - labs reassuring 10/29, afebrile and HD stable  - ambulating  - advance to soft diet and DC TPN, discussed with pharmacy - add some PO oxy for pain control, use IV for breakthrough only  - continue to progress, if tolerating soft diet and having bowel function may be ready to DC home from surgical standpoint in the next 24-48 hrs - can remove staples prior to DC  FEN: soft diet, DC TPN VTE: LMWH ID:  Ancef  pre-op  - per TRH -  HTN HLD Hx of CVA/TIA ?CKD Depression/anxiety GERD    LOS: 10 days     Burnard JONELLE Louder, [facility] 12/27/2023, 11:13 AM Please see Amion for pager number during day hours 7:00am-4:30pm

## 2023-12-27 NOTE — Discharge Instructions (Signed)
[facility]      [facility] [phone]  OPEN ABDOMINAL SURGERY: POST OP INSTRUCTIONS  Always review your discharge instruction sheet given to you by the facility where your surgery was performed.  IF YOU HAVE DISABILITY OR FAMILY LEAVE FORMS, YOU MUST BRING THEM TO THE OFFICE FOR PROCESSING.  PLEASE DO NOT GIVE THEM TO YOUR DOCTOR.  A prescription for pain medication may be given to you upon discharge.  Take your pain medication as prescribed, if needed.  If narcotic pain medicine is not needed, then you may take acetaminophen (Tylenol) or ibuprofen (Advil) as needed. Take your usually prescribed medications unless otherwise directed. If you need a refill on your pain medication, please contact your pharmacy. They will contact our office to request authorization.  Prescriptions will not be filled after 5pm or on week-ends. You should follow a light diet the first few days after arrival home, such as soup and crackers, pudding, etc.unless your doctor has advised otherwise. A high-fiber, low fat diet can be resumed as tolerated.   Be sure to include lots of fluids daily. Most patients will experience some swelling and bruising on the chest and neck area.  Ice packs will help.  Swelling and bruising can take several days to resolve Most patients will experience some swelling and bruising in the area of the incision. Ice pack will help. Swelling and bruising can take several days to resolve..  It is common to experience some constipation if taking pain medication after surgery.  Increasing fluid intake and taking a stool softener will usually help or prevent this problem from occurring.  A mild laxative (Milk of Magnesia or Miralax) should be taken according to package directions if there are no bowel movements after 48 hours.  You may have steri-strips (small skin tapes) in place directly over the incision.  These strips should be left on the skin for 7-10 days.  If your surgeon used skin  glue on the incision, you may shower in 24 hours.  The glue will flake off over the next 2-3 weeks.  Any sutures or staples will be removed at the office during your follow-up visit. You may find that a light gauze bandage over your incision may keep your staples from being rubbed or pulled. You may shower and replace the bandage daily. ACTIVITIES:  You may resume regular (light) daily activities beginning the next day--such as daily self-care, walking, climbing stairs--gradually increasing activities as tolerated.  You may have sexual intercourse when it is comfortable.  Refrain from any heavy lifting or straining until approved by your doctor. You may drive when you no longer are taking prescription pain medication, you can comfortably wear a seatbelt, and you can safely maneuver your car and apply brakes Return to Work: ___________________________________ You should see your doctor in the office for a follow-up appointment approximately two weeks after your surgery.  Make sure that you call for this appointment within a day or two after you arrive home to insure a convenient appointment time.   WHEN TO CALL YOUR DOCTOR: Fever over 101.0 Inability to urinate Nausea and/or vomiting Extreme swelling or bruising Continued bleeding from incision. Increased pain, redness, or drainage from the incision. Difficulty swallowing or breathing Muscle cramping or spasms. Numbness or tingling in hands or feet or around lips.  The clinic staff is available to answer your questions during regular business hours.  Please don't hesitate to call and ask to speak to one of the nurses if you   have concerns.  For further questions, please visit [email or web address]  

## 2023-12-27 NOTE — Progress Notes (Signed)
 Mobility Specialist Progress Note:   12/27/23 0930  Mobility  Activity Ambulated independently  Level of Assistance Independent  Assistive Device None  Distance Ambulated (ft) 400 ft  Activity Response Tolerated well  Mobility Referral Yes  Mobility visit 1 Mobility  Mobility Specialist Start Time (ACUTE ONLY) 0909  Mobility Specialist Stop Time (ACUTE ONLY) 0920  Mobility Specialist Time Calculation (min) (ACUTE ONLY) 11 min   Pt was received in recliner and agreed to mobility. No complaints during ambulation. Returned with all needs met. Call bell in reach.  [employer] - Mobility Specialist

## 2023-12-27 NOTE — Progress Notes (Signed)
 PROGRESS NOTE    [name] Walker  FMW:[id number] DOB: [birth year] DOA: 12/16/2023 PCP: Norleen Lynwood ORN, MD   Brief Narrative: 63 year old with past medical history significant for persistent asthma, GAD, admitted 10/19 with a small bowel obstruction, initially presented complaining with abdominal pain.  C showed: Omentum pelvis finding concerning for closed-loop small bowel obstruction in the mid hemiabdomen with 2 adjacent transition point and mesenteric swirling.  Patient underwent exploratory laparotomy with LOA 10/20 closed-loop bowel obstruction.   Assessment & Plan:   Principal Problem:   Small bowel obstruction (HCC) Active Problems:   Tobacco abuse   Generalized abdominal pain   Nausea   Hypercalcemia   History of essential hypertension   Moderate persistent asthma   GAD (generalized anxiety disorder)   1-SBO, closed-loop, POA: - Status post exploratory laparotomy and lysis of adhesion in 12/17/2023 - General Surgery following and managing - Started on TPN Doing better, had multiples BM, plan to advanced diet to soft today.   Acute hypoxic respiratory failure -In the setting of bilateral pleural effusion, atelectasis and post op -Resolved. Currently on RA.   Transient episode of asymptomatic V. tach: -Electrolytes replaced.   Moderate persistent asthma: - Continue DuoNebs, Brovana and Pulmicort  Essential hypertension: - Continue amlodipine  and ARB  Generalized anxiety disorder: -Resume Ativan . - Continue Celexa , trazodone  as needed  Tobacco use disorder: - Counseling provided  Thrombocytopenia: - Resolved  Atypical chest pain: - Continue with PPI - Troponins negative   Hypernatremia: - Resolved - Likely hypovolemia.   Hypercalcemia: - Treated with fluids  Hypophosphatemia: - Replaced  Hypokalemia: - Replaced  Hypomagnesemia -Replaced.    Nutrition Problem: Inadequate oral intake Etiology: acute illness, altered GI function (small  bowel obstruction)    Signs/Symptoms: energy intake < 75% for > 7 days    Interventions: Refer to RD note for recommendations, Boost Breeze, TPN  Estimated body mass index is 30.87 kg/m as calculated from the following:   Height as of this encounter: 5' (1.524 m).   Weight as of this encounter: 71.7 kg.   DVT prophylaxis: SCDs, Lovenox Code Status: Full code Family Communication: Care discussed with patient Disposition Plan:  Status is: Inpatient Remains inpatient appropriate because: Management of postop ileus    Consultants:  General surgery  Procedures:  None  Antimicrobials:    Subjective: Had multiples BM, feels better. Happy her diet will be advanced   Objective: Vitals:   12/26/23 1404 12/26/23 2029 12/26/23 2058 12/27/23 0531  BP: 134/85  128/80 110/80  Pulse: 77  68 64  Resp: 17  17 17   Temp: 98.4 F (36.9 C)  98.7 F (37.1 C) 98.4 F (36.9 C)  TempSrc:    Oral  SpO2: 100% 100% 100% 100%  Weight:      Height:        Intake/Output Summary (Last 24 hours) at 12/27/2023 0740 Last data filed at 12/27/2023 9385 Gross per 24 hour  Intake 1592.91 ml  Output --  Net 1592.91 ml   Filed Weights   12/23/23 0448 12/25/23 0721 12/26/23 0500  Weight: 72.6 kg 72.5 kg 71.7 kg    Examination:  General exam: NAD Respiratory system: CTA Cardiovascular system: S 1, S 2 RRR Gastrointestinal system: BS present, soft, nt, incision with staple.  Central nervous system: alert, walking in her room  Extremities: no edema    Data Reviewed: I have personally reviewed following labs and imaging studies  CBC: Recent Labs  Lab 12/22/23 0532 12/24/23 0455 12/26/23 0059  WBC 7.8 10.2 7.3  HGB 12.1 14.1 11.3*  HCT 37.4 42.1 34.7*  MCV 88.8 86.4 88.7  PLT 153 203 158   Basic Metabolic Panel: Recent Labs  Lab 12/22/23 0918 12/23/23 0903 12/24/23 0455 12/25/23 0451 12/26/23 0059 12/27/23 0108  NA  --  139 141 138 139 140  K  --  3.3* 4.6 3.7 3.3*  4.2  CL  --  104 105 104 107 110  CO2  --  22 25 24 24 22   GLUCOSE  --  113* 129* 83 105* 112*  BUN  --  12 16 13  6* 6*  CREATININE  --  0.53 0.64 0.59 0.52 0.54  CALCIUM   --  10.7* 10.7* 9.6 9.2 9.2  MG 1.8  --  1.6* 1.7 2.1 2.0  PHOS 2.8  --   --  3.3 2.8 2.9   GFR: Estimated Creatinine Clearance: 63.6 mL/min (by C-G formula based on SCr of 0.54 mg/dL). Liver Function Tests: Recent Labs  Lab 12/25/23 0451 12/26/23 0059  AST 45* 44*  ALT 35 46*  ALKPHOS 76 78  BILITOT 0.8 0.5  PROT 5.9* 5.7*  ALBUMIN 3.3* 3.4*   No results for input(s): LIPASE, AMYLASE in the last 168 hours. No results for input(s): AMMONIA in the last 168 hours. Coagulation Profile: No results for input(s): INR, PROTIME in the last 168 hours. Cardiac Enzymes: No results for input(s): CKTOTAL, CKMB, CKMBINDEX, TROPONINI in the last 168 hours. BNP (last 3 results) No results for input(s): PROBNP in the last 8760 hours. HbA1C: No results for input(s): HGBA1C in the last 72 hours. CBG: Recent Labs  Lab 12/25/23 1655 12/25/23 2351 12/26/23 0745 12/26/23 1548 12/26/23 2317  GLUCAP 86 139* 119* 112* 127*   Lipid Profile: No results for input(s): CHOL, HDL, LDLCALC, TRIG, CHOLHDL, LDLDIRECT in the last 72 hours. Thyroid  Function Tests: No results for input(s): TSH, T4TOTAL, FREET4, T3FREE, THYROIDAB in the last 72 hours. Anemia Panel: No results for input(s): VITAMINB12, FOLATE, FERRITIN, TIBC, IRON, RETICCTPCT in the last 72 hours. Sepsis Labs: No results for input(s): PROCALCITON, LATICACIDVEN in the last 168 hours.  No results found for this or any previous visit (from the past 240 hours).       Radiology Studies: DG CHEST PORT 1 VIEW Result Date: 12/26/2023 CLINICAL DATA:  Status post PICC placement. EXAM: PORTABLE CHEST 1 VIEW COMPARISON:  Radiograph 12/18/2023 FINDINGS: Tip of the right upper extremity PICC projects over the  lower SVC. No pneumothorax. Improved aeration from prior exam. The heart is normal in size. Stable mediastinal contours. No focal airspace disease, pulmonary edema or large pleural effusion. Thoracic scoliosis IMPRESSION: Tip of the right upper extremity PICC projects over the lower SVC. No pneumothorax. Electronically Signed   By: Andrea Gasman M.D.   On: 12/26/2023 15:00   DG Abd Portable 1V Result Date: 12/26/2023 EXAM: 1 VIEW XRAY OF THE ABDOMEN 12/26/2023 09:15:00 AM COMPARISON: 12/25/2023 CLINICAL HISTORY: FINDINGS: BOWEL: Continued mild gaseous distention of left abdominal small bowel loops slightly improved since prior study. Gas within nondistended large bowel. SOFT TISSUES: No opaque urinary calculi. BONES: No acute osseous abnormality. IMPRESSION: 1. Persistent mild gaseous distention of left abdominal small bowel loops, slightly improved since the prior study. Electronically signed by: Franky Crease MD 12/26/2023 01:29 PM EDT RP Workstation: [id number]   US  EKG SITE RITE Result Date: 12/25/2023 If Site Rite image not attached, placement could not be confirmed due to current cardiac rhythm.  DG Abd 1 View Result  Date: 12/25/2023 EXAM: 1 VIEW XRAY OF THE ABDOMEN 12/25/2023 08:30:00 AM COMPARISON: Comparison 12/24/2023. CLINICAL HISTORY: FINDINGS: LINES, TUBES AND DEVICES: Midline surgical staples are again noted. BOWEL: Increased gastric distention is noted. Increased small bowel dilatation is noted most consistent with postoperative ileus. No significant colonic dilatation is noted. SOFT TISSUES: No opaque urinary calculi. BONES: No acute osseous abnormality. IMPRESSION: 1. Postoperative ileus with increased gastric distention and small bowel dilatation. 2. No significant colonic dilatation. Electronically signed by: Lynwood Seip MD 12/25/2023 09:13 AM EDT RP Workstation: [id number]        Scheduled Meds:  acetaminophen   1,000 mg Oral Q6H   amLODipine   5 mg Oral Daily   arformoterol   15 mcg Nebulization BID   budesonide (PULMICORT) nebulizer solution  0.25 mg Nebulization BID   Chlorhexidine  Gluconate Cloth  6 each Topical Daily   enoxaparin (LOVENOX) injection  40 mg Subcutaneous QHS   feeding supplement  1 Container Oral TID BM   influenza vac split trivalent PF  0.5 mL Intramuscular Tomorrow-1000   insulin aspart  0-9 Units Subcutaneous Q8H   losartan   50 mg Oral Daily   pantoprazole   40 mg Oral Q1200   pneumococcal 20-valent conjugate vaccine  0.5 mL Intramuscular Tomorrow-1000   polyethylene glycol  17 g Oral BID   sodium chloride  flush  10-40 mL Intracatheter Q12H   sodium chloride  flush  10-40 mL Intracatheter Q12H   thiamine  100 mg Oral Daily   Continuous Infusions:  TPN ADULT (ION) 40 mL/hr at 12/26/23 1708     LOS: 10 days    Time spent: 35 Minutes    Ola Fawver A Vercie Pokorny, MD [facility]   If 7PM-7AM, please contact night-coverage [email or web address]  12/27/2023, 7:40 AM

## 2023-12-27 NOTE — Progress Notes (Signed)
 Nutrition Follow-up  DOCUMENTATION CODES:   Obesity unspecified  INTERVENTION:   -TPN weaning off today  -Continue 100 mg Thiamine while still adjusting to PO  -Switch Boost Breeze to Boost Plus chocolate TID- Each supplement provides 360kcal and 14g protein.     NUTRITION DIAGNOSIS:   Inadequate oral intake related to acute illness, altered GI function (small bowel obstruction) as evidenced by energy intake < 75% for > 7 days.  Resolving.   GOAL:   Patient will meet greater than or equal to 90% of their needs  Progressing.  MONITOR:   PO intake, Supplement acceptance, Diet advancement, Labs, Weight trends  ASSESSMENT:   63 y.o. female with PMH significant for persistent asthma and GAD who presented complaining of abdominal pain and admitted for small bowel obstruction.  10/19 Admit; NPO; NGT placed 10/24 NGT removed; CLD 10/25 FLD 10/27 NPO 10/28 CLD; starting TPN 10/29 FLD  Patient is now on soft diet. Consuming liquids and tolerating. Boost Breeze was ordered but pt's family bringing in chocolate which we have on formulary, will switch orders.  TPN to wean off today. Will run at 40 until 1800.  Admission weight: 154 lbs Current weight: 158 lbs  Medications: Miralax, Thiamine  Labs reviewed: CBGs: 112-127  Diet Order:   Diet Order             DIET SOFT Room service appropriate? Yes; Fluid consistency: Thin  Diet effective now                   EDUCATION NEEDS:   Education needs have been addressed  Skin:  Skin Assessment: Skin Integrity Issues: Skin Integrity Issues:: Incisions Incisions: 10/20 abdomen  Last BM:  10/29  Height:   Ht Readings from Last 1 Encounters:  12/25/23 5' (1.524 m)    Weight:   Wt Readings from Last 1 Encounters:  12/26/23 71.7 kg    BMI:  Body mass index is 30.87 kg/m.  Estimated Nutritional Needs:   Kcal:  1550-1750 kcals  Protein:  70-85 grams  Fluid:  >/= 1.6L  Morna Lee, MS, RD,  LDN Inpatient Clinical Dietitian Contact via Secure chat

## 2023-12-27 NOTE — Progress Notes (Signed)
 PHARMACY - TOTAL PARENTERAL NUTRITION CONSULT NOTE   Indication: Prolonged ileus  Patient Measurements: Height: 5' (152.4 cm) Weight: 71.7 kg (158 lb 1.1 oz) IBW/kg (Calculated) : 45.5 TPN AdjBW (KG): 52.3 Body mass index is 30.87 kg/m. Usual Weight:   Assessment: 67 yoF admitted 10/19 with abdominal & flank pain. Underwent exploratory laparotomy with LOA on 10/20 to relieve closed-loop SBO. Patient began having N/V on 10/27 concerning for post-op ileus. Pharmacy consulted for TPN management.  Glucose / Insulin: No hx DM - CBGs well-controlled (goal 100-150) on TPN at half-rate - 2 unit sensitive SSI used yesterday Electrolytes: normalized after repletion Renal: SCr, BUN stable WNL Hepatic: LFTs, Tbili WNL; Alb slightly low; TG pending I/O:  - 950 ml FLD charted - not capturing UOP/stool OP (volume) - no drains, emesis, or other OP - mIVF: none GI Imaging: 10/27 AXR: postoperative ileus 10/28 AXR: same, with increasing SB distention GI Surgeries / Procedures:  10/20 Exploratory laparotomy with LOA  Central access: PICC placed 10/28 TPN start date: 10/28   Nutritional Goals: Goal TPN rate is 70 mL/hr to provide 84 g of protein and 1,697 kcals per day  RD Assessment:  Estimated Needs Total Energy Estimated Needs: 1550-1750 kcals Total Protein Estimated Needs: 70-85 grams Total Fluid Estimated Needs: >/= 1.6L  Current Nutrition:  Clear liquids  Plan:  Tolerating FLD well; no further TPN today per CCS Allow current TPN running at 40 mL/hr to complete (at 1800) No further electrolyte needs Will stop SSI d/t excellent control Pharmacy to sign off  Bard Jeans, PharmD, BCPS [phone] 12/27/2023, 8:10 AM

## 2023-12-28 DIAGNOSIS — K56609 Unspecified intestinal obstruction, unspecified as to partial versus complete obstruction: Secondary | ICD-10-CM | POA: Diagnosis not present

## 2023-12-28 LAB — BASIC METABOLIC PANEL WITH GFR
Anion gap: 11 (ref 5–15)
BUN: 11 mg/dL (ref 8–23)
CO2: 23 mmol/L (ref 22–32)
Calcium: 9.7 mg/dL (ref 8.9–10.3)
Chloride: 107 mmol/L (ref 98–111)
Creatinine, Ser: 0.56 mg/dL (ref 0.44–1.00)
GFR, Estimated: >60 mL/min (ref >60)
Glucose, Bld: 88 mg/dL (ref 70–99)
Potassium: 4.1 mmol/L (ref 3.5–5.1)
Sodium: 141 mmol/L (ref 135–145)

## 2023-12-28 MED ORDER — POLYETHYLENE GLYCOL 3350 17 G PO PACK: 17.0000 g | PACK | Freq: Every day | ORAL | Status: AC | PRN

## 2023-12-28 MED ORDER — NICOTINE 14 MG/24HR TD PT24: 14.0000 mg | MEDICATED_PATCH | Freq: Every day | TRANSDERMAL | 0 refills | Status: AC | PRN

## 2023-12-28 MED ORDER — ACETAMINOPHEN 500 MG PO TABS: 1000.0000 mg | ORAL_TABLET | Freq: Four times a day (QID) | ORAL | Status: AC | PRN

## 2023-12-28 MED ORDER — LORAZEPAM 1 MG PO TABS: 1.0000 mg | ORAL_TABLET | Freq: Two times a day (BID) | ORAL | 0 refills | Status: AC | PRN

## 2023-12-28 MED ORDER — OXYCODONE HCL 5 MG PO TABS: 5.0000 mg | ORAL_TABLET | ORAL | 0 refills | Status: AC | PRN

## 2023-12-28 NOTE — Plan of Care (Signed)
  Problem: Education: Goal: Knowledge of General Education information will improve Description: Including pain rating scale, medication(s)/side effects and non-pharmacologic comfort measures Outcome: Adequate for Discharge   Problem: Health Behavior/Discharge Planning: Goal: Ability to manage health-related needs will improve Outcome: Adequate for Discharge   Problem: Clinical Measurements: Goal: Ability to maintain clinical measurements within normal limits will improve Outcome: Adequate for Discharge Goal: Will remain free from infection Outcome: Adequate for Discharge Goal: Diagnostic test results will improve Outcome: Adequate for Discharge Goal: Respiratory complications will improve Outcome: Adequate for Discharge Goal: Cardiovascular complication will be avoided Outcome: Adequate for Discharge   Problem: Activity: Goal: Risk for activity intolerance will decrease Outcome: Adequate for Discharge   Problem: Nutrition: Goal: Adequate nutrition will be maintained Outcome: Adequate for Discharge   Problem: Coping: Goal: Level of anxiety will decrease Outcome: Adequate for Discharge   Problem: Elimination: Goal: Will not experience complications related to bowel motility Outcome: Adequate for Discharge Goal: Will not experience complications related to urinary retention Outcome: Adequate for Discharge   Problem: Pain Managment: Goal: General experience of comfort will improve and/or be controlled Outcome: Adequate for Discharge   Problem: Safety: Goal: Ability to remain free from injury will improve Outcome: Adequate for Discharge   Problem: Skin Integrity: Goal: Risk for impaired skin integrity will decrease Outcome: Adequate for Discharge   Problem: Inadequate Intake (NI-2.1) Goal: Food and/or nutrient delivery Description: Individualized approach for food/nutrient provision. Outcome: Adequate for Discharge

## 2023-12-28 NOTE — Progress Notes (Signed)
 Assessment unchanged. Pt and son verbalized understanding of dc instructions through teach back including medications, follow up care and when to call the doctor. Discharged via wc accompanied by NT and son.

## 2023-12-28 NOTE — Progress Notes (Signed)
 Progress Note  11 Days Post-Op  Subjective: Pt having bowel function, no further nausea or vomiting. Reports feeling well overall. Tolerating soft diet   Objective: Vital signs in last 24 hours: Temp:  [98.2 F (36.8 C)-98.8 F (37.1 C)] 98.8 F (37.1 C) (10/31 0432) Pulse Rate:  [62-94] 94 (10/31 0432) Resp:  [18] 18 (10/31 0432) BP: (93-130)/(71-96) 93/71 (10/31 0432) SpO2:  [94 %-100 %] 96 % (10/31 0750) Last BM Date : 12/27/23  Intake/Output from previous day: 10/30 0701 - 10/31 0700 In: 2339.1 [P.O.:1920; I.V.:419.1] Out: 0  Intake/Output this shift: No intake/output data recorded.  PE: General: pleasant, WD, WN female who is laying in bed in NAD Heart: regular, rate, and rhythm.  Lungs:Respiratory effort nonlabored Abd: soft, NT, mild distention, incision C/D/I with staples present  Psych: A&Ox3 with an appropriate affect.    Lab Results:  Recent Labs    12/26/23 0059  WBC 7.3  HGB 11.3*  HCT 34.7*  PLT 158   BMET Recent Labs    12/27/23 0108 12/28/23 0114  NA 140 141  K 4.2 4.1  CL 110 107  CO2 22 23  GLUCOSE 112* 88  BUN 6* 11  CREATININE 0.54 0.56  CALCIUM  9.2 9.7   PT/INR No results for input(s): LABPROT, INR in the last 72 hours. CMP     Component Value Date/Time   NA 141 12/28/2023 0114   K 4.1 12/28/2023 0114   CL 107 12/28/2023 0114   CO2 23 12/28/2023 0114   GLUCOSE 88 12/28/2023 0114   BUN 11 12/28/2023 0114   CREATININE 0.56 12/28/2023 0114   CREATININE 0.58 09/23/2019 1128   CALCIUM  9.7 12/28/2023 0114   PROT 5.7 (L) 12/26/2023 0059   ALBUMIN 3.4 (L) 12/26/2023 0059   AST 44 (H) 12/26/2023 0059   ALT 46 (H) 12/26/2023 0059   ALKPHOS 78 12/26/2023 0059   BILITOT 0.5 12/26/2023 0059   GFRNONAA >60 12/28/2023 0114   GFRAA >60 09/30/2019 1202   Lipase     Component Value Date/Time   LIPASE 21 12/11/2023 1652       Studies/Results: DG CHEST PORT 1 VIEW Result Date: 12/26/2023 CLINICAL DATA:  Status post  PICC placement. EXAM: PORTABLE CHEST 1 VIEW COMPARISON:  Radiograph 12/18/2023 FINDINGS: Tip of the right upper extremity PICC projects over the lower SVC. No pneumothorax. Improved aeration from prior exam. The heart is normal in size. Stable mediastinal contours. No focal airspace disease, pulmonary edema or large pleural effusion. Thoracic scoliosis IMPRESSION: Tip of the right upper extremity PICC projects over the lower SVC. No pneumothorax. Electronically Signed   By: Andrea Gasman M.D.   On: 12/26/2023 15:00   DG Abd Portable 1V Result Date: 12/26/2023 EXAM: 1 VIEW XRAY OF THE ABDOMEN 12/26/2023 09:15:00 AM COMPARISON: 12/25/2023 CLINICAL HISTORY: FINDINGS: BOWEL: Continued mild gaseous distention of left abdominal small bowel loops slightly improved since prior study. Gas within nondistended large bowel. SOFT TISSUES: No opaque urinary calculi. BONES: No acute osseous abnormality. IMPRESSION: 1. Persistent mild gaseous distention of left abdominal small bowel loops, slightly improved since the prior study. Electronically signed by: Franky Crease MD 12/26/2023 01:29 PM EDT RP Workstation: [id number]    Anti-infectives: Anti-infectives (From admission, onward)    Start     Dose/Rate Route Frequency Ordered Stop   12/17/23 0115  ceFAZolin  (ANCEF ) IVPB 2g/100 mL premix        2 g 200 mL/hr over 30 Minutes Intravenous  Once 12/17/23 0107 12/17/23 0235  Assessment/Plan Closed loop SBO POD11 s/p ex-lap with LOA by Dr. Aron  - having bowel function and no further N/V in last 24 hrs - labs reassuring 10/29, afebrile and HD stable  - ambulating  - tol soft diet  - add some PO oxy for pain control, use IV for breakthrough only  - stable for DC from surgery standpoint  - can remove staples prior to DC  FEN: soft diet, DC TPN VTE: LMWH ID: [id number]  pre-op  - per TRH -  HTN HLD Hx of CVA/TIA ?CKD Depression/anxiety GERD    LOS: 11 days     Burnard JONELLE Louder, [facility] 12/28/2023, 8:57 AM Please see Amion for pager number during day hours 7:00am-4:30pm

## 2023-12-28 NOTE — Progress Notes (Signed)
 Mobility Specialist Progress Note:   12/28/23 0903  Mobility  Activity Ambulated independently  Level of Assistance Independent  Assistive Device None  Distance Ambulated (ft) 400 ft  Activity Response Tolerated well  Mobility Referral Yes  Mobility visit 1 Mobility  Mobility Specialist Start Time (ACUTE ONLY) [id number]  Mobility Specialist Stop Time (ACUTE ONLY) 0845  Mobility Specialist Time Calculation (min) (ACUTE ONLY) 12 min   Pt was received in bed and agreed to mobility. No complaints during ambulation. Returned to recliner with all needs met. Call bell in reach.  [employer] - Mobility Specialist

## 2023-12-28 NOTE — Plan of Care (Signed)
   Problem: Education: Goal: Knowledge of General Education information will improve Description Including pain rating scale, medication(s)/side effects and non-pharmacologic comfort measures Outcome: Progressing   Problem: Health Behavior/Discharge Planning: Goal: Ability to manage health-related needs will improve Outcome: Progressing

## 2023-12-28 NOTE — Discharge Summary (Signed)
 Physician Discharge Summary   Patient: [name] Walker MRN: [id number] DOB: 1960-07-20  Admit date:     12/16/2023  Discharge date: 12/28/23  Discharge Physician: Owen DELENA Lore   PCP: Norleen Lynwood ORN, MD   Recommendations at discharge:   Needs to follow up with Surgery post op care.  Follow up with PCP for chronic medical problems.   Discharge Diagnoses: Principal Problem:   Small bowel obstruction (HCC) Active Problems:   Tobacco abuse   Generalized abdominal pain   Nausea   Hypercalcemia   History of essential hypertension   Moderate persistent asthma   GAD (generalized anxiety disorder)  Resolved Problems:   * No resolved hospital problems. [facility] Course: 63 year old with past medical history significant for persistent asthma, GAD, admitted 10/19 with a small bowel obstruction, initially presented complaining with abdominal pain.  C showed: Omentum pelvis finding concerning for closed-loop small bowel obstruction in the mid hemiabdomen with 2 adjacent transition point and mesenteric swirling.  Patient underwent exploratory laparotomy with LOA 10/20 closed-loop bowel obstruction.    Assessment and Plan: 1-SBO, closed-loop, POA: - Status post exploratory laparotomy and lysis of adhesion in 12/17/2023 - General Surgery following and managing - Started on TPN She has been having BM and tolerating diet. She has been clear for discharge by surgery.    Acute hypoxic respiratory failure -In the setting of bilateral pleural effusion, atelectasis and post op -Resolved. Currently on RA.    Transient episode of asymptomatic V. tach: -Electrolytes replaced.    Moderate persistent asthma: - Continue DuoNebs, Brovana and Pulmicort   Essential hypertension: - Continue amlodipine  and ARB   Generalized anxiety disorder: -Resume Ativan . - Continue Celexa , trazodone  as needed   Tobacco use disorder: - Counseling provided   Thrombocytopenia: - Resolved   Atypical  chest pain: - Continue with PPI - Troponins negative     Hypernatremia: - Resolved - Likely hypovolemia.    Hypercalcemia: - Treated with fluids   Hypophosphatemia: - Replaced   Hypokalemia: - Replaced   Hypomagnesemia -Replaced.      Nutrition Problem: Inadequate oral intake Etiology: acute illness, altered GI function (small bowel obstruction)          Consultants: Surgery  Procedures performed:  Disposition: Home Diet recommendation:  Cardiac diet DISCHARGE MEDICATION: Allergies as of 12/28/2023       Reactions   Flexeril  [cyclobenzaprine ] Swelling, Palpitations   Tongue swells   Other Palpitations   All muscle relaxers cause palpitations   Robaxin  [methocarbamol ] Swelling, Palpitations   Tongue swelling   Zestril  [lisinopril ] Swelling   Tongue swelling   Contrast Media [iodinated Contrast Media] Itching   Iodine Itching   Lyrica [pregabalin] Anxiety   Neurontin  [gabapentin ] Other (See Comments)   Unknown reaction        Medication List     PAUSE taking these medications    acetaminophen -codeine  300-60 MG tablet Wait to take this until your doctor or other care provider tells you to start again. Commonly known as: TYLENOL  #4 Take 1 tablet by mouth every 4 (four) hours as needed for moderate pain (pain score 4-6).       STOP taking these medications    ondansetron  4 MG tablet Commonly known as: ZOFRAN        TAKE these medications    acetaminophen  500 MG tablet Commonly known as: TYLENOL  Take 2 tablets (1,000 mg total) by mouth every 6 (six) hours as needed for mild pain (pain score 1-3), headache or fever.  What changed:  medication strength how much to take when to take this reasons to take this   albuterol  108 (90 Base) MCG/ACT inhaler Commonly known as: VENTOLIN  HFA INHALE 2 PUFFS BY MOUTH EVERY 6 HOURS AS NEEDED FOR WHEEZING FOR SHORTNESS OF BREATH   alendronate  70 MG tablet Commonly known as: FOSAMAX  Take 1 tablet  (70 mg total) by mouth every 7 (seven) days. Take with a full glass of water on an empty stomach. What changed:  when to take this additional instructions   amLODipine  5 MG tablet Commonly known as: NORVASC  Take 1 tablet (5 mg total) by mouth daily.   aspirin  EC 81 MG tablet Take 1 tablet (81 mg total) by mouth daily. Swallow whole.   atorvastatin  20 MG tablet Commonly known as: Lipitor Take 1 tablet (20 mg total) by mouth daily.   citalopram  40 MG tablet Commonly known as: CELEXA  Take 1 tablet (40 mg total) by mouth daily.   fluticasone  50 MCG/ACT nasal spray Commonly known as: FLONASE  Place 2 sprays into both nostrils daily. What changed:  how much to take when to take this reasons to take this   fluticasone -salmeterol 250-50 MCG/ACT Aepb Commonly known as: Advair Diskus Inhale 1 puff into the lungs daily. Can increase to twice daily during respiratory flares.   levocetirizine 5 MG tablet Commonly known as: XYZAL  Take 1 tablet (5 mg total) by mouth every evening.   lidocaine  4 % Place 1 patch onto the skin daily as needed (pain).   LORazepam  1 MG tablet Commonly known as: ATIVAN  Take 1 tablet (1 mg total) by mouth 2 (two) times daily as needed for up to 3 days. for anxiety What changed:  when to take this reasons to take this   losartan  50 MG tablet Commonly known as: COZAAR  Take 1 tablet by mouth once daily   nicotine  14 mg/24hr patch Commonly known as: NICODERM CQ  - dosed in mg/24 hours Place 1 patch (14 mg total) onto the skin daily as needed (smoking cessation).   oxyCODONE  5 MG immediate release tablet Commonly known as: Oxy IR/ROXICODONE  Take 1-2 tablets (5-10 mg total) by mouth every 4 (four) hours as needed for moderate pain (pain score 4-6) or severe pain (pain score 7-10).   pantoprazole  40 MG tablet Commonly known as: PROTONIX  TAKE 1 TABLET BY MOUTH TWICE DAILY BEFORE A MEAL   polyethylene glycol 17 g packet Commonly known as: MIRALAX /  GLYCOLAX Take 17 g by mouth daily as needed for mild constipation.   Thera-D 2000 50 MCG (2000 UT) Tabs Generic drug: Cholecalciferol 1 tab by mouth once daily   traZODone  100 MG tablet Commonly known as: DESYREL  TAKE 1 TABLET BY MOUTH AT BEDTIME        Follow-up Information     Aron Shoulders, MD. Go on 01/28/2024.   Specialty: General Surgery Why: 3 PM, please arrive 30 min prior to appointment time to check in. Contact information: [address] [place] [address] [phone]                Discharge Exam: Fredricka Weights   12/23/23 0448 12/25/23 0721 12/26/23 0500  Weight: 72.6 kg 72.5 kg 71.7 kg   General; NAD  Condition at discharge: stable  The results of significant diagnostics from this hospitalization (including imaging, microbiology, ancillary and laboratory) are listed below for reference.   Imaging Studies: DG CHEST PORT 1 VIEW Result Date: 12/26/2023 CLINICAL DATA:  Status post PICC placement. EXAM:  PORTABLE CHEST 1 VIEW COMPARISON:  Radiograph 12/18/2023 FINDINGS: Tip of the right upper extremity PICC projects over the lower SVC. No pneumothorax. Improved aeration from prior exam. The heart is normal in size. Stable mediastinal contours. No focal airspace disease, pulmonary edema or large pleural effusion. Thoracic scoliosis IMPRESSION: Tip of the right upper extremity PICC projects over the lower SVC. No pneumothorax. Electronically Signed   By: Andrea Gasman M.D.   On: 12/26/2023 15:00   DG Abd Portable 1V Result Date: 12/26/2023 EXAM: 1 VIEW XRAY OF THE ABDOMEN 12/26/2023 09:15:00 AM COMPARISON: 12/25/2023 CLINICAL HISTORY: FINDINGS: BOWEL: Continued mild gaseous distention of left abdominal small bowel loops slightly improved since prior study. Gas within nondistended large bowel. SOFT TISSUES: No opaque urinary calculi. BONES: No acute osseous abnormality. IMPRESSION: 1. Persistent mild gaseous distention of left abdominal small  bowel loops, slightly improved since the prior study. Electronically signed by: Franky Crease MD 12/26/2023 01:29 PM EDT RP Workstation: [id number]   US  EKG SITE RITE Result Date: 12/25/2023 If Site Rite image not attached, placement could not be confirmed due to current cardiac rhythm.  DG Abd 1 View Result Date: 12/25/2023 EXAM: 1 VIEW XRAY OF THE ABDOMEN 12/25/2023 08:30:00 AM COMPARISON: Comparison 12/24/2023. CLINICAL HISTORY: FINDINGS: LINES, TUBES AND DEVICES: Midline surgical staples are again noted. BOWEL: Increased gastric distention is noted. Increased small bowel dilatation is noted most consistent with postoperative ileus. No significant colonic dilatation is noted. SOFT TISSUES: No opaque urinary calculi. BONES: No acute osseous abnormality. IMPRESSION: 1. Postoperative ileus with increased gastric distention and small bowel dilatation. 2. No significant colonic dilatation. Electronically signed by: Lynwood Seip MD 12/25/2023 09:13 AM EDT RP Workstation: [id number]   DG Abd Portable 1V Result Date: 12/24/2023 EXAM: 1 VIEW XRAY OF THE ABDOMEN 12/24/2023 10:59:00 AM COMPARISON: 12/18/2023 CLINICAL HISTORY: FINDINGS: BOWEL: Mildly dilated mid and left abdominal small bowel loops and a few in the proximal to mid ascending colon consistent with postoperative ileus. SOFT TISSUES: Midline laparotomy staples noted. No opaque urinary calculi. BONES: No acute osseous abnormality. IMPRESSION: 1. Findings consistent with postoperative ileus. Electronically signed by: Norleen Boxer MD 12/24/2023 11:29 AM EDT RP Workstation: [id number]   CT CHEST WO CONTRAST Result Date: 12/19/2023 CLINICAL DATA:  Abnormal chest radiograph, lung nodule. EXAM: CT CHEST WITHOUT CONTRAST TECHNIQUE: Multidetector CT imaging of the chest was performed following the standard protocol without IV contrast. RADIATION DOSE REDUCTION: This exam was performed according to the departmental dose-optimization program which includes  automated exposure control, adjustment of the mA and/or kV according to patient size and/or use of iterative reconstruction technique. COMPARISON:  Chest radiograph 12/18/2023. FINDINGS: Cardiovascular: Right-sided aortic arch. Atherosclerotic calcification of the aorta. Heart is enlarged. No pericardial effusion. Mediastinum/Nodes: No pathologically enlarged mediastinal or axillary lymph nodes. Hilar regions are difficult to definitively evaluate without IV contrast. Nasogastric tube in the esophagus. Lungs/Pleura: Centrilobular emphysema. Patchy bilateral ground-glass with small bilateral pleural effusions. Volume loss in both lower lobes. Mild basilar septal thickening. Lingular scarring. Airway is unremarkable. Upper Abdomen: Elevated right hemidiaphragm. Small right renal stone. Nasogastric tube is partially imaged in the stomach. Visualized portions of the liver, gallbladder, adrenal glands, kidneys, spleen, pancreas, stomach and bowel are otherwise grossly unremarkable. No upper abdominal adenopathy. Musculoskeletal: Degenerative changes in the spine. IMPRESSION: 1. Patchy bilateral ground-glass with septal thickening and small bilateral pleural effusions, findings which may be due to congestive heart failure. An atypical or viral pneumonia cannot be excluded. 2. Bilateral lower lobe atelectasis. 3. Small right renal  stone. 4.  Aortic atherosclerosis (ICD10-I70.0). 5. Emphysema (ICD10-J43.9). Low-dose CT lung cancer screening is recommended for patients who are 13-40 years of age with a 20+ pack-year history of smoking and who are currently smoking or quit <=15 years ago. Electronically Signed   By: Newell Eke M.D.   On: 12/19/2023 12:47   DG CHEST PORT 1 VIEW Result Date: 12/18/2023 EXAM: 1 VIEW(S) XRAY OF THE CHEST 12/18/2023 03:12:00 PM COMPARISON: 02/07/2016 CLINICAL HISTORY: Chest pain FINDINGS: LINES, TUBES AND DEVICES: Enteric tube in stomach, likely terminating near gastric antrum. LUNGS AND  PLEURA: Low lung volumes. Bibasilar opacities. Small left pleural effusion. There are opacities over the medial aspect of the right upper lung field which could reflect partial collapse of the right upper lobe. Consider CT for further evaluation. No pulmonary edema. No pneumothorax. HEART AND MEDIASTINUM: Tortuosity of the descending thoracic aorta. No acute abnormality of the cardiac silhouette. BONES AND SOFT TISSUES: No acute osseous abnormality. IMPRESSION: 1. Opacities over the medial right upper lung field suspicious for partial right upper lobe collapse. CT chest recommended for further evaluation. 2. Small left pleural effusion. 3. Low lung volumes with bibasilar opacities. Electronically signed by: Donnice Mania MD 12/18/2023 05:59 PM EDT RP Workstation: [id number]   DG Abd 1 View Result Date: 12/18/2023 CLINICAL DATA:  Check gastric catheter placement EXAM: ABDOMEN - 1 VIEW COMPARISON:  None Available. FINDINGS: Gastric catheter is noted in the distal stomach. Postsurgical changes are seen. Some persistent small bowel dilatation is noted. IMPRESSION: Stable gastric catheter. Persistent small bowel dilatation. Electronically Signed   By: Oneil Devonshire M.D.   On: 12/18/2023 02:51   DG Abd 1 View Result Date: 12/17/2023 EXAM: 1 VIEW XRAY OF THE ABDOMEN 12/17/2023 at 4:30 am COMPARISON: CT abdomen and pelvis 12/16/2023 CLINICAL HISTORY: evaluate NG tube placement FINDINGS: LINES, TUBES AND DEVICES: There is an enteric tube with the tip in the side port below the GE junction. The tip projects over the expected location of the gastric antrum. Midline skin staples are identified. BOWEL: Multiple dilated loops of small bowel identified within the central abdomen and left upper quadrant of the abdomen. SOFT TISSUES: No opaque urinary calculi. BONES: No acute osseous abnormality. IMPRESSION: 1. Enteric tube tip projects over the expected location of the gastric antrum. 2. Multiple dilated loops of small bowel  within the left upper quadrant and central abdomen which may reflect postoperative ileus versus obstruction. Electronically signed by: Waddell Calk MD 12/17/2023 06:16 AM EDT RP Workstation: [id number]   CT Renal Stone Study Addendum Date: 12/16/2023 ADDENDUM #11. Critical value/emergent results were called by telephone at the time of interpretation on 12/16/23 at 11:05 pm to Dr. Griselda, who verbally acknowledged these results. Electronically signed by: Norman Gatlin MD 12/16/2023 11:20 PM EDT RP Workstation: [id number]   ORIGINAL REPORT EXAM: CT UROGRAM 12/16/2023 10:49:07 PM TECHNIQUE: CT of the abdomen and pelvis was performed without the administration of intravenous contrast as per CT urogram protocol. Multiplanar reformatted images as well as MIP urogram images are provided for review. Automated exposure control, iterative reconstruction, and/or weight based adjustment of the mA/kV was utilized to reduce the radiation dose to as low as reasonably achievable. COMPARISON: CT 12/11/2023 CLINICAL HISTORY: Abdominal/flank pain, stone suspected. FINDINGS: LOWER CHEST: No acute abnormality. LIVER: The liver is unremarkable. GALLBLADDER AND BILE DUCTS: Gallbladder is unremarkable. No biliary ductal dilatation. SPLEEN: No acute abnormality. PANCREAS: No acute abnormality. ADRENAL GLANDS: No acute abnormality. KIDNEYS, URETERS AND BLADDER: Nonobstructing right nephrolithiasis. No stones in  the ureters. No hydronephrosis. No perinephric or periureteral stranding. Urinary bladder is unremarkable. GI AND BOWEL: Diffuse dilation of the small bowel with 2 adjacent transition points in the mid left hemiabdomen. The initial transition point is on series 6 image 57. The small bowel downstream from the initial transition point is again dilated with a 2nd transition point in an area of mesenteric swirling (series 6 image 60-48). Findings are concerning for closed loop obstruction. The small bowel downstream from the 2nd  transition point is decompressed. Stomach demonstrates no acute abnormality. PERITONEUM AND RETROPERITONEUM: Small volume of pelvic ascites. No free air. VASCULATURE: Aorta is normal in caliber. LYMPH NODES: No lymphadenopathy. REPRODUCTIVE ORGANS: No acute abnormality. BONES AND SOFT TISSUES: No acute osseous abnormality. No focal soft tissue abnormality. IMPRESSION: 1. Findings concerning for closed-loop small-bowel obstruction in the mid left hemiabdomen with two adjacent transition points and mesenteric swirling; downstream bowel decompressed. No findings for acute ischemia. Urgent surgical evaluation recommended. 2. Nonobstructing right nephrolithiasis. Electronically signed by: Norman Gatlin MD 12/16/2023 11:12 PM EDT RP Workstation: [id number]   CT Renal Stone Study Result Date: 12/11/2023 EXAM: CT UROGRAM 12/11/2023 05:39:39 PM TECHNIQUE: CT of the abdomen and pelvis was performed without the administration of intravenous contrast as per CT urogram protocol. Multiplanar reformatted images as well as MIP urogram images are provided for review. Automated exposure control, iterative reconstruction, and/or weight based adjustment of the mA/kV was utilized to reduce the radiation dose to as low as reasonably achievable. COMPARISON: CT Urogram dated 09/11/20024. CLINICAL HISTORY: Abdominal/flank pain, stone suspected. Pt reports with left flank pain and hematuria since yesterday. Pt has a hx of kidney stones. FINDINGS: LOWER CHEST: No acute abnormality. LIVER: The liver is unremarkable. GALLBLADDER AND BILE DUCTS: Gallbladder is unremarkable. No biliary ductal dilatation. SPLEEN: No acute abnormality. PANCREAS: No acute abnormality. ADRENAL GLANDS: No acute abnormality. KIDNEYS, URETERS AND BLADDER: Small nonobstructive right renal calculus. Mild left hydroureteronephrosis is noted with perinephric stranding without definite evidence of obstructing calculus. Urinary bladder is decompressed. There does appear  to be a small calculus in dependent portion of urinary bladder suggesting recently passed stone. GI AND BOWEL: Stomach demonstrates no acute abnormality. There is no bowel obstruction. Diverticulosis of descending and sigmoid colon is noted without inflammation. PERITONEUM AND RETROPERITONEUM: No ascites. No free air. VASCULATURE: Aorta is normal in caliber. LYMPH NODES: No lymphadenopathy. REPRODUCTIVE ORGANS: No acute abnormality. BONES AND SOFT TISSUES: No acute osseous abnormality. No focal soft tissue abnormality. IMPRESSION: 1. Mild left hydroureteronephrosis with perinephric stranding, without definite evidence of an obstructing calculus. 2. Small nonobstructive right renal calculus. 3. Small calculus in the dependent portion of the urinary bladder, suggesting a recently passed stone. 4. Colonic diverticulosis without evidence of diverticulitis. Electronically signed by: Lynwood Seip MD 12/11/2023 05:53 PM EDT RP Workstation: [id number]    Microbiology: Results for orders placed or performed in visit on 09/26/22  Urine Culture     Status: Abnormal   Collection Time: 09/26/22  1:51 PM  Result Value Ref Range Status   MICRO NUMBER: [id number]  Final   SPECIMEN QUALITY: Adequate  Final   Sample Source URINE  Final   STATUS: FINAL  Final   ISOLATE 1: Proteus mirabilis (A)  Final    Comment: Greater than 100,000 CFU/mL of Proteus mirabilis This organism may show imipenem resistance by mechanisms other than a carbapenemase.      Susceptibility   Proteus mirabilis - URINE CULTURE, REFLEX    AMOX/CLAVULANIC <=2 Sensitive  AMPICILLIN <=2 Sensitive     AMPICILLIN/SULBACTAM <=2 Sensitive     CEFAZOLIN * <=4 Not Reportable      * For infections other than uncomplicated UTI caused by E. coli, K. pneumoniae or P. mirabilis: Cefazolin  is resistant if MIC > or = 8 mcg/mL. (Distinguishing susceptible versus intermediate for isolates with MIC < or = 4 mcg/mL requires additional testing.) For  uncomplicated UTI caused by E. coli, K. pneumoniae or P. mirabilis: Cefazolin  is susceptible if MIC <32 mcg/mL and predicts susceptible to the oral agents cefaclor, cefdinir, cefpodoxime, cefprozil, cefuroxime, cephalexin  and loracarbef.     CEFTAZIDIME <=1 Sensitive     CEFEPIME <=1 Sensitive     CEFTRIAXONE  <=1 Sensitive     CIPROFLOXACIN  <=0.25 Sensitive     LEVOFLOXACIN  <=0.12 Sensitive     GENTAMICIN <=1 Sensitive     IMIPENEM 2 Intermediate     NITROFURANTOIN  128 Resistant     PIP/TAZO <=4 Sensitive     TOBRAMYCIN <=1 Sensitive     TRIMETH /SULFA * <=20 Sensitive      * For infections other than uncomplicated UTI caused by E. coli, K. pneumoniae or P. mirabilis: Cefazolin  is resistant if MIC > or = 8 mcg/mL. (Distinguishing susceptible versus intermediate for isolates with MIC < or = 4 mcg/mL requires additional testing.) For uncomplicated UTI caused by E. coli, K. pneumoniae or P. mirabilis: Cefazolin  is susceptible if MIC <32 mcg/mL and predicts susceptible to the oral agents cefaclor, cefdinir, cefpodoxime, cefprozil, cefuroxime, cephalexin  and loracarbef. Legend: S = Susceptible  I = Intermediate R = Resistant  NS = Not susceptible * = Not tested  NR = Not reported **NN = See antimicrobic comments     Labs: CBC: Recent Labs  Lab 12/22/23 0532 12/24/23 0455 12/26/23 0059  WBC 7.8 10.2 7.3  HGB 12.1 14.1 11.3*  HCT 37.4 42.1 34.7*  MCV 88.8 86.4 88.7  PLT 153 203 158   Basic Metabolic Panel: Recent Labs  Lab 12/22/23 0918 12/23/23 0903 12/24/23 0455 12/25/23 0451 12/26/23 0059 12/27/23 0108 12/28/23 0114  NA  --    < > 141 138 139 140 141  K  --    < > 4.6 3.7 3.3* 4.2 4.1  CL  --    < > 105 104 107 110 107  CO2  --    < > 25 24 24 22 23   GLUCOSE  --    < > 129* 83 105* 112* 88  BUN  --    < > 16 13 6* 6* 11  CREATININE  --    < > 0.64 0.59 0.52 0.54 0.56  CALCIUM   --    < > 10.7* 9.6 9.2 9.2 9.7  MG 1.8  --  1.6* 1.7 2.1 2.0  --   PHOS 2.8   --   --  3.3 2.8 2.9  --    < > = values in this interval not displayed.   Liver Function Tests: Recent Labs  Lab 12/25/23 0451 12/26/23 0059  AST 45* 44*  ALT 35 46*  ALKPHOS 76 78  BILITOT 0.8 0.5  PROT 5.9* 5.7*  ALBUMIN 3.3* 3.4*   CBG: Recent Labs  Lab 12/25/23 2351 12/26/23 0745 12/26/23 1548 12/26/23 2317 12/27/23 0736  GLUCAP 139* 119* 112* 127* 123*    Discharge time spent: greater than 30 minutes.  Signed: Owen DELENA Lore, MD [facility] 12/28/2023

## 2023-12-31 ENCOUNTER — Telehealth: Payer: Self-pay | Admitting: *Deleted

## 2023-12-31 NOTE — Transitions of Care (Post Inpatient/ED Visit) (Signed)
   12/31/2023  Name: [name] Walker MRN: [id number] DOB: 01-30-1961  Today's TOC FU Call Status: Today's TOC FU Call Status:: Unsuccessful Call (1st Attempt) Unsuccessful Call (1st Attempt) Date: 12/31/23  Attempted to reach the patient regarding the most recent Inpatient visit.  Left HIPAA compliant voice message  Follow Up Plan: Additional outreach attempts will be made to reach the patient to complete the Transitions of Care (Post Inpatient/ED visit) call.   Pls call/ message for questions,  Nyx Keady Mckinney Nijae Doyel, RN, BSN, CCRN Alumnus RN Care Manager  Transitions of Care  [facility] [phone]: direct office

## 2024-01-01 NOTE — Telephone Encounter (Unsigned)
 Copied from CRM #[id number]. Topic: General - Other >> Jan 01, 2024  2:50 PM Franky GRADE wrote: Reason for CRM: Nurse Case Manager with [facility] is calling because she spoke with patient who informed patient was admitted on 12/16/2023-12/28/2023 for a small bowel obstruction and has not seen Dr.John for a hospital follow up.

## 2024-01-04 ENCOUNTER — Telehealth: Payer: Self-pay

## 2024-01-04 ENCOUNTER — Telehealth: Payer: Self-pay | Admitting: *Deleted

## 2024-01-04 NOTE — Telephone Encounter (Signed)
 Copied from CRM [id number]. Topic: General - Other >> Jan 04, 2024  1:02 PM China J wrote: Reason for CRM: Patient is returning a call from Laine Tousey, [facility]. Patient has already scheduled her hospital follow up with the medical assistant.

## 2024-01-04 NOTE — Telephone Encounter (Signed)
 Called and got Pt scheduled for Hospital follow up on 01/11/24 at 10:40am.

## 2024-01-04 NOTE — Transitions of Care (Post Inpatient/ED Visit) (Signed)
   01/04/2024  Name: [name] Walker MRN: [id number] DOB: [birth year]  Today's TOC FU Call Status: Today's TOC FU Call Status:: Unsuccessful Call (2nd Attempt) Unsuccessful Call (2nd Attempt) Date: 01/04/24  Attempted to reach the patient regarding the most recent Inpatient visit.  Left HIPAA compliant voice message - explained to patient in message we may attempt another outreach next week as- if allowed  Follow Up Plan: No further outreach attempts will be made at this time. We have been unable to contact the patient.  Pls call/ message for questions,  Nicco Reaume Mckinney Sanye Ledesma, RN, BSN, CCRN Alumnus RN Care Manager  Transitions of Care  [facility] [phone]: direct office

## 2024-01-10 ENCOUNTER — Encounter: Admitting: Physical Medicine & Rehabilitation

## 2024-01-11 ENCOUNTER — Encounter: Payer: Self-pay | Admitting: Internal Medicine

## 2024-01-11 ENCOUNTER — Ambulatory Visit (INDEPENDENT_AMBULATORY_CARE_PROVIDER_SITE_OTHER): Admitting: Internal Medicine

## 2024-01-11 VITALS — BP 126/74 | HR 65 | Temp 98.6°F | Ht 60.0 in | Wt 143.0 lb

## 2024-01-11 DIAGNOSIS — Z87898 Personal history of other specified conditions: Secondary | ICD-10-CM

## 2024-01-11 DIAGNOSIS — R1084 Generalized abdominal pain: Secondary | ICD-10-CM

## 2024-01-11 DIAGNOSIS — M5431 Sciatica, right side: Secondary | ICD-10-CM | POA: Diagnosis not present

## 2024-01-11 MED ORDER — PREDNISONE 10 MG PO TABS: ORAL_TABLET | ORAL | 0 refills | Status: AC

## 2024-01-11 NOTE — Patient Instructions (Signed)
 Please take all new medication as prescribed  - the prednisone  for the sciatica to try  Please continue all other medications as before, and refills have been done if requested.  Please have the pharmacy call with any other refills you may need.  Please continue your efforts at being more active, low cholesterol diet, and weight control.  Please keep your appointments with your specialists as you may have planned  Please make an Appointment to return in 6 months, or sooner if needed

## 2024-01-11 NOTE — Assessment & Plan Note (Signed)
 Markedly improved without worsening post discharge, denies all symptoms and no new complaints,  to f/u any worsening symptoms or concerns

## 2024-01-11 NOTE — Progress Notes (Signed)
 Patient ID: [name] Walker, female   DOB: Mar 27, 1960, 63 y.o.   MRN: [id number]        Chief Complaint: follow up post hospn oct 19 - oct 31 with SBO, also with right sciatica       HPI:  [name] Walker is a 63 y.o. female here with son overall doing well.  Denies worsening reflux, abd pain, dysphagia, n/v, bowel change or blood.  Pt denies chest pain, increased sob or doe, wheezing, orthopnea, PND, increased LE swelling, palpitations, dizziness or syncope.   Pt denies polydipsia, polyuria, or new focal neuro s/s.   No other new complaints except for new onset 3 days right sciatica mod to severe intermittent but persistent, worse with sitting, and asks for prednisone  that helped in the past.  Does not need lab or f/u appts today       Wt Readings from Last 3 Encounters:  01/11/24 143 lb (64.9 kg)  12/26/23 158 lb 1.1 oz (71.7 kg)  12/07/23 154 lb (69.9 kg)   BP Readings from Last 3 Encounters:  01/11/24 126/74  12/28/23 106/76  12/11/23 130/88         Past Medical History:  Diagnosis Date   ACHILLES TENDINITIS    ALLERGIC RHINITIS    Allergy     Anginal pain 04/04/2016   Pt currently feels like she is having muscle spasms and aching in her chest   Anxiety    Asthma    Chronic kidney disease    Depression    Eczema    GERD    History of kidney stones    Hyperlipidemia    HYPERTENSION    Kidney stone    LATERAL EPICONDYLITIS, RIGHT    LOW BACK PAIN    Plantar fascial fibromatosis    Sciatica    Stress    Stroke (HCC)    SUBACROMIAL BURSITIS, RIGHT    Thoracic scoliosis 10/01/2015   TIA (transient ischemic attack)    Past Surgical History:  Procedure Laterality Date   ANTERIOR CERVICAL DECOMP/DISCECTOMY FUSION N/A 04/07/2016   Procedure: Cervical two-three Anterior cervical decompression/discectomy/fusion;  Surgeon: Rockey Peru, MD;  Location: [facility] OR;  Service: Neurosurgery;  Laterality: N/A;   BACK SURGERY  2015   lower back   CYSTOSCOPY/URETEROSCOPY/HOLMIUM  LASER/STENT PLACEMENT Right 11/21/2022   Procedure: CYSTOSCOPY RIGHT RETROGRADE PYELOGRAM RIGHT URETEROSCOPY/HOLMIUM LASER/STENT PLACEMENT;  Surgeon: Nieves Cough, MD;  Location: [facility] OR[facility];  Service: Urology;  Laterality: Right;  60 MINS FOR CASE   EXTRACORPOREAL SHOCK WAVE LITHOTRIPSY Left 09/28/2016   Procedure: LEFT EXTRACORPOREAL SHOCK WAVE LITHOTRIPSY (ESWL);  Surgeon: Nieves Cough, MD;  Location: [facility];  Service: Urology;  Laterality: Left;   EXTRACORPOREAL SHOCK WAVE LITHOTRIPSY Right 04/08/2018   Procedure: EXTRACORPOREAL SHOCK WAVE LITHOTRIPSY (ESWL);  Surgeon: Cam Morene ORN, MD;  Location: [facility];  Service: Urology;  Laterality: Right;   KIDNEY SURGERY     LAPAROTOMY N/A 12/17/2023   Procedure: LAPAROTOMY, EXPLORATORY;  Surgeon: Aron Shoulders, MD;  Location: [facility];  Service: General;  Laterality: N/A;   LITHOTRIPSY  05/2016   TUBAL LIGATION      reports that she has been smoking cigarettes. She has a 10 pack-year smoking history. She has never been exposed to tobacco smoke. She has never used smokeless tobacco. She reports that she does not drink alcohol and does not use drugs. family history includes Alzheimer's disease in her mother; Breast cancer in her maternal aunt; Colon cancer in her father and sister; Dementia in  her mother; Diabetes in her mother; Prostate cancer in her father. Allergies  Allergen Reactions   Flexeril  [Cyclobenzaprine ] Swelling and Palpitations    Tongue swells   Other Palpitations    All muscle relaxers cause palpitations   Robaxin  [Methocarbamol ] Swelling and Palpitations    Tongue swelling   Zestril  [Lisinopril ] Swelling    Tongue swelling    Contrast Media [Iodinated Contrast Media] Itching   Iodine Itching   Lyrica [Pregabalin] Anxiety   Neurontin  [Gabapentin ] Other (See Comments)    Unknown reaction   Current Outpatient Medications on File Prior to Visit  Medication Sig Dispense Refill   LORazepam  (ATIVAN ) 1 MG tablet Take 1 mg by  mouth 2 (two) times daily as needed.     acetaminophen  (TYLENOL ) 500 MG tablet Take 2 tablets (1,000 mg total) by mouth every 6 (six) hours as needed for mild pain (pain score 1-3), headache or fever.     [Paused] acetaminophen -codeine  (TYLENOL  #4) 300-60 MG tablet Take 1 tablet by mouth every 4 (four) hours as needed for moderate pain (pain score 4-6). 60 tablet 2   albuterol  (VENTOLIN  HFA) 108 (90 Base) MCG/ACT inhaler INHALE 2 PUFFS BY MOUTH EVERY 6 HOURS AS NEEDED FOR WHEEZING FOR SHORTNESS OF BREATH 18 g 0   alendronate  (FOSAMAX ) 70 MG tablet Take 1 tablet (70 mg total) by mouth every 7 (seven) days. Take with a full glass of water on an empty stomach. (Patient taking differently: Take 70 mg by mouth every Saturday.) 12 tablet 3   amLODipine  (NORVASC ) 5 MG tablet Take 1 tablet (5 mg total) by mouth daily. 90 tablet 3   aspirin  EC 81 MG tablet Take 1 tablet (81 mg total) by mouth daily. Swallow whole. 30 tablet 11   atorvastatin  (LIPITOR) 20 MG tablet Take 1 tablet (20 mg total) by mouth daily. 90 tablet 3   Cholecalciferol (THERA-D 2000) 50 MCG (2000 UT) TABS 1 tab by mouth once daily 30 tablet 99   citalopram  (CELEXA ) 40 MG tablet Take 1 tablet (40 mg total) by mouth daily. 90 tablet 3   fluticasone  (FLONASE ) 50 MCG/ACT nasal spray Place 2 sprays into both nostrils daily. (Patient taking differently: Place 1-2 sprays into both nostrils daily as needed for allergies.) 16 g 0   fluticasone -salmeterol (ADVAIR DISKUS) 250-50 MCG/ACT AEPB Inhale 1 puff into the lungs daily. Can increase to twice daily during respiratory flares. 60 each 5   levocetirizine (XYZAL ) 5 MG tablet Take 1 tablet (5 mg total) by mouth every evening. 90 tablet 1   lidocaine  4 % Place 1 patch onto the skin daily as needed (pain).     losartan  (COZAAR ) 50 MG tablet Take 1 tablet by mouth once daily 90 tablet 3   nicotine  (NICODERM CQ  - DOSED IN MG/24 HOURS) 14 mg/24hr patch Place 1 patch (14 mg total) onto the skin daily as  needed (smoking cessation). 28 patch 0   oxyCODONE  (OXY IR/ROXICODONE ) 5 MG immediate release tablet Take 1-2 tablets (5-10 mg total) by mouth every 4 (four) hours as needed for moderate pain (pain score 4-6) or severe pain (pain score 7-10). 30 tablet 0   pantoprazole  (PROTONIX ) 40 MG tablet TAKE 1 TABLET BY MOUTH TWICE DAILY BEFORE A MEAL 180 tablet 3   polyethylene glycol (MIRALAX / GLYCOLAX) 17 g packet Take 17 g by mouth daily as needed for mild constipation.     traZODone  (DESYREL ) 100 MG tablet TAKE 1 TABLET BY MOUTH AT BEDTIME 90 tablet 0  No current facility-administered medications on file prior to visit.        ROS:  All others reviewed and negative.  Objective        PE:  BP 126/74 (BP Location: Left Arm, Patient Position: Sitting, Cuff Size: Normal)   Pulse 65   Temp 98.6 F (37 C) (Oral)   Ht 5' (1.524 m)   Wt 143 lb (64.9 kg)   LMP 08/22/2010   SpO2 99%   BMI 27.93 kg/m                 Constitutional: Pt appears in NAD               HENT: Head: NCAT.                Right Ear: External ear normal.                 Left Ear: External ear normal.                Eyes: . Pupils are equal, round, and reactive to light. Conjunctivae and EOM are normal               Nose: without d/c or deformity               Neck: Neck supple. Gross normal ROM               Cardiovascular: Normal rate and regular rhythm.                 Pulmonary/Chest: Effort normal and breath sounds without rales or wheezing.                Abd:  Soft, NT, ND, + BS, no organomegaly               Neurological: Pt is alert. At baseline orientation, motor grossly intact               Skin: Skin is warm. No rashes, no other new lesions, LE edema - none               Psychiatric: Pt behavior is normal without agitation   Micro: none  Cardiac tracings I have personally interpreted today:  none  Pertinent Radiological findings (summarize): none   Lab Results  Component Value Date   WBC 7.3 12/26/2023    HGB 11.3 (L) 12/26/2023   HCT 34.7 (L) 12/26/2023   PLT 158 12/26/2023   GLUCOSE 88 12/28/2023   CHOL 170 07/28/2022   TRIG 203.0 (H) 07/28/2022   HDL 40.70 07/28/2022   LDLDIRECT 103.0 07/28/2022   LDLCALC 46 06/30/2020   ALT 46 (H) 12/26/2023   AST 44 (H) 12/26/2023   NA 141 12/28/2023   K 4.1 12/28/2023   CL 107 12/28/2023   CREATININE 0.56 12/28/2023   BUN 11 12/28/2023   CO2 23 12/28/2023   TSH 0.43 07/28/2022   INR 0.9 09/30/2019   HGBA1C 5.0 07/28/2022   Assessment/Plan:  [name] Walker is a 63 y.o. Black or African American [2] female with  has a past medical history of ACHILLES TENDINITIS, ALLERGIC RHINITIS, Allergy , Anginal pain (04/04/2016), Anxiety, Asthma, Chronic kidney disease, Depression, Eczema, GERD, History of kidney stones, Hyperlipidemia, HYPERTENSION, Kidney stone, LATERAL EPICONDYLITIS, RIGHT, LOW BACK PAIN, Plantar fascial fibromatosis, Sciatica, Stress, Stroke (HCC), SUBACROMIAL BURSITIS, RIGHT, Thoracic scoliosis (10/01/2015), and TIA (transient ischemic attack).  Generalized abdominal pain Markedly improved without worsening post discharge, denies all symptoms  and no new complaints,  to f/u any worsening symptoms or concerns  Right sided sciatica Pt with recent recurrent onset mod to severe pain post discharge , has not been able to tolerate gabapentin  or lyrica in past, ok for prednisone  taper, consider f/u with sports medicine  Followup: Return in about 6 months (around 07/10/2024).  Lynwood Rush, MD 01/11/2024 11:58 AM [facility]

## 2024-01-11 NOTE — Assessment & Plan Note (Signed)
 Pt with recent recurrent onset mod to severe pain post discharge , has not been able to tolerate gabapentin  or lyrica in past, ok for prednisone  taper, consider f/u with sports medicine

## 2024-01-15 ENCOUNTER — Other Ambulatory Visit: Payer: Self-pay | Admitting: Internal Medicine

## 2024-01-17 ENCOUNTER — Ambulatory Visit: Admitting: Allergy & Immunology

## 2024-03-06 ENCOUNTER — Other Ambulatory Visit: Payer: Self-pay | Admitting: Internal Medicine

## 2024-03-06 MED ORDER — LORAZEPAM 1 MG PO TABS: 1.0000 mg | ORAL_TABLET | Freq: Two times a day (BID) | ORAL | 2 refills | Status: AC | PRN

## 2024-03-06 NOTE — Telephone Encounter (Signed)
 Ok this can be done thanks

## 2024-03-06 NOTE — Telephone Encounter (Signed)
 Copied from CRM #[id number]. Topic: Clinical - Medication Refill >> Mar 06, 2024  9:30 AM Darshell M wrote: Medication:  LORazepam  (ATIVAN ) 1 MG tablet  - Patient says she takes 2 per day   Has the patient contacted their pharmacy? Yes (Agent: If no, request that the patient contact the pharmacy for the refill. If patient does not wish to contact the pharmacy document the reason why and proceed with request.) (Agent: If yes, when and what did the pharmacy advise?)  This is the patient's preferred pharmacy:  [facility] [address] [place], [place] - [address] [place] [address] Phone: [phone] Fax: [phone]   Is this the correct pharmacy for this prescription? Yes If no, delete pharmacy and type the correct one.   Has the prescription been filled recently? No  Is the patient out of the medication? Yes  Has the patient been seen for an appointment in the last year OR does the patient have an upcoming appointment? Yes  Can we respond through MyChart? Yes  Agent: Please be advised that Rx refills may take up to 3 business days. We ask that you follow-up with your pharmacy.

## 2024-03-06 NOTE — Telephone Encounter (Signed)
 LORazepam  (ATIVAN ) 1 MG tablet - Patient says she takes 2 per day

## 2024-03-12 ENCOUNTER — Emergency Department (HOSPITAL_COMMUNITY)

## 2024-03-12 ENCOUNTER — Emergency Department (HOSPITAL_COMMUNITY)
Admission: EM | Admit: 2024-03-12 | Discharge: 2024-03-13 | Discharge status: Against Medical Advice | Attending: Emergency Medicine | Admitting: Emergency Medicine

## 2024-03-12 ENCOUNTER — Ambulatory Visit: Payer: Self-pay

## 2024-03-12 DIAGNOSIS — R109 Unspecified abdominal pain: Secondary | ICD-10-CM | POA: Diagnosis present

## 2024-03-12 DIAGNOSIS — Z5329 Procedure and treatment not carried out because of patient's decision for other reasons: Secondary | ICD-10-CM | POA: Diagnosis not present

## 2024-03-12 LAB — CBC WITH DIFFERENTIAL/PLATELET
Abs Immature Granulocytes: 0.01 K/uL (ref 0.00–0.07)
Basophils Absolute: 0 K/uL (ref 0.0–0.1)
Basophils Relative: 1 %
Eosinophils Absolute: 0.2 K/uL (ref 0.0–0.5)
Eosinophils Relative: 3 %
HCT: 44.9 % (ref 36.0–46.0)
Hemoglobin: 14.5 g/dL (ref 12.0–15.0)
Immature Granulocytes: 0 %
Lymphocytes Relative: 52 %
Lymphs Abs: 3.2 K/uL (ref 0.7–4.0)
MCH: 29 pg (ref 26.0–34.0)
MCHC: 32.3 g/dL (ref 30.0–36.0)
MCV: 89.8 fL (ref 80.0–100.0)
Monocytes Absolute: 0.6 K/uL (ref 0.1–1.0)
Monocytes Relative: 9 %
Neutro Abs: 2.2 K/uL (ref 1.7–7.7)
Neutrophils Relative %: 35 %
Platelets: 161 K/uL (ref 150–400)
RBC: 5 MIL/uL (ref 3.87–5.11)
RDW: 13.9 % (ref 11.5–15.5)
WBC: 6.2 K/uL (ref 4.0–10.5)
nRBC: 0 % (ref 0.0–0.2)

## 2024-03-12 LAB — COMPREHENSIVE METABOLIC PANEL WITH GFR
ALT: 21 U/L (ref 0–44)
AST: 28 U/L (ref 15–41)
Albumin: 4.2 g/dL (ref 3.5–5.0)
Alkaline Phosphatase: 98 U/L (ref 38–126)
Anion gap: 10 (ref 5–15)
BUN: 14 mg/dL (ref 8–23)
CO2: 24 mmol/L (ref 22–32)
Calcium: 10.3 mg/dL (ref 8.9–10.3)
Chloride: 109 mmol/L (ref 98–111)
Creatinine, Ser: 0.76 mg/dL (ref 0.44–1.00)
GFR, Estimated: >60 mL/min
Glucose, Bld: 97 mg/dL (ref 70–99)
Potassium: 3.9 mmol/L (ref 3.5–5.1)
Sodium: 143 mmol/L (ref 135–145)
Total Bilirubin: 0.3 mg/dL (ref 0.0–1.2)
Total Protein: 7.3 g/dL (ref 6.5–8.1)

## 2024-03-12 LAB — URINALYSIS, ROUTINE W REFLEX MICROSCOPIC
Bilirubin Urine: NEGATIVE
Glucose, UA: NEGATIVE mg/dL
Hgb urine dipstick: NEGATIVE
Ketones, ur: NEGATIVE mg/dL
Leukocytes,Ua: NEGATIVE
Nitrite: NEGATIVE
Protein, ur: NEGATIVE mg/dL
Specific Gravity, Urine: 1.025 (ref 1.005–1.030)
pH: 5 (ref 5.0–8.0)

## 2024-03-12 LAB — LIPASE, BLOOD: Lipase: 28 U/L (ref 11–51)

## 2024-03-12 NOTE — ED Triage Notes (Signed)
 LLQ pain starting two days ago. No other symptoms or urinary issues. Abd surgery last October.

## 2024-03-12 NOTE — Telephone Encounter (Signed)
 FYI Only or Action Required?: FYI only for provider: ED advised.  Patient was last seen in primary care on 01/11/2024 by Norleen Lynwood ORN, MD.  Called Nurse Triage reporting Abdominal Pain.  Symptoms began several days ago.  Interventions attempted: Rest, hydration, or home remedies.  Symptoms are: gradually worsening.  Triage Disposition: Go to ED Now (Notify PCP)  Patient/caregiver understands and will follow disposition?: No, refuses disposition Will go later when daughter gets off work this evening. Declines having this RN call 911    2 days ago onset of 8/10 pain in lower pelvic area. No nausea or emesis. Had hernia, had surgery to remove it in October. Hx of SBO. No pain with urination. Had BM this morning. No fever.   Triage cut short d/t severity of pt symptoms.  Advised ED. Will go later when daughter gets off work this evening. Declines having this RN call 911. Advised 911 for worsening symptoms.    Copied from CRM [id number]. Topic: Clinical - Red Word Triage >> Mar 12, 2024  2:15 PM Mesmerise C wrote: Red Word that prompted transfer to Nurse Triage: Patient has been having constant pain below her stomach that's been ongoing for 2 days now wanting to get xray done Reason for Disposition  [1] SEVERE pelvic pain AND [2] present > 1 hour  Answer Assessment - Initial Assessment Questions 1. LOCATION: Where does it hurt?      Lower abdomen/pelvis  2. RADIATION: Does the pain shoot anywhere else? (e.g., lower back, groin, thighs)     *No Answer*  3. ONSET: When did the pain begin? (e.g., minutes, hours or days ago)      2 days ago  4. SUDDEN: Gradual or sudden onset?     *No Answer*  5. PATTERN Does the pain come and go, or is it constant?     Constant  6. SEVERITY: How bad is the pain?  (e.g., Scale 1-10; mild, moderate, or severe)     8/10  7. RECURRENT SYMPTOM: Have you ever had this type of pelvic pain before? If Yes, ask: When was the last time?  and What happened that time?      *No Answer*  8. CAUSE: What do you think is causing the pelvic pain?     *No Answer*  9. RELIEVING/AGGRAVATING FACTORS: What makes it better or worse? (e.g., activity/rest, sexual intercourse, voiding, passing stool)     *No Answer*  10. OTHER SYMPTOMS: Has there been any other symptoms? (e.g., fever, constipation, diarrhea, urine problems, vaginal bleeding, vaginal discharge, or vomiting?       *No Answer*  Protocols used: Pelvic Pain - Female-A-AH

## 2024-03-12 NOTE — Telephone Encounter (Signed)
 Ok this is noted, no new orders

## 2024-03-12 NOTE — ED Provider Triage Note (Signed)
 Emergency Medicine Provider Triage Evaluation Note  [name] Walker , a 64 y.o. female  was evaluated in triage.  Pt complains of abdominal pain. Patient with LLQ pain. States pain has been ongoing for several days and remaining in the LLQ. No vomiting or diarrhea. Has had some nausea.  No history of diverticulitis but states that she had abdominal surgery several months ago.  Review of Systems  Positive: As above Negative: As above  Physical Exam  BP 118/75   Pulse 71   Temp 98.2 F (36.8 C) (Oral)   Resp 16   LMP 08/22/2010   SpO2 97%  Gen:   Awake, no distress Resp:  Normal effort MSK:   Moves extremities without difficulty  Other:  Left lower quadrant tenderness to palpation with guarding seen.  Rebound tenderness present.  Medical Decision Making  Medically screening exam initiated at 4:26 PM.  Appropriate orders placed.  [name] Walker was informed that the remainder of the evaluation will be completed by another provider, this initial triage assessment does not replace that evaluation, and the importance of remaining in the ED until their evaluation is complete.     Eulan Heyward A, PA-C 03/12/24 1628

## 2024-03-21 ENCOUNTER — Ambulatory Visit

## 2024-03-21 ENCOUNTER — Encounter: Admitting: Internal Medicine

## 2024-03-27 ENCOUNTER — Other Ambulatory Visit: Payer: Self-pay | Admitting: Nurse Practitioner

## 2024-03-27 DIAGNOSIS — M810 Age-related osteoporosis without current pathological fracture: Secondary | ICD-10-CM

## 2024-03-27 NOTE — Telephone Encounter (Signed)
 Med refill request: Alendronate  (FOSAMAX ) 70 MG tablet  Start:  12/05/22 Disp:  12 tablets Refills:  3  Prescribed: 70 mg, Oral, Every 7 days, Take with a full glass of water on an empty stomach. Patient taking differently: 70 mg Oral Every Sat, (No instructions reported),  Reason: Patient Preference, Informant: Self, Pharmacy Records, Reported on 12/17/2023   Last AEX:  12/05/22 Next AEX:  Not yet scheduled *Front desk has been asked to contact Pt to schedule an annual visit.  Last MMG (if hormonal med):  N/A Refill authorized? Please Advise.
